# Patient Record
Sex: Female | Born: 1955
Health system: Southern US, Community
[De-identification: ages and names within clinical notes are randomized; demographics above are authoritative.]

## PROBLEM LIST (undated history)

## (undated) DIAGNOSIS — M1990 Unspecified osteoarthritis, unspecified site: Secondary | ICD-10-CM

## (undated) DIAGNOSIS — I2699 Other pulmonary embolism without acute cor pulmonale: Secondary | ICD-10-CM

## (undated) DIAGNOSIS — K219 Gastro-esophageal reflux disease without esophagitis: Secondary | ICD-10-CM

## (undated) DIAGNOSIS — I10 Essential (primary) hypertension: Secondary | ICD-10-CM

## (undated) DIAGNOSIS — T7840XA Allergy, unspecified, initial encounter: Secondary | ICD-10-CM

## (undated) DIAGNOSIS — E785 Hyperlipidemia, unspecified: Secondary | ICD-10-CM

## (undated) DIAGNOSIS — Z8701 Personal history of pneumonia (recurrent): Secondary | ICD-10-CM

## (undated) HISTORY — DX: Hyperlipidemia, unspecified: E78.5

## (undated) HISTORY — DX: Allergy, unspecified, initial encounter: T78.40XA

## (undated) HISTORY — DX: Essential (primary) hypertension: I10

## (undated) HISTORY — DX: Gastro-esophageal reflux disease without esophagitis: K21.9

## (undated) HISTORY — DX: Unspecified osteoarthritis, unspecified site: M19.90

---
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
## 1992-05-02 HISTORY — PX: ANKLE GANGLION CYST EXCISION: SHX1148

## 1994-05-02 HISTORY — PX: UMBILICAL HERNIA REPAIR: SHX196

## 1994-05-02 HISTORY — PX: TUBAL LIGATION: SHX77

## 1996-05-02 HISTORY — PX: BACK SURGERY: SHX140

## 2001-05-23 ENCOUNTER — Encounter: Admission: RE | Admit: 2001-05-23 | Discharge: 2001-05-23 | Payer: Self-pay

## 2001-05-30 ENCOUNTER — Encounter: Admission: RE | Admit: 2001-05-30 | Discharge: 2001-05-30 | Payer: Self-pay

## 2001-06-11 ENCOUNTER — Encounter: Admission: RE | Admit: 2001-06-11 | Discharge: 2001-06-11 | Payer: Self-pay | Admitting: Internal Medicine

## 2002-03-15 ENCOUNTER — Encounter: Admission: RE | Admit: 2002-03-15 | Discharge: 2002-03-15 | Payer: Self-pay | Admitting: Internal Medicine

## 2003-03-25 ENCOUNTER — Emergency Department (HOSPITAL_COMMUNITY): Admission: AD | Admit: 2003-03-25 | Discharge: 2003-03-25 | Payer: Self-pay | Admitting: Family Medicine

## 2006-03-02 ENCOUNTER — Ambulatory Visit: Payer: Self-pay | Admitting: Family Medicine

## 2006-03-20 ENCOUNTER — Ambulatory Visit: Payer: Self-pay | Admitting: Family Medicine

## 2006-05-17 ENCOUNTER — Other Ambulatory Visit: Admission: RE | Admit: 2006-05-17 | Discharge: 2006-05-17 | Payer: Self-pay | Admitting: Family Medicine

## 2006-05-17 ENCOUNTER — Ambulatory Visit: Payer: Self-pay | Admitting: Family Medicine

## 2006-05-17 ENCOUNTER — Encounter (INDEPENDENT_AMBULATORY_CARE_PROVIDER_SITE_OTHER): Payer: Self-pay | Admitting: Family Medicine

## 2006-05-23 ENCOUNTER — Ambulatory Visit (HOSPITAL_COMMUNITY): Admission: RE | Admit: 2006-05-23 | Discharge: 2006-05-23 | Payer: Self-pay | Admitting: Family Medicine

## 2006-06-02 ENCOUNTER — Encounter: Admission: RE | Admit: 2006-06-02 | Discharge: 2006-06-02 | Payer: Self-pay | Admitting: Internal Medicine

## 2006-06-14 ENCOUNTER — Ambulatory Visit: Payer: Self-pay | Admitting: Family Medicine

## 2006-06-21 ENCOUNTER — Ambulatory Visit: Payer: Self-pay | Admitting: Family Medicine

## 2006-08-08 ENCOUNTER — Ambulatory Visit: Payer: Self-pay | Admitting: Family Medicine

## 2006-10-23 ENCOUNTER — Ambulatory Visit: Payer: Self-pay | Admitting: Family Medicine

## 2007-01-29 ENCOUNTER — Ambulatory Visit: Payer: Self-pay | Admitting: Internal Medicine

## 2007-01-29 ENCOUNTER — Encounter (INDEPENDENT_AMBULATORY_CARE_PROVIDER_SITE_OTHER): Payer: Self-pay | Admitting: Nurse Practitioner

## 2007-01-29 LAB — CONVERTED CEMR LAB
Cholesterol: 158 mg/dL (ref 0–200)
HDL: 40 mg/dL (ref >39)
LDL Cholesterol: 94 mg/dL (ref 0–99)
Total CHOL/HDL Ratio: 4
Triglycerides: 118 mg/dL (ref <150)
VLDL: 24 mg/dL (ref 0–40)

## 2007-05-29 ENCOUNTER — Ambulatory Visit: Payer: Self-pay | Admitting: Internal Medicine

## 2007-11-29 ENCOUNTER — Emergency Department (HOSPITAL_COMMUNITY): Admission: EM | Admit: 2007-11-29 | Discharge: 2007-11-29 | Payer: Self-pay | Admitting: Family Medicine

## 2007-12-13 ENCOUNTER — Ambulatory Visit: Payer: Self-pay | Admitting: Internal Medicine

## 2007-12-13 LAB — CONVERTED CEMR LAB
ALT: <8 units/L (ref 0–35)
AST: 11 units/L (ref 0–37)
Albumin: 4.2 g/dL (ref 3.5–5.2)
Alkaline Phosphatase: 89 units/L (ref 39–117)
BUN: 19 mg/dL (ref 6–23)
CO2: 20 meq/L (ref 19–32)
Calcium: 10 mg/dL (ref 8.4–10.5)
Chloride: 102 meq/L (ref 96–112)
Cholesterol: 185 mg/dL (ref 0–200)
Creatinine, Ser: 0.92 mg/dL (ref 0.40–1.20)
Glucose, Bld: 181 mg/dL — ABNORMAL HIGH (ref 70–99)
HDL: 36 mg/dL — ABNORMAL LOW (ref >39)
LDL Cholesterol: 102 mg/dL — ABNORMAL HIGH (ref 0–99)
Potassium: 5.1 meq/L (ref 3.5–5.3)
Sodium: 140 meq/L (ref 135–145)
Total Bilirubin: 0.4 mg/dL (ref 0.3–1.2)
Total CHOL/HDL Ratio: 5.1
Total Protein: 7.4 g/dL (ref 6.0–8.3)
Triglycerides: 234 mg/dL — ABNORMAL HIGH (ref <150)
VLDL: 47 mg/dL — ABNORMAL HIGH (ref 0–40)

## 2008-05-10 ENCOUNTER — Emergency Department (HOSPITAL_COMMUNITY): Admission: EM | Admit: 2008-05-10 | Discharge: 2008-05-10 | Payer: Self-pay | Admitting: Family Medicine

## 2008-05-22 ENCOUNTER — Ambulatory Visit: Payer: Self-pay | Admitting: Internal Medicine

## 2008-06-11 ENCOUNTER — Ambulatory Visit: Payer: Self-pay | Admitting: Internal Medicine

## 2009-06-12 ENCOUNTER — Encounter (INDEPENDENT_AMBULATORY_CARE_PROVIDER_SITE_OTHER): Payer: Self-pay | Admitting: Adult Health

## 2009-06-12 ENCOUNTER — Ambulatory Visit: Payer: Self-pay | Admitting: Internal Medicine

## 2009-06-12 LAB — CONVERTED CEMR LAB
ALT: <8 units/L (ref 0–35)
AST: 15 units/L (ref 0–37)
Albumin: 4.1 g/dL (ref 3.5–5.2)
Alkaline Phosphatase: 77 units/L (ref 39–117)
BUN: 18 mg/dL (ref 6–23)
CO2: 24 meq/L (ref 19–32)
Calcium: 9.4 mg/dL (ref 8.4–10.5)
Chloride: 102 meq/L (ref 96–112)
Cholesterol: 152 mg/dL (ref 0–200)
Creatinine, Ser: 0.9 mg/dL (ref 0.40–1.20)
Glucose, Bld: 93 mg/dL (ref 70–99)
HDL: 34 mg/dL — ABNORMAL LOW (ref >39)
Hgb A1c MFr Bld: 6.5 % — ABNORMAL HIGH (ref 4.6–6.1)
LDL Cholesterol: 92 mg/dL (ref 0–99)
Potassium: 4.7 meq/L (ref 3.5–5.3)
Sodium: 138 meq/L (ref 135–145)
Total Bilirubin: 0.3 mg/dL (ref 0.3–1.2)
Total CHOL/HDL Ratio: 4.5
Total Protein: 7.4 g/dL (ref 6.0–8.3)
Triglycerides: 129 mg/dL (ref <150)
VLDL: 26 mg/dL (ref 0–40)

## 2009-06-19 ENCOUNTER — Encounter: Admission: RE | Admit: 2009-06-19 | Discharge: 2009-06-19 | Payer: Self-pay | Admitting: Internal Medicine

## 2009-09-09 ENCOUNTER — Ambulatory Visit: Payer: Self-pay | Admitting: Internal Medicine

## 2009-09-09 ENCOUNTER — Encounter (INDEPENDENT_AMBULATORY_CARE_PROVIDER_SITE_OTHER): Payer: Self-pay | Admitting: Adult Health

## 2009-09-09 LAB — CONVERTED CEMR LAB: Microalb, Ur: 0.5 mg/dL (ref 0.00–1.89)

## 2009-10-26 ENCOUNTER — Ambulatory Visit: Payer: Self-pay | Admitting: Family Medicine

## 2010-02-12 ENCOUNTER — Emergency Department (HOSPITAL_COMMUNITY): Admission: EM | Admit: 2010-02-12 | Discharge: 2010-02-12 | Payer: Self-pay | Admitting: Emergency Medicine

## 2010-04-20 ENCOUNTER — Encounter (INDEPENDENT_AMBULATORY_CARE_PROVIDER_SITE_OTHER): Payer: Self-pay | Admitting: *Deleted

## 2010-04-20 LAB — CONVERTED CEMR LAB
BUN: 19 mg/dL (ref 6–23)
CO2: 22 meq/L (ref 19–32)
Calcium: 9.4 mg/dL (ref 8.4–10.5)
Chloride: 106 meq/L (ref 96–112)
Creatinine, Ser: 0.81 mg/dL (ref 0.40–1.20)
Glucose, Bld: 102 mg/dL — ABNORMAL HIGH (ref 70–99)
Potassium: 4.9 meq/L (ref 3.5–5.3)
Sodium: 140 meq/L (ref 135–145)

## 2010-06-27 ENCOUNTER — Other Ambulatory Visit: Payer: Self-pay | Admitting: Internal Medicine

## 2010-06-29 NOTE — Telephone Encounter (Signed)
This is a [facility] pt not [facility], please deny all requests

## 2010-08-02 ENCOUNTER — Inpatient Hospital Stay (INDEPENDENT_AMBULATORY_CARE_PROVIDER_SITE_OTHER)
Admission: RE | Admit: 2010-08-02 | Discharge: 2010-08-02 | Disposition: A | Discharge status: Home / Self Care | Payer: Medicare Other | Source: Ambulatory Visit | Attending: Emergency Medicine | Admitting: Emergency Medicine

## 2010-08-02 DIAGNOSIS — Z760 Encounter for issue of repeat prescription: Secondary | ICD-10-CM

## 2010-08-02 DIAGNOSIS — R6889 Other general symptoms and signs: Secondary | ICD-10-CM

## 2011-03-11 ENCOUNTER — Other Ambulatory Visit (HOSPITAL_COMMUNITY)
Admission: RE | Admit: 2011-03-11 | Discharge: 2011-03-11 | Disposition: A | Discharge status: Home / Self Care | Payer: Medicare Other | Source: Ambulatory Visit | Attending: Family Medicine | Admitting: Family Medicine

## 2011-03-11 ENCOUNTER — Other Ambulatory Visit: Payer: Self-pay | Admitting: Family Medicine

## 2011-03-11 ENCOUNTER — Other Ambulatory Visit (HOSPITAL_COMMUNITY): Payer: Self-pay | Admitting: Family Medicine

## 2011-03-11 DIAGNOSIS — Z1231 Encounter for screening mammogram for malignant neoplasm of breast: Secondary | ICD-10-CM

## 2011-03-11 DIAGNOSIS — Z124 Encounter for screening for malignant neoplasm of cervix: Secondary | ICD-10-CM | POA: Insufficient documentation

## 2011-03-14 ENCOUNTER — Encounter: Payer: Self-pay | Admitting: Gastroenterology

## 2011-03-21 ENCOUNTER — Ambulatory Visit (AMBULATORY_SURGERY_CENTER): Payer: Medicare Other | Admitting: *Deleted

## 2011-03-21 VITALS — Ht 66.5 in | Wt 224.0 lb

## 2011-03-21 DIAGNOSIS — Z1211 Encounter for screening for malignant neoplasm of colon: Secondary | ICD-10-CM

## 2011-03-21 MED ORDER — PEG-KCL-NACL-NASULF-NA ASC-C 100 G PO SOLR: ORAL | Status: DC

## 2011-03-30 ENCOUNTER — Encounter: Payer: Self-pay | Admitting: Gastroenterology

## 2011-03-30 ENCOUNTER — Ambulatory Visit (AMBULATORY_SURGERY_CENTER): Payer: Medicare Other | Admitting: Gastroenterology

## 2011-03-30 VITALS — BP 146/77 | HR 78 | Temp 97.9°F | Resp 18 | Ht 66.5 in | Wt 224.0 lb

## 2011-03-30 DIAGNOSIS — K5289 Other specified noninfective gastroenteritis and colitis: Secondary | ICD-10-CM

## 2011-03-30 DIAGNOSIS — Z1211 Encounter for screening for malignant neoplasm of colon: Secondary | ICD-10-CM

## 2011-03-30 DIAGNOSIS — R933 Abnormal findings on diagnostic imaging of other parts of digestive tract: Secondary | ICD-10-CM

## 2011-03-30 DIAGNOSIS — K633 Ulcer of intestine: Secondary | ICD-10-CM

## 2011-03-30 LAB — GLUCOSE, CAPILLARY: Glucose-Capillary: 191 mg/dL — ABNORMAL HIGH (ref 70–99)

## 2011-03-30 MED ORDER — SODIUM CHLORIDE 0.9 % IV SOLN: 500.0000 mL | INTRAVENOUS | Status: DC

## 2011-03-30 NOTE — Patient Instructions (Signed)
FOLLOW THE INSTRUCTIONS ON THE GREEN AND BLUE POST PROCEDURE INSTRUCTION SHEETS.  CONTINUE YOUR MEDICATIONS.  AWAIT BIOPSY RESULTS.   WILL NEED A FOLLOW UP APPOINTMENT TO DISCUSS FINDINGS WITH DR. Christella Hartigan.

## 2011-03-30 NOTE — Progress Notes (Signed)
Patient did not experience any of the following events: a burn prior to discharge; a fall within the facility; wrong site/side/patient/procedure/implant event; or a hospital transfer or hospital admission upon discharge from the facility. (G8907) Patient did not have preoperative order for IV antibiotic SSI prophylaxis. (G8918)  

## 2011-03-31 ENCOUNTER — Telehealth: Payer: Self-pay | Admitting: *Deleted

## 2011-03-31 NOTE — Telephone Encounter (Signed)
No id on voicemail, no message left

## 2011-04-05 ENCOUNTER — Other Ambulatory Visit: Payer: Medicare Other | Admitting: Gastroenterology

## 2011-04-05 ENCOUNTER — Telehealth: Payer: Self-pay | Admitting: Gastroenterology

## 2011-04-05 NOTE — Telephone Encounter (Signed)
Patt please call her. Biopsies showed chronic inflammation of her colon. I will discuss this in more detail at her office visit in the end of December.  Natalie, no result note is needed

## 2011-04-05 NOTE — Telephone Encounter (Signed)
Left message on machine to call back  

## 2011-04-05 NOTE — Telephone Encounter (Signed)
Pt aware.

## 2011-04-12 ENCOUNTER — Ambulatory Visit (HOSPITAL_COMMUNITY)
Admission: RE | Admit: 2011-04-12 | Discharge: 2011-04-12 | Disposition: A | Discharge status: Home / Self Care | Payer: Medicare Other | Source: Ambulatory Visit | Attending: Family Medicine | Admitting: Family Medicine

## 2011-04-12 DIAGNOSIS — Z1231 Encounter for screening mammogram for malignant neoplasm of breast: Secondary | ICD-10-CM | POA: Insufficient documentation

## 2011-04-27 ENCOUNTER — Ambulatory Visit: Payer: Medicare Other | Admitting: Gastroenterology

## 2011-05-10 ENCOUNTER — Ambulatory Visit (INDEPENDENT_AMBULATORY_CARE_PROVIDER_SITE_OTHER): Payer: Medicare Other | Admitting: Gastroenterology

## 2011-05-10 ENCOUNTER — Encounter: Payer: Self-pay | Admitting: Gastroenterology

## 2011-05-10 VITALS — BP 110/70 | HR 83 | Ht 67.0 in | Wt 228.4 lb

## 2011-05-10 DIAGNOSIS — I10 Essential (primary) hypertension: Secondary | ICD-10-CM

## 2011-05-10 DIAGNOSIS — E1159 Type 2 diabetes mellitus with other circulatory complications: Secondary | ICD-10-CM | POA: Insufficient documentation

## 2011-05-10 DIAGNOSIS — R933 Abnormal findings on diagnostic imaging of other parts of digestive tract: Secondary | ICD-10-CM

## 2011-05-10 DIAGNOSIS — K219 Gastro-esophageal reflux disease without esophagitis: Secondary | ICD-10-CM

## 2011-05-10 NOTE — Progress Notes (Signed)
Review of pertinent gastrointestinal problems: 1. incidentally noted a sending colon, terminal ileal inflammation on colonoscopy done for routine screening November 2012. Biopsy showed active, chronic colitis but no inflammation in the terminal ileum microscopically. Likely NSAID related, she takes 4 Aleve a day   HPI: This is a  very pleasant 56 year old woman whom I last saw time of routine screening examination about 2 months ago. See those results summarized above.  Colonoscopy 2 months ago for routine screening.    She had alternating bowels for many years, but for the past 2 years she will go 2-3 times a week.  No real diarrhea.  She has intermittent epigastric discomforts, also left sided pains;  She will pass gas or have a BM and feels better.  She takes 4 alleve a day for many many years.  Tylenol doesn't help very much.  Severe knee and back pains.  Review of systems: Pertinent positive and negative review of systems were noted in the above HPI section. Complete review of systems was performed and was otherwise normal.    Past Medical History  Diagnosis Date  . Allergy     seasonal  . Arthritis   . Diabetes mellitus   . GERD (gastroesophageal reflux disease)   . Hyperlipidemia   . Hypertension     Past Surgical History  Procedure Date  . Ankle fracture surgery 1974    left and pinned then pins removed  . Ankle ganglion cyst excision 1994    right  . Back surgery 1998    discectomy and then fusion  . Umbilical hernia repair 1996  . Tubal ligation 1996  . Tonsillectomy 1972    Current Outpatient Prescriptions  Medication Sig Dispense Refill  . benazepril-hydrochlorthiazide (LOTENSIN HCT) 20-25 MG per tablet Take 1 tablet by mouth daily.        Marland Kitchen glimepiride (AMARYL) 2 MG tablet Take 2 mg by mouth daily before breakfast.        . [facility] Lanius CAPS Take 1 capsule by mouth daily.        . metFORMIN (GLUCOPHAGE) 850 MG tablet Take 850 mg by mouth 2 (two) times daily  with a meal.        . naproxen sodium (ANAPROX) 220 MG tablet Take 220 mg by mouth 2 (two) times daily with a meal.        . pravastatin (PRAVACHOL) 40 MG tablet Take 40 mg by mouth daily.        . traMADol (ULTRAM) 50 MG tablet Take 100 mg by mouth every 8 (eight) hours as needed. Maximum dose= 8 tablets per day         Allergies as of 05/10/2011  . (No Known Allergies)    Family History  Problem Relation Age of Onset  . Colon polyps Father   . Diabetes Father     mother  . Heart disease Father   . Heart murmur Mother     History   Social History  . Marital Status: Married    Spouse Name: N/A    Number of Children: 0  . Years of Education: N/A   Occupational History  . retired    Social History Main Topics  . Smoking status: Current Everyday Smoker -- 1.0 packs/day    Types: Cigarettes  . Smokeless tobacco: Never Used  . Alcohol Use: No  . Drug Use: No  . Sexually Active: Not on file   Other Topics Concern  . Not on file  Social History Narrative  . No narrative on file       Physical Exam: BP 110/70  Pulse 83  Ht 5\' 7"  (1.702 m)  Wt 228 lb 6.4 oz (103.602 kg)  BMI 35.77 kg/m2  SpO2 97% Constitutional: generally well-appearing Psychiatric: alert and oriented x3 Eyes: extraocular movements intact Mouth: oral pharynx moist, no lesions Neck: supple no lymphadenopathy Cardiovascular: heart regular rate and rhythm Lungs: clear to auscultation bilaterally Abdomen: soft, nontender, nondistended, no obvious ascites, no peritoneal signs, normal bowel sounds Extremities: no lower extremity edema bilaterally Skin: no lesions on visible extremities    Assessment and plan: 56 y.o. 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 She really has no symptoms of inflammatory bowel disease. She also told me she has intermittent GERD. Her chronic cigarette  smoking is probably playing a role and her NSAIDs also play a role. She is going to change the way she is taking her proton pump inhibitor.

## 2011-05-10 NOTE — Patient Instructions (Addendum)
One of your biggest health concerns is your smoking.  This increases your risk for most cancers and serious cardiovascular diseases such as strokes, heart attacks.  You should try your best to stop.  If you need assistance, please contact your PCP or Smoking Cessation Class at [facility] [phone]) or [facility] Quit-Line (1-800-QUIT-NOW). The alleve is probably irritating your colon, take as little as possible. You should change the way you are taking your antiacid medicine prilosec so that you are taking it 20-30 minutes prior to a decent meal as that is the way the pill is designed to work most effectively. Please start taking citrucel (orange flavored) powder fiber supplement.  This may cause some bloating at first but that usually goes away. Begin with a small spoonful and work your way up to a large, heaping spoonful daily over a week.

## 2012-02-02 ENCOUNTER — Other Ambulatory Visit (HOSPITAL_COMMUNITY): Payer: Self-pay | Admitting: Orthopedic Surgery

## 2012-02-03 ENCOUNTER — Encounter (HOSPITAL_COMMUNITY): Payer: Self-pay | Admitting: Pharmacy Technician

## 2012-02-07 ENCOUNTER — Encounter (HOSPITAL_COMMUNITY)
Admission: RE | Admit: 2012-02-07 | Discharge: 2012-02-07 | Disposition: A | Discharge status: Home / Self Care | Payer: Medicare Other | Source: Ambulatory Visit | Attending: Orthopedic Surgery | Admitting: Orthopedic Surgery

## 2012-02-07 ENCOUNTER — Encounter (HOSPITAL_COMMUNITY): Payer: Self-pay

## 2012-02-07 HISTORY — DX: Personal history of pneumonia (recurrent): Z87.01

## 2012-02-07 LAB — CBC
HCT: 36.9 % (ref 36.0–46.0)
MCH: 24.9 pg — ABNORMAL LOW (ref 26.0–34.0)
MCV: 81.5 fL (ref 78.0–100.0)
Platelets: 341 10*3/uL (ref 150–400)
RBC: 4.53 MIL/uL (ref 3.87–5.11)

## 2012-02-07 LAB — URINALYSIS, ROUTINE W REFLEX MICROSCOPIC
Glucose, UA: NEGATIVE mg/dL
Ketones, ur: NEGATIVE mg/dL
Leukocytes, UA: NEGATIVE
Nitrite: NEGATIVE
Specific Gravity, Urine: 1.022 (ref 1.005–1.030)
pH: 5 (ref 5.0–8.0)

## 2012-02-07 LAB — BASIC METABOLIC PANEL
CO2: 24 mEq/L (ref 19–32)
Calcium: 9.6 mg/dL (ref 8.4–10.5)
Chloride: 102 mEq/L (ref 96–112)
Creatinine, Ser: 1.05 mg/dL (ref 0.50–1.10)
Glucose, Bld: 124 mg/dL — ABNORMAL HIGH (ref 70–99)
Sodium: 137 mEq/L (ref 135–145)

## 2012-02-07 LAB — TYPE AND SCREEN

## 2012-02-07 NOTE — Pre-Procedure Instructions (Signed)
22 Kaitlin Cantrell  02/07/2012   Your procedure is scheduled on:  October 15  Report to [facility] at 05:30 AM.  Call this number if you have problems the morning of surgery: [phone]   Remember:   Do not eat or drink:After Midnight.  Take these medicines the morning of surgery with A SIP OF WATER: Hydrocodone (if needed), Omeprazole, Tramadol (if needed)   STOP Naproxen (aleve), Krill Oil, Aspirin After today   Do not wear jewelry, make-up or nail polish.  Do not wear lotions, powders, or perfumes. You may wear deodorant.  Do not shave 48 hours prior to surgery. Men may shave face and neck.  Do not bring valuables to the hospital.  Contacts, dentures or bridgework may not be worn into surgery.  Leave suitcase in the car. After surgery it may be brought to your room.  For patients admitted to the hospital, checkout time is 11:00 AM the day of discharge.   Special Instructions: Shower using CHG 2 nights before surgery and the night before surgery.  If you shower the day of surgery use CHG.  Use special wash - you have one bottle of CHG for all showers.  You should use approximately 1/3 of the bottle for each shower.   Please read over the following fact sheets that you were given: Pain Booklet, Coughing and Deep Breathing, Blood Transfusion Information, Total Joint Packet and MRSA Information

## 2012-02-08 LAB — URINE CULTURE: Colony Count: NO GROWTH

## 2012-02-08 NOTE — Consult Note (Addendum)
Anesthesia chart review: Patient is a 56 year old female scheduled for left total knee arthroplasty by Dr. August Saucer on 02/14/2012. History includes obesity, smoking, diabetes mellitus type 2, hyperlipidemia, GERD, arthritis, hypertension, history pneumonia.  PCP is listed as Dr. Norberto Sorenson of [facility], which is recently closed.    Labs noted.  PTT on the day of surgery.  CXR on 02/07/12 showed lungs clear.  EKG on 02/07/12 showed NSR, LAD, anteroseptal infarct (age undetermined).  Currently, there are no comparison EKGs available.  No CV symptoms were documented at her PAT visit.  I reviewed above with Anesthesiologist Dr. Katrinka Blazing who agrees that if patient is asymptomatic from a CV standpoint then would plan to proceed.  I left patient a voicemail to call me to clinically correlate, otherwise she will be evaluated by her assigned anesthesiologist on the day of surgery.   Shonna Chock, PA-C 02/08/12 1147  Addendum:  Patient called.  She denies chest pain and SOB at rest.  She has mild DOE with moderate activity such as going up a hill.  She moves slower due to her knee pain, but otherwise feels she is fairly active.  She is able to do her own shopping and recently was able to prepare a meal/clean-up for sixteen people.  She denies known cardiac history or cardiac testing such as stress or echo.  She is compliant with her medications is looking in to getting established with [facility] for primary care since [facility] closed.  Anticipate she can proceed.

## 2012-02-13 MED ORDER — CHLORHEXIDINE GLUCONATE 4 % EX LIQD: 60.0000 mL | Freq: Once | CUTANEOUS | Status: DC

## 2012-02-13 MED ORDER — CEFAZOLIN SODIUM-DEXTROSE 2-3 GM-% IV SOLR
2.0000 g | INTRAVENOUS | Status: DC
  Filled 2012-02-13: qty 50

## 2012-02-14 ENCOUNTER — Inpatient Hospital Stay (HOSPITAL_COMMUNITY)
Admission: RE | Admit: 2012-02-14 | Discharge: 2012-02-18 | DRG: 470 | Disposition: A | Discharge status: Home Health | Payer: Medicare Other | Source: Ambulatory Visit | Attending: Orthopedic Surgery | Admitting: Orthopedic Surgery

## 2012-02-14 ENCOUNTER — Encounter (HOSPITAL_COMMUNITY): Payer: Self-pay | Admitting: Vascular Surgery

## 2012-02-14 ENCOUNTER — Encounter (HOSPITAL_COMMUNITY)
Admission: RE | Disposition: A | Discharge status: Home Health | Payer: Self-pay | Source: Ambulatory Visit | Attending: Orthopedic Surgery

## 2012-02-14 ENCOUNTER — Encounter (HOSPITAL_COMMUNITY): Payer: Self-pay | Admitting: *Deleted

## 2012-02-14 ENCOUNTER — Inpatient Hospital Stay (HOSPITAL_COMMUNITY): Payer: Medicare Other | Admitting: Vascular Surgery

## 2012-02-14 DIAGNOSIS — F17200 Nicotine dependence, unspecified, uncomplicated: Secondary | ICD-10-CM | POA: Diagnosis present

## 2012-02-14 DIAGNOSIS — Z833 Family history of diabetes mellitus: Secondary | ICD-10-CM

## 2012-02-14 DIAGNOSIS — Z7982 Long term (current) use of aspirin: Secondary | ICD-10-CM

## 2012-02-14 DIAGNOSIS — E785 Hyperlipidemia, unspecified: Secondary | ICD-10-CM | POA: Diagnosis present

## 2012-02-14 DIAGNOSIS — E119 Type 2 diabetes mellitus without complications: Secondary | ICD-10-CM | POA: Diagnosis present

## 2012-02-14 DIAGNOSIS — I10 Essential (primary) hypertension: Secondary | ICD-10-CM | POA: Diagnosis present

## 2012-02-14 DIAGNOSIS — M1710 Unilateral primary osteoarthritis, unspecified knee: Principal | ICD-10-CM | POA: Diagnosis present

## 2012-02-14 DIAGNOSIS — M1711 Unilateral primary osteoarthritis, right knee: Secondary | ICD-10-CM | POA: Diagnosis present

## 2012-02-14 DIAGNOSIS — K219 Gastro-esophageal reflux disease without esophagitis: Secondary | ICD-10-CM | POA: Diagnosis present

## 2012-02-14 HISTORY — PX: KNEE ARTHROPLASTY: SHX992

## 2012-02-14 LAB — GLUCOSE, CAPILLARY
Glucose-Capillary: 140 mg/dL — ABNORMAL HIGH (ref 70–99)
Glucose-Capillary: 179 mg/dL — ABNORMAL HIGH (ref 70–99)
Glucose-Capillary: 158 mg/dL — ABNORMAL HIGH (ref 70–99)

## 2012-02-14 SURGERY — ARTHROPLASTY, KNEE, TOTAL, USING IMAGELESS COMPUTER-ASSISTED NAVIGATION
Anesthesia: General | Site: Knee | Laterality: Left | Wound class: Clean

## 2012-02-14 MED ORDER — HYDROMORPHONE 0.3 MG/ML IV SOLN
INTRAVENOUS | Status: DC
  Administered 2012-02-14: 3.9 mg via INTRAVENOUS
  Administered 2012-02-15: 01:00:00 via INTRAVENOUS
  Administered 2012-02-15: 5.3 mg via INTRAVENOUS
  Administered 2012-02-15: 08:00:00 via INTRAVENOUS
  Administered 2012-02-15: 3.8 mg via INTRAVENOUS
  Filled 2012-02-14 (×3): qty 25

## 2012-02-14 MED ORDER — METHOCARBAMOL 100 MG/ML IJ SOLN
500.0000 mg | Freq: Four times a day (QID) | INTRAVENOUS | Status: DC | PRN
  Administered 2012-02-14: 500 mg via INTRAVENOUS
  Filled 2012-02-14: qty 5

## 2012-02-14 MED ORDER — PATIENT'S GUIDE TO USING COUMADIN BOOK
Freq: Once | Status: DC
  Filled 2012-02-14: qty 1

## 2012-02-14 MED ORDER — LACTATED RINGERS IV SOLN
INTRAVENOUS | Status: DC | PRN
  Administered 2012-02-14 (×2): via INTRAVENOUS

## 2012-02-14 MED ORDER — NALOXONE HCL 0.4 MG/ML IJ SOLN
0.4000 mg | INTRAMUSCULAR | Status: DC | PRN
  Filled 2012-02-14: qty 1

## 2012-02-14 MED ORDER — VANCOMYCIN HCL IN DEXTROSE 1-5 GM/200ML-% IV SOLN
INTRAVENOUS | Status: AC
  Filled 2012-02-14: qty 200

## 2012-02-14 MED ORDER — LIDOCAINE HCL 4 % MT SOLN
OROMUCOSAL | Status: DC | PRN
  Administered 2012-02-14: 4 mL via TOPICAL

## 2012-02-14 MED ORDER — SODIUM CHLORIDE 0.9 % IJ SOLN: 9.0000 mL | INTRAMUSCULAR | Status: DC | PRN

## 2012-02-14 MED ORDER — METFORMIN HCL 850 MG PO TABS
850.0000 mg | ORAL_TABLET | Freq: Two times a day (BID) | ORAL | Status: DC
  Administered 2012-02-14 – 2012-02-18 (×8): 850 mg via ORAL
  Filled 2012-02-14 (×11): qty 1

## 2012-02-14 MED ORDER — DIPHENHYDRAMINE HCL 50 MG/ML IJ SOLN
12.5000 mg | Freq: Four times a day (QID) | INTRAMUSCULAR | Status: DC | PRN
  Administered 2012-02-14: 12.5 mg via INTRAVENOUS
  Filled 2012-02-14: qty 0.25
  Filled 2012-02-14: qty 1

## 2012-02-14 MED ORDER — MIDAZOLAM HCL 2 MG/2ML IJ SOLN
0.5000 mg | Freq: Once | INTRAMUSCULAR | Status: AC | PRN
  Administered 2012-02-14: 0.5 mg via INTRAVENOUS

## 2012-02-14 MED ORDER — BUPIVACAINE-EPINEPHRINE (PF) 0.5% -1:200000 IJ SOLN
INTRAMUSCULAR | Status: AC
  Filled 2012-02-14: qty 10

## 2012-02-14 MED ORDER — MIDAZOLAM HCL 2 MG/2ML IJ SOLN
INTRAMUSCULAR | Status: AC
  Administered 2012-02-14: 0.5 mg
  Filled 2012-02-14: qty 2

## 2012-02-14 MED ORDER — ACETAMINOPHEN 650 MG RE SUPP: 650.0000 mg | Freq: Four times a day (QID) | RECTAL | Status: DC | PRN

## 2012-02-14 MED ORDER — PROPOFOL 10 MG/ML IV BOLUS
INTRAVENOUS | Status: DC | PRN
  Administered 2012-02-14: 200 mg via INTRAVENOUS

## 2012-02-14 MED ORDER — DOCUSATE SODIUM 100 MG PO CAPS
100.0000 mg | ORAL_CAPSULE | Freq: Two times a day (BID) | ORAL | Status: DC
  Administered 2012-02-14 – 2012-02-18 (×9): 100 mg via ORAL
  Filled 2012-02-14 (×10): qty 1

## 2012-02-14 MED ORDER — WARFARIN - PHARMACIST DOSING INPATIENT: Freq: Every day | Status: DC

## 2012-02-14 MED ORDER — HYDROMORPHONE HCL PF 1 MG/ML IJ SOLN
0.2500 mg | INTRAMUSCULAR | Status: DC | PRN
  Administered 2012-02-14 (×3): 0.5 mg via INTRAVENOUS

## 2012-02-14 MED ORDER — HYDROCHLOROTHIAZIDE 25 MG PO TABS
25.0000 mg | ORAL_TABLET | Freq: Every day | ORAL | Status: DC
  Administered 2012-02-14 – 2012-02-18 (×4): 25 mg via ORAL
  Filled 2012-02-14 (×5): qty 1

## 2012-02-14 MED ORDER — POTASSIUM CHLORIDE IN NACL 20-0.9 MEQ/L-% IV SOLN
INTRAVENOUS | Status: AC
  Administered 2012-02-14: 20:00:00 via INTRAVENOUS
  Filled 2012-02-14 (×4): qty 1000

## 2012-02-14 MED ORDER — GLIMEPIRIDE 2 MG PO TABS
2.0000 mg | ORAL_TABLET | Freq: Two times a day (BID) | ORAL | Status: DC
  Administered 2012-02-14 – 2012-02-18 (×8): 2 mg via ORAL
  Filled 2012-02-14 (×11): qty 1

## 2012-02-14 MED ORDER — BUPIVACAINE-EPINEPHRINE 0.5% -1:200000 IJ SOLN
INTRAMUSCULAR | Status: DC | PRN
  Administered 2012-02-14: 20 mL

## 2012-02-14 MED ORDER — ONDANSETRON HCL 4 MG/2ML IJ SOLN
INTRAMUSCULAR | Status: DC | PRN
  Administered 2012-02-14: 4 mg via INTRAVENOUS

## 2012-02-14 MED ORDER — PANTOPRAZOLE SODIUM 40 MG PO TBEC
40.0000 mg | DELAYED_RELEASE_TABLET | Freq: Every day | ORAL | Status: DC
  Administered 2012-02-14 – 2012-02-18 (×5): 40 mg via ORAL
  Filled 2012-02-14 (×5): qty 1

## 2012-02-14 MED ORDER — HYDROMORPHONE 0.3 MG/ML IV SOLN
INTRAVENOUS | Status: AC
  Administered 2012-02-14: 12:00:00
  Filled 2012-02-14: qty 25

## 2012-02-14 MED ORDER — MEPERIDINE HCL 25 MG/ML IJ SOLN: 6.2500 mg | INTRAMUSCULAR | Status: DC | PRN

## 2012-02-14 MED ORDER — METOCLOPRAMIDE HCL 10 MG PO TABS: 5.0000 mg | ORAL_TABLET | Freq: Three times a day (TID) | ORAL | Status: DC | PRN

## 2012-02-14 MED ORDER — LIDOCAINE HCL (CARDIAC) 20 MG/ML IV SOLN
INTRAVENOUS | Status: DC | PRN
  Administered 2012-02-14: 10 mg via INTRAVENOUS

## 2012-02-14 MED ORDER — PROMETHAZINE HCL 25 MG/ML IJ SOLN: 6.2500 mg | INTRAMUSCULAR | Status: DC | PRN

## 2012-02-14 MED ORDER — OXYCODONE HCL 5 MG PO TABS
5.0000 mg | ORAL_TABLET | ORAL | Status: DC | PRN
  Administered 2012-02-15 – 2012-02-18 (×14): 10 mg via ORAL
  Filled 2012-02-14 (×14): qty 2

## 2012-02-14 MED ORDER — WARFARIN SODIUM 7.5 MG PO TABS
7.5000 mg | ORAL_TABLET | Freq: Once | ORAL | Status: AC
  Administered 2012-02-14: 7.5 mg via ORAL
  Filled 2012-02-14: qty 1

## 2012-02-14 MED ORDER — SIMVASTATIN 5 MG PO TABS
5.0000 mg | ORAL_TABLET | Freq: Every day | ORAL | Status: DC
  Administered 2012-02-14 – 2012-02-17 (×4): 5 mg via ORAL
  Filled 2012-02-14 (×7): qty 1

## 2012-02-14 MED ORDER — BENAZEPRIL HCL 40 MG PO TABS
40.0000 mg | ORAL_TABLET | Freq: Every evening | ORAL | Status: DC
  Administered 2012-02-14 – 2012-02-17 (×4): 40 mg via ORAL
  Filled 2012-02-14 (×6): qty 1

## 2012-02-14 MED ORDER — ONDANSETRON HCL 4 MG/2ML IJ SOLN: 4.0000 mg | Freq: Four times a day (QID) | INTRAMUSCULAR | Status: DC | PRN

## 2012-02-14 MED ORDER — OXYCODONE HCL 5 MG/5ML PO SOLN: 5.0000 mg | Freq: Once | ORAL | Status: DC | PRN

## 2012-02-14 MED ORDER — VANCOMYCIN HCL 1000 MG IV SOLR
1000.0000 mg | Freq: Two times a day (BID) | INTRAVENOUS | Status: DC
  Administered 2012-02-14: 1000 mg via INTRAVENOUS
  Filled 2012-02-14 (×3): qty 1000

## 2012-02-14 MED ORDER — ALBUTEROL SULFATE HFA 108 (90 BASE) MCG/ACT IN AERS
INHALATION_SPRAY | RESPIRATORY_TRACT | Status: DC | PRN
  Administered 2012-02-14 (×2): 2 via RESPIRATORY_TRACT

## 2012-02-14 MED ORDER — ACETAMINOPHEN 325 MG PO TABS
650.0000 mg | ORAL_TABLET | Freq: Four times a day (QID) | ORAL | Status: DC | PRN
  Administered 2012-02-14 – 2012-02-15 (×2): 650 mg via ORAL
  Filled 2012-02-14 (×2): qty 2

## 2012-02-14 MED ORDER — METOCLOPRAMIDE HCL 5 MG/ML IJ SOLN: 5.0000 mg | Freq: Three times a day (TID) | INTRAMUSCULAR | Status: DC | PRN

## 2012-02-14 MED ORDER — FENTANYL CITRATE 0.05 MG/ML IJ SOLN
INTRAMUSCULAR | Status: DC | PRN
  Administered 2012-02-14 (×3): 50 ug via INTRAVENOUS
  Administered 2012-02-14 (×2): 100 ug via INTRAVENOUS
  Administered 2012-02-14 (×7): 50 ug via INTRAVENOUS
  Administered 2012-02-14: 200 ug via INTRAVENOUS
  Administered 2012-02-14 (×2): 50 ug via INTRAVENOUS

## 2012-02-14 MED ORDER — DIPHENHYDRAMINE HCL 12.5 MG/5ML PO ELIX
12.5000 mg | ORAL_SOLUTION | Freq: Four times a day (QID) | ORAL | Status: DC | PRN
  Filled 2012-02-14: qty 5

## 2012-02-14 MED ORDER — GLYCOPYRROLATE 0.2 MG/ML IJ SOLN
INTRAMUSCULAR | Status: DC | PRN
  Administered 2012-02-14: 0.4 mg via INTRAVENOUS

## 2012-02-14 MED ORDER — HYDROMORPHONE HCL PF 1 MG/ML IJ SOLN
INTRAMUSCULAR | Status: AC
  Filled 2012-02-14: qty 1

## 2012-02-14 MED ORDER — ARTIFICIAL TEARS OP OINT
TOPICAL_OINTMENT | OPHTHALMIC | Status: DC | PRN
  Administered 2012-02-14: 1 via OPHTHALMIC

## 2012-02-14 MED ORDER — ONDANSETRON HCL 4 MG PO TABS: 4.0000 mg | ORAL_TABLET | Freq: Four times a day (QID) | ORAL | Status: DC | PRN

## 2012-02-14 MED ORDER — METHOCARBAMOL 500 MG PO TABS: 500.0000 mg | ORAL_TABLET | Freq: Four times a day (QID) | ORAL | Status: DC | PRN

## 2012-02-14 MED ORDER — MIDAZOLAM HCL 5 MG/5ML IJ SOLN
INTRAMUSCULAR | Status: DC | PRN
  Administered 2012-02-14: 2 mg via INTRAVENOUS

## 2012-02-14 MED ORDER — PHENYLEPHRINE HCL 10 MG/ML IJ SOLN: INTRAMUSCULAR | Status: DC | PRN

## 2012-02-14 MED ORDER — CLONIDINE HCL (ANALGESIA) 100 MCG/ML EP SOLN
150.0000 ug | Freq: Once | EPIDURAL | Status: AC
  Administered 2012-02-14: 1 mL via INTRA_ARTICULAR
  Filled 2012-02-14: qty 1.5

## 2012-02-14 MED ORDER — INSULIN ASPART 100 UNIT/ML ~~LOC~~ SOLN: 0.0000 [IU] | Freq: Every day | SUBCUTANEOUS | Status: DC

## 2012-02-14 MED ORDER — CELECOXIB 200 MG PO CAPS
200.0000 mg | ORAL_CAPSULE | Freq: Two times a day (BID) | ORAL | Status: DC
  Administered 2012-02-14 – 2012-02-18 (×9): 200 mg via ORAL
  Filled 2012-02-14 (×10): qty 1

## 2012-02-14 MED ORDER — MENTHOL 3 MG MT LOZG: 1.0000 | LOZENGE | OROMUCOSAL | Status: DC | PRN

## 2012-02-14 MED ORDER — NEOSTIGMINE METHYLSULFATE 1 MG/ML IJ SOLN
INTRAMUSCULAR | Status: DC | PRN
  Administered 2012-02-14: 3 mg via INTRAVENOUS

## 2012-02-14 MED ORDER — HYDROMORPHONE HCL PF 1 MG/ML IJ SOLN
INTRAMUSCULAR | Status: DC | PRN
  Administered 2012-02-14 (×2): 0.5 mg via INTRAVENOUS

## 2012-02-14 MED ORDER — PHENOL 1.4 % MT LIQD: 1.0000 | OROMUCOSAL | Status: DC | PRN

## 2012-02-14 MED ORDER — VECURONIUM BROMIDE 10 MG IV SOLR
INTRAVENOUS | Status: DC | PRN
  Administered 2012-02-14: 3 mg via INTRAVENOUS
  Administered 2012-02-14: 1 mg via INTRAVENOUS
  Administered 2012-02-14 (×2): 2 mg via INTRAVENOUS

## 2012-02-14 MED ORDER — ACETAMINOPHEN 10 MG/ML IV SOLN
INTRAVENOUS | Status: AC
  Filled 2012-02-14: qty 100

## 2012-02-14 MED ORDER — ROCURONIUM BROMIDE 100 MG/10ML IV SOLN
INTRAVENOUS | Status: DC | PRN
  Administered 2012-02-14: 50 mg via INTRAVENOUS

## 2012-02-14 MED ORDER — INSULIN ASPART 100 UNIT/ML ~~LOC~~ SOLN
0.0000 [IU] | Freq: Three times a day (TID) | SUBCUTANEOUS | Status: DC
  Administered 2012-02-14 – 2012-02-15 (×2): 2 [IU] via SUBCUTANEOUS
  Administered 2012-02-15: 3 [IU] via SUBCUTANEOUS
  Administered 2012-02-16: 2 [IU] via SUBCUTANEOUS
  Administered 2012-02-17 (×2): 3 [IU] via SUBCUTANEOUS

## 2012-02-14 MED ORDER — OXYCODONE HCL 5 MG PO TABS: 5.0000 mg | ORAL_TABLET | Freq: Once | ORAL | Status: DC | PRN

## 2012-02-14 MED ORDER — VANCOMYCIN HCL IN DEXTROSE 1-5 GM/200ML-% IV SOLN
1000.0000 mg | Freq: Two times a day (BID) | INTRAVENOUS | Status: AC
  Administered 2012-02-14: 1000 mg via INTRAVENOUS
  Filled 2012-02-14: qty 200

## 2012-02-14 MED ORDER — WARFARIN VIDEO: Freq: Once | Status: DC

## 2012-02-14 MED ORDER — ONDANSETRON HCL 4 MG/2ML IJ SOLN
4.0000 mg | Freq: Four times a day (QID) | INTRAMUSCULAR | Status: DC | PRN
  Filled 2012-02-14: qty 2

## 2012-02-14 MED ORDER — ACETAMINOPHEN 10 MG/ML IV SOLN
1000.0000 mg | Freq: Once | INTRAVENOUS | Status: AC
  Administered 2012-02-14: 1000 mg via INTRAVENOUS

## 2012-02-14 SURGICAL SUPPLY — 75 items
BANDAGE ELASTIC 4 VELCRO ST LF (GAUZE/BANDAGES/DRESSINGS) ×2 IMPLANT
BANDAGE ELASTIC 6 VELCRO ST LF (GAUZE/BANDAGES/DRESSINGS) ×2 IMPLANT
BANDAGE ESMARK 6X9 LF (GAUZE/BANDAGES/DRESSINGS) ×1 IMPLANT
BLADE SAG 18X100X1.27 (BLADE) ×2 IMPLANT
BLADE SAW SGTL 13.0X1.19X90.0M (BLADE) ×2 IMPLANT
BNDG COHESIVE 6X5 TAN STRL LF (GAUZE/BANDAGES/DRESSINGS) ×2 IMPLANT
BNDG ELASTIC 6X10 VLCR STRL LF (GAUZE/BANDAGES/DRESSINGS) ×6 IMPLANT
BNDG ESMARK 6X9 LF (GAUZE/BANDAGES/DRESSINGS) ×2
BOWL SMART MIX CTS (DISPOSABLE) ×2 IMPLANT
CEMENT HV SMART SET (Cement) ×4 IMPLANT
CLOTH BEACON ORANGE TIMEOUT ST (SAFETY) ×2 IMPLANT
COVER BACK TABLE 24X17X13 BIG (DRAPES) IMPLANT
COVER SURGICAL LIGHT HANDLE (MISCELLANEOUS) ×2 IMPLANT
CUFF TOURNIQUET SINGLE 34IN LL (TOURNIQUET CUFF) IMPLANT
CUFF TOURNIQUET SINGLE 44IN (TOURNIQUET CUFF) IMPLANT
DRAPE INCISE IOBAN 66X45 STRL (DRAPES) IMPLANT
DRAPE ORTHO SPLIT 77X108 STRL (DRAPES) ×3
DRAPE PROXIMA HALF (DRAPES) ×2 IMPLANT
DRAPE SURG ORHT 6 SPLT 77X108 (DRAPES) ×3 IMPLANT
DRAPE U-SHAPE 47X51 STRL (DRAPES) ×2 IMPLANT
DRAPE X-RAY CASS 24X20 (DRAPES) IMPLANT
DRSG PAD ABDOMINAL 8X10 ST (GAUZE/BANDAGES/DRESSINGS) ×2 IMPLANT
DURAPREP 26ML APPLICATOR (WOUND CARE) ×2 IMPLANT
ELECT REM PT RETURN 9FT ADLT (ELECTROSURGICAL) ×2
ELECTRODE REM PT RTRN 9FT ADLT (ELECTROSURGICAL) ×1 IMPLANT
EVACUATOR 1/8 PVC DRAIN (DRAIN) ×2 IMPLANT
FACESHIELD LNG OPTICON STERILE (SAFETY) ×2 IMPLANT
FLUID NSS /IRRIG 3000 ML XXX (IV SOLUTION) ×2 IMPLANT
GAUZE XEROFORM 5X9 LF (GAUZE/BANDAGES/DRESSINGS) ×2 IMPLANT
GLOVE BIO SURGEON ST LM GN SZ9 (GLOVE) ×2 IMPLANT
GLOVE BIOGEL PI IND STRL 8 (GLOVE) ×1 IMPLANT
GLOVE BIOGEL PI INDICATOR 8 (GLOVE) ×1
GLOVE SURG ORTHO 8.0 STRL STRW (GLOVE) ×2 IMPLANT
GOWN PREVENTION PLUS LG XLONG (DISPOSABLE) IMPLANT
GOWN PREVENTION PLUS XLARGE (GOWN DISPOSABLE) ×2 IMPLANT
GOWN STRL NON-REIN LRG LVL3 (GOWN DISPOSABLE) ×6 IMPLANT
HANDPIECE INTERPULSE COAX TIP (DISPOSABLE) ×1
HOOD PEEL AWAY FACE SHEILD DIS (HOOD) ×6 IMPLANT
IMMOBILIZER KNEE 20 (SOFTGOODS)
IMMOBILIZER KNEE 20 THIGH 36 (SOFTGOODS) IMPLANT
IMMOBILIZER KNEE 22 UNIV (SOFTGOODS) IMPLANT
IMMOBILIZER KNEE 24 THIGH 36 (MISCELLANEOUS) IMPLANT
IMMOBILIZER KNEE 24 UNIV (MISCELLANEOUS)
KIT BASIN OR (CUSTOM PROCEDURE TRAY) ×2 IMPLANT
KIT ROOM TURNOVER OR (KITS) ×2 IMPLANT
MANIFOLD NEPTUNE II (INSTRUMENTS) ×2 IMPLANT
MARKER SPHERE PSV REFLC THRD 5 (MARKER) ×6 IMPLANT
NEEDLE 18GX1X1/2 (RX/OR ONLY) (NEEDLE) ×2 IMPLANT
NEEDLE SPNL 18GX3.5 QUINCKE PK (NEEDLE) ×2 IMPLANT
NS IRRIG 1000ML POUR BTL (IV SOLUTION) ×2 IMPLANT
PACK TOTAL JOINT (CUSTOM PROCEDURE TRAY) ×2 IMPLANT
PAD ARMBOARD 7.5X6 YLW CONV (MISCELLANEOUS) ×4 IMPLANT
PAD CAST 4YDX4 CTTN HI CHSV (CAST SUPPLIES) ×1 IMPLANT
PADDING CAST COTTON 4X4 STRL (CAST SUPPLIES) ×1
PADDING CAST COTTON 6X4 STRL (CAST SUPPLIES) ×2 IMPLANT
PIN SCHANZ 4MM 130MM (PIN) ×8 IMPLANT
RUBBERBAND STERILE (MISCELLANEOUS) ×2 IMPLANT
SET HNDPC FAN SPRY TIP SCT (DISPOSABLE) ×1 IMPLANT
SPONGE GAUZE 4X4 12PLY (GAUZE/BANDAGES/DRESSINGS) ×2 IMPLANT
SPONGE LAP 18X18 X RAY DECT (DISPOSABLE) IMPLANT
STAPLER VISISTAT 35W (STAPLE) ×2 IMPLANT
SUCTION FRAZIER TIP 10 FR DISP (SUCTIONS) ×2 IMPLANT
SUT ETHILON 3 0 PS 1 (SUTURE) ×4 IMPLANT
SUT VIC AB 0 CTB1 27 (SUTURE) ×6 IMPLANT
SUT VIC AB 1 CT1 27 (SUTURE) ×5
SUT VIC AB 1 CT1 27XBRD ANBCTR (SUTURE) ×5 IMPLANT
SUT VIC AB 2-0 CT1 27 (SUTURE) ×2
SUT VIC AB 2-0 CT1 TAPERPNT 27 (SUTURE) ×2 IMPLANT
SYR 30ML SLIP (SYRINGE) ×2 IMPLANT
SYR TB 1ML LUER SLIP (SYRINGE) ×2 IMPLANT
TOWEL OR 17X24 6PK STRL BLUE (TOWEL DISPOSABLE) ×2 IMPLANT
TOWEL OR 17X26 10 PK STRL BLUE (TOWEL DISPOSABLE) ×8 IMPLANT
TRAY FOLEY CATH 14FR (SET/KITS/TRAYS/PACK) ×2 IMPLANT
WATER STERILE IRR 1000ML POUR (IV SOLUTION) ×6 IMPLANT
WRAP KNEE MAXI GEL POST OP (GAUZE/BANDAGES/DRESSINGS) ×2 IMPLANT

## 2012-02-14 NOTE — H&P (Signed)
TOTAL KNEE ADMISSION H&P  Patient is being admitted for left total knee arthroplasty.  Subjective:  Chief Complaint:left knee pain.  HPI: Kaitlin Cantrell, 56 y.o. female, has a history of pain and functional disability in the left knee due to arthritis and has failed non-surgical conservative treatments for greater than 12 weeks to include activity modification and medications.  Onset of symptoms was gradual, starting many years ago with gradual worsening course since that time. The patient noted no past surgery on the left knee recently  Patient currently rates pain in the left  knee(s) at 10 out of 10 with activity. Patient has worsening of pain with activity and weight bearing.  Patient has evidence of subchondral sclerosis and joint space narrowing by imaging studies. This patient has had debilitating symptoms. There is no active infection.  Patient Active Problem List   Diagnosis Date Noted  . HTN (hypertension) 05/10/2011   Past Medical History  Diagnosis Date  . Allergy     seasonal  . Arthritis   . Diabetes mellitus   . GERD (gastroesophageal reflux disease)   . Hyperlipidemia   . Hypertension   . History of pneumonia     15 years ago    Past Surgical History  Procedure Date  . Ankle fracture surgery 1974    left and pinned then pins removed  . Ankle ganglion cyst excision 1994    right  . Back surgery 1998    discectomy and then fusion  . Umbilical hernia repair 1996  . Tubal ligation 1996  . Tonsillectomy 1972    Prescriptions prior to admission  Medication Sig Dispense Refill  . benazepril (LOTENSIN) 40 MG tablet Take 40 mg by mouth every evening.      Marland Kitchen glimepiride (AMARYL) 2 MG tablet Take 2 mg by mouth 2 (two) times daily.       . hydrochlorothiazide (HYDRODIURIL) 25 MG tablet Take 25 mg by mouth daily.      Marland Kitchen HYDROcodone-acetaminophen (NORCO/VICODIN) 5-325 MG per tablet Take 1 tablet by mouth every 6 (six) hours as needed. For pain      . Krill Oil CAPS  Take 1 capsule by mouth 2 (two) times daily.       . metFORMIN (GLUCOPHAGE) 850 MG tablet Take 850 mg by mouth 2 (two) times daily with a meal.        . naproxen sodium (ANAPROX) 220 MG tablet Take 440 mg by mouth 2 (two) times daily with a meal.       . omeprazole (PRILOSEC) 20 MG capsule Take 20 mg by mouth daily.      . pravastatin (PRAVACHOL) 40 MG tablet Take 40 mg by mouth at bedtime.       . Probiotic Product (MISC INTESTINAL FLORA REGULAT) CHEW Chew 1 tablet by mouth daily.      . traMADol (ULTRAM) 50 MG tablet Take 100 mg by mouth 3 (three) times daily. Maximum dose= 8 tablets per day      . aspirin EC 81 MG tablet Take 81 mg by mouth daily.       Allergies  Allergen Reactions  . Morphine And Related Other (See Comments)    Very aggressive and doesn't help pain    History  Substance Use Topics  . Smoking status: Current Every Day Smoker -- 1.0 packs/day for 40 years    Types: Cigarettes  . Smokeless tobacco: Never Used  . Alcohol Use: No    Family History  Problem Relation  Age of Onset  . Colon polyps Father   . Diabetes Father     mother  . Heart disease Father   . Heart murmur Mother      Review of Systems  Constitutional: Negative.   HENT: Negative.   Eyes: Negative.   Respiratory: Negative.   Cardiovascular: Negative.   Gastrointestinal: Negative.   Genitourinary: Negative.   Musculoskeletal: Positive for joint pain.  Skin: Negative.   Neurological: Negative.   Endo/Heme/Allergies: Negative.   Psychiatric/Behavioral: Negative.     Objective:  Physical Exam  Constitutional: She appears well-developed.  HENT:  Head: Normocephalic.  Eyes: Pupils are equal, round, and reactive to light.  Neck: Normal range of motion.  Cardiovascular: Normal rate.   Respiratory: Effort normal.  GI: Soft.  Neurological: She is alert.  Skin: Skin is warm.  dp 2/4 df and pf ok  rom 5 to 105 - collaterals ok - no groin pain with IR and ER  Vital signs in last 24  hours: Temp:  [98.5 F (36.9 C)] 98.5 F (36.9 C) (10/15 0616) Pulse Rate:  [76] 76  (10/15 0616) Resp:  [20] 20  (10/15 0616) BP: (133)/(86) 133/86 mmHg (10/15 0616) SpO2:  [95 %] 95 % (10/15 0616)  Labs:   Estimated Body mass index is 35.77 kg/(m^2) as calculated from the following:   Height as of 05/10/11: 5\' 7" (1.702 m).   Weight as of 05/10/11: 228 lb 6.4 oz(103.602 kg).   Imaging Review Plain radiographs demonstrate severe degenerative joint disease of the left knee(s). The overall alignment ismild varus. The bone quality appears to be good for age and reported activity level.  Assessment/Plan:  End stage arthritis, left knee   The patient history, physical examination, clinical judgment of the provider and imaging studies are consistent with end stage degenerative joint disease of the left knee(s) and total knee arthroplasty is deemed medically necessary. The treatment options including medical management, injection therapy arthroscopy and arthroplasty were discussed at length. The risks and benefits of total knee arthroplasty were presented and reviewed. The risks due to aseptic loosening, infection, stiffness, patella tracking problems, thromboembolic complications and other imponderables were discussed. The patient acknowledged the explanation, agreed to proceed with the plan and consent was signed. Patient is being admitted for inpatient treatment for surgery, pain control, PT, OT, prophylactic antibiotics, VTE prophylaxis, progressive ambulation and ADL's and discharge planning. The patient is planning to be discharged home with home health services Patient has good home support for rehab.

## 2012-02-14 NOTE — Transfer of Care (Signed)
Immediate Anesthesia Transfer of Care Note  Patient: Kaitlin Cantrell  Procedure(s) Performed: Procedure(s) (LRB) with comments: COMPUTER ASSISTED TOTAL KNEE ARTHROPLASTY (Left) - Left total knee arthroplasty  Patient Location: PACU  Anesthesia Type: General  Level of Consciousness: awake, alert  and oriented  Airway & Oxygen Therapy: Patient Spontanous Breathing and Patient connected to face mask oxygen  Post-op Assessment: Report given to PACU RN and Post -op Vital signs reviewed and stable  Post vital signs: Reviewed  Complications: No apparent anesthesia complications

## 2012-02-14 NOTE — Progress Notes (Signed)
ANTICOAGULATION CONSULT NOTE - Initial Consult  Pharmacy Consult for coumadin Indication: VTE prophylaxis  Allergies  Allergen Reactions  . Morphine And Related Other (See Comments)    Very aggressive and doesn't help pain    Vital Signs: Temp: 98.9 F (37.2 C) (10/15 1320) Temp src: Oral (10/15 0616) BP: 102/50 mmHg (10/15 1236) Pulse Rate: 73  (10/15 1320)  Labs:  Basename 02/14/12 0621  HGB --  HCT --  PLT --  APTT 32  LABPROT --  INR --  HEPARINUNFRC --  CREATININE --  CKTOTAL --  CKMB --  TROPONINI --    The CrCl is unknown because both a height and weight (above a minimum accepted value) are required for this calculation.   Medical History: Past Medical History  Diagnosis Date  . Allergy     seasonal  . Arthritis   . Diabetes mellitus   . GERD (gastroesophageal reflux disease)   . Hyperlipidemia   . Hypertension   . History of pneumonia     15 years ago    Assessment: 54 YOF with left knee arthritis, s/p total knee arthroplasty to start coumadin for VTE prophylaxis. No anticoagulation prior to admission. Baseline INR 1.0, coumadin point = 6  Goal of Therapy:  INR 2-3 Monitor platelets by anticoagulation protocol: Yes   Plan:  - Coumadin 7.5 mg po x 1 - F/u daily INR - Coumadin education book and video   Bayard Hugger, PharmD, BCPS  Clinical Pharmacist  Pager: [phone]  02/14/2012,2:22 PM

## 2012-02-14 NOTE — Progress Notes (Signed)
Orthopedic Tech Progress Note Patient Details:  Kaitlin Cantrell Sep 05, 1955 [id number]  CPM Left Knee CPM Left Knee: On Left Knee Flexion (Degrees): 60  Left Knee Extension (Degrees): 0  Additional Comments: overhead frame    Jennye Moccasin 02/14/2012, 2:32 PM

## 2012-02-14 NOTE — Preoperative (Signed)
Beta Blockers   Reason not to administer Beta Blockers:Not Applicable 

## 2012-02-14 NOTE — Anesthesia Preprocedure Evaluation (Addendum)
Anesthesia Evaluation  Patient identified by MRN, date of birth, ID band Patient awake    Reviewed: Allergy & Precautions, H&P , NPO status , Patient's Chart, lab work & pertinent test results  History of Anesthesia Complications Negative for: history of anesthetic complications  Airway Mallampati: II TM Distance: >3 FB Neck ROM: Full    Dental  (+) Poor Dentition, Missing, Dental Advisory Given and Chipped   Pulmonary COPDCurrent Smoker,  breath sounds clear to auscultation  Pulmonary exam normal       Cardiovascular hypertension, Pt. on medications Rhythm:Regular Rate:Normal     Neuro/Psych negative neurological ROS  negative psych ROS   GI/Hepatic GERD-  Medicated and Controlled,  Endo/Other  Well Controlled, Type 2, Oral Hypoglycemic AgentsDiabetes: glu 140.Morbid obesity  Renal/GU      Musculoskeletal   Abdominal (+) + obese,   Peds  Hematology   Anesthesia Other Findings   Reproductive/Obstetrics                          Anesthesia Physical Anesthesia Plan  ASA: III  Anesthesia Plan: General   Post-op Pain Management:    Induction: Intravenous  Airway Management Planned: Oral ETT  Additional Equipment:   Intra-op Plan:   Post-operative Plan: Extubation in OR  Informed Consent: I have reviewed the patients History and Physical, chart, labs and discussed the procedure including the risks, benefits and alternatives for the proposed anesthesia with the patient or authorized representative who has indicated his/her understanding and acceptance.   Dental advisory given  Plan Discussed with: CRNA and Surgeon  Anesthesia Plan Comments: (Plan routine monitors, GETA Dr. August Saucer prefers no femoral nerve block for analgesia)        Anesthesia Quick Evaluation

## 2012-02-14 NOTE — Brief Op Note (Signed)
02/14/2012  11:10 AM  PATIENT:  Kaitlin Cantrell  56 y.o. female  PRE-OPERATIVE DIAGNOSIS:  Left knee osteoarthritis  POST-OPERATIVE DIAGNOSIS:  Left knee osteoarthritis  PROCEDURE:  Procedure(s): COMPUTER ASSISTED TOTAL KNEE ARTHROPLASTY  SURGEON:  Surgeon(s): Cammy Copa, MD  ASSISTANT: S vernon  ANESTHESIA:   general  EBL: 100 ml    Total I/O In: 1800 [I.V.:1800] Out: 350 [Urine:250; Blood:100]  BLOOD ADMINISTERED: none  DRAINS: (left) Hemovact drain(s) in the knee with  Suction Clamped   LOCAL MEDICATIONS USED:  none  SPECIMEN:  No Specimen  COUNTS:  YES  TOURNIQUET:   Total Tourniquet Time Documented: Thigh (Left) - 121 minutes  DICTATION: .Other Dictation: Dictation Number [id number]  PLAN OF CARE: Admit to inpatient   PATIENT DISPOSITION:  PACU - hemodynamically stable

## 2012-02-14 NOTE — Anesthesia Postprocedure Evaluation (Signed)
  Anesthesia Post-op Note  Patient: Kaitlin Cantrell  Procedure(s) Performed: Procedure(s) (LRB) with comments: COMPUTER ASSISTED TOTAL KNEE ARTHROPLASTY (Left) - Left total knee arthroplasty  Patient Location: PACU  Anesthesia Type: General  Level of Consciousness: awake, alert , oriented and patient cooperative  Airway and Oxygen Therapy: Patient Spontanous Breathing and Patient connected to nasal cannula oxygen  Post-op Pain: mild  Post-op Assessment: Post-op Vital signs reviewed, Patient's Cardiovascular Status Stable, Respiratory Function Stable, Patent Airway, No signs of Nausea or vomiting and Pain level controlled  Post-op Vital Signs: Reviewed and stable  Complications: No apparent anesthesia complications

## 2012-02-14 NOTE — Anesthesia Procedure Notes (Signed)
Procedure Name: Intubation Date/Time: 02/14/2012 7:51 AM Performed by: Lovie Chol Pre-anesthesia Checklist: Patient identified, Emergency Drugs available, Suction available, Patient being monitored and Timeout performed Patient Re-evaluated:Patient Re-evaluated prior to inductionOxygen Delivery Method: Circle system utilized Preoxygenation: Pre-oxygenation with 100% oxygen Intubation Type: IV induction Ventilation: Mask ventilation without difficulty Laryngoscope Size: Miller and 2 Grade View: Grade I Tube type: Oral Tube size: 7.5 mm Number of attempts: 1 Airway Equipment and Method: Stylet and LTA kit utilized Placement Confirmation: positive ETCO2,  ETT inserted through vocal cords under direct vision and breath sounds checked- equal and bilateral Secured at: 22 cm Tube secured with: Tape Dental Injury: Teeth and Oropharynx as per pre-operative assessment

## 2012-02-15 ENCOUNTER — Encounter (HOSPITAL_COMMUNITY): Payer: Self-pay | Admitting: Orthopedic Surgery

## 2012-02-15 LAB — BASIC METABOLIC PANEL
BUN: 8 mg/dL (ref 6–23)
Chloride: 104 mEq/L (ref 96–112)
GFR calc Af Amer: >90 mL/min (ref >90)
Glucose, Bld: 93 mg/dL (ref 70–99)
Potassium: 4.5 mEq/L (ref 3.5–5.1)

## 2012-02-15 LAB — CBC
HCT: 29.6 % — ABNORMAL LOW (ref 36.0–46.0)
Hemoglobin: 9.1 g/dL — ABNORMAL LOW (ref 12.0–15.0)
RDW: 14.9 % (ref 11.5–15.5)
WBC: 7.3 10*3/uL (ref 4.0–10.5)

## 2012-02-15 LAB — GLUCOSE, CAPILLARY
Glucose-Capillary: 121 mg/dL — ABNORMAL HIGH (ref 70–99)
Glucose-Capillary: 131 mg/dL — ABNORMAL HIGH (ref 70–99)

## 2012-02-15 LAB — PROTIME-INR: INR: 1.13 (ref 0.00–1.49)

## 2012-02-15 MED ORDER — WARFARIN SODIUM 7.5 MG PO TABS
7.5000 mg | ORAL_TABLET | Freq: Once | ORAL | Status: AC
  Administered 2012-02-15: 7.5 mg via ORAL
  Filled 2012-02-15: qty 1

## 2012-02-15 MED ORDER — HYDROMORPHONE HCL PF 1 MG/ML IJ SOLN
1.0000 mg | INTRAMUSCULAR | Status: DC | PRN
  Administered 2012-02-15 – 2012-02-16 (×3): 1 mg via INTRAVENOUS
  Filled 2012-02-15 (×3): qty 1

## 2012-02-15 MED ORDER — METHOCARBAMOL 500 MG PO TABS
500.0000 mg | ORAL_TABLET | Freq: Four times a day (QID) | ORAL | Status: DC | PRN
  Administered 2012-02-15 – 2012-02-18 (×8): 500 mg via ORAL
  Filled 2012-02-15 (×8): qty 1

## 2012-02-15 MED ORDER — OXYCODONE HCL ER 10 MG PO T12A
10.0000 mg | EXTENDED_RELEASE_TABLET | Freq: Two times a day (BID) | ORAL | Status: DC
  Administered 2012-02-15 – 2012-02-18 (×7): 10 mg via ORAL
  Filled 2012-02-15 (×7): qty 1

## 2012-02-15 NOTE — Op Note (Signed)
NAMEEARLENA, WERST            ACCOUNT NO.:  [id number]  MEDICAL RECORD NO.:  [id number]  LOCATION:  [id number]                        FACILITY:  [facility]  PHYSICIAN:  Burnard Bunting, M.D.    DATE OF BIRTH:  October 07, 1955  DATE OF PROCEDURE:  02/14/2012 DATE OF DISCHARGE:                              OPERATIVE REPORT   PREOPERATIVE DIAGNOSIS:  Left knee arthritis.  POSTOPERATIVE DIAGNOSIS:  Left knee arthritis.  PROCEDURE:  Left total knee replacement, DePuy PCL sacrificing 4 narrow femur, 4 tibia, 17 poly, 35 patella.  SURGEON:  Burnard Bunting, MD  ASSISTANT:  Wende Neighbors, PA  ANESTHESIA:  General endotracheal.  ESTIMATED BLOOD LOSS:  100 mL.  DRAINS:  Hemovac x1.  TOURNIQUET TIME:  Two hours at 300 mmHg.  INDICATION:  Casandra Dallaire is a patient with end-stage left knee arthritis, joint space narrowing, sclerosis, osteophyte formation, who has pain with ADLs, intractable ulnar fractures, nonoperative management including activity modification, nonsteroidals, injections.  The patient presents now for operative management after explaining the risks and benefits.  PROCEDURE IN DETAIL:  The patient was brought to the operating room, where general endotracheal anesthesia was induced.  Preoperative antibiotics administered.  Time-out was called.  Vancomycin was given preoperatively.  The left knee was prescrubbed with alcohol, Betadine, which allowed to air dry.  Prepped with DuraPrep solution, draped in sterile manner.  Collier Flowers was used to cover the operative field.  Leg was elevated, exsanguinated with Esmarch wrap.  Tourniquet was inflated. Anterior approach to the knee was made.  Skin and subcutaneous tissues were sharply divided.  Median parapatellar approach was made, marked with #1 Vicryl suture.  Patella was everted.  Fat pad partially excised. Lateral patellofemoral ligament released, soft tissue elevated off the distal anterior femur.  At this time, 2 pins were  placed proximal medial tibia and distal medial femur.  Registration points were achieved.  Hip center rotation, bimalleolar axis, various points around the knee. Proximal tibial cut was made perpendicular to mechanical axis of the tibia with collaterals and posterior structures well protected.  At this time initial cut was 6-7 mm, but will require revision to about 10 mm due to tightness in both extension and flexion.  Distal femoral cut was then made initially 11 mm.  Spacer block was placed after revising the tibial cut.  The patient did have full extension with 10 and 12 spacer block.  At this time, box cut and chamfer cuts were made.  Collaterals protected.  The tibia was keel punched.  The patient had a good stability to varus and valgus stress with a 15 poly.  Patella was then prepared from 25 to 13. With the trial components in position, the patient had full extension, full flexion, excellent patellar tracking with no thumbs technique.  The patient's trial components were removed. Thorough irrigation was performed with 3 L of pulsatile irrigation. True components were then cemented into position.  Excess cement removed, 17 poly spacer was used which had left about 3 degrees of flexion but gave better medial and lateral stability.  At this time the tourniquet was released.  Bleeding points encountered and controlled using electrocautery.  The incision was closed using #1  Vicryl suture, 0 Vicryl suture, 2-0 Vicryl suture, and staples.  The incisions for the pins were irrigated and closed using a 3-0 nylon.  Solution of Marcaine and morphine finally injected to the knee.  The patient tolerated the procedure well without immediate complications, transferred to the recovery room in stable condition.  Velna Hatchet Vernon's assistance was required all times during the case for retraction, opening, closing, limb positioning, cement removal.  Her assistance was a medical necessity.     Burnard Bunting, M.D.     GSD/MEDQ  D:  02/14/2012  T:  02/15/2012  Job:  [id number]

## 2012-02-15 NOTE — Progress Notes (Signed)
Subjective: Pt stable - pain controlled on iv meds   Objective: Vital signs in last 24 hours: Temp:  [97.5 F (36.4 C)-99 F (37.2 C)] 97.8 F (36.6 C) (10/16 0714) Pulse Rate:  [71-92] 92  (10/16 0714) Resp:  [15-42] 16  (10/16 0743) BP: (99-146)/(39-63) 104/52 mmHg (10/16 0714) SpO2:  [94 %-100 %] 96 % (10/16 0743) FiO2 (%):  [97 %] 97 % (10/15 1600) Weight:  [102.059 kg (225 lb)] 102.059 kg (225 lb) (10/16 0500)  Intake/Output from previous day: 10/15 0701 - 10/16 0700 In: 3950 [P.O.:970; I.V.:2880] Out: 2375 [Urine:2100; Drains:175; Blood:100] Intake/Output this shift: Total I/O In: -  Out: 250 [Urine:250]  Exam:  Neurovascular intact Sensation intact distally Intact pulses distally Dorsiflexion/Plantar flexion intact  Labs: No results found for this basename: HGB:5 in the last 72 hours No results found for this basename: WBC:2,RBC:2,HCT:2,PLT:2 in the last 72 hours No results found for this basename: NA:2,K:2,CL:2,CO2:2,BUN:2,CREATININE:2,GLUCOSE:2,CALCIUM:2 in the last 72 hours No results found for this basename: LABPT:2,INR:2 in the last 72 hours  Assessment/Plan: CPM today - may need to continue PCA - PT - labs pending   Nesha Counihan SCOTT 02/15/2012, 7:54 AM

## 2012-02-15 NOTE — Progress Notes (Signed)
Referral received for SNF. Chart reviewed and CSW has spoken with RNCM who indicates that patient is for DC to home with Home Health and DME.  CSW to sign off. Please re-consult if CSW needs arise.  Ilea T. Wilma Wuthrich, LCSWA  [phone]  

## 2012-02-15 NOTE — Evaluation (Signed)
Occupational Therapy Evaluation Patient Details Name: Kaitlin Cantrell MRN: [id number] DOB: Dec 12, 1955 Today's Date: 02/15/2012 Time: 1500-1520 OT Time Calculation (min): 20 min  OT Assessment / Plan / Recommendation Clinical Impression  Pt s/p L TKA thus affecting PLOF.  Pt limited by pain during eval session. Will benefit from acute OT services to address below problem list in prep for d/c home.    OT Assessment  Patient needs continued OT Services    Follow Up Recommendations  Supervision/Assistance - 24 hour;No OT follow up    Barriers to Discharge      Equipment Recommendations   (TBD by OT)    Recommendations for Other Services    Frequency  Min 2X/week    Precautions / Restrictions Precautions Precautions: Knee Precaution Comments: reviewed no pillow under knee Required Braces or Orthoses: Knee Immobilizer - Left Knee Immobilizer - Left: Other (comment) (until discontinued) Restrictions Weight Bearing Restrictions: Yes LLE Weight Bearing: Weight bearing as tolerated   Pertinent Vitals/Pain See vitals    ADL  Eating/Feeding: Performed;Independent Where Assessed - Eating/Feeding: Edge of bed Toilet Transfer: Simulated;Minimal assistance Toilet Transfer Method: Stand pivot Acupuncturist:  (EOB to chair) Equipment Used: Knee Immobilizer;Rolling walker Transfers/Ambulation Related to ADLs: Min assist with RW.  Verbal cueing for sequencing ADL Comments: Pt extremely limited by pain.    OT Diagnosis: Acute pain  OT Problem List: Decreased activity tolerance;Decreased knowledge of use of DME or AE;Pain OT Treatment Interventions: Self-care/ADL training;DME and/or AE instruction;Therapeutic activities;Patient/family education   OT Goals Acute Rehab OT Goals OT Goal Formulation: With patient Time For Goal Achievement: 02/22/12 Potential to Achieve Goals: Good ADL Goals Pt Will Perform Grooming: with supervision;Standing at sink ADL Goal: Grooming -  Progress: Goal set today Pt Will Transfer to Toilet: with supervision;Ambulation;with DME;Comfort height toilet ADL Goal: Toilet Transfer - Progress: Goal set today Pt Will Perform Toileting - Clothing Manipulation: with supervision;Sitting on 3-in-1 or toilet;Standing ADL Goal: Toileting - Clothing Manipulation - Progress: Goal set today Pt Will Perform Toileting - Hygiene: with supervision;Sit to stand from 3-in-1/toilet;Standing at 3-in-1/toilet ADL Goal: Toileting - Hygiene - Progress: Goal set today Pt Will Perform Tub/Shower Transfer: Shower transfer;with supervision;Ambulation;with DME ADL Goal: Tub/Shower Transfer - Progress: Goal set today  Visit Information  Last OT Received On: 02/15/12 Assistance Needed: +1    Subjective Data      Prior Functioning     Home Living Lives With: Spouse Available Help at Discharge: Family;Available 24 hours/day Type of Home: House Home Access: Stairs to enter [employer] of Steps: 4 Entrance Stairs-Rails: Right Home Layout: One level Bathroom Shower/Tub: Health visitor: Handicapped height Bathroom Accessibility: Yes Home Adaptive Equipment: Environmental consultant - four wheeled Additional Comments: pt takes care of her mother and mother-in-law but does not have to physically assist them Prior Function Level of Independence: Independent Able to Take Stairs?: Yes Driving: Yes Vocation: Unemployed Comments: pt's husband and daughter will be helping her when she goes home Communication Communication: No difficulties Dominant Hand: Right         Vision/Perception     Cognition  Overall Cognitive Status: Appears within functional limits for tasks assessed/performed Arousal/Alertness: Awake/alert Orientation Level: Oriented X4 / Intact Behavior During Session: [facility] for tasks performed    Extremity/Trunk Assessment Right Upper Extremity Assessment RUE ROM/Strength/Tone: [facility] for tasks assessed Left Upper Extremity  Assessment LUE ROM/Strength/Tone: [facility] for tasks assessed     Mobility Bed Mobility Bed Mobility: Supine to Sit;Sitting - Scoot to [place] of  Bed Supine to Sit: 4: Min assist;HOB flat;With rails Sitting - Scoot to Edge of Bed: 4: Min assist Details for Bed Mobility Assistance: Assist to support LLE Transfers Transfers: Sit to Stand;Stand to Sit Sit to Stand: 4: Min assist;From bed;With upper extremity assist Stand to Sit: 4: Min guard;To chair/3-in-1;With armrests;With upper extremity assist Details for Transfer Assistance: VC for safe hand placement.      Shoulder Instructions     Exercise     Balance     End of Session OT - End of Session Equipment Utilized During Treatment: Gait belt;Left knee immobilizer Activity Tolerance: Patient limited by pain Patient left: in chair;with call bell/phone within reach;with family/visitor present;with nursing in room Nurse Communication: Mobility status;Patient requests pain meds  GO   02/15/2012 Cipriano Mile OTR/L Pager [phone] Office [phone]  Cipriano Mile 02/15/2012, 4:01 PM

## 2012-02-15 NOTE — Progress Notes (Signed)
Physical Therapy Treatment Patient Details Name: Kaitlin Cantrell MRN: [id number] DOB: 1956/04/05 Today's Date: 02/15/2012 Time: 7829-5621 PT Time Calculation (min): 26 min  PT Assessment / Plan / Recommendation Comments on Treatment Session  Pt progressing well with therapy, ambulated 120' with RW and min-guard A, left in CPM 0-45 (could not tolerate 60 deg flexion). PT will continue to follow.     Follow Up Recommendations  Home health PT;Supervision - Intermittent     Does the patient have the potential to tolerate intense rehabilitation     Barriers to Discharge        Equipment Recommendations  Rolling walker with 5" wheels    Recommendations for Other Services    Frequency 7X/week   Plan Discharge plan remains appropriate;Frequency remains appropriate    Precautions / Restrictions Precautions Precautions: Knee Precaution Comments: reviewed no pillow under knee Required Braces or Orthoses: Knee Immobilizer - Left Knee Immobilizer - Left: Other (comment) (until discontinued) Restrictions Weight Bearing Restrictions: Yes LLE Weight Bearing: Weight bearing as tolerated   Pertinent Vitals/Pain 4/10 left knee in CPM, premedicated    Mobility  Bed Mobility Bed Mobility: Sit to Supine Supine to Sit: 4: Min assist;HOB flat;With rails Sitting - Scoot to Edge of Bed: 4: Min assist Sit to Supine: 4: Min assist Details for Bed Mobility Assistance: Assist to support LLE Transfers Transfers: Sit to Stand;Stand to Sit Sit to Stand: 4: Min guard;From chair/3-in-1;With upper extremity assist Stand to Sit: 4: Min guard;To bed;With upper extremity assist Details for Transfer Assistance: pt able to perform with increased time but no physical assist Ambulation/Gait Ambulation/Gait Assistance: 4: Min assist Ambulation Distance (Feet): 120 Feet Assistive device: Rolling walker Ambulation/Gait Assistance Details: vc's for sequencing, upright posture and not trying to rush through  it Gait Pattern: Step-to pattern;Decreased stance time - left Gait velocity: decreased Stairs: No Wheelchair Mobility Wheelchair Mobility: No    Exercises Total Joint Exercises Ankle Circles/Pumps: AROM;Both;20 reps;Seated Quad Sets: AROM;Both;10 reps;Seated Heel Slides: AAROM;Left;10 reps;Seated Straight Leg Raises: AAROM;Left;5 reps;Seated Long Arc Quad: AAROM;Left;10 reps;Seated Bridges: AROM;Both;5 reps;Supine   PT Diagnosis:    PT Problem List:   PT Treatment Interventions:     PT Goals Acute Rehab PT Goals PT Goal Formulation: With patient/family Time For Goal Achievement: 02/22/12 Potential to Achieve Goals: Good Pt will go Supine/Side to Sit: with modified independence PT Goal: Supine/Side to Sit - Progress: Progressing toward goal Pt will go Sit to Supine/Side: with modified independence PT Goal: Sit to Supine/Side - Progress: Progressing toward goal Pt will go Sit to Stand: with modified independence PT Goal: Sit to Stand - Progress: Progressing toward goal Pt will go Stand to Sit: with modified independence PT Goal: Stand to Sit - Progress: Progressing toward goal Pt will Ambulate: >150 feet;with modified independence;with rolling walker PT Goal: Ambulate - Progress: Progressing toward goal Pt will Go Up / Down Stairs: 3-5 stairs;with rail(s);with supervision Pt will Perform Home Exercise Program: Independently PT Goal: Perform Home Exercise Program - Progress: Progressing toward goal  Visit Information  Last PT Received On: 02/15/12 Assistance Needed: +1    Subjective Data  Subjective: pt feeling better after pain meds Patient Stated Goal: return home   Cognition  Overall Cognitive Status: Appears within functional limits for tasks assessed/performed Arousal/Alertness: Awake/alert Orientation Level: Oriented X4 / Intact Behavior During Session: [facility] for tasks performed    Balance  Balance Balance Assessed: Yes Dynamic Standing Balance Dynamic  Standing - Balance Support: Right upper extremity supported;During functional activity  Dynamic Standing - Level of Assistance: 4: Min assist  End of Session PT - End of Session Equipment Utilized During Treatment: Gait belt;Left knee immobilizer Activity Tolerance: Patient tolerated treatment well Patient left: in bed;in CPM;with call bell/phone within reach;with family/visitor present Nurse Communication: Mobility status CPM Left Knee CPM Left Knee: On Left Knee Flexion (Degrees): 45    GP   Lyanne Co, PT  Acute Rehab Services  [phone]   Lyanne Co 02/15/2012, 4:52 PM

## 2012-02-15 NOTE — Progress Notes (Signed)
Physical Therapy Evaluation Patient Details Name: Kaitlin Cantrell MRN: [id number] DOB: 05-16-1955 Today's Date: 02/15/2012 Time: 7829-5621 PT Time Calculation (min): 30 min  PT Assessment / Plan / Recommendation Clinical Impression  Pt is 56 yo female s/p left TKA who mobilized well first time OOB, 78' with RW and min A.  Recommend PT to increase ROM and strength of the left knee to increase mobility and independence to d/c home with family and HHPT.    PT Assessment  Patient needs continued PT services    Follow Up Recommendations  Home health PT;Supervision - Intermittent    Does the patient have the potential to tolerate intense rehabilitation      Barriers to Discharge None      Equipment Recommendations  Rolling walker with 5" wheels    Recommendations for Other Services     Frequency 7X/week    Precautions / Restrictions Precautions Precautions: Knee Precaution Comments: reviewed no pillow under knee Required Braces or Orthoses: Knee Immobilizer - Left Knee Immobilizer - Left: Other (comment) (until discontinued) Restrictions Weight Bearing Restrictions: Yes LLE Weight Bearing: Weight bearing as tolerated   Pertinent Vitals/Pain 7/10 pain left knee after session, PCA used      Mobility  Bed Mobility Bed Mobility: Supine to Sit;Sitting - Scoot to Edge of Bed Supine to Sit: 4: Min assist;With rails Sitting - Scoot to [place] of Bed: 4: Min assist Details for Bed Mobility Assistance: min A to support left leg  Transfers Transfers: Sit to Stand;Stand to Sit Sit to Stand: 4: Min assist;From bed;From chair/3-in-1;With upper extremity assist Stand to Sit: 4: Min assist;To chair/3-in-1;With upper extremity assist Details for Transfer Assistance: vc's for hand placement with RW Ambulation/Gait Ambulation/Gait Assistance: 4: Min assist Ambulation Distance (Feet): 20 Feet Assistive device: Rolling walker Ambulation/Gait Assistance Details: vc's for sequencing Gait  Pattern: Step-to pattern;Decreased stance time - left Gait velocity: decreased Stairs: No Wheelchair Mobility Wheelchair Mobility: No    Shoulder Instructions     Exercises Total Joint Exercises Ankle Circles/Pumps: AROM;Both;20 reps;Seated Quad Sets: AROM;Both;10 reps;Seated   PT Diagnosis: Difficulty walking;Abnormality of gait;Acute pain  PT Problem List: Decreased strength;Decreased range of motion;Decreased activity tolerance;Decreased balance;Decreased mobility;Decreased knowledge of use of DME;Decreased knowledge of precautions;Pain PT Treatment Interventions: DME instruction;Gait training;Stair training;Functional mobility training;Therapeutic activities;Therapeutic exercise;Balance training;Neuromuscular re-education;Patient/family education   PT Goals Acute Rehab PT Goals PT Goal Formulation: With patient/family Time For Goal Achievement: 02/22/12 Potential to Achieve Goals: Good Pt will go Supine/Side to Sit: with modified independence PT Goal: Supine/Side to Sit - Progress: Goal set today Pt will go Sit to Supine/Side: with modified independence PT Goal: Sit to Supine/Side - Progress: Goal set today Pt will go Sit to Stand: with modified independence PT Goal: Sit to Stand - Progress: Goal set today Pt will go Stand to Sit: with modified independence PT Goal: Stand to Sit - Progress: Goal set today Pt will Ambulate: >150 feet;with modified independence;with rolling walker PT Goal: Ambulate - Progress: Goal set today Pt will Go Up / Down Stairs: 3-5 stairs;with rail(s);with supervision PT Goal: Up/Down Stairs - Progress: Goal set today Pt will Perform Home Exercise Program: Independently PT Goal: Perform Home Exercise Program - Progress: Goal set today  Visit Information  Last PT Received On: 02/15/12 Assistance Needed: +1    Subjective Data  Subjective: pt frustrated by lack of reposnse to call bell last night Patient Stated Goal: return home   Prior  Functioning  Home Living Lives With: Spouse Available Help at  Discharge: Family;Available 24 hours/day Type of Home: House Home Access: Stairs to enter [employer] of Steps: 4 Entrance Stairs-Rails: Right Home Layout: One level Bathroom Shower/Tub: Health visitor: Handicapped height Bathroom Accessibility: Yes Home Adaptive Equipment: Environmental consultant - four wheeled Additional Comments: pt takes care of her mother and mother-in-law but does not have to physically assist them Prior Function Level of Independence: Independent Able to Take Stairs?: Yes Driving: Yes Vocation: Unemployed Comments: pt's husband and daughter will be helping her when she goes home Communication Communication: No difficulties Dominant Hand: Right    Cognition  Overall Cognitive Status: Appears within functional limits for tasks assessed/performed Arousal/Alertness: Awake/alert Orientation Level: Oriented X4 / Intact Behavior During Session: [facility] for tasks performed    Extremity/Trunk Assessment Right Upper Extremity Assessment RUE ROM/Strength/Tone: WFL for tasks assessed RUE Sensation: WFL - Light Touch RUE Coordination: WFL - gross motor Left Upper Extremity Assessment LUE ROM/Strength/Tone: WFL for tasks assessed LUE Sensation: WFL - Light Touch LUE Coordination: WFL - gross motor Right Lower Extremity Assessment RLE ROM/Strength/Tone: Within functional levels RLE Sensation: WFL - Light Touch;WFL - Proprioception RLE Coordination: WFL - gross motor Left Lower Extremity Assessment LLE ROM/Strength/Tone: Deficits LLE ROM/Strength/Tone Deficits: quad 1/5, unable to lift left leg against gravity, ankle WFL LLE Sensation: WFL - Light Touch LLE Coordination: WFL - gross motor Trunk Assessment Trunk Assessment: Normal   Balance Balance Balance Assessed: No  End of Session PT - End of Session Equipment Utilized During Treatment: Gait belt;Left knee immobilizer Activity  Tolerance: Patient tolerated treatment well Patient left: in chair;with call bell/phone within reach Nurse Communication: Mobility status CPM Left Knee CPM Left Knee: Off  GP   Lyanne Co, PT  Acute Rehab Services  [phone]   Lyanne Co 02/15/2012, 10:33 AM

## 2012-02-15 NOTE — Progress Notes (Signed)
CARE MANAGEMENT NOTE 02/15/2012  Patient:  Kaitlin Cantrell, Kaitlin Cantrell   Account Number:  [id number]  Date Initiated:  02/15/2012  Documentation initiated by:  Vance Peper  Subjective/Objective Assessment:   56 yr old female s/p left total knee arthroplasty.     Action/Plan:   progression of care and discharge planning.CM spoke with patient regarding home health and DME needs at discharge.patient preoperatively setup with [facility], no changes. Has rolling walker, CPM .   Anticipated DC Date:  02/16/2012   Anticipated DC Plan:  HOME W HOME HEALTH SERVICES      DC Planning Services  CM consult      [facility] Choice  HOME HEALTH   Choice offered to / List presented to:  C-1 Patient        HH arranged  HH-1 RN  HH-2 PT      [facility] agency  [facility]   Status of service:  Completed, signed off Medicare Important Message given?   (If response is "NO", the following Medicare IM given date fields will be blank) Date Medicare IM given:   Date Additional Medicare IM given:    Discharge Disposition:  HOME W HOME HEALTH SERVICES  Per UR Regulation:    If discussed at Long Length of Stay Meetings, dates discussed:    Comments:  02/15/12  Vance Peper, RN BSN Case Manager Patient will be going to her mother's home at discharge:[address] [place], [place] [address] [phone].

## 2012-02-15 NOTE — Progress Notes (Signed)
ANTICOAGULATION CONSULT NOTE - Initial Consult  Pharmacy Consult for coumadin Indication: VTE prophylaxis  Assessment: 47 YOF with left knee arthritis, s/p total knee arthroplasty on coumadin for VTE prophylaxis. No anticoagulation prior to admission. INR 1.13 after 7.5mg  x 1, no bleeding noted per chart.  Goal of Therapy:  INR 2-3 Monitor platelets by anticoagulation protocol: Yes   Plan:  - Coumadin 7.5 mg po x 1 - F/u daily INR  Bayard Hugger, PharmD, BCPS  Clinical Pharmacist  Pager: [phone]   Allergies  Allergen Reactions  . Morphine And Related Other (See Comments)    Very aggressive and doesn't help pain    Vital Signs: Temp: 97.8 F (36.6 C) (10/16 0714) Temp src: Oral (10/15 2130) BP: 104/52 mmHg (10/16 0714) Pulse Rate: 92  (10/16 0714)  Labs:  Basename 02/15/12 0631 02/14/12 0621  HGB 9.1* --  HCT 29.6* --  PLT 227 --  APTT -- 32  LABPROT 14.3 --  INR 1.13 --  HEPARINUNFRC -- --  CREATININE -- --  CKTOTAL -- --  CKMB -- --  TROPONINI -- --    Estimated Creatinine Clearance: 73.9 ml/min (by C-G formula based on Cr of 1.05).   Medical History: Past Medical History  Diagnosis Date  . Allergy     seasonal  . Arthritis   . Diabetes mellitus   . GERD (gastroesophageal reflux disease)   . Hyperlipidemia   . Hypertension   . History of pneumonia     15 years ago

## 2012-02-15 NOTE — Progress Notes (Signed)
Utilization review completed. Baleigh Rennaker, RN, BSN. 

## 2012-02-16 LAB — CBC
Hemoglobin: 9.5 g/dL — ABNORMAL LOW (ref 12.0–15.0)
RBC: 3.83 MIL/uL — ABNORMAL LOW (ref 3.87–5.11)
WBC: 9.8 10*3/uL (ref 4.0–10.5)

## 2012-02-16 LAB — GLUCOSE, CAPILLARY
Glucose-Capillary: 95 mg/dL (ref 70–99)
Glucose-Capillary: 143 mg/dL — ABNORMAL HIGH (ref 70–99)

## 2012-02-16 LAB — PROTIME-INR
INR: 1.37 (ref 0.00–1.49)
Prothrombin Time: 16.5 seconds — ABNORMAL HIGH (ref 11.6–15.2)

## 2012-02-16 MED ORDER — WARFARIN SODIUM 7.5 MG PO TABS
7.5000 mg | ORAL_TABLET | Freq: Once | ORAL | Status: AC
  Administered 2012-02-16: 7.5 mg via ORAL
  Filled 2012-02-16: qty 1

## 2012-02-16 MED ORDER — DSS 100 MG PO CAPS: 100.0000 mg | ORAL_CAPSULE | Freq: Two times a day (BID) | ORAL | Status: DC

## 2012-02-16 MED ORDER — METHOCARBAMOL 500 MG PO TABS: 500.0000 mg | ORAL_TABLET | Freq: Four times a day (QID) | ORAL | Status: DC | PRN

## 2012-02-16 MED ORDER — OXYCODONE HCL ER 10 MG PO T12A: 10.0000 mg | EXTENDED_RELEASE_TABLET | Freq: Two times a day (BID) | ORAL | Status: DC

## 2012-02-16 MED ORDER — OXYCODONE HCL 5 MG PO TABS: 5.0000 mg | ORAL_TABLET | ORAL | Status: DC | PRN

## 2012-02-16 MED ORDER — WARFARIN SODIUM 5 MG PO TABS: 5.0000 mg | ORAL_TABLET | Freq: Every day | ORAL | Status: DC

## 2012-02-16 NOTE — Progress Notes (Signed)
Agree with PT cancellation note.  Charish Schroepfer, PT DPT [phone]  

## 2012-02-16 NOTE — Progress Notes (Signed)
ANTICOAGULATION CONSULT NOTE -Follow Up Consult  Pharmacy Consult for coumadin Indication: VTE prophylaxis  Admit Complaint: 56 y.o.  female  admitted 02/14/2012 s/p L TKA.  Pharmacy consulted to dose warfarin for VTE prophylasis.  Overnight Events: 02/16/2012 post op day 2, BP up to 170s, pain=5.   Assessment: Anticoagulation: VTE Prophylaxis: warfarin H/H stable, INR rising on 7.5 mg, now s/p 2 doses  Cardiovascular: HTN, hyperlipidemia : simvastatin, benazapril, HCTZ, HR 90-100s, SBP 100-170s  Current Weight: 225 lb (102.059 kg)  Endocrinology: DM, gilmeperide, SSI, metformin  Neurology/MSK: Post op pain: oxycontin, oxy ir, dilaudid, celebrex  PTA Medication Issues: Home Meds Not Ordered: Asprin, probiotic, Krill oil  Best Practices: DVT Prophylaxis:  warfarin  Goal of Therapy:  INR 2-3 Monitor platelets by anticoagulation protocol: Yes   Plan:  - Coumadin 7.5 mg po x 1 - F/u daily INR   Thank you for allowing pharmacy to be a part of this patients care team.  Lovenia Kim Pharm.D., BCPS Clinical Pharmacist 02/16/2012 9:28 AM Pager: [phone] Phone: [phone]    Allergies  Allergen Reactions  . Morphine And Related Other (See Comments)    Very aggressive and doesn't help pain    Vital Signs: Temp: 99 F (37.2 C) (10/17 0639) BP: 150/75 mmHg (10/17 0639) Pulse Rate: 98  (10/17 0639)  Labs:  Basename 02/16/12 0640 02/15/12 0631 02/14/12 0621  HGB 9.5* 9.1* --  HCT 31.0* 29.6* --  PLT 210 227 --  APTT -- -- 32  LABPROT 16.5* 14.3 --  INR 1.37 1.13 --  HEPARINUNFRC -- -- --  CREATININE -- 0.76 --  CKTOTAL -- -- --  CKMB -- -- --  TROPONINI -- -- --    Estimated Creatinine Clearance: 97 ml/min (by C-G formula based on Cr of 0.76).   Medical History: Past Medical History  Diagnosis Date  . Allergy     seasonal  . Arthritis   . Diabetes mellitus   . GERD (gastroesophageal reflux disease)   . Hyperlipidemia   . Hypertension   .  History of pneumonia     15 years ago

## 2012-02-16 NOTE — Progress Notes (Signed)
PT Cancellation Note  Patient Details Name: Kaitlin Cantrell MRN: [id number] DOB: [birth year]   Cancelled Treatment:    Reason Eval/Treat Not Completed: Pain limiting ability to participate.  Pt felt that they had not been on the CPM enough for the day and preferred to stay on the  CPM.  Pt stated that she had been up and walking back and forth to the bathroom via (A) from family.  Pt also stated wanting to perform stair negotiation tomorrow and refused today.  Pelham Hennick, SPT Symeon Puleo 02/16/2012, 3:50 PM

## 2012-02-16 NOTE — Progress Notes (Signed)
I agree with the following treatment note after reviewing documentation.   Johnston, Harland Aguiniga Brynn   OTR/L Pager: [phone] Office: [phone] .   

## 2012-02-16 NOTE — Progress Notes (Signed)
Physical Therapy Progress Note   02/16/12 1300  PT Visit Information  Last PT Received On 02/16/12  Assistance Needed +1  PT Time Calculation  PT Start Time 1103  PT Stop Time 1134  PT Time Calculation (min) 31 min  Subjective Data  Subjective Pt in pain after being seen by doctor  Patient Stated Goal To return home  Precautions  Precautions Knee  Required Braces or Orthoses Knee Immobilizer - Left  Restrictions  Weight Bearing Restrictions Yes  LLE Weight Bearing WBAT  Cognition  Overall Cognitive Status Appears within functional limits for tasks assessed/performed  Arousal/Alertness Awake/alert  Orientation Level Appears intact for tasks assessed  Behavior During Session [facility] for tasks performed  Bed Mobility  Bed Mobility Not assessed  Transfers  Transfers Sit to Stand;Stand to Sit  Sit to Stand 4: Min guard;From chair/3-in-1;From toilet;With upper extremity assist  Stand to Sit 4: Min guard;With upper extremity assist;To chair/3-in-1;To toilet  Details for Transfer Assistance Cues for safety, proper hand placement, and proper technique.  Ambulation/Gait  Ambulation/Gait Assistance 4: Min guard  Ambulation Distance (Feet) 20 Feet  Assistive device Rolling walker  Ambulation/Gait Assistance Details Cues for proper technique, proper step sequence, and safety.  Gait Pattern Step-to pattern;Decreased stance time - left;Decreased stride length;Antalgic  Gait velocity decreased  Stairs No  Wheelchair Mobility  Wheelchair Mobility No  Balance  Balance Assessed No  Exercises  Exercises Total Joint  Total Joint Exercises  Ankle Circles/Pumps AROM;Both;10 reps  Quad Sets AROM;Left;5 reps  Heel Slides AAROM;Left;5 reps  PT - End of Session  Equipment Utilized During Treatment Gait belt;Left knee immobilizer  Activity Tolerance Patient tolerated treatment well  Patient left in chair;with call bell/phone within reach;with family/visitor present  Nurse Communication Mobility  status  PT - Assessment/Plan  Comments on Treatment Session Pt's family very involved and wanted to help throughout therapy.  Plan for next therapy session is to negotiate stairs and increase ambulation distance.  PT Plan Discharge plan remains appropriate;Frequency remains appropriate  PT Frequency 7X/week  Recommendations for Other Services Other (comment) (none)  Follow Up Recommendations Home health PT;Supervision - Intermittent  Equipment Recommended Rolling walker with 5" wheels;3 in 1 bedside comode   Pain 5/10 L LE.  Amy DiTommaso, SPT [place], [place] DPT [phone]

## 2012-02-16 NOTE — Progress Notes (Signed)
Occupational Therapy Treatment Patient Details Name: Kaitlin Cantrell MRN: [id number] DOB: 1955/12/19 Today's Date: 02/16/2012 Time: 4098-1191 OT Time Calculation (min): 11 min  OT Assessment / Plan / Recommendation Comments on Treatment Session Pt. progressing very well, is supervision level for transfers and ambulation as well as for ADL tasks. No futher acute OT needs at this time. OT to sign off    Follow Up Recommendations  Supervision/Assistance - 24 hour;No OT follow up    Barriers to Discharge       Equipment Recommendations  Rolling walker with 5" wheels;Tub/shower seat    Recommendations for Other Services    Frequency Min 2X/week   Plan All goals met and education completed, patient discharged from OT services    Precautions / Restrictions Precautions Precautions: Knee Required Braces or Orthoses: Knee Immobilizer - Left Restrictions Weight Bearing Restrictions: Yes LLE Weight Bearing: Weight bearing as tolerated   Pertinent Vitals/Pain No pain reported     ADL  Eating/Feeding: Performed;Independent Where Assessed - Eating/Feeding: Chair Grooming: Performed;Wash/dry hands;Supervision/safety Where Assessed - Grooming: Unsupported standing Toilet Transfer: Research scientist (life sciences) Method: Sit to Barista: Raised toilet seat with arms (or 3-in-1 over toilet) Toileting - Clothing Manipulation and Hygiene: Performed;Independent Where Assessed - Toileting Clothing Manipulation and Hygiene: Sit to stand from 3-in-1 or toilet Equipment Used: Knee Immobilizer;Rolling walker;Gait belt Transfers/Ambulation Related to ADLs: Pt didwell with RW during ambulation only required one vc for safe hand placement for stand<>sit transfer ADL Comments: Pt. is doing very well. Ambulated to bathroom with supervision able to perform toilet transfer at supervision level as well as grooming tasks standing at sink. Pt also simulated walk in shower  transfer with supervision and min vc for sequencing      OT Diagnosis:    OT Problem List:   OT Treatment Interventions:     OT Goals Acute Rehab OT Goals OT Goal Formulation: With patient Time For Goal Achievement: 02/22/12 Potential to Achieve Goals: Good ADL Goals Pt Will Perform Grooming: with supervision;Standing at sink ADL Goal: Grooming - Progress: Met Pt Will Transfer to Toilet: with supervision;Ambulation;with DME;Comfort height toilet ADL Goal: Toilet Transfer - Progress: Met Pt Will Perform Toileting - Clothing Manipulation: with supervision;Sitting on 3-in-1 or toilet;Standing ADL Goal: Toileting - Clothing Manipulation - Progress: Met Pt Will Perform Toileting - Hygiene: with supervision;Sit to stand from 3-in-1/toilet;Standing at 3-in-1/toilet ADL Goal: Toileting - Hygiene - Progress: Met Pt Will Perform Tub/Shower Transfer: Shower transfer;with supervision;Ambulation;with DME ADL Goal: Tub/Shower Transfer - Progress: Met  Visit Information  Last OT Received On: 02/16/12 Assistance Needed: +1    Subjective Data      Prior Functioning       Cognition  Overall Cognitive Status: Appears within functional limits for tasks assessed/performed Arousal/Alertness: Awake/alert Orientation Level: Oriented X4 / Intact Behavior During Session: [facility] for tasks performed    Mobility  Shoulder Instructions Bed Mobility Bed Mobility: Not assessed Transfers Transfers: Sit to Stand;Stand to Sit Sit to Stand: 5: Supervision;With upper extremity assist;From chair/3-in-1;With armrests;From toilet Stand to Sit: 5: Supervision;With armrests;To chair/3-in-1;To toilet;With upper extremity assist                 End of Session OT - End of Session Equipment Utilized During Treatment: Gait belt;Left knee immobilizer Activity Tolerance: Patient tolerated treatment well Patient left: in chair;with call bell/phone within reach;with family/visitor present Nurse Communication:  Mobility status  GO     [name] 02/16/2012, 12:07 PM

## 2012-02-16 NOTE — Progress Notes (Signed)
Subjective: Pt stable - pain controlled - cpm 40   Objective: Vital signs in last 24 hours: Temp:  [97.8 F (36.6 C)-99.1 F (37.3 C)] 99 F (37.2 C) (10/17 0639) Pulse Rate:  [98-100] 98  (10/17 0639) Resp:  [16-18] 18  (10/17 0639) BP: (150-171)/(66-75) 150/75 mmHg (10/17 0639) SpO2:  [96 %-99 %] 99 % (10/17 0639)  Intake/Output from previous day: 10/16 0701 - 10/17 0700 In: 840 [P.O.:840] Out: 1650 [Urine:1650] Intake/Output this shift:    Exam:  Neurovascular intact Sensation intact distally Intact pulses distally Dorsiflexion/Plantar flexion intact incision ok  Labs:  Basename 02/16/12 0640 02/15/12 0631  HGB 9.5* 9.1*    Basename 02/16/12 0640 02/15/12 0631  WBC 9.8 7.3  RBC 3.83* 3.63*  HCT 31.0* 29.6*  PLT 210 227    Basename 02/15/12 0631  NA 137  K 4.5  CL 104  CO2 26  BUN 8  CREATININE 0.76  GLUCOSE 93  CALCIUM 8.9    Basename 02/16/12 0640 02/15/12 0631  LABPT -- --  INR 1.37 1.13    Assessment/Plan: Pt progressing - plan dc Saturday - rx on chart   DEAN,GREGORY SCOTT 02/16/2012, 9:38 AM

## 2012-02-16 NOTE — Progress Notes (Signed)
Occupational Therapy Discharge Patient Details Name: Kaitlin Cantrell MRN: [id number] DOB: October 25, 1955 Today's Date: 02/16/2012 Time: 4098-1191 OT Time Calculation (min): 11 min  Patient discharged from OT services secondary to goals met and no further OT needs identified.  Please see latest therapy progress note for current level of functioning and progress toward goals.    Progress and discharge plan discussed with patient and/or caregiver: Patient/Caregiver agrees with plan  GO     [name] 02/16/2012, 12:07 PM

## 2012-02-16 NOTE — Progress Notes (Signed)
I agree with the following treatment note after reviewing documentation.   Johnston, Corie Allis Brynn   OTR/L Pager: [phone] Office: [phone] .   

## 2012-02-17 DIAGNOSIS — M1711 Unilateral primary osteoarthritis, right knee: Secondary | ICD-10-CM | POA: Diagnosis present

## 2012-02-17 LAB — CBC
MCH: 24.5 pg — ABNORMAL LOW (ref 26.0–34.0)
MCV: 80.4 fL (ref 78.0–100.0)
Platelets: 223 10*3/uL (ref 150–400)
RDW: 15 % (ref 11.5–15.5)

## 2012-02-17 LAB — GLUCOSE, CAPILLARY
Glucose-Capillary: 119 mg/dL — ABNORMAL HIGH (ref 70–99)
Glucose-Capillary: 151 mg/dL — ABNORMAL HIGH (ref 70–99)
Glucose-Capillary: 166 mg/dL — ABNORMAL HIGH (ref 70–99)

## 2012-02-17 MED ORDER — WARFARIN SODIUM 7.5 MG PO TABS
7.5000 mg | ORAL_TABLET | Freq: Once | ORAL | Status: AC
  Administered 2012-02-17: 7.5 mg via ORAL
  Filled 2012-02-17: qty 1

## 2012-02-17 NOTE — Progress Notes (Signed)
Physical Therapy Treatment Patient Details Name: Kaitlin Cantrell MRN: [id number] DOB: Feb 17, 1956 Today's Date: 02/17/2012 Time: 1191-4782 PT Time Calculation (min): 38 min  PT Assessment / Plan / Recommendation Comments on Treatment Session  Pt reporting her primary pain is located in her anterior L hip.  Pt avoiding hip flexion in sittin secondary to pain.  Anterior hip tender to palpation.  Notified Rn of pt pain.  Applied ice packs to knee and hip for pain relief.     Follow Up Recommendations  Home health PT;Supervision - Intermittent     Does the patient have the potential to tolerate intense rehabilitation     Barriers to Discharge        Equipment Recommendations  Rolling walker with 5" wheels;3 in 1 bedside comode    Recommendations for Other Services    Frequency 7X/week   Plan Discharge plan remains appropriate;Frequency remains appropriate    Precautions / Restrictions Precautions Precautions: Knee Precaution Comments: reviewed no pillow under knee Required Braces or Orthoses: Knee Immobilizer - Left Knee Immobilizer - Left: Other (comment) Restrictions Weight Bearing Restrictions: Yes LLE Weight Bearing: Weight bearing as tolerated   Pertinent Vitals/Pain 6-7/10 pain in hip and knee see comments above.      Mobility  Bed Mobility Bed Mobility: Not assessed Transfers Transfers: Sit to Stand;Stand to Sit;Stand Pivot Transfers Sit to Stand: 6: Modified independent (Device/Increase time);With upper extremity assist;From chair/3-in-1 Stand to Sit: 6: Modified independent (Device/Increase time);With upper extremity assist;To chair/3-in-1 Stand Pivot Transfers: 6: Modified independent (Device/Increase time) Ambulation/Gait Ambulation/Gait Assistance: 5: Supervision Ambulation Distance (Feet): 150 Feet Assistive device: Rolling walker Ambulation/Gait Assistance Details: Removed KI to allow pt flex L knee during L swing phase and minimize L hip hilke.  Pt  tolerated gait without KI well.   Gait Pattern: Step-to pattern;Decreased stance time - left;Decreased stride length;Decreased weight shift to left;Left hip hike Gait velocity: WFL Stairs: No Wheelchair Mobility Wheelchair Mobility: No    Exercises Total Joint Exercises Knee Flexion: 10 reps;Seated;Left   PT Diagnosis:    PT Problem List:   PT Treatment Interventions:     PT Goals Acute Rehab PT Goals PT Goal Formulation: With patient/family Time For Goal Achievement: 02/22/12 Potential to Achieve Goals: Good Pt will go Sit to Stand: with modified independence PT Goal: Sit to Stand - Progress: Met Pt will go Stand to Sit: with modified independence PT Goal: Stand to Sit - Progress: Met Pt will Ambulate: >150 feet;with modified independence;with rolling walker PT Goal: Ambulate - Progress: Progressing toward goal Pt will Go Up / Down Stairs: 3-5 stairs;with rail(s);with supervision Pt will Perform Home Exercise Program: Independently  Visit Information  Last PT Received On: 02/17/12    Subjective Data  Subjective: I think that work out this morning did me some good.   Patient Stated Goal: To return home   Cognition  Overall Cognitive Status: Appears within functional limits for tasks assessed/performed Arousal/Alertness: Awake/alert Orientation Level: Appears intact for tasks assessed Behavior During Session: [facility] for tasks performed    Balance     End of Session PT - End of Session Equipment Utilized During Treatment: Gait belt;Left knee immobilizer Activity Tolerance: Patient tolerated treatment well Patient left: in chair;with call bell/phone within reach;with family/visitor present Nurse Communication: Mobility status;Patient requests pain meds;Other (comment) (Pt with pain in L hip)   GP     Itza Maniaci 02/17/2012, 4:45 PM Zophia Marrone L. Jordanny Waddington DPT [phone]

## 2012-02-17 NOTE — Progress Notes (Signed)
Patient evaluated for long-term disease management services with [facility] Care Management Program as benefit of her [facility] insurance. Explained services to patient and family at bedside. Informed Mrs Deryke that she will receive a post discharge transition of care call and will be evaluated for monthly home visits for assessments and for education. Patient states she plans to discharge home with [facility] services. Left [facility] Care Management packet at bedside. Appreciative of visit.   Raiford Noble, MSN-Ed, RN,BSN [facility] Liaison [phone]

## 2012-02-17 NOTE — Progress Notes (Signed)
ANTICOAGULATION CONSULT NOTE - Initial Consult  Pharmacy Consult for coumadin Indication: VTE prophylaxis  Assessment: 80 YOF with left knee arthritis, s/p total knee arthroplasty on coumadin for VTE prophylaxis. No anticoagulation prior to admission. INR 1.5 today trending up on 7.5mg  daily, no bleeding noted per chart. Plan for d/c home Saturday  Goal of Therapy:  INR 2-3 Monitor platelets by anticoagulation protocol: Yes   Plan:  - Coumadin 7.5 mg po x 1 - F/u daily INR  Bayard Hugger, PharmD, BCPS  Clinical Pharmacist  Pager: [phone]   Allergies  Allergen Reactions  . Morphine And Related Other (See Comments)    Very aggressive and doesn't help pain    Vital Signs: Temp: 98.2 F (36.8 C) (10/18 0635) BP: 142/68 mmHg (10/18 0635) Pulse Rate: 78  (10/18 0635)  Labs:  Basename 02/17/12 0615 02/16/12 0640 02/15/12 0631  HGB 8.9* 9.5* --  HCT 29.2* 31.0* 29.6*  PLT 223 210 227  APTT -- -- --  LABPROT 17.7* 16.5* 14.3  INR 1.50* 1.37 1.13  HEPARINUNFRC -- -- --  CREATININE -- -- 0.76  CKTOTAL -- -- --  CKMB -- -- --  TROPONINI -- -- --    Estimated Creatinine Clearance: 97 ml/min (by C-G formula based on Cr of 0.76).   Medical History: Past Medical History  Diagnosis Date  . Allergy     seasonal  . Arthritis   . Diabetes mellitus   . GERD (gastroesophageal reflux disease)   . Hyperlipidemia   . Hypertension   . History of pneumonia     15 years ago

## 2012-02-17 NOTE — Progress Notes (Signed)
Physical Therapy Treatment Patient Details Name: Kaitlin Cantrell MRN: [id number] DOB: 03-10-1956 Today's Date: 02/17/2012 Time: 4098-1191 PT Time Calculation (min): 53 min  PT Assessment / Plan / Recommendation Comments on Treatment Session  Pt mobility and activity tolerance much improved.  Pt will continue to be followed by PT in preparation for d/c to home tomorrow.     Follow Up Recommendations  Home health PT;Supervision - Intermittent     Does the patient have the potential to tolerate intense rehabilitation     Barriers to Discharge        Equipment Recommendations  Rolling walker with 5" wheels;3 in 1 bedside comode    Recommendations for Other Services Other (comment)  Frequency 7X/week   Plan Discharge plan remains appropriate;Frequency remains appropriate    Precautions / Restrictions Precautions Precautions: Knee Precaution Comments: reviewed no pillow under knee Required Braces or Orthoses: Knee Immobilizer - Left Knee Immobilizer - Left: Other (comment) Restrictions Weight Bearing Restrictions: Yes LLE Weight Bearing: Weight bearing as tolerated   Pertinent Vitals/Pain Pt reporting pain in knee 6/10     Mobility  Bed Mobility Bed Mobility: Sit to Supine;Supine to Sit Supine to Sit: 4: Min guard;HOB flat Sitting - Scoot to Edge of Bed: 4: Min guard Sit to Supine: 4: Min guard;HOB flat Details for Bed Mobility Assistance: Instructed pt to use leg lifter to manage L LE with minimal pain.  Transfers Transfers: Sit to Stand;Stand to Sit;Stand Pivot Transfers Sit to Stand: 5: Supervision;From bed;From chair/3-in-1;4: Min guard;With upper extremity assist Stand to Sit: 4: Min guard;To bed;To chair/3-in-1;With upper extremity assist Stand Pivot Transfers: 4: Min guard Details for Transfer Assistance: Cues to sit on edge of chair vs sitting back in chair.  Cues to position L LE forward prior to attempting to sit to minimize pain in knee.   Ambulation/Gait Ambulation/Gait Assistance: 4: Min guard Ambulation Distance (Feet): 100 Feet Assistive device: Rolling walker Ambulation/Gait Assistance Details: Cues to decrease dependence on UEs.  L hip hike pelvic assymetry noted.   Gait Pattern: Step-to pattern;Decreased stance time - left;Decreased stride length;Decreased weight shift to left;Left hip hike Gait velocity: WFL Stairs: Yes Stairs Assistance: 4: Min assist Stairs Assistance Details (indicate cue type and reason): Instructed pt, spouse and daughter on proper technique to ascend/descend stairs with 1 rail and RW.  Instructed family on proper technique to assist pt.  Stair Management Technique: One rail Right;Step to pattern;Forwards;With walker Number of Stairs: 5     Exercises Total Joint Exercises Ankle Circles/Pumps: AROM;Both;10 reps Quad Sets: AROM;Left;5 reps Short Arc Quad: 5 reps;Left;AAROM;Supine Heel Slides: AAROM;Left;5 reps Straight Leg Raises: AAROM;Left;5 reps;Seated Goniometric ROM: 0-70 degrees AAROM   PT Diagnosis:    PT Problem List:   PT Treatment Interventions:     PT Goals Acute Rehab PT Goals PT Goal Formulation: With patient/family Time For Goal Achievement: 02/22/12 Potential to Achieve Goals: Good Pt will go Supine/Side to Sit: with modified independence PT Goal: Supine/Side to Sit - Progress: Progressing toward goal Pt will go Sit to Supine/Side: with modified independence PT Goal: Sit to Supine/Side - Progress: Progressing toward goal Pt will go Sit to Stand: with modified independence PT Goal: Sit to Stand - Progress: Progressing toward goal Pt will go Stand to Sit: with modified independence PT Goal: Stand to Sit - Progress: Progressing toward goal Pt will Ambulate: >150 feet;with modified independence;with rolling walker PT Goal: Ambulate - Progress: Progressing toward goal Pt will Go Up / Down Stairs: 3-5 stairs;with rail(s);with  supervision PT Goal: Up/Down Stairs - Progress:  Progressing toward goal Pt will Perform Home Exercise Program: Independently PT Goal: Perform Home Exercise Program - Progress: Progressing toward goal  Visit Information  Last PT Received On: 02/17/12    Subjective Data  Subjective: I have not done any of the exercises except ankle pumps Patient Stated Goal: To return home   Cognition  Overall Cognitive Status: Appears within functional limits for tasks assessed/performed Arousal/Alertness: Awake/alert Orientation Level: Appears intact for tasks assessed Behavior During Session: [facility] for tasks performed    Balance     End of Session PT - End of Session Equipment Utilized During Treatment: Gait belt;Left knee immobilizer Activity Tolerance: Patient tolerated treatment well Patient left: in bed;in CPM;with call bell/phone within reach;with family/visitor present Nurse Communication: Mobility status   GP     Aliviah Spain 02/17/2012, 1:08 PM Nataleah Scioneaux L. Laron Angelini DPT [phone]

## 2012-02-18 LAB — PROTIME-INR
INR: 1.61 — ABNORMAL HIGH (ref 0.00–1.49)
Prothrombin Time: 18.6 seconds — ABNORMAL HIGH (ref 11.6–15.2)

## 2012-02-18 MED ORDER — WARFARIN SODIUM 10 MG PO TABS
10.0000 mg | ORAL_TABLET | Freq: Once | ORAL | Status: DC
  Filled 2012-02-18: qty 1

## 2012-02-18 NOTE — Progress Notes (Signed)
ANTICOAGULATION CONSULT NOTE - Initial Consult  Pharmacy Consult for coumadin Indication: VTE prophylaxis  Assessment: 37 YOF with left knee arthritis, s/p total knee arthroplasty on coumadin for VTE prophylaxis. No anticoagulation prior to admission. INR 1.61 today trending up slowly on 7.5mg  daily, no bleeding noted per chart.   Goal of Therapy:  INR 2-3 Monitor platelets by anticoagulation protocol: Yes   Plan:  - Coumadin 10 mg po x 1 - F/u daily INR  Bayard Hugger, PharmD, BCPS  Clinical Pharmacist  Pager: [phone]   Allergies  Allergen Reactions  . Morphine And Related Other (See Comments)    Very aggressive and doesn't help pain    Vital Signs: Temp: 97.8 F (36.6 C) (10/19 0638) BP: 140/64 mmHg (10/19 0638) Pulse Rate: 94  (10/19 0638)  Labs:  Basename 02/18/12 0630 02/17/12 0615 02/16/12 0640  HGB -- 8.9* 9.5*  HCT -- 29.2* 31.0*  PLT -- 223 210  APTT -- -- --  LABPROT 18.6* 17.7* 16.5*  INR 1.61* 1.50* 1.37  HEPARINUNFRC -- -- --  CREATININE -- -- --  CKTOTAL -- -- --  CKMB -- -- --  TROPONINI -- -- --    Estimated Creatinine Clearance: 97 ml/min (by C-G formula based on Cr of 0.76).   Medical History: Past Medical History  Diagnosis Date  . Allergy     seasonal  . Arthritis   . Diabetes mellitus   . GERD (gastroesophageal reflux disease)   . Hyperlipidemia   . Hypertension   . History of pneumonia     15 years ago

## 2012-02-18 NOTE — Discharge Summary (Signed)
Physician Discharge Summary  Patient ID: Kaitlin Cantrell MRN: [id number] DOB/AGE: 05-22-1955 56 y.o.  Admit date: 02/14/2012 Discharge date: 02/18/2012  Admission Diagnoses: Osteoarthritis left knee Discharge Diagnoses: Osteoarthritis left knee  Principal Problem:  *Osteoarthritis of left knee   Discharged Condition: stable  Hospital Course: Patient's hospital course was essentially unremarkable. She underwent total knee arthroplasty progressed well with physical therapy and was discharged to home in stable condition.  Consults: None  Significant Diagnostic Studies: labs: Routine labs  Treatments: surgery: See operative note  Discharge Exam: Blood pressure 140/64, pulse 94, temperature 97.8 F (36.6 C), temperature source Oral, resp. rate 18, height 5\' 6"  (1.676 m), weight 102.059 kg (225 lb), SpO2 95.00%. Incision/Wound: incision clean dry and intact at time of discharge  Disposition: Final discharge disposition not confirmed  Discharge Orders    Future Orders Please Complete By Expires   Diet - low sodium heart healthy      Diet - low sodium heart healthy      Diet - low sodium heart healthy      Call MD / Call 911      Comments:   If you experience chest pain or shortness of breath, CALL 911 and be transported to the hospital emergency room.  If you develope a fever above 101 F, pus (white drainage) or increased drainage or redness at the wound, or calf pain, call your surgeon's office.   Constipation Prevention      Comments:   Drink plenty of fluids.  Prune juice may be helpful.  You may use a stool softener, such as Colace (over the counter) 100 mg twice a day.  Use MiraLax (over the counter) for constipation as needed.   Increase activity slowly as tolerated      Discharge instructions      Comments:   1. CPM 6 hours per day 2. Keep incision dry 3.Weight bearing as tolerated with crutches   Call MD / Call 911      Comments:   If you experience chest pain or  shortness of breath, CALL 911 and be transported to the hospital emergency room.  If you develope a fever above 101 F, pus (white drainage) or increased drainage or redness at the wound, or calf pain, call your surgeon's office.   Constipation Prevention      Comments:   Drink plenty of fluids.  Prune juice may be helpful.  You may use a stool softener, such as Colace (over the counter) 100 mg twice a day.  Use MiraLax (over the counter) for constipation as needed.   Increase activity slowly as tolerated      Discharge instructions      Comments:   Change dressing daily or as needed.  Ice pack as needed for pain.  Walk as tolerated.  Work on range of motion,stetching and strengthening exercises daily   Do not put a pillow under the knee. Place it under the heel.      Call MD / Call 911      Comments:   If you experience chest pain or shortness of breath, CALL 911 and be transported to the hospital emergency room.  If you develope a fever above 101 F, pus (white drainage) or increased drainage or redness at the wound, or calf pain, call your surgeon's office.   Constipation Prevention      Comments:   Drink plenty of fluids.  Prune juice may be helpful.  You may use a stool softener,  such as Colace (over the counter) 100 mg twice a day.  Use MiraLax (over the counter) for constipation as needed.   Increase activity slowly as tolerated          Medication List     As of 02/18/2012  9:39 AM    STOP taking these medications         HYDROcodone-acetaminophen 5-325 MG per tablet   Commonly known as: NORCO/VICODIN      naproxen sodium 220 MG tablet   Commonly known as: ANAPROX      TAKE these medications         aspirin EC 81 MG tablet   Take 81 mg by mouth daily.      benazepril 40 MG tablet   Commonly known as: LOTENSIN   Take 40 mg by mouth every evening.      DSS 100 MG Caps   Take 100 mg by mouth 2 (two) times daily.      glimepiride 2 MG tablet   Commonly known as: AMARYL     Take 2 mg by mouth 2 (two) times daily.      hydrochlorothiazide 25 MG tablet   Commonly known as: HYDRODIURIL   Take 25 mg by mouth daily.      Krill Oil Caps   Take 1 capsule by mouth 2 (two) times daily.      metFORMIN 850 MG tablet   Commonly known as: GLUCOPHAGE   Take 850 mg by mouth 2 (two) times daily with a meal.      methocarbamol 500 MG tablet   Commonly known as: ROBAXIN   Take 1 tablet (500 mg total) by mouth every 6 (six) hours as needed.      Misc Intestinal Flora Regulat Chew   Chew 1 tablet by mouth daily.      omeprazole 20 MG capsule   Commonly known as: PRILOSEC   Take 20 mg by mouth daily.      oxyCODONE 5 MG immediate release tablet   Commonly known as: Oxy IR/ROXICODONE   Take 1-2 tablets (5-10 mg total) by mouth every 3 (three) hours as needed.      OxyCODONE 10 mg Tb12   Commonly known as: OXYCONTIN   Take 1 tablet (10 mg total) by mouth every 12 (twelve) hours.      pravastatin 40 MG tablet   Commonly known as: PRAVACHOL   Take 40 mg by mouth at bedtime.      traMADol 50 MG tablet   Commonly known as: ULTRAM   Take 100 mg by mouth 3 (three) times daily. Maximum dose= 8 tablets per day      warfarin 5 MG tablet   Commonly known as: COUMADIN   Take 1 tablet (5 mg total) by mouth daily.           Follow-up Information    Follow up with Cammy Copa, MD. Schedule an appointment as soon as possible for a visit in 2 weeks.   Contact information:   [address] [place] [address] [phone]          Signed: Nadara Mustard 02/18/2012, 9:39 AM

## 2012-02-18 NOTE — Progress Notes (Signed)
Physical Therapy Treatment Patient Details Name: Kaitlin Cantrell MRN: [id number] DOB: 07/07/1955 Today's Date: 02/18/2012 Time: 4098-1191 PT Time Calculation (min): 27 min  PT Assessment / Plan / Recommendation Comments on Treatment Session  Pt continues to report anterior hip pain especially with SLR and hip abduction.  Pt able to complete stair negotiation and ready to d/c home.    Follow Up Recommendations  Home health PT;Supervision - Intermittent     Does the patient have the potential to tolerate intense rehabilitation   NONE  Barriers to Discharge  NONE      Equipment Recommendations  Rolling walker with 5" wheels;3 in 1 bedside comode    Recommendations for Other Services Other (comment)  Frequency 7X/week   Plan Discharge plan remains appropriate;Frequency remains appropriate    Precautions / Restrictions Precautions Precautions: Knee Precaution Comments: reviewed no pillow under knee Required Braces or Orthoses: Knee Immobilizer - Left Knee Immobilizer - Left: Other (comment) Restrictions Weight Bearing Restrictions: Yes LLE Weight Bearing: Weight bearing as tolerated   Pertinent Vitals/Pain 4/10 left knee & anterior hip pain    Mobility  Bed Mobility Bed Mobility: Not assessed Transfers Transfers: Sit to Stand;Stand to Sit;Stand Pivot Transfers Sit to Stand: 6: Modified independent (Device/Increase time);With upper extremity assist;From chair/3-in-1 Stand to Sit: 6: Modified independent (Device/Increase time);With upper extremity assist;To chair/3-in-1 Stand Pivot Transfers: 6: Modified independent (Device/Increase time) Ambulation/Gait Ambulation/Gait Assistance: 5: Supervision Ambulation Distance (Feet): 150 Feet Assistive device: Rolling walker Ambulation/Gait Assistance Details: VCs for RW placement Gait Pattern: Step-to pattern;Decreased stance time - left;Decreased stride length;Decreased weight shift to left;Left hip hike Gait velocity:  WFL Stairs: Yes Stairs Assistance: 4: Min assist Stairs Assistance Details (indicate cue type and reason): (A) to manage RW and maintain balance with RW Stair Management Technique: One rail Right;Forwards;With walker (Pt able to complete with RW however recommend cane) Number of Stairs: 5  Wheelchair Mobility Wheelchair Mobility: No    Exercises Total Joint Exercises Ankle Circles/Pumps: AROM;Both;10 reps Quad Sets: AROM;Left;5 reps Short Arc Quad: 5 reps;Left;AAROM;Supine Heel Slides: AAROM;Left;5 reps   PT Diagnosis:    PT Problem List:   PT Treatment Interventions:     PT Goals Acute Rehab PT Goals PT Goal Formulation: With patient/family Time For Goal Achievement: 02/22/12 Potential to Achieve Goals: Good Pt will go Sit to Stand: with modified independence PT Goal: Sit to Stand - Progress: Met Pt will go Stand to Sit: with modified independence PT Goal: Stand to Sit - Progress: Met Pt will Ambulate: >150 feet;with modified independence;with rolling walker PT Goal: Ambulate - Progress: Progressing toward goal Pt will Go Up / Down Stairs: 3-5 stairs;with rail(s);with supervision PT Goal: Up/Down Stairs - Progress: Progressing toward goal Pt will Perform Home Exercise Program: Independently PT Goal: Perform Home Exercise Program - Progress: Progressing toward goal  Visit Information  Last PT Received On: 02/18/12    Subjective Data  Subjective: I feel good about going home. Patient Stated Goal: To return home   Cognition  Overall Cognitive Status: Appears within functional limits for tasks assessed/performed Arousal/Alertness: Awake/alert Orientation Level: Appears intact for tasks assessed Behavior During Session: [facility] for tasks performed    Balance     End of Session PT - End of Session Equipment Utilized During Treatment: Gait belt;Left knee immobilizer Activity Tolerance: Patient tolerated treatment well Patient left: in chair;with call bell/phone within  reach;with family/visitor present Nurse Communication: Mobility status;Patient requests pain meds;Other (comment)   GP     [name] 02/18/2012, 8:52  AM Jake Shark, PT DPT [phone]

## 2012-02-18 NOTE — Progress Notes (Signed)
02/18/2012 1730 NCM contacted pt at home and states she did not receive her RW prior to d/c. NCM faxed referral to [facility] for deliver of DME to pts home. No with no delivery charge to pt.  Pt states she has [place] contact number. Notified Gen[facility] rep, Vernona Rieger that pt scheduled d/c home today. Provided rep with orders for [facility] PT.  Isidoro Donning RN CCM Case Mgmt phone [phone]

## 2012-02-19 NOTE — Care Management Note (Signed)
    Page 1 of 2   02/19/2012     6:04:37 PM   CARE MANAGEMENT NOTE 02/19/2012  Patient:  Kaitlin Cantrell, Kaitlin Cantrell   Account Number:  [id number]  Date Initiated:  02/15/2012  Documentation initiated by:  Vance Peper  Subjective/Objective Assessment:   56 yr old female s/p left total knee arthroplasty.     Action/Plan:   progression of care and discharge planning.CM spoke with patient regarding home health and DME needs at discharge.patient preoperatively setup with [facility], no changes. Has rolling walker, CPM .   Anticipated DC Date:  02/16/2012   Anticipated DC Plan:  HOME W HOME HEALTH SERVICES      DC Planning Services  CM consult      [facility] Choice  HOME HEALTH   Choice offered to / List presented to:  C-1 Patient   DME arranged  WALKER - ROLLING        HH arranged  HH-1 RN  HH-2 PT      [facility] agency  [facility]   Status of service:  Completed, signed off Medicare Important Message given?   (If response is "NO", the following Medicare IM given date fields will be blank) Date Medicare IM given:   Date Additional Medicare IM given:    Discharge Disposition:  HOME W HOME HEALTH SERVICES  Per UR Regulation:    If discussed at Long Length of Stay Meetings, dates discussed:    Comments:  02/19/2012 1750 NCM spoke to pt to make them aware that [facility] states she had RW within last five years covered by insurance. Pt states she has never had RW. Will fax order to [facility] for RW. NCM will follow up with [facility] and pt on tomorrow. Isidoro Donning RN CCM Case Mgmt phone [phone]  02/18/2012 1730 NCM contacted pt at home and states she did not receive her RW prior to d/c. NCM faxed referral to [facility] for deliver of DME to pts home. No with no delivery charge to pt.  Pt states she has [place] contact number. Notified [facility] rep, Vernona Rieger that pt scheduled d/c home today. Provided rep with orders for [facility] PT.  Isidoro Donning RN CCM Case Mgmt phone [phone]  02/15/12  Vance Peper, RN BSN Case Manager Patient will be going to her mother's home at discharge:[address] [place], [place] [address] [phone].

## 2012-03-30 ENCOUNTER — Ambulatory Visit (INDEPENDENT_AMBULATORY_CARE_PROVIDER_SITE_OTHER): Payer: BLUE CROSS/BLUE SHIELD | Admitting: Family Medicine

## 2012-03-30 VITALS — BP 130/80 | HR 96 | Temp 97.9°F | Resp 20 | Ht 66.0 in | Wt 220.8 lb

## 2012-03-30 DIAGNOSIS — E785 Hyperlipidemia, unspecified: Secondary | ICD-10-CM

## 2012-03-30 DIAGNOSIS — M545 Low back pain, unspecified: Secondary | ICD-10-CM

## 2012-03-30 DIAGNOSIS — I10 Essential (primary) hypertension: Secondary | ICD-10-CM

## 2012-03-30 DIAGNOSIS — Z23 Encounter for immunization: Secondary | ICD-10-CM

## 2012-03-30 DIAGNOSIS — Z79899 Other long term (current) drug therapy: Secondary | ICD-10-CM | POA: Insufficient documentation

## 2012-03-30 DIAGNOSIS — E1129 Type 2 diabetes mellitus with other diabetic kidney complication: Secondary | ICD-10-CM | POA: Insufficient documentation

## 2012-03-30 DIAGNOSIS — E119 Type 2 diabetes mellitus without complications: Secondary | ICD-10-CM

## 2012-03-30 LAB — COMPREHENSIVE METABOLIC PANEL
AST: 11 U/L (ref 0–37)
Albumin: 4.2 g/dL (ref 3.5–5.2)
Alkaline Phosphatase: 70 U/L (ref 39–117)
BUN: 21 mg/dL (ref 6–23)
Potassium: 5 mEq/L (ref 3.5–5.3)
Total Bilirubin: 0.2 mg/dL — ABNORMAL LOW (ref 0.3–1.2)

## 2012-03-30 MED ORDER — BENAZEPRIL HCL 40 MG PO TABS: 40.0000 mg | ORAL_TABLET | Freq: Every evening | ORAL | Status: DC

## 2012-03-30 MED ORDER — HYDROCODONE-ACETAMINOPHEN 7.5-325 MG PO TABS: 1.0000 | ORAL_TABLET | Freq: Four times a day (QID) | ORAL | Status: DC | PRN

## 2012-03-30 MED ORDER — METFORMIN HCL 850 MG PO TABS: 850.0000 mg | ORAL_TABLET | Freq: Two times a day (BID) | ORAL | Status: DC

## 2012-03-30 MED ORDER — PRAVASTATIN SODIUM 40 MG PO TABS: 40.0000 mg | ORAL_TABLET | Freq: Every day | ORAL | Status: DC

## 2012-03-30 MED ORDER — GLIMEPIRIDE 2 MG PO TABS: 2.0000 mg | ORAL_TABLET | Freq: Two times a day (BID) | ORAL | Status: DC

## 2012-03-30 MED ORDER — TRAMADOL HCL 50 MG PO TABS: 100.0000 mg | ORAL_TABLET | Freq: Three times a day (TID) | ORAL | Status: DC | PRN

## 2012-03-30 MED ORDER — HYDROCHLOROTHIAZIDE 25 MG PO TABS: 25.0000 mg | ORAL_TABLET | Freq: Every day | ORAL | Status: DC

## 2012-03-30 NOTE — Progress Notes (Signed)
Subjective:    Patient ID: [name], female    DOB: 01-06-1956, 56 y.o.   MRN: [id number]  HPI  Has not had dm checked since hosp. Last a1c 11/07/11 6.3. Kaitlin Cantrell is doing very well. Had total knee replacement last mo and is recovering nicely - starting to be able to get around a lot more and back to normal life.      Review of Systems  Constitutional: Positive for fatigue. Negative for fever, chills, diaphoresis and appetite change.  Eyes: Negative for visual disturbance.  Respiratory: Negative for cough and shortness of breath.   Cardiovascular: Negative for chest pain, palpitations and leg swelling.  Genitourinary: Negative for decreased urine volume.  Musculoskeletal: Positive for back pain, arthralgias and gait problem.  Neurological: Negative for syncope and headaches.  Hematological: Does not bruise/bleed easily.      BP 130/80  Pulse 96  Temp 97.9 F (36.6 C) (Oral)  Resp 20  Ht 5\' 6"  (1.676 m)  Wt 220 lb 12.8 oz (100.154 kg)  BMI 35.64 kg/m2  SpO2 95% Objective:   Physical Exam  Constitutional: She is oriented to person, place, and time. She appears well-developed and well-nourished. No distress.  HENT:  Head: Normocephalic and atraumatic.  Right Ear: External ear normal.  Left Ear: External ear normal.  Eyes: Conjunctivae normal are normal. No scleral icterus.  Neck: Normal range of motion. Neck supple. No thyromegaly present.  Cardiovascular: Normal rate, regular rhythm, normal heart sounds and intact distal pulses.   Pulmonary/Chest: Effort normal. No respiratory distress. She has wheezes. She has rhonchi.  Musculoskeletal: She exhibits no edema.  Lymphadenopathy:    She has no cervical adenopathy.  Neurological: She is alert and oriented to person, place, and time.  Skin: Skin is warm and dry. She is not diaphoretic. No erythema.  Psychiatric: She has a normal mood and affect. Her behavior is normal.     Results for orders placed in visit  on 03/30/12  POCT GLYCOSYLATED HEMOGLOBIN (HGB A1C)      Component Value Range   Hemoglobin A1C 6.1          Assessment & Plan:   1. Encounter for long-term (current) use of other medications  Comprehensive metabolic panel  2. Type II or unspecified type diabetes mellitus without mention of complication, not stated as uncontrolled  POCT glycosylated hemoglobin (Hb A1C), metFORMIN (GLUCOPHAGE) 850 MG tablet, glimepiride (AMARYL) 2 MG tablet  3. HTN (hypertension)  benazepril (LOTENSIN) 40 MG tablet, hydrochlorothiazide (HYDRODIURIL) 25 MG tablet  4. Lumbago  HYDROcodone-acetaminophen (NORCO) 7.5-325 MG per tablet, traMADol (ULTRAM) 50 MG tablet  5. Hyperlipidemia LDL goal < 100  pravastatin (PRAVACHOL) 40 MG tablet  6. Influenza vaccine needed  Flu vaccine greater than or equal to 3yo preservative free IM   Meds ordered this encounter  Medications  . HYDROcodone-acetaminophen (NORCO) 7.5-325 MG per tablet    Sig: Take 1 tablet by mouth every 6 (six) hours as needed for pain.    Dispense:  90 tablet    Refill:  2  . benazepril (LOTENSIN) 40 MG tablet    Sig: Take 1 tablet (40 mg total) by mouth every evening.    Dispense:  90 tablet    Refill:  3  . traMADol (ULTRAM) 50 MG tablet    Sig: Take 2 tablets (100 mg total) by mouth every 8 (eight) hours as needed for pain. Maximum dose= 8 tablets per day    Dispense:  240 tablet  Refill:  2  . metFORMIN (GLUCOPHAGE) 850 MG tablet    Sig: Take 1 tablet (850 mg total) by mouth 2 (two) times daily with a meal.    Dispense:  180 tablet    Refill:  1  . hydrochlorothiazide (HYDRODIURIL) 25 MG tablet    Sig: Take 1 tablet (25 mg total) by mouth daily.    Dispense:  90 tablet    Refill:  3  . glimepiride (AMARYL) 2 MG tablet    Sig: Take 1 tablet (2 mg total) by mouth 2 (two) times daily.    Dispense:  180 tablet    Refill:  1  . pravastatin (PRAVACHOL) 40 MG tablet    Sig: Take 1 tablet (40 mg total) by mouth at bedtime.    Dispense:   90 tablet    Refill:  3

## 2012-04-20 ENCOUNTER — Telehealth: Payer: Self-pay

## 2012-04-20 NOTE — Telephone Encounter (Signed)
Called patient to advise.  Left message

## 2012-04-20 NOTE — Telephone Encounter (Signed)
Can we do this without visit? Should I advise her to come in, or should I advise her to call the dentist?

## 2012-04-20 NOTE — Telephone Encounter (Signed)
She called back and was advised.

## 2012-04-20 NOTE — Telephone Encounter (Signed)
If the dentist wants her on an antibiotic, the dentist needs to prescribe it

## 2012-04-20 NOTE — Telephone Encounter (Signed)
Pt is requesting a rx refill for antibiotics she has an abscess and her dentist would like her to take before he removes the abscess. [facility] [phone] pt [phone]

## 2012-06-08 ENCOUNTER — Ambulatory Visit: Payer: BLUE CROSS/BLUE SHIELD

## 2012-06-08 ENCOUNTER — Ambulatory Visit (INDEPENDENT_AMBULATORY_CARE_PROVIDER_SITE_OTHER): Payer: BLUE CROSS/BLUE SHIELD | Admitting: Family Medicine

## 2012-06-08 ENCOUNTER — Encounter: Payer: Self-pay | Admitting: Family Medicine

## 2012-06-08 VITALS — BP 136/77 | HR 101 | Temp 98.5°F | Resp 16 | Ht 65.5 in | Wt 209.0 lb

## 2012-06-08 DIAGNOSIS — R062 Wheezing: Secondary | ICD-10-CM

## 2012-06-08 DIAGNOSIS — R109 Unspecified abdominal pain: Secondary | ICD-10-CM

## 2012-06-08 DIAGNOSIS — K219 Gastro-esophageal reflux disease without esophagitis: Secondary | ICD-10-CM

## 2012-06-08 DIAGNOSIS — Z79899 Other long term (current) drug therapy: Secondary | ICD-10-CM

## 2012-06-08 DIAGNOSIS — M545 Low back pain: Secondary | ICD-10-CM

## 2012-06-08 DIAGNOSIS — IMO0001 Reserved for inherently not codable concepts without codable children: Secondary | ICD-10-CM

## 2012-06-08 DIAGNOSIS — J441 Chronic obstructive pulmonary disease with (acute) exacerbation: Secondary | ICD-10-CM

## 2012-06-08 DIAGNOSIS — M791 Myalgia, unspecified site: Secondary | ICD-10-CM

## 2012-06-08 LAB — POCT CBC
Granulocyte percent: 67 % (ref 37–80)
HCT, POC: 38.2 % (ref 37.7–47.9)
Hemoglobin: 11.4 g/dL — AB (ref 12.2–16.2)
Lymph, poc: 3.1 (ref 0.6–3.4)
MCH, POC: 23.3 pg — AB (ref 27–31.2)
MCHC: 29.8 g/dL — AB (ref 31.8–35.4)
MCV: 78.1 fL — AB (ref 80–97)
MID (cbc): 0.5 (ref 0–0.9)
MPV: 8 fL (ref 0–99.8)
POC Granulocyte: 7.3 — AB (ref 2–6.9)
POC LYMPH PERCENT: 28.3 % (ref 10–50)
POC MID %: 4.7 % (ref 0–12)
Platelet Count, POC: 481 K/uL — AB (ref 142–424)
RBC: 4.89 M/uL (ref 4.04–5.48)
RDW, POC: 15.7 %
WBC: 10.9 K/uL — AB (ref 4.6–10.2)

## 2012-06-08 LAB — POCT URINALYSIS DIPSTICK
Ketones, UA: NEGATIVE
Leukocytes, UA: NEGATIVE
Protein, UA: 100
Spec Grav, UA: >=1.03
pH, UA: 5

## 2012-06-08 LAB — MAGNESIUM: Magnesium: 2.1 mg/dL (ref 1.5–2.5)

## 2012-06-08 LAB — POCT UA - MICROSCOPIC ONLY: Crystals, Ur, HPF, POC: NEGATIVE

## 2012-06-08 MED ORDER — SUCRALFATE 1 G PO TABS: 1.0000 g | ORAL_TABLET | Freq: Four times a day (QID) | ORAL | Status: DC

## 2012-06-08 MED ORDER — LEVOFLOXACIN 500 MG PO TABS: 500.0000 mg | ORAL_TABLET | Freq: Every day | ORAL | Status: DC

## 2012-06-08 MED ORDER — OMEPRAZOLE 40 MG PO CPDR: 40.0000 mg | DELAYED_RELEASE_CAPSULE | Freq: Every day | ORAL | Status: DC

## 2012-06-08 MED ORDER — BECLOMETHASONE DIPROPIONATE 80 MCG/ACT IN AERS: 2.0000 | INHALATION_SPRAY | Freq: Two times a day (BID) | RESPIRATORY_TRACT | Status: DC

## 2012-06-08 MED ORDER — HYDROCODONE-ACETAMINOPHEN 7.5-325 MG PO TABS: 1.0000 | ORAL_TABLET | Freq: Four times a day (QID) | ORAL | Status: DC | PRN

## 2012-06-08 MED ORDER — IPRATROPIUM-ALBUTEROL 20-100 MCG/ACT IN AERS: 1.0000 | INHALATION_SPRAY | Freq: Four times a day (QID) | RESPIRATORY_TRACT | Status: DC

## 2012-06-08 MED ORDER — CYCLOBENZAPRINE HCL 10 MG PO TABS: 10.0000 mg | ORAL_TABLET | Freq: Three times a day (TID) | ORAL | Status: DC | PRN

## 2012-06-08 MED ORDER — RANITIDINE HCL 150 MG PO TABS: 150.0000 mg | ORAL_TABLET | Freq: Two times a day (BID) | ORAL | Status: DC

## 2012-06-08 MED ORDER — TRAMADOL HCL 50 MG PO TABS: 100.0000 mg | ORAL_TABLET | Freq: Three times a day (TID) | ORAL | Status: DC | PRN
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
## 2012-06-09 LAB — COMPREHENSIVE METABOLIC PANEL
Albumin: 4.3 g/dL (ref 3.5–5.2)
CO2: 25 mEq/L (ref 19–32)
Calcium: 9.7 mg/dL (ref 8.4–10.5)
Chloride: 99 mEq/L (ref 96–112)
Glucose, Bld: 174 mg/dL — ABNORMAL HIGH (ref 70–99)
Potassium: 4.7 mEq/L (ref 3.5–5.3)
Sodium: 133 mEq/L — ABNORMAL LOW (ref 135–145)
Total Bilirubin: 0.3 mg/dL (ref 0.3–1.2)
Total Protein: 7.4 g/dL (ref 6.0–8.3)

## 2012-06-09 LAB — PHOSPHORUS: Phosphorus: 4.5 mg/dL (ref 2.3–4.6)

## 2012-07-06 ENCOUNTER — Ambulatory Visit: Payer: BLUE CROSS/BLUE SHIELD | Admitting: Family Medicine

## 2012-07-11 ENCOUNTER — Other Ambulatory Visit: Payer: Self-pay | Admitting: Radiology

## 2012-07-11 ENCOUNTER — Telehealth: Payer: Self-pay

## 2012-07-11 DIAGNOSIS — K219 Gastro-esophageal reflux disease without esophagitis: Secondary | ICD-10-CM

## 2012-07-11 NOTE — Telephone Encounter (Signed)
Please advise on renewal requests pended meds / Zantac and Flexeril

## 2012-07-12 MED ORDER — RANITIDINE HCL 150 MG PO TABS: 150.0000 mg | ORAL_TABLET | Freq: Two times a day (BID) | ORAL | Status: DC

## 2012-07-12 MED ORDER — CYCLOBENZAPRINE HCL 10 MG PO TABS: 10.0000 mg | ORAL_TABLET | Freq: Three times a day (TID) | ORAL | Status: DC | PRN

## 2012-09-07 ENCOUNTER — Ambulatory Visit (INDEPENDENT_AMBULATORY_CARE_PROVIDER_SITE_OTHER): Payer: BLUE CROSS/BLUE SHIELD | Admitting: Family Medicine

## 2012-09-07 ENCOUNTER — Encounter: Payer: Self-pay | Admitting: Family Medicine

## 2012-09-07 VITALS — BP 106/60 | HR 81 | Temp 98.1°F | Resp 16 | Ht 65.5 in | Wt 215.2 lb

## 2012-09-07 DIAGNOSIS — Z79899 Other long term (current) drug therapy: Secondary | ICD-10-CM

## 2012-09-07 DIAGNOSIS — M791 Myalgia, unspecified site: Secondary | ICD-10-CM

## 2012-09-07 DIAGNOSIS — I10 Essential (primary) hypertension: Secondary | ICD-10-CM

## 2012-09-07 DIAGNOSIS — M545 Low back pain, unspecified: Secondary | ICD-10-CM

## 2012-09-07 DIAGNOSIS — K219 Gastro-esophageal reflux disease without esophagitis: Secondary | ICD-10-CM

## 2012-09-07 DIAGNOSIS — IMO0001 Reserved for inherently not codable concepts without codable children: Secondary | ICD-10-CM

## 2012-09-07 DIAGNOSIS — E119 Type 2 diabetes mellitus without complications: Secondary | ICD-10-CM

## 2012-09-07 LAB — LIPID PANEL
Cholesterol: 167 mg/dL (ref 0–200)
HDL: 37 mg/dL — ABNORMAL LOW (ref >39)
Total CHOL/HDL Ratio: 4.5 Ratio
VLDL: 35 mg/dL (ref 0–40)

## 2012-09-07 LAB — COMPREHENSIVE METABOLIC PANEL
AST: 13 U/L (ref 0–37)
Albumin: 3.9 g/dL (ref 3.5–5.2)
Alkaline Phosphatase: 83 U/L (ref 39–117)
BUN: 15 mg/dL (ref 6–23)
Glucose, Bld: 115 mg/dL — ABNORMAL HIGH (ref 70–99)
Potassium: 4.6 mEq/L (ref 3.5–5.3)
Total Bilirubin: 0.3 mg/dL (ref 0.3–1.2)

## 2012-09-07 MED ORDER — OMEPRAZOLE 40 MG PO CPDR: 40.0000 mg | DELAYED_RELEASE_CAPSULE | Freq: Every day | ORAL | Status: DC

## 2012-09-07 MED ORDER — CYCLOBENZAPRINE HCL 10 MG PO TABS: 10.0000 mg | ORAL_TABLET | Freq: Three times a day (TID) | ORAL | Status: DC | PRN

## 2012-09-07 MED ORDER — HYDROCODONE-ACETAMINOPHEN 7.5-325 MG PO TABS: 1.0000 | ORAL_TABLET | Freq: Four times a day (QID) | ORAL | Status: DC | PRN

## 2012-09-07 MED ORDER — LOVASTATIN 40 MG PO TABS: 40.0000 mg | ORAL_TABLET | Freq: Every day | ORAL | Status: DC

## 2012-09-07 MED ORDER — TRAMADOL HCL 50 MG PO TABS: 100.0000 mg | ORAL_TABLET | Freq: Three times a day (TID) | ORAL | Status: DC | PRN

## 2012-09-07 MED ORDER — OMEPRAZOLE 20 MG PO CPDR: 20.0000 mg | DELAYED_RELEASE_CAPSULE | Freq: Every day | ORAL | Status: DC

## 2012-09-07 MED ORDER — BENAZEPRIL HCL 40 MG PO TABS: 40.0000 mg | ORAL_TABLET | Freq: Every evening | ORAL | Status: DC

## 2012-09-07 MED ORDER — RANITIDINE HCL 150 MG PO TABS: 150.0000 mg | ORAL_TABLET | Freq: Two times a day (BID) | ORAL | Status: DC

## 2012-09-07 MED ORDER — HYDROCHLOROTHIAZIDE 25 MG PO TABS: 25.0000 mg | ORAL_TABLET | Freq: Every day | ORAL | Status: DC
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
## 2012-09-14 ENCOUNTER — Telehealth: Payer: Self-pay

## 2012-09-14 MED ORDER — LOVASTATIN 20 MG PO TABS: 40.0000 mg | ORAL_TABLET | Freq: Every day | ORAL | Status: DC

## 2012-09-14 NOTE — Telephone Encounter (Signed)
Dr.Shaw Pt states that she recently switched to Lovastatin 40 mg but [facility] just informed her that the only one on their list is the Lovastatin 20 mg twice daily. Best# [phone] [employer]

## 2012-09-14 NOTE — Telephone Encounter (Signed)
Sent in for her at dose requested to save money to you [facility]

## 2012-11-27 ENCOUNTER — Other Ambulatory Visit: Payer: Self-pay | Admitting: Family Medicine

## 2012-11-30 ENCOUNTER — Ambulatory Visit (INDEPENDENT_AMBULATORY_CARE_PROVIDER_SITE_OTHER): Payer: Medicare Other | Admitting: Family Medicine

## 2012-11-30 ENCOUNTER — Encounter: Payer: Self-pay | Admitting: Family Medicine

## 2012-11-30 VITALS — BP 105/67 | HR 89 | Temp 98.3°F | Resp 16 | Ht 66.0 in | Wt 218.0 lb

## 2012-11-30 DIAGNOSIS — I10 Essential (primary) hypertension: Secondary | ICD-10-CM

## 2012-11-30 DIAGNOSIS — M791 Myalgia, unspecified site: Secondary | ICD-10-CM

## 2012-11-30 DIAGNOSIS — K219 Gastro-esophageal reflux disease without esophagitis: Secondary | ICD-10-CM

## 2012-11-30 DIAGNOSIS — M545 Low back pain: Secondary | ICD-10-CM

## 2012-11-30 DIAGNOSIS — E119 Type 2 diabetes mellitus without complications: Secondary | ICD-10-CM

## 2012-11-30 DIAGNOSIS — Z79899 Other long term (current) drug therapy: Secondary | ICD-10-CM

## 2012-11-30 MED ORDER — GLIMEPIRIDE 2 MG PO TABS: ORAL_TABLET | ORAL | Status: DC

## 2012-11-30 MED ORDER — CYCLOBENZAPRINE HCL 10 MG PO TABS: 10.0000 mg | ORAL_TABLET | Freq: Three times a day (TID) | ORAL | Status: DC | PRN

## 2012-11-30 MED ORDER — TRAMADOL HCL 50 MG PO TABS: 100.0000 mg | ORAL_TABLET | Freq: Three times a day (TID) | ORAL | Status: DC | PRN

## 2012-11-30 MED ORDER — METFORMIN HCL 850 MG PO TABS: ORAL_TABLET | ORAL | Status: DC

## 2012-11-30 MED ORDER — HYDROCODONE-ACETAMINOPHEN 7.5-325 MG PO TABS: 1.0000 | ORAL_TABLET | Freq: Four times a day (QID) | ORAL | Status: DC | PRN

## 2012-11-30 MED ORDER — RANITIDINE HCL 150 MG PO TABS: 150.0000 mg | ORAL_TABLET | Freq: Two times a day (BID) | ORAL | Status: DC

## 2012-11-30 NOTE — Patient Instructions (Addendum)
We will do fasting blood work at your next visit. You are doing great - your blood pressure looks good and your weight is stable. Keep up the good work caring for yourself and for everyone else!  Thanks for all you do for our community.  Diabetes Meal Planning Guide The diabetes meal planning guide is a tool to help you plan your meals and snacks. It is important for people with diabetes to manage their blood glucose (sugar) levels. Choosing the right foods and the right amounts throughout your day will help control your blood glucose. Eating right can even help you improve your blood pressure and reach or maintain a healthy weight. CARBOHYDRATE COUNTING MADE EASY When you eat carbohydrates, they turn to sugar. This raises your blood glucose level. Counting carbohydrates can help you control this level so you feel better. When you plan your meals by counting carbohydrates, you can have more flexibility in what you eat and balance your medicine with your food intake. Carbohydrate counting simply means adding up the total amount of carbohydrate grams in your meals and snacks. Try to eat about the same amount at each meal. Foods with carbohydrates are listed below. Each portion below is 1 carbohydrate serving or 15 grams of carbohydrates. Ask your dietician how many grams of carbohydrates you should eat at each meal or snack. Grains and Starches  1 slice bread.   English muffin or hotdog/hamburger bun.   cup cold cereal (unsweetened).   cup cooked pasta or rice.   cup starchy vegetables (corn, potatoes, peas, beans, winter squash).  1 tortilla (6 inches).   bagel.  1 waffle or pancake (size of a CD).   cup cooked cereal.  4 to 6 small crackers. *Whole grain is recommended. Fruit  1 cup fresh unsweetened berries, melon, papaya, pineapple.  1 small fresh fruit.   banana or mango.   cup fruit juice (4 oz unsweetened).   cup canned fruit in natural juice or water.  2 tbs  dried fruit.  12 to 15 grapes or cherries. Milk and Yogurt  1 cup fat-free or 1% milk.  1 cup soy milk.  6 oz light yogurt with sugar-free sweetener.  6 oz low-fat soy yogurt.  6 oz plain yogurt. Vegetables  1 cup raw or  cup cooked is counted as 0 carbohydrates or a "free" food.  If you eat 3 or more servings at 1 meal, count them as 1 carbohydrate serving. Other Carbohydrates   oz chips or pretzels.   cup ice cream or frozen yogurt.   cup sherbet or sorbet.  2 inch square cake, no frosting.  1 tbs honey, sugar, jam, jelly, or syrup.  2 small cookies.  3 squares of graham crackers.  3 cups popcorn.  6 crackers.  1 cup broth-based soup.  Count 1 cup casserole or other mixed foods as 2 carbohydrate servings.  Foods with less than 20 calories in a serving may be counted as 0 carbohydrates or a "free" food. You may want to purchase a book or computer software that lists the carbohydrate gram counts of different foods. In addition, the nutrition facts panel on the labels of the foods you eat are a good source of this information. The label will tell you how big the serving size is and the total number of carbohydrate grams you will be eating per serving. Divide this number by 15 to obtain the number of carbohydrate servings in a portion. Remember, 1 carbohydrate serving equals 15 grams  of carbohydrate. SERVING SIZES Measuring foods and serving sizes helps you make sure you are getting the right amount of food. The list below tells how big or small some common serving sizes are.  1 oz.........4 stacked dice.  3 oz........Marland KitchenDeck of cards.  1 tsp.......Marland KitchenTip of little finger.  1 tbs......Marland KitchenMarland KitchenThumb.  2 tbs.......Marland KitchenGolf ball.   cup......Marland KitchenHalf of a fist.  1 cup.......Marland KitchenA fist. SAMPLE DIABETES MEAL PLAN Below is a sample meal plan that includes foods from the grain and starches, dairy, vegetable, fruit, and meat groups. A dietician can individualize a meal plan to  fit your calorie needs and tell you the number of servings needed from each food group. However, controlling the total amount of carbohydrates in your meal or snack is more important than making sure you include all of the food groups at every meal. You may interchange carbohydrate containing foods (dairy, starches, and fruits). The meal plan below is an example of a 2000 calorie diet using carbohydrate counting. This meal plan has 17 carbohydrate servings. Breakfast  1 cup oatmeal (2 carb servings).   cup light yogurt (1 carb serving).  1 cup blueberries (1 carb serving).   cup almonds. Snack  1 large apple (2 carb servings).  1 low-fat string cheese stick. Lunch  Chicken breast salad.  1 cup spinach.   cup chopped tomatoes.  2 oz chicken breast, sliced.  2 tbs low-fat [place] dressing.  12 whole-wheat crackers (2 carb servings).  12 to 15 grapes (1 carb serving).  1 cup low-fat milk (1 carb serving). Snack  1 cup carrots.   cup hummus (1 carb serving). Dinner  3 oz broiled salmon.  1 cup brown rice (3 carb servings). Snack  1  cups steamed broccoli (1 carb serving) drizzled with 1 tsp olive oil and lemon juice.  1 cup light pudding (2 carb servings). DIABETES MEAL PLANNING WORKSHEET Your dietician can use this worksheet to help you decide how many servings of foods and what types of foods are right for you.  BREAKFAST Food Group and Servings / Carb Servings Grain/Starches __________________________________ Dairy __________________________________________ Vegetable ______________________________________ Fruit ___________________________________________ Meat __________________________________________ Fat ____________________________________________ LUNCH Food Group and Servings / Carb Servings Grain/Starches ___________________________________ Dairy ___________________________________________ Fruit ____________________________________________ Meat  ___________________________________________ Fat _____________________________________________ Laural Golden Food Group and Servings / Carb Servings Grain/Starches ___________________________________ Dairy ___________________________________________ Fruit ____________________________________________ Meat ___________________________________________ Fat _____________________________________________ SNACKS Food Group and Servings / Carb Servings Grain/Starches ___________________________________ Dairy ___________________________________________ Vegetable _______________________________________ Fruit ____________________________________________ Meat ___________________________________________ Fat _____________________________________________ DAILY TOTALS Starches _________________________ Vegetable ________________________ Fruit ____________________________ Dairy ____________________________ Meat ____________________________ Fat ______________________________ Document Released: 01/13/2005 Document Revised: 07/11/2011 Document Reviewed: 11/24/2008 [employer] Patient Information 2014 [employer]

## 2012-12-14 ENCOUNTER — Ambulatory Visit: Payer: BLUE CROSS/BLUE SHIELD | Admitting: Family Medicine

## 2012-12-31 NOTE — Progress Notes (Signed)
Subjective:    Patient ID: Kaitlin Cantrell, female    DOB: 1955-12-15, 57 y.o.   MRN: [id number] Chief Complaint  Patient presents with  . Medication Refill    HPI  Past Medical History  Diagnosis Date  . Allergy     seasonal  . Arthritis   . Diabetes mellitus   . GERD (gastroesophageal reflux disease)   . Hyperlipidemia   . Hypertension   . History of pneumonia     15 years ago   Current Outpatient Prescriptions on File Prior to Visit  Medication Sig Dispense Refill  . aspirin EC 81 MG tablet Take 81 mg by mouth daily.      . benazepril (LOTENSIN) 40 MG tablet Take 1 tablet (40 mg total) by mouth every evening.  90 tablet  3  . docusate sodium 100 MG CAPS Take 100 mg by mouth 2 (two) times daily.  10 capsule  0  . hydrochlorothiazide (HYDRODIURIL) 25 MG tablet Take 1 tablet (25 mg total) by mouth daily.  90 tablet  3  . Krill Oil CAPS Take 1 capsule by mouth 2 (two) times daily.       Marland Kitchen lovastatin (MEVACOR) 20 MG tablet Take 2 tablets (40 mg total) by mouth at bedtime.  180 tablet  3  . omeprazole (PRILOSEC) 20 MG capsule Take 1 capsule (20 mg total) by mouth daily.  1 capsule  0  . Probiotic Product (MISC INTESTINAL FLORA REGULAT) CHEW Chew 1 tablet by mouth daily.       No current facility-administered medications on file prior to visit.   Allergies  Allergen Reactions  . Morphine And Related Other (See Comments)    Very aggressive and doesn't help pain     Review of Systems    BP 105/67  Pulse 89  Temp(Src) 98.3 F (36.8 C)  Resp 16  Ht 5\' 6"  (1.676 m)  Wt 218 lb (98.884 kg)  BMI 35.2 kg/m2 Objective:   Physical Exam        Assessment & Plan:  Type II or unspecified type diabetes mellitus without mention of complication, not stated as uncontrolled  HTN (hypertension)  Encounter for long-term (current) use of other medications  Myalgia - Plan: cyclobenzaprine (FLEXERIL) 10 MG tablet  Lumbago - Plan: HYDROcodone-acetaminophen (NORCO) 7.5-325 MG  per tablet, traMADol (ULTRAM) 50 MG tablet  GERD (gastroesophageal reflux disease) - Plan: ranitidine (ZANTAC) 150 MG tablet  Meds ordered this encounter  Medications  . cyclobenzaprine (FLEXERIL) 10 MG tablet    Sig: Take 1 tablet (10 mg total) by mouth 3 (three) times daily as needed for muscle spasms.    Dispense:  60 tablet    Refill:  2  . glimepiride (AMARYL) 2 MG tablet    Sig: TAKE ONE TABLET BY MOUTH TWICE DAILY    Dispense:  180 tablet    Refill:  2  . HYDROcodone-acetaminophen (NORCO) 7.5-325 MG per tablet    Sig: Take 1 tablet by mouth every 6 (six) hours as needed for pain.    Dispense:  90 tablet    Refill:  2  . metFORMIN (GLUCOPHAGE) 850 MG tablet    Sig: TAKE ONE TABLET BY MOUTH TWICE DAILY WITH MEALS    Dispense:  180 tablet    Refill:  2  . traMADol (ULTRAM) 50 MG tablet    Sig: Take 2 tablets (100 mg total) by mouth every 8 (eight) hours as needed for pain. Maximum dose= 8 tablets per day  Dispense:  240 tablet    Refill:  2  . ranitidine (ZANTAC) 150 MG tablet    Sig: Take 1 tablet (150 mg total) by mouth 2 (two) times daily.    Dispense:  180 tablet    Refill:  2

## 2013-03-08 ENCOUNTER — Encounter: Payer: Self-pay | Admitting: Family Medicine

## 2013-03-08 ENCOUNTER — Ambulatory Visit (INDEPENDENT_AMBULATORY_CARE_PROVIDER_SITE_OTHER): Payer: Medicare Other | Admitting: Family Medicine

## 2013-03-08 ENCOUNTER — Other Ambulatory Visit: Payer: Self-pay | Admitting: Family Medicine

## 2013-03-08 VITALS — BP 128/67 | HR 79 | Temp 97.9°F | Resp 18 | Ht 65.5 in | Wt 223.8 lb

## 2013-03-08 DIAGNOSIS — M545 Low back pain, unspecified: Secondary | ICD-10-CM

## 2013-03-08 DIAGNOSIS — Z79899 Other long term (current) drug therapy: Secondary | ICD-10-CM

## 2013-03-08 DIAGNOSIS — I10 Essential (primary) hypertension: Secondary | ICD-10-CM

## 2013-03-08 DIAGNOSIS — E785 Hyperlipidemia, unspecified: Secondary | ICD-10-CM

## 2013-03-08 DIAGNOSIS — E119 Type 2 diabetes mellitus without complications: Secondary | ICD-10-CM

## 2013-03-08 DIAGNOSIS — Z23 Encounter for immunization: Secondary | ICD-10-CM

## 2013-03-08 LAB — COMPREHENSIVE METABOLIC PANEL
Albumin: 3.6 g/dL (ref 3.5–5.2)
BUN: 20 mg/dL (ref 6–23)
Calcium: 9.4 mg/dL (ref 8.4–10.5)
Chloride: 102 mEq/L (ref 96–112)
Creat: 1.01 mg/dL (ref 0.50–1.10)
Glucose, Bld: 108 mg/dL — ABNORMAL HIGH (ref 70–99)
Potassium: 5.5 mEq/L — ABNORMAL HIGH (ref 3.5–5.3)

## 2013-03-08 LAB — LIPID PANEL
Cholesterol: 165 mg/dL (ref 0–200)
HDL: 38 mg/dL — ABNORMAL LOW (ref >39)
Triglycerides: 118 mg/dL (ref <150)
VLDL: 24 mg/dL (ref 0–40)

## 2013-03-08 LAB — HEMOGLOBIN A1C
Hgb A1c MFr Bld: 6.6 % — ABNORMAL HIGH (ref <5.7)
Mean Plasma Glucose: 143 mg/dL — ABNORMAL HIGH (ref <117)

## 2013-03-08 MED ORDER — HYDROCODONE-ACETAMINOPHEN 7.5-325 MG PO TABS: 1.0000 | ORAL_TABLET | Freq: Four times a day (QID) | ORAL | Status: DC | PRN

## 2013-03-08 MED ORDER — TRAMADOL HCL 50 MG PO TABS: 100.0000 mg | ORAL_TABLET | Freq: Four times a day (QID) | ORAL | Status: DC

## 2013-03-08 MED ORDER — HYDROCODONE-ACETAMINOPHEN 7.5-325 MG PO TABS: 1.0000 | ORAL_TABLET | Freq: Three times a day (TID) | ORAL | Status: DC | PRN

## 2013-03-08 NOTE — Telephone Encounter (Signed)
Pt is calling about to do something with her prescriptions. Call back number is [phone]

## 2013-03-11 ENCOUNTER — Encounter: Payer: Self-pay | Admitting: Family Medicine

## 2013-03-11 NOTE — Progress Notes (Signed)
Subjective:    Patient ID: Kaitlin Cantrell, female    DOB: Aug 02, 1955, 57 y.o.   MRN: [id number] Chief Complaint  Patient presents with  . Medication Refill    HPI  Kaitlin Cantrell is here for her med refills today. She is fasting.  No complaints or concerns - busy taking care of her mother.  Past Medical History  Diagnosis Date  . Allergy     seasonal  . Arthritis   . Diabetes mellitus   . GERD (gastroesophageal reflux disease)   . Hyperlipidemia   . Hypertension   . History of pneumonia     15 years ago   Current Outpatient Prescriptions on File Prior to Visit  Medication Sig Dispense Refill  . aspirin EC 81 MG tablet Take 81 mg by mouth daily.      . benazepril (LOTENSIN) 40 MG tablet Take 1 tablet (40 mg total) by mouth every evening.  90 tablet  3  . glimepiride (AMARYL) 2 MG tablet TAKE ONE TABLET BY MOUTH TWICE DAILY  180 tablet  2  . hydrochlorothiazide (HYDRODIURIL) 25 MG tablet Take 1 tablet (25 mg total) by mouth daily.  90 tablet  3  . Krill Oil CAPS Take 1 capsule by mouth 2 (two) times daily.       Marland Kitchen lovastatin (MEVACOR) 20 MG tablet Take 2 tablets (40 mg total) by mouth at bedtime.  180 tablet  3  . metFORMIN (GLUCOPHAGE) 850 MG tablet TAKE ONE TABLET BY MOUTH TWICE DAILY WITH MEALS  180 tablet  2  . omeprazole (PRILOSEC) 20 MG capsule Take 1 capsule (20 mg total) by mouth daily.  1 capsule  0  . Probiotic Product (MISC INTESTINAL FLORA REGULAT) CHEW Chew 1 tablet by mouth daily.      . ranitidine (ZANTAC) 150 MG tablet Take 1 tablet (150 mg total) by mouth 2 (two) times daily.  180 tablet  2  . docusate sodium 100 MG CAPS Take 100 mg by mouth 2 (two) times daily.  10 capsule  0   No current facility-administered medications on file prior to visit.     Review of Systems  Constitutional: Negative for fever and chills.  Gastrointestinal: Negative for nausea, vomiting, abdominal pain, diarrhea and constipation.  Genitourinary: Negative for urgency, frequency,  decreased urine volume and difficulty urinating.  Musculoskeletal: Positive for arthralgias, back pain and myalgias. Negative for gait problem and joint swelling.  Neurological: Negative for weakness and numbness.  Hematological: Negative for adenopathy. Does not bruise/bleed easily.  Psychiatric/Behavioral: Positive for sleep disturbance.      BP 128/67  Pulse 79  Temp(Src) 97.9 F (36.6 C) (Oral)  Resp 18  Ht 5' 5.5" (1.664 m)  Wt 223 lb 12.8 oz (101.515 kg)  BMI 36.66 kg/m2  SpO2 98% Objective:   Physical Exam  Constitutional: She is oriented to person, place, and time. She appears well-developed and well-nourished. No distress.  HENT:  Head: Normocephalic and atraumatic.  Right Ear: External ear normal.  Left Ear: External ear normal.  Eyes: Conjunctivae are normal. No scleral icterus.  Neck: Normal range of motion. Neck supple. No thyromegaly present.  Cardiovascular: Normal rate, regular rhythm, normal heart sounds and intact distal pulses.   Pulmonary/Chest: Effort normal. No respiratory distress. She has rhonchi in the right lower field and the left lower field.  Musculoskeletal: She exhibits no edema.  Lymphadenopathy:    She has no cervical adenopathy.  Neurological: She is alert and oriented to person, place,  and time.  Skin: Skin is warm and dry. She is not diaphoretic. No erythema.  Psychiatric: She has a normal mood and affect. Her behavior is normal.          Assessment & Plan:   Need for prophylactic vaccination and inoculation against influenza - Plan: Flu Vaccine QUAD 36+ mos IM  Type II or unspecified type diabetes mellitus without mention of complication, not stated as uncontrolled - Plan: Hemoglobin A1c  HTN (hypertension) - Plan: Lipid panel, Comprehensive metabolic panel  Encounter for long-term (current) use of other medications - Plan: Lipid panel, Comprehensive metabolic panel, Hemoglobin A1c  Lumbago - Plan: HYDROcodone-acetaminophen (NORCO)  7.5-325 MG per tablet, traMADol (ULTRAM) 50 MG tablet, DISCONTINUED: traMADol (ULTRAM) 50 MG tablet - given 6 mos of med refills so she can f/u w/ me in May after my maternity leave.  Meds ordered this encounter  Medications  . HYDROcodone-acetaminophen (NORCO) 7.5-325 MG per tablet    Sig: Take 1 tablet by mouth every 8 (eight) hours as needed.    Dispense:  90 tablet    Refill:  0  . HYDROcodone-acetaminophen (NORCO) 7.5-325 MG per tablet    Sig: Take 1 tablet by mouth every 6 (six) hours as needed for moderate pain. May fill on or after 04/06/13.    Dispense:  90 tablet    Refill:  0  . HYDROcodone-acetaminophen (NORCO) 7.5-325 MG per tablet    Sig: Take 1 tablet by mouth every 6 (six) hours as needed for moderate pain. May fill on or after 05/06/13.    Dispense:  90 tablet    Refill:  0  . HYDROcodone-acetaminophen (NORCO) 7.5-325 MG per tablet    Sig: Take 1 tablet by mouth every 6 (six) hours as needed for moderate pain. May fill on or after 06/05/13.    Dispense:  90 tablet    Refill:  0  . HYDROcodone-acetaminophen (NORCO) 7.5-325 MG per tablet    Sig: Take 1 tablet by mouth every 6 (six) hours as needed for moderate pain. May fill on or after 07/02/13.    Dispense:  90 tablet    Refill:  0  . HYDROcodone-acetaminophen (NORCO) 7.5-325 MG per tablet    Sig: Take 1 tablet by mouth every 6 (six) hours as needed for moderate pain. May fill on or after 08/01/13.    Dispense:  90 tablet    Refill:  0  . DISCONTD: traMADol (ULTRAM) 50 MG tablet    Sig: Take 2 tablets (100 mg total) by mouth 4 (four) times daily. Maximum dose= 8 tablets per day    Dispense:  240 tablet    Refill:  2  . traMADol (ULTRAM) 50 MG tablet    Sig: Take 2 tablets (100 mg total) by mouth 4 (four) times daily. Maximum dose= 8 tablets per day. May fill on or after 06/05/13.    Dispense:  240 tablet    Refill:  2    Kaitlin Sorenson, MD MPH

## 2013-04-09 ENCOUNTER — Telehealth: Payer: Self-pay

## 2013-04-09 MED ORDER — CYCLOBENZAPRINE HCL 10 MG PO TABS: ORAL_TABLET | ORAL | Status: DC

## 2013-04-09 NOTE — Telephone Encounter (Signed)
Thanks, oversight, order signed.

## 2013-04-09 NOTE — Telephone Encounter (Signed)
Pended please advise.  

## 2013-04-09 NOTE — Telephone Encounter (Signed)
Pt states her rx for hydrocodone was called in for 6 months, but the rx request for flexeril was not called in.  Please advise pt if we can call in the flexeril as well.

## 2013-04-17 NOTE — Telephone Encounter (Signed)
Error

## 2013-08-27 ENCOUNTER — Ambulatory Visit (INDEPENDENT_AMBULATORY_CARE_PROVIDER_SITE_OTHER): Payer: Medicare Other | Admitting: Family Medicine

## 2013-08-27 VITALS — BP 132/78 | HR 118 | Temp 98.0°F | Resp 16 | Ht 65.0 in | Wt 227.2 lb

## 2013-08-27 DIAGNOSIS — IMO0002 Reserved for concepts with insufficient information to code with codable children: Secondary | ICD-10-CM

## 2013-08-27 DIAGNOSIS — E119 Type 2 diabetes mellitus without complications: Secondary | ICD-10-CM

## 2013-08-27 DIAGNOSIS — I10 Essential (primary) hypertension: Secondary | ICD-10-CM

## 2013-08-27 DIAGNOSIS — M1710 Unilateral primary osteoarthritis, unspecified knee: Secondary | ICD-10-CM

## 2013-08-27 DIAGNOSIS — H60399 Other infective otitis externa, unspecified ear: Secondary | ICD-10-CM

## 2013-08-27 DIAGNOSIS — M545 Low back pain, unspecified: Secondary | ICD-10-CM

## 2013-08-27 DIAGNOSIS — M1712 Unilateral primary osteoarthritis, left knee: Secondary | ICD-10-CM

## 2013-08-27 DIAGNOSIS — K219 Gastro-esophageal reflux disease without esophagitis: Secondary | ICD-10-CM

## 2013-08-27 DIAGNOSIS — Z79899 Other long term (current) drug therapy: Secondary | ICD-10-CM

## 2013-08-27 LAB — COMPREHENSIVE METABOLIC PANEL
ALBUMIN: 4 g/dL (ref 3.5–5.2)
ALT: <8 U/L (ref 0–35)
AST: 13 U/L (ref 0–37)
Alkaline Phosphatase: 91 U/L (ref 39–117)
BUN: 27 mg/dL — ABNORMAL HIGH (ref 6–23)
CALCIUM: 9.5 mg/dL (ref 8.4–10.5)
CHLORIDE: 100 meq/L (ref 96–112)
CO2: 27 mEq/L (ref 19–32)
Creat: 1.59 mg/dL — ABNORMAL HIGH (ref 0.50–1.10)
GLUCOSE: 149 mg/dL — AB (ref 70–99)
POTASSIUM: 5.3 meq/L (ref 3.5–5.3)
Sodium: 136 mEq/L (ref 135–145)
Total Bilirubin: 0.3 mg/dL (ref 0.2–1.2)
Total Protein: 7.3 g/dL (ref 6.0–8.3)

## 2013-08-27 LAB — POCT GLYCOSYLATED HEMOGLOBIN (HGB A1C): Hemoglobin A1C: 6.1

## 2013-08-27 MED ORDER — METFORMIN HCL 850 MG PO TABS: ORAL_TABLET | ORAL | Status: DC

## 2013-08-27 MED ORDER — HYDROCHLOROTHIAZIDE 25 MG PO TABS: 25.0000 mg | ORAL_TABLET | Freq: Every day | ORAL | Status: DC

## 2013-08-27 MED ORDER — OFLOXACIN 0.3 % OT SOLN: 10.0000 [drp] | Freq: Every day | OTIC | Status: DC

## 2013-08-27 MED ORDER — HYDROCODONE-ACETAMINOPHEN 7.5-325 MG PO TABS: 1.0000 | ORAL_TABLET | Freq: Three times a day (TID) | ORAL | Status: DC | PRN

## 2013-08-27 MED ORDER — BENAZEPRIL HCL 40 MG PO TABS: 40.0000 mg | ORAL_TABLET | Freq: Every evening | ORAL | Status: DC

## 2013-08-27 MED ORDER — CYCLOBENZAPRINE HCL 10 MG PO TABS: ORAL_TABLET | ORAL | Status: DC

## 2013-08-27 MED ORDER — RANITIDINE HCL 150 MG PO TABS: 150.0000 mg | ORAL_TABLET | Freq: Two times a day (BID) | ORAL | Status: DC

## 2013-08-27 MED ORDER — GLIMEPIRIDE 2 MG PO TABS: ORAL_TABLET | ORAL | Status: DC

## 2013-08-27 MED ORDER — HYDROCODONE-ACETAMINOPHEN 7.5-325 MG PO TABS: 1.0000 | ORAL_TABLET | Freq: Four times a day (QID) | ORAL | Status: DC | PRN

## 2013-08-27 MED ORDER — LOVASTATIN 20 MG PO TABS: 40.0000 mg | ORAL_TABLET | Freq: Every day | ORAL | Status: DC

## 2013-08-27 MED ORDER — TRAMADOL HCL 50 MG PO TABS: 100.0000 mg | ORAL_TABLET | Freq: Four times a day (QID) | ORAL | Status: DC

## 2013-08-27 NOTE — Progress Notes (Signed)
This chart was scribed for Norberto Sorenson, MD by Beverly Milch, Kaitlin Scribe. This patient was seen in room 9 and the patient's care was started at 8:15 AM.  Subjective:    Patient ID: Kaitlin Cantrell, female    DOB: October 09, 1955, 58 y.o.   MRN: [id number]  Chief Complaint  Patient presents with   Follow-up    medication refill   Otalgia    left   HPI HPI Comments: Kaitlin Cantrell is a 58 y.o. female with a h/o DM who presents to the [facility] complaining of left ear pain. She states it feels swollen, sore, and crusty. She states she has suffered some hearing loss. Pt denies drainage, fevers, chills, rhinorrhea, sore throat. She reports using peroxide without substantial relief. Pt reports seasonal allergies.  She states she fell twice last week. Once while carrying food up a wheelchair ramp, slipping and hitting her left knee, and then the next day, tripping on mulch hitting her right knee and head. She states she is a bit sore but functional. She denies any swelling.   Pt states she hit her left elbow on an escalator at Thanksgiving. She states she feels some fluid build up and may have a chipped bone, but she denies redness and any soreness.   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 She reports her work is fulfilling and that they have expanded. She has moved in with her mother, because she has finally gotten to where she cannot be alone. but she still sees her husband regularly. Pt reports she is still smoking, no change.   Pt would prefer a med refill as well. Pt does not check cbg at home.  Past Medical History  Diagnosis Date   Allergy     seasonal   Arthritis    Diabetes mellitus    GERD (gastroesophageal reflux disease)    Hyperlipidemia    Hypertension    History of pneumonia     15 years ago   Current Outpatient Prescriptions on File Prior to Visit  Medication Sig Dispense Refill   aspirin EC 81 MG tablet Take 81 mg by  mouth daily.       benazepril (LOTENSIN) 40 MG tablet Take 1 tablet (40 mg total) by mouth every evening.  90 tablet  3   cyclobenzaprine (FLEXERIL) 10 MG tablet TAKE ONE TABLET BY MOUTH THREE TIMES DAILY AS NEEDED FOR MUSCLE SPASM  60 tablet  5   glimepiride (AMARYL) 2 MG tablet TAKE ONE TABLET BY MOUTH TWICE DAILY  180 tablet  2   hydrochlorothiazide (HYDRODIURIL) 25 MG tablet Take 1 tablet (25 mg total) by mouth daily.  90 tablet  3   HYDROcodone-acetaminophen (NORCO) 7.5-325 MG per tablet Take 1 tablet by mouth every 8 (eight) hours as needed.  90 tablet  0   Krill Oil CAPS Take 1 capsule by mouth 2 (two) times daily.        lovastatin (MEVACOR) 20 MG tablet Take 2 tablets (40 mg total) by mouth at bedtime.  180 tablet  3   metFORMIN (GLUCOPHAGE) 850 MG tablet TAKE ONE TABLET BY MOUTH TWICE DAILY WITH MEALS  180 tablet  2   omeprazole (PRILOSEC) 20 MG capsule Take 1 capsule (20 mg total) by mouth daily.  1 capsule  0   ranitidine (ZANTAC) 150 MG tablet Take 1 tablet (150 mg total) by mouth 2 (two) times daily.  180 tablet  2   traMADol (ULTRAM) 50 MG tablet Take 2 tablets (100 mg total) by mouth 4 (four) times daily. Maximum dose= 8 tablets per day. May fill on or after 06/05/13.  240 tablet  2   No current facility-administered medications on file prior to visit.   Allergies  Allergen Reactions   Morphine And Related Other (See Comments)    Very aggressive and doesn't help pain      Review of Systems  Constitutional: Negative for fever, chills, activity change and appetite change.  HENT: Positive for ear pain, hearing loss (mild) and sinus pressure. Negative for congestion, ear discharge, rhinorrhea, sneezing and sore throat.   Eyes: Negative for discharge and itching.  Respiratory: Negative for apnea, cough, shortness of breath and wheezing.   Cardiovascular: Negative for chest pain.  Gastrointestinal: Negative for nausea, vomiting, abdominal pain and diarrhea.    Genitourinary: Negative for dysuria and frequency.  Musculoskeletal: Positive for arthralgias (Left elbows and knees bilaterally) and joint swelling (mild left elbow). Negative for back pain and neck pain.  Skin: Negative for rash.  Neurological: Negative for headaches.       Objective:   Physical Exam  Nursing note and vitals reviewed. Constitutional: She is oriented to person, place, and time. She appears well-developed and well-nourished. No distress.  HENT:  Head: Normocephalic and atraumatic.  Right Ear: Tympanic membrane and external ear normal.  Left Ear: Tympanic membrane and external ear normal.  Mouth/Throat: No oropharyngeal exudate.  Left canal edemadous with pustular discharge in between 1 and 3 o'clock. Mucosal edema of the nose.  Eyes: Conjunctivae and EOM are normal. Pupils are equal, round, and reactive to light. Right eye exhibits no discharge. Left eye exhibits no discharge.  Neck: Normal range of motion. Neck supple. No tracheal deviation present. No thyromegaly present.  Cardiovascular: Normal rate, regular rhythm and normal heart sounds.  Exam reveals no gallop and no friction rub.   No murmur heard. Pulmonary/Chest: Effort normal and breath sounds normal. No respiratory distress. She has no wheezes. She has no rales.  Good air movement, clear to auscultation bilaterally  Lymphadenopathy:    She has no cervical adenopathy.  Neurological: She is alert and oriented to person, place, and time. She has normal reflexes. No cranial nerve deficit. Coordination normal.  Skin: Skin is warm and dry. She is not diaphoretic.  Psychiatric: She has a normal mood and affect. Her behavior is normal.     Triage Vitals: BP 132/78   Pulse 118   Temp(Src) 98 F (36.7 C) (Oral)   Resp 16   Ht 5\' 5"  (1.651 m)   Wt 227 lb 3.2 oz (103.057 kg)   BMI 37.81 kg/m2   SpO2 97%  Results for orders placed in visit on 08/27/13  POCT GLYCOSYLATED HEMOGLOBIN (HGB A1C)      Result Value Ref  Range   Hemoglobin A1C 6.1         Assessment & Plan:   DIAGNOSTIC STUDIES: Oxygen Saturation is 97% on RA, normal by my interpretation.    COORDINATION OF CARE: 8:24 AM- Pt advised of plan for treatment and pt agrees. Discussed importance of smoking cessation, managing blood sugar, and pt's home life.   Type II or unspecified type diabetes mellitus without mention of complication, not stated as uncontrolled - Plan: POCT glycosylated hemoglobin (Hb A1C), Comprehensive metabolic panel - well controlled. Lipids at hoal - recheck at f/u  Encounter for long-term (current) use of other medications - Plan: POCT glycosylated  hemoglobin (Hb A1C), Comprehensive metabolic panel   Osteoarthritis of left knee  HTN (hypertension) - Plan: benazepril (LOTENSIN) 40 MG tablet, hydrochlorothiazide (HYDRODIURIL) 25 MG tablet  Otitis, externa, infective - try otic antibiotic gtts.  If worsening, call and will call in oral abx.  GERD (gastroesophageal reflux disease) - Plan: ranitidine (ZANTAC) 150 MG tablet  Lumbago - Plan: traMADol (ULTRAM) 50 MG tablet, HYDROcodone-acetaminophen (NORCO) 7.5-325 MG per tablet - pain meds refilled x 5 mos. F/u in 4 mos for refills.  Meds ordered this encounter  Medications   ofloxacin (FLOXIN) 0.3 % otic solution    Sig: Place 10 drops into the left ear daily.    Dispense:  5 mL    Refill:  0   benazepril (LOTENSIN) 40 MG tablet    Sig: Take 1 tablet (40 mg total) by mouth every evening.    Dispense:  90 tablet    Refill:  3   cyclobenzaprine (FLEXERIL) 10 MG tablet    Sig: TAKE ONE TABLET BY MOUTH THREE TIMES DAILY AS NEEDED FOR MUSCLE SPASM    Dispense:  60 tablet    Refill:  5   glimepiride (AMARYL) 2 MG tablet    Sig: TAKE ONE TABLET BY MOUTH TWICE DAILY    Dispense:  180 tablet    Refill:  2   hydrochlorothiazide (HYDRODIURIL) 25 MG tablet    Sig: Take 1 tablet (25 mg total) by mouth daily.    Dispense:  90 tablet    Refill:  3   lovastatin  (MEVACOR) 20 MG tablet    Sig: Take 2 tablets (40 mg total) by mouth at bedtime.    Dispense:  180 tablet    Refill:  3   metFORMIN (GLUCOPHAGE) 850 MG tablet    Sig: TAKE ONE TABLET BY MOUTH TWICE DAILY WITH MEALS    Dispense:  180 tablet    Refill:  2   ranitidine (ZANTAC) 150 MG tablet    Sig: Take 1 tablet (150 mg total) by mouth 2 (two) times daily.    Dispense:  180 tablet    Refill:  2   traMADol (ULTRAM) 50 MG tablet    Sig: Take 2 tablets (100 mg total) by mouth 4 (four) times daily. Maximum dose= 8 tablets per day. May fill on or after 06/05/13.    Dispense:  240 tablet    Refill:  4   HYDROcodone-acetaminophen (NORCO) 7.5-325 MG per tablet    Sig: Take 1 tablet by mouth every 8 (eight) hours as needed.    Dispense:  90 tablet    Refill:  0   HYDROcodone-acetaminophen (NORCO) 7.5-325 MG per tablet    Sig: Take 1 tablet by mouth every 6 (six) hours as needed for severe pain. May fill on or after 09/25/13    Dispense:  90 tablet    Refill:  0   HYDROcodone-acetaminophen (NORCO) 7.5-325 MG per tablet    Sig: Take 1 tablet by mouth every 6 (six) hours as needed for moderate pain. May fill on or after 10/25/13.    Dispense:  90 tablet    Refill:  0   HYDROcodone-acetaminophen (NORCO) 7.5-325 MG per tablet    Sig: Take 1 tablet by mouth every 6 (six) hours as needed for moderate pain. May fill on or after 11/23/13.    Dispense:  90 tablet    Refill:  0   HYDROcodone-acetaminophen (NORCO) 7.5-325 MG per tablet    Sig: Take 1 tablet by  mouth every 6 (six) hours as needed for moderate pain. May fill on or after 12/23/13.    Dispense:  90 tablet    Refill:  0    I personally performed the services described in this documentation, which was scribed in my presence. The recorded information has been reviewed and considered, and addended by me as needed.  Norberto Sorenson, MD MPH

## 2013-09-03 ENCOUNTER — Encounter: Payer: Self-pay | Admitting: Family Medicine

## 2013-09-06 ENCOUNTER — Ambulatory Visit: Payer: Medicare Other | Admitting: Family Medicine

## 2013-09-10 ENCOUNTER — Ambulatory Visit (INDEPENDENT_AMBULATORY_CARE_PROVIDER_SITE_OTHER): Payer: Medicare Other | Admitting: Family Medicine

## 2013-09-10 VITALS — BP 110/60 | HR 87 | Temp 98.0°F | Ht 65.0 in | Wt 234.2 lb

## 2013-09-10 DIAGNOSIS — N179 Acute kidney failure, unspecified: Secondary | ICD-10-CM

## 2013-09-10 LAB — BASIC METABOLIC PANEL
BUN: 31 mg/dL — ABNORMAL HIGH (ref 6–23)
CALCIUM: 9.3 mg/dL (ref 8.4–10.5)
CHLORIDE: 100 meq/L (ref 96–112)
CO2: 26 meq/L (ref 19–32)
Creat: 1.36 mg/dL — ABNORMAL HIGH (ref 0.50–1.10)
Glucose, Bld: 102 mg/dL — ABNORMAL HIGH (ref 70–99)
Potassium: 5.3 mEq/L (ref 3.5–5.3)
SODIUM: 135 meq/L (ref 135–145)

## 2013-09-10 NOTE — Patient Instructions (Signed)
Olecranon Bursitis  °with Rehab °A bursa is a fluid filled sac that is located between soft tissues (ligaments, tendons, skin) and bones. The purpose of a bursa is to allow the soft tissue to function smoothly, without friction. The olecrenon bursa is located between the back of the elbow (olecrenon) and the skin. Olecrenon bursitis involves inflammation of this bursa, resulting in pain. °SYMPTOMS  °· Pain, tenderness, swelling, warmth, or redness over the back of the elbow. °· Reduced range of motion of the affected elbow. °· Sometimes, severe pain with movement of the affected elbow. °· Crackling sound (crepitation) when the bursa is moved or touched. °· Often, painless swelling of the bursa. °· Fever (when infected). °CAUSES  °Olecranon bursitis is often caused by direct hit (trauma) to the elbow. Less commonly, it is due to overuse and/or strenuous exercise that the elbow is not used to. °RISK INCREASES WITH: °· Sports that require bending or landing on the elbow (football, volleyball). °· Vigorous or repetitive athletic training, or sudden increase or change in activity level (weekend warriors). °· Failure to warm up properly before activity. °· Poor exercise technique. °· Playing on artificial turf. °PREVENTION °· Avoid injuries and the overuse of muscles whenever possible. °· Warm up and stretch properly before activity. °· Allow for adequate recovery between workouts. °· Maintain physical fitness: °· Strength, flexibility, and endurance. °· Cardiovascular fitness. °· Learn and use proper technique. °· Wear properly fitted and padded protective equipment. °PROGNOSIS  °If treated properly, olecranon bursitis is usually curable within 2 weeks.  °RELATED COMPLICATIONS  °· Longer healing time, if not properly treated or if not given enough time to heal. °· Recurring symptoms that result in a chronic problem. °· Joint stiffness with permanent limitation of the affected joint's movement. °· Infection of the  bursa. °· Chronic inflammation or scarring of the bursa. °TREATMENT °Treatment first involves the use of ice and medicine, to reduce pain and inflammation. The use of strengthening and stretching exercises may help reduce pain with activity. These exercises may be performed at home or with a therapist. Elbow pads may be advised, to protect the bursa. If symptoms persist, despite non-surgical treatment, a procedure to withdraw fluid from the bursa may be advised. This procedure may be accompanied with an injection of corticosteroids, to reduce inflammation. Sometimes, surgery is needed to remove the bursa. °MEDICATION °· If pain medicine is needed, nonsteroidal anti-inflammatory medicines (aspirin and ibuprofen), or other minor pain relievers (acetaminophen), are often advised. °· Do not take pain medicine for 7 days before surgery. °· Prescription pain relievers may be given, if your caregiver thinks they are needed. Use only as directed and only as much as you need. °· Corticosteroid injections may be given by your caregiver. These injections should be reserved for the most serious cases, because they may only be given a certain number of times. °HEAT AND COLD °· Cold treatment (icing) should be applied for 10 to 15 minutes every 2 to 3 hours for inflammation and pain, and immediately after activity that aggravates your symptoms. Use ice packs or an ice massage. °· Heat treatment may be used before performing stretching and strengthening activities prescribed by your caregiver, physical therapist, or athletic trainer. Use a heat pack or a warm water soak. °SEEK IMMEDIATE MEDICAL CARE IF:  °· Symptoms get worse or do not improve in 2 weeks, despite treatment. °· Signs of infection develop, including fever of 102° F (38.9° C), increased pain, redness, warmth, or pus draining from   the bursa. °· New, unexplained symptoms develop. (Drugs used in treatment may produce side effects.) °EXERCISES  °RANGE OF MOTION (ROM) AND  STRETCHING EXERCISES - Olecranon Bursitis °These exercises may help you when beginning to rehabilitate your injury. Your symptoms may resolve with or without further involvement from your physician, physical therapist or athletic trainer. While completing these exercises, remember:  °· Restoring tissue flexibility helps normal motion to return to the joints. This allows healthier, less painful movement and activity. °· An effective stretch should be held for at least 30 seconds. °· A stretch should never be painful. You should only feel a gentle lengthening or release in the stretched tissue. °RANGE OF MOTION  Elbow Flexion, Supine °· Lie on your back. Extend your right / left arm into the air, bracing it with your opposite hand. Allow your right / left arm to relax. °· Let your elbow bend, allowing your hand to fall slowly toward your chest. °· You should feel a gentle stretch along the back of your upper arm and elbow. Your physician, physical therapist or athletic trainer may ask you to hold a __________ hand weight to increase the intensity of this stretch. °· Hold for __________ seconds. Slowly return your right / left arm to the upright position. °Repeat __________ times. Complete this exercise __________ times per day. °STRETCH  Elbow Flexors °· Lie on a firm bed or countertop on your back. Be sure that you are in a comfortable position which will allow you to relax your arm muscles. °· Place a folded towel under your right / left upper arm, so that your elbow and shoulder are at the same height. Extend your arm; your elbow should not rest on the bed or towel °· Allow the weight of your hand to straighten your elbow. Keep your arm and chest muscles relaxed. Your caregiver may ask you to increase the intensity of your stretch by adding a small wrist or hand weight. °· Hold for __________ seconds. You should feel a stretch on the inside of your elbow. Slowly return to the starting position. °Repeat __________  times. Complete this exercise __________ times per day. °STRENGTHENING EXERCISES - Olecranon Bursitis °These exercises will help you regain your strength. They may resolve your symptoms with or without further involvement from your physician, physical therapist or athletic trainer. While completing these exercises, remember:  °· Muscles can gain both the endurance and the strength needed for everyday activities through controlled exercises. °· Complete these exercises as instructed by your physician, physical therapist or athletic trainer. Increase the resistance and repetitions only as guided by your caregiver. °· You may experience muscle soreness or fatigue, but the pain or discomfort you are trying to eliminate should never worsen during these exercises. If this pain does worsen, stop and make certain you are following the directions exactly. If the pain is still present after adjustments, discontinue the exercise until you can discuss the trouble with your caregiver. °STRENGTH - Elbow Extensors, Isometric °· Stand or sit upright on a firm surface. Place your right / left arm so that your palm faces your stomach, and it is at the height of your waist. °· Place your opposite hand on the underside of your forearm. Gently push up as your right / left arm resists. Push as hard as you can with both arms without causing any pain or movement at your right / left elbow. Hold this stationary position for __________ seconds. °· Gradually release the tension in both arms. Allow   your muscles to relax completely before repeating. °Repeat __________ times. Complete this exercise __________ times per day. °STRENGTH - Elbow Flexors, Isometric °· Stand or sit upright on a firm surface. Place your right / left arm so that your hand is palm-up and at the height of your waist. °· Place your opposite hand on top of your forearm. Gently push down as your right / left arm resists. Push as hard as you can with both arms without causing  any pain or movement at your right / left elbow. Hold this stationary position for __________ seconds. °· Gradually release the tension in both arms. Allow your muscles to relax completely before repeating. °Repeat __________ times. Complete this exercise __________ times per day. °STRENGTH  Elbow Flexors, Supinated °· With good posture, stand or sit on a firm chair without armrests. Allow your right / left arm to rest at your side with your palm facing forward. °· Holding a __________ weight, or gripping a rubber exercise band or tubing, °· bring your hand toward your shoulder. °· Allow your muscles to control the resistance as your hand returns to your side. °Repeat __________ times. Complete this exercise __________ times per day.  °STRENGTH  Elbow Flexors, Neutral °· With good posture, stand or sit on a firm chair without armrests. Allow your right / left arm to rest at your side with your thumb facing forward. °· Holding a __________weight, or gripping a rubber exercise band or tubing, °· bring your hand toward your shoulder. °· Allow your muscles to control the resistance as your hand returns to your side. °Repeat __________ times. Complete this exercise __________ times per day.  °STRENGTH  Elbow Extensors °· Lie on your back. Extend your right / left elbow into the air, pointing it toward the ceiling. Brace your arm with your opposite hand.* °· Holding a __________ weight in your hand, slowly straighten your right / left elbow. °· Allow your muscles to control the weight as your hand returns to its starting position. °Repeat __________ times. Complete this exercise __________ times per day. °*You may also stand with your elbow overhead and pointed toward the ceiling, supported by your opposite hand. °STRENGTH - Elbow Extensors, Dynamic °· With good posture, stand, or sit on a firm chair without armrests. Keeping your upper arms at your side, bring both hands up to your right / left shoulder while gripping a  rubber exercise band or tubing. Your right / left hand should be just below the other hand. °· Straighten your right / left elbow. Hold for __________ seconds. °· Allow your muscles to control the rubber exercise band, as your hand returns to your shoulder. °Repeat __________ times. Complete this exercise __________ times per day. °Document Released: 04/18/2005 Document Revised: 07/11/2011 Document Reviewed: 07/31/2008 °[employer]® Patient Information ©2014 [employer]. ° °

## 2013-09-11 NOTE — Progress Notes (Signed)
Subjective:    Patient ID: Kaitlin Cantrell, female    DOB: 1955-12-04, 58 y.o.   MRN: [id number] Chief Complaint  Patient presents with  . Follow-up    kidney function    HPI  Pt here with her mother for repeat BMP as on last routine labs Cr had bumped from 1.0 to  1.6 (GFR dec from 63 to 36). Was fasting w/ last labs so hopefully due to dehydration. Has been pushing fluids this wk.  Sev mos ago bumped left elbow - was swollen initially but now resolved, now it feels gritty - like there is a chip in there. No further pain or swelling, not catching or locking.  Past Medical History  Diagnosis Date  . Allergy     seasonal  . Arthritis   . Diabetes mellitus   . GERD (gastroesophageal reflux disease)   . Hyperlipidemia   . Hypertension   . History of pneumonia     15 years ago   Current Outpatient Prescriptions on File Prior to Visit  Medication Sig Dispense Refill  . aspirin EC 81 MG tablet Take 81 mg by mouth daily.      . benazepril (LOTENSIN) 40 MG tablet Take 1 tablet (40 mg total) by mouth every evening.  90 tablet  3  . cyclobenzaprine (FLEXERIL) 10 MG tablet TAKE ONE TABLET BY MOUTH THREE TIMES DAILY AS NEEDED FOR MUSCLE SPASM  60 tablet  5  . glimepiride (AMARYL) 2 MG tablet TAKE ONE TABLET BY MOUTH TWICE DAILY  180 tablet  2  . hydrochlorothiazide (HYDRODIURIL) 25 MG tablet Take 1 tablet (25 mg total) by mouth daily.  90 tablet  3  . HYDROcodone-acetaminophen (NORCO) 7.5-325 MG per tablet Take 1 tablet by mouth every 6 (six) hours as needed for severe pain. May fill on or after 09/25/13  90 tablet  0  . HYDROcodone-acetaminophen (NORCO) 7.5-325 MG per tablet Take 1 tablet by mouth every 6 (six) hours as needed for moderate pain. May fill on or after 12/23/13.  90 tablet  0  . Krill Oil CAPS Take 1 capsule by mouth 2 (two) times daily.       Marland Kitchen lovastatin (MEVACOR) 20 MG tablet Take 2 tablets (40 mg total) by mouth at bedtime.  180 tablet  3  . metFORMIN (GLUCOPHAGE) 850 MG  tablet TAKE ONE TABLET BY MOUTH TWICE DAILY WITH MEALS  180 tablet  2  . ofloxacin (FLOXIN) 0.3 % otic solution Place 10 drops into the left ear daily.  5 mL  0  . omeprazole (PRILOSEC) 20 MG capsule Take 1 capsule (20 mg total) by mouth daily.  1 capsule  0  . ranitidine (ZANTAC) 150 MG tablet Take 1 tablet (150 mg total) by mouth 2 (two) times daily.  180 tablet  2  . traMADol (ULTRAM) 50 MG tablet Take 2 tablets (100 mg total) by mouth 4 (four) times daily. Maximum dose= 8 tablets per day. May fill on or after 06/05/13.  240 tablet  4  . HYDROcodone-acetaminophen (NORCO) 7.5-325 MG per tablet Take 1 tablet by mouth every 8 (eight) hours as needed.  90 tablet  0  . HYDROcodone-acetaminophen (NORCO) 7.5-325 MG per tablet Take 1 tablet by mouth every 6 (six) hours as needed for moderate pain. May fill on or after 10/25/13.  90 tablet  0  . HYDROcodone-acetaminophen (NORCO) 7.5-325 MG per tablet Take 1 tablet by mouth every 6 (six) hours as needed for moderate pain. May  fill on or after 11/23/13.  90 tablet  0   No current facility-administered medications on file prior to visit.   Allergies  Allergen Reactions  . Morphine And Related Other (See Comments)    Very aggressive and doesn't help pain     Review of Systems  Constitutional: Positive for activity change. Negative for fever, chills and unexpected weight change.  Genitourinary: Negative for dysuria, urgency, frequency, hematuria and flank pain.  Musculoskeletal: Positive for arthralgias, back pain, gait problem, joint swelling and myalgias.  Skin: Negative for color change and rash.  Psychiatric/Behavioral: Negative for dysphoric mood.      BP 110/60  Pulse 87  Temp(Src) 98 F (36.7 C) (Oral)  Ht 5\' 5"  (1.651 m)  Wt 234 lb 3.2 oz (106.232 kg)  BMI 38.97 kg/m2  SpO2 93% Objective:   Physical Exam  Constitutional: She is oriented to person, place, and time. She appears well-developed and well-nourished. No distress.  HENT:    Head: Normocephalic and atraumatic.  Right Ear: External ear normal.  Eyes: Conjunctivae are normal. No scleral icterus.  Pulmonary/Chest: Effort normal.  Musculoskeletal:       Right elbow: Normal.      Left elbow: She exhibits normal range of motion, no swelling and no effusion. No tenderness found. No radial head, no medial epicondyle, no lateral epicondyle and no olecranon process tenderness noted.  Olecranon bursa w/ mild effusion and grittiness upon palpation  Neurological: She is alert and oriented to person, place, and time.  Skin: Skin is warm and dry. She is not diaphoretic. No erythema.  Psychiatric: She has a normal mood and affect. Her behavior is normal.   Results for orders placed in visit on 09/10/13  BASIC METABOLIC PANEL      Result Value Ref Range   Sodium 135  135 - 145 mEq/L   Potassium 5.3  3.5 - 5.3 mEq/L   Chloride 100  96 - 112 mEq/L   CO2 26  19 - 32 mEq/L   Glucose, Bld 102 (*) 70 - 99 mg/dL   BUN 31 (*) 6 - 23 mg/dL   Creat 1.191.36 (*) 1.470.50 - 1.10 mg/dL   Calcium 9.3  8.4 - 82.910.5 mg/dL        Assessment & Plan:  Acute renal failure - Plan: Basic metabolic panel - recheck. Pt is on hctz 25mg  and benazepril 40mg . On metformin which was NOT held - pt's DM very well controlled. Will need to stop metformin if renal function not improved. ADDENDUM: improvement in renal fuction to GFR of 44 from 36 but not back to baseline - hold hctz 25 and recheck at lab only visit in 2 wks - future order entered. Will also ask to have CMA recheck BP at this time as well. May need to consider decreasing or stopping benazepril and/or metformin in future. If pt has increased edema - could consider trying benazepril 20-hctz 12.5 combo pill.  Olecranon bursitis after trauma - seems to have resolved spontaneously - avoid further direct trauma or pressure. If recurs, can RTC to consider xray or cortisone inj.   Norberto SorensonEva Felisa Zechman, MD MPH

## 2013-09-24 ENCOUNTER — Other Ambulatory Visit (INDEPENDENT_AMBULATORY_CARE_PROVIDER_SITE_OTHER): Payer: Medicare Other

## 2013-09-24 DIAGNOSIS — I10 Essential (primary) hypertension: Secondary | ICD-10-CM

## 2013-09-24 DIAGNOSIS — N179 Acute kidney failure, unspecified: Secondary | ICD-10-CM

## 2013-09-24 DIAGNOSIS — M1710 Unilateral primary osteoarthritis, unspecified knee: Secondary | ICD-10-CM

## 2013-09-24 DIAGNOSIS — IMO0002 Reserved for concepts with insufficient information to code with codable children: Secondary | ICD-10-CM

## 2013-09-24 DIAGNOSIS — Z79899 Other long term (current) drug therapy: Secondary | ICD-10-CM

## 2013-09-24 DIAGNOSIS — E119 Type 2 diabetes mellitus without complications: Secondary | ICD-10-CM

## 2013-09-24 LAB — BASIC METABOLIC PANEL WITH GFR
BUN: 15 mg/dL (ref 6–23)
CALCIUM: 9.4 mg/dL (ref 8.4–10.5)
CHLORIDE: 101 meq/L (ref 96–112)
CO2: 28 meq/L (ref 19–32)
Creat: 1.09 mg/dL (ref 0.50–1.10)
GFR, Est African American: 66 mL/min
GFR, Est Non African American: 57 mL/min — ABNORMAL LOW
GLUCOSE: 92 mg/dL (ref 70–99)
Potassium: 5.3 mEq/L (ref 3.5–5.3)
Sodium: 137 mEq/L (ref 135–145)

## 2013-09-24 NOTE — Progress Notes (Signed)
Patient here for labs only. 

## 2013-09-25 ENCOUNTER — Encounter: Payer: Self-pay | Admitting: Family Medicine

## 2013-12-24 ENCOUNTER — Ambulatory Visit (INDEPENDENT_AMBULATORY_CARE_PROVIDER_SITE_OTHER): Payer: Medicare Other | Admitting: Family Medicine

## 2013-12-24 VITALS — BP 110/64 | HR 86 | Temp 98.2°F | Resp 18 | Ht 66.0 in | Wt 228.4 lb

## 2013-12-24 DIAGNOSIS — K529 Noninfective gastroenteritis and colitis, unspecified: Secondary | ICD-10-CM

## 2013-12-24 DIAGNOSIS — K5289 Other specified noninfective gastroenteritis and colitis: Secondary | ICD-10-CM

## 2013-12-24 LAB — COMPLETE METABOLIC PANEL WITH GFR
ALT: <8 U/L (ref 0–35)
AST: 10 U/L (ref 0–37)
Albumin: 4 g/dL (ref 3.5–5.2)
Alkaline Phosphatase: 85 U/L (ref 39–117)
BUN: 16 mg/dL (ref 6–23)
CALCIUM: 9.4 mg/dL (ref 8.4–10.5)
CHLORIDE: 103 meq/L (ref 96–112)
CO2: 26 mEq/L (ref 19–32)
Creat: 1.05 mg/dL (ref 0.50–1.10)
GFR, Est African American: 69 mL/min
GFR, Est Non African American: 60 mL/min
Glucose, Bld: 119 mg/dL — ABNORMAL HIGH (ref 70–99)
Potassium: 5.3 mEq/L (ref 3.5–5.3)
Sodium: 138 mEq/L (ref 135–145)
Total Bilirubin: 0.2 mg/dL (ref 0.2–1.2)
Total Protein: 7 g/dL (ref 6.0–8.3)

## 2013-12-24 LAB — POCT CBC
Granulocyte percent: 63.7 %G (ref 37–80)
HCT, POC: 35.9 % — AB (ref 37.7–47.9)
Hemoglobin: 10.8 g/dL — AB (ref 12.2–16.2)
LYMPH, POC: 3.4 (ref 0.6–3.4)
MCH, POC: 23.5 pg — AB (ref 27–31.2)
MCHC: 30 g/dL — AB (ref 31.8–35.4)
MCV: 79.3 fL — AB (ref 80–97)
MID (CBC): 1.3 — AB (ref 0–0.9)
MPV: 6.9 fL (ref 0–99.8)
POC GRANULOCYTE: 8.3 — AB (ref 2–6.9)
POC LYMPH PERCENT: 26.2 %L (ref 10–50)
POC MID %: 10.1 % (ref 0–12)
Platelet Count, POC: 368 10*3/uL (ref 142–424)
RBC: 4.58 M/uL (ref 4.04–5.48)
RDW, POC: 16.5 %
WBC: 13 10*3/uL — AB (ref 4.6–10.2)

## 2013-12-24 LAB — POCT SEDIMENTATION RATE: POCT SED RATE: 10 mm/hr (ref 0–22)

## 2013-12-24 LAB — LIPASE

## 2013-12-24 MED ORDER — BISMUTH SUBSALICYLATE 262 MG PO CHEW: 524.0000 mg | CHEWABLE_TABLET | Freq: Three times a day (TID) | ORAL | Status: DC

## 2013-12-24 MED ORDER — CIPROFLOXACIN HCL 500 MG PO TABS: 500.0000 mg | ORAL_TABLET | Freq: Two times a day (BID) | ORAL | Status: DC

## 2013-12-24 MED ORDER — METRONIDAZOLE 500 MG PO TABS: 500.0000 mg | ORAL_TABLET | Freq: Four times a day (QID) | ORAL | Status: DC

## 2013-12-24 MED ORDER — PROMETHAZINE HCL 25 MG PO TABS: 25.0000 mg | ORAL_TABLET | Freq: Four times a day (QID) | ORAL | Status: DC | PRN

## 2013-12-24 NOTE — Progress Notes (Signed)
Subjective:    Patient ID: Kaitlin Cantrell, female    DOB: Feb 14, 1956, 58 y.o.   MRN: [id number] Chief Complaint  Patient presents with  . Nausea    x 2 1/2 weeks  . Emesis  . Diarrhea  . Abdominal Pain    HPI   Has sulfur burp - rotton eggs or cabbage and that causes nausea/vomiting which happens all night long. Has acid  In her throat in the morning which causes dry heaves in the morning.  Then during the day has diarrhea has large volume of loose stools.  No melena, or hematochezia, no coffee grounds or hematemasis.  No f/c.  During the day, once she is finished with the vomiting and everything is out of her systems, she can drink and eat bland foods - coke, crackers, ramen.  Has been getting very bloated and generalized abdominal cramping.  At night she will get very bloated and it will wake her from sleep, then she will get a large volume of gas and have a very large burp. Sxs get worse after having large fiber - turnip greens, salad.  Feeling weak, headache, ill. A little lightheaded, dizzy worse yesterday.  Exhausted. Has had 4 episodes in the past 2 wks.  Has a hiatal hernia.  Has a lot of acid reflux and increased prilosec to twice a day and zantac.  Has used some tums and rolaids w/o success but no malox or peptobismol.  Her husband had similar symptoms several years ago and had bacterial infection.  Past Medical History  Diagnosis Date  . Allergy     seasonal  . Arthritis   . Diabetes mellitus   . GERD (gastroesophageal reflux disease)   . Hyperlipidemia   . Hypertension   . History of pneumonia     15 years ago   Current Outpatient Prescriptions on File Prior to Visit  Medication Sig Dispense Refill  . aspirin EC 81 MG tablet Take 81 mg by mouth daily.      . benazepril (LOTENSIN) 40 MG tablet Take 1 tablet (40 mg total) by mouth every evening.  90 tablet  3  . cyclobenzaprine (FLEXERIL) 10 MG tablet TAKE ONE TABLET BY MOUTH THREE TIMES DAILY AS NEEDED FOR MUSCLE  SPASM  60 tablet  5  . glimepiride (AMARYL) 2 MG tablet TAKE ONE TABLET BY MOUTH TWICE DAILY  180 tablet  2  . HYDROcodone-acetaminophen (NORCO) 7.5-325 MG per tablet Take 1 tablet by mouth every 6 (six) hours as needed for moderate pain. May fill on or after 12/23/13.  90 tablet  0  . Krill Oil CAPS Take 1 capsule by mouth 2 (two) times daily.       . metFORMIN (GLUCOPHAGE) 850 MG tablet TAKE ONE TABLET BY MOUTH TWICE DAILY WITH MEALS  180 tablet  2  . omeprazole (PRILOSEC) 20 MG capsule Take 1 capsule (20 mg total) by mouth daily.  1 capsule  0  . ranitidine (ZANTAC) 150 MG tablet Take 1 tablet (150 mg total) by mouth 2 (two) times daily.  180 tablet  2  . traMADol (ULTRAM) 50 MG tablet Take 2 tablets (100 mg total) by mouth 4 (four) times daily. Maximum dose= 8 tablets per day. May fill on or after 06/05/13.  240 tablet  4  . HYDROcodone-acetaminophen (NORCO) 7.5-325 MG per tablet Take 1 tablet by mouth every 8 (eight) hours as needed.  90 tablet  0  . HYDROcodone-acetaminophen (NORCO) 7.5-325 MG per tablet Take  1 tablet by mouth every 6 (six) hours as needed for severe pain. May fill on or after 09/25/13  90 tablet  0  . HYDROcodone-acetaminophen (NORCO) 7.5-325 MG per tablet Take 1 tablet by mouth every 6 (six) hours as needed for moderate pain. May fill on or after 10/25/13.  90 tablet  0  . HYDROcodone-acetaminophen (NORCO) 7.5-325 MG per tablet Take 1 tablet by mouth every 6 (six) hours as needed for moderate pain. May fill on or after 11/23/13.  90 tablet  0   No current facility-administered medications on file prior to visit.   Allergies  Allergen Reactions  . Morphine And Related Other (See Comments)    Very aggressive and doesn't help pain     Review of Systems  Constitutional: Positive for diaphoresis, activity change, appetite change and fatigue. Negative for fever, chills and unexpected weight change.  Respiratory: Negative for shortness of breath.   Cardiovascular: Negative for  chest pain and leg swelling.  Gastrointestinal: Positive for nausea, vomiting, abdominal pain, diarrhea and abdominal distention. Negative for constipation, blood in stool and anal bleeding.  Genitourinary: Negative for dysuria, decreased urine volume and difficulty urinating.  Musculoskeletal: Positive for arthralgias, back pain and myalgias. Negative for gait problem.  Skin: Negative for rash.  Neurological: Positive for dizziness, weakness, light-headedness and headaches. Negative for tremors and syncope.  Hematological: Negative for adenopathy.  Psychiatric/Behavioral: Positive for sleep disturbance.       Objective:  BP 110/64  Pulse 86  Temp(Src) 98.2 F (36.8 C) (Oral)  Resp 18  Ht 5\' 6"  (1.676 m)  Wt 228 lb 6.4 oz (103.602 kg)  BMI 36.88 kg/m2  SpO2 93%  Physical Exam  Constitutional: She is oriented to person, place, and time. She appears well-developed and well-nourished. She appears ill. No distress.  HENT:  Head: Normocephalic and atraumatic.  Mouth/Throat: Uvula is midline. Mucous membranes are not pale, dry and not cyanotic. She has dentures. Posterior oropharyngeal erythema present. No oropharyngeal exudate or posterior oropharyngeal edema.  Neck: Normal range of motion. Neck supple. No thyromegaly present.  Cardiovascular: Normal rate, regular rhythm, normal heart sounds and intact distal pulses.   Pulmonary/Chest: Effort normal and breath sounds normal. No respiratory distress.  Abdominal: Soft. Bowel sounds are normal. There is tenderness. There is no rebound, no guarding and no CVA tenderness.  Musculoskeletal: She exhibits no edema.  Lymphadenopathy:    She has no cervical adenopathy.  Neurological: She is alert and oriented to person, place, and time.  Skin: Skin is warm and dry. She is not diaphoretic. No erythema.  Psychiatric: She has a normal mood and affect. Her behavior is normal.          Assessment & Plan:   Gastroenteritis/enteritis - Plan:  POCT CBC, POCT SEDIMENTATION RATE, COMPLETE METABOLIC PANEL WITH GFR, Lipase, IFOBT POC (occult bld, rslt in office), Fecal lactoferrin, Stool culture Hopefully sxs will improve but if worsen and diarrhea recurs, then collect stool samples.  Will start empiric antibiotics due to elev WBC and persistence/recurrence of sxs.  If sxs persist - recheck in clinic for IVF and consider imaging. Meds ordered this encounter  Medications  . ciprofloxacin (CIPRO) 500 MG tablet    Sig: Take 1 tablet (500 mg total) by mouth 2 (two) times daily.    Dispense:  20 tablet    Refill:  0  . metroNIDAZOLE (FLAGYL) 500 MG tablet    Sig: Take 1 tablet (500 mg total) by mouth 4 (four) times daily.  DO NOT CONSUME ALCOHOL WHILE TAKING THIS MEDICATION.    Dispense:  40 tablet    Refill:  0  . promethazine (PHENERGAN) 25 MG tablet    Sig: Take 1 tablet (25 mg total) by mouth every 6 (six) hours as needed for nausea or vomiting.    Dispense:  40 tablet    Refill:  0  . bismuth subsalicylate (PEPTO BISMOL) 262 MG chewable tablet    Sig: Chew 2 tablets (524 mg total) by mouth 4 (four) times daily -  before meals and at bedtime. For gastritis, reflux, and loose stools    Dispense:  60 tablet    Refill:  1   Norberto Sorenson, MD MPH   Results for orders placed in visit on 12/24/13  COMPLETE METABOLIC PANEL WITH GFR      Result Value Ref Range   Sodium 138  135 - 145 mEq/L   Potassium 5.3  3.5 - 5.3 mEq/L   Chloride 103  96 - 112 mEq/L   CO2 26  19 - 32 mEq/L   Glucose, Bld 119 (*) 70 - 99 mg/dL   BUN 16  6 - 23 mg/dL   Creat 1.61  0.96 - 0.45 mg/dL   Total Bilirubin 0.2  0.2 - 1.2 mg/dL   Alkaline Phosphatase 85  39 - 117 U/L   AST 10  0 - 37 U/L   ALT <8  0 - 35 U/L   Total Protein 7.0  6.0 - 8.3 g/dL   Albumin 4.0  3.5 - 5.2 g/dL   Calcium 9.4  8.4 - 40.9 mg/dL   GFR, Est African American 69     GFR, Est Non African American 60    LIPASE      Result Value Ref Range   Lipase <10  0 - 75 U/L  POCT CBC       Result Value Ref Range   WBC 13.0 (*) 4.6 - 10.2 K/uL   Lymph, poc 3.4  0.6 - 3.4   POC LYMPH PERCENT 26.2  10 - 50 %L   MID (cbc) 1.3 (*) 0 - 0.9   POC MID % 10.1  0 - 12 %M   POC Granulocyte 8.3 (*) 2 - 6.9   Granulocyte percent 63.7  37 - 80 %G   RBC 4.58  4.04 - 5.48 M/uL   Hemoglobin 10.8 (*) 12.2 - 16.2 g/dL   HCT, POC 81.1 (*) 91.4 - 47.9 %   MCV 79.3 (*) 80 - 97 fL   MCH, POC 23.5 (*) 27 - 31.2 pg   MCHC 30.0 (*) 31.8 - 35.4 g/dL   RDW, POC 78.2     Platelet Count, POC 368  142 - 424 K/uL   MPV 6.9  0 - 99.8 fL  POCT SEDIMENTATION RATE      Result Value Ref Range   POCT SED RATE 10  0 - 22 mm/hr

## 2013-12-24 NOTE — Patient Instructions (Signed)

## 2013-12-26 ENCOUNTER — Encounter: Payer: Self-pay | Admitting: Family Medicine

## 2013-12-26 LAB — IFOBT (OCCULT BLOOD): IMMUNOLOGICAL FECAL OCCULT BLOOD TEST: POSITIVE

## 2013-12-27 LAB — FECAL LACTOFERRIN, QUANT: Lactoferrin: POSITIVE

## 2013-12-28 ENCOUNTER — Ambulatory Visit (INDEPENDENT_AMBULATORY_CARE_PROVIDER_SITE_OTHER): Payer: Medicare Other | Admitting: Family Medicine

## 2013-12-28 VITALS — BP 138/68 | HR 109 | Temp 98.3°F | Resp 20 | Ht 65.75 in | Wt 231.0 lb

## 2013-12-28 DIAGNOSIS — R1031 Right lower quadrant pain: Secondary | ICD-10-CM

## 2013-12-28 DIAGNOSIS — D72829 Elevated white blood cell count, unspecified: Secondary | ICD-10-CM

## 2013-12-28 DIAGNOSIS — Z8719 Personal history of other diseases of the digestive system: Secondary | ICD-10-CM

## 2013-12-28 DIAGNOSIS — R197 Diarrhea, unspecified: Secondary | ICD-10-CM

## 2013-12-28 DIAGNOSIS — R1032 Left lower quadrant pain: Secondary | ICD-10-CM

## 2013-12-28 LAB — POCT CBC
GRANULOCYTE PERCENT: 65.5 % (ref 37–80)
HCT, POC: 37.1 % — AB (ref 37.7–47.9)
Hemoglobin: 11.1 g/dL — AB (ref 12.2–16.2)
Lymph, poc: 2.5 (ref 0.6–3.4)
MCH, POC: 23.7 pg — AB (ref 27–31.2)
MCHC: 29.9 g/dL — AB (ref 31.8–35.4)
MCV: 79.3 fL — AB (ref 80–97)
MID (CBC): 0.7 (ref 0–0.9)
MPV: 5.8 fL (ref 0–99.8)
POC GRANULOCYTE: 6.1 (ref 2–6.9)
POC LYMPH PERCENT: 27.3 %L (ref 10–50)
POC MID %: 7.2 %M (ref 0–12)
Platelet Count, POC: 360 10*3/uL (ref 142–424)
RBC: 4.68 M/uL (ref 4.04–5.48)
RDW, POC: 16.9 %
WBC: 9.3 10*3/uL (ref 4.6–10.2)

## 2013-12-28 NOTE — Progress Notes (Addendum)
Subjective:  This chart was scribed for Kaitlin Staggers, MD by Arlan Organ, [facility] Scribe. This patient was seen in room 3 and the patient's care was started 1:15 PM.    Patient ID: Kaitlin Cantrell, female    DOB: [birth year], 58 y.o.   MRN: [id number]  Chief Complaint  Patient presents with  . Follow-up    per Dr. Clelia Croft    HPI  HPI Comments: Kaitlin Cantrell is a 58 y.o. female who presents to [facility] here for follow up per Dr. Clelia Croft today. Last seen 4 days ago by Dr. Clelia Croft with nausea, vomiting an diarrhea. She has a history of GERD. Was diagnosed with gastroenteritis but had WBC of 13 at last office visit. Normal lipase and sed rate and CMP 4 days ago other than slightly elevated sugar. Stated of Cipro 500 mg BID and Flagyl 500 mg QDS. Stool culture fecal latcoferrin was positive for blood in stool in office. Based on positive result reccommended to return to clinic for evaluation. Pt states she is better. Vomiting only occuring about once daily. Reports belching consisting of a sulfa taste in her mouth prior to episodes of vomiting. She also admits to some chills and flush feeling without any true fever since last visit. Abdominal cramping still ongoing but mild since onset of sympoms. No diarrhea but reports about 1-2 loose stools daily. She denies any trouble urinating but reports darkened urine onset few days. Colonoscopy performed in November 2012-inflammation noted in terminal ilium and R colon otherwise normal exam. Biopsy performed at that time benign small bowel mucosa with minimal to moderal active chronic colitis consistent with IBD. 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  Patient Active Problem List   Diagnosis Date Noted  . Type II or unspecified type diabetes mellitus without mention of complication, not stated as uncontrolled 03/30/2012  . Encounter for long-term (current) use of other medications  03/30/2012  . Osteoarthritis of left knee 02/17/2012    Class: Diagnosis of  . HTN (hypertension) 05/10/2011   Past Medical History  Diagnosis Date  . Allergy     seasonal  . Arthritis   . Diabetes mellitus   . GERD (gastroesophageal reflux disease)   . Hyperlipidemia   . Hypertension   . History of pneumonia     15 years ago   Past Surgical History  Procedure Laterality Date  . Ankle fracture surgery  1974    left and pinned then pins removed  . Ankle ganglion cyst excision  1994    right  . Back surgery  1998    discectomy and then fusion  . Umbilical hernia repair  1996  . Tubal ligation  1996  . Tonsillectomy  1972  . Knee arthroplasty  02/14/2012    Procedure: COMPUTER ASSISTED TOTAL KNEE ARTHROPLASTY;  Surgeon: Cammy Copa, MD;  Location: [facility] OR;  Service: Orthopedics;  Laterality: Left;  Left total knee arthroplasty   Allergies  Allergen Reactions  . Morphine And Related Other (See Comments)    Very aggressive and doesn't help pain   Prior to Admission medications   Medication Sig Start Date End Date Taking? Authorizing Provider  aspirin EC 81 MG tablet Take 81 mg by mouth daily.   Yes Historical Provider, MD  benazepril (LOTENSIN) 40 MG tablet Take 1 tablet (40 mg total) by mouth every evening. 08/27/13  Yes Sherren Mocha, MD  bismuth subsalicylate (PEPTO  BISMOL) 262 MG chewable tablet Chew 2 tablets (524 mg total) by mouth 4 (four) times daily -  before meals and at bedtime. For gastritis, reflux, and loose stools 12/24/13  Yes Sherren Mocha, MD  ciprofloxacin (CIPRO) 500 MG tablet Take 1 tablet (500 mg total) by mouth 2 (two) times daily. 12/24/13  Yes Sherren Mocha, MD  cyclobenzaprine (FLEXERIL) 10 MG tablet TAKE ONE TABLET BY MOUTH THREE TIMES DAILY AS NEEDED FOR MUSCLE SPASM 08/27/13  Yes Sherren Mocha, MD  glimepiride (AMARYL) 2 MG tablet TAKE ONE TABLET BY MOUTH TWICE DAILY 08/27/13  Yes Sherren Mocha, MD  HYDROcodone-acetaminophen (NORCO) 7.5-325 MG per tablet Take 1  tablet by mouth every 8 (eight) hours as needed. 08/27/13  Yes Sherren Mocha, MD  HYDROcodone-acetaminophen (NORCO) 7.5-325 MG per tablet Take 1 tablet by mouth every 6 (six) hours as needed for severe pain. May fill on or after 09/25/13 08/27/13  Yes Sherren Mocha, MD  HYDROcodone-acetaminophen (NORCO) 7.5-325 MG per tablet Take 1 tablet by mouth every 6 (six) hours as needed for moderate pain. May fill on or after 10/25/13. 08/27/13  Yes Sherren Mocha, MD  HYDROcodone-acetaminophen (NORCO) 7.5-325 MG per tablet Take 1 tablet by mouth every 6 (six) hours as needed for moderate pain. May fill on or after 11/23/13. 08/27/13  Yes Sherren Mocha, MD  HYDROcodone-acetaminophen (NORCO) 7.5-325 MG per tablet Take 1 tablet by mouth every 6 (six) hours as needed for moderate pain. May fill on or after 12/23/13. 08/27/13  Yes Sherren Mocha, MD  [employer] CAPS Take 1 capsule by mouth 2 (two) times daily.    Yes Historical Provider, MD  metFORMIN (GLUCOPHAGE) 850 MG tablet TAKE ONE TABLET BY MOUTH TWICE DAILY WITH MEALS 08/27/13  Yes Sherren Mocha, MD  metroNIDAZOLE (FLAGYL) 500 MG tablet Take 1 tablet (500 mg total) by mouth 4 (four) times daily. DO NOT CONSUME ALCOHOL WHILE TAKING THIS MEDICATION. 12/24/13  Yes Sherren Mocha, MD  omeprazole (PRILOSEC) 20 MG capsule Take 1 capsule (20 mg total) by mouth daily. 09/07/12  Yes Sherren Mocha, MD  promethazine (PHENERGAN) 25 MG tablet Take 1 tablet (25 mg total) by mouth every 6 (six) hours as needed for nausea or vomiting. 12/24/13  Yes Sherren Mocha, MD  ranitidine (ZANTAC) 150 MG tablet Take 1 tablet (150 mg total) by mouth 2 (two) times daily. 08/27/13  Yes Sherren Mocha, MD  traMADol (ULTRAM) 50 MG tablet Take 2 tablets (100 mg total) by mouth 4 (four) times daily. Maximum dose= 8 tablets per day. May fill on or after 06/05/13. 08/27/13  Yes Sherren Mocha, MD   History   Social History  . Marital Status: Married    Spouse Name: N/A    Number of Children: 0  . Years of Education: N/A   Occupational History    . retired    Social History Main Topics  . Smoking status: Current Every Day Smoker -- 1.00 packs/day for 40 years    Types: Cigarettes  . Smokeless tobacco: Never Used  . Alcohol Use: No  . Drug Use: No  . Sexual Activity: Not on file   Other Topics Concern  . Not on file   Social History Narrative   Exercise walking daily (varies)     Review of Systems  Constitutional: Positive for chills. Negative for fever.  Gastrointestinal: Positive for nausea, vomiting and abdominal pain. Negative for diarrhea.  Objective:  Physical Exam  Vitals reviewed. Constitutional: She is oriented to person, place, and time. She appears well-developed and well-nourished. No distress.  HENT:  Head: Normocephalic and atraumatic.  Cardiovascular: Normal rate, regular rhythm and normal heart sounds.  Exam reveals no gallop and no friction rub.   No murmur heard. Pulmonary/Chest: Effort normal and breath sounds normal.  Abdominal: There is tenderness. There is no rebound and no guarding.  Hyperactive bowel sounds noted No CVA tenderness Diffuse tenderness along lower abdomen R greater than L  Neurological: She is alert and oriented to person, place, and time.  Skin: Skin is warm and dry. No rash noted.  Psychiatric: She has a normal mood and affect. Her behavior is normal.    Filed Vitals:   12/28/13 1254  BP: 138/68  Pulse: 109  Temp: 98.3 F (36.8 C)  TempSrc: Oral  Resp: 20  Height: 5' 5.75" (1.67 m)  Weight: 231 lb (104.781 kg)  SpO2: 95%     Results for orders placed in visit on 12/28/13  POCT CBC      Result Value Ref Range   WBC 9.3  4.6 - 10.2 K/uL   Lymph, poc 2.5  0.6 - 3.4   POC LYMPH PERCENT 27.3  10 - 50 %L   MID (cbc) 0.7  0 - 0.9   POC MID % 7.2  0 - 12 %M   POC Granulocyte 6.1  2 - 6.9   Granulocyte percent 65.5  37 - 80 %G   RBC 4.68  4.04 - 5.48 M/uL   Hemoglobin 11.1 (*) 12.2 - 16.2 g/dL   HCT, POC 30.8 (*) 65.7 - 47.9 %   MCV 79.3 (*) 80 - 97 fL    MCH, POC 23.7 (*) 27 - 31.2 pg   MCHC 29.9 (*) 31.8 - 35.4 g/dL   RDW, POC 84.6     Platelet Count, POC 360  142 - 424 K/uL   MPV 5.8  0 - 99.8 fL    Assessment & Plan:  DEBBI STRANDBERG is a 58 y.o. female Elevated WBC count - Plan: POCT CBC, Ambulatory referral to Gastroenterology  Abdominal pain, RLQ - Plan: POCT CBC, Ambulatory referral to Gastroenterology  Abdominal pain, LLQ - Plan: POCT CBC, Ambulatory referral to Gastroenterology  Diarrhea - Plan: POCT CBC, Ambulatory referral to Gastroenterology  History of colitis - Plan: POCT CBC, Ambulatory referral to Gastroenterology  Improved diarrhea, now just soft stools, afebrile and resolved leukocytosis. Possible recurrence of colitis, but improved on cipro, flagyl. Cont same doses and follow up with Dr. Clelia Croft in 2 days.  Plan on GI follow up this week as well if possible. rtc/ER precautions.   No orders of the defined types were placed in this encounter.   Patient Instructions  Your infection fighting cells have improved, and anemia/blood count stable. As you are improving - continue same dose of Cipro and Flagyl for now, and we will try to schedule you for follow up with gastroenterology this week.  Continue to drink plenty of fluids. Follow up with Dr. Clelia Croft Monday night after 5pm.  If any fever, increase in abdominal pain, or worsening otherwise - return for recheck sooner.  I personally performed the services described in this documentation, which was scribed in my presence. The recorded information has been reviewed and considered, and addended by me as needed.

## 2013-12-28 NOTE — Patient Instructions (Addendum)
Your infection fighting cells have improved, and anemia/blood count stable. As you are improving - continue same dose of Cipro and Flagyl for now, and we will try to schedule you for follow up with gastroenterology this week.  Continue to drink plenty of fluids. Follow up with Dr. Clelia Croft Monday night after 5pm.  If any fever, increase in abdominal pain, or worsening otherwise - return for recheck sooner.

## 2013-12-30 LAB — STOOL CULTURE

## 2014-01-24 ENCOUNTER — Other Ambulatory Visit: Payer: Self-pay | Admitting: Family Medicine

## 2014-01-24 ENCOUNTER — Telehealth: Payer: Self-pay

## 2014-01-24 DIAGNOSIS — M545 Low back pain: Secondary | ICD-10-CM

## 2014-01-24 MED ORDER — HYDROCODONE-ACETAMINOPHEN 7.5-325 MG PO TABS: 1.0000 | ORAL_TABLET | Freq: Four times a day (QID) | ORAL | Status: DC | PRN

## 2014-01-24 MED ORDER — TRAMADOL HCL 50 MG PO TABS: 100.0000 mg | ORAL_TABLET | Freq: Four times a day (QID) | ORAL | Status: DC

## 2014-01-24 NOTE — Telephone Encounter (Signed)
Med refills printed and ready for pt pick-up - will have someone bring them over to [facility] this afternoon.  4 mos refills so have pt sched f/u OV in 3-4 mos for additional refills.

## 2014-01-24 NOTE — Telephone Encounter (Signed)
Pt of Dr. Clelia Croft would like refills on HYDROcodone-acetaminophen [facility]) 7.5-325 MG per tablet [[id number]], and traMADol (ULTRAM) 50 MG tablet [[id number]]   [phone]

## 2014-01-24 NOTE — Telephone Encounter (Signed)
LM on cell phone Rx will be ready to pick up tomorrow.

## 2014-01-28 ENCOUNTER — Telehealth: Payer: Self-pay

## 2014-01-28 NOTE — Telephone Encounter (Signed)
ERROR

## 2014-02-03 ENCOUNTER — Telehealth: Payer: Self-pay | Admitting: *Deleted

## 2014-02-03 NOTE — Telephone Encounter (Signed)
Left message for patient to call to make an appointment to follow up diabetes.

## 2014-02-04 ENCOUNTER — Encounter: Payer: Self-pay | Admitting: Family Medicine

## 2014-02-19 ENCOUNTER — Other Ambulatory Visit: Payer: Self-pay | Admitting: Physician Assistant

## 2014-03-24 ENCOUNTER — Other Ambulatory Visit: Payer: Self-pay | Admitting: Physician Assistant

## 2014-04-18 ENCOUNTER — Ambulatory Visit (INDEPENDENT_AMBULATORY_CARE_PROVIDER_SITE_OTHER): Payer: Medicare Other | Admitting: Family Medicine

## 2014-04-18 ENCOUNTER — Encounter: Payer: Self-pay | Admitting: Family Medicine

## 2014-04-18 VITALS — BP 168/84 | HR 95 | Temp 98.3°F | Resp 16 | Ht 65.5 in | Wt 227.0 lb

## 2014-04-18 DIAGNOSIS — E119 Type 2 diabetes mellitus without complications: Secondary | ICD-10-CM

## 2014-04-18 DIAGNOSIS — K219 Gastro-esophageal reflux disease without esophagitis: Secondary | ICD-10-CM

## 2014-04-18 DIAGNOSIS — Z23 Encounter for immunization: Secondary | ICD-10-CM

## 2014-04-18 DIAGNOSIS — N179 Acute kidney failure, unspecified: Secondary | ICD-10-CM

## 2014-04-18 DIAGNOSIS — M545 Low back pain: Secondary | ICD-10-CM

## 2014-04-18 LAB — GLUCOSE, POCT (MANUAL RESULT ENTRY): POC Glucose: 127 mg/dl — AB (ref 70–99)

## 2014-04-18 LAB — POCT GLYCOSYLATED HEMOGLOBIN (HGB A1C): HEMOGLOBIN A1C: 6.7

## 2014-04-18 MED ORDER — HYDROCODONE-ACETAMINOPHEN 7.5-325 MG PO TABS: 1.0000 | ORAL_TABLET | Freq: Four times a day (QID) | ORAL | Status: DC | PRN

## 2014-04-18 MED ORDER — TRAMADOL HCL 50 MG PO TABS: 100.0000 mg | ORAL_TABLET | Freq: Four times a day (QID) | ORAL | Status: DC
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
## 2014-04-19 LAB — COMPREHENSIVE METABOLIC PANEL
AST: 13 U/L (ref 0–37)
Albumin: 4 g/dL (ref 3.5–5.2)
Alkaline Phosphatase: 97 U/L (ref 39–117)
BILIRUBIN TOTAL: 0.2 mg/dL (ref 0.2–1.2)
BUN: 18 mg/dL (ref 6–23)
CHLORIDE: 102 meq/L (ref 96–112)
CO2: 24 meq/L (ref 19–32)
Calcium: 9.9 mg/dL (ref 8.4–10.5)
Creat: 1.31 mg/dL — ABNORMAL HIGH (ref 0.50–1.10)
Glucose, Bld: 111 mg/dL — ABNORMAL HIGH (ref 70–99)
Potassium: 5.4 mEq/L — ABNORMAL HIGH (ref 3.5–5.3)
SODIUM: 140 meq/L (ref 135–145)
TOTAL PROTEIN: 7.2 g/dL (ref 6.0–8.3)

## 2014-04-20 ENCOUNTER — Telehealth: Payer: Self-pay

## 2014-04-20 LAB — MICROALBUMIN, URINE: Microalb, Ur: 0.4 mg/dL (ref <2.0)

## 2014-04-20 NOTE — Telephone Encounter (Signed)
Patient needs a refill of flexeril

## 2014-04-21 ENCOUNTER — Other Ambulatory Visit: Payer: Self-pay | Admitting: Family Medicine

## 2014-04-21 ENCOUNTER — Encounter: Payer: Self-pay | Admitting: Family Medicine

## 2014-04-21 NOTE — Telephone Encounter (Signed)
Pt was here on 12/18 for medication refill- is this one medication ok to send in refills?

## 2014-04-21 NOTE — Telephone Encounter (Signed)
Meds ordered this encounter  Medications  . cyclobenzaprine (FLEXERIL) 10 MG tablet    Sig: TAKE ONE TABLET BY MOUTH THREE TIMES DAILY AS NEEDED-NEEDS TO BE SEEN    Dispense:  30 tablet    Refill:  0

## 2014-04-21 NOTE — Telephone Encounter (Signed)
Yes, sorry I forgot at her visit  - looks like Chelle sent in for pt yesterday.    Hgba1c is still at goal at 6.7 but a little worse than prior (6.1) so cont to work on low [employer].  Kidney function is a little irritated again (though not as much as prior). Decreased from 59% (which is normal) to 45%.  Recommend recheck in 1 mo - future orders placed so pt can drop by 102 or 104 for lab-only visit - make sure to tell front desk that you don't need to see a provider so that you are "fast-tracked". Cont to drink plenty of water and try not to use any otc pain medications.

## 2014-04-22 ENCOUNTER — Other Ambulatory Visit: Payer: Self-pay | Admitting: Family Medicine

## 2014-04-22 MED ORDER — METFORMIN HCL 850 MG PO TABS: ORAL_TABLET | ORAL | Status: DC

## 2014-04-22 MED ORDER — RANITIDINE HCL 150 MG PO TABS: 150.0000 mg | ORAL_TABLET | Freq: Two times a day (BID) | ORAL | Status: DC

## 2014-04-22 MED ORDER — CYCLOBENZAPRINE HCL 10 MG PO TABS: 10.0000 mg | ORAL_TABLET | Freq: Three times a day (TID) | ORAL | Status: DC | PRN

## 2014-04-22 MED ORDER — GLIMEPIRIDE 2 MG PO TABS: ORAL_TABLET | ORAL | Status: DC

## 2014-04-22 NOTE — Telephone Encounter (Signed)
LM for pt to RTC in one month for lab only visit.  Advised refill was sent to the pharmacy.

## 2014-05-07 ENCOUNTER — Telehealth: Payer: Self-pay | Admitting: Family Medicine

## 2014-05-07 NOTE — Telephone Encounter (Signed)
Refill on Flexeril and needs a handicap placard completed.  [phone]

## 2014-05-09 MED ORDER — CYCLOBENZAPRINE HCL 10 MG PO TABS: 10.0000 mg | ORAL_TABLET | Freq: Three times a day (TID) | ORAL | Status: DC | PRN

## 2014-05-09 NOTE — Telephone Encounter (Signed)
Flexeril sent to pharm. So sorry I forgot to give this to her at her last visit. If you could write her name on one and put it in my box I will be happy to sign it when I come in on Monday 1/11 - would she like [place] to mail it to her or call her to come pick it up?

## 2014-05-10 NOTE — Telephone Encounter (Signed)
Pt notified about rx. Pt is needing handicap placard for her mom, who already has one, but she needed another for a different vehicle. Advised that she could go to the [facility] and purchase another one under her mom's name

## 2014-08-01 ENCOUNTER — Ambulatory Visit: Payer: Medicare Other | Admitting: Family Medicine

## 2014-08-08 ENCOUNTER — Telehealth: Payer: Self-pay

## 2014-08-08 NOTE — Telephone Encounter (Signed)
Patient is calling to let Dr. Clelia Croft know that on her appointment card she doesn't have written that she has an appointment on April 1st.

## 2014-08-08 NOTE — Telephone Encounter (Signed)
Appt on 5/16 now. FYI Dr. Clelia Croft

## 2014-08-09 NOTE — Telephone Encounter (Signed)
Oh good!  It is just so unusual for pt to miss her appt that I was a little worried about her - glad she is doing well.  Will she need any med refills to get her to that appt?  If so, will be in 102 tomorrow so she can swing by and pick up any needed rxs at her convenience.

## 2014-08-11 NOTE — Telephone Encounter (Signed)
lmom to cb. 

## 2014-08-12 NOTE — Telephone Encounter (Signed)
Called pt, advised to let me know if she needs refills on any medications.

## 2014-09-05 ENCOUNTER — Encounter: Payer: Self-pay | Admitting: Family Medicine

## 2014-09-05 ENCOUNTER — Ambulatory Visit (INDEPENDENT_AMBULATORY_CARE_PROVIDER_SITE_OTHER): Payer: Medicare Other | Admitting: Family Medicine

## 2014-09-05 VITALS — BP 137/85 | HR 106 | Temp 98.5°F | Resp 18 | Ht 65.0 in | Wt 226.0 lb

## 2014-09-05 DIAGNOSIS — I10 Essential (primary) hypertension: Secondary | ICD-10-CM

## 2014-09-05 DIAGNOSIS — E119 Type 2 diabetes mellitus without complications: Secondary | ICD-10-CM | POA: Diagnosis not present

## 2014-09-05 DIAGNOSIS — D649 Anemia, unspecified: Secondary | ICD-10-CM

## 2014-09-05 DIAGNOSIS — H1012 Acute atopic conjunctivitis, left eye: Secondary | ICD-10-CM

## 2014-09-05 DIAGNOSIS — M1712 Unilateral primary osteoarthritis, left knee: Secondary | ICD-10-CM

## 2014-09-05 DIAGNOSIS — M545 Low back pain: Secondary | ICD-10-CM

## 2014-09-05 DIAGNOSIS — Z79899 Other long term (current) drug therapy: Secondary | ICD-10-CM | POA: Diagnosis not present

## 2014-09-05 DIAGNOSIS — K219 Gastro-esophageal reflux disease without esophagitis: Secondary | ICD-10-CM

## 2014-09-05 DIAGNOSIS — R05 Cough: Secondary | ICD-10-CM

## 2014-09-05 DIAGNOSIS — Z720 Tobacco use: Secondary | ICD-10-CM

## 2014-09-05 DIAGNOSIS — J309 Allergic rhinitis, unspecified: Secondary | ICD-10-CM

## 2014-09-05 DIAGNOSIS — R062 Wheezing: Secondary | ICD-10-CM | POA: Diagnosis not present

## 2014-09-05 MED ORDER — ALBUTEROL SULFATE (2.5 MG/3ML) 0.083% IN NEBU
2.5000 mg | INHALATION_SOLUTION | Freq: Once | RESPIRATORY_TRACT | Status: AC
  Administered 2014-09-05: 2.5 mg via RESPIRATORY_TRACT

## 2014-09-05 MED ORDER — AZELASTINE HCL 0.05 % OP SOLN: 1.0000 [drp] | Freq: Two times a day (BID) | OPHTHALMIC | Status: DC

## 2014-09-05 MED ORDER — CETIRIZINE HCL 10 MG PO TABS: 10.0000 mg | ORAL_TABLET | Freq: Every day | ORAL | Status: DC

## 2014-09-05 MED ORDER — BENAZEPRIL HCL 40 MG PO TABS: 40.0000 mg | ORAL_TABLET | Freq: Every evening | ORAL | Status: DC

## 2014-09-05 MED ORDER — HYDROCODONE-ACETAMINOPHEN 7.5-325 MG PO TABS: 1.0000 | ORAL_TABLET | Freq: Four times a day (QID) | ORAL | Status: DC | PRN

## 2014-09-05 MED ORDER — DIAZEPAM 5 MG PO TABS
5.0000 mg | ORAL_TABLET | Freq: Every evening | ORAL | Status: DC | PRN
Start: 2014-09-05 — End: 2015-01-08

## 2014-09-05 MED ORDER — CYCLOBENZAPRINE HCL 10 MG PO TABS: 10.0000 mg | ORAL_TABLET | Freq: Three times a day (TID) | ORAL | Status: DC | PRN

## 2014-09-05 MED ORDER — ALBUTEROL SULFATE HFA 108 (90 BASE) MCG/ACT IN AERS: 2.0000 | INHALATION_SPRAY | RESPIRATORY_TRACT | Status: DC | PRN

## 2014-09-05 MED ORDER — BUDESONIDE-FORMOTEROL FUMARATE 160-4.5 MCG/ACT IN AERO: 1.0000 | INHALATION_SPRAY | Freq: Two times a day (BID) | RESPIRATORY_TRACT | Status: DC

## 2014-09-05 MED ORDER — RANITIDINE HCL 150 MG PO TABS: 150.0000 mg | ORAL_TABLET | Freq: Two times a day (BID) | ORAL | Status: DC

## 2014-09-05 MED ORDER — TRAMADOL HCL 50 MG PO TABS: 100.0000 mg | ORAL_TABLET | Freq: Four times a day (QID) | ORAL | Status: DC

## 2014-09-05 MED ORDER — IPRATROPIUM BROMIDE 0.02 % IN SOLN
0.5000 mg | Freq: Once | RESPIRATORY_TRACT | Status: AC
Start: 2014-09-05 — End: 2014-09-05
  Administered 2014-09-05: 0.5 mg via RESPIRATORY_TRACT

## 2014-09-05 NOTE — Progress Notes (Addendum)
Subjective:   This chart was scribed for Dr. Norberto Sorenson, MD by Jarvis Morgan, Kaitlin Scribe. This patient was seen in Room 25 and the patient's care was started at 2:44 PM.   Patient ID: Kaitlin Cantrell, female    DOB: 1955/12/13, 59 y.o.   MRN: [id number]  Chief Complaint  Patient presents with  . Follow-up    DIABETES, HTN    HPI HPI Comments: Kaitlin Cantrell is a 59 y.o. female who presents to the [facility] for a 6 month follow up of her DM and HTN. Pt notes that she has been having some allergy symptoms recently. She is having redness and itching to her left eye along with rhinorrhea and nasal congestion. She has been taking Benadryl with mild relief. She does not take any OTC allergy medications. Pt does not check her BP regularly at home. She notes she has been under a lot of stress lately due to work and states she is surprised that her BP is not higher. She is also under a lot of stress due to her mother's Alzheimer's diagnosis. She states her mother has become combative in her illness and it has been very hard on her. Pt is living with her mother in order to take care of her. She states the stress has made it hard for her to relax and sleep at night. She states she has taken 5 mg Valium in the past with relief.  She notes some mild muscles pains in her lower extremities but that has been an ongoing issue. Pt does not check her sugars at home. Pt is current every day smoker for 40 years. She denies any other issues at this time.    Patient Active Problem List   Diagnosis Date Noted  . Diabetes mellitus, type II 03/30/2012  . Polypharmacy 03/30/2012  . Osteoarthritis of left knee 02/17/2012    Class: Diagnosis of  . HTN (hypertension) 05/10/2011   Past Medical History  Diagnosis Date  . Allergy     seasonal  . Arthritis   . Diabetes mellitus   . GERD (gastroesophageal reflux disease)   . Hyperlipidemia   . Hypertension   . History of pneumonia     15  years ago   Past Surgical History  Procedure Laterality Date  . Ankle fracture surgery  1974    left and pinned then pins removed  . Ankle ganglion cyst excision  1994    right  . Back surgery  1998    discectomy and then fusion  . Umbilical hernia repair  1996  . Tubal ligation  1996  . Tonsillectomy  1972  . Knee arthroplasty  02/14/2012    Procedure: COMPUTER ASSISTED TOTAL KNEE ARTHROPLASTY;  Surgeon: Cammy Copa, MD;  Location: [facility] OR;  Service: Orthopedics;  Laterality: Left;  Left total knee arthroplasty   Allergies  Allergen Reactions  . Morphine And Related Other (See Comments)    Very aggressive and doesn't help pain   Prior to Admission medications   Medication Sig Start Date End Date Taking? Authorizing Provider  aspirin EC 81 MG tablet Take 81 mg by mouth daily.   Yes Historical Provider, MD  benazepril (LOTENSIN) 40 MG tablet Take 1 tablet (40 mg total) by mouth every evening. 08/27/13  Yes Sherren Mocha, MD  cyclobenzaprine (FLEXERIL) 10 MG tablet Take 1 tablet (10 mg total) by mouth 3 (three) times daily as needed for muscle spasms. 05/09/14  Yes Sherren Mocha, MD  glimepiride (AMARYL) 2 MG tablet TAKE ONE TABLET BY MOUTH TWICE DAILY 04/21/14  Yes Sherren Mocha, MD  HYDROcodone-acetaminophen (NORCO) 7.5-325 MG per tablet Take 1 tablet by mouth every 6 (six) hours as needed for moderate pain. May fill on or after 90 days from date written 09/05/14  Yes Sherren Mocha, MD  HYDROcodone-acetaminophen [facility]) 7.5-325 MG per tablet Take 1 tablet by mouth every 6 (six) hours as needed for moderate pain. May fill on or after 30 days from date written 09/05/14  Yes Sherren Mocha, MD  HYDROcodone-acetaminophen [facility]) 7.5-325 MG per tablet Take 1 tablet by mouth every 6 (six) hours as needed for moderate pain. 09/05/14  Yes Sherren Mocha, MD  HYDROcodone-acetaminophen (NORCO) 7.5-325 MG per tablet Take 1 tablet by mouth every 6 (six) hours as needed for severe pain. May fill on or after 60 days from  date written 09/05/14  Yes Sherren Mocha, MD  [employer] CAPS Take 1 capsule by mouth 2 (two) times daily.    Yes Historical Provider, MD  metFORMIN (GLUCOPHAGE) 850 MG tablet TAKE ONE TABLET BY MOUTH TWICE DAILY WITH MEALS 04/21/14  Yes Sherren Mocha, MD  omeprazole (PRILOSEC) 20 MG capsule Take 1 capsule (20 mg total) by mouth daily. 09/07/12  Yes Sherren Mocha, MD  ranitidine (ZANTAC) 150 MG tablet Take 1 tablet (150 mg total) by mouth 2 (two) times daily. 04/22/14  Yes Sherren Mocha, MD  traMADol (ULTRAM) 50 MG tablet Take 2 tablets (100 mg total) by mouth 4 (four) times daily. Maximum dose= 8 tablets per day. 04/18/14  Yes Sherren Mocha, MD  diazepam (VALIUM) 5 MG tablet Take 1 tablet (5 mg total) by mouth at bedtime and may repeat dose one time if needed. 09/05/14   Sherren Mocha, MD  promethazine (PHENERGAN) 25 MG tablet Take 1 tablet (25 mg total) by mouth every 6 (six) hours as needed for nausea or vomiting. Patient not taking: Reported on 09/05/2014 12/24/13   Sherren Mocha, MD   History   Social History  . Marital Status: Married    Spouse Name: N/A  . Number of Children: 0  . Years of Education: N/A   Occupational History  . retired    Social History Main Topics  . Smoking status: Current Every Day Smoker -- 1.00 packs/day for 40 years    Types: Cigarettes  . Smokeless tobacco: Never Used  . Alcohol Use: No  . Drug Use: No  . Sexual Activity: Not on file   Other Topics Concern  . Not on file   Social History Narrative   Exercise walking daily (varies)      Review of Systems  Constitutional: Positive for activity change and fatigue. Negative for fever, chills, appetite change and unexpected weight change.  HENT: Positive for congestion, postnasal drip and rhinorrhea. Negative for facial swelling, mouth sores, nosebleeds, sinus pressure, trouble swallowing and voice change.   Eyes: Positive for discharge, redness and itching.  Respiratory: Positive for cough. Negative for chest tightness,  shortness of breath and wheezing.   Cardiovascular: Positive for leg swelling. Negative for chest pain and palpitations.  Musculoskeletal: Positive for myalgias, back pain, joint swelling and arthralgias. Negative for gait problem.  Skin: Negative for color change and wound.  Neurological: Negative for weakness and numbness.  Psychiatric/Behavioral: Positive for sleep disturbance (due to stress) and dysphoric mood. Negative for behavioral problems and confusion. The patient is  nervous/anxious (life stressors).       Triage Vitals: BP 137/85 mmHg  Pulse 106  Temp(Src) 98.5 F (36.9 C) (Oral)  Resp 18  Ht  (1.651 m)  Wt 226 lb (102.513 kg)  BMI 37.61 kg/m2  SpO2 95%  Objective:   Physical Exam  Constitutional: She is oriented to person, place, and time. She appears well-developed and well-nourished. No distress.  HENT:  Head: Normocephalic and atraumatic.  Right Ear: Tympanic membrane and external ear normal.  Left Ear: Tympanic membrane and external ear normal.  Nose: Nose normal.  Mouth/Throat: Uvula is midline and mucous membranes are normal. Posterior oropharyngeal erythema present.  Eyes: Conjunctivae and EOM are normal.  Neck: Neck supple. No tracheal deviation present. No thyroid mass and no thyromegaly present.  Cardiovascular: Normal rate, regular rhythm, S1 normal, S2 normal and normal heart sounds.   No murmur heard. Pulmonary/Chest: Effort normal. No respiratory distress. She has wheezes.  Musculoskeletal: Normal range of motion.  Lymphadenopathy:       Head (right side): Tonsillar adenopathy present.       Head (left side): Tonsillar adenopathy present.    She has cervical adenopathy (anterior).  Neurological: She is alert and oriented to person, place, and time.  Skin: Skin is warm and dry.  Psychiatric: She has a normal mood and affect. Her behavior is normal.  Nursing note and vitals reviewed.    Assessment & Plan:   Refill metformin and Amaryl  medications after labs are drawn  1. Essential hypertension   2. Type 2 diabetes mellitus without complication - increase metformin from 850 to 1000 bid as a1c bumped up to 7.2 today from 6.7 prior.  Cont amaryl  bid. Recheck in 4 mos.  3. Polypharmacy   4. Primary osteoarthritis of left knee   5. Allergic conjunctivitis and rhinitis, left   6. Tobacco abuse   7. Low back pain, unspecified back pain laterality, with sciatica presence unspecified   8. Gastroesophageal reflux disease, esophagitis presence not specified   Anemia - slightly worsened from prior - does not appear that pt has any h/o evaluation as to etiology - check ferritin and look into further at next OV in 4 mos.  Orders Placed This Encounter  Procedures  . Comprehensive metabolic panel    Order Specific Question:  Has the patient fasted?    Answer:  Yes  . Lipid panel    Order Specific Question:  Has the patient fasted?    Answer:  Yes  . CBC  . Hemoglobin A1c    Meds ordered this encounter  Medications  . HYDROcodone-acetaminophen (NORCO) 7.5-325 MG per tablet    Sig: Take 1 tablet by mouth every 6 (six) hours as needed for moderate pain. May fill on or after 90 days from date written    Dispense:  90 tablet    Refill:  0  . HYDROcodone-acetaminophen (NORCO) 7.5-325 MG per tablet    Sig: Take 1 tablet by mouth every 6 (six) hours as needed for moderate pain. May fill on or after 30 days from date written    Dispense:  90 tablet    Refill:  0  . HYDROcodone-acetaminophen (NORCO) 7.5-325 MG per tablet    Sig: Take 1 tablet by mouth every 6 (six) hours as needed for moderate pain.    Dispense:  90 tablet    Refill:  0  . HYDROcodone-acetaminophen (NORCO) 7.5-325 MG per tablet    Sig: Take 1 tablet by mouth  every 6 (six) hours as needed for severe pain. May fill on or after 60 days from date written    Dispense:  90 tablet    Refill:  0  . diazepam (VALIUM) 5 MG tablet    Sig: Take 1 tablet (5 mg total) by  mouth at bedtime and may repeat dose one time if needed.    Dispense:  60 tablet    Refill:  3  . traMADol (ULTRAM) 50 MG tablet    Sig: Take 2 tablets (100 mg total) by mouth 4 (four) times daily. Maximum dose= 8 tablets per day.    Dispense:  240 tablet    Refill:  3  . ranitidine (ZANTAC) 150 MG tablet    Sig: Take 1 tablet (150 mg total) by mouth 2 (two) times daily.    Dispense:  180 tablet    Refill:  2  . albuterol (PROVENTIL) (2.5 MG/3ML) 0.083% nebulizer solution 2.5 mg    Sig:   . ipratropium (ATROVENT) nebulizer solution 0.5 mg    Sig:   . benazepril (LOTENSIN) 40 MG tablet    Sig: Take 1 tablet (40 mg total) by mouth every evening.    Dispense:  90 tablet    Refill:  3  . cyclobenzaprine (FLEXERIL) 10 MG tablet    Sig: Take 1 tablet (10 mg total) by mouth 3 (three) times daily as needed for muscle spasms.    Dispense:  90 tablet    Refill:  2  . cetirizine (ZYRTEC) 10 MG tablet    Sig: Take 1 tablet (10 mg total) by mouth at bedtime.    Dispense:  30 tablet    Refill:  11  . azelastine (OPTIVAR) 0.05 % ophthalmic solution    Sig: Place 1 drop into both eyes 2 (two) times daily.    Dispense:  6 mL    Refill:  12  . budesonide-formoterol (SYMBICORT) 160-4.5 MCG/ACT inhaler    Sig: Inhale 1 puff into the lungs 2 (two) times daily.    Dispense:  1 Inhaler    Refill:  12  . albuterol (PROVENTIL HFA;VENTOLIN HFA) 108 (90 BASE) MCG/ACT inhaler    Sig: Inhale 2 puffs into the lungs every 4 (four) hours as needed for wheezing or shortness of breath (cough, shortness of breath or wheezing.).    Dispense:  1 Inhaler    Refill:  1    I personally performed the services described in this documentation, which was scribed in my presence. The recorded information has been reviewed and considered, and addended by me as needed.  Norberto Sorenson, MD MPH

## 2014-09-05 NOTE — Patient Instructions (Addendum)
Alzheimer Disease Caregiver Guide Alzheimer disease is an illness that affects a person's brain. It causes a person to lose the ability to remember things and make good decisions. As the disease progresses, the person is unable to take care of himself or herself and needs more and more help to do simple tasks. Taking care of someone with Alzheimer disease can be very challenging and overwhelming.  MEMORY LOSS AND CONFUSION Memory loss and confusion is mild in the beginning stages of the disease. Both of these problems become more severe as the disease progresses. Eventually, the person will not recognize places or even close family members and friends.   Stay calm.  Respond with a short explanation. Long explanations can be overwhelming and confusing.  Avoid corrections that sound like scolding.  Try not to take it personally, even if the person forgets your name. BEHAVIOR CHANGES Behavior changes are part of the disease. The person may develop depression, anxiety, anger, hallucinations, or other behavior changes. These changes can come on suddenly and may be in response to pain, infection, changes in the environment (temperature, noise), overstimulation, or feeling lost or scared.   Try not to take behavior changes personally.  Remain calm and patient.  Do not argue or try to convince the person about a specific point. This will only make him or her more agitated.  Know that the behavior changes are part of the disease process and try to work through it. TIPS TO REDUCE FRUSTRATION  Schedule wisely by making appointments and doing daily tasks, like bathing and dressing, when the person is at his or her best.  Take your time. Simple tasks may take a lot longer, so be sure to allow for plenty of time.  Limit choices. Too many choices can be overwhelming and stressful for the person.  Involve the person in what you are doing.  Stick to a routine.  Avoid new or crowded situations, if  possible.  Use simple words, short sentences, and a calm voice. Only give one direction at a time.  Buy clothes and shoes that are easy to put on and take off.  Let people help if they offer. HOME SAFETY Keeping the home safe is very important to reduce the risk of falls and injuries.   Keep floors clear of clutter. Remove rugs, magazine racks, and floor lamps.  Keep hallways well lit.  Put a handrail and nonslip mat in the bathtub or shower.  Put childproof locks on cabinets with dangerous items, such as medicine, alcohol, guns, toxic cleaning items, sharp tools or utensils, matches, or lighters.  Place locks on doors where the person cannot easily see or reach them. This helps ensure that the person cannot wander out of the house and get lost.  Be prepared for emergencies. Keep a list of emergency phone numbers and addresses in a convenient area. PLANS FOR THE FUTURE  Do not put off talking about finances.  Talk about money management. People with Alzheimer disease have trouble managing their money as the disease gets worse.  Get help from professional advisors regarding financial and legal matters.  Do not put off talking about future care.  Choose a power of attorney. This is someone who can make decisions for the person with Alzheimer disease when he or she is no longer able to do so.  Talk about driving and when it is the right time to stop. The person's health care provider can help give advice on this matter.  Talk about  the person's living situation. If he or she lives alone, you need to make sure he or she is safe. Some people need extra help at home, and others need more care at a nursing home or care center. SUPPORT GROUPS Joining a support group can be very helpful for caregivers of people with Alzheimer disease. Some advantages to being part of a support group include:   Getting strategies to manage stress.  Sharing experiences with others.  Receiving  emotional comfort and support.  Learning new caregiving skills as the disease progresses.  Knowing what community resources are available and taking advantage of them. SEEK MEDICAL CARE IF:  The person has a fever.  The person has a sudden change in behavior that does not improve with calming strategies.  The person is unable to manage in his or her current living situation.  The person threatens you or anyone else, including himself or herself.  You are no longer able to care for the person. Document Released: 12/29/2003 Document Revised: 09/02/2013 Document Reviewed: 05/25/2011 [facility] Patient Information 2015 [place], [place]. This information is not intended to replace advice given to you by your health care provider. Make sure you discuss any questions you have with your health care provider. Community Occupational psychologist of Services Cost  A Matter of Balance Class locations vary. Call [place] on Aging for more information.  [email or web address] [phone] 8-Session program addressing the fear of falling and increasing activity levels of older adults Free to minimal cost  A.C.T. By [employer] [address], [place], [place] [address].  BetaBlues.[email or web address] [phone]  Personal training, gym, classes including Silver Sneakers* and ACTion for Aging Adults Fee-based  A.H.O.Y. (Add Health to [place]) Airs on Time [employer] 13, M-F at [place]: [employer],  [place] [employer] [employer] [place],  [place], [facility], [address] [facility], [place], [place], [place] [address]  [place] Location: Sharrell Ku. [employer] [place]      [phone]  [phone]  [phone]  [phone]  [phone]  [phone]  [phone]  [phone]  [phone]  [phone]    [phone] A total-body conditioning class for adults 32 and older; designed to increase muscular strength, endurance, range of movement, flexibility, balance, agility and coordination Free  [facility] [place], [place] [address] [place]      [address]      [phone]      Pilate's class for individualsreturning to exercise after an injury, before or after surgery or for individuals with complex musculoskeletal issues; designed to improve strength, balance , flexibility      $15/class  [place] [address] [place], [place] [address] [email or web address] [place] classes for beginners to advanced [place], [place] [address] [email or web address] [place] Chair Exercises Free, ages 59 and older; Ages 36-59 fee based  Marvia [place], [facility] [address] [place],  [address] [email or web address] .[email or web address] Nims [phone]  A.H.O.Y. Tai Chi Fee-based Donation based or free  [place] Class locations vary.  Call or email Angela Burke or view website for more information. [email or web address] [phone] Ongoing classes at local [employer] and gyms Fee-based  Silver Sneakers A.C.T. By [place] [employer]: [place] [phone]  [phone] Classes designed for older adults who want to improve their strength, flexibility, balance and endurance.   Silver sneakers is covered by some insurance plans and includes a fitness center membership at participating locations. Find out more by calling [phone] or visiting [email or web address] Covered by some insurance plans  [facility] [phone] A.H.O.Y., fitness room, personal training, fitness classes for injury prevention, strength, balance, flexibility, water fitness classes Ages 55+: $35 for 6 months; Ages 34-54: $25 for 6 months  Tai Chi for Everybody [facility] [address] [place], [place] [address] [email or web address] [phone] Tai Chi classes for beginners to advanced; geared for seniors Donation Based      [employer] ([place] Others Participate in Exercise     Loyal Gambler. Rosana Hoes, PhD, [place] [email or web address] [place]     [phone]     A comprehensive fitness program for adults.  The program paris senior-level undergraduates Kinesiology students with adults who desire to learn how to exercise safely.  Includes a structural exercise class focusing on functional fitnesss     $100/semester in fall and spring; $75 in summer (no trainers)    *Silver Sneakers is covered by some Personal assistant and includes a  Radio producer at participating locations.  Find out more by calling [phone] or visiting [email or web address]  For additional health and human services resources for senior adults, please contact SeniorLine at [phone] in [place] and [place] at [phone] in all other areas.

## 2014-09-08 ENCOUNTER — Telehealth: Payer: Self-pay | Admitting: *Deleted

## 2014-09-08 NOTE — Telephone Encounter (Signed)
Left message for pt to call back in regards to needing handicap placard. Per Dr. Clelia Croft, need to find out if she wants it mailed to her, or if she would like to just come pick it up when she comes in to have her blood work done.

## 2014-09-09 ENCOUNTER — Other Ambulatory Visit: Payer: Self-pay | Admitting: *Deleted

## 2014-09-09 ENCOUNTER — Other Ambulatory Visit: Payer: Self-pay | Admitting: Family Medicine

## 2014-09-09 ENCOUNTER — Other Ambulatory Visit: Payer: Medicare Other

## 2014-09-09 DIAGNOSIS — N179 Acute kidney failure, unspecified: Secondary | ICD-10-CM | POA: Diagnosis not present

## 2014-09-09 DIAGNOSIS — D649 Anemia, unspecified: Secondary | ICD-10-CM

## 2014-09-09 LAB — BASIC METABOLIC PANEL WITH GFR
BUN: 20 mg/dL (ref 6–23)
CHLORIDE: 102 meq/L (ref 96–112)
CO2: 26 meq/L (ref 19–32)
Calcium: 9.4 mg/dL (ref 8.4–10.5)
Creat: 1.17 mg/dL — ABNORMAL HIGH (ref 0.50–1.10)
GFR, Est African American: 60 mL/min
GFR, Est Non African American: 52 mL/min — ABNORMAL LOW
Glucose, Bld: 124 mg/dL — ABNORMAL HIGH (ref 70–99)
POTASSIUM: 5.8 meq/L — AB (ref 3.5–5.3)
Sodium: 137 mEq/L (ref 135–145)

## 2014-09-09 NOTE — Telephone Encounter (Signed)
Dr shaw? 

## 2014-09-09 NOTE — Telephone Encounter (Signed)
Called pt, advised placard ready to pick up.

## 2014-09-09 NOTE — Telephone Encounter (Signed)
I signed it yesterday and placed in envelope in front office waiting for pt's convenience. Sorry i forget at her visit.

## 2014-09-09 NOTE — Telephone Encounter (Signed)
Pt would like to come by and pick up the handicap placard. Please call [phone] if needed after 9:30

## 2014-09-10 ENCOUNTER — Encounter: Payer: Self-pay | Admitting: Family Medicine

## 2014-09-10 LAB — CBC WITH DIFFERENTIAL/PLATELET
Basophils Absolute: 0 10*3/uL (ref 0.0–0.1)
Basophils Relative: 0 % (ref 0–1)
Eosinophils Absolute: 0.3 10*3/uL (ref 0.0–0.7)
Eosinophils Relative: 3 % (ref 0–5)
HCT: 34.1 % — ABNORMAL LOW (ref 36.0–46.0)
Hemoglobin: 10.3 g/dL — ABNORMAL LOW (ref 12.0–15.0)
LYMPHS PCT: 29 % (ref 12–46)
Lymphs Abs: 2.9 10*3/uL (ref 0.7–4.0)
MCH: 23.3 pg — ABNORMAL LOW (ref 26.0–34.0)
MCHC: 30.2 g/dL (ref 30.0–36.0)
MCV: 77 fL — ABNORMAL LOW (ref 78.0–100.0)
MONO ABS: 0.7 10*3/uL (ref 0.1–1.0)
MPV: 8.7 fL (ref 8.6–12.4)
Monocytes Relative: 7 % (ref 3–12)
NEUTROS ABS: 6 10*3/uL (ref 1.7–7.7)
Neutrophils Relative %: 61 % (ref 43–77)
PLATELETS: 342 10*3/uL (ref 150–400)
RBC: 4.43 MIL/uL (ref 3.87–5.11)
RDW: 16.3 % — ABNORMAL HIGH (ref 11.5–15.5)
WBC: 9.9 10*3/uL (ref 4.0–10.5)

## 2014-09-10 LAB — HEPATIC FUNCTION PANEL
ALK PHOS: 86 U/L (ref 39–117)
ALT: <8 U/L (ref 0–35)
AST: 12 U/L (ref 0–37)
Albumin: 3.7 g/dL (ref 3.5–5.2)
BILIRUBIN TOTAL: 0.2 mg/dL (ref 0.2–1.2)
Bilirubin, Direct: 0.1 mg/dL (ref 0.0–0.3)
Indirect Bilirubin: 0.1 mg/dL — ABNORMAL LOW (ref 0.2–1.2)
Total Protein: 6.6 g/dL (ref 6.0–8.3)

## 2014-09-10 LAB — LIPID PANEL
Cholesterol: 148 mg/dL (ref 0–200)
HDL: 40 mg/dL — AB (ref >=46)
LDL Cholesterol: 83 mg/dL (ref 0–99)
Total CHOL/HDL Ratio: 3.7 Ratio
Triglycerides: 126 mg/dL (ref <150)
VLDL: 25 mg/dL (ref 0–40)

## 2014-09-10 LAB — HEMOGLOBIN A1C
HEMOGLOBIN A1C: 7.2 % — AB (ref <5.7)
Mean Plasma Glucose: 160 mg/dL — ABNORMAL HIGH (ref <117)

## 2014-09-10 NOTE — Progress Notes (Signed)
AMAZING!  Thanks!

## 2014-09-11 ENCOUNTER — Encounter: Payer: Self-pay | Admitting: Family Medicine

## 2014-09-11 LAB — FERRITIN: Ferritin: 11 ng/mL (ref 10–291)

## 2014-09-11 MED ORDER — METFORMIN HCL 1000 MG PO TABS: ORAL_TABLET | ORAL | Status: DC

## 2014-09-11 NOTE — Addendum Note (Signed)
Addended by: Norberto Sorenson on: 09/11/2014 01:13 AM   Modules accepted: Orders

## 2014-10-07 ENCOUNTER — Other Ambulatory Visit: Payer: Self-pay | Admitting: Family Medicine

## 2014-10-08 ENCOUNTER — Encounter: Payer: Self-pay | Admitting: *Deleted

## 2014-10-09 NOTE — Telephone Encounter (Signed)
Dr Clelia Croft, you last RFd this med in 07/2013 for a year, but I don't see it on pt's current med list. It was on list at 09/10/13 OV but not at 12/24/13 OV and I don't see notes about you stopping it. Do you want pt taking it? I don't see any other chol meds on current list either and her lipid test was just very good.

## 2014-10-20 MED ORDER — LOVASTATIN 20 MG PO TABS: 40.0000 mg | ORAL_TABLET | Freq: Every day | ORAL | Status: DC

## 2014-10-20 NOTE — Addendum Note (Signed)
Addended by: Norberto Sorenson on: 10/20/2014 04:01 PM   Modules accepted: Orders

## 2014-10-20 NOTE — Telephone Encounter (Signed)
Yes, pt should stay on statin due to DM and tobacco abuse. Refilled x 1 yr

## 2014-11-18 ENCOUNTER — Encounter: Payer: Self-pay | Admitting: Family Medicine

## 2015-01-08 ENCOUNTER — Ambulatory Visit (INDEPENDENT_AMBULATORY_CARE_PROVIDER_SITE_OTHER): Payer: Medicare Other | Admitting: Family Medicine

## 2015-01-08 ENCOUNTER — Encounter: Payer: Self-pay | Admitting: Family Medicine

## 2015-01-08 VITALS — BP 131/77 | HR 87 | Temp 98.7°F | Resp 16 | Ht 65.0 in | Wt 223.0 lb

## 2015-01-08 DIAGNOSIS — Z79899 Other long term (current) drug therapy: Secondary | ICD-10-CM | POA: Diagnosis not present

## 2015-01-08 DIAGNOSIS — D649 Anemia, unspecified: Secondary | ICD-10-CM | POA: Diagnosis not present

## 2015-01-08 DIAGNOSIS — J449 Chronic obstructive pulmonary disease, unspecified: Secondary | ICD-10-CM | POA: Diagnosis not present

## 2015-01-08 DIAGNOSIS — Z23 Encounter for immunization: Secondary | ICD-10-CM | POA: Diagnosis not present

## 2015-01-08 DIAGNOSIS — I10 Essential (primary) hypertension: Secondary | ICD-10-CM | POA: Diagnosis not present

## 2015-01-08 DIAGNOSIS — Z720 Tobacco use: Secondary | ICD-10-CM | POA: Diagnosis not present

## 2015-01-08 DIAGNOSIS — Z1239 Encounter for other screening for malignant neoplasm of breast: Secondary | ICD-10-CM | POA: Diagnosis not present

## 2015-01-08 DIAGNOSIS — M545 Low back pain: Secondary | ICD-10-CM

## 2015-01-08 DIAGNOSIS — E119 Type 2 diabetes mellitus without complications: Secondary | ICD-10-CM

## 2015-01-08 DIAGNOSIS — M1712 Unilateral primary osteoarthritis, left knee: Secondary | ICD-10-CM

## 2015-01-08 LAB — POCT GLYCOSYLATED HEMOGLOBIN (HGB A1C): Hemoglobin A1C: 6.5

## 2015-01-08 MED ORDER — GLIMEPIRIDE 2 MG PO TABS: ORAL_TABLET | ORAL | Status: DC

## 2015-01-08 MED ORDER — BENAZEPRIL HCL 40 MG PO TABS: 40.0000 mg | ORAL_TABLET | Freq: Every evening | ORAL | Status: DC

## 2015-01-08 MED ORDER — HYDROCODONE-ACETAMINOPHEN 7.5-325 MG PO TABS: 1.0000 | ORAL_TABLET | Freq: Four times a day (QID) | ORAL | Status: DC | PRN

## 2015-01-08 MED ORDER — LOVASTATIN 20 MG PO TABS: 40.0000 mg | ORAL_TABLET | Freq: Every day | ORAL | Status: DC

## 2015-01-08 MED ORDER — DIAZEPAM 5 MG PO TABS: 5.0000 mg | ORAL_TABLET | Freq: Every evening | ORAL | Status: DC | PRN

## 2015-01-08 MED ORDER — TRAMADOL HCL 50 MG PO TABS: 100.0000 mg | ORAL_TABLET | Freq: Four times a day (QID) | ORAL | Status: DC

## 2015-01-08 MED ORDER — CYCLOBENZAPRINE HCL 10 MG PO TABS: 10.0000 mg | ORAL_TABLET | Freq: Three times a day (TID) | ORAL | Status: DC | PRN

## 2015-01-08 NOTE — Patient Instructions (Addendum)
is now offering annual lung cancer screening by low-dose CT scan.  This is covered for qualifying patients and your insurance will be checked before the procedure.  Call the lung cancer screening nurse navigators at [phone] to learn more about this and get scheduled.  Tdap Vaccine (Tetanus, Diphtheria, Pertussis): What You Need to Know 1. Why get vaccinated? Tetanus, diphtheria and pertussis can be very serious diseases, even for adolescents and adults. Tdap vaccine can protect [place] from these diseases. TETANUS (Lockjaw) causes painful muscle tightening and stiffness, usually all over the body.  It can lead to tightening of muscles in the head and neck so you can't open your mouth, swallow, or sometimes even breathe. Tetanus kills about 1 out of 5 people who are infected. DIPHTHERIA can cause a thick coating to form in the back of the throat.  It can lead to breathing problems, paralysis, heart failure, and death. PERTUSSIS (Whooping Cough) causes severe coughing spells, which can cause difficulty breathing, vomiting and disturbed sleep.  It can also lead to weight loss, incontinence, and rib fractures. Up to 2 in 100 adolescents and 5 in 100 adults with pertussis are hospitalized or have complications, which could include pneumonia or death. These diseases are caused by bacteria. Diphtheria and pertussis are spread from person to person through coughing or sneezing. Tetanus enters the body through cuts, scratches, or wounds. Before vaccines, the [place] saw as many as 200,000 cases a year of diphtheria and pertussis, and hundreds of cases of tetanus. Since vaccination began, tetanus and diphtheria have dropped by about 99% and pertussis by about 80%. 2. Tdap vaccine Tdap vaccine can protect adolescents and adults from tetanus, diphtheria, and pertussis. One dose of Tdap is routinely given at age 21 or 53. People who did not get Tdap at that age should get it as soon as  possible. Tdap is especially important for health care professionals and anyone having close contact with a baby younger than 12 months. Pregnant women should get a dose of Tdap during every pregnancy, to protect the newborn from pertussis. Infants are most at risk for severe, life-threatening complications from pertussis. A similar vaccine, called Td, protects from tetanus and diphtheria, but not pertussis. A Td booster should be given every 10 years. Tdap may be given as one of these boosters if you have not already gotten a dose. Tdap may also be given after a severe cut or burn to prevent tetanus infection. Your doctor can give you more information. Tdap may safely be given at the same time as other vaccines. 3. Some people should not get this vaccine  If you ever had a life-threatening allergic reaction after a dose of any tetanus, diphtheria, or pertussis containing vaccine, OR if you have a severe allergy to any part of this vaccine, you should not get Tdap. Tell your doctor if you have any severe allergies.  If you had a coma, or long or multiple seizures within 7 days after a childhood dose of DTP or DTaP, you should not get Tdap, unless a cause other than the vaccine was found. You can still get Td.  Talk to your doctor if you:  have epilepsy or another nervous system problem,  had severe pain or swelling after any vaccine containing diphtheria, tetanus or pertussis,  ever had Guillain-Barr Syndrome (GBS),  aren't feeling well on the day the shot is scheduled. 4. Risks of a vaccine reaction With any medicine, including vaccines, there is a chance of  side effects. These are usually mild and go away on their own, but serious reactions are also possible. Brief fainting spells can follow a vaccination, leading to injuries from falling. Sitting or lying down for about 15 minutes can help prevent these. Tell your doctor if you feel dizzy or light-headed, or have vision changes or ringing  in the ears. Mild problems following Tdap (Did not interfere with activities)  Pain where the shot was given (about 3 in 4 adolescents or 2 in 3 adults)  Redness or swelling where the shot was given (about 1 person in 5)  Mild fever of at least 100.5F (up to about 1 in 25 adolescents or 1 in 100 adults)  Headache (about 3 or 4 people in 10)  Tiredness (about 1 person in 3 or 4)  Nausea, vomiting, diarrhea, stomach ache (up to 1 in 4 adolescents or 1 in 10 adults)  Chills, body aches, sore joints, rash, swollen glands (uncommon) Moderate problems following Tdap (Interfered with activities, but did not require medical attention)  Pain where the shot was given (about 1 in 5 adolescents or 1 in 100 adults)  Redness or swelling where the shot was given (up to about 1 in 16 adolescents or 1 in 25 adults)  Fever over 102F (about 1 in 100 adolescents or 1 in 250 adults)  Headache (about 3 in 20 adolescents or 1 in 10 adults)  Nausea, vomiting, diarrhea, stomach ache (up to 1 or 3 people in 100)  Swelling of the entire arm where the shot was given (up to about 3 in 100). Severe problems following Tdap (Unable to perform usual activities; required medical attention)  Swelling, severe pain, bleeding and redness in the arm where the shot was given (rare). A severe allergic reaction could occur after any vaccine (estimated less than 1 in a million doses). 5. What if there is a serious reaction? What should I look for?  Look for anything that concerns you, such as signs of a severe allergic reaction, very high fever, or behavior changes. Signs of a severe allergic reaction can include hives, swelling of the face and throat, difficulty breathing, a fast heartbeat, dizziness, and weakness. These would start a few minutes to a few hours after the vaccination. What should I do?  If you think it is a severe allergic reaction or other emergency that can't wait, call 9-1-1 or get the person  to the nearest hospital. Otherwise, call your doctor.  Afterward, the reaction should be reported to the "Vaccine Adverse Event Reporting System" (VAERS). Your doctor might file this report, or you can do it yourself through the VAERS web site at www.vaers.LAgents.no, or by calling 1-339-145-8434. VAERS is only for reporting reactions. They do not give medical advice.  6. The National Vaccine Injury Compensation Program The [employer] Vaccine Injury Compensation Program (VICP) is a federal program that was created to compensate people who may have been injured by certain vaccines. Persons who believe they may have been injured by a vaccine can learn about the program and about filing a claim by calling 1-458-674-7980 or visiting the VICP website at SpiritualWord.at. 7. How can I learn more?  Ask your doctor.  Call your local or state health department.  Contact the Centers for Disease Control and Prevention (CDC):  Call 416-376-2325 or visit CDC's website at PicCapture.uy. CDC Tdap Vaccine VIS (09/08/11) Document Released: 10/18/2011 Document Revised: 09/02/2013 Document Reviewed: 07/31/2013 ExitCare Patient Information 2015 [place], [place]. This information is not intended to  replace advice given to you by your health care provider. Make sure you discuss any questions you have with your health care provider.  

## 2015-01-08 NOTE — Progress Notes (Signed)
Subjective:    Patient ID: Kaitlin Cantrell, female    DOB: January 13, 1956, 59 y.o.   MRN: [id number] Chief Complaint  Patient presents with  . Medication Refill    HPI  Increase metformin from 850 to 1000bid as a1c and 6.7->7.2 on amaryl 2mg  bid  Anemia worse, unknown etiology  Pt has started green tea supp to help with energy and weight loss.  Pt is being run ragged and so has been smoking and eating more.  She has started using boost/ensure at times. She has had to move out of her home where her husband is and move in with her mother so she can care for her full time.  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  Pt's husband just had a stroke and came home from the hosp into their house - where Kaitlin Cantrell is not - so she has been trying to split some of her time between her husband and mother.  Past Medical History  Diagnosis Date  . Allergy     seasonal  . Arthritis   . Diabetes mellitus   . GERD (gastroesophageal reflux disease)   . Hyperlipidemia   . Hypertension   . History of pneumonia     15 years ago   Current Outpatient Prescriptions on File Prior to Visit  Medication Sig Dispense Refill  . aspirin EC 81 MG tablet Take 81 mg by mouth daily.    Marland Kitchen azelastine (OPTIVAR) 0.05 % ophthalmic solution Place 1 drop into both eyes 2 (two) times daily. 6 mL 12  . budesonide-formoterol (SYMBICORT) 160-4.5 MCG/ACT inhaler Inhale 1 puff into the lungs 2 (two) times daily. 1 Inhaler 12  . Krill Oil CAPS Take 1 capsule by mouth 2 (two) times daily.     . metFORMIN (GLUCOPHAGE) 1000 MG tablet TAKE ONE TABLET BY MOUTH TWICE DAILY WITH MEALS 180 tablet 3  . omeprazole (PRILOSEC) 20 MG capsule Take 1 capsule (20 mg total) by mouth  daily. 1 capsule 0  . ranitidine (ZANTAC) 150 MG tablet Take 1 tablet (150 mg total) by mouth 2 (two) times daily. 180 tablet 2   No current facility-administered medications on file prior to visit.   Allergies  Allergen Reactions  . Albuterol     Throat/lips swelling  . Morphine And Related Other (See Comments)    Very aggressive and doesn't help pain   Depression screen [facility] 2/9 09/05/2014  Decreased Interest 0  Down, Depressed, Hopeless 0  PHQ - 2 Score 0      Review of Systems  Constitutional: Positive for activity change, appetite change and fatigue. Negative for fever, chills and unexpected weight change.  Respiratory: Positive for cough. Negative for shortness of breath and wheezing.   Cardiovascular: Negative for chest pain, palpitations and leg swelling.  Gastrointestinal: Negative for vomiting, diarrhea and constipation.  Endocrine: Negative for polydipsia and polyuria.  Musculoskeletal: Positive for back pain and arthralgias. Negative for myalgias and gait problem.  Neurological: Negative for weakness.  Hematological: Negative for adenopathy.  Psychiatric/Behavioral: Negative for dysphoric mood.       Objective:   Physical Exam  Constitutional: She is oriented to person, place, and time. She  appears well-developed and well-nourished. No distress.  HENT:  Head: Normocephalic and atraumatic.  Right Ear: External ear normal.  Left Ear: External ear normal.  Eyes: Conjunctivae are normal. No scleral icterus.  Neck: Normal range of motion. Neck supple. No thyromegaly present.  Cardiovascular: Normal rate, regular rhythm, normal heart sounds and intact distal pulses.   Pulmonary/Chest: Effort normal. No respiratory distress. She has decreased breath sounds. She has wheezes in the right lower field.  Musculoskeletal: She exhibits no edema.  Lymphadenopathy:    She has no cervical adenopathy.  Neurological: She is alert and oriented to person, place, and time.  Skin:  Skin is warm and dry. She is not diaphoretic. No erythema.  Psychiatric: She has a normal mood and affect. Her behavior is normal.    BP 131/77 mmHg  Pulse 87  Temp(Src) 98.7 F (37.1 C)  Resp 16  Ht  (1.651 m)  Wt 223 lb (101.152 kg)  BMI 37.11 kg/m2    Assessment & Plan:   1. Need for prophylactic vaccination and inoculation against influenza   2. Need for Tdap vaccination - given today  3. Essential hypertension   4. Low back pain, unspecified back pain laterality, with sciatica presence unspecified - refilled chronic pain meds - has been stable for >5 yrs  5. Type 2 diabetes mellitus without complication   6. Primary osteoarthritis of left knee   7. Polypharmacy   8. Screening for breast cancer   9. Anemia, unspecified anemia type   10. Tobacco abuse - pt trying to cut down, gave phone # to sched for screening lung CT  11. Chronic obstructive pulmonary disease, unspecified COPD, unspecified chronic bronchitis type - no recent exac    Orders Placed This Encounter  Procedures  . MM Digital Screening    Order Specific Question:  Reason for Exam (SYMPTOM  OR DIAGNOSIS REQUIRED)    Answer:  screening for breast cancer    Order Specific Question:  Is the patient pregnant?    Answer:  No    Order Specific Question:  Preferred imaging location?    Answer:  [facility]  . Tdap vaccine greater than or equal to 7yo IM  . Flu Vaccine QUAD 36+ mos IM  . Comprehensive metabolic panel  . CBC  . Ambulatory referral to Ophthalmology    Referral Priority:  Routine    Referral Type:  Consultation    Referral Reason:  Specialty Services Required    Requested Specialty:  Ophthalmology    Number of Visits Requested:  1  . POCT glycosylated hemoglobin (Hb A1C)    Meds ordered this encounter  Medications  . benazepril (LOTENSIN) 40 MG tablet    Sig: Take 1 tablet (40 mg total) by mouth every evening.    Dispense:  90 tablet    Refill:  3  . traMADol (ULTRAM) 50 MG tablet     Sig: Take 2 tablets (100 mg total) by mouth 4 (four) times daily. Maximum dose= 8 tablets per day.    Dispense:  240 tablet    Refill:  3  . lovastatin (MEVACOR) 20 MG tablet    Sig: Take 2 tablets (40 mg total) by mouth daily at 6 PM.    Dispense:  180 tablet    Refill:  3  . HYDROcodone-acetaminophen (NORCO) 7.5-325 MG per tablet    Sig: Take 1 tablet by mouth every 6 (six) hours as needed for moderate pain. May fill on or after 90 days  from date written    Dispense:  90 tablet    Refill:  0  . glimepiride (AMARYL) 2 MG tablet    Sig: TAKE ONE TABLET BY MOUTH TWICE DAILY    Dispense:  180 tablet    Refill:  2  . diazepam (VALIUM) 5 MG tablet    Sig: Take 1 tablet (5 mg total) by mouth at bedtime and may repeat dose one time if needed.    Dispense:  60 tablet    Refill:  3  . cyclobenzaprine (FLEXERIL) 10 MG tablet    Sig: Take 1 tablet (10 mg total) by mouth 3 (three) times daily as needed for muscle spasms.    Dispense:  90 tablet    Refill:  2  . HYDROcodone-acetaminophen (NORCO) 7.5-325 MG per tablet    Sig: Take 1 tablet by mouth every 6 (six) hours as needed for moderate pain.    Dispense:  90 tablet    Refill:  0  . HYDROcodone-acetaminophen (NORCO) 7.5-325 MG per tablet    Sig: Take 1 tablet by mouth every 6 (six) hours as needed for moderate pain. May fill on or after 30 days from date written    Dispense:  90 tablet    Refill:  0  . HYDROcodone-acetaminophen (NORCO) 7.5-325 MG per tablet    Sig: Take 1 tablet by mouth every 6 (six) hours as needed for moderate pain. May fill on or after 60 days from date written    Dispense:  90 tablet    Refill:  0    Norberto Sorenson, MD MPH

## 2015-01-09 LAB — CBC
HCT: 37.4 % (ref 36.0–46.0)
Hemoglobin: 12.1 g/dL (ref 12.0–15.0)
MCH: 26.1 pg (ref 26.0–34.0)
MCHC: 32.4 g/dL (ref 30.0–36.0)
MCV: 80.8 fL (ref 78.0–100.0)
MPV: 9 fL (ref 8.6–12.4)
PLATELETS: 359 10*3/uL (ref 150–400)
RBC: 4.63 MIL/uL (ref 3.87–5.11)
RDW: 15.9 % — ABNORMAL HIGH (ref 11.5–15.5)
WBC: 10.1 10*3/uL (ref 4.0–10.5)

## 2015-01-09 LAB — COMPREHENSIVE METABOLIC PANEL
ALBUMIN: 4.3 g/dL (ref 3.6–5.1)
ALK PHOS: 81 U/L (ref 33–130)
ALT: 5 U/L — AB (ref 6–29)
AST: 13 U/L (ref 10–35)
BILIRUBIN TOTAL: 0.3 mg/dL (ref 0.2–1.2)
BUN: 22 mg/dL (ref 7–25)
CALCIUM: 9.9 mg/dL (ref 8.6–10.4)
CO2: 23 mmol/L (ref 20–31)
Chloride: 102 mmol/L (ref 98–110)
Creat: 1.04 mg/dL (ref 0.50–1.05)
Glucose, Bld: 107 mg/dL — ABNORMAL HIGH (ref 65–99)
Potassium: 4.9 mmol/L (ref 3.5–5.3)
Sodium: 139 mmol/L (ref 135–146)
Total Protein: 7.4 g/dL (ref 6.1–8.1)

## 2015-01-14 ENCOUNTER — Other Ambulatory Visit: Payer: Self-pay

## 2015-01-14 DIAGNOSIS — Z1231 Encounter for screening mammogram for malignant neoplasm of breast: Secondary | ICD-10-CM

## 2015-03-03 ENCOUNTER — Ambulatory Visit
Admission: RE | Admit: 2015-03-03 | Discharge: 2015-03-03 | Disposition: A | Discharge status: Home / Self Care | Payer: Medicare Other | Source: Ambulatory Visit | Attending: Family Medicine | Admitting: Family Medicine

## 2015-03-03 DIAGNOSIS — Z1231 Encounter for screening mammogram for malignant neoplasm of breast: Secondary | ICD-10-CM | POA: Diagnosis not present

## 2015-03-10 DIAGNOSIS — E119 Type 2 diabetes mellitus without complications: Secondary | ICD-10-CM | POA: Diagnosis not present

## 2015-03-10 DIAGNOSIS — H2513 Age-related nuclear cataract, bilateral: Secondary | ICD-10-CM | POA: Diagnosis not present

## 2015-03-10 LAB — HM DIABETES EYE EXAM

## 2015-04-01 ENCOUNTER — Encounter: Payer: Self-pay | Admitting: Family Medicine

## 2015-05-07 ENCOUNTER — Other Ambulatory Visit: Payer: Self-pay | Admitting: Family Medicine

## 2015-05-11 ENCOUNTER — Other Ambulatory Visit: Payer: Self-pay | Admitting: Family Medicine

## 2015-05-12 ENCOUNTER — Other Ambulatory Visit: Payer: Self-pay | Admitting: Family Medicine

## 2015-05-14 ENCOUNTER — Ambulatory Visit (INDEPENDENT_AMBULATORY_CARE_PROVIDER_SITE_OTHER): Payer: PPO | Admitting: Family Medicine

## 2015-05-14 ENCOUNTER — Encounter: Payer: Self-pay | Admitting: Family Medicine

## 2015-05-14 VITALS — BP 143/75 | HR 103 | Temp 98.9°F | Resp 16 | Ht 65.0 in | Wt 226.0 lb

## 2015-05-14 DIAGNOSIS — I10 Essential (primary) hypertension: Secondary | ICD-10-CM

## 2015-05-14 DIAGNOSIS — T783XXA Angioneurotic edema, initial encounter: Secondary | ICD-10-CM | POA: Diagnosis not present

## 2015-05-14 DIAGNOSIS — E119 Type 2 diabetes mellitus without complications: Secondary | ICD-10-CM | POA: Diagnosis not present

## 2015-05-14 DIAGNOSIS — Z23 Encounter for immunization: Secondary | ICD-10-CM | POA: Diagnosis not present

## 2015-05-14 DIAGNOSIS — G894 Chronic pain syndrome: Secondary | ICD-10-CM

## 2015-05-14 LAB — COMPREHENSIVE METABOLIC PANEL
ALBUMIN: 4.2 g/dL (ref 3.6–5.1)
ALK PHOS: 72 U/L (ref 33–130)
ALT: 4 U/L — AB (ref 6–29)
AST: 11 U/L (ref 10–35)
BUN: 16 mg/dL (ref 7–25)
CALCIUM: 9.6 mg/dL (ref 8.6–10.4)
CHLORIDE: 104 mmol/L (ref 98–110)
CO2: 27 mmol/L (ref 20–31)
Creat: 0.93 mg/dL (ref 0.50–1.05)
Glucose, Bld: 128 mg/dL — ABNORMAL HIGH (ref 65–99)
POTASSIUM: 5.4 mmol/L — AB (ref 3.5–5.3)
Sodium: 141 mmol/L (ref 135–146)
TOTAL PROTEIN: 6.7 g/dL (ref 6.1–8.1)
Total Bilirubin: 0.3 mg/dL (ref 0.2–1.2)

## 2015-05-14 MED ORDER — HYDROCODONE-ACETAMINOPHEN 7.5-325 MG PO TABS: 1.0000 | ORAL_TABLET | Freq: Four times a day (QID) | ORAL | Status: DC | PRN

## 2015-05-14 MED ORDER — CYCLOBENZAPRINE HCL 10 MG PO TABS: 10.0000 mg | ORAL_TABLET | Freq: Three times a day (TID) | ORAL | Status: DC | PRN

## 2015-05-14 MED ORDER — TRAMADOL HCL 50 MG PO TABS: ORAL_TABLET | ORAL | Status: DC

## 2015-05-14 MED ORDER — DIAZEPAM 5 MG PO TABS: ORAL_TABLET | ORAL | Status: DC

## 2015-05-14 MED ORDER — ZOSTER VACCINE LIVE 19400 UNT/0.65ML ~~LOC~~ SOLR: 0.6500 mL | Freq: Once | SUBCUTANEOUS | Status: DC

## 2015-05-14 MED ORDER — AMLODIPINE BESYLATE 10 MG PO TABS: 10.0000 mg | ORAL_TABLET | Freq: Every day | ORAL | Status: DC

## 2015-05-14 NOTE — Patient Instructions (Addendum)
[facility] [employer] [address] [place], [place] [address] [email or web address] [phone]   [facility] [address] [place], [place] [address] [email or web address] [phone]    For additional health and human services resources for senior adults, please contact [facility] at [phone] in [place] and [place] at [phone] in all other areas.  Try contact above #s or the PACE program or the Department of Health and Human Rescources about resources available to help keep senior citizens out of the nursing home.

## 2015-05-15 LAB — HEMOGLOBIN A1C
HEMOGLOBIN A1C: 7.1 % — AB (ref <5.7)
MEAN PLASMA GLUCOSE: 157 mg/dL — AB (ref <117)

## 2015-05-15 LAB — MICROALBUMIN, URINE: MICROALB UR: 5.2 mg/dL

## 2015-05-26 NOTE — Progress Notes (Signed)
Subjective:    Patient ID: Kaitlin Cantrell, female    DOB: 12-19-1955, 60 y.o.   MRN: [id number] Chief Complaint  Patient presents with  . Medication Refill  . Diabetes    HPI  Kaitlin Cantrell is a delightful 60 yo woman here for f/u of her chronic medical conditions and med refill.   Her husband has been in the hosp again and her mother's dementia has progressed which has all created a ton of stress for Kaitlin Cantrell to be caregiver for both but her husband has at least moved into the mother's with her which has helped sig - much easier than trying to bounce between two houses. Mother's dementia has resulted in her being rather mean and non-cooperative, won't eat, won't drink fluids - can't take her anywhere as she will usu refuse to get out of the car.  Past Medical History  Diagnosis Date  . Allergy     seasonal  . Arthritis   . Diabetes mellitus   . GERD (gastroesophageal reflux disease)   . Hyperlipidemia   . Hypertension   . History of pneumonia     15 years ago   Past Surgical History  Procedure Laterality Date  . Ankle fracture surgery  1974    left and pinned then pins removed  . Ankle ganglion cyst excision  1994    right  . Back surgery  1998    discectomy and then fusion  . Umbilical hernia repair  1996  . Tubal ligation  1996  . Tonsillectomy  1972  . Knee arthroplasty  02/14/2012    Procedure: COMPUTER ASSISTED TOTAL KNEE ARTHROPLASTY;  Surgeon: Cammy Copa, MD;  Location[facility] OR;  Service: Orthopedics;  Laterality: Left;  Left total knee arthroplasty   Current Outpatient Prescriptions on File Prior to Visit  Medication Sig Dispense Refill  . aspirin EC 81 MG tablet Take 81 mg by mouth daily.    Marland Kitchen azelastine (OPTIVAR) 0.05 % ophthalmic solution Place 1 drop into both eyes 2 (two) times daily. 6 mL 12  . budesonide-formoterol (SYMBICORT) 160-4.5 MCG/ACT inhaler Inhale 1 puff into the lungs 2 (two) times daily. 1 Inhaler 12  . glimepiride (AMARYL) 2 MG  tablet TAKE ONE TABLET BY MOUTH TWICE DAILY 180 tablet 2  . Krill Oil CAPS Take 1 capsule by mouth 2 (two) times daily.     Marland Kitchen lovastatin (MEVACOR) 20 MG tablet Take 2 tablets (40 mg total) by mouth daily at 6 PM. 180 tablet 3  . metFORMIN (GLUCOPHAGE) 1000 MG tablet TAKE ONE TABLET BY MOUTH TWICE DAILY WITH MEALS 180 tablet 3  . omeprazole (PRILOSEC) 20 MG capsule Take 1 capsule (20 mg total) by mouth daily. 1 capsule 0  . ranitidine (ZANTAC) 150 MG tablet Take 1 tablet (150 mg total) by mouth 2 (two) times daily. 180 tablet 2   No current facility-administered medications on file prior to visit.   Allergies  Allergen Reactions  . Albuterol     Throat/lips swelling  . Morphine And Related Other (See Comments)    Very aggressive and doesn't help pain   Family History  Problem Relation Age of Onset  . Colon polyps Father   . Diabetes Father     mother  . Heart disease Father   . Heart murmur Mother   . Diabetes Mother   . Hypertension Mother   . Diabetes Brother   . Cancer Maternal Grandmother   . Diabetes Paternal Grandmother   .  Heart disease Paternal Grandmother   . COPD Paternal Grandfather   . Heart disease Paternal Grandfather   . Hypertension Brother   . Hypertension Brother    Social History   Social History  . Marital Status: Married    Spouse Name: N/A  . Number of Children: 0  . Years of Education: N/A   Occupational History  . retired    Social History Main Topics  . Smoking status: Current Every Day Smoker -- 1.00 packs/day for 40 years    Types: Cigarettes  . Smokeless tobacco: Never Used  . Alcohol Use: No  . Drug Use: No  . Sexual Activity: Not Asked   Other Topics Concern  . None   Social History Narrative   Exercise walking daily (varies)   Depression screen [facility] 2/9 09/05/2014  Decreased Interest 0  Down, Depressed, Hopeless 0  PHQ - 2 Score 0      Review of Systems  Constitutional: Positive for fatigue. Negative for fever, chills,  activity change, appetite change and unexpected weight change.  Musculoskeletal: Positive for back pain and arthralgias. Negative for joint swelling and gait problem.  Skin: Negative for color change and rash.  Neurological: Negative for weakness and numbness.  Psychiatric/Behavioral: Positive for sleep disturbance. Negative for dysphoric mood. The patient is not nervous/anxious.        Objective:  BP 143/75 mmHg  Pulse 103  Temp(Src) 98.9 F (37.2 C)  Resp 16  Ht 5\' 5"  (1.651 m)  Wt 226 lb (102.513 kg)  BMI 37.61 kg/m2  Physical Exam  Constitutional: She is oriented to person, place, and time. She appears well-developed and well-nourished. No distress.  HENT:  Head: Normocephalic and atraumatic.  Right Ear: External ear normal.  Left Ear: External ear normal.  Eyes: Conjunctivae are normal. No scleral icterus.  Neck: Normal range of motion. Neck supple. No thyromegaly present.  Cardiovascular: Normal rate, regular rhythm, normal heart sounds and intact distal pulses.   Pulmonary/Chest: Effort normal and breath sounds normal. No respiratory distress.  Musculoskeletal: She exhibits no edema.  Lymphadenopathy:    She has no cervical adenopathy.  Neurological: She is alert and oriented to person, place, and time.  Skin: Skin is warm and dry. She is not diaphoretic. No erythema.  Psychiatric: She has a normal mood and affect. Her behavior is normal.          Assessment & Plan:   1. Need for shingles vaccine   2. Type 2 diabetes mellitus without complication, without long-term current use of insulin (HCC) - cont metformin 1000mg  bid and glimepiride 2 bid. a1c 7.1 today  3. Angioedema of lips, initial encounter - none currently but h/o so rec stopping benazepril and start norvasc 10.  4. Essential hypertension, benign   5.  H/o tobacco use w/ COPD - pt is a candidate for a lung cancer screening CT - discuss at f/u OV in 4 mos. Cont symbicort. 6.     Chronic pain syndrome -  refilled long-standing meds - has been on same dose with excellent compliance for > 4 yrs. F/u OV in 4 mos for additional refills. 7.    HPL - on lovastatin 40, check flp at next OV in 4 mos as last checked what will be 1 yr prior  Orders Placed This Encounter  Procedures  . Microalbumin, urine  . Comprehensive metabolic panel  . Hemoglobin A1c    Meds ordered this encounter  Medications  . zoster vaccine live, PF, (  ZOSTAVAX) 16109 UNT/0.65ML injection    Sig: Inject 19,400 Units into the skin once.    Dispense:  1 each    Refill:  0  . HYDROcodone-acetaminophen (NORCO) 7.5-325 MG tablet    Sig: Take 1 tablet by mouth every 6 (six) hours as needed for moderate pain. May fill on or after 90 days from date written    Dispense:  90 tablet    Refill:  0  . HYDROcodone-acetaminophen (NORCO) 7.5-325 MG tablet    Sig: Take 1 tablet by mouth every 6 (six) hours as needed for moderate pain.    Dispense:  90 tablet    Refill:  0  . HYDROcodone-acetaminophen (NORCO) 7.5-325 MG tablet    Sig: Take 1 tablet by mouth every 6 (six) hours as needed for moderate pain. May fill on or after 30 days from date written    Dispense:  90 tablet    Refill:  0  . HYDROcodone-acetaminophen (NORCO) 7.5-325 MG tablet    Sig: Take 1 tablet by mouth every 6 (six) hours as needed for moderate pain. May fill on or after 60 days from date written    Dispense:  90 tablet    Refill:  0  . traMADol (ULTRAM) 50 MG tablet    Sig: TAKE TWO TABLETS BY MOUTH 4 TIMES DAILY.   MAXIMUM  DOSE  -  8  TABLETS  PER  DAY    Dispense:  240 tablet    Refill:  2  . diazepam (VALIUM) 5 MG tablet    Sig: TAKE ONE TABLET BY MOUTH AT BEDTIME AND  MAY  REPEAT  DOSE  ONE  TIME  IF  NEEDED    Dispense:  60 tablet    Refill:  3  . cyclobenzaprine (FLEXERIL) 10 MG tablet    Sig: Take 1 tablet (10 mg total) by mouth 3 (three) times daily as needed for muscle spasms.    Dispense:  90 tablet    Refill:  2  . amLODipine (NORVASC) 10 MG  tablet    Sig: Take 1 tablet (10 mg total) by mouth daily.    Dispense:  30 tablet    Refill:  3    Norberto Sorenson, MD MPH  Results for orders placed or performed in visit on 05/14/15  Microalbumin, urine  Result Value Ref Range   Microalb, Ur 5.2 Not estab mg/dL  Comprehensive metabolic panel  Result Value Ref Range   Sodium 141 135 - 146 mmol/L   Potassium 5.4 (H) 3.5 - 5.3 mmol/L   Chloride 104 98 - 110 mmol/L   CO2 27 20 - 31 mmol/L   Glucose, Bld 128 (H) 65 - 99 mg/dL   BUN 16 7 - 25 mg/dL   Creat 6.04 5.40 - 9.81 mg/dL   Total Bilirubin 0.3 0.2 - 1.2 mg/dL   Alkaline Phosphatase 72 33 - 130 U/L   AST 11 10 - 35 U/L   ALT 4 (L) 6 - 29 U/L   Total Protein 6.7 6.1 - 8.1 g/dL   Albumin 4.2 3.6 - 5.1 g/dL   Calcium 9.6 8.6 - 19.1 mg/dL  Hemoglobin Y7W  Result Value Ref Range   Hgb A1c MFr Bld 7.1 (H) <5.7 %   Mean Plasma Glucose 157 (H) <117 mg/dL

## 2015-08-04 ENCOUNTER — Other Ambulatory Visit: Payer: Self-pay | Admitting: Family Medicine

## 2015-09-01 ENCOUNTER — Other Ambulatory Visit: Payer: Self-pay

## 2015-09-01 NOTE — Telephone Encounter (Signed)
Pt is needing a refill on tramadol and hydrocodone   Best number [phone]

## 2015-09-02 NOTE — Telephone Encounter (Signed)
Pended for two more months for review.

## 2015-09-04 MED ORDER — HYDROCODONE-ACETAMINOPHEN 7.5-325 MG PO TABS: 1.0000 | ORAL_TABLET | Freq: Four times a day (QID) | ORAL | Status: DC | PRN

## 2015-09-04 MED ORDER — TRAMADOL HCL 50 MG PO TABS: ORAL_TABLET | ORAL | Status: DC

## 2015-09-04 NOTE — Telephone Encounter (Signed)
Notified pt ready. 

## 2015-09-10 ENCOUNTER — Ambulatory Visit: Payer: PPO | Admitting: Family Medicine

## 2015-09-10 ENCOUNTER — Other Ambulatory Visit: Payer: Self-pay | Admitting: Family Medicine

## 2015-10-01 ENCOUNTER — Ambulatory Visit (INDEPENDENT_AMBULATORY_CARE_PROVIDER_SITE_OTHER): Payer: PPO | Admitting: Family Medicine

## 2015-10-01 ENCOUNTER — Encounter: Payer: Self-pay | Admitting: Family Medicine

## 2015-10-01 VITALS — BP 140/85 | HR 106 | Temp 98.5°F | Resp 16 | Ht 66.0 in | Wt 227.4 lb

## 2015-10-01 DIAGNOSIS — E785 Hyperlipidemia, unspecified: Secondary | ICD-10-CM

## 2015-10-01 DIAGNOSIS — E1169 Type 2 diabetes mellitus with other specified complication: Secondary | ICD-10-CM | POA: Insufficient documentation

## 2015-10-01 DIAGNOSIS — J449 Chronic obstructive pulmonary disease, unspecified: Secondary | ICD-10-CM | POA: Diagnosis not present

## 2015-10-01 DIAGNOSIS — M1990 Unspecified osteoarthritis, unspecified site: Secondary | ICD-10-CM | POA: Diagnosis not present

## 2015-10-01 DIAGNOSIS — I10 Essential (primary) hypertension: Secondary | ICD-10-CM

## 2015-10-01 DIAGNOSIS — D509 Iron deficiency anemia, unspecified: Secondary | ICD-10-CM

## 2015-10-01 DIAGNOSIS — G894 Chronic pain syndrome: Secondary | ICD-10-CM | POA: Diagnosis not present

## 2015-10-01 DIAGNOSIS — Z636 Dependent relative needing care at home: Secondary | ICD-10-CM | POA: Diagnosis not present

## 2015-10-01 DIAGNOSIS — E119 Type 2 diabetes mellitus without complications: Secondary | ICD-10-CM

## 2015-10-01 DIAGNOSIS — Z5181 Encounter for therapeutic drug level monitoring: Secondary | ICD-10-CM | POA: Diagnosis not present

## 2015-10-01 DIAGNOSIS — F17200 Nicotine dependence, unspecified, uncomplicated: Secondary | ICD-10-CM

## 2015-10-01 LAB — COMPREHENSIVE METABOLIC PANEL
ALBUMIN: 4 g/dL (ref 3.6–5.1)
ALT: 4 U/L — ABNORMAL LOW (ref 6–29)
AST: 11 U/L (ref 10–35)
Alkaline Phosphatase: 88 U/L (ref 33–130)
BUN: 15 mg/dL (ref 7–25)
CHLORIDE: 102 mmol/L (ref 98–110)
CO2: 24 mmol/L (ref 20–31)
Calcium: 9.3 mg/dL (ref 8.6–10.4)
Creat: 1.03 mg/dL (ref 0.50–1.05)
Glucose, Bld: 139 mg/dL — ABNORMAL HIGH (ref 65–99)
POTASSIUM: 5.3 mmol/L (ref 3.5–5.3)
Sodium: 140 mmol/L (ref 135–146)
TOTAL PROTEIN: 6.9 g/dL (ref 6.1–8.1)
Total Bilirubin: 0.4 mg/dL (ref 0.2–1.2)

## 2015-10-01 LAB — CBC
HEMATOCRIT: 34.9 % — AB (ref 35.0–45.0)
HEMOGLOBIN: 10.7 g/dL — AB (ref 11.7–15.5)
MCH: 23.9 pg — ABNORMAL LOW (ref 27.0–33.0)
MCHC: 30.7 g/dL — ABNORMAL LOW (ref 32.0–36.0)
MCV: 77.9 fL — AB (ref 80.0–100.0)
MPV: 9 fL (ref 7.5–12.5)
Platelets: 375 10*3/uL (ref 140–400)
RBC: 4.48 MIL/uL (ref 3.80–5.10)
RDW: 15.2 % — AB (ref 11.0–15.0)
WBC: 9.9 10*3/uL (ref 3.8–10.8)

## 2015-10-01 LAB — LIPID PANEL
CHOL/HDL RATIO: 4 ratio (ref <=5.0)
CHOLESTEROL: 148 mg/dL (ref 125–200)
HDL: 37 mg/dL — AB (ref >=46)
LDL Cholesterol: 87 mg/dL (ref <130)
TRIGLYCERIDES: 118 mg/dL (ref <150)
VLDL: 24 mg/dL (ref <30)

## 2015-10-01 LAB — FERRITIN: Ferritin: 14 ng/mL (ref 10–232)

## 2015-10-01 LAB — POCT GLYCOSYLATED HEMOGLOBIN (HGB A1C): Hemoglobin A1C: 6.9

## 2015-10-01 MED ORDER — CYCLOBENZAPRINE HCL 10 MG PO TABS: ORAL_TABLET | ORAL | Status: DC

## 2015-10-01 MED ORDER — HYDROCODONE-ACETAMINOPHEN 7.5-325 MG PO TABS: 1.0000 | ORAL_TABLET | Freq: Four times a day (QID) | ORAL | Status: DC | PRN

## 2015-10-01 MED ORDER — BUDESONIDE-FORMOTEROL FUMARATE 160-4.5 MCG/ACT IN AERO: 1.0000 | INHALATION_SPRAY | Freq: Two times a day (BID) | RESPIRATORY_TRACT | Status: DC

## 2015-10-01 MED ORDER — TRAMADOL HCL 50 MG PO TABS: ORAL_TABLET | ORAL | Status: DC

## 2015-10-01 MED ORDER — DIAZEPAM 5 MG PO TABS: ORAL_TABLET | ORAL | Status: DC

## 2015-10-01 MED ORDER — CLOTRIMAZOLE-BETAMETHASONE 1-0.05 % EX CREA: 1.0000 "application " | TOPICAL_CREAM | Freq: Two times a day (BID) | CUTANEOUS | Status: DC

## 2015-10-01 MED ORDER — GABAPENTIN 300 MG PO CAPS: 300.0000 mg | ORAL_CAPSULE | Freq: Three times a day (TID) | ORAL | Status: DC

## 2015-10-01 MED ORDER — LOSARTAN POTASSIUM-HCTZ 100-25 MG PO TABS: 1.0000 | ORAL_TABLET | Freq: Every day | ORAL | Status: DC

## 2015-10-01 NOTE — Patient Instructions (Signed)
[facility] is now offering annual lung cancer screening by low-dose CT scan.  This is covered for qualifying patients and your insurance will be checked before the procedure.  Call the lung cancer screening nurse navigators at [phone] to learn more about this and get scheduled.  
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
## 2015-10-23 ENCOUNTER — Telehealth (HOSPITAL_COMMUNITY): Payer: Self-pay | Admitting: Emergency Medicine

## 2015-10-23 ENCOUNTER — Ambulatory Visit (HOSPITAL_COMMUNITY): Payer: PPO

## 2015-10-23 ENCOUNTER — Ambulatory Visit (HOSPITAL_COMMUNITY)
Admission: EM | Admit: 2015-10-23 | Discharge: 2015-10-23 | Disposition: A | Discharge status: Home / Self Care | Payer: PPO | Attending: Emergency Medicine | Admitting: Emergency Medicine

## 2015-10-23 ENCOUNTER — Encounter (HOSPITAL_COMMUNITY): Payer: Self-pay | Admitting: Emergency Medicine

## 2015-10-23 DIAGNOSIS — K219 Gastro-esophageal reflux disease without esophagitis: Secondary | ICD-10-CM | POA: Insufficient documentation

## 2015-10-23 DIAGNOSIS — R109 Unspecified abdominal pain: Secondary | ICD-10-CM | POA: Insufficient documentation

## 2015-10-23 DIAGNOSIS — Z7982 Long term (current) use of aspirin: Secondary | ICD-10-CM | POA: Diagnosis not present

## 2015-10-23 DIAGNOSIS — I10 Essential (primary) hypertension: Secondary | ICD-10-CM | POA: Insufficient documentation

## 2015-10-23 DIAGNOSIS — R140 Abdominal distension (gaseous): Secondary | ICD-10-CM | POA: Diagnosis not present

## 2015-10-23 DIAGNOSIS — F17210 Nicotine dependence, cigarettes, uncomplicated: Secondary | ICD-10-CM | POA: Diagnosis not present

## 2015-10-23 DIAGNOSIS — E119 Type 2 diabetes mellitus without complications: Secondary | ICD-10-CM | POA: Diagnosis not present

## 2015-10-23 DIAGNOSIS — R110 Nausea: Secondary | ICD-10-CM | POA: Diagnosis not present

## 2015-10-23 DIAGNOSIS — K6389 Other specified diseases of intestine: Secondary | ICD-10-CM | POA: Diagnosis not present

## 2015-10-23 LAB — POCT H PYLORI SCREEN: H. PYLORI SCREEN, POC: NEGATIVE

## 2015-10-23 MED ORDER — METOCLOPRAMIDE HCL 10 MG PO TABS: 10.0000 mg | ORAL_TABLET | Freq: Two times a day (BID) | ORAL | Status: DC

## 2015-10-23 MED ORDER — PEG 3350-KCL-NA BICARB-NACL 420 G PO SOLR: 4000.0000 mL | Freq: Once | ORAL | Status: DC

## 2015-10-23 NOTE — ED Notes (Signed)
Patient transported to X-ray by [facility] shuttle 

## 2015-10-23 NOTE — ED Notes (Signed)
Patient returned from Xray by [facility] shuttle....placed in Room 9.

## 2015-10-23 NOTE — ED Provider Notes (Signed)
HPI  SUBJECTIVE:  Kaitlin Cantrell is a 60 y.o. female who presents with nausea for the past 5 days, abdominal bloating and intermittent, alternating upper and lower abdominal pain that lasts several minutes. She describes this pain as pressure and occasionally sharp and stabbing. She reports shortness of breath with lying down only. Denies any shortness of breath. She states that she has had identical symptoms before with IBS flareups. Symptoms are better with belching, an anti-gas medication, worse with eating and drinking. She has been taking an anti-gas medicine, Zofran, Pepto Bismol. She denies vomiting, fevers, chest pain, dyspnea on exertion, coughing, wheezing. She states her last bowel movement was 3 days ago. She reports decreased by mouth intake, but is tolerating by mouth. She denies anorexia,  abdominal distention, melena, hematochezia, diarrhea, urinary complaints, back pain. No antipyretic in the past 6-8 hours. No syncope. She has a past medical history of hiatal hernia, IBS, abdominal surgery status post umbilical hernia repair, diabetes, hypertension, hypercholesterolemia, chronic pain for which she is on chronic narcotics. Also has a history of GERD, COPD. No history of mesenteric ischemia, atrial fibrillation, CHF, diverticulitis, diverticulosis, hypercoagulability, H. pylori disease, gastroparesis. She was found to have pre-ulcerative colitis on a colonoscopy several years ago which was thought to be secondary to NSAID use. PMD: Dr. Norberto Sorenson.    Past Medical History  Diagnosis Date  . Allergy     seasonal  . Arthritis   . Diabetes mellitus   . GERD (gastroesophageal reflux disease)   . Hyperlipidemia   . Hypertension   . History of pneumonia     15 years ago    Past Surgical History  Procedure Laterality Date  . Ankle fracture surgery  1974    left and pinned then pins removed  . Ankle ganglion cyst excision  1994    right  . Back surgery  1998    discectomy and  then fusion  . Umbilical hernia repair  1996  . Tubal ligation  1996  . Tonsillectomy  1972  . Knee arthroplasty  02/14/2012    Procedure: COMPUTER ASSISTED TOTAL KNEE ARTHROPLASTY;  Surgeon: Cammy Copa, MD;  Location: [facility];  Service: Orthopedics;  Laterality: Left;  Left total knee arthroplasty    Family History  Problem Relation Age of Onset  . Colon polyps Father   . Diabetes Father     mother  . Heart disease Father   . Heart murmur Mother   . Diabetes Mother   . Hypertension Mother   . Diabetes Brother   . Cancer Maternal Grandmother   . Diabetes Paternal Grandmother   . Heart disease Paternal Grandmother   . COPD Paternal Grandfather   . Heart disease Paternal Grandfather   . Hypertension Brother   . Hypertension Brother     Social History  Substance Use Topics  . Smoking status: Current Every Day Smoker -- 1.00 packs/day for 40 years    Types: Cigarettes  . Smokeless tobacco: Never Used  . Alcohol Use: No    No current facility-administered medications for this encounter.  Current outpatient prescriptions:  .  benazepril (LOTENSIN) 20 MG tablet, Take 20 mg by mouth daily., Disp: , Rfl:  .  glimepiride (AMARYL) 2 MG tablet, TAKE ONE TABLET BY MOUTH TWICE DAILY, Disp: 180 tablet, Rfl: 2 .  lovastatin (MEVACOR) 20 MG tablet, Take 2 tablets (40 mg total) by mouth daily at 6 PM., Disp: 180 tablet, Rfl: 3 .  metFORMIN (GLUCOPHAGE) 1000 MG  tablet, TAKE ONE TABLET BY MOUTH TWICE DAILY WITH MEALS, Disp: 180 tablet, Rfl: 3 .  aspirin EC 81 MG tablet, Take 81 mg by mouth daily., Disp: , Rfl:  .  azelastine (OPTIVAR) 0.05 % ophthalmic solution, Place 1 drop into both eyes 2 (two) times daily., Disp: 6 mL, Rfl: 12 .  budesonide-formoterol (SYMBICORT) 160-4.5 MCG/ACT inhaler, Inhale 1 puff into the lungs 2 (two) times daily., Disp: 1 Inhaler, Rfl: 12 .  clotrimazole-betamethasone (LOTRISONE) cream, Apply 1 application topically 2 (two) times daily., Disp: 45 g, Rfl:  2 .  cyclobenzaprine (FLEXERIL) 10 MG tablet, TAKE ONE TABLET BY MOUTH THREE TIMES DAILY AS NEEDED FOR MUSCLE SPASMS, Disp: 270 tablet, Rfl: 1 .  diazepam (VALIUM) 5 MG tablet, TAKE ONE TABLET BY MOUTH AT BEDTIME AND  MAY  REPEAT  DOSE  ONE  TIME  IF  NEEDED, Disp: 180 tablet, Rfl: 1 .  gabapentin (NEURONTIN) 300 MG capsule, Take 1 capsule (300 mg total) by mouth 3 (three) times daily. Titrate up slowely, Disp: 90 capsule, Rfl: 3 .  Krill Oil CAPS, Take 1 capsule by mouth 2 (two) times daily. , Disp: , Rfl:  .  losartan-hydrochlorothiazide (HYZAAR) 100-25 MG tablet, Take 1 tablet by mouth daily., Disp: 90 tablet, Rfl: 1 .  metoCLOPramide (REGLAN) 10 MG tablet, Take 1 tablet (10 mg total) by mouth 2 (two) times daily., Disp: 10 tablet, Rfl: 0 .  omeprazole (PRILOSEC) 20 MG capsule, Take 1 capsule (20 mg total) by mouth daily., Disp: 1 capsule, Rfl: 0 .  polyethylene glycol-electrolytes (NULYTELY/GOLYTELY) 420 g solution, Take 4,000 mLs by mouth once., Disp: 4000 mL, Rfl: 0 .  ranitidine (ZANTAC) 150 MG tablet, TAKE ONE TABLET BY MOUTH TWICE DAILY., Disp: 180 tablet, Rfl: 0  Allergies  Allergen Reactions  . Albuterol     Throat/lips swelling  . Morphine And Related Other (See Comments)    Very aggressive and doesn't help pain     ROS  As noted in HPI.   Physical Exam  BP 150/85 mmHg  Pulse 112  Temp(Src) 98.9 F (37.2 C) (Oral)  SpO2 97%  Constitutional: Well developed, well nourished, no acute distress Eyes: PERRL, EOMI, conjunctiva normal bilaterally HENT: Normocephalic, atraumatic,mucus membranes moist Respiratory: Clear to auscultation bilaterally, no rales, no wheezing, no rhonchi Cardiovascular: Normal rate and rhythm, no murmurs, no gallops, no rubs GI: obese. Normal appearance, no umbilical hernias, Soft, nondistended, normal bowel sounds, nontender, no rebound, no guarding.  GU: No inguinal hernias. Back: no CVAT skin: No rash, skin intact Musculoskeletal: No edema, no  tenderness, no deformities Neurologic: Alert & oriented x 3, CN II-XII grossly intact, no motor deficits, sensation grossly intact Psychiatric: Speech and behavior appropriate   ED Course   Medications - No data to display  Orders Placed This Encounter  Procedures  . DG Abd Acute W/Chest    Standing Status: Standing     Number of Occurrences: 1     Standing Expiration Date:     Order Specific Question:  Reason for Exam (SYMPTOM  OR DIAGNOSIS REQUIRED)    Answer:  r/o obstruction, free air  . H.pylori screen, POC    Standing Status: Standing     Number of Occurrences: 1     Standing Expiration Date:    Results for orders placed or performed during the hospital encounter of 10/23/15 (from the past 24 hour(s))  H.pylori screen, POC     Status: None   Collection Time: 10/23/15  4:14 PM  Result Value Ref Range   H. PYLORI SCREEN, POC NEGATIVE NEGATIVE   Dg Abd Acute W/chest  10/23/2015  CLINICAL DATA:  Bloating with nausea and vomiting +abdominal pain for 5 days. EXAM: DG ABDOMEN ACUTE W/ 1V CHEST COMPARISON:  06/08/2012. FINDINGS: The lungs are clear wiithout focal pneumonia, edema, pneumothorax or pleural effusion. Interstitial markings are diffusely coarsened with chronic features. The cardiopericardial silhouette is within normal limits for size. Upright film shows no evidence for intraperitoneal free air. Supine film shows diffuse gaseous small bowel distention without evidence for overt obstruction. No gaseous dilatation of the colon. Convex rightward lumbar scoliosis again noted with interbody cages at L4-5 bones are diffusely demineralized. IMPRESSION: 1. No acute cardiopulmonary findings. 2. No evidence for bowel perforation. No overt bowel obstruction at this time. Diffuse gaseous bowel distention may be related to a component of underlying ileus. Electronically Signed   By: Kennith Center M.D.   On: 10/23/2015 16:50    ED Clinical Impression  Abdominal pain, unspecified  abdominal location  Abdominal bloating   ED Assessment/Plan  Reviewed labs, imaging independently. No evidence of obstruction. Diffuse gaseous bowel distention may be related to underlying ileus per radiology. See radiology report for full details.  H. pylori negative.  No evidence  of obstruction, perforation, surgical abdomen. Mildly tachycardic, but afebrile. Vitals otherwise acceptable. We'll send home with GoLYTELY, probiotics. Also Reglan if these do not work- as a Occupational hygienist and also for the nausea.  Discussed labs, imaging, MDM, plan and followup with patient  Discussed sn/sx that should prompt return to the ED. Patient agrees with plan.   *This clinic note was created using Dragon dictation software. Therefore, there may be occasional mistakes despite careful proofreading.  ?   Domenick Gong, MD 10/23/15 2109

## 2015-10-23 NOTE — ED Notes (Signed)
Patient was advised that the prescription has now been e-scribed to her pharmacy above.

## 2015-10-23 NOTE — Discharge Instructions (Signed)
Try the golytely first. You may also try a fleet enema. Also start some probiotics. Culturelle is good. Try the reglan if these do not work. Go to the ER if your pain changes, becomes more intense, occurs for longer period of time, if your Becomes Distended and Hard, chest pain, shortness of breath, fevers, or other concerns.

## 2015-10-23 NOTE — ED Notes (Signed)
The patient presented to the [facility] with a complaint of abdominal bloating and nausea. The patient stated that she ate dinner 5 days ago and after started to feel bloated. She further stated that she has a hx of IBS and hiatal hernia and that this happens on occasion but usually clears up in 30 minutes to an hour but has not this time. The patient denied any emesis.

## 2015-10-30 DIAGNOSIS — J189 Pneumonia, unspecified organism: Secondary | ICD-10-CM | POA: Diagnosis not present

## 2015-11-16 ENCOUNTER — Ambulatory Visit (INDEPENDENT_AMBULATORY_CARE_PROVIDER_SITE_OTHER): Payer: PPO

## 2015-11-16 ENCOUNTER — Ambulatory Visit (INDEPENDENT_AMBULATORY_CARE_PROVIDER_SITE_OTHER): Payer: PPO | Admitting: Family Medicine

## 2015-11-16 VITALS — BP 132/88 | HR 122 | Temp 98.3°F | Resp 16 | Ht 66.0 in | Wt 222.0 lb

## 2015-11-16 DIAGNOSIS — J441 Chronic obstructive pulmonary disease with (acute) exacerbation: Secondary | ICD-10-CM | POA: Diagnosis not present

## 2015-11-16 DIAGNOSIS — F17200 Nicotine dependence, unspecified, uncomplicated: Secondary | ICD-10-CM | POA: Diagnosis not present

## 2015-11-16 DIAGNOSIS — E119 Type 2 diabetes mellitus without complications: Secondary | ICD-10-CM

## 2015-11-16 DIAGNOSIS — R059 Cough, unspecified: Secondary | ICD-10-CM

## 2015-11-16 DIAGNOSIS — R05 Cough: Secondary | ICD-10-CM

## 2015-11-16 DIAGNOSIS — R000 Tachycardia, unspecified: Secondary | ICD-10-CM

## 2015-11-16 LAB — POCT CBC
Granulocyte percent: 65.1 %G (ref 37–80)
HEMATOCRIT: 33.2 % — AB (ref 37.7–47.9)
Hemoglobin: 10.8 g/dL — AB (ref 12.2–16.2)
LYMPH, POC: 2.9 (ref 0.6–3.4)
MCH: 23.3 pg — AB (ref 27–31.2)
MCHC: 32.5 g/dL (ref 31.8–35.4)
MCV: 71.8 fL — AB (ref 80–97)
MID (CBC): 0.6 (ref 0–0.9)
MPV: 6.6 fL (ref 0–99.8)
POC GRANULOCYTE: 6.5 (ref 2–6.9)
POC LYMPH PERCENT: 29.3 %L (ref 10–50)
POC MID %: 5.6 % (ref 0–12)
Platelet Count, POC: 332 10*3/uL (ref 142–424)
RBC: 4.63 M/uL (ref 4.04–5.48)
RDW, POC: 16.9 %
WBC: 10 10*3/uL (ref 4.6–10.2)

## 2015-11-16 LAB — GLUCOSE, POCT (MANUAL RESULT ENTRY): POC Glucose: 151 mg/dl — AB (ref 70–99)

## 2015-11-16 MED ORDER — METHYLPREDNISOLONE ACETATE 80 MG/ML IJ SUSP
40.0000 mg | Freq: Once | INTRAMUSCULAR | Status: AC
  Administered 2015-11-16: 40 mg via INTRAMUSCULAR

## 2015-11-16 MED ORDER — AZITHROMYCIN 500 MG PO TABS: 500.0000 mg | ORAL_TABLET | Freq: Every day | ORAL | Status: DC

## 2015-11-16 MED ORDER — GUAIFENESIN ER 1200 MG PO TB12: 1.0000 | ORAL_TABLET | Freq: Two times a day (BID) | ORAL | Status: DC | PRN

## 2015-11-16 MED ORDER — IPRATROPIUM BROMIDE 0.02 % IN SOLN
0.5000 mg | Freq: Once | RESPIRATORY_TRACT | Status: AC
  Administered 2015-11-16: 0.5 mg via RESPIRATORY_TRACT

## 2015-11-16 MED ORDER — IPRATROPIUM BROMIDE HFA 17 MCG/ACT IN AERS: 2.0000 | INHALATION_SPRAY | Freq: Four times a day (QID) | RESPIRATORY_TRACT | Status: DC

## 2015-11-16 NOTE — Progress Notes (Signed)
Patient ID: Kaitlin Cantrell, female   DOB: [birth year], 58 y.o.   MRN: [id number]  1. Tachycardia   2. Cough   3. COPD exacerbation (HCC) with tob use - no sig improvement in sxs after antibiotic inj x 1 and course of doxy though suspect cxr improved from pt's report of her CXR read at [facility] 2 wks prior.  Try azithro and IM Depo-Medrol 40mg  x 1. RTC if worsening or no sig improvement in 5d.  Start scheduled atrovent qid and cont symbicort bid. Can't use albuterol due to anaphylactic type allergic reaction to it prior but no prob tolerating the long-acting beta-agonist  4.       DMII - increase amaryl to 2 tabs bid for the next 2-3d since was given Depo-Medrol inj today for COPD exac. 5.      Tob use  Orders Placed This Encounter  Procedures  . DG Chest 2 View    Standing Status: Future     Number of Occurrences: 1     Standing Expiration Date: 11/15/2016    Order Specific Question:  Reason for Exam (SYMPTOM  OR DIAGNOSIS REQUIRED)    Answer:  cough    Order Specific Question:  Is the patient pregnant?    Answer:  No    Order Specific Question:  Preferred imaging location?    Answer:  External  . POCT CBC  . POCT glucose (manual entry)    Meds ordered this encounter  Medications  . ipratropium (ATROVENT) nebulizer solution 0.5 mg    Sig:   . methylPREDNISolone acetate (DEPO-MEDROL) injection 40 mg    Sig:   . ipratropium (ATROVENT HFA) 17 MCG/ACT inhaler    Sig: Inhale 2 puffs into the lungs 4 (four) times daily.    Dispense:  1 Inhaler    Refill:  12  . azithromycin (ZITHROMAX) 500 MG tablet    Sig: Take 1 tablet (500 mg total) by mouth daily.    Dispense:  3 tablet    Refill:  0  . Guaifenesin (MUCINEX MAXIMUM STRENGTH) 1200 MG TB12    Sig: Take 1 tablet (1,200 mg total) by mouth every 12 (twelve) hours as needed.    Dispense:  14 tablet    Refill:  1      Norberto Sorenson, M.D.  [facility] [address] [place], [place] [address] [phone]  phone [phone] fax  11/16/2015 11:00 PM

## 2015-11-16 NOTE — Patient Instructions (Addendum)
IF you received an x-ray today, you will receive an invoice from [facility]. Please contact [facility] at [phone] with questions or concerns regarding your invoice.   IF you received labwork today, you will receive an invoice from [employer]. Please contact [facility] at [phone] with questions or concerns regarding your invoice.   Our billing staff will not be able to assist you with questions regarding bills from these companies.  You will be contacted with the lab results as soon as they are available. The fastest way to get your results is to activate your My Chart account. Instructions are located on the last page of this paperwork. If you have not heard from [place] regarding the results in 2 weeks, please contact this office.    Take 2 tabs of the amaryl with breakfast and 2 tabs with supper for now through Wed. On  Thursday go back to one twice a day.  Chronic Obstructive Pulmonary Disease Exacerbation Chronic obstructive pulmonary disease (COPD) is a common lung condition in which airflow from the lungs is limited. COPD is a general term that can be used to describe many different lung problems that limit airflow, including chronic bronchitis and emphysema. COPD exacerbations are episodes when breathing symptoms become much worse and require extra treatment. Without treatment, COPD exacerbations can be life threatening, and frequent COPD exacerbations can cause further damage to your lungs. CAUSES  Respiratory infections.  Exposure to smoke.  Exposure to air pollution, chemical fumes, or dust. Sometimes there is no apparent cause or trigger. RISK FACTORS  Smoking cigarettes.  Older age.  Frequent prior COPD exacerbations. SIGNS AND SYMPTOMS  Increased coughing.  Increased thick spit (sputum) production.  Increased wheezing.  Increased shortness of breath.  Rapid breathing.  Chest tightness. DIAGNOSIS Your  medical history, a physical exam, and tests will help your health care provider make a diagnosis. Tests may include:  A chest X-ray.  Basic lab tests.  Sputum testing.  An arterial blood gas test. TREATMENT Depending on the severity of your COPD exacerbation, you may need to be admitted to a hospital for treatment. Some of the treatments commonly used to treat COPD exacerbations are:   Antibiotic medicines.  Bronchodilators. These are drugs that expand the air passages. They may be given with an inhaler or nebulizer. Spacer devices may be needed to help improve drug delivery.  Corticosteroid medicines.  Supplemental oxygen therapy.  Airway clearing techniques, such as noninvasive ventilation (NIV) and positive expiratory pressure (PEP). These provide respiratory support through a mask or other noninvasive device. HOME CARE INSTRUCTIONS  Do not smoke. Quitting smoking is very important to prevent COPD from getting worse and exacerbations from happening as often.  Avoid exposure to all substances that irritate the airway, especially to tobacco smoke.  If you were prescribed an antibiotic medicine, finish it all even if you start to feel better.  Take all medicines as directed by your health care provider.It is important to use correct technique with inhaled medicines.  Drink enough fluids to keep your urine clear or pale yellow (unless you have a medical condition that requires fluid restriction).  Use a cool mist vaporizer. This makes it easier to clear your chest when you cough.  If you have a home nebulizer and oxygen, continue to use them as directed.  Maintain all necessary vaccinations to prevent infections.  Exercise regularly.  Eat a healthy diet.  Keep all follow-up appointments as directed by your health care  provider. SEEK IMMEDIATE MEDICAL CARE IF:  You have worsening shortness of breath.  You have trouble talking.  You have severe chest pain.  You have  blood in your sputum.  You have a fever.  You have weakness, vomit repeatedly, or faint.  You feel confused.  You continue to get worse. MAKE SURE YOU:  Understand these instructions.  Will watch your condition.  Will get help right away if you are not doing well or get worse.   This information is not intended to replace advice given to you by your health care provider. Make sure you discuss any questions you have with your health care provider.   Document Released: 02/13/2007 Document Revised: 05/09/2014 Document Reviewed: 12/21/2012 Elsevier Interactive Patient Education [employer]

## 2015-11-16 NOTE — Progress Notes (Signed)
WILHEMINA GRALL  MRN: [id number] DOB: 10-11-1955  Subjective:  Kaitlin Cantrell is a 60 y.o. female seen in office today for a chief complaint of follow up on pnuemonia. Was seen in [place] at [facility] eleven days ago for cough and SOB. Per pt, xray showed pneumonia in right lobe. Was given injection in the clinic (pt unsure what was injected) and treated with 10 days of doxycycline.   Today, pt states she only feels a little better but does not think the infection has cleared because she is feeling extremely fatigued. Took antibiotics as prescribed. Has associated shortness of breath (different than her typical SOB associated with COPD), cough (sometimes productive, sometimes non productive), and congestion. Denies nausea, vomiting, and diarrhea.   Review of Systems  Constitutional: Positive for chills and diaphoresis.  HENT: Negative for sneezing and sore throat.   Eyes: Negative for itching.  Respiratory: Positive for chest tightness and wheezing. Negative for choking.   Cardiovascular: Negative for chest pain and palpitations.  Gastrointestinal: Positive for abdominal pain.  Skin: Negative for rash.  Neurological: Positive for light-headedness. Negative for syncope.    Patient Active Problem List   Diagnosis Date Noted  . Arthritis 10/01/2015  . Hyperlipidemia 10/01/2015  . Chronic pain syndrome 10/01/2015  . Caregiver stress 10/01/2015  . Chronic obstructive pulmonary disease (HCC) 10/01/2015  . Tobacco use disorder 10/01/2015  . Diabetes mellitus, type II (HCC) 03/30/2012  . Polypharmacy 03/30/2012  . Osteoarthritis of left knee 02/17/2012    Class: Diagnosis of  . HTN (hypertension) 05/10/2011    Current Outpatient Prescriptions on File Prior to Visit  Medication Sig Dispense Refill  . aspirin EC 81 MG tablet Take 81 mg by mouth daily.    Marland Kitchen azelastine (OPTIVAR) 0.05 % ophthalmic solution Place 1 drop into both eyes 2 (two) times daily. 6 mL 12   . budesonide-formoterol (SYMBICORT) 160-4.5 MCG/ACT inhaler Inhale 1 puff into the lungs 2 (two) times daily. 1 Inhaler 12  . clotrimazole-betamethasone (LOTRISONE) cream Apply 1 application topically 2 (two) times daily. 45 g 2  . cyclobenzaprine (FLEXERIL) 10 MG tablet TAKE ONE TABLET BY MOUTH THREE TIMES DAILY AS NEEDED FOR MUSCLE SPASMS 270 tablet 1  . diazepam (VALIUM) 5 MG tablet TAKE ONE TABLET BY MOUTH AT BEDTIME AND  MAY  REPEAT  DOSE  ONE  TIME  IF  NEEDED 180 tablet 1  . gabapentin (NEURONTIN) 300 MG capsule Take 1 capsule (300 mg total) by mouth 3 (three) times daily. Titrate up slowely 90 capsule 3  . glimepiride (AMARYL) 2 MG tablet TAKE ONE TABLET BY MOUTH TWICE DAILY 180 tablet 2  . Krill Oil CAPS Take 1 capsule by mouth 2 (two) times daily.     Marland Kitchen losartan-hydrochlorothiazide (HYZAAR) 100-25 MG tablet Take 1 tablet by mouth daily. 90 tablet 1  . lovastatin (MEVACOR) 20 MG tablet Take 2 tablets (40 mg total) by mouth daily at 6 PM. 180 tablet 3  . metFORMIN (GLUCOPHAGE) 1000 MG tablet TAKE ONE TABLET BY MOUTH TWICE DAILY WITH MEALS 180 tablet 3  . metoCLOPramide (REGLAN) 10 MG tablet Take 1 tablet (10 mg total) by mouth 2 (two) times daily. 10 tablet 0  . omeprazole (PRILOSEC) 20 MG capsule Take 1 capsule (20 mg total) by mouth daily. 1 capsule 0  . polyethylene glycol-electrolytes (NULYTELY/GOLYTELY) 420 g solution Take 4,000 mLs by mouth once. 4000 mL 0  . ranitidine (ZANTAC) 150 MG tablet TAKE ONE TABLET BY  MOUTH TWICE DAILY. 180 tablet 0   No current facility-administered medications on file prior to visit.    Allergies  Allergen Reactions  . Albuterol     Throat/lips swelling  . Morphine And Related Other (See Comments)    Very aggressive and doesn't help pain    Objective:  BP 132/88 mmHg  Pulse 122  Temp(Src) 98.3 F (36.8 C) (Oral)  Resp 16  Ht 5\' 6"  (1.676 m)  Wt 222 lb (100.699 kg)  BMI 35.85 kg/m2  SpO2 96%  Physical Exam  Constitutional: She is  oriented to person, place, and time and well-developed, well-nourished, and in no distress.  HENT:  Head: Normocephalic and atraumatic.  Eyes: Pupils are equal, round, and reactive to light.  Neck: Normal range of motion.  Cardiovascular: Regular rhythm and normal heart sounds.  Tachycardia present.   Pulmonary/Chest: Effort normal. She has wheezes (expiratory, more prominent on right posterior lung field ). She has rhonchi (diffusely through right and left posterior lung field, unable to clear with cough ). She has rales (in RLL).  Lymphadenopathy:       Head (right side): No submental, no submandibular, no tonsillar, no preauricular, no posterior auricular and no occipital adenopathy present.       Head (left side): No submental, no submandibular, no tonsillar, no preauricular, no posterior auricular and no occipital adenopathy present.    She has no cervical adenopathy.       Right: No supraclavicular adenopathy present.       Left: No supraclavicular adenopathy present.  Neurological: She is alert and oriented to person, place, and time. Gait normal.  Skin: Skin is warm and dry.  Psychiatric: Affect normal.   Results for orders placed or performed in visit on 11/16/15 (from the past 24 hour(s))  POCT CBC     Status: Abnormal   Collection Time: 11/16/15  4:35 PM  Result Value Ref Range   WBC 10.0 4.6 - 10.2 K/uL   Lymph, poc 2.9 0.6 - 3.4   POC LYMPH PERCENT 29.3 10 - 50 %L   MID (cbc) 0.6 0 - 0.9   POC MID % 5.6 0 - 12 %M   POC Granulocyte 6.5 2 - 6.9   Granulocyte percent 65.1 37 - 80 %G   RBC 4.63 4.04 - 5.48 M/uL   Hemoglobin 10.8 (A) 12.2 - 16.2 g/dL   HCT, POC 83.1 (A) 51.7 - 47.9 %   MCV 71.8 (A) 80 - 97 fL   MCH, POC 23.3 (A) 27 - 31.2 pg   MCHC 32.5 31.8 - 35.4 g/dL   RDW, POC 61.6 %   Platelet Count, POC 332 142 - 424 K/uL   MPV 6.6 0 - 99.8 fL  POCT glucose (manual entry)     Status: Abnormal   Collection Time: 11/16/15  4:35 PM  Result Value Ref Range   POC  Glucose 151 (A) 70 - 99 mg/dl    Dg Chest 2 View  0/73/7106  CLINICAL DATA:  Cough, long-term smoker, history of diabetes, hyperlipidemia, COPD. EXAM: CHEST  2 VIEW COMPARISON:  Chest x-ray of October 23, 2015 and June 08, 2012. FINDINGS: The lungs remain hyperinflated. Subtle increased density is present lateral to the cardiac apex which appears new since the October 23, 2015 study. The interstitial markings overall are more conspicuous than in 2014, but are not significantly changed since June of 2017. The heart is top-normal in size. The pulmonary vascularity is not engorged. The mediastinum is  normal in width. The bony thorax exhibits no acute abnormality. IMPRESSION: COPD. Atelectasis or early infiltrate lateral to the cardiac apex likely in the left lower lobe but difficult to fully triangulate on the lateral view. Followup PA and lateral chest X-ray is recommended in 3-4 weeks following trial of antibiotic therapy to ensure resolution and exclude underlying malignancy. Electronically Signed   By: David  [place] M.D.   On: 11/16/2015 16:44      Assessment and Plan :  1. Tachycardia  2. Cough - ipratropium (ATROVENT) nebulizer solution 0.5 mg; Take 2.5 mLs (0.5 mg total) by nebulization once. - azithromycin (ZITHROMAX) 500 MG, Take 1 tablet (500 mg total) by mouth daily X 3 days to cover for atypical organisms  - DG Chest 2 View - POCT CBC - POCT glucose (manual entry) 3. COPD exacerbation (HCC) - methylPREDNISolone acetate (DEPO-MEDROL) injection 40 mg; Inject 0.5 mLs (40 mg total) into the muscle once.  -Follow up Saturday (11/21/15) if no improvement in symptoms. Follow up sooner if symptoms worsen.   Benjiman Core PA-C  [facility] 11/16/2015 4:57 PM

## 2015-12-01 DIAGNOSIS — I2699 Other pulmonary embolism without acute cor pulmonale: Secondary | ICD-10-CM

## 2015-12-01 HISTORY — DX: Other pulmonary embolism without acute cor pulmonale: I26.99

## 2015-12-18 ENCOUNTER — Other Ambulatory Visit: Payer: Self-pay | Admitting: Family Medicine

## 2015-12-28 ENCOUNTER — Ambulatory Visit (INDEPENDENT_AMBULATORY_CARE_PROVIDER_SITE_OTHER): Payer: PPO

## 2015-12-28 ENCOUNTER — Ambulatory Visit: Payer: PPO | Admitting: Family Medicine

## 2015-12-28 VITALS — BP 124/80 | HR 117 | Temp 98.1°F | Resp 20 | Ht 66.0 in | Wt 226.4 lb

## 2015-12-28 DIAGNOSIS — J90 Pleural effusion, not elsewhere classified: Secondary | ICD-10-CM

## 2015-12-28 DIAGNOSIS — J449 Chronic obstructive pulmonary disease, unspecified: Secondary | ICD-10-CM | POA: Diagnosis not present

## 2015-12-28 DIAGNOSIS — Z79899 Other long term (current) drug therapy: Secondary | ICD-10-CM | POA: Diagnosis not present

## 2015-12-28 DIAGNOSIS — R5383 Other fatigue: Secondary | ICD-10-CM

## 2015-12-28 DIAGNOSIS — R000 Tachycardia, unspecified: Secondary | ICD-10-CM

## 2015-12-28 DIAGNOSIS — R059 Cough, unspecified: Secondary | ICD-10-CM

## 2015-12-28 DIAGNOSIS — G894 Chronic pain syndrome: Secondary | ICD-10-CM

## 2015-12-28 DIAGNOSIS — R0602 Shortness of breath: Secondary | ICD-10-CM | POA: Diagnosis not present

## 2015-12-28 DIAGNOSIS — R05 Cough: Secondary | ICD-10-CM | POA: Diagnosis not present

## 2015-12-28 DIAGNOSIS — J948 Other specified pleural conditions: Secondary | ICD-10-CM

## 2015-12-28 DIAGNOSIS — J189 Pneumonia, unspecified organism: Secondary | ICD-10-CM | POA: Diagnosis not present

## 2015-12-28 DIAGNOSIS — F17200 Nicotine dependence, unspecified, uncomplicated: Secondary | ICD-10-CM | POA: Diagnosis not present

## 2015-12-28 DIAGNOSIS — I10 Essential (primary) hypertension: Secondary | ICD-10-CM | POA: Diagnosis not present

## 2015-12-28 DIAGNOSIS — R600 Localized edema: Secondary | ICD-10-CM

## 2015-12-28 DIAGNOSIS — E119 Type 2 diabetes mellitus without complications: Secondary | ICD-10-CM

## 2015-12-28 LAB — POCT CBC
Granulocyte percent: 70.5 %G (ref 37–80)
HEMATOCRIT: 33.3 % — AB (ref 37.7–47.9)
Hemoglobin: 10.8 g/dL — AB (ref 12.2–16.2)
LYMPH, POC: 2.5 (ref 0.6–3.4)
MCH: 22.8 pg — AB (ref 27–31.2)
MCHC: 32.4 g/dL (ref 31.8–35.4)
MCV: 70.5 fL — AB (ref 80–97)
MID (CBC): 0.7 (ref 0–0.9)
MPV: 6.8 fL (ref 0–99.8)
PLATELET COUNT, POC: 328 10*3/uL (ref 142–424)
POC Granulocyte: 7.7 — AB (ref 2–6.9)
POC LYMPH %: 23 % (ref 10–50)
POC MID %: 6.5 %M (ref 0–12)
RBC: 4.72 M/uL (ref 4.04–5.48)
RDW, POC: 17.8 %
WBC: 10.9 10*3/uL — AB (ref 4.6–10.2)

## 2015-12-28 LAB — COMPREHENSIVE METABOLIC PANEL
ALK PHOS: 99 U/L (ref 33–130)
ALT: 6 U/L (ref 6–29)
AST: 10 U/L (ref 10–35)
Albumin: 3.6 g/dL (ref 3.6–5.1)
BILIRUBIN TOTAL: 0.5 mg/dL (ref 0.2–1.2)
BUN: 16 mg/dL (ref 7–25)
CO2: 28 mmol/L (ref 20–31)
CREATININE: 1.05 mg/dL (ref 0.50–1.05)
Calcium: 9 mg/dL (ref 8.6–10.4)
Chloride: 99 mmol/L (ref 98–110)
GLUCOSE: 168 mg/dL — AB (ref 65–99)
Potassium: 4.7 mmol/L (ref 3.5–5.3)
Sodium: 137 mmol/L (ref 135–146)
Total Protein: 6.6 g/dL (ref 6.1–8.1)

## 2015-12-28 LAB — D-DIMER, QUANTITATIVE: D-Dimer, Quant: 2.44 mcg/mL FEU — ABNORMAL HIGH (ref <0.50)

## 2015-12-28 LAB — GLUCOSE, POCT (MANUAL RESULT ENTRY): POC GLUCOSE: 196 mg/dL — AB (ref 70–99)

## 2015-12-28 LAB — POCT SEDIMENTATION RATE: POCT SED RATE: 38 mm/h — AB (ref 0–22)

## 2015-12-28 MED ORDER — LEVOFLOXACIN 750 MG PO TABS: 750.0000 mg | ORAL_TABLET | Freq: Every day | ORAL | 0 refills | Status: DC

## 2015-12-28 MED ORDER — CEFTRIAXONE SODIUM 1 G IJ SOLR
1.0000 g | Freq: Once | INTRAMUSCULAR | Status: AC
  Administered 2015-12-28: 1 g via INTRAMUSCULAR

## 2015-12-28 MED ORDER — FUROSEMIDE 40 MG PO TABS: 40.0000 mg | ORAL_TABLET | Freq: Every day | ORAL | 0 refills | Status: DC

## 2015-12-28 MED ORDER — IPRATROPIUM BROMIDE 0.02 % IN SOLN
0.5000 mg | Freq: Once | RESPIRATORY_TRACT | Status: AC
  Administered 2015-12-28: 0.5 mg via RESPIRATORY_TRACT

## 2015-12-28 MED ORDER — POTASSIUM CHLORIDE CRYS ER 20 MEQ PO TBCR: 20.0000 meq | EXTENDED_RELEASE_TABLET | Freq: Every day | ORAL | 0 refills | Status: DC

## 2015-12-28 NOTE — Patient Instructions (Addendum)
Start the levaquin tonight or tomorrow morning.  Tomorrow morning after your labs come back, we will decide what type of CT scan we need to get and get that tomorrow.  Use your atrovent 4 times a day and stay on your symbicort. Start lasix with a potassium once a day to try to mobilize the fluid in your lungs and legs.  IF you received an x-ray today, you will receive an invoice from [facility]. Please contact [facility] at [phone] with questions or concerns regarding your invoice.   IF you received labwork today, you will receive an invoice from [facility]. Please contact [facility] at [phone] with questions or concerns regarding your invoice.   Our billing staff will not be able to assist you with questions regarding bills from these companies.  You will be contacted with the lab results as soon as they are available. The fastest way to get your results is to activate your My Chart account. Instructions are located on the last page of this paperwork. If you have not heard from us regarding the results in 2 weeks, please contact this office.     Pleural Effusion A pleural effusion is an abnormal buildup of fluid in the layers of tissue between your lungs and the inside of your chest (pleural space). These two layers of tissue that line both your lungs and the inside of your chest are called pleura. Usually, there is no air in the space between the pleura, only a thin layer of fluid. If left untreated, a large amount of fluid can build up and cause the lung to collapse. A pleural effusion is usually caused by another disease that requires treatment. The two main types of pleural effusion are:  Transudative pleural effusion. This happens when fluid leaks into the pleural space because of a low protein count in your blood or high blood pressure in your vessels. Heart failure often causes this.  Exudative infusion. This occurs when fluid  collects in the pleural space from blocked blood vessels or lymph vessels. Some lung diseases, injuries, and cancers can cause this type of effusion. CAUSES Pleural effusion can be caused by:  Heart failure.  A blood clot in the lung (pulmonary embolism).  Pneumonia.  Cancer.  Liver failure (cirrhosis).  Kidney disease.  Complications from surgery, such as from open heart surgery. SIGNS AND SYMPTOMS In some cases, pleural effusion may cause no symptoms. Symptoms can include:  Shortness of breath, especially when lying down.  Chest pain, often worse when taking a deep breath.  Fever.  Dry cough that is lasting (chronic).  Hiccups.  Rapid breathing. An underlying condition that is causing the pleural effusion (such as heart failure, pneumonia, blood clots, tuberculosis, or cancer) may also cause additional symptoms. DIAGNOSIS Your health care provider may suspect pleural effusion based on your symptoms and medical history. Your health care provider will also do a physical exam and a chest X-ray. If the X-ray shows there is fluid in your chest, you may need to have this fluid removed using a needle (thoracentesis) so it can be tested. You may also have:  Imaging studies of the chest, such as:  Ultrasound.  CT scan.  Blood tests for kidney and liver function. TREATMENT Treatment depends on the cause of the pleural effusion. Treatment may include:  Taking antibiotic medicines to clear up an infection that is causing the pleural effusion.  Placing a tube in the chest to drain the effusion (tube thoracostomy).  This procedure is often used when there is an infection in the fluid.  Surgery to remove the fibrous outer layer of tissue from the pleural space (decortication).  Thoracentesis, which can improve cough and shortness of breath.  A procedure to put medicine into the chest cavity to seal the pleural space to prevent fluid buildup (pleurodesis).  Chemotherapy and  radiation therapy. These may be required in the case of cancerous (malignant) pleural effusion. HOME CARE INSTRUCTIONS  Take medicines only as directed by your health care provider.  Keep track of how long you can gently exercise before you get short of breath. Try simply walking at first.  Do not use any tobacco products, including cigarettes, chewing tobacco, or electronic cigarettes. If you need help quitting, ask your health care provider.  Keep all follow-up visits as directed by your health care provider. This is important. SEEK MEDICAL CARE IF:  The amount of time that you are able to exercise decreases or does not improve with time.  You have pain or signs of infection at the puncture site if you had thoracentesis. Watch for:  Drainage.  Redness.  Swelling.  You have a fever. SEEK IMMEDIATE MEDICAL CARE IF:  You are short of breath.  You develop chest pain.  You develop a new cough. MAKE SURE YOU:  Understand these instructions.  Will watch your condition.  Will get help right away if you are not doing well or get worse.   This information is not intended to replace advice given to you by your health care provider. Make sure you discuss any questions you have with your health care provider.   Document Released: 04/18/2005 Document Revised: 05/09/2014 Document Reviewed: 09/11/2013 Elsevier Interactive Patient Education [employer]

## 2015-12-29 ENCOUNTER — Emergency Department (HOSPITAL_COMMUNITY): Payer: PPO

## 2015-12-29 ENCOUNTER — Inpatient Hospital Stay (HOSPITAL_COMMUNITY)
Admission: EM | Admit: 2015-12-29 | Discharge: 2016-01-05 | DRG: 175 | Disposition: A | Discharge status: Home Health | Payer: PPO | Attending: Family Medicine | Admitting: Family Medicine

## 2015-12-29 ENCOUNTER — Encounter (HOSPITAL_COMMUNITY): Payer: Self-pay

## 2015-12-29 DIAGNOSIS — I5043 Acute on chronic combined systolic (congestive) and diastolic (congestive) heart failure: Secondary | ICD-10-CM | POA: Diagnosis not present

## 2015-12-29 DIAGNOSIS — K219 Gastro-esophageal reflux disease without esophagitis: Secondary | ICD-10-CM | POA: Diagnosis not present

## 2015-12-29 DIAGNOSIS — E1122 Type 2 diabetes mellitus with diabetic chronic kidney disease: Secondary | ICD-10-CM | POA: Diagnosis not present

## 2015-12-29 DIAGNOSIS — I130 Hypertensive heart and chronic kidney disease with heart failure and stage 1 through stage 4 chronic kidney disease, or unspecified chronic kidney disease: Secondary | ICD-10-CM | POA: Diagnosis not present

## 2015-12-29 DIAGNOSIS — I2609 Other pulmonary embolism with acute cor pulmonale: Principal | ICD-10-CM | POA: Diagnosis present

## 2015-12-29 DIAGNOSIS — Z833 Family history of diabetes mellitus: Secondary | ICD-10-CM | POA: Diagnosis not present

## 2015-12-29 DIAGNOSIS — I081 Rheumatic disorders of both mitral and tricuspid valves: Secondary | ICD-10-CM | POA: Diagnosis present

## 2015-12-29 DIAGNOSIS — I420 Dilated cardiomyopathy: Secondary | ICD-10-CM | POA: Diagnosis not present

## 2015-12-29 DIAGNOSIS — J90 Pleural effusion, not elsewhere classified: Secondary | ICD-10-CM | POA: Diagnosis not present

## 2015-12-29 DIAGNOSIS — J441 Chronic obstructive pulmonary disease with (acute) exacerbation: Secondary | ICD-10-CM | POA: Diagnosis present

## 2015-12-29 DIAGNOSIS — E119 Type 2 diabetes mellitus without complications: Secondary | ICD-10-CM | POA: Diagnosis not present

## 2015-12-29 DIAGNOSIS — N183 Chronic kidney disease, stage 3 (moderate): Secondary | ICD-10-CM | POA: Diagnosis not present

## 2015-12-29 DIAGNOSIS — Z96652 Presence of left artificial knee joint: Secondary | ICD-10-CM | POA: Diagnosis not present

## 2015-12-29 DIAGNOSIS — T502X5A Adverse effect of carbonic-anhydrase inhibitors, benzothiadiazides and other diuretics, initial encounter: Secondary | ICD-10-CM | POA: Diagnosis not present

## 2015-12-29 DIAGNOSIS — R600 Localized edema: Secondary | ICD-10-CM | POA: Diagnosis not present

## 2015-12-29 DIAGNOSIS — I2699 Other pulmonary embolism without acute cor pulmonale: Secondary | ICD-10-CM | POA: Diagnosis present

## 2015-12-29 DIAGNOSIS — G894 Chronic pain syndrome: Secondary | ICD-10-CM | POA: Diagnosis present

## 2015-12-29 DIAGNOSIS — Z7984 Long term (current) use of oral hypoglycemic drugs: Secondary | ICD-10-CM

## 2015-12-29 DIAGNOSIS — F17210 Nicotine dependence, cigarettes, uncomplicated: Secondary | ICD-10-CM | POA: Diagnosis present

## 2015-12-29 DIAGNOSIS — I5041 Acute combined systolic (congestive) and diastolic (congestive) heart failure: Secondary | ICD-10-CM | POA: Diagnosis not present

## 2015-12-29 DIAGNOSIS — I429 Cardiomyopathy, unspecified: Secondary | ICD-10-CM

## 2015-12-29 DIAGNOSIS — M1712 Unilateral primary osteoarthritis, left knee: Secondary | ICD-10-CM | POA: Diagnosis present

## 2015-12-29 DIAGNOSIS — N189 Chronic kidney disease, unspecified: Secondary | ICD-10-CM | POA: Diagnosis not present

## 2015-12-29 DIAGNOSIS — N179 Acute kidney failure, unspecified: Secondary | ICD-10-CM | POA: Diagnosis not present

## 2015-12-29 DIAGNOSIS — Z825 Family history of asthma and other chronic lower respiratory diseases: Secondary | ICD-10-CM

## 2015-12-29 DIAGNOSIS — N289 Disorder of kidney and ureter, unspecified: Secondary | ICD-10-CM | POA: Diagnosis not present

## 2015-12-29 DIAGNOSIS — E785 Hyperlipidemia, unspecified: Secondary | ICD-10-CM | POA: Diagnosis not present

## 2015-12-29 DIAGNOSIS — I10 Essential (primary) hypertension: Secondary | ICD-10-CM | POA: Diagnosis not present

## 2015-12-29 DIAGNOSIS — R0602 Shortness of breath: Secondary | ICD-10-CM | POA: Diagnosis not present

## 2015-12-29 HISTORY — DX: Other pulmonary embolism without acute cor pulmonale: I26.99

## 2015-12-29 LAB — CBC WITH DIFFERENTIAL/PLATELET
Basophils Absolute: 0 10*3/uL (ref 0.0–0.1)
Basophils Relative: 0 %
EOS PCT: 2 %
Eosinophils Absolute: 0.1 10*3/uL (ref 0.0–0.7)
HCT: 36.2 % (ref 36.0–46.0)
Hemoglobin: 10.2 g/dL — ABNORMAL LOW (ref 12.0–15.0)
LYMPHS ABS: 2.5 10*3/uL (ref 0.7–4.0)
LYMPHS PCT: 26 %
MCH: 21.8 pg — AB (ref 26.0–34.0)
MCHC: 28.2 g/dL — AB (ref 30.0–36.0)
MCV: 77.4 fL — AB (ref 78.0–100.0)
MONO ABS: 0.7 10*3/uL (ref 0.1–1.0)
Monocytes Relative: 7 %
Neutro Abs: 6.2 10*3/uL (ref 1.7–7.7)
Neutrophils Relative %: 65 %
PLATELETS: 315 10*3/uL (ref 150–400)
RBC: 4.68 MIL/uL (ref 3.87–5.11)
RDW: 17.1 % — AB (ref 11.5–15.5)
WBC: 9.5 10*3/uL (ref 4.0–10.5)

## 2015-12-29 LAB — URINALYSIS, ROUTINE W REFLEX MICROSCOPIC
BILIRUBIN URINE: NEGATIVE
GLUCOSE, UA: NEGATIVE mg/dL
HGB URINE DIPSTICK: NEGATIVE
KETONES UR: NEGATIVE mg/dL
LEUKOCYTES UA: NEGATIVE
Nitrite: NEGATIVE
PH: 5 (ref 5.0–8.0)
Protein, ur: NEGATIVE mg/dL
Specific Gravity, Urine: 1.029 (ref 1.005–1.030)

## 2015-12-29 LAB — I-STAT ARTERIAL BLOOD GAS, ED
ACID-BASE EXCESS: 2 mmol/L (ref 0.0–2.0)
BICARBONATE: 26.5 mmol/L (ref 20.0–28.0)
O2 SAT: 93 %
TCO2: 28 mmol/L (ref 0–100)
pCO2 arterial: 39.2 mmHg (ref 32.0–48.0)
pH, Arterial: 7.438 (ref 7.350–7.450)
pO2, Arterial: 63 mmHg — ABNORMAL LOW (ref 83.0–108.0)

## 2015-12-29 LAB — BRAIN NATRIURETIC PEPTIDE
B Natriuretic Peptide: 930.2 pg/mL — ABNORMAL HIGH (ref 0.0–100.0)
Brain Natriuretic Peptide: 861.1 pg/mL — ABNORMAL HIGH (ref <100)

## 2015-12-29 LAB — COMPREHENSIVE METABOLIC PANEL
ALBUMIN: 3 g/dL — AB (ref 3.5–5.0)
ALT: 8 U/L — AB (ref 14–54)
AST: 16 U/L (ref 15–41)
Alkaline Phosphatase: 91 U/L (ref 38–126)
Anion gap: 11 (ref 5–15)
BILIRUBIN TOTAL: 0.6 mg/dL (ref 0.3–1.2)
BUN: 15 mg/dL (ref 6–20)
CHLORIDE: 100 mmol/L — AB (ref 101–111)
CO2: 25 mmol/L (ref 22–32)
CREATININE: 1.03 mg/dL — AB (ref 0.44–1.00)
Calcium: 9 mg/dL (ref 8.9–10.3)
GFR calc Af Amer: >60 mL/min (ref >60)
GFR calc non Af Amer: 59 mL/min — ABNORMAL LOW (ref >60)
GLUCOSE: 207 mg/dL — AB (ref 65–99)
POTASSIUM: 4 mmol/L (ref 3.5–5.1)
Sodium: 136 mmol/L (ref 135–145)
TOTAL PROTEIN: 6.6 g/dL (ref 6.5–8.1)

## 2015-12-29 LAB — GLUCOSE, CAPILLARY
GLUCOSE-CAPILLARY: 357 mg/dL — AB (ref 65–99)
GLUCOSE-CAPILLARY: 396 mg/dL — AB (ref 65–99)
Glucose-Capillary: 305 mg/dL — ABNORMAL HIGH (ref 65–99)

## 2015-12-29 LAB — I-STAT CG4 LACTIC ACID, ED
LACTIC ACID, VENOUS: 1.21 mmol/L (ref 0.5–1.9)
Lactic Acid, Venous: 3.67 mmol/L (ref 0.5–1.9)

## 2015-12-29 LAB — MRSA PCR SCREENING: MRSA by PCR: NEGATIVE

## 2015-12-29 LAB — TROPONIN I: Troponin I: 0.05 ng/mL (ref <0.03)

## 2015-12-29 MED ORDER — HYDROCHLOROTHIAZIDE 25 MG PO TABS
25.0000 mg | ORAL_TABLET | Freq: Every day | ORAL | Status: DC
  Administered 2015-12-29 – 2016-01-01 (×4): 25 mg via ORAL
  Filled 2015-12-29 (×4): qty 1

## 2015-12-29 MED ORDER — GABAPENTIN 300 MG PO CAPS
300.0000 mg | ORAL_CAPSULE | Freq: Three times a day (TID) | ORAL | Status: DC
  Administered 2015-12-29 – 2016-01-05 (×21): 300 mg via ORAL
  Filled 2015-12-29 (×21): qty 1

## 2015-12-29 MED ORDER — LEVALBUTEROL HCL 1.25 MG/0.5ML IN NEBU
1.2500 mg | INHALATION_SOLUTION | Freq: Once | RESPIRATORY_TRACT | Status: AC
  Administered 2015-12-29: 1.25 mg via RESPIRATORY_TRACT
  Filled 2015-12-29: qty 0.5

## 2015-12-29 MED ORDER — IPRATROPIUM BROMIDE 0.02 % IN SOLN
0.5000 mg | Freq: Once | RESPIRATORY_TRACT | Status: AC
  Administered 2015-12-29: 0.5 mg via RESPIRATORY_TRACT
  Filled 2015-12-29: qty 2.5

## 2015-12-29 MED ORDER — ENOXAPARIN SODIUM 100 MG/ML ~~LOC~~ SOLN
100.0000 mg | Freq: Once | SUBCUTANEOUS | Status: AC
  Administered 2015-12-29: 100 mg via SUBCUTANEOUS
  Filled 2015-12-29: qty 1

## 2015-12-29 MED ORDER — DIAZEPAM 5 MG PO TABS
5.0000 mg | ORAL_TABLET | Freq: Every evening | ORAL | Status: DC | PRN
  Administered 2015-12-29 – 2016-01-03 (×3): 5 mg via ORAL
  Filled 2015-12-29 (×3): qty 1

## 2015-12-29 MED ORDER — ASPIRIN 81 MG PO CHEW
324.0000 mg | CHEWABLE_TABLET | Freq: Once | ORAL | Status: AC
  Administered 2015-12-29: 324 mg via ORAL
  Filled 2015-12-29: qty 4

## 2015-12-29 MED ORDER — IOPAMIDOL (ISOVUE-370) INJECTION 76%
INTRAVENOUS | Status: AC
  Filled 2015-12-29: qty 100

## 2015-12-29 MED ORDER — METHYLPREDNISOLONE SODIUM SUCC 125 MG IJ SOLR
125.0000 mg | Freq: Once | INTRAMUSCULAR | Status: AC
  Administered 2015-12-29: 125 mg via INTRAVENOUS
  Filled 2015-12-29: qty 2

## 2015-12-29 MED ORDER — MOMETASONE FURO-FORMOTEROL FUM 100-5 MCG/ACT IN AERO
2.0000 | INHALATION_SPRAY | Freq: Two times a day (BID) | RESPIRATORY_TRACT | Status: DC
  Administered 2015-12-29 – 2016-01-05 (×12): 2 via RESPIRATORY_TRACT
  Filled 2015-12-29 (×2): qty 8.8

## 2015-12-29 MED ORDER — INSULIN ASPART 100 UNIT/ML ~~LOC~~ SOLN
0.0000 [IU] | Freq: Every day | SUBCUTANEOUS | Status: DC
  Administered 2015-12-29: 5 [IU] via SUBCUTANEOUS
  Administered 2015-12-31 – 2016-01-01 (×2): 2 [IU] via SUBCUTANEOUS
  Administered 2016-01-02: 3 [IU] via SUBCUTANEOUS

## 2015-12-29 MED ORDER — ENOXAPARIN SODIUM 100 MG/ML ~~LOC~~ SOLN
100.0000 mg | Freq: Two times a day (BID) | SUBCUTANEOUS | Status: DC
  Administered 2015-12-29 – 2015-12-30 (×3): 100 mg via SUBCUTANEOUS
  Filled 2015-12-29 (×3): qty 1

## 2015-12-29 MED ORDER — IPRATROPIUM BROMIDE 0.02 % IN SOLN
2.5000 mL | Freq: Four times a day (QID) | RESPIRATORY_TRACT | Status: DC
  Administered 2015-12-29 – 2015-12-31 (×7): 0.5 mg via RESPIRATORY_TRACT
  Filled 2015-12-29 (×7): qty 2.5

## 2015-12-29 MED ORDER — SODIUM CHLORIDE 0.9% FLUSH
3.0000 mL | Freq: Two times a day (BID) | INTRAVENOUS | Status: DC
  Administered 2015-12-29 – 2016-01-05 (×14): 3 mL via INTRAVENOUS

## 2015-12-29 MED ORDER — TRAMADOL HCL 50 MG PO TABS
50.0000 mg | ORAL_TABLET | Freq: Four times a day (QID) | ORAL | Status: DC | PRN
  Administered 2015-12-29 – 2015-12-31 (×5): 50 mg via ORAL
  Filled 2015-12-29 (×6): qty 1

## 2015-12-29 MED ORDER — FUROSEMIDE 10 MG/ML IJ SOLN
40.0000 mg | Freq: Once | INTRAMUSCULAR | Status: AC
  Administered 2015-12-29: 40 mg via INTRAVENOUS
  Filled 2015-12-29: qty 4

## 2015-12-29 MED ORDER — HYDROCODONE-ACETAMINOPHEN 7.5-325 MG PO TABS
1.0000 | ORAL_TABLET | Freq: Four times a day (QID) | ORAL | Status: DC | PRN
  Administered 2016-01-01 – 2016-01-03 (×2): 1 via ORAL
  Filled 2015-12-29 (×2): qty 1

## 2015-12-29 MED ORDER — MOMETASONE FURO-FORMOTEROL FUM 200-5 MCG/ACT IN AERO
2.0000 | INHALATION_SPRAY | Freq: Two times a day (BID) | RESPIRATORY_TRACT | Status: DC
  Filled 2015-12-29: qty 8.8

## 2015-12-29 MED ORDER — INSULIN ASPART 100 UNIT/ML ~~LOC~~ SOLN
0.0000 [IU] | Freq: Three times a day (TID) | SUBCUTANEOUS | Status: DC
Start: 2015-12-29 — End: 2016-01-05
  Administered 2015-12-29: 11 [IU] via SUBCUTANEOUS
  Administered 2015-12-30 (×3): 5 [IU] via SUBCUTANEOUS
  Administered 2015-12-31: 2 [IU] via SUBCUTANEOUS
  Administered 2015-12-31 (×2): 5 [IU] via SUBCUTANEOUS
  Administered 2016-01-01: 3 [IU] via SUBCUTANEOUS
  Administered 2016-01-01: 5 [IU] via SUBCUTANEOUS
  Administered 2016-01-01: 3 [IU] via SUBCUTANEOUS
  Administered 2016-01-02: 5 [IU] via SUBCUTANEOUS
  Administered 2016-01-02: 2 [IU] via SUBCUTANEOUS
  Administered 2016-01-02: 5 [IU] via SUBCUTANEOUS
  Administered 2016-01-03: 3 [IU] via SUBCUTANEOUS
  Administered 2016-01-03 (×2): 5 [IU] via SUBCUTANEOUS
  Administered 2016-01-04: 3 [IU] via SUBCUTANEOUS
  Administered 2016-01-04: 5 [IU] via SUBCUTANEOUS
  Administered 2016-01-04: 3 [IU] via SUBCUTANEOUS
  Administered 2016-01-05: 5 [IU] via SUBCUTANEOUS
  Administered 2016-01-05: 3 [IU] via SUBCUTANEOUS

## 2015-12-29 MED ORDER — NICOTINE 21 MG/24HR TD PT24
21.0000 mg | MEDICATED_PATCH | Freq: Every day | TRANSDERMAL | Status: DC
  Administered 2015-12-29 – 2016-01-05 (×8): 21 mg via TRANSDERMAL
  Filled 2015-12-29 (×8): qty 1

## 2015-12-29 MED ORDER — IOPAMIDOL (ISOVUE-370) INJECTION 76%: 100.0000 mL | Freq: Once | INTRAVENOUS | Status: DC | PRN

## 2015-12-29 MED ORDER — LOSARTAN POTASSIUM 50 MG PO TABS
100.0000 mg | ORAL_TABLET | Freq: Every day | ORAL | Status: DC
  Administered 2015-12-29 – 2016-01-01 (×4): 100 mg via ORAL
  Filled 2015-12-29 (×4): qty 2

## 2015-12-29 MED ORDER — FAMOTIDINE 20 MG PO TABS
40.0000 mg | ORAL_TABLET | Freq: Two times a day (BID) | ORAL | Status: DC
  Administered 2015-12-29 – 2016-01-05 (×15): 40 mg via ORAL
  Filled 2015-12-29 (×15): qty 2

## 2015-12-29 MED ORDER — SODIUM CHLORIDE 0.9 % IV SOLN
INTRAVENOUS | Status: DC
  Administered 2015-12-29: 15:00:00 via INTRAVENOUS

## 2015-12-29 MED ORDER — SODIUM CHLORIDE 0.9 % IV BOLUS (SEPSIS)
1000.0000 mL | Freq: Once | INTRAVENOUS | Status: AC
  Administered 2015-12-29: 1000 mL via INTRAVENOUS

## 2015-12-29 MED ORDER — DOCUSATE SODIUM 100 MG PO CAPS
100.0000 mg | ORAL_CAPSULE | Freq: Every day | ORAL | Status: DC
  Administered 2015-12-30 – 2016-01-05 (×7): 100 mg via ORAL
  Filled 2015-12-29 (×8): qty 1

## 2015-12-29 MED ORDER — CYCLOBENZAPRINE HCL 10 MG PO TABS: 10.0000 mg | ORAL_TABLET | Freq: Three times a day (TID) | ORAL | Status: DC | PRN

## 2015-12-29 MED ORDER — ENOXAPARIN SODIUM 120 MG/0.8ML ~~LOC~~ SOLN
1.0000 mg/kg | Freq: Once | SUBCUTANEOUS | Status: DC
  Filled 2015-12-29: qty 0.8

## 2015-12-29 NOTE — ED Provider Notes (Signed)
[facility]-EMER[facility]ENCY DEPT Provider Note   CSN: [id number] Arrival date & time: 12/29/15  0920     History   Chief Complaint Chief Complaint  Patient presents with  . Shortness of Breath    HPI Kaitlin Cantrell is a 60 y.o. female.  Pt has had sob for the past few days.  She saw her PCP yesterday and was given IM rocephin and labs and cxr were done.  Pt's ddimer came back positive, so a ct chest was ordered for later today.  The pt was tachycardic and was also given 2L of IVFs.  The pt's cxr showed an effusion .  Pt has not picked up her meds prescribed to her as she came here.  The pt said she continues to have sob and was up most of the night with the sob.  The pt has a questionable allergy to albuterol.  She said that she took it one time and she had some swelling to her lips.  She has taken it since then and was fine.  She is not sure if the swelling was due to albuterol or not.  Pt continues to smoke, but said that she wants to quit.  Pt called her dr this am and was told to come here instead of waiting for the ct.      Past Medical History:  Diagnosis Date  . Allergy    seasonal  . Arthritis   . Diabetes mellitus   . GERD (gastroesophageal reflux disease)   . History of pneumonia    15 years ago  . Hyperlipidemia   . Hypertension     Patient Active Problem List   Diagnosis Date Noted  . Pulmonary embolism (HCC) 12/29/2015  . Arthritis 10/01/2015  . Hyperlipidemia 10/01/2015  . Chronic pain syndrome 10/01/2015  . Chronic obstructive pulmonary disease (HCC) 10/01/2015  . Tobacco use disorder 10/01/2015  . Diabetes mellitus, type II (HCC) 03/30/2012  . Polypharmacy 03/30/2012  . Osteoarthritis of left knee 02/17/2012    Class: Diagnosis of  . HTN (hypertension) 05/10/2011    Past Surgical History:  Procedure Laterality Date  . ANKLE FRACTURE SURGERY  1974   left and pinned then pins removed  . ANKLE GANGLION CYST EXCISION  1994   right  . BACK SURGERY  1998   discectomy and then fusion  . KNEE ARTHROPLASTY  02/14/2012   Procedure: COMPUTER ASSISTED TOTAL KNEE ARTHROPLASTY;  Surgeon: Cammy Copa, MD;  Location: [facility] OR;  Service: Orthopedics;  Laterality: Left;  Left total knee arthroplasty  . TONSILLECTOMY  1972  . TUBAL LIGATION  1996  . UMBILICAL HERNIA REPAIR  1996    OB History    No data available       Home Medications    Prior to Admission medications   Medication Sig Start Date End Date Taking? Authorizing Provider  budesonide-formoterol (SYMBICORT) 160-4.5 MCG/ACT inhaler Inhale 1 puff into the lungs 2 (two) times daily. 10/01/15  Yes Sherren Mocha, MD  cyclobenzaprine (FLEXERIL) 10 MG tablet TAKE ONE TABLET BY MOUTH THREE TIMES DAILY AS NEEDED FOR MUSCLE SPASMS 10/01/15  Yes Sherren Mocha, MD  diazepam (VALIUM) 5 MG tablet TAKE ONE TABLET BY MOUTH AT BEDTIME AND  MAY  REPEAT  DOSE  ONE  TIME  IF  NEEDED 10/01/15  Yes Sherren Mocha, MD  gabapentin (NEURONTIN) 300 MG capsule Take 1 capsule (300 mg total) by mouth 3 (three) times daily. Titrate up slowely 10/01/15  Yes Carley Hammed  Romie LeveeN Shaw, MD  glimepiride (AMARYL) 2 MG tablet TAKE ONE TABLET BY MOUTH TWICE DAILY 12/22/15  Yes Sherren MochaEva N Shaw, MD  HYDROcodone-acetaminophen (NORCO) 7.5-325 MG tablet Take 1 tablet by mouth every 6 (six) hours as needed for pain. 12/04/15  Yes Historical Provider, MD  ipratropium (ATROVENT HFA) 17 MCG/ACT inhaler Inhale 2 puffs into the lungs 4 (four) times daily. 11/16/15  Yes Sherren MochaEva N Shaw, MD  [employer] CAPS Take 1 capsule by mouth daily.    Yes Historical Provider, MD  losartan-hydrochlorothiazide (HYZAAR) 100-25 MG tablet Take 1 tablet by mouth daily. 10/01/15  Yes Sherren MochaEva N Shaw, MD  lovastatin (MEVACOR) 20 MG tablet TAKE 2 TABLETS BY MOUTH DAILY AT 6PM 12/22/15  Yes Sherren MochaEva N Shaw, MD  metFORMIN (GLUCOPHAGE) 1000 MG tablet TAKE ONE TABLET BY MOUTH TWICE DAILY WITH  MEALS. 12/22/15  Yes Sherren MochaEva N Shaw, MD  omeprazole (PRILOSEC) 20 MG capsule Take 1 capsule (20 mg total) by mouth daily. 09/07/12  Yes  Sherren MochaEva N Shaw, MD  potassium chloride SA (K-DUR,KLOR-CON) 20 MEQ tablet Take 1 tablet (20 mEq total) by mouth daily. With the lasix 12/28/15  Yes Sherren MochaEva N Shaw, MD  ranitidine (ZANTAC) 150 MG tablet TAKE ONE TABLET BY MOUTH TWICE DAILY. 09/11/15  Yes Sherren MochaEva N Shaw, MD  traMADol (ULTRAM) 50 MG tablet Take 50 mg by mouth every 6 (six) hours as needed for pain. 12/04/15  Yes Historical Provider, MD  azelastine (OPTIVAR) 0.05 % ophthalmic solution Place 1 drop into both eyes 2 (two) times daily. Patient not taking: Reported on 12/29/2015 09/05/14   Sherren MochaEva N Shaw, MD  clotrimazole-betamethasone (LOTRISONE) cream Apply 1 application topically 2 (two) times daily. Patient not taking: Reported on 12/29/2015 10/01/15   Sherren MochaEva N Shaw, MD  furosemide (LASIX) 40 MG tablet Take 1 tablet (40 mg total) by mouth daily. Patient not taking: Reported on 12/29/2015 12/28/15   Sherren MochaEva N Shaw, MD  levofloxacin (LEVAQUIN) 750 MG tablet Take 1 tablet (750 mg total) by mouth daily. 12/28/15   Sherren MochaEva N Shaw, MD  metoCLOPramide (REGLAN) 10 MG tablet Take 1 tablet (10 mg total) by mouth 2 (two) times daily. Patient not taking: Reported on 12/29/2015 10/23/15   Domenick GongAshley Mortenson, MD  polyethylene glycol-electrolytes (NULYTELY/GOLYTELY) 420 g solution Take 4,000 mLs by mouth once. Patient not taking: Reported on 12/29/2015 10/23/15   Domenick GongAshley Mortenson, MD    Family History Family History  Problem Relation Age of Onset  . Colon polyps Father   . Diabetes Father     mother  . Heart disease Father   . Heart murmur Mother   . Diabetes Mother   . Hypertension Mother   . Diabetes Brother   . Cancer Maternal Grandmother   . Diabetes Paternal Grandmother   . Heart disease Paternal Grandmother   . COPD Paternal Grandfather   . Heart disease Paternal Grandfather   . Hypertension Brother   . Hypertension Brother     Social History Social History  Substance Use Topics  . Smoking status: Current Every Day Smoker    Packs/day: 1.00    Years: 40.00    Types:  Cigarettes  . Smokeless tobacco: Never Used  . Alcohol use No     Allergies   Albuterol and Morphine and related   Review of Systems Review of Systems  Respiratory: Positive for cough, shortness of breath and wheezing.   All other systems reviewed and are negative.    Physical Exam Updated Vital Signs BP 143/79  Pulse 108   Temp 98.3 F (36.8 C) (Oral)   Resp 13   Ht 5\' 6"  (1.676 m)   Wt 226 lb (102.5 kg)   SpO2 97%   BMI 36.48 kg/m   Physical Exam  Constitutional: She is oriented to person, place, and time. She appears well-developed and well-nourished.  HENT:  Head: Normocephalic and atraumatic.  Right Ear: External ear normal.  Left Ear: External ear normal.  Nose: Nose normal.  Mouth/Throat: Oropharynx is clear and moist.  Eyes: Conjunctivae and EOM are normal. Pupils are equal, round, and reactive to light.  Neck: Normal range of motion. Neck supple.  Cardiovascular: Regular rhythm, normal heart sounds and intact distal pulses.  Tachycardia present.   Pulmonary/Chest: Tachypnea noted. She has wheezes.  Abdominal: Soft. Bowel sounds are normal.  Musculoskeletal: Normal range of motion. She exhibits edema.  Neurological: She is alert and oriented to person, place, and time.  Skin: Skin is warm and dry.  Psychiatric: She has a normal mood and affect. Her behavior is normal. Judgment and thought content normal.  Nursing note and vitals reviewed.    ED Treatments / Results  Labs (all labs ordered are listed, but only abnormal results are displayed) Labs Reviewed  COMPREHENSIVE METABOLIC PANEL - Abnormal; Notable for the following:       Result Value   Chloride 100 (*)    Glucose, Bld 207 (*)    Creatinine, Ser 1.03 (*)    Albumin 3.0 (*)    ALT 8 (*)    GFR calc non Af Amer 59 (*)    All other components within normal limits  CBC WITH DIFFERENTIAL/PLATELET - Abnormal; Notable for the following:    Hemoglobin 10.2 (*)    MCV 77.4 (*)    MCH 21.8 (*)     MCHC 28.2 (*)    RDW 17.1 (*)    All other components within normal limits  TROPONIN I - Abnormal; Notable for the following:    Troponin I 0.05 (*)    All other components within normal limits  BRAIN NATRIURETIC PEPTIDE - Abnormal; Notable for the following:    B Natriuretic Peptide 930.2 (*)    All other components within normal limits  I-STAT ARTERIAL BLOOD GAS, ED - Abnormal; Notable for the following:    pO2, Arterial 63.0 (*)    All other components within normal limits  I-STAT CG4 LACTIC ACID, ED - Abnormal; Notable for the following:    Lactic Acid, Venous 3.67 (*)    All other components within normal limits  CULTURE, BLOOD (ROUTINE X 2)  CULTURE, BLOOD (ROUTINE X 2)  URINALYSIS, ROUTINE W REFLEX MICROSCOPIC (NOT AT [facility])  I-STAT CG4 LACTIC ACID, ED    EKG  EKG Interpretation  Date/Time:  Tuesday December 29 2015 10:24:02 EDT Ventricular Rate:  108 PR Interval:    QRS Duration: 114 QT Interval:  348 QTC Calculation: 467 R Axis:   -2 Text Interpretation:  Sinus tachycardia Ventricular premature complex Probable left atrial enlargement Anterior infarct, old Confirmed by [facility] MD, Miriya Cloer [phone]) on 12/29/2015 10:30:00 AM       Radiology Dg Chest 2 View  Result Date: 12/28/2015 CLINICAL DATA:  Increasing shortness of breath over the past 3 months EXAM: CHEST  2 VIEW COMPARISON:  PA and lateral chest x-ray of November 16, 2015 FINDINGS: There is a small to moderate size left pleural effusion which has increased significantly since the previous study. There is no right pleural effusion. No  discrete infiltrate is observed though certainly left basilar atelectasis or infiltrate may be present. The cardiac silhouette is enlarged. The pulmonary vascularity is not engorged. The mediastinum is normal in width. The bony thorax exhibits no acute abnormality. IMPRESSION: Increased size of the left pleural effusion small to moderate in volume. Cardiomegaly without pulmonary edema.  Electronically Signed   By: David  [place] M.D.   On: 12/28/2015 15:15   Ct Angio Chest Pe W And/or Wo Contrast  Result Date: 12/29/2015 CLINICAL DATA:  Increasing shortness of Breath. Question pulmonary embolus. EXAM: CT ANGIOGRAPHY CHEST WITH CONTRAST TECHNIQUE: Multidetector CT imaging of the chest was performed using the standard protocol during bolus administration of intravenous contrast. Multiplanar CT image reconstructions and MIPs were obtained to evaluate the vascular anatomy. CONTRAST:  Contrast 100 cc Isovue 370 COMPARISON:  None. FINDINGS: Cardiovascular: Heart is enlarged. No pericardial effusion. No thoracic aortic aneurysm. Acute pulmonary embolus is identified in segmental and subsegmental pulmonary arteries to the right upper lobe, right middle lobe, and both lower lobes. RV/LV ratios difficult to assess given the lack of contrast on left heart, but the ratio is probably approaching 1.0. Mediastinum/Nodes: 10 mm short axis prevascular lymph node is upper normal to borderline increased. No other mediastinal lymphadenopathy is evident. There is no hilar lymphadenopathy. There is no axillary lymphadenopathy. Lungs/Pleura: Assessment of lung parenchyma degraded by patient breathing motion during image acquisition. Small wedge-shaped peripheral defect posterior right lower lobe may be related atelectasis or infarct. There is left base collapse/ consolidation with small to moderate left pleural effusion. Upper Abdomen: Unremarkable. Musculoskeletal: Convex rightward lumbar scoliosis noted. Bone windows reveal no worrisome lytic or sclerotic osseous lesions. Review of the MIP images confirms the above findings. IMPRESSION: 1. Positive for acute PE with CT evidence of right heart strain (RV/LV Ratio = 1.0) consistent with at least submassive (intermediate risk) PE. The presence of right heart strain has been associated with an increased risk of morbidity and mortality. Please activate Code PE by paging  [phone]. Critical Value/emergent results were called by telephone at the time of interpretation on 12/29/2015 at 12:06 pm to Dr. Jacalyn Lefevre , who verbally acknowledged these results. Electronically Signed   By: Kennith Center M.D.   On: 12/29/2015 12:06    Procedures Procedures (including critical care time)  Medications Ordered in ED Medications  iopamidol (ISOVUE-370) 76 % injection (not administered)  iopamidol (ISOVUE-370) 76 % injection 100 mL (not administered)  enoxaparin (LOVENOX) injection 100 mg (not administered)  methylPREDNISolone sodium succinate (SOLU-MEDROL) 125 mg/2 mL injection 125 mg (125 mg Intravenous Given 12/29/15 0956)  levalbuterol (XOPENEX) nebulizer solution 1.25 mg (1.25 mg Nebulization Given 12/29/15 1039)  ipratropium (ATROVENT) nebulizer solution 0.5 mg (0.5 mg Nebulization Given 12/29/15 1038)  sodium chloride 0.9 % bolus 1,000 mL (1,000 mLs Intravenous New Bag/Given 12/29/15 1032)  aspirin chewable tablet 324 mg (324 mg Oral Given 12/29/15 1121)     Initial Impression / Assessment and Plan / ED Course  I have reviewed the triage vital signs and the nursing notes.  Pertinent labs & imaging results that were available during my care of the patient were reviewed by me and considered in my medical decision making (see chart for details).  Clinical Course   Pt d/w critical care who does not think pt meets criteria for code PE as she is stable.  He said that pt can go to the floor.    Pt d/w the family medicine resident who will admit pt.  Pt is ok with this plan.  Final Clinical Impressions(s) / ED Diagnoses   Final diagnoses:  Other acute pulmonary embolism with acute cor pulmonale (HCC)    New Prescriptions New Prescriptions   No medications on file     Jacalyn Lefevre, MD 12/29/15 1240

## 2015-12-29 NOTE — ED Notes (Signed)
Report given to [place], [place]

## 2015-12-29 NOTE — Progress Notes (Signed)
Performed pre and post treatment peak flow maneuvers.  Pt gave reasonable effort.  Less than 10% improvement post.  150 to 160.

## 2015-12-29 NOTE — ED Notes (Signed)
Report attempted to [facility] on 3E

## 2015-12-29 NOTE — Progress Notes (Signed)
ANTICOAGULATION CONSULT NOTE - Initial Consult  Pharmacy Consult for Lovenox Indication: pulmonary embolus  Allergies  Allergen Reactions  . Albuterol     Throat/lips swelling  . Morphine And Related Other (See Comments)    Very aggressive and doesn't help pain    Patient Measurements: Height: 5\' 6"  (167.6 cm) Weight: 226 lb (102.5 kg) IBW/kg (Calculated) : 59.3  Vital Signs: Temp: 98.3 F (36.8 C) (08/29 0937) Temp Source: Oral (08/29 0937) BP: 143/96 (08/29 1300) Pulse Rate: 106 (08/29 1300)  Labs:  Recent Labs  12/28/15 1509 12/28/15 1516 12/29/15 0950  HGB  --  10.8* 10.2*  HCT  --  33.3* 36.2  PLT  --   --  315  CREATININE 1.05  --  1.03*  TROPONINI  --   --  0.05*    Estimated Creatinine Clearance: 72 mL/min (by C-G formula based on SCr of 1.03 mg/dL).   Medical History: Past Medical History:  Diagnosis Date  . Allergy    seasonal  . Arthritis   . Diabetes mellitus   . GERD (gastroesophageal reflux disease)   . History of pneumonia    15 years ago  . Hyperlipidemia   . Hypertension     Medications:  Prescriptions Prior to Admission  Medication Sig Dispense Refill Last Dose  . budesonide-formoterol (SYMBICORT) 160-4.5 MCG/ACT inhaler Inhale 1 puff into the lungs 2 (two) times daily. 1 Inhaler 12 12/28/2015 at Unknown time  . cyclobenzaprine (FLEXERIL) 10 MG tablet TAKE ONE TABLET BY MOUTH THREE TIMES DAILY AS NEEDED FOR MUSCLE SPASMS 270 tablet 1 12/28/2015 at Unknown time  . diazepam (VALIUM) 5 MG tablet TAKE ONE TABLET BY MOUTH AT BEDTIME AND  MAY  REPEAT  DOSE  ONE  TIME  IF  NEEDED 180 tablet 1 12/28/2015 at Unknown time  . gabapentin (NEURONTIN) 300 MG capsule Take 1 capsule (300 mg total) by mouth 3 (three) times daily. Titrate up slowely 90 capsule 3 12/28/2015 at Unknown time  . glimepiride (AMARYL) 2 MG tablet TAKE ONE TABLET BY MOUTH TWICE DAILY 180 tablet 0 12/28/2015 at Unknown time  . HYDROcodone-acetaminophen (NORCO) 7.5-325 MG tablet Take  1 tablet by mouth every 6 (six) hours as needed for pain.   12/28/2015 at Unknown time  . ipratropium (ATROVENT HFA) 17 MCG/ACT inhaler Inhale 2 puffs into the lungs 4 (four) times daily. 1 Inhaler 12 12/28/2015 at Unknown time  . Krill Oil CAPS Take 1 capsule by mouth daily.    12/28/2015 at Unknown time  . losartan-hydrochlorothiazide (HYZAAR) 100-25 MG tablet Take 1 tablet by mouth daily. 90 tablet 1 12/28/2015 at Unknown time  . lovastatin (MEVACOR) 20 MG tablet TAKE 2 TABLETS BY MOUTH DAILY AT 6PM 180 tablet 0 12/28/2015 at Unknown time  . metFORMIN (GLUCOPHAGE) 1000 MG tablet TAKE ONE TABLET BY MOUTH TWICE DAILY WITH  MEALS. 180 tablet 0 12/28/2015 at Unknown time  . omeprazole (PRILOSEC) 20 MG capsule Take 1 capsule (20 mg total) by mouth daily. 1 capsule 0 12/28/2015 at Unknown time  . potassium chloride SA (K-DUR,KLOR-CON) 20 MEQ tablet Take 1 tablet (20 mEq total) by mouth daily. With the lasix 30 tablet 0 Past Week at Unknown time  . ranitidine (ZANTAC) 150 MG tablet TAKE ONE TABLET BY MOUTH TWICE DAILY. 180 tablet 0 12/28/2015 at Unknown time  . traMADol (ULTRAM) 50 MG tablet Take 50 mg by mouth every 6 (six) hours as needed for pain.   12/28/2015 at Unknown time  . azelastine (OPTIVAR)  0.05 % ophthalmic solution Place 1 drop into both eyes 2 (two) times daily. (Patient not taking: Reported on 12/29/2015) 6 mL 12 Not Taking at Unknown time  . clotrimazole-betamethasone (LOTRISONE) cream Apply 1 application topically 2 (two) times daily. (Patient not taking: Reported on 12/29/2015) 45 g 2 Not Taking at Unknown time  . furosemide (LASIX) 40 MG tablet Take 1 tablet (40 mg total) by mouth daily. (Patient not taking: Reported on 12/29/2015) 30 tablet 0 Not Taking at Unknown time  . levofloxacin (LEVAQUIN) 750 MG tablet Take 1 tablet (750 mg total) by mouth daily. 7 tablet 0   . metoCLOPramide (REGLAN) 10 MG tablet Take 1 tablet (10 mg total) by mouth 2 (two) times daily. (Patient not taking: Reported on  12/29/2015) 10 tablet 0 Not Taking at Unknown time  . polyethylene glycol-electrolytes (NULYTELY/GOLYTELY) 420 g solution Take 4,000 mLs by mouth once. (Patient not taking: Reported on 12/29/2015) 4000 mL 0 Completed Course at Unknown time   Scheduled:  . docusate sodium  100 mg Oral Daily  . famotidine  40 mg Oral BID  . gabapentin  300 mg Oral TID  . hydrochlorothiazide  25 mg Oral Daily  . insulin aspart  0-15 Units Subcutaneous TID WC  . insulin aspart  0-5 Units Subcutaneous QHS  . iopamidol      . ipratropium  2 puff Inhalation QID  . losartan  100 mg Oral Daily  . mometasone-formoterol  2 puff Inhalation BID  . sodium chloride flush  3 mL Intravenous Q12H   Infusions:  . sodium chloride      Assessment: 60yo female with history of DM2, HTN and COPD presents with SOB. Pharmacy is consulted to dose lovenox for pulmonary embolus. Pt received lovenox 1mg /kg once in the ED.  Goal of Therapy:  Monitor platelets by anticoagulation protocol: Yes   Plan:  Lovenox 1mg /kg subcutaneously q12h Monitor renal function and s/sx of bleeding  Arlean Hopping. Newman Pies, PharmD, BCPS Clinical Pharmacist Pager [phone] 12/29/2015,1:56 PM

## 2015-12-29 NOTE — ED Notes (Addendum)
Positive troponin or 0.05 called and Dr. Ander Slade notified and Dr. Particia Nearing

## 2015-12-29 NOTE — ED Triage Notes (Signed)
Patient here with increasing shortness of breath, has been treated for pneumonia and this am told she may have PE after her MD called. Speaking single words, tachypnea on arrival. Alert and oriented. Denies CP

## 2015-12-29 NOTE — Progress Notes (Signed)
By signing my name below, I, Mesha Guinyard, attest that this documentation has been prepared under the direction and in the presence of Norberto Sorenson.  Electronically Signed: Arvilla Market, Medical Scribe. 12/29/15. 2:51 PM.  Subjective:    Patient ID: Kaitlin Cantrell, female    DOB: 1955-09-12, 60 y.o.   MRN: [id number]  HPI Chief Complaint  Patient presents with  . Follow-up    for pneumonia     HPI Comments: Kaitlin Cantrell is a 60 y.o. female who presents to the [facility] for PNA follow-up. Pt was treated with doxycycline 6 weeks prior, and a prednisone injection followed by azithromycin with 40 mg DepoMedrol. Pt mentions her symptoms got better, but then went on a decline 4 days ago. Pt gets post nasal drip with thin clear sputum when she lays down Pt reports bilateral (L>R) leg edema, episodic palpitations, and light-headedness with coughing, wheezing, and SOB. Pt states she can't walk too far without getting SOB. Pt states she didn't do anything over the weekend due to her SOB. Pt is still doing Symbicort that helps temporarily, and her Atrovent inhaler about 2-3 times a day. Pt recently threw away her nebulizer at home since it was old and wasn't been using it.. Pt smokes about .5 pack, but states this is less than usual. Pt denies fevers, and chills.   Patient Active Problem List   Diagnosis Date Noted  . Arthritis 10/01/2015  . Hyperlipidemia 10/01/2015  . Chronic pain syndrome 10/01/2015  . Chronic obstructive pulmonary disease (HCC) 10/01/2015  . Tobacco use disorder 10/01/2015  . Diabetes mellitus, type II (HCC) 03/30/2012  . Polypharmacy 03/30/2012  . Osteoarthritis of left knee 02/17/2012    Class: Diagnosis of  . HTN (hypertension) 05/10/2011   Past Medical History:  Diagnosis Date  . Allergy    seasonal  . Arthritis   . Diabetes mellitus   . GERD (gastroesophageal reflux disease)   . History of pneumonia    15 years ago  .  Hyperlipidemia   . Hypertension    Past Surgical History:  Procedure Laterality Date  . ANKLE FRACTURE SURGERY  1974   left and pinned then pins removed  . ANKLE GANGLION CYST EXCISION  1994   right  . BACK SURGERY  1998   discectomy and then fusion  . KNEE ARTHROPLASTY  02/14/2012   Procedure: COMPUTER ASSISTED TOTAL KNEE ARTHROPLASTY;  Surgeon: Cammy Copa, MD;  [facility] OR;  Service: Orthopedics;  Laterality: Left;  Left total knee arthroplasty  . TONSILLECTOMY  1972  . TUBAL LIGATION  1996  . UMBILICAL HERNIA REPAIR  1996   Allergies  Allergen Reactions  . Albuterol     Throat/lips swelling  . Morphine And Related Other (See Comments)    Very aggressive and doesn't help pain   Prior to Admission medications   Medication Sig Start Date End Date Taking? Authorizing Provider  aspirin EC 81 MG tablet Take 81 mg by mouth daily.   Yes Historical Provider, MD  azelastine (OPTIVAR) 0.05 % ophthalmic solution Place 1 drop into both eyes 2 (two) times daily. 09/05/14  Yes Sherren Mocha, MD  budesonide-formoterol (SYMBICORT) 160-4.5 MCG/ACT inhaler Inhale 1 puff into the lungs 2 (two) times daily. 10/01/15  Yes Sherren Mocha, MD  clotrimazole-betamethasone (LOTRISONE) cream Apply 1 application topically 2 (two) times daily. 10/01/15  Yes Sherren Mocha, MD  cyclobenzaprine (FLEXERIL) 10 MG tablet TAKE ONE TABLET BY MOUTH  THREE TIMES DAILY AS NEEDED FOR MUSCLE SPASMS 10/01/15  Yes Sherren Mocha, MD  diazepam (VALIUM) 5 MG tablet TAKE ONE TABLET BY MOUTH AT BEDTIME AND  MAY  REPEAT  DOSE  ONE  TIME  IF  NEEDED 10/01/15  Yes Sherren Mocha, MD  gabapentin (NEURONTIN) 300 MG capsule Take 1 capsule (300 mg total) by mouth 3 (three) times daily. Titrate up slowely 10/01/15  Yes Sherren Mocha, MD  glimepiride (AMARYL) 2 MG tablet TAKE ONE TABLET BY MOUTH TWICE DAILY 12/22/15  Yes Sherren Mocha, MD  ipratropium (ATROVENT HFA) 17 MCG/ACT inhaler Inhale 2 puffs into the lungs 4 (four) times daily. 11/16/15  Yes Sherren Mocha,  MD  [facility] CAPS Take 1 capsule by mouth 2 (two) times daily.    Yes Historical Provider, MD  losartan-hydrochlorothiazide (HYZAAR) 100-25 MG tablet Take 1 tablet by mouth daily. 10/01/15  Yes Sherren Mocha, MD  lovastatin (MEVACOR) 20 MG tablet TAKE 2 TABLETS BY MOUTH DAILY AT 6PM 12/22/15  Yes Sherren Mocha, MD  metFORMIN (GLUCOPHAGE) 1000 MG tablet TAKE ONE TABLET BY MOUTH TWICE DAILY WITH  MEALS. 12/22/15  Yes Sherren Mocha, MD  metoCLOPramide (REGLAN) 10 MG tablet Take 1 tablet (10 mg total) by mouth 2 (two) times daily. 10/23/15  Yes Domenick Gong, MD  omeprazole (PRILOSEC) 20 MG capsule Take 1 capsule (20 mg total) by mouth daily. 09/07/12  Yes Sherren Mocha, MD  polyethylene glycol-electrolytes (NULYTELY/GOLYTELY) 420 g solution Take 4,000 mLs by mouth once. 10/23/15  Yes Domenick Gong, MD  ranitidine (ZANTAC) 150 MG tablet TAKE ONE TABLET BY MOUTH TWICE DAILY. 09/11/15  Yes Sherren Mocha, MD   Social History   Social History  . Marital status: Married    Spouse name: N/A  . Number of children: 0  . Years of education: N/A   Occupational History  . retired    Social History Main Topics  . Smoking status: Current Every Day Smoker    Packs/day: 1.00    Years: 40.00    Types: Cigarettes  . Smokeless tobacco: Never Used  . Alcohol use No  . Drug use: No  . Sexual activity: Not on file   Other Topics Concern  . Not on file   Social History Narrative   Exercise walking daily (varies)   Depression screen [facility] 2/9 12/28/2015 10/01/2015 09/05/2014  Decreased Interest 0 0 0  Down, Depressed, Hopeless 0 1 0  PHQ - 2 Score 0 1 0   Review of Systems  Constitutional: Positive for activity change, appetite change, diaphoresis and fatigue. Negative for chills and fever.  HENT: Negative for congestion, postnasal drip, rhinorrhea, sinus pressure and trouble swallowing.   Respiratory: Positive for cough, chest tightness, shortness of breath and wheezing.   Cardiovascular: Positive for palpitations and  leg swelling. Negative for chest pain.  Gastrointestinal: Negative for abdominal pain.  Musculoskeletal: Positive for arthralgias and back pain. Negative for gait problem.  Neurological: Positive for weakness and light-headedness. Negative for syncope and speech difficulty.  Psychiatric/Behavioral: Positive for sleep disturbance.   Objective:  Physical Exam  Constitutional: She appears well-developed and well-nourished. No distress.  HENT:  Head: Normocephalic and atraumatic.  Eyes: Conjunctivae are normal.  Neck: Neck supple.  Cardiovascular: Regular rhythm.  Tachycardia present.   Pulses:      Dorsalis pedis pulses are 2+ on the right side, and 2+ on the left side.  Pulmonary/Chest: Effort normal. She has rhonchi (inspiratory  and expiatory).  Dec air movement throughout  Musculoskeletal:  2+ pitting edema to the knee  Lymphadenopathy:    She has cervical adenopathy (bilterally).  Neurological: She is alert.  Skin: Skin is warm and dry.  Mild to moderate with varicose Clamy skin  Psychiatric: She has a normal mood and affect. Her behavior is normal.  Nursing note and vitals reviewed.  BP 124/80   Pulse (!) 117   Temp 98.1 F (36.7 C) (Oral)   Resp 20   Ht 5\' 6"  (1.676 m)   Wt 226 lb 6.4 oz (102.7 kg)   SpO2 98%   BMI 36.54 kg/m    Results for orders placed or performed in visit on 12/28/15  Comprehensive metabolic panel  Result Value Ref Range   Sodium 137 135 - 146 mmol/L   Potassium 4.7 3.5 - 5.3 mmol/L   Chloride 99 98 - 110 mmol/L   CO2 28 20 - 31 mmol/L   Glucose, Bld 168 (H) 65 - 99 mg/dL   BUN 16 7 - 25 mg/dL   Creat 6.961.05 2.950.50 - 2.841.05 mg/dL   Total Bilirubin 0.5 0.2 - 1.2 mg/dL   Alkaline Phosphatase 99 33 - 130 U/L   AST 10 10 - 35 U/L   ALT 6 6 - 29 U/L   Total Protein 6.6 6.1 - 8.1 g/dL   Albumin 3.6 3.6 - 5.1 g/dL   Calcium 9.0 8.6 - 13.210.4 mg/dL  Brain natriuretic peptide  Result Value Ref Range   Brain Natriuretic Peptide  <100 pg/mL  D-dimer,  quantitative (not at [facility])  Result Value Ref Range   D-Dimer, Quant 2.44 (H) <0.50 mcg/mL FEU  POCT CBC  Result Value Ref Range   WBC 10.9 (A) 4.6 - 10.2 K/uL   Lymph, poc 2.5 0.6 - 3.4   POC LYMPH PERCENT 23.0 10 - 50 %L   MID (cbc) 0.7 0 - 0.9   POC MID % 6.5 0 - 12 %M   POC Granulocyte 7.7 (A) 2 - 6.9   Granulocyte percent 70.5 37 - 80 %G   RBC 4.72 4.04 - 5.48 M/uL   Hemoglobin 10.8 (A) 12.2 - 16.2 g/dL   HCT, POC 44.033.3 (A) 10.237.7 - 47.9 %   MCV 70.5 (A) 80 - 97 fL   MCH, POC 22.8 (A) 27 - 31.2 pg   MCHC 32.4 31.8 - 35.4 g/dL   RDW, POC 72.517.8 %   Platelet Count, POC 328 142 - 424 K/uL   MPV 6.8 0 - 99.8 fL  POCT SEDIMENTATION RATE  Result Value Ref Range   POCT SED RATE 38 (A) 0 - 22 mm/hr  POCT glucose (manual entry)  Result Value Ref Range   POC Glucose 196 (A) 70 - 99 mg/dl   Dg Chest 2 View  Result Date: 12/28/2015 CLINICAL DATA:  Increasing shortness of breath over the past 3 months EXAM: CHEST  2 VIEW COMPARISON:  PA and lateral chest x-ray of November 16, 2015 FINDINGS: There is a small to moderate size left pleural effusion which has increased significantly since the previous study. There is no right pleural effusion. No discrete infiltrate is observed though certainly left basilar atelectasis or infiltrate may be present. The cardiac silhouette is enlarged. The pulmonary vascularity is not engorged. The mediastinum is normal in width. The bony thorax exhibits no acute abnormality. IMPRESSION: Increased size of the left pleural effusion small to moderate in volume. Cardiomegaly without pulmonary edema. Electronically Signed   By:  David  [place] M.D.   On: 12/28/2015 15:15   [EKG Reading]: Sinus  Tachycardia  - frequent multiform ectopic ventricular beats  # VECs = 3, # types 2 -Left axis -anterior fascicular block.   -Left atrial enlargement.   -Old anterior infarct.   -  Nonspecific T-abnormality.   Low voltage with rightward P-axis and rotation -possible pulmonary disease.     Lost R waves in aVR and AVF since prior EKGs on 02/07/12.  Medial chest leads with elevated J point and ST seg   [4:02 PM]: Pt's O2 96% with her heart rate 110-115  Assessment & Plan:   1. Shortness of breath   2. Cough   3. Tachycardia - gave about 250 cc NS IVF in office w/o improvement in HR  4. Other fatigue   5. Type 2 diabetes mellitus without complication, without long-term current use of insulin (HCC)   6. Tobacco use disorder - still smoking 1/2 ppd though sig decreased from prior per pt.  7. Polypharmacy   8. Chronic pain syndrome   9. Essential hypertension   10. Chronic obstructive pulmonary disease, unspecified COPD type (HCC) - O2 sat nml, allergic to albuterol with angioedema but cont symbicort bid and increase atrovent use to scheduled.  May need to start po course of prednisone but will defer until we get further info that her sxs could be due to a COPD exac.  Pt reports her cbgs minimally elev with IM dose of DepoMedrol 40 last mo.  11. Pedal edema - elevate legs, will do d-dimer to r/o DVT/PE as etiology of sxs.  D-dimer + so send for stat chest CTA, start lasix 40 with K 20.  12. Pleural effusion on left - unknown if from pneumonia, pleurisy, new onset CHF, PE - needs chest CT and labs - check d-dimer and bmp, start lasix for with K 20  13. CAP (community acquired pneumonia) - ceftriaxone 1 gm IM x 1 in office today. Start levaquin tonight. Reviewed risk/side effects of abx.    Orders Placed This Encounter  Procedures  . DG Chest 2 View    Standing Status:   Future    Number of Occurrences:   1    Standing Expiration Date:   12/27/2016    Order Specific Question:   Reason for Exam (SYMPTOM  OR DIAGNOSIS REQUIRED)    Answer:   worsening shortness of breath for >3 mos    Order Specific Question:   Is the patient pregnant?    Answer:   No    Order Specific Question:   Preferred imaging location?    Answer:   External  . CT ANGIO CHEST PE W OR WO CONTRAST     Standing Status:   Future    Standing Expiration Date:   03/30/2017    Order Specific Question:   If indicated for the ordered procedure, I authorize the administration of contrast media per Radiology protocol    Answer:   Yes    Order Specific Question:   Reason for Exam (SYMPTOM  OR DIAGNOSIS REQUIRED)    Answer:   worsening shortness of breath and cough x 3 mos wiht left pleural effusion    Order Specific Question:   Is patient pregnant?    Answer:   No    Order Specific Question:   Preferred imaging location?    Answer:   [facility]  . Comprehensive metabolic panel  . Brain natriuretic peptide  . D-dimer, quantitative (  not at [facility])  . POCT CBC  . POCT SEDIMENTATION RATE  . POCT glucose (manual entry)  . EKG 12-Lead    Meds ordered this encounter  Medications  . ipratropium (ATROVENT) nebulizer solution 0.5 mg  . levofloxacin (LEVAQUIN) 750 MG tablet    Sig: Take 1 tablet (750 mg total) by mouth daily.    Dispense:  7 tablet    Refill:  0  . cefTRIAXone (ROCEPHIN) injection 1 g  . furosemide (LASIX) 40 MG tablet    Sig: Take 1 tablet (40 mg total) by mouth daily.    Dispense:  30 tablet    Refill:  0  . potassium chloride SA (K-DUR,KLOR-CON) 20 MEQ tablet    Sig: Take 1 tablet (20 mEq total) by mouth daily. With the lasix    Dispense:  30 tablet    Refill:  0   Over 40 min spent in face-to-face evaluation of and consultation with patient and coordination of care.  Over 50% of this time was spent counseling this patient.  I personally performed the services described in this documentation, which was scribed in my presence. The recorded information has been reviewed and considered, and addended by me as needed.   Norberto Sorenson, M.D.  [facility] [address] [place], [place] [address] [phone] phone [phone] fax  12/29/15 8:12 AM  Results for orders placed or performed in visit on 12/28/15  Comprehensive metabolic panel   Result Value Ref Range   Sodium 137 135 - 146 mmol/L   Potassium 4.7 3.5 - 5.3 mmol/L   Chloride 99 98 - 110 mmol/L   CO2 28 20 - 31 mmol/L   Glucose, Bld 168 (H) 65 - 99 mg/dL   BUN 16 7 - 25 mg/dL   Creat 1.30 8.65 - 7.84 mg/dL   Total Bilirubin 0.5 0.2 - 1.2 mg/dL   Alkaline Phosphatase 99 33 - 130 U/L   AST 10 10 - 35 U/L   ALT 6 6 - 29 U/L   Total Protein 6.6 6.1 - 8.1 g/dL   Albumin 3.6 3.6 - 5.1 g/dL   Calcium 9.0 8.6 - 69.6 mg/dL  Brain natriuretic peptide  Result Value Ref Range   Brain Natriuretic Peptide  <100 pg/mL  D-dimer, quantitative (not at [facility])  Result Value Ref Range   D-Dimer, Quant 2.44 (H) <0.50 mcg/mL FEU  POCT CBC  Result Value Ref Range   WBC 10.9 (A) 4.6 - 10.2 K/uL   Lymph, poc 2.5 0.6 - 3.4   POC LYMPH PERCENT 23.0 10 - 50 %L   MID (cbc) 0.7 0 - 0.9   POC MID % 6.5 0 - 12 %M   POC Granulocyte 7.7 (A) 2 - 6.9   Granulocyte percent 70.5 37 - 80 %G   RBC 4.72 4.04 - 5.48 M/uL   Hemoglobin 10.8 (A) 12.2 - 16.2 g/dL   HCT, POC 29.5 (A) 28.4 - 47.9 %   MCV 70.5 (A) 80 - 97 fL   MCH, POC 22.8 (A) 27 - 31.2 pg   MCHC 32.4 31.8 - 35.4 g/dL   RDW, POC 13.2 %   Platelet Count, POC 328 142 - 424 K/uL   MPV 6.8 0 - 99.8 fL  POCT SEDIMENTATION RATE  Result Value Ref Range   POCT SED RATE 38 (A) 0 - 22 mm/hr  POCT glucose (manual entry)  Result Value Ref Range   POC Glucose 196 (A) 70 -  99 mg/dl

## 2015-12-29 NOTE — ED Notes (Signed)
Respiratory called to admin medications that are now at bedside

## 2015-12-29 NOTE — H&P (Signed)
[facility] History and Physical Service Pager: [phone]  Patient name: Kaitlin BlalockDonna R Tooker Medical record number: [id number] Date of birth: 11/21/1955 Age: 60 y.o. Gender: female  Primary Care Provider: Norberto SorensonSHAW,EVA, MD Consultants: cardiology Code Status: FULL  Chief Complaint: shortness of breath  Assessment and Plan: Kaitlin Cantrell is a [age] y.o. female presenting with shortness of breath found to have a pulmonary embolism. PMH is significant for COPD, tobacco use, DM, HTN, Arthritis, HLD, chronic pain syndrome.  Shortness of breath:  Newly found submassive PE on CT angio which likely explains the majority of her symptoms. Hemodynamically stable with mild respiratory symptoms of SOB, and given this she is not a candidate for emergent thrombectomy. CT showed concerns for right heart strain, initial troponin 0.05 likely due to strain. Must also consider pneumonia vs COPD exacerbation. WBC 9.5, afebrile making infectious process less likley. CXR on 7/17 to work up cough and SOB showed questionable left lower lobe infiltrate which increased on followup CXR on 8/28 to a small to moderate sized left pleural effusion.  She denies fever, worsening cough or sputum production making active COPD especially in the setting of recent treatent in 10/2015 for COPD exacerbation. Risk factors for thrombo-embolus include prolonged history of smoking with no recent screening cancer imaging, but no recent travel, or exogenous hormones.  Colonoscopy 2013 normal, last pap 2012 negative for intraepithelial neoplasia no HPV component. PE likely contributed to by tobacco abuse, however as she is due for several cancer screening tests, cannot exclude malignancy especially as tobacco in and of itself predisposes to cancer -[facility] -repeat EKG in am -trend trops -therapeutic lovenox per pharm - Echocardiogram to evaluate cardiac effect of PE -CBC, BMP in am -O2  as necessary -albuterol PRN  - lasix 40 mg IV x1, assess I/O, consider repeat CXR   COPD: Stable, no evidence of COPD exacerbation. Pt uses symbicort, atrovent, dulera at home.  -O2 as needed, not on home O2 -xopenex , atrovent, dulera  HTN: Mildly hypertensive, 156/96 -losartan and HCTZ, rather than home losartan-HCTZ  HLD- Last LDL 87 on 10/01/2015 -continue home lovastatin  DM: On glimepiride, metformin at home. HgA1C 7.1 05/2015. -SSI ACHS  GERD: stable  -pepcid ordered  Arthritis/Chronic pain syndrome: History of back surgery. Tramadol, Norco at home. On review of [facility] data base last received Norco 7.5-325 # 90 on 12/04/2015 and tramadol 50 mg # 240 on 8/4//2017 -Continued home norco, gabapentin, flexeril, tramadol  Tobacco abuse: Pt smokes 0.5-1.0 packs per day.  -nicotine patch -smoking cessation counseling  FEN/GI: heart healthy diet, home pepcid, SLIV Prophylaxis: lovenox treatment  Disposition[facility] tele[facility]  History of Present Illness:  Kaitlin BlalockDonna R Hendler is a 60 y.o. female presenting with shortness of breath and generally feeling unwell since June. She was first seen in early June at urgent care for abdominal pain and was sent home with Golytely, probiotics. Followed up with her PCP, Dr. Clelia CroftShaw, on 7/17 for cough and feeling unwell and treated for COPD exacerbation. She reports she continued to feel unwell with worsening SOB over the last week but especially since Thursday 8/24. She has been unable to perform her usual activities due to shortness of breath and has been laying in bed primarily. She denies chest pain at any time during this period. She denies fever, worsening cough or sputum production. She denies nausea/emesis or diarrhea.  She again followed with her PCP at which time a d  dimer was obtained and was elevated to 2.44. In the Kaitlin CT angio is significant for a PE with evidence of right heart strain.  Of note she is a 0.5-1 ppd smoker. Otherwise  she denies recent travel, no recent surgery, no med changes. Denies N, V, D, or dysuria. No personal history of clots, no personal history of cancer.    Review Of Systems: Per HPI with the following additions: none Otherwise the remainder of the systems were negative.  Patient Active Problem List   Diagnosis Date Noted  . Pulmonary embolism (HCC) 12/29/2015  . Arthritis 10/01/2015  . Hyperlipidemia 10/01/2015  . Chronic pain syndrome 10/01/2015  . Chronic obstructive pulmonary disease (HCC) 10/01/2015  . Tobacco use disorder 10/01/2015  . Diabetes mellitus, type II (HCC) 03/30/2012  . Polypharmacy 03/30/2012  . Osteoarthritis of left knee 02/17/2012    Class: Diagnosis of  . HTN (hypertension) 05/10/2011    Past Medical History: Past Medical History:  Diagnosis Date  . Allergy    seasonal  . Arthritis   . Diabetes mellitus   . GERD (gastroesophageal reflux disease)   . History of pneumonia    15 years ago  . Hyperlipidemia   . Hypertension     Past Surgical History: Past Surgical History:  Procedure Laterality Date  . ANKLE FRACTURE SURGERY  1974   left and pinned then pins removed  . ANKLE GANGLION CYST EXCISION  1994   right  . BACK SURGERY  1998   discectomy and then fusion  . KNEE ARTHROPLASTY  02/14/2012   Procedure: COMPUTER ASSISTED TOTAL KNEE ARTHROPLASTY;  Surgeon: Cammy Copa, MD;  Location: [facility] OR;  Service: Orthopedics;  Laterality: Left;  Left total knee arthroplasty  . TONSILLECTOMY  1972  . TUBAL LIGATION  1996  . UMBILICAL HERNIA REPAIR  1996    Social History: Social History  Substance Use Topics  . Smoking status: Current Every Day Smoker    Packs/day: 1.00    Years: 40.00    Types: Cigarettes  . Smokeless tobacco: Never Used  . Alcohol use No   Additional social history: none Please also refer to relevant sections of EMR.  Family History: Family History  Problem Relation Age of Onset  . Colon polyps Father   . Diabetes  Father     mother  . Heart disease Father   . Heart murmur Mother   . Diabetes Mother   . Hypertension Mother   . Diabetes Brother   . Cancer Maternal Grandmother   . Diabetes Paternal Grandmother   . Heart disease Paternal Grandmother   . COPD Paternal Grandfather   . Heart disease Paternal Grandfather   . Hypertension Brother   . Hypertension Brother     Allergies and Medications: Allergies  Allergen Reactions  . Albuterol     Throat/lips swelling  . Morphine And Related Other (See Comments)    Very aggressive and doesn't help pain   No current facility-administered medications on file prior to encounter.    Current Outpatient Prescriptions on File Prior to Encounter  Medication Sig Dispense Refill  . budesonide-formoterol (SYMBICORT) 160-4.5 MCG/ACT inhaler Inhale 1 puff into the lungs 2 (two) times daily. 1 Inhaler 12  . cyclobenzaprine (FLEXERIL) 10 MG tablet TAKE ONE TABLET BY MOUTH THREE TIMES DAILY AS NEEDED FOR MUSCLE SPASMS 270 tablet 1  . diazepam (VALIUM) 5 MG tablet TAKE ONE TABLET BY MOUTH AT BEDTIME AND  MAY  REPEAT  DOSE  ONE  TIME  IF  NEEDED 180 tablet 1  . gabapentin (NEURONTIN) 300 MG capsule Take 1 capsule (300 mg total) by mouth 3 (three) times daily. Titrate up slowely 90 capsule 3  . glimepiride (AMARYL) 2 MG tablet TAKE ONE TABLET BY MOUTH TWICE DAILY 180 tablet 0  . ipratropium (ATROVENT HFA) 17 MCG/ACT inhaler Inhale 2 puffs into the lungs 4 (four) times daily. 1 Inhaler 12  . Krill Oil CAPS Take 1 capsule by mouth daily.     Marland Kitchen losartan-hydrochlorothiazide (HYZAAR) 100-25 MG tablet Take 1 tablet by mouth daily. 90 tablet 1  . lovastatin (MEVACOR) 20 MG tablet TAKE 2 TABLETS BY MOUTH DAILY AT 6PM 180 tablet 0  . metFORMIN (GLUCOPHAGE) 1000 MG tablet TAKE ONE TABLET BY MOUTH TWICE DAILY WITH  MEALS. 180 tablet 0  . omeprazole (PRILOSEC) 20 MG capsule Take 1 capsule (20 mg total) by mouth daily. 1 capsule 0  . potassium chloride SA (K-DUR,KLOR-CON) 20  MEQ tablet Take 1 tablet (20 mEq total) by mouth daily. With the lasix 30 tablet 0  . ranitidine (ZANTAC) 150 MG tablet TAKE ONE TABLET BY MOUTH TWICE DAILY. 180 tablet 0  . azelastine (OPTIVAR) 0.05 % ophthalmic solution Place 1 drop into both eyes 2 (two) times daily. (Patient not taking: Reported on 12/29/2015) 6 mL 12  . clotrimazole-betamethasone (LOTRISONE) cream Apply 1 application topically 2 (two) times daily. (Patient not taking: Reported on 12/29/2015) 45 g 2  . furosemide (LASIX) 40 MG tablet Take 1 tablet (40 mg total) by mouth daily. (Patient not taking: Reported on 12/29/2015) 30 tablet 0  . levofloxacin (LEVAQUIN) 750 MG tablet Take 1 tablet (750 mg total) by mouth daily. 7 tablet 0  . metoCLOPramide (REGLAN) 10 MG tablet Take 1 tablet (10 mg total) by mouth 2 (two) times daily. (Patient not taking: Reported on 12/29/2015) 10 tablet 0  . polyethylene glycol-electrolytes (NULYTELY/GOLYTELY) 420 g solution Take 4,000 mLs by mouth once. (Patient not taking: Reported on 12/29/2015) 4000 mL 0    Objective: BP 143/79   Pulse 108   Temp 98.3 F (36.8 C) (Oral)   Resp 13   Ht 5\' 6"  (1.676 m)   Wt 226 lb (102.5 kg)   SpO2 97%   BMI 36.48 kg/m  Exam: General: NAD, mildly diaphoretic Eyes: PEERLA, EOMI ENTM: moist mucous membranes Cardiovascular: rrr, no m/r/g Respiratory: diffuse coarse breath sounds and scattered wheezes, normal WOB, good air movement throughout Abdomen: obese, soft, non tender, non distended, +BS Skin: no rashes or wounds Extremities: no LE edema, tenderness or lesions Neuro: CN II-XII grossly intact, strength 5/5 all extremities Psych: mood and affect appropriate  Labs and Imaging: CBC BMET   Recent Labs Lab 12/29/15 0950  WBC 9.5  HGB 10.2*  HCT 36.2  PLT 315    Recent Labs Lab 12/29/15 0950  NA 136  K 4.0  CL 100*  CO2 25  BUN 15  CREATININE 1.03*  GLUCOSE 207*  CALCIUM 9.0     Dg Chest 2 View  Result Date: 12/28/2015 CLINICAL DATA:   Increasing shortness of breath over the past 3 months EXAM: CHEST  2 VIEW COMPARISON:  PA and lateral chest x-ray of November 16, 2015 FINDINGS: There is a small to moderate size left pleural effusion which has increased significantly since the previous study. There is no right pleural effusion. No discrete infiltrate is observed though certainly left basilar atelectasis or infiltrate may be present. The cardiac silhouette is enlarged. The pulmonary vascularity is not  engorged. The mediastinum is normal in width. The bony thorax exhibits no acute abnormality. IMPRESSION: Increased size of the left pleural effusion small to moderate in volume. Cardiomegaly without pulmonary edema. Electronically Signed   By: David  [place] M.D.   On: 12/28/2015 15:15   Ct Angio Chest Pe W And/or Wo Contrast  Result Date: 12/29/2015 CLINICAL DATA:  Increasing shortness of Breath. Question pulmonary embolus. EXAM: CT ANGIOGRAPHY CHEST WITH CONTRAST TECHNIQUE: Multidetector CT imaging of the chest was performed using the standard protocol during bolus administration of intravenous contrast. Multiplanar CT image reconstructions and MIPs were obtained to evaluate the vascular anatomy. CONTRAST:  Contrast 100 cc Isovue 370 COMPARISON:  None. FINDINGS: Cardiovascular: Heart is enlarged. No pericardial effusion. No thoracic aortic aneurysm. Acute pulmonary embolus is identified in segmental and subsegmental pulmonary arteries to the right upper lobe, right middle lobe, and both lower lobes. RV/LV ratios difficult to assess given the lack of contrast on left heart, but the ratio is probably approaching 1.0. Mediastinum/Nodes: 10 mm short axis prevascular lymph node is upper normal to borderline increased. No other mediastinal lymphadenopathy is evident. There is no hilar lymphadenopathy. There is no axillary lymphadenopathy. Lungs/Pleura: Assessment of lung parenchyma degraded by patient breathing motion during image acquisition. Small  wedge-shaped peripheral defect posterior right lower lobe may be related atelectasis or infarct. There is left base collapse/ consolidation with small to moderate left pleural effusion. Upper Abdomen: Unremarkable. Musculoskeletal: Convex rightward lumbar scoliosis noted. Bone windows reveal no worrisome lytic or sclerotic osseous lesions. Review of the MIP images confirms the above findings. IMPRESSION: 1. Positive for acute PE with CT evidence of right heart strain (RV/LV Ratio = 1.0) consistent with at least submassive (intermediate risk) PE. The presence of right heart strain has been associated with an increased risk of morbidity and mortality. Please activate Code PE by paging [phone]. Critical Value/emergent results were called by telephone at the time of interpretation on 12/29/2015 at 12:06 pm to Dr. Jacalyn Lefevre , who verbally acknowledged these results. Electronically Signed   By: Kennith Center M.D.   On: 12/29/2015 12:06    Loni Muse, MD 12/29/2015, 12:47 PM PGY-1, [facility] FPTS Intern pager: [phone], text pages welcome   I have seen and examined the patient. I have read and agree with the above note. My changes are noted in blue.  Alyssa A. Kennon Rounds MD, MS Family Medicine Resident PGY-3 Pager [phone]

## 2015-12-29 NOTE — Progress Notes (Deleted)
By signing my name below, I, Mesha Guinyard, attest that this documentation has been prepared under the direction and in the presence of Norberto Sorenson.  Electronically Signed: Arvilla Market, Medical Scribe. 12/29/15. 2:51 PM.  Subjective:    Patient ID: Kaitlin Cantrell, female    DOB: 1955-09-12, 60 y.o.   MRN: [id number]  HPI Chief Complaint  Patient presents with  . Follow-up    for pneumonia     HPI Comments: Kaitlin Cantrell is a 60 y.o. female who presents to the [facility] for PNA follow-up. Pt was treated with doxycycline 6 weeks prior, and a prednisone injection followed by azithromycin with 40 mg DepoMedrol. Pt mentions her symptoms got better, but then went on a decline 4 days ago. Pt gets post nasal drip with thin clear sputum when she lays down Pt reports bilateral (L>R) leg edema, episodic palpitations, and light-headedness with coughing, wheezing, and SOB. Pt states she can't walk too far without getting SOB. Pt states she didn't do anything over the weekend due to her SOB. Pt is still doing Symbicort that helps temporarily, and her Atrovent inhaler about 2-3 times a day. Pt recently threw away her nebulizer at home since it was old and wasn't been using it.. Pt smokes about .5 pack, but states this is less than usual. Pt denies fevers, and chills.   Patient Active Problem List   Diagnosis Date Noted  . Arthritis 10/01/2015  . Hyperlipidemia 10/01/2015  . Chronic pain syndrome 10/01/2015  . Chronic obstructive pulmonary disease (HCC) 10/01/2015  . Tobacco use disorder 10/01/2015  . Diabetes mellitus, type II (HCC) 03/30/2012  . Polypharmacy 03/30/2012  . Osteoarthritis of left knee 02/17/2012    Class: Diagnosis of  . HTN (hypertension) 05/10/2011   Past Medical History:  Diagnosis Date  . Allergy    seasonal  . Arthritis   . Diabetes mellitus   . GERD (gastroesophageal reflux disease)   . History of pneumonia    15 years ago  .  Hyperlipidemia   . Hypertension    Past Surgical History:  Procedure Laterality Date  . ANKLE FRACTURE SURGERY  1974   left and pinned then pins removed  . ANKLE GANGLION CYST EXCISION  1994   right  . BACK SURGERY  1998   discectomy and then fusion  . KNEE ARTHROPLASTY  02/14/2012   Procedure: COMPUTER ASSISTED TOTAL KNEE ARTHROPLASTY;  Surgeon: Cammy Copa, MD;  [facility];  Service: Orthopedics;  Laterality: Left;  Left total knee arthroplasty  . TONSILLECTOMY  1972  . TUBAL LIGATION  1996  . UMBILICAL HERNIA REPAIR  1996   Allergies  Allergen Reactions  . Albuterol     Throat/lips swelling  . Morphine And Related Other (See Comments)    Very aggressive and doesn't help pain   Prior to Admission medications   Medication Sig Start Date End Date Taking? Authorizing Provider  aspirin EC 81 MG tablet Take 81 mg by mouth daily.   Yes Historical Provider, MD  azelastine (OPTIVAR) 0.05 % ophthalmic solution Place 1 drop into both eyes 2 (two) times daily. 09/05/14  Yes Sherren Mocha, MD  budesonide-formoterol (SYMBICORT) 160-4.5 MCG/ACT inhaler Inhale 1 puff into the lungs 2 (two) times daily. 10/01/15  Yes Sherren Mocha, MD  clotrimazole-betamethasone (LOTRISONE) cream Apply 1 application topically 2 (two) times daily. 10/01/15  Yes Sherren Mocha, MD  cyclobenzaprine (FLEXERIL) 10 MG tablet TAKE ONE TABLET BY MOUTH  THREE TIMES DAILY AS NEEDED FOR MUSCLE SPASMS 10/01/15  Yes Sherren Mocha, MD  diazepam (VALIUM) 5 MG tablet TAKE ONE TABLET BY MOUTH AT BEDTIME AND  MAY  REPEAT  DOSE  ONE  TIME  IF  NEEDED 10/01/15  Yes Sherren Mocha, MD  gabapentin (NEURONTIN) 300 MG capsule Take 1 capsule (300 mg total) by mouth 3 (three) times daily. Titrate up slowely 10/01/15  Yes Sherren Mocha, MD  glimepiride (AMARYL) 2 MG tablet TAKE ONE TABLET BY MOUTH TWICE DAILY 12/22/15  Yes Sherren Mocha, MD  ipratropium (ATROVENT HFA) 17 MCG/ACT inhaler Inhale 2 puffs into the lungs 4 (four) times daily. 11/16/15  Yes Sherren Mocha,  MD  [facility] CAPS Take 1 capsule by mouth 2 (two) times daily.    Yes Historical Provider, MD  losartan-hydrochlorothiazide (HYZAAR) 100-25 MG tablet Take 1 tablet by mouth daily. 10/01/15  Yes Sherren Mocha, MD  lovastatin (MEVACOR) 20 MG tablet TAKE 2 TABLETS BY MOUTH DAILY AT 6PM 12/22/15  Yes Sherren Mocha, MD  metFORMIN (GLUCOPHAGE) 1000 MG tablet TAKE ONE TABLET BY MOUTH TWICE DAILY WITH  MEALS. 12/22/15  Yes Sherren Mocha, MD  metoCLOPramide (REGLAN) 10 MG tablet Take 1 tablet (10 mg total) by mouth 2 (two) times daily. 10/23/15  Yes Domenick Gong, MD  omeprazole (PRILOSEC) 20 MG capsule Take 1 capsule (20 mg total) by mouth daily. 09/07/12  Yes Sherren Mocha, MD  polyethylene glycol-electrolytes (NULYTELY/GOLYTELY) 420 g solution Take 4,000 mLs by mouth once. 10/23/15  Yes Domenick Gong, MD  ranitidine (ZANTAC) 150 MG tablet TAKE ONE TABLET BY MOUTH TWICE DAILY. 09/11/15  Yes Sherren Mocha, MD   Social History   Social History  . Marital status: Married    Spouse name: N/A  . Number of children: 0  . Years of education: N/A   Occupational History  . retired    Social History Main Topics  . Smoking status: Current Every Day Smoker    Packs/day: 1.00    Years: 40.00    Types: Cigarettes  . Smokeless tobacco: Never Used  . Alcohol use No  . Drug use: No  . Sexual activity: Not on file   Other Topics Concern  . Not on file   Social History Narrative   Exercise walking daily (varies)   Depression screen [facility] 2/9 12/28/2015 10/01/2015 09/05/2014  Decreased Interest 0 0 0  Down, Depressed, Hopeless 0 1 0  PHQ - 2 Score 0 1 0   Review of Systems  Constitutional: Positive for activity change, appetite change, diaphoresis and fatigue. Negative for chills and fever.  HENT: Negative for congestion, postnasal drip, rhinorrhea, sinus pressure and trouble swallowing.   Respiratory: Positive for cough, chest tightness, shortness of breath and wheezing.   Cardiovascular: Positive for palpitations and  leg swelling. Negative for chest pain.  Gastrointestinal: Negative for abdominal pain.  Musculoskeletal: Positive for arthralgias and back pain. Negative for gait problem.  Neurological: Positive for weakness and light-headedness. Negative for syncope and speech difficulty.  Psychiatric/Behavioral: Positive for sleep disturbance.   Objective:  Physical Exam  Constitutional: She appears well-developed and well-nourished. No distress.  HENT:  Head: Normocephalic and atraumatic.  Eyes: Conjunctivae are normal.  Neck: Neck supple.  Cardiovascular: Regular rhythm.  Tachycardia present.   Pulses:      Dorsalis pedis pulses are 2+ on the right side, and 2+ on the left side.  Pulmonary/Chest: Effort normal. She has rhonchi (inspiratory  and expiatory).  Dec air movement throughout  Musculoskeletal:  2+ pitting edema to the knee  Lymphadenopathy:    She has cervical adenopathy (bilterally).  Neurological: She is alert.  Skin: Skin is warm and dry.  Mild to moderate with varicose Clamy skin  Psychiatric: She has a normal mood and affect. Her behavior is normal.  Nursing note and vitals reviewed.  BP 124/80   Pulse (!) 117   Temp 98.1 F (36.7 C) (Oral)   Resp 20   Ht 5\' 6"  (1.676 m)   Wt 226 lb 6.4 oz (102.7 kg)   SpO2 98%   BMI 36.54 kg/m    Results for orders placed or performed in visit on 12/28/15  Comprehensive metabolic panel  Result Value Ref Range   Sodium 137 135 - 146 mmol/L   Potassium 4.7 3.5 - 5.3 mmol/L   Chloride 99 98 - 110 mmol/L   CO2 28 20 - 31 mmol/L   Glucose, Bld 168 (H) 65 - 99 mg/dL   BUN 16 7 - 25 mg/dL   Creat 4.541.05 0.980.50 - 1.191.05 mg/dL   Total Bilirubin 0.5 0.2 - 1.2 mg/dL   Alkaline Phosphatase 99 33 - 130 U/L   AST 10 10 - 35 U/L   ALT 6 6 - 29 U/L   Total Protein 6.6 6.1 - 8.1 g/dL   Albumin 3.6 3.6 - 5.1 g/dL   Calcium 9.0 8.6 - 14.710.4 mg/dL  Brain natriuretic peptide  Result Value Ref Range   Brain Natriuretic Peptide  <100 pg/mL  D-dimer,  quantitative (not at [facility])  Result Value Ref Range   D-Dimer, Quant 2.44 (H) <0.50 mcg/mL FEU  POCT CBC  Result Value Ref Range   WBC 10.9 (A) 4.6 - 10.2 K/uL   Lymph, poc 2.5 0.6 - 3.4   POC LYMPH PERCENT 23.0 10 - 50 %L   MID (cbc) 0.7 0 - 0.9   POC MID % 6.5 0 - 12 %M   POC Granulocyte 7.7 (A) 2 - 6.9   Granulocyte percent 70.5 37 - 80 %G   RBC 4.72 4.04 - 5.48 M/uL   Hemoglobin 10.8 (A) 12.2 - 16.2 g/dL   HCT, POC 82.933.3 (A) 56.237.7 - 47.9 %   MCV 70.5 (A) 80 - 97 fL   MCH, POC 22.8 (A) 27 - 31.2 pg   MCHC 32.4 31.8 - 35.4 g/dL   RDW, POC 13.017.8 %   Platelet Count, POC 328 142 - 424 K/uL   MPV 6.8 0 - 99.8 fL  POCT SEDIMENTATION RATE  Result Value Ref Range   POCT SED RATE 38 (A) 0 - 22 mm/hr  POCT glucose (manual entry)  Result Value Ref Range   POC Glucose 196 (A) 70 - 99 mg/dl   Dg Chest 2 View  Result Date: 12/28/2015 CLINICAL DATA:  Increasing shortness of breath over the past 3 months EXAM: CHEST  2 VIEW COMPARISON:  PA and lateral chest x-ray of November 16, 2015 FINDINGS: There is a small to moderate size left pleural effusion which has increased significantly since the previous study. There is no right pleural effusion. No discrete infiltrate is observed though certainly left basilar atelectasis or infiltrate may be present. The cardiac silhouette is enlarged. The pulmonary vascularity is not engorged. The mediastinum is normal in width. The bony thorax exhibits no acute abnormality. IMPRESSION: Increased size of the left pleural effusion small to moderate in volume. Cardiomegaly without pulmonary edema. Electronically Signed   By:  David  [place] M.D.   On: 12/28/2015 15:15   [EKG Reading]: Sinus  Tachycardia  - frequent multiform ectopic ventricular beats  # VECs = 3, # types 2 -Left axis -anterior fascicular block.   -Left atrial enlargement.   -Old anterior infarct.   -  Nonspecific T-abnormality.   Low voltage with rightward P-axis and rotation -possible pulmonary disease.     Lost R waves in aVR and AVF since prior EKGs on 02/07/12.  Medial chest leads with elevated J point and ST seg   [4:02 PM]: Pt's O2 96% with her heart rate 110-115  Assessment & Plan:   1. Shortness of breath   2. Cough   3. Tachycardia - gave about 250 cc NS IVF in office w/o improvement in HR  4. Other fatigue   5. Type 2 diabetes mellitus without complication, without long-term current use of insulin (HCC)   6. Tobacco use disorder - still smoking 1/2 ppd though sig decreased from prior per pt.  7. Polypharmacy   8. Chronic pain syndrome   9. Essential hypertension   10. Chronic obstructive pulmonary disease, unspecified COPD type (HCC) - O2 sat nml, allergic to albuterol with angioedema but cont symbicort bid and increase atrovent use to scheduled.  May need to start po course of prednisone but will defer until we get further info that her sxs could be due to a COPD exac.  Pt reports her cbgs minimally elev with IM dose of DepoMedrol 40 last mo.  11. Pedal edema - elevate legs, will do d-dimer to r/o DVT/PE as etiology of sxs.  D-dimer + so send for stat chest CTA, start lasix 40 with K 20.  12. Pleural effusion on left - unknown if from pneumonia, pleurisy, new onset CHF, PE - needs chest CT and labs - check d-dimer and bmp, start lasix for with K 20  13. CAP (community acquired pneumonia) - ceftriaxone 1 gm IM x 1 in office today. Start levaquin tonight. Reviewed risk/side effects of abx.    Orders Placed This Encounter  Procedures  . DG Chest 2 View    Standing Status:   Future    Number of Occurrences:   1    Standing Expiration Date:   12/27/2016    Order Specific Question:   Reason for Exam (SYMPTOM  OR DIAGNOSIS REQUIRED)    Answer:   worsening shortness of breath for >3 mos    Order Specific Question:   Is the patient pregnant?    Answer:   No    Order Specific Question:   Preferred imaging location?    Answer:   External  . CT ANGIO CHEST PE W OR WO CONTRAST     Standing Status:   Future    Standing Expiration Date:   03/30/2017    Order Specific Question:   If indicated for the ordered procedure, I authorize the administration of contrast media per Radiology protocol    Answer:   Yes    Order Specific Question:   Reason for Exam (SYMPTOM  OR DIAGNOSIS REQUIRED)    Answer:   worsening shortness of breath and cough x 3 mos wiht left pleural effusion    Order Specific Question:   Is patient pregnant?    Answer:   No    Order Specific Question:   Preferred imaging location?    Answer:   [facility]  . Comprehensive metabolic panel  . Brain natriuretic peptide  . D-dimer, quantitative (  not at [facility])  . POCT CBC  . POCT SEDIMENTATION RATE  . POCT glucose (manual entry)  . EKG 12-Lead    Meds ordered this encounter  Medications  . ipratropium (ATROVENT) nebulizer solution 0.5 mg  . levofloxacin (LEVAQUIN) 750 MG tablet    Sig: Take 1 tablet (750 mg total) by mouth daily.    Dispense:  7 tablet    Refill:  0  . cefTRIAXone (ROCEPHIN) injection 1 g  . furosemide (LASIX) 40 MG tablet    Sig: Take 1 tablet (40 mg total) by mouth daily.    Dispense:  30 tablet    Refill:  0  . potassium chloride SA (K-DUR,KLOR-CON) 20 MEQ tablet    Sig: Take 1 tablet (20 mEq total) by mouth daily. With the lasix    Dispense:  30 tablet    Refill:  0   Over 40 min spent in face-to-face evaluation of and consultation with patient and coordination of care.  Over 50% of this time was spent counseling this patient.  I personally performed the services described in this documentation, which was scribed in my presence. The recorded information has been reviewed and considered, and addended by me as needed.   Norberto Sorenson, M.D.  [facility] [address] [place], [place] [address] [phone] phone [phone] fax  12/29/15 8:06 AM  Results for orders placed or performed in visit on 12/28/15  Comprehensive metabolic panel   Result Value Ref Range   Sodium 137 135 - 146 mmol/L   Potassium 4.7 3.5 - 5.3 mmol/L   Chloride 99 98 - 110 mmol/L   CO2 28 20 - 31 mmol/L   Glucose, Bld 168 (H) 65 - 99 mg/dL   BUN 16 7 - 25 mg/dL   Creat 7.32 2.02 - 5.42 mg/dL   Total Bilirubin 0.5 0.2 - 1.2 mg/dL   Alkaline Phosphatase 99 33 - 130 U/L   AST 10 10 - 35 U/L   ALT 6 6 - 29 U/L   Total Protein 6.6 6.1 - 8.1 g/dL   Albumin 3.6 3.6 - 5.1 g/dL   Calcium 9.0 8.6 - 70.6 mg/dL  Brain natriuretic peptide  Result Value Ref Range   Brain Natriuretic Peptide  <100 pg/mL  D-dimer, quantitative (not at [facility])  Result Value Ref Range   D-Dimer, Quant 2.44 (H) <0.50 mcg/mL FEU  POCT CBC  Result Value Ref Range   WBC 10.9 (A) 4.6 - 10.2 K/uL   Lymph, poc 2.5 0.6 - 3.4   POC LYMPH PERCENT 23.0 10 - 50 %L   MID (cbc) 0.7 0 - 0.9   POC MID % 6.5 0 - 12 %M   POC Granulocyte 7.7 (A) 2 - 6.9   Granulocyte percent 70.5 37 - 80 %G   RBC 4.72 4.04 - 5.48 M/uL   Hemoglobin 10.8 (A) 12.2 - 16.2 g/dL   HCT, POC 23.7 (A) 62.8 - 47.9 %   MCV 70.5 (A) 80 - 97 fL   MCH, POC 22.8 (A) 27 - 31.2 pg   MCHC 32.4 31.8 - 35.4 g/dL   RDW, POC 31.5 %   Platelet Count, POC 328 142 - 424 K/uL   MPV 6.8 0 - 99.8 fL  POCT SEDIMENTATION RATE  Result Value Ref Range   POCT SED RATE 38 (A) 0 - 22 mm/hr  POCT glucose (manual entry)  Result Value Ref Range   POC Glucose 196 (A) 70 -  99 mg/dl

## 2015-12-29 NOTE — ED Notes (Signed)
Paged PCCM to [phone] 

## 2015-12-30 ENCOUNTER — Encounter (HOSPITAL_COMMUNITY): Payer: Self-pay | Admitting: General Practice

## 2015-12-30 ENCOUNTER — Inpatient Hospital Stay (HOSPITAL_COMMUNITY): Payer: PPO

## 2015-12-30 ENCOUNTER — Other Ambulatory Visit: Payer: Self-pay

## 2015-12-30 DIAGNOSIS — I2699 Other pulmonary embolism without acute cor pulmonale: Secondary | ICD-10-CM

## 2015-12-30 DIAGNOSIS — J90 Pleural effusion, not elsewhere classified: Secondary | ICD-10-CM

## 2015-12-30 DIAGNOSIS — N179 Acute kidney failure, unspecified: Secondary | ICD-10-CM | POA: Diagnosis not present

## 2015-12-30 DIAGNOSIS — I420 Dilated cardiomyopathy: Secondary | ICD-10-CM | POA: Diagnosis not present

## 2015-12-30 DIAGNOSIS — R0602 Shortness of breath: Secondary | ICD-10-CM | POA: Diagnosis not present

## 2015-12-30 DIAGNOSIS — Z833 Family history of diabetes mellitus: Secondary | ICD-10-CM | POA: Diagnosis not present

## 2015-12-30 DIAGNOSIS — E1122 Type 2 diabetes mellitus with diabetic chronic kidney disease: Secondary | ICD-10-CM | POA: Diagnosis not present

## 2015-12-30 DIAGNOSIS — I2609 Other pulmonary embolism with acute cor pulmonale: Secondary | ICD-10-CM | POA: Diagnosis not present

## 2015-12-30 DIAGNOSIS — J441 Chronic obstructive pulmonary disease with (acute) exacerbation: Secondary | ICD-10-CM | POA: Diagnosis not present

## 2015-12-30 DIAGNOSIS — N183 Chronic kidney disease, stage 3 (moderate): Secondary | ICD-10-CM | POA: Diagnosis not present

## 2015-12-30 DIAGNOSIS — E785 Hyperlipidemia, unspecified: Secondary | ICD-10-CM | POA: Diagnosis not present

## 2015-12-30 DIAGNOSIS — Z7984 Long term (current) use of oral hypoglycemic drugs: Secondary | ICD-10-CM | POA: Diagnosis not present

## 2015-12-30 DIAGNOSIS — I130 Hypertensive heart and chronic kidney disease with heart failure and stage 1 through stage 4 chronic kidney disease, or unspecified chronic kidney disease: Secondary | ICD-10-CM | POA: Diagnosis not present

## 2015-12-30 DIAGNOSIS — I5043 Acute on chronic combined systolic (congestive) and diastolic (congestive) heart failure: Secondary | ICD-10-CM | POA: Diagnosis not present

## 2015-12-30 LAB — GLUCOSE, CAPILLARY
GLUCOSE-CAPILLARY: 245 mg/dL — AB (ref 65–99)
GLUCOSE-CAPILLARY: 224 mg/dL — AB (ref 65–99)
GLUCOSE-CAPILLARY: 188 mg/dL — AB (ref 65–99)
Glucose-Capillary: 217 mg/dL — ABNORMAL HIGH (ref 65–99)

## 2015-12-30 LAB — CBC
HCT: 37.1 % (ref 36.0–46.0)
HEMOGLOBIN: 10.7 g/dL — AB (ref 12.0–15.0)
MCH: 21.7 pg — AB (ref 26.0–34.0)
MCHC: 28.8 g/dL — ABNORMAL LOW (ref 30.0–36.0)
MCV: 75.3 fL — ABNORMAL LOW (ref 78.0–100.0)
Platelets: 358 10*3/uL (ref 150–400)
RBC: 4.93 MIL/uL (ref 3.87–5.11)
RDW: 16.9 % — ABNORMAL HIGH (ref 11.5–15.5)
WBC: 10.2 10*3/uL (ref 4.0–10.5)

## 2015-12-30 LAB — BASIC METABOLIC PANEL
ANION GAP: 11 (ref 5–15)
BUN: 19 mg/dL (ref 6–20)
CALCIUM: 9.4 mg/dL (ref 8.9–10.3)
CO2: 27 mmol/L (ref 22–32)
Chloride: 96 mmol/L — ABNORMAL LOW (ref 101–111)
Creatinine, Ser: 1.11 mg/dL — ABNORMAL HIGH (ref 0.44–1.00)
GFR, EST NON AFRICAN AMERICAN: 54 mL/min — AB (ref >60)
Glucose, Bld: 248 mg/dL — ABNORMAL HIGH (ref 65–99)
Potassium: 4 mmol/L (ref 3.5–5.1)
SODIUM: 134 mmol/L — AB (ref 135–145)

## 2015-12-30 LAB — TROPONIN I
TROPONIN I: 0.04 ng/mL — AB (ref <0.03)
TROPONIN I: 0.05 ng/mL — AB (ref <0.03)
TROPONIN I: 0.06 ng/mL — AB (ref <0.03)

## 2015-12-30 LAB — ECHOCARDIOGRAM COMPLETE
HEIGHTINCHES: 66 in
Weight: 3574.4 oz

## 2015-12-30 MED ORDER — PERFLUTREN LIPID MICROSPHERE
INTRAVENOUS | Status: AC
  Administered 2015-12-30: 2 mL via INTRAVENOUS
  Filled 2015-12-30: qty 10

## 2015-12-30 MED ORDER — PERFLUTREN LIPID MICROSPHERE
1.0000 mL | INTRAVENOUS | Status: AC | PRN
  Administered 2015-12-30: 2 mL via INTRAVENOUS
  Filled 2015-12-30: qty 10

## 2015-12-30 MED ORDER — FUROSEMIDE 10 MG/ML IJ SOLN
40.0000 mg | Freq: Once | INTRAMUSCULAR | Status: AC
  Administered 2015-12-30: 40 mg via INTRAVENOUS
  Filled 2015-12-30: qty 4

## 2015-12-30 MED ORDER — IOPAMIDOL (ISOVUE-370) INJECTION 76%
100.0000 mL | Freq: Once | INTRAVENOUS | Status: AC | PRN
  Administered 2015-12-29: 100 mL via INTRAVENOUS

## 2015-12-30 NOTE — Discharge Summary (Signed)
Family Medicine Teaching [facility] Discharge Summary  Patient name: Kaitlin Cantrell Medical record number: [id number] Date of birth: 09-01-1955 Age: 61 y.o. Gender: female Date of Admission: 12/29/2015  Date of Discharge: 01/05/2016 Admitting Physician: Uvaldo Rising, MD  Primary Care Provider: Norberto Sorenson, MD Consultants: Cardiology  Indication for Hospitalization: shortness of breath  Discharge Diagnoses/Problem List:  Patient Active Problem List   Diagnosis Date Noted  . Bilateral lower extremity edema   . Acute combined systolic and diastolic congestive heart failure (HCC)   . Cardiomyopathy (HCC)   . Pleural effusion   . Pulmonary embolism with acute cor pulmonale (HCC) 12/29/2015  . Arthritis 10/01/2015  . Hyperlipidemia 10/01/2015  . Chronic pain syndrome 10/01/2015  . Chronic obstructive pulmonary disease (HCC) 10/01/2015  . Tobacco abuse 10/01/2015  . Type 2 diabetes mellitus without complication, without long-term current use of insulin (HCC) 03/30/2012  . Polypharmacy 03/30/2012  . Osteoarthritis of left knee 02/17/2012    Class: Diagnosis of  . HTN (hypertension) 05/10/2011   Disposition: Home  Discharge Condition: Stable, improved  Discharge Exam: General: Pleasant elderly lady, in NAD, sitting on edge of bed.  Cardiovascular: rrr, no m/r/g Respiratory: CTAB, good air movement Abdomen: SNTND Skin: warm, dry, peeling on feet bilaterally, no peeling of hands or mucous membranes Extremities: +1 bilateral lower extremity edema, feet bilaterally with peeling and erythema, no increased warmth  Brief Hospital Course:   PE 60 year old female presented with shortness of breath to [facility] ED on 12/29/2015 found to have a newly submassive pulmonary embolism on CTA chest.  Hemodynamically stable on admission and not a candidate for emergent thrombectomy. For new unprovoked PE, was started on therapeutic Lovenox. She was then transitioned to oral Xarelto to complete  a course of 6 months of oral anticoagulation. Xarelto teaching was performed.  Heart failure CT showing concerns for right heart strain with initial troponin of 0.04.  Echo showing moderate LVH, EF 20-25% with diffuse hypokinesis, G3DD.  She was complaining of some pleurisy which resolved. CXR showing a moderate sized pleural effusion for which she was given Lasix.  Trops were trended and found to be persistently mildly elevated at 0.06. Cardiology was consulted and recommended IV lasix to diurese fluid from lungs and extremities. She was started on medications for heart failure including Entresto, Coreg, and Spironolactone. During diuresis, her creatinine increased from 1.05 at time of admission to 1.53. Diuretics were held and creatinine trended down to 1.22 at time of discharge. Cardiology recommended to discharge her on oral lasix 40 mg daily. Cardiology recommended SPEP to rule out amyloidosis as cause of heart failure. This test was normal. Will follow with heart failure clinic as an outpatient.  Fungal infection on feet At time of admission it was noted patient had swelling and peeling of her feet. On 9/4 she began to complain of pain in her feet bilaterally. Pharmacy was consulted as there was concern for a drug reaction, but this thought to be unlikely since the peeling was evident at time of admission. She was started on Lamisil cream for presumed fungal infection and discharged home with this medication as the redness and pain improved with usage of cream. Bilateral ABIs were obtained due to concern for PAD prior to discharge and were normal bilaterally.  Issues for Follow Up:  1. Patient to complete a total course of 6 months on Xarelto. Xarelto teaching has been performed on this admission. 2. Smoking cessation advised, to follow up with PCP for  possible Chantix to aid in quitting 3. AKI during admission likely due to diuresis, recommend re-checking BMET as outpatient 4. Feet rash resolution  with Lamisil cream, monitor for improvement 5. Obtain lipid panel as outpatient, per cardiology recommendations from hospitalization 6. Needs Right and Left Heart Cath as outpatient to evaluate for CAD after 6 months Xarelto  Significant Procedures: CTA chest, echo  Significant Labs and Imaging:   Recent Labs Lab 01/01/16 0222  WBC 10.5  HGB 10.4*  HCT 36.2  PLT 374    Recent Labs Lab 01/01/16 0222 01/02/16 0148 01/03/16 0319 01/04/16 0206 01/05/16 0733  NA 131* 133* 133* 131* 137  K 4.6 4.0 4.3 4.6 4.4  CL 91* 89* 90* 90* 98*  CO2 29 31 34* 33* 32  GLUCOSE 252* 191* 215* 240* 171*  BUN 25* 30* 37* 42* 31*  CREATININE 1.30* 1.31* 1.53* 1.51* 1.22*  CALCIUM 9.7 10.2 9.7 9.2 9.5   No results found. Echo 8/30 Study Conclusions - Left ventricle: The cavity size was mildly dilated. Wall   thickness was increased in a pattern of moderate LVH. Systolic   function was severely reduced. The estimated ejection fraction   was in the range of 20% to 25%. Diffuse hypokinesis. There was   fusion of early and atrial contributions to ventricular filling.   Doppler parameters are consistent with a reversible restrictive   pattern, indicative of decreased left ventricular diastolic   compliance and/or increased left atrial pressure (grade 3   diastolic dysfunction). - Mitral valve: There was moderate regurgitation. - Left atrium: The atrium was moderately dilated. - Right ventricle: The cavity size was mildly dilated. Systolic   function was mildly reduced. - Right atrium: The atrium was moderately dilated. - Tricuspid valve: There was moderate regurgitation. - Pulmonary arteries: Systolic pressure was moderately increased.   PA peak pressure: 54 mm Hg (S). - Pericardium, extracardiac: There was a left pleural effusion  Results/Tests Pending at Time of Discharge: None  Discharge Medications:    Medication List    STOP taking these medications   losartan-hydrochlorothiazide  100-25 MG tablet Commonly known as:  HYZAAR   metoCLOPramide 10 MG tablet Commonly known as:  REGLAN   polyethylene glycol-electrolytes 420 g solution Commonly known as:  NuLYTELY/GoLYTELY     TAKE these medications   azelastine 0.05 % ophthalmic solution Commonly known as:  OPTIVAR Place 1 drop into both eyes 2 (two) times daily.   budesonide-formoterol 160-4.5 MCG/ACT inhaler Commonly known as:  SYMBICORT Inhale 1 puff into the lungs 2 (two) times daily.   carvedilol 3.125 MG tablet Commonly known as:  COREG Take 1 tablet (3.125 mg total) by mouth 2 (two) times daily with a meal.   clotrimazole-betamethasone cream Commonly known as:  LOTRISONE Apply 1 application topically 2 (two) times daily.   cyclobenzaprine 10 MG tablet Commonly known as:  FLEXERIL TAKE ONE TABLET BY MOUTH THREE TIMES DAILY AS NEEDED FOR MUSCLE SPASMS   diazepam 5 MG tablet Commonly known as:  VALIUM TAKE ONE TABLET BY MOUTH AT BEDTIME AND  MAY  REPEAT  DOSE  ONE  TIME  IF  NEEDED   furosemide 40 MG tablet Commonly known as:  LASIX Take 1 tablet (40 mg total) by mouth daily. What changed:  Another medication with the same name was added. Make sure you understand how and when to take each.   furosemide 40 MG tablet Commonly known as:  LASIX Take 1 tablet (40 mg total) by mouth daily.  What changed:  You were already taking a medication with the same name, and this prescription was added. Make sure you understand how and when to take each.   gabapentin 300 MG capsule Commonly known as:  NEURONTIN Take 1 capsule (300 mg total) by mouth 3 (three) times daily. Titrate up slowely   glimepiride 2 MG tablet Commonly known as:  AMARYL TAKE ONE TABLET BY MOUTH TWICE DAILY   HYDROcodone-acetaminophen 7.5-325 MG tablet Commonly known as:  NORCO Take 1 tablet by mouth every 6 (six) hours as needed for pain.   ipratropium 17 MCG/ACT inhaler Commonly known as:  ATROVENT HFA Inhale 2 puffs into the  lungs 4 (four) times daily.   Krill Oil Caps Take 1 capsule by mouth daily.   levofloxacin 750 MG tablet Commonly known as:  LEVAQUIN Take 1 tablet (750 mg total) by mouth daily.   lovastatin 20 MG tablet Commonly known as:  MEVACOR TAKE 2 TABLETS BY MOUTH DAILY AT 6PM   metFORMIN 1000 MG tablet Commonly known as:  GLUCOPHAGE TAKE ONE TABLET BY MOUTH TWICE DAILY WITH  MEALS.   omeprazole 20 MG capsule Commonly known as:  PRILOSEC Take 1 capsule (20 mg total) by mouth daily.   potassium chloride SA 20 MEQ tablet Commonly known as:  K-DUR,KLOR-CON Take 1 tablet (20 mEq total) by mouth daily. With the lasix   ranitidine 150 MG tablet Commonly known as:  ZANTAC TAKE ONE TABLET BY MOUTH TWICE DAILY.   Rivaroxaban 15 MG Tabs tablet Commonly known as:  XARELTO Take 1 tablet (15 mg total) by mouth 2 (two) times daily with a meal.   rivaroxaban 20 MG Tabs tablet Commonly known as:  XARELTO Take 1 tablet (20 mg total) by mouth daily. Start taking on:  01/21/2016   sacubitril-valsartan 24-26 MG Commonly known as:  ENTRESTO Take 1 tablet by mouth 2 (two) times daily.   spironolactone 25 MG tablet Commonly known as:  ALDACTONE Take 0.5 tablets (12.5 mg total) by mouth daily.   terbinafine 1 % cream Commonly known as:  LAMISIL Apply topically 2 (two) times daily.   traMADol 50 MG tablet Commonly known as:  ULTRAM Take 50 mg by mouth every 6 (six) hours as needed for pain.       Discharge Instructions: Please refer to Patient Instructions section of EMR for full details.  Patient was counseled important signs and symptoms that should prompt return to medical care, changes in medications, dietary instructions, activity restrictions, and follow up appointments.   Follow-Up Appointments: Follow-up Information    Marca Anconaalton McLean, MD Follow up on 01/15/2016.   Specialty:  Cardiology Why:  at 0920 for post hospital follow up. Please bring all of your medications to your visit.  The code for parking is 0030. Contact information: [address] [place] [address] [phone]        Norberto SorensonSHAW,EVA, MD Follow up on 01/14/2016.   Specialty:  Family Medicine Why:  At 11:45am .. for hospital follow up appt.  Contact information: [address] [place] [address] [phone]          Tillman Sersngela C Kahlie Deutscher, DO 01/07/2016, 9:10 AM PGY-1, [facility]

## 2015-12-30 NOTE — Discharge Instructions (Addendum)
Follow up with your PCP after discharge Follow up with Dr. Shirlee Latch as scheduled   Information on my medicine - XARELTO (rivaroxaban)  This medication education was reviewed with me or my healthcare representative as part of my discharge preparation.  The pharmacist that spoke with me during my hospital stay was:  Sherrlyn Hock, Student-PharmD Candidate   WHY WAS Kaitlin Cantrell PRESCRIBED FOR YOU? Xarelto was prescribed to treat blood clots that may have been found in the veins of your legs (deep vein thrombosis) or in your lungs (pulmonary embolism) and to reduce the risk of them occurring again.  What do you need to know about Xarelto? The starting dose is one 15 mg tablet taken TWICE daily with food for the FIRST 21 DAYS then on (enter date)  01/21/2016  the dose is changed to one 20 mg tablet taken ONCE A DAY with your evening meal.  DO NOT stop taking Xarelto without talking to the health care provider who prescribed the medication.  Refill your prescription for 20 mg tablets before you run out.  After discharge, you should have regular check-up appointments with your healthcare provider that is prescribing your Xarelto.  In the future your dose may need to be changed if your kidney function changes by a significant amount.  What do you do if you miss a dose? If you are taking Xarelto TWICE DAILY and you miss a dose, take it as soon as you remember. You may take two 15 mg tablets (total 30 mg) at the same time then resume your regularly scheduled 15 mg twice daily the next day.  If you are taking Xarelto ONCE DAILY and you miss a dose, take it as soon as you remember on the same day then continue your regularly scheduled once daily regimen the next day. Do not take two doses of Xarelto at the same time.   Important Safety Information Xarelto is a blood thinner medicine that can cause bleeding. You should call your healthcare provider right away if you experience any of the  following: ? Bleeding from an injury or your nose that does not stop. ? Unusual colored urine (red or dark brown) or unusual colored stools (red or black). ? Unusual bruising for unknown reasons. ? A serious fall or if you hit your head (even if there is no bleeding).  Some medicines may interact with Xarelto and might increase your risk of bleeding while on Xarelto. To help avoid this, consult your healthcare provider or pharmacist prior to using any new prescription or non-prescription medications, including herbals, vitamins, non-steroidal anti-inflammatory drugs (NSAIDs) and supplements.  This website has more information on Xarelto: VisitDestination.com.br.

## 2015-12-30 NOTE — Progress Notes (Signed)
Inpatient Diabetes Program Recommendations  AACE/ADA: New Consensus Statement on Inpatient Glycemic Control (2015)  Target Ranges:  Prepandial:   less than 140 mg/dL      Peak postprandial:   less than 180 mg/dL (1-2 hours)      Critically ill patients:  140 - 180 mg/dL   Results for Kaitlin Cantrell, Kaitlin Cantrell (MRN [id number]) as of 12/30/2015 10:31  Ref. Range 12/29/2015 16:14 12/29/2015 20:53 12/29/2015 23:33 12/30/2015 06:49  Glucose-Capillary Latest Ref Range: 65 - 99 mg/dL 561 (H) 537 (H) 943 (H) 245 (H)   Review of Glycemic Control  Diabetes history: DM2 Outpatient Diabetes medications: Amaryl 2 mg BID, Metformin 1000 mg BID Current orders for Inpatient glycemic control: Novolog 0-15 units TID with meals, Novolog 0-5 units QHS  Inpatient Diabetes Program Recommendations: Insulin - Basal: Please consider ordering low dose basal insulin. Recommend starting with Levemir 10 units Q24H (based on 101 kg x 0.1 units).   NOTE: Patient received one time dose of Solumedrol 125 mg on 12/29/15 which is contributing to hyperglycemia.   Thanks, Orlando Penner, RN, MSN, CDE Diabetes Coordinator Inpatient Diabetes Program [phone] (Team Pager from 8am to 5pm) [phone] (AP office) [phone] [facility] office) [phone] [facility] office)

## 2015-12-30 NOTE — Progress Notes (Signed)
  Echocardiogram 2D Echocardiogram with Definity has been performed.  Cathie Beams 12/30/2015, 3:18 PM

## 2015-12-30 NOTE — Progress Notes (Signed)
Family Medicine Teaching Service Daily Progress Note Intern Pager: [phone]  Patient name: Kaitlin Cantrell Medical record number: [id number] Date of birth: 09/26/1955 Age: 60 y.o. Gender: female  Primary Care Provider: Norberto SorensonSHAW,EVA, MD Consultants: None Code Status: FULL  Pt Overview and Major Events to Date:  Admit 8/29  Assessment and Plan: Kaitlin BlalockDonna R Marney is a 60 y.o. female presenting with shortness of breath found to have a pulmonary embolism. PMH is significant for COPD, tobacco use, DM, HTN, Arthritis, HLD, chronic pain syndrome.  Shortness of breath:  Newly found submassive PE on CT angio which likely explains the majority of her symptoms. Hemodynamically stable with mild respiratory symptoms of SOB, and given this she is not a candidate for emergent thrombectomy. CT showed concerns for right heart strain, initial troponin 0.05 likely due to strain. Must also consider pneumonia vs COPD exacerbation. WBC 9.5, afebrile making infectious process less likley. CXR on 7/17 to work up cough and SOB showed questionable left lower lobe infiltrate which increased on followup CXR on 8/28 to a small to moderate sized left pleural effusion.  She denies fever, worsening cough or sputum production making active COPD especially in the setting of recent treatent in 10/2015 for COPD exacerbation. Risk factors for thrombo-embolus include prolonged history of smoking with no recent screening cancer imaging, but no recent travel, or exogenous hormones.  Colonoscopy 2013 normal, last pap 2012 negative for intraepithelial neoplasia no HPV component. PE likely contributed to by tobacco abuse, however as she is due for several cancer screening tests, cannot exclude malignancy especially as tobacco in and of itself predisposes to cancer -repeat EKG this am stable, showing no change from prior -Echo pending read due to heart strain  -trend trops 0.04, 0.05, 0.06 >> cardiology consulted -new unprovoked PE, was  started on therapeutic lovenox per pharm. Plan to transition to oral anticoagulation (Xarelto x 3 months so 15 bid x21 days followed by 20 once daily to complete 3 months course) -Xarelto teaching -CBC, BMP in am showing Na 134 low, Chloride 96 low - for her pleural effusion, given lasix 40 mg IV x1, assess I/O. Can re-order CXR and give Lasix if not improved.  COPD: Stable, no evidence of COPD exacerbation. Pt uses symbicort, atrovent, dulera at home.  -O2 as needed, not on home O2 -xopenex , atrovent, dulera  HTN: Mildly hypertensive, 156/96 -losartan 100mg  and HCTZ 25mg , rather than home losartan-HCTZ  HLD- Last LDL 87 on 10/01/2015 -continue home lovastatin  DM: On glimepiride, metformin at home. HgA1C 7.1 05/2015. Am glucose 248. -SSI ACHS  GERD: stable  -pepcid  Arthritis/Chronic pain syndrome: History of back surgery. Tramadol, Norco at home. On review of [facility] [facility] data base last received Norco 7.5-325 # 90 on 12/04/2015 and tramadol 50 mg # 240 on 8/4//2017 -Continued home norco, gabapentin, flexeril, tramadol  Tobacco abuse: Pt smokes 0.5-1.0 packs per day.  -nicotine patch -smoking cessation counseling  FEN/GI: heart healthy diet, home pepcid, SLIV Prophylaxis: lovenox treatment  Disposition: Admitted to tele, dispo pending clinical improvement  Subjective:  Breathing much better this am. States she feels better and has no chest pain. Was having some pleuritic pain yesterday but gone today.   Objective: Temp:  [97.7 F (36.5 C)-98.9 F (37.2 C)] 97.7 F (36.5 C) (08/30 0458) Pulse Rate:  [106-122] 106 (08/30 0458) Resp:  [13-42] 16 (08/30 0458) BP: (121-156)/(74-107) 124/76 (08/30 0458) SpO2:  [94 %-100 %] 98 % (08/30 0740) Weight:  [223 lb 6.4 oz (101.3 kg)-226  lb 8 oz (102.7 kg)] 223 lb 6.4 oz (101.3 kg) (08/30 0458) Physical Exam: General: NAD, mildly diaphoretic Eyes: PEERLA, EOMI ENTM: moist mucous membranes Cardiovascular: rrr, no  m/r/g Respiratory: normal WOB, good air movement throughout, no wheezing noted Abdomen: obese, soft, non tender, non distended, +BS Skin: no rashes or wounds Extremities: slight LE edema, no tenderness. Varicose veins and skin peeling skin over feet Neuro: CN II-XII grossly intact, strength 5/5 all extremities Psych: mood and affect appropriate  Laboratory:  Recent Labs Lab 12/28/15 1516 12/29/15 0950 12/30/15 0559  WBC 10.9* 9.5 10.2  HGB 10.8* 10.2* 10.7*  HCT 33.3* 36.2 37.1  PLT  --  315 358    Recent Labs Lab 12/28/15 1509 12/29/15 0950 12/30/15 0559  NA 137 136 134*  K 4.7 4.0 4.0  CL 99 100* 96*  CO2 28 25 27   BUN 16 15 19   CREATININE 1.05 1.03* 1.11*  CALCIUM 9.0 9.0 9.4  PROT 6.6 6.6  --   BILITOT 0.5 0.6  --   ALKPHOS 99 91  --   ALT 6 8*  --   AST 10 16  --   GLUCOSE 168* 207* 248*   Imaging/Diagnostic Tests:  Ct Angio Chest Pe W And/or Wo Contrast  Result Date: 12/29/2015 CLINICAL DATA:  Increasing shortness of Breath. Question pulmonary embolus. EXAM: CT ANGIOGRAPHY CHEST WITH CONTRAST TECHNIQUE: Multidetector CT imaging of the chest was performed using the standard protocol during bolus administration of intravenous contrast. Multiplanar CT image reconstructions and MIPs were obtained to evaluate the vascular anatomy. CONTRAST:  Contrast 100 cc Isovue 370 COMPARISON:  None. FINDINGS: Cardiovascular: Heart is enlarged. No pericardial effusion. No thoracic aortic aneurysm. Acute pulmonary embolus is identified in segmental and subsegmental pulmonary arteries to the right upper lobe, right middle lobe, and both lower lobes. RV/LV ratios difficult to assess given the lack of contrast on left heart, but the ratio is probably approaching 1.0. Mediastinum/Nodes: 10 mm short axis prevascular lymph node is upper normal to borderline increased. No other mediastinal lymphadenopathy is evident. There is no hilar lymphadenopathy. There is no axillary lymphadenopathy.  Lungs/Pleura: Assessment of lung parenchyma degraded by patient breathing motion during image acquisition. Small wedge-shaped peripheral defect posterior right lower lobe may be related atelectasis or infarct. There is left base collapse/ consolidation with small to moderate left pleural effusion. Upper Abdomen: Unremarkable. Musculoskeletal: Convex rightward lumbar scoliosis noted. Bone windows reveal no worrisome lytic or sclerotic osseous lesions. Review of the MIP images confirms the above findings. IMPRESSION: 1. Positive for acute PE with CT evidence of right heart strain (RV/LV Ratio = 1.0) consistent with at least submassive (intermediate risk) PE. The presence of right heart strain has been associated with an increased risk of morbidity and mortality. Please activate Code PE by paging [phone]. Critical Value/emergent results were called by telephone at the time of interpretation on 12/29/2015 at 12:06 pm to Dr. Jacalyn Lefevre , who verbally acknowledged these results. Electronically Signed   By: Kennith Center M.D.   On: 12/29/2015 12:06   Freddrick March, MD 12/30/2015, 8:19 AM PGY-1, [facility] FPTS Intern pager: [phone], text pages welcome

## 2015-12-30 NOTE — Progress Notes (Signed)
Patient have run of v.tach this am and 8 beats of v.tach, pt. Asymptomatic. V/s stable. MD notified, no new order given. Will continue to monitor.

## 2015-12-31 DIAGNOSIS — I10 Essential (primary) hypertension: Secondary | ICD-10-CM

## 2015-12-31 DIAGNOSIS — R600 Localized edema: Secondary | ICD-10-CM

## 2015-12-31 DIAGNOSIS — I2699 Other pulmonary embolism without acute cor pulmonale: Secondary | ICD-10-CM

## 2015-12-31 DIAGNOSIS — I2602 Saddle embolus of pulmonary artery with acute cor pulmonale: Secondary | ICD-10-CM

## 2015-12-31 DIAGNOSIS — J90 Pleural effusion, not elsewhere classified: Secondary | ICD-10-CM

## 2015-12-31 DIAGNOSIS — Z720 Tobacco use: Secondary | ICD-10-CM

## 2015-12-31 LAB — BASIC METABOLIC PANEL
ANION GAP: 11 (ref 5–15)
BUN: 26 mg/dL — ABNORMAL HIGH (ref 6–20)
CALCIUM: 9.3 mg/dL (ref 8.9–10.3)
CHLORIDE: 93 mmol/L — AB (ref 101–111)
CO2: 29 mmol/L (ref 22–32)
Creatinine, Ser: 1.3 mg/dL — ABNORMAL HIGH (ref 0.44–1.00)
GFR calc non Af Amer: 44 mL/min — ABNORMAL LOW (ref >60)
GFR, EST AFRICAN AMERICAN: 51 mL/min — AB (ref >60)
Glucose, Bld: 237 mg/dL — ABNORMAL HIGH (ref 65–99)
Potassium: 4.6 mmol/L (ref 3.5–5.1)
Sodium: 133 mmol/L — ABNORMAL LOW (ref 135–145)

## 2015-12-31 LAB — GLUCOSE, CAPILLARY
GLUCOSE-CAPILLARY: 210 mg/dL — AB (ref 65–99)
GLUCOSE-CAPILLARY: 143 mg/dL — AB (ref 65–99)
Glucose-Capillary: 205 mg/dL — ABNORMAL HIGH (ref 65–99)
Glucose-Capillary: 211 mg/dL — ABNORMAL HIGH (ref 65–99)

## 2015-12-31 LAB — CBC
HEMATOCRIT: 34.6 % — AB (ref 36.0–46.0)
HEMOGLOBIN: 9.9 g/dL — AB (ref 12.0–15.0)
MCH: 21.7 pg — ABNORMAL LOW (ref 26.0–34.0)
MCHC: 28.6 g/dL — ABNORMAL LOW (ref 30.0–36.0)
MCV: 75.7 fL — ABNORMAL LOW (ref 78.0–100.0)
Platelets: 354 10*3/uL (ref 150–400)
RBC: 4.57 MIL/uL (ref 3.87–5.11)
RDW: 16.9 % — AB (ref 11.5–15.5)
WBC: 11.9 10*3/uL — AB (ref 4.0–10.5)

## 2015-12-31 LAB — TROPONIN I
Troponin I: 0.06 ng/mL (ref <0.03)
Troponin I: 0.06 ng/mL (ref <0.03)
Troponin I: 0.06 ng/mL (ref <0.03)

## 2015-12-31 MED ORDER — RIVAROXABAN 15 MG PO TABS
15.0000 mg | ORAL_TABLET | Freq: Two times a day (BID) | ORAL | Status: DC
  Administered 2015-12-31 – 2016-01-05 (×11): 15 mg via ORAL
  Filled 2015-12-31 (×11): qty 1

## 2015-12-31 MED ORDER — IPRATROPIUM BROMIDE 0.02 % IN SOLN
2.5000 mL | Freq: Two times a day (BID) | RESPIRATORY_TRACT | Status: DC
  Administered 2015-12-31 – 2016-01-05 (×9): 0.5 mg via RESPIRATORY_TRACT
  Filled 2015-12-31 (×10): qty 2.5

## 2015-12-31 MED ORDER — RIVAROXABAN 20 MG PO TABS: 20.0000 mg | ORAL_TABLET | Freq: Every day | ORAL | Status: DC

## 2015-12-31 NOTE — Progress Notes (Signed)
Patient alarmed 8 beats vtach and 10 beats SVT.  Patient asymptomatic.  MD paged.  Will continue to monitor.

## 2015-12-31 NOTE — Progress Notes (Signed)
Family Medicine Teaching Service Daily Progress Note Intern Pager: [phone]  Patient name: Kaitlin Cantrell Medical record number: [id number] Date of birth: January 04, 1956 Age: 60 y.o. Gender: female  Primary Care Provider: Norberto Sorenson, MD Consultants: None Code Status: FULL  Pt Overview and Major Events to Date:  Admit 8/29  Assessment and Plan: Kaitlin Cantrell is a 60 y.o. female presenting with shortness of breath found to have a pulmonary embolism. PMH is significant for COPD, tobacco use, DM, HTN, Arthritis, HLD, chronic pain syndrome.  Shortness of breath: Newly unprovoked submassive PE on CT angio. Was started on therapeutic Lovenox. Stable on RA. This am no pleuritic chest pain, no sob, lungs clear and pt states she is able to ambulate the halls with no shortness of breath on exertion. -repeat EKG stable, showing no change from prior -Echo done due to heart strain. Showed moderate LVH. EF 20-25% with diffuse hypokinesis, G3DD.  -trend trops 0.04, 0.05, 0.06, 0.06 likely due to heart strain. Stable. - cardiology consulted, appreciate recs -Transitioned to oral anticoagulation 8/31. Xarelto x 3 months: 15 bid x21 days followed by 20 once daily to complete 3 months course -Xarelto teaching performed -CBC, BMP in am showing Na 133 low, Chloride 93 low. Likely due to fluid status. -pleural effusion, given lasix 40 mg IV x2, assess I/O. Monitor.  COPD: Stable, no evidence of COPD exacerbation. Pt uses symbicort, atrovent, dulera at home.  -O2 as needed, not on home O2 -xopenex , atrovent, dulera  HTN: Normotensive at 120s/80s -losartan 100mg  and HCTZ 25mg , rather than home losartan-HCTZ  HLD- Last LDL 87 on 10/01/2015 -continue home lovastatin  DM: On glimepiride, metformin at home. HgA1C 7.1 05/2015. Am glucose 248. -SSI ACHS  GERD: stable  -pepcid  Arthritis/Chronic pain syndrome: History of back surgery. Tramadol, Norco at home. On review of [facility] data base last  received Norco 7.5-325 # 90 on 12/04/2015 and tramadol 50 mg # 240 on 8/4//2017 -Continued home norco, gabapentin, flexeril, tramadol  Tobacco abuse: Pt smokes 0.5-1.0 packs per day. Has tried quitting in the past one time 20 years ago. Is interested in quitting if she can. -nicotine patch -smoking cessation counseling performed  FEN/GI: heart healthy diet, home pepcid, SLIV Prophylaxis: lovenox treatment  Disposition: Admitted to tele, dispo pending clinical improvement  Subjective:  Breathing much better this am. States she feels better and has no chest pain. Is able to walk the halls with no dyspnea on exertion.  Objective: Temp:  [97.6 F (36.4 C)-98.7 F (37.1 C)] 97.8 F (36.6 C) (08/31 0548) Pulse Rate:  [104-113] 109 (08/31 0548) Resp:  [18] 18 (08/31 0548) BP: (121-129)/(79-88) 121/88 (08/31 0548) SpO2:  [95 %-100 %] 100 % (08/31 0548) Weight:  [222 lb 14.4 oz (101.1 kg)] 222 lb 14.4 oz (101.1 kg) (08/31 0548) Physical Exam: General: pleasant, in NAD, obese female sitting up in hospital bed Eyes: PERRL, EOMI ENTM: moist mucous membranes, oropharynx clear Cardiovascular: rrr, no m/r/g Respiratory: normal WOB, good air movement throughout, no wheezing noted Abdomen: soft, non tender, non distended, +BS Skin: no rashes or wounds Extremities: slight LE edema, no tenderness. Varicose veins and skin peeling skin over feet Neuro: CN II-XII grossly intact, strength 5/5 all extremities Psych: mood and affect appropriate   Laboratory:  Recent Labs Lab 12/29/15 0950 12/30/15 0559 12/31/15 0144  WBC 9.5 10.2 11.9*  HGB 10.2* 10.7* 9.9*  HCT 36.2 37.1 34.6*  PLT 315 358 354    Recent Labs Lab 12/28/15 1509 12/29/15  [id number] 12/30/15 0559 12/31/15 0144  NA 137 136 134* 133*  K 4.7 4.0 4.0 4.6  CL 99 100* 96* 93*  CO2 28 25 27 29   BUN 16 15 19  26*  CREATININE 1.05 1.03* 1.11* 1.30*  CALCIUM 9.0 9.0 9.4 9.3  PROT 6.6 6.6  --   --   BILITOT 0.5 0.6  --   --    ALKPHOS 99 91  --   --   ALT 6 8*  --   --   AST 10 16  --   --   GLUCOSE 168* 207* 248* 237*   Imaging/Diagnostic Tests:  Dg Chest 2 View  Result Date: 12/30/2015 CLINICAL DATA:  Shortness of breath. History of pneumonia and recent pulmonary embolism. EXAM: CHEST  2 VIEW COMPARISON:  Radiographs 12/28/2015.  CT 12/29/2015. FINDINGS: The heart size and mediastinal contours are stable. There is mild cardiomegaly. A subpulmonic left pleural effusion and adjacent left basilar atelectasis have not significantly changed. The right lung is clear. There is no pneumothorax. The bones appear stable. IMPRESSION: Stable left pleural effusion and left basilar atelectasis. No new findings. Electronically Signed   By: Carey BullocksWilliam  Veazey M.D.   On: 12/30/2015 11:13   Freddrick MarchYashika Athalene Kolle, MD 12/31/2015, 8:12 AM PGY-1, [facility] FPTS Intern pager: [phone], text pages welcome

## 2016-01-01 ENCOUNTER — Inpatient Hospital Stay (HOSPITAL_COMMUNITY): Payer: PPO

## 2016-01-01 DIAGNOSIS — I2609 Other pulmonary embolism with acute cor pulmonale: Secondary | ICD-10-CM | POA: Diagnosis not present

## 2016-01-01 DIAGNOSIS — I5041 Acute combined systolic (congestive) and diastolic (congestive) heart failure: Secondary | ICD-10-CM | POA: Diagnosis not present

## 2016-01-01 DIAGNOSIS — I429 Cardiomyopathy, unspecified: Secondary | ICD-10-CM

## 2016-01-01 LAB — BASIC METABOLIC PANEL
Anion gap: 11 (ref 5–15)
BUN: 25 mg/dL — AB (ref 6–20)
CALCIUM: 9.7 mg/dL (ref 8.9–10.3)
CHLORIDE: 91 mmol/L — AB (ref 101–111)
CO2: 29 mmol/L (ref 22–32)
CREATININE: 1.3 mg/dL — AB (ref 0.44–1.00)
GFR calc non Af Amer: 44 mL/min — ABNORMAL LOW (ref >60)
GFR, EST AFRICAN AMERICAN: 51 mL/min — AB (ref >60)
Glucose, Bld: 252 mg/dL — ABNORMAL HIGH (ref 65–99)
POTASSIUM: 4.6 mmol/L (ref 3.5–5.1)
SODIUM: 131 mmol/L — AB (ref 135–145)

## 2016-01-01 LAB — CBC
HCT: 36.2 % (ref 36.0–46.0)
HEMOGLOBIN: 10.4 g/dL — AB (ref 12.0–15.0)
MCH: 21.8 pg — ABNORMAL LOW (ref 26.0–34.0)
MCHC: 28.7 g/dL — ABNORMAL LOW (ref 30.0–36.0)
MCV: 75.9 fL — ABNORMAL LOW (ref 78.0–100.0)
PLATELETS: 374 10*3/uL (ref 150–400)
RBC: 4.77 MIL/uL (ref 3.87–5.11)
RDW: 16.8 % — ABNORMAL HIGH (ref 11.5–15.5)
WBC: 10.5 10*3/uL (ref 4.0–10.5)

## 2016-01-01 LAB — GLUCOSE, CAPILLARY
GLUCOSE-CAPILLARY: 218 mg/dL — AB (ref 65–99)
GLUCOSE-CAPILLARY: 166 mg/dL — AB (ref 65–99)
Glucose-Capillary: 194 mg/dL — ABNORMAL HIGH (ref 65–99)
Glucose-Capillary: 213 mg/dL — ABNORMAL HIGH (ref 65–99)

## 2016-01-01 MED ORDER — PRAVASTATIN SODIUM 20 MG PO TABS: 20.0000 mg | ORAL_TABLET | Freq: Every day | ORAL | Status: DC

## 2016-01-01 MED ORDER — FUROSEMIDE 10 MG/ML IJ SOLN
60.0000 mg | Freq: Two times a day (BID) | INTRAMUSCULAR | Status: DC
  Administered 2016-01-01 – 2016-01-02 (×3): 60 mg via INTRAVENOUS
  Filled 2016-01-01 (×3): qty 6

## 2016-01-01 MED ORDER — GADOBENATE DIMEGLUMINE 529 MG/ML IV SOLN
30.0000 mL | Freq: Once | INTRAVENOUS | Status: AC
  Administered 2016-01-01: 28 mL via INTRAVENOUS

## 2016-01-01 MED ORDER — CARVEDILOL 3.125 MG PO TABS
3.1250 mg | ORAL_TABLET | Freq: Two times a day (BID) | ORAL | Status: DC
  Administered 2016-01-01 – 2016-01-05 (×8): 3.125 mg via ORAL
  Filled 2016-01-01 (×8): qty 1

## 2016-01-01 MED ORDER — PRAVASTATIN SODIUM 40 MG PO TABS
40.0000 mg | ORAL_TABLET | Freq: Every day | ORAL | Status: DC
  Administered 2016-01-01 – 2016-01-04 (×4): 40 mg via ORAL
  Filled 2016-01-01 (×4): qty 1

## 2016-01-01 MED ORDER — SACUBITRIL-VALSARTAN 24-26 MG PO TABS
1.0000 | ORAL_TABLET | Freq: Two times a day (BID) | ORAL | Status: DC
  Administered 2016-01-02 – 2016-01-05 (×7): 1 via ORAL
  Filled 2016-01-01 (×7): qty 1

## 2016-01-01 NOTE — Progress Notes (Signed)
Patient refused bed alarm. Will continue to monitor patient. 

## 2016-01-01 NOTE — Care Management Important Message (Signed)
Important Message  Patient Details  Name: Kaitlin Cantrell MRN: [id number] Date of Birth: 10/09/1955   Medicare Important Message Given:  Yes    Dorena Bodo 01/01/2016, 10:21 AM

## 2016-01-01 NOTE — Consult Note (Signed)
Advanced Heart Failure Team Consult Note  Referring Physician: Dr. Randolm Idol Primary Physician: Dr. Clelia Croft Primary Cardiologist:  None  Reason for Consultation: New onset CHF  HPI:    Kaitlin Cantrell is a 60 y.o. female with history of COPD, tobacco abuse, DM, HTN, Arthritis, HDL and chronic pain syndrome who presented to [facility] with SOB. Pertinent labs on admission include K 4.7, Creatinine 1.05, BUN, 930.2, Troponin up to 0.06.  WBC 10.8 recently treated as outpatient for CAP.  CT angio 12/29/15 submassive PE with concerns for right heart strain. Now on Xarelto.   CXR 830/17 with stable left pleural effusion and left basilar atelectasis.   Echo 12/30/15 shows LVEF 20-25%, Grade 3 DD, Mod MR, mod LAE, Mild RV dilation with mildly reduced systolic function, Moderate RAE, Moderate TR. Peak PA pressure 54 mm Hg.   States she has felt increasingly fatigued with more DOE over the past several months ( since the end of June). Was at her USOH prior to this.  Has been a very stressful year with death of Mother, Pet of 16 years, and husband receiving diagnosis and treatment for Colon Cancer.    She denies overt chest pain or history of any. No personal history of known heart disease.  Father and brother both have had HF and CKD. No MIs. DOE walking from bed to doorway. + Orthopnea, unsure about bendopnea.  Occasional lightheadedness with rapid standing but not marked or limiting. No near syncope.    She does not weight herself at home but suspects she may be up 10 lbs (weights relatively stable in 220s long term by chart).   Smokes 1 ppd (x 42 years). No ETOH or drug use.  Lives in [place] with husband. She does not weight daily. Eats ice throughout the day.  Eats a lot of fast food. Previously was a Office manager. She denies any recent surgeries, long travel, or known CA diagnosis.  Review of Systems: [y] = yes, [ ]  = no   General: Weight gain [y]; Weight loss [ ] ; Anorexia [ ] ; Fatigue [ ] ;  Fever [ ] ; Chills [ ] ; Weakness [ ]   Cardiac: Chest pain/pressure [ ] ; Resting SOB [ ] ; Exertional SOB [y]; Orthopnea [y]; Pedal Edema [y]; Palpitations [ ] ; Syncope [ ] ; Presyncope [ ] ; Paroxysmal nocturnal dyspnea[ ]   Pulmonary: Cough [y]; Wheezing[ ] ; Hemoptysis[ ] ; Sputum [ ] ; Snoring [ ]   GI: Vomiting[ ] ; Dysphagia[ ] ; Melena[ ] ; Hematochezia [ ] ; Heartburn[ ] ; Abdominal pain [ ] ; Constipation [ ] ; Diarrhea [ ] ; BRBPR [ ]   GU: Hematuria[ ] ; Dysuria [ ] ; Nocturia[ ]   Vascular: Pain in legs with walking [ ] ; Pain in feet with lying flat [ ] ; Non-healing sores [ ] ; Stroke [ ] ; TIA [ ] ; Slurred speech [ ] ;  Neuro: Headaches[ ] ; Vertigo[ ] ; Seizures[ ] ; Paresthesias[ ] ;Blurred vision [ ] ; Diplopia [ ] ; Vision changes [ ]   Ortho/Skin: Arthritis [y]; Joint pain [y]; Muscle pain [ ] ; Joint swelling [ ] ; Back Pain [ ] ; Rash [ ]   Psych: Depression[ ] ; Anxiety[ ]   Heme: Bleeding problems [ ] ; Clotting disorders [ ] ; Anemia [ ]   Endocrine: Diabetes [y]; Thyroid dysfunction[ ]   Home Medications Prior to Admission medications   Medication Sig Start Date End Date Taking? Authorizing Provider  budesonide-formoterol (SYMBICORT) 160-4.5 MCG/ACT inhaler Inhale 1 puff into the lungs 2 (two) times daily. 10/01/15  Yes Sherren Mocha, MD  cyclobenzaprine (FLEXERIL) 10 MG tablet TAKE ONE TABLET  BY MOUTH THREE TIMES DAILY AS NEEDED FOR MUSCLE SPASMS 10/01/15  Yes Sherren MochaEva N Shaw, MD  diazepam (VALIUM) 5 MG tablet TAKE ONE TABLET BY MOUTH AT BEDTIME AND  MAY  REPEAT  DOSE  ONE  TIME  IF  NEEDED 10/01/15  Yes Sherren MochaEva N Shaw, MD  gabapentin (NEURONTIN) 300 MG capsule Take 1 capsule (300 mg total) by mouth 3 (three) times daily. Titrate up slowely 10/01/15  Yes Sherren MochaEva N Shaw, MD  glimepiride (AMARYL) 2 MG tablet TAKE ONE TABLET BY MOUTH TWICE DAILY 12/22/15  Yes Sherren MochaEva N Shaw, MD  HYDROcodone-acetaminophen (NORCO) 7.5-325 MG tablet Take 1 tablet by mouth every 6 (six) hours as needed for pain. 12/04/15  Yes Historical Provider, MD  ipratropium  (ATROVENT HFA) 17 MCG/ACT inhaler Inhale 2 puffs into the lungs 4 (four) times daily. 11/16/15  Yes Sherren MochaEva N Shaw, MD  [employer] LaniusKrill Oil CAPS Take 1 capsule by mouth daily.    Yes Historical Provider, MD  losartan-hydrochlorothiazide (HYZAAR) 100-25 MG tablet Take 1 tablet by mouth daily. 10/01/15  Yes Sherren MochaEva N Shaw, MD  lovastatin (MEVACOR) 20 MG tablet TAKE 2 TABLETS BY MOUTH DAILY AT 6PM 12/22/15  Yes Sherren MochaEva N Shaw, MD  metFORMIN (GLUCOPHAGE) 1000 MG tablet TAKE ONE TABLET BY MOUTH TWICE DAILY WITH  MEALS. 12/22/15  Yes Sherren MochaEva N Shaw, MD  omeprazole (PRILOSEC) 20 MG capsule Take 1 capsule (20 mg total) by mouth daily. 09/07/12  Yes Sherren MochaEva N Shaw, MD  potassium chloride SA (K-DUR,KLOR-CON) 20 MEQ tablet Take 1 tablet (20 mEq total) by mouth daily. With the lasix 12/28/15  Yes Sherren MochaEva N Shaw, MD  ranitidine (ZANTAC) 150 MG tablet TAKE ONE TABLET BY MOUTH TWICE DAILY. 09/11/15  Yes Sherren MochaEva N Shaw, MD  traMADol (ULTRAM) 50 MG tablet Take 50 mg by mouth every 6 (six) hours as needed for pain. 12/04/15  Yes Historical Provider, MD  azelastine (OPTIVAR) 0.05 % ophthalmic solution Place 1 drop into both eyes 2 (two) times daily. Patient not taking: Reported on 12/29/2015 09/05/14   Sherren MochaEva N Shaw, MD  clotrimazole-betamethasone (LOTRISONE) cream Apply 1 application topically 2 (two) times daily. Patient not taking: Reported on 12/29/2015 10/01/15   Sherren MochaEva N Shaw, MD  furosemide (LASIX) 40 MG tablet Take 1 tablet (40 mg total) by mouth daily. Patient not taking: Reported on 12/29/2015 12/28/15   Sherren MochaEva N Shaw, MD  levofloxacin (LEVAQUIN) 750 MG tablet Take 1 tablet (750 mg total) by mouth daily. 12/28/15   Sherren MochaEva N Shaw, MD  metoCLOPramide (REGLAN) 10 MG tablet Take 1 tablet (10 mg total) by mouth 2 (two) times daily. Patient not taking: Reported on 12/29/2015 10/23/15   Domenick GongAshley Mortenson, MD  polyethylene glycol-electrolytes (NULYTELY/GOLYTELY) 420 g solution Take 4,000 mLs by mouth once. Patient not taking: Reported on 12/29/2015 10/23/15   Domenick GongAshley Mortenson, MD     Past Medical History: Past Medical History:  Diagnosis Date  . Allergy    seasonal  . Arthritis   . Diabetes mellitus   . GERD (gastroesophageal reflux disease)   . History of pneumonia    15 years ago  . Hyperlipidemia   . Hypertension   . PE (pulmonary embolism) 12/2015    Past Surgical History: Past Surgical History:  Procedure Laterality Date  . ANKLE FRACTURE SURGERY  1974   left and pinned then pins removed  . ANKLE GANGLION CYST EXCISION  1994   right  . BACK SURGERY  1998   discectomy and then fusion  . KNEE ARTHROPLASTY  02/14/2012   Procedure: COMPUTER ASSISTED TOTAL KNEE ARTHROPLASTY;  Surgeon: Cammy Copa, MD;  Location: [facility];  Service: Orthopedics;  Laterality: Left;  Left total knee arthroplasty  . TONSILLECTOMY  1972  . TUBAL LIGATION  1996  . UMBILICAL HERNIA REPAIR  1996    Family History: Family History  Problem Relation Age of Onset  . Colon polyps Father   . Diabetes Father     mother  . Heart disease Father   . Heart murmur Mother   . Diabetes Mother   . Hypertension Mother   . Diabetes Brother   . Cancer Maternal Grandmother   . Diabetes Paternal Grandmother   . Heart disease Paternal Grandmother   . COPD Paternal Grandfather   . Heart disease Paternal Grandfather   . Hypertension Brother   . Hypertension Brother     Social History: Social History   Social History  . Marital status: Married    Spouse name: N/A  . Number of children: 0  . Years of education: N/A   Occupational History  . retired    Social History Main Topics  . Smoking status: Current Every Day Smoker    Packs/day: 1.00    Years: 40.00    Types: Cigarettes  . Smokeless tobacco: Never Used  . Alcohol use No  . Drug use: No  . Sexual activity: Not Asked   Other Topics Concern  . None   Social History Narrative   Exercise walking daily (varies)    Allergies:  Allergies  Allergen Reactions  . Albuterol     Throat/lips swelling  .  Morphine And Related Other (See Comments)    Very aggressive and doesn't help pain    Objective:    Vital Signs:   Temp:  [97.5 F (36.4 C)-98.9 F (37.2 C)] 98.5 F (36.9 C) (09/01 0556) Pulse Rate:  [103-105] 105 (09/01 0556) Resp:  [18] 18 (09/01 0556) BP: (118-133)/(83-95) 133/95 (09/01 0556) SpO2:  [96 %-100 %] 100 % (09/01 0556) Weight:  [222 lb 1.6 oz (100.7 kg)] 222 lb 1.6 oz (100.7 kg) (09/01 0556) Last BM Date: 12/30/15  Weight change: Filed Weights   12/30/15 0458 12/31/15 0548 01/01/16 0556  Weight: 223 lb 6.4 oz (101.3 kg) 222 lb 14.4 oz (101.1 kg) 222 lb 1.6 oz (100.7 kg)    Intake/Output:   Intake/Output Summary (Last 24 hours) at 01/01/16 1010 Last data filed at 01/01/16 0934  Gross per 24 hour  Intake              720 ml  Output             1950 ml  Net            -1230 ml     Physical Exam: General:  Well appearing. NAD HEENT: normal except for poor dentition.  Neck: supple. JVP 11-12cm. Carotids 2+ bilat; no bruits appreciated. No lymphadenopathy or thyromegaly appreciated. Cor: PMI nondisplaced. Regular, slightly tachy. No rubs, gallops or murmurs appreciated, sounds slightly distant Lungs: Diminished basilar sounds.  Abdomen: soft, Obese, NT, ND. No hepatosplenomegaly. + Hiatal hernia. Good bowel sounds. Extremities: no cyanosis, clubbing, rash. Trace to 1+ edema half way to knees. Neuro: alert & orientedx3, cranial nerves grossly intact. moves all 4 extremities w/o difficulty. Affect pleasant  Telemetry: Reviewed personally, sinus tach in 100s.  Labs: Basic Metabolic Panel:  Recent Labs Lab 12/28/15 1509 12/29/15 0950 12/30/15 0559 12/31/15 0144 01/01/16 0222  NA 137 136 134* 133*  131*  K 4.7 4.0 4.0 4.6 4.6  CL 99 100* 96* 93* 91*  CO2 28 25 27 29 29   GLUCOSE 168* 207* 248* 237* 252*  BUN 16 15 19  26* 25*  CREATININE 1.05 1.03* 1.11* 1.30* 1.30*  CALCIUM 9.0 9.0 9.4 9.3 9.7    Liver Function Tests:  Recent Labs Lab  12/28/15 1509 12/29/15 0950  AST 10 16  ALT 6 8*  ALKPHOS 99 91  BILITOT 0.5 0.6  PROT 6.6 6.6  ALBUMIN 3.6 3.0*   No results for input(s): LIPASE, AMYLASE in the last 168 hours. No results for input(s): AMMONIA in the last 168 hours.  CBC:  Recent Labs Lab 12/28/15 1516 12/29/15 0950 12/30/15 0559 12/31/15 0144 01/01/16 0222  WBC 10.9* 9.5 10.2 11.9* 10.5  NEUTROABS  --  6.2  --   --   --   HGB 10.8* 10.2* 10.7* 9.9* 10.4*  HCT 33.3* 36.2 37.1 34.6* 36.2  MCV 70.5* 77.4* 75.3* 75.7* 75.9*  PLT  --  315 358 354 374    Cardiac Enzymes:  Recent Labs Lab 12/30/15 0559 12/30/15 1241 12/31/15 0144 12/31/15 0748 12/31/15 1249  TROPONINI 0.05* 0.06* 0.06* 0.06* 0.06*    BNP: BNP (last 3 results)  Recent Labs  12/28/15 1509 12/29/15 0950  BNP 861.1* 930.2*    ProBNP (last 3 results) No results for input(s): PROBNP in the last 8760 hours.   CBG:  Recent Labs Lab 12/31/15 0623 12/31/15 1117 12/31/15 1602 12/31/15 2120 01/01/16 0554  GLUCAP 205* 210* 143* 211* 194*    Coagulation Studies: No results for input(s): LABPROT, INR in the last 72 hours.  Other results: EKG: Sinus tach at 108 with septal Q waves and PVC's   Imaging: Dg Chest 2 View  Result Date: 12/30/2015 CLINICAL DATA:  Shortness of breath. History of pneumonia and recent pulmonary embolism. EXAM: CHEST  2 VIEW COMPARISON:  Radiographs 12/28/2015.  CT 12/29/2015. FINDINGS: The heart size and mediastinal contours are stable. There is mild cardiomegaly. A subpulmonic left pleural effusion and adjacent left basilar atelectasis have not significantly changed. The right lung is clear. There is no pneumothorax. The bones appear stable. IMPRESSION: Stable left pleural effusion and left basilar atelectasis. No new findings. Electronically Signed   By: Carey Bullocks M.D.   On: 12/30/2015 11:13      Medications:     Current Medications: . docusate sodium  100 mg Oral Daily  . famotidine   40 mg Oral BID  . gabapentin  300 mg Oral TID  . hydrochlorothiazide  25 mg Oral Daily  . insulin aspart  0-15 Units Subcutaneous TID WC  . insulin aspart  0-5 Units Subcutaneous QHS  . ipratropium  2.5 mL Inhalation BID  . losartan  100 mg Oral Daily  . mometasone-formoterol  2 puff Inhalation BID  . nicotine  21 mg Transdermal Daily  . Rivaroxaban  15 mg Oral BID WC  . [START ON 01/21/2016] rivaroxaban  20 mg Oral Daily  . sodium chloride flush  3 mL Intravenous Q12H     Infusions:      Assessment/Plan   CINA KLUMPP is a 60 y.o. female with history of COPD, tobacco abuse, DM, HTN, Arthritis, HDL and chronic pain syndrome who presented to [facility] with SOB. CT showed submassive PE. Started on Xarelto.   HF team consulted with new CHF - Systolic EF 20-25% with grade 3 DD.  1. Submassive PE by CT - On Xarelto. Per primary -  Unclear etiology. She denies any recent surgeries, long travel, or known CA diagnosis. 2. Acute combined systolic and diastolic CHF - LVEF 20-25%, with grade 3 DD and multi-valvular disease - She is volume overloaded on exam and has never had ischemic work up.  Dr. Shirlee Latch will review Echo.  - Stop HCTZ.  Start lasix 60 mg IV BID. (Lasix naive)  - Stop losartan and will switch to Entresto 24/26 in am.  - Start Coreg 3.125 mg BID.  - Can eventually consider Cleda Daub as well. - Pt will need eventually need R/LHC but with submassive once diuresed.  - Will check Thyroid, SPEP, and HIV for completeness. 3. HTN  - Mildly elevated. Will adjust medications to gear towards HF treatment. 4. HLD -  Continue lovastatin (pravastatin in house) 20 mg daily for now.  -  Last check 10/2015 Total Chol 148, TG 118, HDL 37, LDL 87 5. CKD stage III - Follow closely with diuresis 6. COPD  - Needs to stop smoking 7. DM - Per primary  Length of Stay: 3  Graciella Freer PA-C 01/01/2016, 10:10 AM  Advanced Heart Failure Team Pager [phone] (M-F; 7a - 4p)  Please  contact [facility] for night-coverage after hours (4p -7a ) and weekends on [email or web address]  Patient seen with PA, agree with the above note.   1. PE: Submassive on CT.  Etiology uncertain: no known cancer, long trip, recent surgery.  She is fairly active.  Possibly related to stasis in the setting of cardiomyopathy with severely decreased systolic function.  - Given cardiomyopathy and submassive PE with no certain trigger, think she will need long-term anticoagulation.  She is on Xarelto.  2. Acute on chronic systolic CHF: I reviewed echo, EF appears 15-20% with diffuse hypokinesis.  Uncertain etiology.  No chest pain.  Symptoms began as orthopnea 2 months ago, then developed progressive exertional dyspnea.  No chest pain.  ECG shows anteroseptal MI but no change from 2013 ECG.  She is volume overloaded on exam, good BP.  - Ultimately, needs right and left heart cath for CAD evaluation.  However, given absence of chest pain/ACS and acute admission with submassive PE, would avoid holding anticoagulation for cath for a couple of months.  I will try to get a cardiac MRI this admission to look for evidence of prior MI or infiltrative disease.  - Lasix 60 mg IV bid for now.  - Stop losartan, can start Entresto 24/26 bid tomorrow.  - Coreg 3.125 mg bid.  - Add spironolactone eventually.  - Check TSH, SPEP, HIV.  3. CKD: Stage III.  Follow creatinine closely with diuresis.  4. Active smoker: Counseled to quit.  5. Hyperlipidemia: On lovastatin at home, can use pravastatin here.  Check lipids in am given concern for possible CAD.   Marca Ancona 01/01/2016 11:57 AM

## 2016-01-01 NOTE — Progress Notes (Signed)
Family Medicine Teaching Service Daily Progress Note Intern Pager: [phone]  Patient name: Kaitlin Cantrell Medical record number: [id number] Date of birth: 10/30/1955 Age: 60 y.o. Gender: female  Primary Care Provider: Norberto Sorenson, MD Consultants: None Code Status: FULL  Pt Overview and Major Events to Date:  Admit 8/29  Assessment and Plan: Kaitlin Cantrell is a 60 y.o. female presenting with shortness of breath found to have a pulmonary embolism. PMH is significant for COPD, tobacco use, DM, HTN, Arthritis, HLD, chronic pain syndrome.  #Shortness of breath: Newly unprovoked submassive PE on CT angio. Was started on therapeutic Lovenox. Stable on RA. This am no pleuritic chest pain, no sob, lungs clear and pt states she is able to ambulate the halls with no shortness of breath on exertion. -repeat EKG stable, showing no change from prior -Transitioned to oral anticoagulation 8/31. Xarelto x 3 months: 15 bid x21 days followed by 20 once daily to complete 6 month course. -Xarelto teaching performed -CBC, BMP in am showing Na 131 low, Chloride 91 low. Likely due to fluid status. -pleural effusion, given lasix 40 mg IV x2, following I/O's, IV lasix per cards recs  #Reduced EF -Echo done due to heart strain. Showed moderate LVH. EF 20-25% with diffuse hypokinesis, G3DD.  -trend trops 0.04, 0.05, 0.06, 0.06 likely due to heart strain. Stable. - cardiology consulted, per their recs: -start Entresto 24/26 tomorrow morning -add coreg 3.125 mg BID -add Lasix 60mg  IV BID, follow creatinine w/ diuresis  - possible d/c tomorrow with PO lasix if diuresis is adequate/clinically improved fluid status  #COPD: Stable, no evidence of COPD exacerbation. Pt uses symbicort, atrovent, dulera at home.  -O2 as needed, not on home O2 -xopenex, atrovent, dulera  #HTN: Normotensive at 120s/80s -stop losartan and HCTZ per cards -cards adjusted medications to gear towards CHF treatment  #HLD- Last  LDL 87 on 10/01/2015 -continue home lovastatin  #DM: On glimepiride, metformin at home. HgA1C 7.1 05/2015. Am glucose 248. -SSI ACHS  #GERD: stable  -pepcid  #Arthritis/Chronic pain syndrome: History of back surgery. Tramadol, Norco at home. On review of [facility] data base last received Norco 7.5-325 # 90 on 12/04/2015 and tramadol 50 mg # 240 on 8/4//2017 -Continued home norco, gabapentin, flexeril, tramadol  #Tobacco abuse: Pt smokes 0.5-1.0 packs per day. Has tried quitting in the past one time 20 years ago. Is interested in quitting if she can. -nicotine patch -smoking cessation counseling performed  FEN/GI: heart healthy diet, home pepcid, SLIV Prophylaxis: lovenox treatment  Disposition: Admitted to tele, dispo pending clinical improvement, diuresis  Subjective:  Feeling well this morning, denies chest pain and shortness of breath. No concerns or complaints. Has some persistent lower extremity swelling. Understands Xarelto regimen. Feels confident to go home once she is medically cleared.  Objective: Temp:  [97.5 F (36.4 C)-98.9 F (37.2 C)] 98.5 F (36.9 C) (09/01 0556) Pulse Rate:  [103-105] 105 (09/01 0556) Resp:  [18] 18 (09/01 0556) BP: (118-133)/(83-95) 133/95 (09/01 0556) SpO2:  [96 %-100 %] 100 % (09/01 0556) Weight:  [222 lb 1.6 oz (100.7 kg)] 222 lb 1.6 oz (100.7 kg) (09/01 0556) Physical Exam: General: pleasant obese female sitting up in hospital bed, in no acute disterss Eyes: PERRL, EOMI ENTM: moist mucous membranes, oropharynx clear Cardiovascular: regular rate and rhythm, no m/r/g Respiratory: normal WOB, good air movement throughout, clear to auscultation bilaterally Abdomen: soft, non tender, non distended, +BS Skin: no rashes or wounds Extremities: +1 LE edema, no tenderness. Varicose veins  and skin peeling skin over feet Neuro: CN II-XII grossly intact Psych: mood and affect appropriate   Laboratory:  Recent Labs Lab 12/30/15 0559  12/31/15 0144 01/01/16 0222  WBC 10.2 11.9* 10.5  HGB 10.7* 9.9* 10.4*  HCT 37.1 34.6* 36.2  PLT 358 354 374    Recent Labs Lab 12/28/15 1509  12/29/15 0950 12/30/15 0559 12/31/15 0144 01/01/16 0222  NA 137  --  136 134* 133* 131*  K 4.7  --  4.0 4.0 4.6 4.6  CL 99  --  100* 96* 93* 91*  CO2 28  --  25 27 29 29   BUN 16  --  15 19 26* 25*  CREATININE 1.05  < > 1.03* 1.11* 1.30* 1.30*  CALCIUM 9.0  --  9.0 9.4 9.3 9.7  PROT 6.6  --  6.6  --   --   --   BILITOT 0.5  --  0.6  --   --   --   ALKPHOS 99  --  91  --   --   --   ALT 6  --  8*  --   --   --   AST 10  --  16  --   --   --   GLUCOSE 168*  --  207* 248* 237* 252*  < > = values in this interval not displayed. Imaging/Diagnostic Tests:  No results found. Tillman SersAngela C Riccio, DO 01/01/2016, 7:02 AM PGY-1, [facility] FPTS Intern pager: [phone], text pages welcome

## 2016-01-01 NOTE — Consult Note (Signed)
   [facility] CM Inpatient Consult   01/01/2016  Kaitlin Cantrell 1956-02-15 [id number]  Referral received to assess for care management services. Per chart review this patient is a 60 year old femalepresenting with shortness of breath found to have a pulmonary embolism. PMH is significant for COPD, tobacco use, DM, HTN, Arthritis, HLD, chronic pain syndrome. Patient is eligible through her [employer] plan.  This is her first admission.  Met with the patient regarding the benefits and introduced her to [facility] services.  Explained that [place] [facility] is a covered benefit of insurance. Review information for [facility] Care Management and a brochure was provided with contact information.  Explained that [place] [facility] does not interfere with or replace any services arranged by the inpatient care management staff.  Patient states she was unsure of needing a nurse to follow up.  She states she knows she needs to see her primary care provider in which she endorses as Dr. Delman Cheadle.   Patient states she was told that there is something wrong with her heart and she will need to see a cardiologist because of her test yesterday about her heart. She states, "I would like to take the information but I don't think I need that right now.  I drive, I get my medicines from [facility].  I thinK I am supposed to be on Xarelto.  I started on that yesterday. I am just waiting to hear from the cardiologist for now."  Information given and encouraged to call. Jonni Sanger from Cardiology came.  No further issues or concerns noted.  For questions or referral, please contact:  Natividad Brood, RN BSN [facility] Liaison  [phone] business mobile phone Toll free office [phone]

## 2016-01-02 DIAGNOSIS — N183 Chronic kidney disease, stage 3 (moderate): Secondary | ICD-10-CM

## 2016-01-02 DIAGNOSIS — I5041 Acute combined systolic (congestive) and diastolic (congestive) heart failure: Secondary | ICD-10-CM | POA: Diagnosis not present

## 2016-01-02 DIAGNOSIS — I2609 Other pulmonary embolism with acute cor pulmonale: Secondary | ICD-10-CM | POA: Diagnosis not present

## 2016-01-02 DIAGNOSIS — N289 Disorder of kidney and ureter, unspecified: Secondary | ICD-10-CM | POA: Diagnosis not present

## 2016-01-02 DIAGNOSIS — I429 Cardiomyopathy, unspecified: Secondary | ICD-10-CM | POA: Diagnosis not present

## 2016-01-02 DIAGNOSIS — N189 Chronic kidney disease, unspecified: Secondary | ICD-10-CM | POA: Diagnosis not present

## 2016-01-02 LAB — BASIC METABOLIC PANEL
Anion gap: 13 (ref 5–15)
BUN: 30 mg/dL — ABNORMAL HIGH (ref 6–20)
CALCIUM: 10.2 mg/dL (ref 8.9–10.3)
CO2: 31 mmol/L (ref 22–32)
CREATININE: 1.31 mg/dL — AB (ref 0.44–1.00)
Chloride: 89 mmol/L — ABNORMAL LOW (ref 101–111)
GFR calc non Af Amer: 44 mL/min — ABNORMAL LOW (ref >60)
GFR, EST AFRICAN AMERICAN: 51 mL/min — AB (ref >60)
GLUCOSE: 191 mg/dL — AB (ref 65–99)
Potassium: 4 mmol/L (ref 3.5–5.1)
Sodium: 133 mmol/L — ABNORMAL LOW (ref 135–145)

## 2016-01-02 LAB — GLUCOSE, CAPILLARY
GLUCOSE-CAPILLARY: 233 mg/dL — AB (ref 65–99)
GLUCOSE-CAPILLARY: 270 mg/dL — AB (ref 65–99)
Glucose-Capillary: 240 mg/dL — ABNORMAL HIGH (ref 65–99)
Glucose-Capillary: 144 mg/dL — ABNORMAL HIGH (ref 65–99)

## 2016-01-02 LAB — TSH: TSH: 4.54 u[IU]/mL — ABNORMAL HIGH (ref 0.350–4.500)

## 2016-01-02 LAB — T4, FREE: FREE T4: 0.84 ng/dL (ref 0.61–1.12)

## 2016-01-02 LAB — HIV ANTIBODY (ROUTINE TESTING W REFLEX): HIV SCREEN 4TH GENERATION: NONREACTIVE

## 2016-01-02 MED ORDER — CARVEDILOL 3.125 MG PO TABS: 3.1250 mg | ORAL_TABLET | Freq: Two times a day (BID) | ORAL | 1 refills | Status: DC

## 2016-01-02 MED ORDER — SACUBITRIL-VALSARTAN 24-26 MG PO TABS: 1.0000 | ORAL_TABLET | Freq: Two times a day (BID) | ORAL | 1 refills | Status: DC

## 2016-01-02 MED ORDER — FUROSEMIDE 10 MG/ML IJ SOLN
40.0000 mg | Freq: Two times a day (BID) | INTRAMUSCULAR | Status: DC
  Administered 2016-01-02 – 2016-01-03 (×2): 40 mg via INTRAVENOUS
  Filled 2016-01-02 (×2): qty 4

## 2016-01-02 MED ORDER — RIVAROXABAN 15 MG PO TABS: 15.0000 mg | ORAL_TABLET | Freq: Two times a day (BID) | ORAL | 0 refills | Status: DC

## 2016-01-02 MED ORDER — FUROSEMIDE 40 MG PO TABS: 40.0000 mg | ORAL_TABLET | Freq: Every day | ORAL | 1 refills | Status: DC

## 2016-01-02 MED ORDER — RIVAROXABAN 20 MG PO TABS: 20.0000 mg | ORAL_TABLET | Freq: Every day | ORAL | 1 refills | Status: DC

## 2016-01-02 NOTE — Progress Notes (Signed)
Refused bed alarm. Will continue to monitor patient. 

## 2016-01-02 NOTE — Progress Notes (Signed)
Family Medicine Teaching Service Daily Progress Note Intern Pager: [phone]  Patient name: Kaitlin Cantrell Medical record number: [id number] Date of birth: 1955/12/20 Age: 60 y.o. Gender: female  Primary Care Provider: Norberto Sorenson, MD Consultants: None Code Status: FULL  Pt Overview and Major Events to Date:  8/29: Admit for unprovoked submassive PE  8/30: Found to have EF 20-25% on ECHO    Assessment and Plan: Kaitlin Cantrell is a 60 y.o. female presenting with shortness of breath found to have a pulmonary embolism. PMH is significant for COPD, tobacco use, DM, HTN, Arthritis, HLD, chronic pain syndrome.  #Shortness of breath: Newly unprovoked submassive PE on CT angio. Treating for Acute CHF exacerbation as below.  -Transitioned to oral anticoagulation 8/31. Xarelto 15 bid x21 days followed by 20 once daily to complete 6 month course.  #Acute HFrEF: -Echo done due to heart strain. Showed moderate LVH. EF 20-25% with diffuse hypokinesis, G3DD. Patient with 6.5L UOP and 8 lb weight loss in 24 hours. No signs of acute volume overload on exam.  - cardiology consulted, appreciate recs >> Entresto 24/26, Coreg 3.125 mg BID,  -transition from IV lasix to 40 mg PO lasix daily   #COPD: Stable, no evidence of COPD exacerbation. Pt uses symbicort, atrovent, dulera at home.  -xopenex, atrovent, dulera  #HTN: Normotensive -cards adjusted medications to gear towards CHF treatment  #HLD- Last LDL 87 on 10/01/2015 -continue home lovastatin  #DM: On glimepiride, metformin at home. HgA1C 7.1 05/2015. Am glucose 248. -SSI ACHS  #GERD: stable  -pepcid  #Arthritis/Chronic pain syndrome: History of back surgery. Tramadol, Norco at home. On review of [facility] data base last received Norco 7.5-325 # 90 on 12/04/2015 and tramadol 50 mg # 240 on 8/4//2017 -Continued home norco, gabapentin, flexeril, tramadol  #Tobacco abuse: Pt smokes 0.5-1.0 packs per day. Has tried quitting in the past one  time 20 years ago. Is interested in quitting if she can. -nicotine patch -smoking cessation counseling performed  FEN/GI: heart healthy diet, home pepcid, SLIV Prophylaxis: lovenox treatment  Disposition: Admitted to tele, likely home today  Subjective:  Feeling well this morning, denies chest pain and shortness of breath. No concerns or complaints.   Objective: Temp:  [98.3 F (36.8 C)-98.6 F (37 C)] 98.6 F (37 C) (09/02 0500) Pulse Rate:  [88-98] 98 (09/02 0500) Resp:  [20] 20 (09/02 0500) BP: (115-133)/(80-83) 133/80 (09/02 0500) SpO2:  [95 %-98 %] 98 % (09/02 0500) Weight:  [214 lb 14.4 oz (97.5 kg)] 214 lb 14.4 oz (97.5 kg) (09/02 0300) Physical Exam: General: pleasant obese female lying in hospital bed, in no acute disterss Eyes: PERRL, EOMI ENTM: moist mucous membranes, oropharynx clear Cardiovascular: regular rate and rhythm, no m/r/g Respiratory: normal WOB, good air movement throughout, clear to auscultation bilaterally Abdomen: soft, non tender, non distended, +BS Skin: no rashes or wounds Extremities: Trace pitting LE edema to shins, no tenderness. Varicose veins and skin peeling skin over feet Neuro: CN II-XII grossly intact Psych: mood and affect appropriate   Laboratory:  Recent Labs Lab 12/30/15 0559 12/31/15 0144 01/01/16 0222  WBC 10.2 11.9* 10.5  HGB 10.7* 9.9* 10.4*  HCT 37.1 34.6* 36.2  PLT 358 354 374    Recent Labs Lab 12/28/15 1509 12/29/15 0950  12/31/15 0144 01/01/16 0222 01/02/16 0148  NA 137 136  < > 133* 131* 133*  K 4.7 4.0  < > 4.6 4.6 4.0  CL 99 100*  < > 93* 91* 89*  CO2  28 25  < > 29 29 31   BUN 16 15  < > 26* 25* 30*  CREATININE 1.05 1.03*  < > 1.30* 1.30* 1.31*  CALCIUM 9.0 9.0  < > 9.3 9.7 10.2  PROT 6.6 6.6  --   --   --   --   BILITOT 0.5 0.6  --   --   --   --   ALKPHOS 99 91  --   --   --   --   ALT 6 8*  --   --   --   --   AST 10 16  --   --   --   --   GLUCOSE 168* 207*  < > 237* 252* 191*  < > = values in  this interval not displayed. Imaging/Diagnostic Tests:  Mr Card Morphology Wo/w Cm  Result Date: 01/01/2016 CLINICAL DATA:  Cardiomyopathy of uncertain etiology. EXAM: CARDIAC MRI TECHNIQUE: The patient was scanned on a 1.5 Tesla GE magnet. A dedicated cardiac coil was used. Functional imaging was done using Fiesta sequences. 2,3, and 4 chamber views were done to assess for RWMA's. Modified Simpson's rule using a short axis stack was used to calculate an ejection fraction on a dedicated work Research officer, trade unionstation using Circle software. The patient received 29 cc of Multihance. After 10 minutes inversion recovery sequences were used to assess for infiltration and scar tissue. CONTRAST:  29 cc Multihance FINDINGS: Small left pleural effusion.  Trivial pericardial effusion. Moderately dilated left ventricle with severe diffuse hypokinesis. EF 15%. Normal wall thickness. The right ventricle was mildly dilated with moderately decreased systolic function. There was moderate left atrial enlargement and mild right atrial enlargement. There was moderate tricuspid regurgitation. There was at least moderate central mitral regurgitation. Flow sequences to quantify were not done. Delayed enhancement images were difficult due to respiratory artifact. However, there was no definite late gadolinium enhancement (LGE) visualized. MEASUREMENTS: MEASUREMENTS LV EDV 297 mL LV SV 44 mL LV EF 15% IMPRESSION: 1. Moderately dilated left ventricle with severe diffuse hypokinesis, EF 15%. 2. Mildly dilated right ventricle with moderately decreased systolic function. 3.  Moderate TR and moderate MR. 4. Difficult images, but no definite LGE noted. Therefore, no definitive evidence for prior MI, myocarditis, or infiltrative disease. Dalton Mclean Electronically Signed   By: Marca Anconaalton  Mclean M.D.   On: 01/01/2016 17:27   Arvilla Marketatherine Lauren Aleksander Edmiston, DO 01/02/2016, 8:28 AM PGY-2, [facility] FPTS Intern pager: [phone], text pages  welcome

## 2016-01-02 NOTE — Progress Notes (Signed)
Primary cardiologist: Dr. Marca Anconaalton McLean (CHF)  Seen for followup: Cardiomyopathy with CHF, pulmonary embolus  Subjective:    Improving leg edema. No chest pain. No breathlessness at rest. Does feel like she is getting better.  Objective:   Temp:  [98.3 F (36.8 C)-98.6 F (37 C)] 98.6 F (37 C) (09/02 0500) Pulse Rate:  [88-98] 98 (09/02 0500) Resp:  [20] 20 (09/02 0500) BP: (115-133)/(80-83) 133/80 (09/02 0500) SpO2:  [95 %-98 %] 98 % (09/02 0500) Weight:  [214 lb 14.4 oz (97.5 kg)] 214 lb 14.4 oz (97.5 kg) (09/02 0300) Last BM Date: 12/30/15  Filed Weights   12/31/15 0548 01/01/16 0556 01/02/16 0300  Weight: 222 lb 14.4 oz (101.1 kg) 222 lb 1.6 oz (100.7 kg) 214 lb 14.4 oz (97.5 kg)    Intake/Output Summary (Last 24 hours) at 01/02/16 0834 Last data filed at 01/02/16 0604  Gross per 24 hour  Intake              838 ml  Output             6550 ml  Net            -5712 ml    Telemetry: Sinus rhythm with PVCs and brief bursts of NSVT.  Exam:  General: Overweight woman, no distress.  HEENT: Oropharynx clear.  Lungs: Decreased breath sounds at the left base.  Cardiac: Indistinct PMI, RRR without gallop, elevated JVP.  Abdomen: Protuberant, nontender.  Extremities: 2+ leg edema, improving per patient.  Lab Results:  Basic Metabolic Panel:  Recent Labs Lab 12/31/15 0144 01/01/16 0222 01/02/16 0148  NA 133* 131* 133*  K 4.6 4.6 4.0  CL 93* 91* 89*  CO2 29 29 31   GLUCOSE 237* 252* 191*  BUN 26* 25* 30*  CREATININE 1.30* 1.30* 1.31*  CALCIUM 9.3 9.7 10.2    Liver Function Tests:  Recent Labs Lab 12/28/15 1509 12/29/15 0950  AST 10 16  ALT 6 8*  ALKPHOS 99 91  BILITOT 0.5 0.6  PROT 6.6 6.6  ALBUMIN 3.6 3.0*    CBC:  Recent Labs Lab 12/30/15 0559 12/31/15 0144 01/01/16 0222  WBC 10.2 11.9* 10.5  HGB 10.7* 9.9* 10.4*  HCT 37.1 34.6* 36.2  MCV 75.3* 75.7* 75.9*  PLT 358 354 374    Cardiac Enzymes:  Recent Labs Lab  12/31/15 0144 12/31/15 0748 12/31/15 1249  TROPONINI 0.06* 0.06* 0.06*    Echocardiogram 12/30/2015: Study Conclusions  - Left ventricle: The cavity size was mildly dilated. Wall   thickness was increased in a pattern of moderate LVH. Systolic   function was severely reduced. The estimated ejection fraction   was in the range of 20% to 25%. Diffuse hypokinesis. There was   fusion of early and atrial contributions to ventricular filling.   Doppler parameters are consistent with a reversible restrictive   pattern, indicative of decreased left ventricular diastolic   compliance and/or increased left atrial pressure (grade 3   diastolic dysfunction). - Mitral valve: There was moderate regurgitation. - Left atrium: The atrium was moderately dilated. - Right ventricle: The cavity size was mildly dilated. Systolic   function was mildly reduced. - Right atrium: The atrium was moderately dilated. - Tricuspid valve: There was moderate regurgitation. - Pulmonary arteries: Systolic pressure was moderately increased.   PA peak pressure: 54 mm Hg (S). - Pericardium, extracardiac: There was a left pleural effusion.  Cardiac MRI 01/01/2016: IMPRESSION: 1. Moderately dilated left ventricle with severe diffuse hypokinesis,  EF 15%.  2. Mildly dilated right ventricle with moderately decreased systolic function.  3.  Moderate TR and moderate MR.  4. Difficult images, but no definite LGE noted. Therefore, no definitive evidence for prior MI, myocarditis, or infiltrative disease.   Medications:   Scheduled Medications: . carvedilol  3.125 mg Oral BID WC  . docusate sodium  100 mg Oral Daily  . famotidine  40 mg Oral BID  . furosemide  60 mg Intravenous BID  . gabapentin  300 mg Oral TID  . insulin aspart  0-15 Units Subcutaneous TID WC  . insulin aspart  0-5 Units Subcutaneous QHS  . ipratropium  2.5 mL Inhalation BID  . mometasone-formoterol  2 puff Inhalation BID  . nicotine  21 mg  Transdermal Daily  . pravastatin  40 mg Oral q1800  . Rivaroxaban  15 mg Oral BID WC  . [START ON 01/21/2016] rivaroxaban  20 mg Oral Daily  . sacubitril-valsartan  1 tablet Oral BID  . sodium chloride flush  3 mL Intravenous Q12H      PRN Medications:  cyclobenzaprine, diazepam, HYDROcodone-acetaminophen, traMADol   Assessment:   1. Dilated cardiomyopathy with LVEF 15%, nonischemic. No clear evidence of prior infarct, infiltrative disorder, or myocarditis by cardiac MRI. She is being initiated on basic medical therapy including IV diuresis for improvement of volume status. Seen by Dr. Shirlee Latch yesterday.  2. Acute on chronic combined heart failure, restrictive diastolic filling pattern also noted by echocardiogram. She is diuresing briskly on IV Lasix with stable creatinine in 1.3 range.  3. CKD stage 3, creatinine 1.3.  4. Submassive pulmonary embolus with RV strain. She is on Xarelto, no obvious trigger. She has not had a hypercoagulability workup.  5. Moderate mitral and tricuspid regurgitation. PASP was 54 mmHg by echocardiography.  6. Hyperlipidemia, currently on Pravachol.  Plan/Discussion:    Continue IV diuresis, watch urine output and creatinine. She is now on low-dose Coreg and was started on Entresto today, blood pressure stable. Once volume status further improved, will up titrate Coreg and likely add Aldactone with a reduction in Lasix dose. Do not plan to push for further invasive cardiac testing at this time in light of submassive pulmonary embolus and need for anticoagulation at this point.   Jonelle Sidle, M.D., F.A.C.C.

## 2016-01-03 DIAGNOSIS — R600 Localized edema: Secondary | ICD-10-CM

## 2016-01-03 DIAGNOSIS — N189 Chronic kidney disease, unspecified: Secondary | ICD-10-CM | POA: Diagnosis not present

## 2016-01-03 DIAGNOSIS — N289 Disorder of kidney and ureter, unspecified: Secondary | ICD-10-CM

## 2016-01-03 DIAGNOSIS — I2609 Other pulmonary embolism with acute cor pulmonale: Secondary | ICD-10-CM | POA: Diagnosis not present

## 2016-01-03 DIAGNOSIS — N183 Chronic kidney disease, stage 3 (moderate): Secondary | ICD-10-CM | POA: Diagnosis not present

## 2016-01-03 DIAGNOSIS — I5041 Acute combined systolic (congestive) and diastolic (congestive) heart failure: Secondary | ICD-10-CM | POA: Diagnosis not present

## 2016-01-03 DIAGNOSIS — I429 Cardiomyopathy, unspecified: Secondary | ICD-10-CM | POA: Diagnosis not present

## 2016-01-03 LAB — BASIC METABOLIC PANEL
ANION GAP: 9 (ref 5–15)
BUN: 37 mg/dL — ABNORMAL HIGH (ref 6–20)
CO2: 34 mmol/L — AB (ref 22–32)
Calcium: 9.7 mg/dL (ref 8.9–10.3)
Chloride: 90 mmol/L — ABNORMAL LOW (ref 101–111)
Creatinine, Ser: 1.53 mg/dL — ABNORMAL HIGH (ref 0.44–1.00)
GFR calc Af Amer: 42 mL/min — ABNORMAL LOW (ref >60)
GFR calc non Af Amer: 36 mL/min — ABNORMAL LOW (ref >60)
GLUCOSE: 215 mg/dL — AB (ref 65–99)
POTASSIUM: 4.3 mmol/L (ref 3.5–5.1)
Sodium: 133 mmol/L — ABNORMAL LOW (ref 135–145)

## 2016-01-03 LAB — CULTURE, BLOOD (ROUTINE X 2)
CULTURE: NO GROWTH
CULTURE: NO GROWTH

## 2016-01-03 LAB — GLUCOSE, CAPILLARY
GLUCOSE-CAPILLARY: 242 mg/dL — AB (ref 65–99)
Glucose-Capillary: 247 mg/dL — ABNORMAL HIGH (ref 65–99)
Glucose-Capillary: 189 mg/dL — ABNORMAL HIGH (ref 65–99)
Glucose-Capillary: 254 mg/dL — ABNORMAL HIGH (ref 65–99)

## 2016-01-03 NOTE — Progress Notes (Signed)
Pt refused bed alarm on, educated on patient's safety plan. Will continue to do hourly rounding. 

## 2016-01-03 NOTE — Progress Notes (Signed)
Primary cardiologist: Dr. Marca Anconaalton McLean (CHF)  Seen for followup: Cardiomyopathy with CHF, pulmonary embolus  Subjective:    Less short of breath. No chest pain. She did ambulate in the hall but got fatigued about three quarters of the way. Leg edema improved.  Objective:   Temp:  [98 F (36.7 C)-98.8 F (37.1 C)] 98 F (36.7 C) (09/03 [id number]) Pulse Rate:  [75-98] 75 (09/03 0613) Resp:  [20] 20 (09/03 0613) BP: (97-117)/(55-81) 100/60 (09/03 0813) SpO2:  [98 %-100 %] 100 % (09/03 [id number]) Weight:  [212 lb 9.6 oz (96.4 kg)] 212 lb 9.6 oz (96.4 kg) (09/03 0613) Last BM Date: 12/30/15  Filed Weights   01/01/16 0556 01/02/16 0300 01/03/16 [id number]  Weight: 222 lb 1.6 oz (100.7 kg) 214 lb 14.4 oz (97.5 kg) 212 lb 9.6 oz (96.4 kg)    Intake/Output Summary (Last 24 hours) at 01/03/16 1049 Last data filed at 01/03/16 0900  Gross per 24 hour  Intake              720 ml  Output             3550 ml  Net            -2830 ml    Telemetry: Sinus rhythm with PVCs and brief bursts of NSVT.  Exam:  General: Overweight woman, no distress.  HEENT: Oropharynx clear.  Lungs: Decreased breath sounds at the left base.  Cardiac: Indistinct PMI, RRR without gallop, elevated JVP.  Abdomen: Protuberant, nontender.  Extremities: Improving leg edema.  Lab Results:  Basic Metabolic Panel:  Recent Labs Lab 01/01/16 0222 01/02/16 0148 01/03/16 0319  NA 131* 133* 133*  K 4.6 4.0 4.3  CL 91* 89* 90*  CO2 29 31 34*  GLUCOSE 252* 191* 215*  BUN 25* 30* 37*  CREATININE 1.30* 1.31* 1.53*  CALCIUM 9.7 10.2 9.7    Liver Function Tests:  Recent Labs Lab 12/28/15 1509 12/29/15 0950  AST 10 16  ALT 6 8*  ALKPHOS 99 91  BILITOT 0.5 0.6  PROT 6.6 6.6  ALBUMIN 3.6 3.0*    CBC:  Recent Labs Lab 12/30/15 0559 12/31/15 0144 01/01/16 0222  WBC 10.2 11.9* 10.5  HGB 10.7* 9.9* 10.4*  HCT 37.1 34.6* 36.2  MCV 75.3* 75.7* 75.9*  PLT 358 354 374    Cardiac Enzymes:  Recent  Labs Lab 12/31/15 0144 12/31/15 0748 12/31/15 1249  TROPONINI 0.06* 0.06* 0.06*    Echocardiogram 12/30/2015: Study Conclusions  - Left ventricle: The cavity size was mildly dilated. Wall   thickness was increased in a pattern of moderate LVH. Systolic   function was severely reduced. The estimated ejection fraction   was in the range of 20% to 25%. Diffuse hypokinesis. There was   fusion of early and atrial contributions to ventricular filling.   Doppler parameters are consistent with a reversible restrictive   pattern, indicative of decreased left ventricular diastolic   compliance and/or increased left atrial pressure (grade 3   diastolic dysfunction). - Mitral valve: There was moderate regurgitation. - Left atrium: The atrium was moderately dilated. - Right ventricle: The cavity size was mildly dilated. Systolic   function was mildly reduced. - Right atrium: The atrium was moderately dilated. - Tricuspid valve: There was moderate regurgitation. - Pulmonary arteries: Systolic pressure was moderately increased.   PA peak pressure: 54 mm Hg (S). - Pericardium, extracardiac: There was a left pleural effusion.  Cardiac MRI 01/01/2016: IMPRESSION: 1. Moderately dilated left  ventricle with severe diffuse hypokinesis, EF 15%.  2. Mildly dilated right ventricle with moderately decreased systolic function.  3.  Moderate TR and moderate MR.  4. Difficult images, but no definite LGE noted. Therefore, no definitive evidence for prior MI, myocarditis, or infiltrative disease.   Medications:   Scheduled Medications: . carvedilol  3.125 mg Oral BID WC  . docusate sodium  100 mg Oral Daily  . famotidine  40 mg Oral BID  . gabapentin  300 mg Oral TID  . insulin aspart  0-15 Units Subcutaneous TID WC  . insulin aspart  0-5 Units Subcutaneous QHS  . ipratropium  2.5 mL Inhalation BID  . mometasone-formoterol  2 puff Inhalation BID  . nicotine  21 mg Transdermal Daily  .  pravastatin  40 mg Oral q1800  . Rivaroxaban  15 mg Oral BID WC  . [START ON 01/21/2016] rivaroxaban  20 mg Oral Daily  . sacubitril-valsartan  1 tablet Oral BID  . sodium chloride flush  3 mL Intravenous Q12H     PRN Medications: cyclobenzaprine, diazepam, HYDROcodone-acetaminophen, traMADol   Assessment:   1. Dilated cardiomyopathy with LVEF 15%, nonischemic. No clear evidence of prior infarct, infiltrative disorder, or myocarditis by cardiac MRI.   2. Acute on chronic combined heart failure, restrictive diastolic filling pattern also noted by echocardiogram. She has diuresed around 9 L in the last 48 hours on IV Lasix, dose cut back yesterday afternoon. Creatinine has bumped up from 1.3 to 1.5.  3. CKD stage 3, creatinine 1.5.  4. Submassive pulmonary embolus with RV strain. She is on Xarelto, no obvious trigger. She has not had a hypercoagulability workup.  5. Moderate mitral and tricuspid regurgitation. PASP was 54 mmHg by echocardiography.  6. Hyperlipidemia, currently on Pravachol.  Plan/Discussion:    Would continue Coreg and Entresto for now. I am stopping her IV Lasix today and at this point would not start oral diuretic as yet. Follow-up BMET in the morning. There is some diuretic response with Entresto, and with her current blood pressure and bump in creatinine, we will need to hold off before deciding about an oral diuretic dose plus minus Aldactone. Would not push Coreg further at this time either. Encourage ambulation, hopefully nearing discharge once stable oral regimen is determined and then she will need close follow-up in the heart failure clinic with Dr. Shirlee Latch.  Jonelle Sidle, M.D., F.A.C.C.

## 2016-01-03 NOTE — Progress Notes (Signed)
Family Medicine Teaching Service Daily Progress Note Intern Pager: [phone]  Patient name: Kaitlin BlalockDonna R Cantrell Medical record number: [id number] Date of birth: 04/05/1956 Age: 60 y.o. Gender: female  Primary Care Provider: Norberto SorensonSHAW,EVA, MD Consultants: cardiology Code Status: FULL  Pt Overview and Major Events to Date:  8/29: Admit for unprovoked submassive PE  8/30: Found to have EF 20-25% on ECHO   Assessment and Plan: Kaitlin GerlachDonna R McClintockis a 60 y.o.femalepresenting with shortness of breath found to have a pulmonary embolism. PMH is significant for COPD, tobacco use, DM, HTN, Arthritis, HLD, chronic pain syndrome.  #Shortness of breath: Newly unprovoked submassive PE on CT angio. Treating for Acute CHF exacerbation as below.  -Transitioned to oral anticoagulation 8/31. Xarelto 15 bid x21 days followed by 20 once daily to complete 6 month course.   #Acute HFrEF: Echo done due to heart strain. Showed moderate LVH. EF 20-25% with diffuse hypokinesis, G3DD. Patient with 4.1L UOP in 24 hours. Exam 1+ BLE.  -cardiology consulted, appreciate recs >> Entresto 24/26, Coreg 3.125 mg BID, consider titrating up coreg and adding aldactone in the future -transition from IV lasix to 40 mg PO lasix daily  #AKI: Cr 1.05 on admission. Trend 1.11>1.30>1.31>1.53. Likely in the setting of IV lasix use.  -continue to monitor BMP  #COPD: Stable, no evidence of COPD exacerbation.Pt uses symbicort, atrovent, duleraat home.  -xopenex, atrovent, dulera  #HTN:Normotensive -see cards recs above, currently on coreg 3.125mg  and entresto 24/26  #HLD-Last LDL 87 on 10/01/2015 -continue home lovastatin  #DM: On glimepiride, metformin at home. HgA1C 7.1 05/2015. Used 12 units over past 24 hours. -SSI ACHS  #GERD: stable -pepcid  #Arthritis/Chronic pain syndrome: History of back surgery. Tramadol, Norco at home. On review of  Drug data base last received Norco 7.5-325 # 90 on 12/04/2015 and tramadol 50  mg # 240 on 8/4//2017 -Continued home norco, gabapentin, flexeril, tramadol  #Tobacco abuse: Pt smokes 0.5-1.0 packs per day. Has tried quitting in the past one time 20 years ago. Is interested in quitting if she can. Of note, her husband also smokes and this makes it harder for her to quit. -nicotine patch -smoking cessation counseling performed, suggested she discuss Chantix with Dr. Clelia CroftShaw  FEN/GI:heart healthy diet, home pepcid, SLIV Prophylaxis:lovenox treatment  Disposition: possibly home today  Subjective:  Pt resting comfortably. Would love to go home today. Lower extremity edema better in from her perspective.  Objective: Temp:  [98 F (36.7 C)-98.8 F (37.1 C)] 98 F (36.7 C) (09/03 28410613) Pulse Rate:  [75-98] 75 (09/03 0613) Resp:  [20] 20 (09/03 0613) BP: (97-117)/(55-81) 97/55 (09/03 0613) SpO2:  [98 %-100 %] 100 % (09/03 32440613) Weight:  [212 lb 9.6 oz (96.4 kg)] 212 lb 9.6 oz (96.4 kg) (09/03 [id number]) Physical Exam: General: NAD, sitting on edge of bed. Has been walking about. Cardiovascular: rrr, no m/r/g Respiratory: CTAB, good air movement Abdomen: SNTND Extremities: 1+ bilateral lower extremity edema  Laboratory:  Recent Labs Lab 12/30/15 0559 12/31/15 0144 01/01/16 0222  WBC 10.2 11.9* 10.5  HGB 10.7* 9.9* 10.4*  HCT 37.1 34.6* 36.2  PLT 358 354 374    Recent Labs Lab 12/28/15 1509 12/29/15 0950  01/01/16 0222 01/02/16 0148 01/03/16 0319  NA 137 136  < > 131* 133* 133*  K 4.7 4.0  < > 4.6 4.0 4.3  CL 99 100*  < > 91* 89* 90*  CO2 28 25  < > 29 31 34*  BUN 16 15  < > 25*  30* 37*  CREATININE 1.05 1.03*  < > 1.30* 1.31* 1.53*  CALCIUM 9.0 9.0  < > 9.7 10.2 9.7  PROT 6.6 6.6  --   --   --   --   BILITOT 0.5 0.6  --   --   --   --   ALKPHOS 99 91  --   --   --   --   ALT 6 8*  --   --   --   --   AST 10 16  --   --   --   --   GLUCOSE 168* 207*  < > 252* 191* 215*  < > = values in this interval not displayed.   [name],  MD 01/03/2016, 7:03 AM PGY-1, [facility] Family Medicine FPTS Intern pager: [phone], text pages welcome

## 2016-01-04 DIAGNOSIS — N289 Disorder of kidney and ureter, unspecified: Secondary | ICD-10-CM | POA: Diagnosis not present

## 2016-01-04 DIAGNOSIS — I2609 Other pulmonary embolism with acute cor pulmonale: Secondary | ICD-10-CM | POA: Diagnosis not present

## 2016-01-04 DIAGNOSIS — N189 Chronic kidney disease, unspecified: Secondary | ICD-10-CM | POA: Diagnosis not present

## 2016-01-04 DIAGNOSIS — I429 Cardiomyopathy, unspecified: Secondary | ICD-10-CM | POA: Diagnosis not present

## 2016-01-04 DIAGNOSIS — N183 Chronic kidney disease, stage 3 (moderate): Secondary | ICD-10-CM | POA: Diagnosis not present

## 2016-01-04 DIAGNOSIS — I5041 Acute combined systolic (congestive) and diastolic (congestive) heart failure: Secondary | ICD-10-CM | POA: Diagnosis not present

## 2016-01-04 LAB — GLUCOSE, CAPILLARY
GLUCOSE-CAPILLARY: 210 mg/dL — AB (ref 65–99)
GLUCOSE-CAPILLARY: 176 mg/dL — AB (ref 65–99)
GLUCOSE-CAPILLARY: 170 mg/dL — AB (ref 65–99)
GLUCOSE-CAPILLARY: 171 mg/dL — AB (ref 65–99)

## 2016-01-04 LAB — BASIC METABOLIC PANEL
ANION GAP: 8 (ref 5–15)
BUN: 42 mg/dL — ABNORMAL HIGH (ref 6–20)
CHLORIDE: 90 mmol/L — AB (ref 101–111)
CO2: 33 mmol/L — AB (ref 22–32)
Calcium: 9.2 mg/dL (ref 8.9–10.3)
Creatinine, Ser: 1.51 mg/dL — ABNORMAL HIGH (ref 0.44–1.00)
GFR calc non Af Amer: 37 mL/min — ABNORMAL LOW (ref >60)
GFR, EST AFRICAN AMERICAN: 43 mL/min — AB (ref >60)
Glucose, Bld: 240 mg/dL — ABNORMAL HIGH (ref 65–99)
Potassium: 4.6 mmol/L (ref 3.5–5.1)
Sodium: 131 mmol/L — ABNORMAL LOW (ref 135–145)

## 2016-01-04 LAB — T3, FREE: T3, Free: 2.9 pg/mL (ref 2.0–4.4)

## 2016-01-04 LAB — HEPATITIS C ANTIBODY

## 2016-01-04 MED ORDER — SPIRONOLACTONE 25 MG PO TABS
12.5000 mg | ORAL_TABLET | Freq: Once | ORAL | Status: AC
  Administered 2016-01-04: 12.5 mg via ORAL
  Filled 2016-01-04: qty 1

## 2016-01-04 MED ORDER — TERBINAFINE HCL 1 % EX CREA
TOPICAL_CREAM | Freq: Two times a day (BID) | CUTANEOUS | Status: DC
  Administered 2016-01-04: 22:00:00 via TOPICAL
  Administered 2016-01-05: 1 via TOPICAL
  Filled 2016-01-04: qty 12

## 2016-01-04 NOTE — Progress Notes (Signed)
FPTS Interim Progress Note  S: Increased pain over bilateral feet and toes  O: BP 103/69 (BP Location: Left Arm)   Pulse 95   Temp 97.5 F (36.4 C) (Oral)   Resp 18   Ht 5\' 6"  (1.676 m)   Wt 213 lb 11.2 oz (96.9 kg) Comment: a scale  SpO2 99%   BMI 34.49 kg/m   General: NAD, sitting up at the edge of bed Extremities: slight LE edema, tenderness of lower extremities at the distal mid foot and digits bilaterally. Varicose veins and skin peeling over feet/toes. Erythema, blanching, over the affected areas. [See photos below]       A/P: Lower extremity rash: Initial concern about possible drug reaction. Patient states that rash was first noticed this morning with new onset of pain. Looking through the progress notes, physical exam on 12/30/15 notes general skin peeling over her feet. Because of this etiology is deemed more likely to be fungal in nature. Differential continues to include drug reaction and should be treated as such if patient has no initial response to antifungals. Differential also includes vascular disease but swift capillary refill and extremity warmth makes this less likely. - Antifungal per pharmacy (discussed this entire case with them and pharmacist is "looking into what is available" regarding antifungals) - Per pharmacy: If deemed drug reaction the most likely culprit is the patient's newly started Xarelto, as this medication seems to have a more significant dermatologic history. - Will monitor   Kathee Delton, MD 01/04/2016, 8:40 PM PGY-3, [facility] Family Medicine Service pager [phone]

## 2016-01-04 NOTE — Progress Notes (Signed)
Inpatient Diabetes Program Recommendations  AACE/ADA: New Consensus Statement on Inpatient Glycemic Control (2015)  Target Ranges:  Prepandial:   less than 140 mg/dL      Peak postprandial:   less than 180 mg/dL (1-2 hours)      Critically ill patients:  140 - 180 mg/dL   Lab Results  Component Value Date   GLUCAP 210 (H) 01/04/2016   HGBA1C 6.9 10/01/2015    Review of Glycemic Control  Diabetes history: DM2 Outpatient Diabetes medications: Amaryl 2 mg BID, Metformin 1000 mg BID Current orders for Inpatient glycemic control: Novolog 0-15 units TID with meals, Novolog 0-5 units QHS  Inpatient Diabetes Program Recommendations: Insulin - Basal: Please consider ordering low dose basal insulin. Recommend starting with Levemir 10 units Q24H (based on 101 kg x 0.1 units).  Will follow. Thank you. Ailene Ards, RD, LDN, CDE Inpatient Diabetes Coordinator [phone]

## 2016-01-04 NOTE — Progress Notes (Signed)
Family Medicine Teaching Service Daily Progress Note Intern Pager: [phone]  Patient name: Kaitlin Cantrell Medical record number: [id number] Date of birth: 1956/01/13 Age: 60 y.o. Gender: female  Primary Care Provider: Norberto Sorenson, MD Consultants: cardiology Code Status: FULL  Pt Overview and Major Events to Date:  8/29: Admit for unprovoked submassive PE  8/30: Found to have EF 20-25% on ECHO   Assessment and Plan: Kaitlin Roder McClintockis a 60 y.o.femalepresenting with shortness of breath found to have a pulmonary embolism. PMH is significant for COPD, tobacco use, DM, HTN, Arthritis, HLD, chronic pain syndrome.  #Shortness of breath: Newly unprovoked submassive PE on CT angio. Treating for Acute CHF exacerbation as below.  -Transitioned to oral anticoagulation 8/31. Xarelto 15 bid x21 days followed by 20 once daily to complete 6 month course.   #Acute HFrEF: Echo done due to heart strain. Showed moderate LVH. EF 20-25% with diffuse hypokinesis, G3DD. Patient with 4.1L UOP on 9/2, trace edema BLE today -cardiology consulted, appreciate recs >> Entresto 24/26, Coreg 3.125 mg BID, consider titrating up coreg and adding aldactone in the future -held Lasix yesterday for bump in creatinine, will reassess/implement oral diuretic management per cards recs  #AKI: Cr 1.05 on admission. Trend 1.11>1.30>1.31>1.53>1.51. Likely in the setting of IV lasix use. Diuresis held 9/3. -continue to monitor BMP  #COPD: Stable, no evidence of COPD exacerbation.Pt uses symbicort, atrovent, duleraat home.  -xopenex, atrovent, dulera  #HTN:Normotensive -see cards recs above, currently on coreg 3.125mg  and entresto 24/26  #HLD-Last LDL 87 on 10/01/2015 -continue home lovastatin  #DM: On glimepiride, metformin at home. HgA1C 7.1 05/2015. Used 12 units over past 24 hours. -SSI ACHS  #GERD: stable -pepcid  #Arthritis/Chronic pain syndrome: History of back surgery. Tramadol, Norco at home. On  review of [employer] [facility] data base last received Norco 7.5-325 # 90 on 12/04/2015 and tramadol 50 mg # 240 on 8/4//2017 -Continued home norco, gabapentin, flexeril, tramadol  #Tobacco abuse: Pt smokes 0.5-1.0 packs per day. Has tried quitting in the past one time 20 years ago. Is interested in quitting if she can. Of note, her husband also smokes and this makes it harder for her to quit. -nicotine patch -smoking cessation counseling performed, suggested she discuss Chantix with Dr. Clelia Croft  FEN/GI:heart healthy diet, home pepcid, SLIV Prophylaxis:lovenox treatment  Disposition: possibly home today pending cards clearance  Subjective:  Pt sitting up in bed, feels very well. Denies chest pain, shortness of breath. Was able to walk up and down the halls without dyspnea, but felt tired. Understands she may stay again overnight depending on cardiology recommendations.   Objective: Temp:  [97.5 F (36.4 C)-97.8 F (36.6 C)] 97.8 F (36.6 C) (09/04 0612) Pulse Rate:  [89-92] 92 (09/04 0612) Resp:  [18-20] 18 (09/04 0612) BP: (98-106)/(60-78) 106/78 (09/04 0612) SpO2:  [94 %-100 %] 94 % (09/04 0612) Weight:  [213 lb 11.2 oz (96.9 kg)] 213 lb 11.2 oz (96.9 kg) (09/04 0612) Physical Exam: General: NAD, sitting on edge of bed. Has been walking about. Cardiovascular: rrr, no m/r/g Respiratory: CTAB, good air movement Abdomen: SNTND Extremities: trace bilateral lower extremity edema  Laboratory:  Recent Labs Lab 12/30/15 0559 12/31/15 0144 01/01/16 0222  WBC 10.2 11.9* 10.5  HGB 10.7* 9.9* 10.4*  HCT 37.1 34.6* 36.2  PLT 358 354 374    Recent Labs Lab 12/28/15 1509 12/29/15 0950  01/02/16 0148 01/03/16 0319 01/04/16 0206  NA 137 136  < > 133* 133* 131*  K 4.7 4.0  < >  4.0 4.3 4.6  CL 99 100*  < > 89* 90* 90*  CO2 28 25  < > 31 34* 33*  BUN 16 15  < > 30* 37* 42*  CREATININE 1.05 1.03*  < > 1.31* 1.53* 1.51*  CALCIUM 9.0 9.0  < > 10.2 9.7 9.2  PROT 6.6 6.6  --   --   --   --    BILITOT 0.5 0.6  --   --   --   --   ALKPHOS 99 91  --   --   --   --   ALT 6 8*  --   --   --   --   AST 10 16  --   --   --   --   GLUCOSE 168* 207*  < > 191* 215* 240*  < > = values in this interval not displayed.   Tillman SersAngela C Garik Diamant, DO 01/04/2016, 6:19 AM PGY-1, [place] Family [facility] FPTS Intern pager: [phone], text pages welcome

## 2016-01-04 NOTE — Progress Notes (Signed)
Pt's feet are swollen and red, pt verbalized that it's more swollen than earlier during lunch time, pedal pulses palpable but diminished. Family medicine on call notified, awaiting call back.

## 2016-01-04 NOTE — Progress Notes (Signed)
Primary cardiologist: Dr. Marca Anconaalton McLean (CHF)  Seen for followup: Cardiomyopathy with CHF, pulmonary embolus  Subjective:    No chest pain. Ambulated in the hall yesterday the entire distance. Slept well. No orthopnea.  Objective:   Temp:  [97.5 F (36.4 C)-97.8 F (36.6 C)] 97.8 F (36.6 C) (09/04 0612) Pulse Rate:  [89-92] 92 (09/04 0612) Resp:  [18-20] 18 (09/04 0612) BP: (98-106)/(64-78) 106/78 (09/04 0612) SpO2:  [94 %-100 %] 98 % (09/04 0740) FiO2 (%):  [21 %] 21 % (09/04 0740) Weight:  [213 lb 11.2 oz (96.9 kg)] 213 lb 11.2 oz (96.9 kg) (09/04 0612) Last BM Date: 01/02/16  Filed Weights   01/02/16 0300 01/03/16 0613 01/04/16 0612  Weight: 214 lb 14.4 oz (97.5 kg) 212 lb 9.6 oz (96.4 kg) 213 lb 11.2 oz (96.9 kg)    Intake/Output Summary (Last 24 hours) at 01/04/16 0821 Last data filed at 01/04/16 [id number]  Gross per 24 hour  Intake             1840 ml  Output             2900 ml  Net            -1060 ml    Telemetry: Sinus rhythm with PVCs and episode NSVT.  Exam:  General: Overweight woman, no distress.  HEENT: Oropharynx clear.  Lungs: No rales.  Cardiac: Indistinct PMI, RRR without gallop, elevated JVP.  Abdomen: Protuberant, nontender.  Extremities: Mild lower leg edema.  Lab Results:  Basic Metabolic Panel:  Recent Labs Lab 01/02/16 0148 01/03/16 0319 01/04/16 0206  NA 133* 133* 131*  K 4.0 4.3 4.6  CL 89* 90* 90*  CO2 31 34* 33*  GLUCOSE 191* 215* 240*  BUN 30* 37* 42*  CREATININE 1.31* 1.53* 1.51*  CALCIUM 10.2 9.7 9.2    Liver Function Tests:  Recent Labs Lab 12/28/15 1509 12/29/15 0950  AST 10 16  ALT 6 8*  ALKPHOS 99 91  BILITOT 0.5 0.6  PROT 6.6 6.6  ALBUMIN 3.6 3.0*    CBC:  Recent Labs Lab 12/30/15 0559 12/31/15 0144 01/01/16 0222  WBC 10.2 11.9* 10.5  HGB 10.7* 9.9* 10.4*  HCT 37.1 34.6* 36.2  MCV 75.3* 75.7* 75.9*  PLT 358 354 374    Cardiac Enzymes:  Recent Labs Lab 12/31/15 0144  12/31/15 0748 12/31/15 1249  TROPONINI 0.06* 0.06* 0.06*    Echocardiogram 12/30/2015: Study Conclusions  - Left ventricle: The cavity size was mildly dilated. Wall   thickness was increased in a pattern of moderate LVH. Systolic   function was severely reduced. The estimated ejection fraction   was in the range of 20% to 25%. Diffuse hypokinesis. There was   fusion of early and atrial contributions to ventricular filling.   Doppler parameters are consistent with a reversible restrictive   pattern, indicative of decreased left ventricular diastolic   compliance and/or increased left atrial pressure (grade 3   diastolic dysfunction). - Mitral valve: There was moderate regurgitation. - Left atrium: The atrium was moderately dilated. - Right ventricle: The cavity size was mildly dilated. Systolic   function was mildly reduced. - Right atrium: The atrium was moderately dilated. - Tricuspid valve: There was moderate regurgitation. - Pulmonary arteries: Systolic pressure was moderately increased.   PA peak pressure: 54 mm Hg (S). - Pericardium, extracardiac: There was a left pleural effusion.  Cardiac MRI 01/01/2016: IMPRESSION: 1. Moderately dilated left ventricle with severe diffuse hypokinesis, EF 15%.  2. Mildly dilated right ventricle with moderately decreased systolic function.  3.  Moderate TR and moderate MR.  4. Difficult images, but no definite LGE noted. Therefore, no definitive evidence for prior MI, myocarditis, or infiltrative disease.   Medications:   Scheduled Medications: . carvedilol  3.125 mg Oral BID WC  . docusate sodium  100 mg Oral Daily  . famotidine  40 mg Oral BID  . gabapentin  300 mg Oral TID  . insulin aspart  0-15 Units Subcutaneous TID WC  . insulin aspart  0-5 Units Subcutaneous QHS  . ipratropium  2.5 mL Inhalation BID  . mometasone-formoterol  2 puff Inhalation BID  . nicotine  21 mg Transdermal Daily  . pravastatin  40 mg Oral q1800   . Rivaroxaban  15 mg Oral BID WC  . [START ON 01/21/2016] rivaroxaban  20 mg Oral Daily  . sacubitril-valsartan  1 tablet Oral BID  . sodium chloride flush  3 mL Intravenous Q12H     PRN Medications: cyclobenzaprine, diazepam, HYDROcodone-acetaminophen, traMADol   Assessment:   1. Dilated cardiomyopathy with LVEF 15%, nonischemic. No clear evidence of prior infarct, infiltrative disorder, or myocarditis by cardiac MRI.   2. Acute on chronic combined heart failure, restrictive diastolic filling pattern also noted by echocardiogram. She has diuresed around 10 L in the last 48 hours, now off Lasix.  3. CKD stage 3, creatinine stable at 1.5. Bumped from 1.3.  4. Submassive pulmonary embolus with RV strain. She is on Xarelto, no obvious trigger. She has not had a hypercoagulability workup.  5. Moderate mitral and tricuspid regurgitation. PASP was 54 mmHg by echocardiography.  6. Hyperlipidemia, currently on Pravachol.  Plan/Discussion:    Continue Coreg and Entresto. Add Aldactone 12.5 mg daily. No other diuretic at this time. Ambulate hall, may be ready for D/C later today or tomorrow depending on how she does. She will need close follow-up of renal function and potassium, should be seen in the [facility] within 7 days with BMET.   Jonelle Sidle, M.D., F.A.C.C.

## 2016-01-04 NOTE — Care Management Note (Addendum)
Case Management Note  Patient Details  Name: Kaitlin Cantrell MRN: [id number] Date of Birth: May 07, 1955  Subjective/Objective:                    Action/Plan: CM consulted for [facility] co pay card. CM is unable to do a benefits check on her Delene Loll or Xarelto due to the holiday. CM spoke with patients preferred pharmacy ([facility] on [address]) and they do have the medication in stock. They were not able to provide information on the cost of the [facility] after the first month. CM met with Kaitlin Cantrell and provided her the 30 day free cards for the [facility] and Xarelto. CM will have her speak with her pharmacy, when she picks up her meds, on the cost of Entresto and Xarelto and use the assistance lines for both meds if they are costly. CM will also encourage her to inform her MD before the 30 day period is up if the medications are not affordable. Bedside RN updated.   Expected Discharge Date:  01/03/16               Expected Discharge Plan:  Home/Self Care  In-House Referral:     Discharge planning Services  CM Consult  Post Acute Care Choice:    Choice offered to:     DME Arranged:    DME Agency:     HH Arranged:    HH Agency:     Status of Service:  Completed, signed off  If discussed at [employer] of Stay Meetings, dates discussed:    Additional Comments:  Pollie Friar, RN 01/04/2016, 11:51 AM

## 2016-01-05 ENCOUNTER — Inpatient Hospital Stay (HOSPITAL_COMMUNITY): Payer: PPO

## 2016-01-05 DIAGNOSIS — I2609 Other pulmonary embolism with acute cor pulmonale: Secondary | ICD-10-CM | POA: Diagnosis not present

## 2016-01-05 DIAGNOSIS — I5041 Acute combined systolic (congestive) and diastolic (congestive) heart failure: Secondary | ICD-10-CM | POA: Diagnosis not present

## 2016-01-05 LAB — BASIC METABOLIC PANEL
Anion gap: 7 (ref 5–15)
BUN: 31 mg/dL — AB (ref 6–20)
CALCIUM: 9.5 mg/dL (ref 8.9–10.3)
CO2: 32 mmol/L (ref 22–32)
CREATININE: 1.22 mg/dL — AB (ref 0.44–1.00)
Chloride: 98 mmol/L — ABNORMAL LOW (ref 101–111)
GFR calc Af Amer: 55 mL/min — ABNORMAL LOW (ref >60)
GFR, EST NON AFRICAN AMERICAN: 48 mL/min — AB (ref >60)
Glucose, Bld: 171 mg/dL — ABNORMAL HIGH (ref 65–99)
POTASSIUM: 4.4 mmol/L (ref 3.5–5.1)
SODIUM: 137 mmol/L (ref 135–145)

## 2016-01-05 LAB — GLUCOSE, CAPILLARY
GLUCOSE-CAPILLARY: 238 mg/dL — AB (ref 65–99)
GLUCOSE-CAPILLARY: 193 mg/dL — AB (ref 65–99)

## 2016-01-05 MED ORDER — SPIRONOLACTONE 25 MG PO TABS: 12.5000 mg | ORAL_TABLET | Freq: Every day | ORAL | 1 refills | Status: DC

## 2016-01-05 MED ORDER — FUROSEMIDE 40 MG PO TABS
40.0000 mg | ORAL_TABLET | Freq: Every day | ORAL | Status: DC
Start: 2016-01-05 — End: 2016-01-05
  Administered 2016-01-05: 40 mg via ORAL
  Filled 2016-01-05: qty 1

## 2016-01-05 MED ORDER — FUROSEMIDE 40 MG PO TABS: 40.0000 mg | ORAL_TABLET | Freq: Every day | ORAL | 3 refills | Status: DC

## 2016-01-05 MED ORDER — SPIRONOLACTONE 25 MG PO TABS
12.5000 mg | ORAL_TABLET | Freq: Every day | ORAL | Status: DC
  Administered 2016-01-05: 12.5 mg via ORAL
  Filled 2016-01-05: qty 1

## 2016-01-05 MED ORDER — TERBINAFINE HCL 1 % EX CREA: TOPICAL_CREAM | Freq: Two times a day (BID) | CUTANEOUS | 0 refills | Status: DC

## 2016-01-05 NOTE — Progress Notes (Signed)
Heart Failure Navigator Consult Note  Presentation: Kaitlin Cantrell is a 60 y.o.female with history of COPD, tobacco abuse, DM, HTN, Arthritis, HDL and chronic pain syndrome who presented to [facility] with SOB. Pertinent labs on admission include K 4.7, Creatinine 1.05, BUN, 930.2, Troponin up to 0.06. WBC 10.8 recently treated as outpatient for CAP  States she has felt increasingly fatigued with more DOE over the past several months ( since the end of June). Was at her USOH prior to this. Has been a very stressful year with death of Mother, Pet of 16 years, and husband receiving diagnosis and treatment for Colon Cancer. She denies overt chest pain or history of any. No personal history of known heart disease. Father and brother both have had HF and CKD. No MIs. DOE walking from bed to doorway. + Orthopnea, unsure about bendopnea. Occasional lightheadedness with rapid standing but not marked or limiting. No near syncope.  Smokes 1 ppd (x 42 years). No ETOH or drug use. No history of cancer or recent surgery, no long car/plane trips.   Past Medical History:  Diagnosis Date  . Allergy    seasonal  . Arthritis   . Diabetes mellitus   . GERD (gastroesophageal reflux disease)   . History of pneumonia    15 years ago  . Hyperlipidemia   . Hypertension   . PE (pulmonary embolism) 12/2015    Social History   Social History  . Marital status: Married    Spouse name: N/A  . Number of children: 0  . Years of education: N/A   Occupational History  . retired    Social History Main Topics  . Smoking status: Current Every Day Smoker    Packs/day: 1.00    Years: 40.00    Types: Cigarettes  . Smokeless tobacco: Never Used  . Alcohol use No  . Drug use: No  . Sexual activity: Not Asked   Other Topics Concern  . None   Social History Narrative   Exercise walking daily (varies)    ECHO:Study Conclusions--12/30/15  - Left ventricle: The cavity size was mildly dilated. Wall  thickness was increased in a pattern of moderate LVH. Systolic   function was severely reduced. The estimated ejection fraction   was in the range of 20% to 25%. Diffuse hypokinesis. There was   fusion of early and atrial contributions to ventricular filling.   Doppler parameters are consistent with a reversible restrictive   pattern, indicative of decreased left ventricular diastolic   compliance and/or increased left atrial pressure (grade 3   diastolic dysfunction). - Mitral valve: There was moderate regurgitation. - Left atrium: The atrium was moderately dilated. - Right ventricle: The cavity size was mildly dilated. Systolic   function was mildly reduced. - Right atrium: The atrium was moderately dilated. - Tricuspid valve: There was moderate regurgitation. - Pulmonary arteries: Systolic pressure was moderately increased.   PA peak pressure: 54 mm Hg (S). - Pericardium, extracardiac: There was a left pleural effusion.  ------------------------------------------------------------------- Study data:   Study status:  Routine.  Procedure:  Transthoracic echocardiography. Image quality was adequate. The study was technically difficult, as a result of body habitus. Intravenous contrast (Definity) was administered.  Study completion:  There were no complications.          Transthoracic echocardiography. M-mode, complete 2D, spectral Doppler, and color Doppler. Birthdate:  Patient birthdate: [birth year].  Age:  Patient is 60 yr old.  Sex:  Gender: female.    BMI: 36.1  kg/m^2.  Blood pressure:   123/80  Patient status:  Inpatient.  Study date:  Study date: 12/30/2015. Study time: 02:29 PM.  Location:  Bedside  BNP    Component Value Date/Time   BNP 930.2 (H) 12/29/2015 0950   BNP 861.1 (H) 12/28/2015 1509    ProBNP No results found for: PROBNP   Education Assessment and Provision:  Detailed education and instructions provided on heart failure disease management including the  following:  Signs and symptoms of Heart Failure When to call the physician Importance of daily weights Low sodium diet Fluid restriction Medication management Anticipated future follow-up appointments  Patient education given on each of the above topics.  Patient acknowledges understanding and acceptance of all instructions.  I spoke with Ms. Bills regarding her new HF diagnosis and current hospitalization.  She tells me that she is still "trying to understand it all".  She does not have a scale at home.  I have reviewed the importance of daily weights and how weight increases relate to the signs and symptoms of HF.  She plans to buy a scale at discharge.  We discussed a low sodium diet and high sodium foods to avoid.  She admits that she adds table salt to most foods and will have to "get used to no salt".  I emphasized that most foods contain sodium and her target is 2000 mg or less per day.  She also admits to smoking --we discussed the impact of that habit on her heart and other conditions.  She acknowledges that she will need to stop smoking--however her husband smokes inside the house as well.  She denies any perceived issues with getting or taking prescribed medications.  She will follow with the [facility] Clinic after discharge and I have given her appt as well as directions to get to clinic.    Education Materials:  "Living Better With Heart Failure" Booklet, Daily Weight Tracker Tool    High Risk Criteria for Readmission and/or Poor Patient Outcomes:  (Recommend Follow-up with Advanced Heart Failure Clinic)--yes    EF <30%- yes 20-25%  2 or more admissions in 6 months- No  Difficult social situation- No  Demonstrates medication noncompliance- No denies  Barriers of Care:  New HF-- Knowledge and compliance  Discharge Planning:   [place]

## 2016-01-05 NOTE — Progress Notes (Signed)
VASCULAR LAB PRELIMINARY  ARTERIAL  ABI completed: within normal limits.    RIGHT    LEFT    PRESSURE WAVEFORM  PRESSURE WAVEFORM  BRACHIAL 128 Tri BRACHIAL 123 Tri  AT 114 Tri AT 130 Tri  PT 129 Tri PT 127 Tri    RIGHT LEFT  ABI 1.01 1.02    Farrel Demark, RDMS, RVT   01/05/2016, 1:41 PM

## 2016-01-05 NOTE — Progress Notes (Signed)
Pt has orders for discharge. IV and telemetry removed; IV site WNL. Discharge instructions given, pt verbalized understanding. Medication regimen reviewed, pt verbalized understanding. No further questions or concerns at this time. Pt awaiting transportation.  Quita Skye, RN

## 2016-01-05 NOTE — Care Management Important Message (Signed)
Important Message  Patient Details  Name: Kaitlin Cantrell MRN: [id number] Date of Birth: 01/29/1956   Medicare Important Message Given:  Yes    Tamon Parkerson 01/05/2016, 10:05 AM

## 2016-01-05 NOTE — Care Management Note (Signed)
Case Management Note  Patient Details  Name: Kaitlin Cantrell MRN: [id number] Date of Birth: December 05, 1955  Subjective/Objective:        CHF, PE            Action/Plan: Discharge Planning: AVS reviewed:  Please see previous NCM notes.  Contacted [facility] and they do have Entresto and Xarelto in stock. Entresto needs prior auth. Pharmacist has sent request to physician to complete. Pt has 30 day free trial cards for Entresto and Xarelto. Xarelto is $31 per month. And Sherryll Burger is $419 out of pocket without prior auth. NCM explained Entresto needing prior auth before insurance will cover. Pt lives at home with husband. Pt is independent prior to hospital stay.   Vernie Ammons MD  Expected Discharge Date:  01/05/2016               Expected Discharge Plan:  Home/Self Care  In-House Referral:  NA  Discharge planning Services  CM Consult, Medication Assistance  Post Acute Care Choice:  NA Choice offered to:  NA  DME Arranged:  N/A DME Agency:  NA  HH Arranged:  NA HH Agency:  NA  Status of Service:  Completed, signed off  If discussed at Long Length of Stay Meetings, dates discussed:    Additional Comments:  Elliot Cousin, RN 01/05/2016, 2:49 PM

## 2016-01-05 NOTE — Progress Notes (Signed)
Family Medicine Teaching Service Daily Progress Note Intern Pager: [phone]  Patient name: Kaitlin Cantrell Medical record number: [id number] Date of birth: 10/26/1955 Age: 60 y.o. Gender: female  Primary Care Provider: Norberto SorensonSHAW,EVA, MD Consultants: cardiology Code Status: FULL  Pt Overview and Major Events to Date:  8/29: Admit for unprovoked submassive PE  8/30: Found to have EF 20-25% on ECHO   Assessment and Plan: Kaitlin GerlachDonna R McClintockis a 60 y.o.femalepresenting with shortness of breath found to have a pulmonary embolism. PMH is significant for COPD, tobacco use, DM, HTN, Arthritis, HLD, chronic pain syndrome.  #Shortness of breath: Newly unprovoked submassive PE on CT angio. Treating for Acute CHF exacerbation as below.  -Transitioned to oral anticoagulation 8/31. Xarelto 15 bid x21 days followed by 20 once daily to complete 6 month course.   #Acute HFrEF: Echo done due to heart strain. Showed moderate LVH. EF 20-25% with diffuse hypokinesis, G3DD. Patient with 4.1L UOP on 9/2, trace edema BLE today -cardiology consulted, appreciate recs >> Entresto 24/26, Coreg 3.125 mg BID, Aldactone 12.5 daily,  -per cards, add back oral lasix 40mg  daily -cardiology ordered spep to rule out amyloidosis as cause for heart failure  #AKI: Cr 1.05 on admission. Trend 1.11>1.30>1.31>1.53>1.51>>1.22. Likely in the setting of IV lasix use. Diuresis held 9/3. -creatinine trending down -Lasix po added back by cards, will continue as outpatient  #Lower extremity rash Peeling was present at time of admission, redness and pain is new as of 9/4. Initial concern for drug reaction but low suspicion, per pharmacy most likely causative agent is Xarelto. Likely fungal rash as it was present before beginning Xarelto, will treat as such and if no response will consider changing anticoagulants for possible drug reaction. Improved today with usage of Lamisil. -will continue Lamisil twice a day and give her this  cream at discharge to continue outpatient -ABI's ordered to assess pulses in foot  #COPD: Stable, no evidence of COPD exacerbation.Pt uses symbicort, atrovent, duleraat home.  -xopenex, atrovent, dulera  #HTN:Normotensive -see cards recs above, currently on coreg 3.125mg , entresto 24/26, aldactone 12.5 qd  #HLD-Last LDL 87 on 10/01/2015 -continue home lovastatin -repeat lipids per cards as an outpatient  #DM: On glimepiride, metformin at home. HgA1C 7.1 05/2015. 8 units used in last 12 hours. -SSI ACHS  #GERD: stable -pepcid  #Arthritis/Chronic pain syndrome: History of back surgery. Tramadol, Norco at home. On review of [facility] data base last received Norco 7.5-325 # 90 on 12/04/2015 and tramadol 50 mg # 240 on 8/4//2017 -Continued home norco, gabapentin, flexeril, tramadol  #Tobacco abuse: Pt smokes 0.5-1.0 packs per day. Has tried quitting in the past one time 20 years ago. Is interested in quitting if she can. Of note, her husband also smokes and this makes it harder for her to quit. -nicotine patch -smoking cessation counseling performed, suggested she discuss Chantix with Dr. Clelia CroftShaw  FEN/GI:heart healthy diet, home pepcid, SLIV Prophylaxis:lovenox treatment  Disposition: discharge pending cardiology testing done today  Subjective:  Feels well today- the burning pain in her feet was relieved with Lamisil cream. She has no further complaints today.   Objective: Temp:  [97.5 F (36.4 C)-98.4 F (36.9 C)] 98.2 F (36.8 C) (09/05 0630) Pulse Rate:  [93-99] 93 (09/05 0630) Resp:  [18] 18 (09/05 0630) BP: (103-122)/(69-83) 111/83 (09/05 0630) SpO2:  [90 %-100 %] 90 % (09/05 0850) Weight:  [214 lb 6.4 oz (97.3 kg)] 214 lb 6.4 oz (97.3 kg) (09/05 0630) Physical Exam: General: Pleasant elderly lady, in  NAD, sitting on edge of bed.  Cardiovascular: rrr, no m/r/g Respiratory: CTAB, good air movement Abdomen: SNTND Skin: warm, dry, peeling on feet bilaterally, no  peeling of hands or mucous membranes Extremities: +1 bilateral lower extremity edema, feet bilaterally with peeling and erythema, no increased warmth  Laboratory:  Recent Labs Lab 12/30/15 0559 12/31/15 0144 01/01/16 0222  WBC 10.2 11.9* 10.5  HGB 10.7* 9.9* 10.4*  HCT 37.1 34.6* 36.2  PLT 358 354 374    Recent Labs Lab 12/29/15 0950  01/03/16 0319 01/04/16 0206 01/05/16 0733  NA 136  < > 133* 131* 137  K 4.0  < > 4.3 4.6 4.4  CL 100*  < > 90* 90* 98*  CO2 25  < > 34* 33* 32  BUN 15  < > 37* 42* 31*  CREATININE 1.03*  < > 1.53* 1.51* 1.22*  CALCIUM 9.0  < > 9.7 9.2 9.5  PROT 6.6  --   --   --   --   BILITOT 0.6  --   --   --   --   ALKPHOS 91  --   --   --   --   ALT 8*  --   --   --   --   AST 16  --   --   --   --   GLUCOSE 207*  < > 215* 240* 171*  < > = values in this interval not displayed.   Tillman Sers, DO 01/05/2016, 9:07 AM PGY-1, [facility] FPTS Intern pager: [phone], text pages welcome

## 2016-01-05 NOTE — Progress Notes (Signed)
Patient ID: TORINA THRUSH, female   DOB: [birth year], 60 y.o.   MRN: [id number]   SERYNITY DOTTERWEICH is a 60 y.o. female with history of COPD, tobacco abuse, DM, HTN, Arthritis, HDL and chronic pain syndrome who presented to [facility] with SOB. Pertinent labs on admission include K 4.7, Creatinine 1.05, BUN, 930.2, Troponin up to 0.06.  WBC 10.8 recently treated as outpatient for CAP.  CT angio 12/29/15 submassive PE with concerns for right heart strain. Now on Xarelto.   CXR 830/17 with stable left pleural effusion and left basilar atelectasis.   Echo 12/30/15 shows LVEF 20-25%, Grade 3 DD, Mod MR, mod LAE, Mild RV dilation with mildly reduced systolic function, Moderate RAE, Moderate TR. Peak PA pressure 54 mm Hg.   Cardiac MRI (9/17) with EF 15%, moderate LV dilation, mild RV dilation/moderately decreased RV systolic function, no LGE.  States she has felt increasingly fatigued with more DOE over the past several months ( since the end of June). Was at her USOH prior to this.  Has been a very stressful year with death of Mother, Pet of 16 years, and husband receiving diagnosis and treatment for Colon Cancer.    She denies overt chest pain or history of any. No personal history of known heart disease.  Father and brother both have had HF and CKD. No MIs. DOE walking from bed to doorway. + Orthopnea, unsure about bendopnea.  Occasional lightheadedness with rapid standing but not marked or limiting. No near syncope.  Smokes 1 ppd (x 42 years). No ETOH or drug use.  No history of cancer or recent surgery, no long car/plane trips.   She has diuresed in hospital with mild bump in creatinine, Lasix held.  Creatinine better today, down to 1.22.  Breathing improved.  Has developed erythema on toes/soles of feet.    Vitals:   01/04/16 1211 01/04/16 2012 01/04/16 2056 01/05/16 0630  BP: 103/69  122/75 111/83  Pulse: 95  99 93  Resp: 18  18 18   Temp: 97.5 F (36.4 C)  98.4 F (36.9 C) 98.2 F (36.8 C)   TempSrc: Oral  Oral Oral  SpO2: 100% 99% 100% 93%  Weight:    214 lb 6.4 oz (97.3 kg)  Height:        Intake/Output Summary (Last 24 hours) at 01/05/16 0826 Last data filed at 01/05/16 0630  Gross per 24 hour  Intake              720 ml  Output             2600 ml  Net            -1880 ml    LABS: Basic Metabolic Panel:  Recent Labs  66/59/93 0206 01/05/16 0733  NA 131* 137  K 4.6 4.4  CL 90* 98*  CO2 33* 32  GLUCOSE 240* 171*  BUN 42* 31*  CREATININE 1.51* 1.22*  CALCIUM 9.2 9.5   Liver Function Tests: No results for input(s): AST, ALT, ALKPHOS, BILITOT, PROT, ALBUMIN in the last 72 hours. No results for input(s): LIPASE, AMYLASE in the last 72 hours. CBC: No results for input(s): WBC, NEUTROABS, HGB, HCT, MCV, PLT in the last 72 hours. Cardiac Enzymes: No results for input(s): CKTOTAL, CKMB, CKMBINDEX, TROPONINI in the last 72 hours. BNP: Invalid input(s): POCBNP D-Dimer: No results for input(s): DDIMER in the last 72 hours. Hemoglobin A1C: No results for input(s): HGBA1C in the last 72 hours. Fasting Lipid Panel:  No results for input(s): CHOL, HDL, LDLCALC, TRIG, CHOLHDL, LDLDIRECT in the last 72 hours. Thyroid Function Tests: No results for input(s): TSH, T4TOTAL, T3FREE, THYROIDAB in the last 72 hours.  Invalid input(s): FREET3 Anemia Panel: No results for input(s): VITAMINB12, FOLATE, FERRITIN, TIBC, IRON, RETICCTPCT in the last 72 hours.  RADIOLOGY: Dg Chest 2 View  Result Date: 12/30/2015 CLINICAL DATA:  Shortness of breath. History of pneumonia and recent pulmonary embolism. EXAM: CHEST  2 VIEW COMPARISON:  Radiographs 12/28/2015.  CT 12/29/2015. FINDINGS: The heart size and mediastinal contours are stable. There is mild cardiomegaly. A subpulmonic left pleural effusion and adjacent left basilar atelectasis have not significantly changed. The right lung is clear. There is no pneumothorax. The bones appear stable. IMPRESSION: Stable left pleural  effusion and left basilar atelectasis. No new findings. Electronically Signed   By: Carey Bullocks M.D.   On: 12/30/2015 11:13   Dg Chest 2 View  Result Date: 12/28/2015 CLINICAL DATA:  Increasing shortness of breath over the past 3 months EXAM: CHEST  2 VIEW COMPARISON:  PA and lateral chest x-ray of November 16, 2015 FINDINGS: There is a small to moderate size left pleural effusion which has increased significantly since the previous study. There is no right pleural effusion. No discrete infiltrate is observed though certainly left basilar atelectasis or infiltrate may be present. The cardiac silhouette is enlarged. The pulmonary vascularity is not engorged. The mediastinum is normal in width. The bony thorax exhibits no acute abnormality. IMPRESSION: Increased size of the left pleural effusion small to moderate in volume. Cardiomegaly without pulmonary edema. Electronically Signed   By: David  [place] M.D.   On: 12/28/2015 15:15   Ct Angio Chest Pe W And/or Wo Contrast  Result Date: 12/29/2015 CLINICAL DATA:  Increasing shortness of Breath. Question pulmonary embolus. EXAM: CT ANGIOGRAPHY CHEST WITH CONTRAST TECHNIQUE: Multidetector CT imaging of the chest was performed using the standard protocol during bolus administration of intravenous contrast. Multiplanar CT image reconstructions and MIPs were obtained to evaluate the vascular anatomy. CONTRAST:  Contrast 100 cc Isovue 370 COMPARISON:  None. FINDINGS: Cardiovascular: Heart is enlarged. No pericardial effusion. No thoracic aortic aneurysm. Acute pulmonary embolus is identified in segmental and subsegmental pulmonary arteries to the right upper lobe, right middle lobe, and both lower lobes. RV/LV ratios difficult to assess given the lack of contrast on left heart, but the ratio is probably approaching 1.0. Mediastinum/Nodes: 10 mm short axis prevascular lymph node is upper normal to borderline increased. No other mediastinal lymphadenopathy is evident.  There is no hilar lymphadenopathy. There is no axillary lymphadenopathy. Lungs/Pleura: Assessment of lung parenchyma degraded by patient breathing motion during image acquisition. Small wedge-shaped peripheral defect posterior right lower lobe may be related atelectasis or infarct. There is left base collapse/ consolidation with small to moderate left pleural effusion. Upper Abdomen: Unremarkable. Musculoskeletal: Convex rightward lumbar scoliosis noted. Bone windows reveal no worrisome lytic or sclerotic osseous lesions. Review of the MIP images confirms the above findings. IMPRESSION: 1. Positive for acute PE with CT evidence of right heart strain (RV/LV Ratio = 1.0) consistent with at least submassive (intermediate risk) PE. The presence of right heart strain has been associated with an increased risk of morbidity and mortality. Please activate Code PE by paging [phone]. Critical Value/emergent results were called by telephone at the time of interpretation on 12/29/2015 at 12:06 pm to Dr. Jacalyn Lefevre , who verbally acknowledged these results. Electronically Signed   By: Jamison Oka.D.  On: 12/29/2015 12:06   Mr Card Morphology Wo/w Cm  Result Date: 01/01/2016 CLINICAL DATA:  Cardiomyopathy of uncertain etiology. EXAM: CARDIAC MRI TECHNIQUE: The patient was scanned on a 1.5 Tesla GE magnet. A dedicated cardiac coil was used. Functional imaging was done using Fiesta sequences. 2,3, and 4 chamber views were done to assess for RWMA's. Modified Simpson's rule using a short axis stack was used to calculate an ejection fraction on a dedicated work Research officer, trade unionstation using Circle software. The patient received 29 cc of Multihance. After 10 minutes inversion recovery sequences were used to assess for infiltration and scar tissue. CONTRAST:  29 cc Multihance FINDINGS: Small left pleural effusion.  Trivial pericardial effusion. Moderately dilated left ventricle with severe diffuse hypokinesis. EF 15%. Normal wall  thickness. The right ventricle was mildly dilated with moderately decreased systolic function. There was moderate left atrial enlargement and mild right atrial enlargement. There was moderate tricuspid regurgitation. There was at least moderate central mitral regurgitation. Flow sequences to quantify were not done. Delayed enhancement images were difficult due to respiratory artifact. However, there was no definite late gadolinium enhancement (LGE) visualized. MEASUREMENTS: MEASUREMENTS LV EDV 297 mL LV SV 44 mL LV EF 15% IMPRESSION: 1. Moderately dilated left ventricle with severe diffuse hypokinesis, EF 15%. 2. Mildly dilated right ventricle with moderately decreased systolic function. 3.  Moderate TR and moderate MR. 4. Difficult images, but no definite LGE noted. Therefore, no definitive evidence for prior MI, myocarditis, or infiltrative disease. Rameen Gohlke Electronically Signed   By: Marca Anconaalton  Lawson Mahone M.D.   On: 01/01/2016 17:27    PHYSICAL EXAM General: NAD Neck: JVP 8 cm, no thyromegaly or thyroid nodule.  Lungs: Clear to auscultation bilaterally with normal respiratory effort. CV: Nondisplaced PMI.  Heart regular S1/S2, no S3/S4, no murmur.  No peripheral edema.  No carotid bruit.  Unable to palpate pulses left foot. Abdomen: Soft, nontender, no hepatosplenomegaly, no distention.  Neurologic: Alert and oriented x 3.  Psych: Normal affect. Extremities: No clubbing or cyanosis.  Skin: Erythema of toes/soles of feet bilaterally.   TELEMETRY: Reviewed telemetry pt in NSR  ASSESSMENT AND PLAN: Ed BlalockDonna R Demetro is a 60 y.o. female with history of COPD, tobacco abuse, DM, HTN, Arthritis, HDL and chronic pain syndrome [facility] CT showed submassive PE. Started on Xarelto.   HF team consulted with new CHF - Systolic EF 20-25% with grade 3 DD. 1. PE: Submassive on CT.  Etiology uncertain: no known cancer, long trip, recent surgery.  She is fairly active.  Possibly related to  stasis in the setting of cardiomyopathy with severely decreased systolic function.  - Given cardiomyopathy and submassive PE with no certain trigger, think she will need long-term anticoagulation.  She is on Xarelto.  2. Acute on chronic systolic CHF: I reviewed echo, EF appears 15-20% with diffuse hypokinesis.  Uncertain etiology.  No chest pain.  Symptoms began as orthopnea 2 months ago, then developed progressive exertional dyspnea.  ECG shows anteroseptal MI but no change from 2013 ECG.  Cardiac MRI did not show LGE.  HIV negative, thyroid indices ok (mild TSH elevation but normal T3/T4).  She has been diuresed, volume status looks better now.  - Ultimately, needs right and left heart cath for CAD evaluation.  However, given absence of chest pain/ACS and acute admission with submassive PE, would avoid holding anticoagulation for cath for a couple of months.  No evidence for prior MI on cardiac MRI.  - Creatinine better, can  restart Lasix at 40 mg po daily.  - Continue Entresto, Coreg, and spironolactone at current doses.  - SPEP.  3. CKD: Stage III.  Follow creatinine closely with diuresis => better today, can resume po Lasix.   4. Active smoker: Counseled to quit.  5. Hyperlipidemia: On lovastatin at home, can use pravastatin here.  Check lipids in am given concern for possible CAD.  6. PAD: Difficult to palpate pulses left foot, will check arterial dopplers.  7. Rash: Symmetrical involving toes/soles of feet => ? Drug eruption versus fungal.  Doubt PAD (good pulses to right foot).  Per primary service.   Marca Ancona 01/05/2016 8:34 AM

## 2016-01-06 LAB — PROTEIN ELECTROPHORESIS, SERUM
A/G RATIO SPE: 1 (ref 0.7–1.7)
ALBUMIN ELP: 3 g/dL (ref 2.9–4.4)
ALPHA-1-GLOBULIN: 0.2 g/dL (ref 0.0–0.4)
Alpha-2-Globulin: 0.8 g/dL (ref 0.4–1.0)
Beta Globulin: 1.3 g/dL (ref 0.7–1.3)
GLOBULIN, TOTAL: 3 g/dL (ref 2.2–3.9)
Gamma Globulin: 0.6 g/dL (ref 0.4–1.8)
TOTAL PROTEIN ELP: 6 g/dL (ref 6.0–8.5)

## 2016-01-14 ENCOUNTER — Ambulatory Visit (INDEPENDENT_AMBULATORY_CARE_PROVIDER_SITE_OTHER): Payer: PPO | Admitting: Family Medicine

## 2016-01-14 ENCOUNTER — Encounter: Payer: Self-pay | Admitting: Family Medicine

## 2016-01-14 VITALS — BP 110/70 | HR 96 | Temp 97.5°F | Resp 18 | Ht 66.0 in | Wt 202.4 lb

## 2016-01-14 DIAGNOSIS — R61 Generalized hyperhidrosis: Secondary | ICD-10-CM | POA: Diagnosis not present

## 2016-01-14 DIAGNOSIS — Z79899 Other long term (current) drug therapy: Secondary | ICD-10-CM | POA: Diagnosis not present

## 2016-01-14 DIAGNOSIS — J449 Chronic obstructive pulmonary disease, unspecified: Secondary | ICD-10-CM

## 2016-01-14 DIAGNOSIS — I5041 Acute combined systolic (congestive) and diastolic (congestive) heart failure: Secondary | ICD-10-CM | POA: Diagnosis not present

## 2016-01-14 DIAGNOSIS — I429 Cardiomyopathy, unspecified: Secondary | ICD-10-CM | POA: Diagnosis not present

## 2016-01-14 DIAGNOSIS — I2609 Other pulmonary embolism with acute cor pulmonale: Secondary | ICD-10-CM

## 2016-01-14 DIAGNOSIS — G894 Chronic pain syndrome: Secondary | ICD-10-CM

## 2016-01-14 DIAGNOSIS — E119 Type 2 diabetes mellitus without complications: Secondary | ICD-10-CM

## 2016-01-14 DIAGNOSIS — Z720 Tobacco use: Secondary | ICD-10-CM | POA: Diagnosis not present

## 2016-01-14 LAB — POCT CBC
Granulocyte percent: 71.6 %G (ref 37–80)
HEMATOCRIT: 39.9 % (ref 37.7–47.9)
Hemoglobin: 13.2 g/dL (ref 12.2–16.2)
LYMPH, POC: 2.8 (ref 0.6–3.4)
MCH, POC: 23.3 pg — AB (ref 27–31.2)
MCHC: 33.1 g/dL (ref 31.8–35.4)
MCV: 70.5 fL — AB (ref 80–97)
MID (CBC): 0.3 (ref 0–0.9)
MPV: 7.7 fL (ref 0–99.8)
POC GRANULOCYTE: 7.7 — AB (ref 2–6.9)
POC LYMPH PERCENT: 25.7 %L (ref 10–50)
POC MID %: 2.7 % (ref 0–12)
Platelet Count, POC: 331 10*3/uL (ref 142–424)
RBC: 5.67 M/uL — AB (ref 4.04–5.48)
RDW, POC: 18.9 %
WBC: 10.8 10*3/uL — AB (ref 4.6–10.2)

## 2016-01-14 MED ORDER — VARENICLINE TARTRATE 0.5 MG X 11 & 1 MG X 42 PO MISC: ORAL | 0 refills | Status: DC

## 2016-01-14 NOTE — Progress Notes (Signed)
Subjective:    Patient ID: Kaitlin Cantrell, female    DOB: 1956/04/04, 60 y.o.   MRN: [id number] Chief Complaint  Patient presents with  . Follow-up    hospital follow up     HPI  Kaitlin Cantrell is a delightful 60 yo who is here to follow-up a wk long hospitalization from 8/29-9/5 for a submassive PE with right heart strain.  PE with right heart strain: On Xarelto long-term, likely permanently. Echo 12/30/15 shows LVEF 20-25%, Grade 3 DD, Mod MR, mod LAE, Mild RV dilation with mildly reduced systolic function, Moderate RAE, Moderate TR. Peak PA pressure 54 mm Hg. Cardiac MRI (9/17) with EF 15%, moderate LV dilation, mild RV dilation/moderately decreased RV systolic function, no LGE.  Unknown etiology of PE as no known risk factors or inciting events.  Possibly related to stasis in the setting of cardiomyopathy with severely decreased systolic function.   New acute CHF: Systolic EF 10-07% with grade 3 DD on echo 12/30/15 and Cardiac MRI 09/17 - see above. Uncertain etiology. Will need right and left heart cath for eval of CAD but cards advised waiting sev mos for this as anticoagulation will have to be held and not pressing since there is no evidence for prior MI on cardiac MRI.  On lasix 80m qd in addition to Entresto, Coreg, and spironolactone.   Being eval for poss CAD with lower ext arterial dopplers as well   COPD with ongoing tobacco abuse: Smokes 1 ppd (x 42 years). Tried to quit smoking once and she got discombobulated and miserable it only lasted for about 8 hrs. She is doing really   DM:  HTN:  Chronic pain syndome with arthritis:  HPL: ON lovastatin. LDL Goal <70 - now slihtly above goal at least check 3 mos prior LDL 87, non HDL 111  CKD stage III:   Has been a very stressful year with death of Mother, Pet of 145years, and husband receiving diagnosis and treatment for Colon Cancer.   On the day of her discharge she was noted to have a symmetric red rash on her toes and soles of  feet concerning for drug rash vs fungal.  Improving - thinks it was from the hsop floors  Has had a difficult time with her appetite and very fatigued.  She getting tired with minimal activity -sitting up, washing dishes.   She is having some episodes of PND when falling asleep but never happens during sleep.  Has cardiology appt tomorrow  Having sudden sweats - ever since her pna - esr 38 2 wks ago prior to hosp - night sweats have been soaked an dintermittently for years. Has an appointment for [facility] in 3 wks - will need to review cancer screening.   [place] home 226  [facility] [place] [facility] [place] [facility] [facility] [place] [facility] -   Past Medical History:  Diagnosis Date  . Allergy    seasonal  . Arthritis   . Diabetes mellitus   . GERD (gastroesophageal reflux disease)   . History of pneumonia    15 years ago  . Hyperlipidemia   . Hypertension   . PE (pulmonary embolism) 12/2015   Past Surgical History:  Procedure Laterality Date  . ANKLE FRACTURE SURGERY  1974   left and pinned then pins removed  . ANKLE GANGLION CYST EXCISION  1994   right  . BACK SURGERY  1998   discectomy  and then fusion  . KNEE ARTHROPLASTY  02/14/2012   Procedure: COMPUTER ASSISTED TOTAL KNEE ARTHROPLASTY;  Surgeon: Meredith Pel, MD;  Location: [facility] [facility];  Service: Orthopedics;  Laterality: Left;  Left total knee arthroplasty  . TONSILLECTOMY  1972  . TUBAL LIGATION  1996  . UMBILICAL HERNIA REPAIR  1996   Current Outpatient Prescriptions on File Prior to Visit  Medication Sig Dispense Refill  . azelastine (OPTIVAR) 0.05 % ophthalmic solution Place 1 drop into both eyes 2 (two) times daily. 6 mL 12  . budesonide-formoterol (SYMBICORT) 160-4.5 MCG/ACT inhaler Inhale 1 puff into the lungs 2 (two) times daily. 1 Inhaler 12  . carvedilol (COREG) 3.125 MG tablet Take 1 tablet (3.125 mg total) by mouth 2 (two) times daily with a meal. 60 tablet 1  . cyclobenzaprine  (FLEXERIL) 10 MG tablet TAKE ONE TABLET BY MOUTH THREE TIMES DAILY AS NEEDED FOR MUSCLE SPASMS 270 tablet 1  . diazepam (VALIUM) 5 MG tablet TAKE ONE TABLET BY MOUTH AT BEDTIME AND  MAY  REPEAT  DOSE  ONE  TIME  IF  NEEDED 180 tablet 1  . furosemide (LASIX) 40 MG tablet Take 1 tablet (40 mg total) by mouth daily. 30 tablet 1  . gabapentin (NEURONTIN) 300 MG capsule Take 1 capsule (300 mg total) by mouth 3 (three) times daily. Titrate up slowely 90 capsule 3  . glimepiride (AMARYL) 2 MG tablet TAKE ONE TABLET BY MOUTH TWICE DAILY 180 tablet 0  . HYDROcodone-acetaminophen (NORCO) 7.5-325 MG tablet Take 1 tablet by mouth every 6 (six) hours as needed for pain.    Marland Kitchen ipratropium (ATROVENT HFA) 17 MCG/ACT inhaler Inhale 2 puffs into the lungs 4 (four) times daily. 1 Inhaler 12  . Krill Oil CAPS Take 1 capsule by mouth daily.     Marland Kitchen lovastatin (MEVACOR) 20 MG tablet TAKE 2 TABLETS BY MOUTH DAILY AT 6PM 180 tablet 0  . metFORMIN (GLUCOPHAGE) 1000 MG tablet TAKE ONE TABLET BY MOUTH TWICE DAILY WITH  MEALS. 180 tablet 0  . omeprazole (PRILOSEC) 20 MG capsule Take 1 capsule (20 mg total) by mouth daily. 1 capsule 0  . rivaroxaban (XARELTO) 20 MG TABS tablet Take 1 tablet (20 mg total) by mouth daily. (Patient not taking: Reported on 01/15/2016) 30 tablet 1  . sacubitril-valsartan (ENTRESTO) 24-26 MG Take 1 tablet by mouth 2 (two) times daily. 60 tablet 1  . traMADol (ULTRAM) 50 MG tablet Take 50 mg by mouth every 6 (six) hours as needed for pain.    . clotrimazole-betamethasone (LOTRISONE) cream Apply 1 application topically 2 (two) times daily. 45 g 2  . ranitidine (ZANTAC) 150 MG tablet TAKE ONE TABLET BY MOUTH TWICE DAILY. 180 tablet 0  . Rivaroxaban (XARELTO) 15 MG TABS tablet Take 1 tablet (15 mg total) by mouth 2 (two) times daily with a meal. 42 tablet 0   No current facility-administered medications on file prior to visit.    Allergies  Allergen Reactions  . Albuterol     Throat/lips swelling  .  Morphine And Related Other (See Comments)    Very aggressive and doesn't help pain   Family History  Problem Relation Age of Onset  . Colon polyps Father   . Diabetes Father     mother  . Heart disease Father   . Heart murmur Mother   . Diabetes Mother   . Hypertension Mother   . Diabetes Brother   . Cancer Maternal Grandmother   . Diabetes Paternal  Grandmother   . Heart disease Paternal Grandmother   . COPD Paternal Grandfather   . Heart disease Paternal Grandfather   . Hypertension Brother   . Hypertension Brother    Social History   Social History  . Marital status: Married    Spouse name: N/A  . Number of children: 0  . Years of education: N/A   Occupational History  . retired    Social History Main Topics  . Smoking status: Current Every Day Smoker    Packs/day: 1.00    Years: 40.00    Types: Cigarettes  . Smokeless tobacco: Never Used  . Alcohol use No  . Drug use: No  . Sexual activity: Not Asked   Other Topics Concern  . None   Social History Narrative   Exercise walking daily (varies)     Review of Systems  Constitutional: Positive for activity change, appetite change, chills, diaphoresis, fatigue and unexpected weight change.  Eyes: Negative for visual disturbance.  Respiratory: Positive for cough, chest tightness and shortness of breath.   Cardiovascular: Positive for leg swelling.  Genitourinary: Negative for decreased urine volume.  Musculoskeletal: Positive for arthralgias, back pain and myalgias.  Skin: Negative for color change and rash.  Neurological: Positive for weakness. Negative for syncope and headaches.  Psychiatric/Behavioral: Positive for dysphoric mood and sleep disturbance. Negative for behavioral problems and confusion.       Objective:   Physical Exam  Constitutional: She is oriented to person, place, and time. She appears well-developed and well-nourished. No distress.  HENT:  Head: Normocephalic and atraumatic.  Right  Ear: External ear normal.  Left Ear: External ear normal.  Eyes: Conjunctivae are normal. No scleral icterus.  Neck: Normal range of motion. Neck supple. No thyromegaly present.  Cardiovascular: Normal rate, regular rhythm, normal heart sounds and intact distal pulses.   Pulmonary/Chest: Effort normal and breath sounds normal. No respiratory distress.  Musculoskeletal: She exhibits no edema.  Lymphadenopathy:    She has no cervical adenopathy.  Neurological: She is alert and oriented to person, place, and time.  Skin: Skin is warm and dry. She is not diaphoretic. No erythema.  Psychiatric: She has a normal mood and affect. Her behavior is normal.      BP 110/70   Pulse 96   Temp 97.5 F (36.4 C) (Oral)   Resp 18   Ht '5\' 6"'  (1.676 m)   Wt 202 lb 6.4 oz (91.8 kg)   SpO2 99%   BMI 32.67 kg/m      Assessment & Plan:   Look at spep results - normal, arterial doppler results - nml ABI  Not fasting - need flp at f/u in Oct Free t3 an t4 were normal while tsh was 4.5 1. Night sweat   2. Tobacco abuse   3. Acute combined systolic and diastolic congestive heart failure (HCC)   4. Cardiomyopathy, unspecified type ([place])   5. Other acute pulmonary embolism with acute cor pulmonale ([place])   6. Chronic obstructive pulmonary disease, unspecified COPD type ([place])   7. Type 2 diabetes mellitus without complication, without long-term current use of insulin ([place])   8. Polypharmacy   9. Chronic pain syndrome      Orders Placed This Encounter  Procedures  . C-reactive protein  . POCT CBC  . POCT SEDIMENTATION RATE    Meds ordered this encounter  Medications  . varenicline (CHANTIX STARTING MONTH PAK) 0.5 MG X 11 & 1 MG X 42 tablet  Sig: Take one 0.5 mg tablet by mouth once daily for 3 days, then increase to one 0.5 mg tablet twice daily for 4 days, then increase to one 1 mg tablet twice daily.    Dispense:  53 tablet    Refill:  0      Delman Cheadle, M.D.  [facility] [address] [place], [place] [address] [phone] phone [phone] fax  02/01/16 7:10 AM  Results for orders placed or performed in visit on 01/14/16  C-reactive protein  Result Value Ref Range   CRP 4.4 <8.0 mg/L  POCT CBC  Result Value Ref Range   WBC 10.8 (A) 4.6 - 10.2 K/uL   Lymph, poc 2.8 0.6 - 3.4   POC LYMPH PERCENT 25.7 10 - 50 %L   MID (cbc) 0.3 0 - 0.9   POC MID % 2.7 0 - 12 %M   POC Granulocyte 7.7 (A) 2 - 6.9   Granulocyte percent 71.6 37 - 80 %G   RBC 5.67 (A) 4.04 - 5.48 M/uL   Hemoglobin 13.2 12.2 - 16.2 g/dL   HCT, POC 39.9 37.7 - 47.9 %   MCV 70.5 (A) 80 - 97 fL   MCH, POC 23.3 (A) 27 - 31.2 pg   MCHC 33.1 31.8 - 35.4 g/dL   RDW, POC 18.9 %   Platelet Count, POC 331 142 - 424 K/uL   MPV 7.7 0 - 99.8 fL

## 2016-01-14 NOTE — Patient Instructions (Addendum)
   IF you received an x-ray today, you will receive an invoice from [facility]. Please contact [facility] at [phone] with questions or concerns regarding your invoice.   IF you received labwork today, you will receive an invoice from [facility]. Please contact [facility] at [phone] with questions or concerns regarding your invoice.   Our billing staff will not be able to assist you with questions regarding bills from these companies.  You will be contacted with the lab results as soon as they are available. The fastest way to get your results is to activate your My Chart account. Instructions are located on the last page of this paperwork. If you have not heard from us regarding the results in 2 weeks, please contact this office.      Low-Sodium Eating Plan Sodium raises blood pressure and causes water to be held in the body. Getting less sodium from food will help lower your blood pressure, reduce any swelling, and protect your heart, liver, and kidneys. We get sodium by adding salt (sodium chloride) to food. Most of our sodium comes from canned, boxed, and frozen foods. Restaurant foods, fast foods, and pizza are also very high in sodium. Even if you take medicine to lower your blood pressure or to reduce fluid in your body, getting less sodium from your food is important. WHAT IS MY PLAN? Most people should limit their sodium intake to 2,300 mg a day. Your health care provider recommends that you limit your sodium intake to __________ a day.  WHAT DO I NEED TO KNOW ABOUT THIS EATING PLAN? For the low-sodium eating plan, you will follow these general guidelines:  Choose foods with a % Daily Value for sodium of less than 5% (as listed on the food label).   Use salt-free seasonings or herbs instead of table salt or sea salt.   Check with your health care provider or pharmacist before using salt substitutes.   Eat fresh  foods.  Eat more vegetables and fruits.  Limit canned vegetables. If you do use them, rinse them well to decrease the sodium.   Limit cheese to 1 oz (28 g) per day.   Eat lower-sodium products, often labeled as "lower sodium" or "no salt added."  Avoid foods that contain monosodium glutamate (MSG). MSG is sometimes added to Chinese food and some canned foods.  Check food labels (Nutrition Facts labels) on foods to learn how much sodium is in one serving.  Eat more home-cooked food and less restaurant, buffet, and fast food.  When eating at a restaurant, ask that your food be prepared with less salt, or no salt if possible.  HOW DO I READ FOOD LABELS FOR SODIUM INFORMATION? The Nutrition Facts label lists the amount of sodium in one serving of the food. If you eat more than one serving, you must multiply the listed amount of sodium by the number of servings. Food labels may also identify foods as:  Sodium free--Less than 5 mg in a serving.  Very low sodium--35 mg or less in a serving.  Low sodium--140 mg or less in a serving.  Light in sodium--50% less sodium in a serving. For example, if a food that usually has 300 mg of sodium is changed to become light in sodium, it will have 150 mg of sodium.  Reduced sodium--25% less sodium in a serving. For example, if a food that usually has 400 mg of sodium is changed to reduced sodium, it will have   300 mg of sodium. WHAT FOODS CAN I EAT? Grains Low-sodium cereals, including oats, puffed wheat and rice, and shredded wheat cereals. Low-sodium crackers. Unsalted rice and pasta. Lower-sodium bread.  Vegetables Frozen or fresh vegetables. Low-sodium or reduced-sodium canned vegetables. Low-sodium or reduced-sodium tomato sauce and paste. Low-sodium or reduced-sodium tomato and vegetable juices.  Fruits Fresh, frozen, and canned fruit. Fruit juice.  Meat and Other Protein Products Low-sodium canned tuna and salmon. Fresh or frozen  meat, poultry, seafood, and fish. Lamb. Unsalted nuts. Dried beans, peas, and lentils without added salt. Unsalted canned beans. Homemade soups without salt. Eggs.  Dairy Milk. Soy milk. Ricotta cheese. Low-sodium or reduced-sodium cheeses. Yogurt.  Condiments Fresh and dried herbs and spices. Salt-free seasonings. Onion and garlic powders. Low-sodium varieties of mustard and ketchup. Fresh or refrigerated horseradish. Lemon juice.  Fats and Oils Reduced-sodium salad dressings. Unsalted butter.  Other Unsalted popcorn and pretzels.  The items listed above may not be a complete list of recommended foods or beverages. Contact your dietitian for more options. WHAT FOODS ARE NOT RECOMMENDED? Grains Instant hot cereals. Bread stuffing, pancake, and biscuit mixes. Croutons. Seasoned rice or pasta mixes. Noodle soup cups. Boxed or frozen macaroni and cheese. Self-rising flour. Regular salted crackers. Vegetables Regular canned vegetables. Regular canned tomato sauce and paste. Regular tomato and vegetable juices. Frozen vegetables in sauces. Salted French fries. Olives. Pickles. Relishes. Sauerkraut. Salsa. Meat and Other Protein Products Salted, canned, smoked, spiced, or pickled meats, seafood, or fish. Bacon, ham, sausage, hot dogs, corned beef, chipped beef, and packaged luncheon meats. Salt pork. Jerky. Pickled herring. Anchovies, regular canned tuna, and sardines. Salted nuts. Dairy Processed cheese and cheese spreads. Cheese curds. Blue cheese and cottage cheese. Buttermilk.  Condiments Onion and garlic salt, seasoned salt, table salt, and sea salt. Canned and packaged gravies. Worcestershire sauce. Tartar sauce. Barbecue sauce. Teriyaki sauce. Soy sauce, including reduced sodium. Steak sauce. Fish sauce. Oyster sauce. Cocktail sauce. Horseradish that you find on the shelf. Regular ketchup and mustard. Meat flavorings and tenderizers. Bouillon cubes. Hot sauce. Tabasco sauce.  Marinades. Taco seasonings. Relishes. Fats and Oils Regular salad dressings. Salted butter. Margarine. Ghee. Bacon fat.  Other Potato and tortilla chips. Corn chips and puffs. Salted popcorn and pretzels. Canned or dried soups. Pizza. Frozen entrees and pot pies.  The items listed above may not be a complete list of foods and beverages to avoid. Contact your dietitian for more information.   This information is not intended to replace advice given to you by your health care provider. Make sure you discuss any questions you have with your health care provider.   Document Released: 10/08/2001 Document Revised: 05/09/2014 Document Reviewed: 02/20/2013 Elsevier Interactive Patient Education 2016 [employer].  

## 2016-01-15 ENCOUNTER — Telehealth (HOSPITAL_COMMUNITY): Payer: Self-pay | Admitting: *Deleted

## 2016-01-15 ENCOUNTER — Encounter (HOSPITAL_COMMUNITY): Payer: Self-pay

## 2016-01-15 ENCOUNTER — Ambulatory Visit (HOSPITAL_COMMUNITY)
Admit: 2016-01-15 | Discharge: 2016-01-15 | Disposition: A | Discharge status: Home / Self Care | Payer: PPO | Attending: Cardiology | Admitting: Cardiology

## 2016-01-15 VITALS — BP 110/72 | HR 95 | Wt 203.8 lb

## 2016-01-15 DIAGNOSIS — Z86711 Personal history of pulmonary embolism: Secondary | ICD-10-CM | POA: Insufficient documentation

## 2016-01-15 DIAGNOSIS — R0609 Other forms of dyspnea: Secondary | ICD-10-CM | POA: Diagnosis not present

## 2016-01-15 DIAGNOSIS — Z7984 Long term (current) use of oral hypoglycemic drugs: Secondary | ICD-10-CM | POA: Diagnosis not present

## 2016-01-15 DIAGNOSIS — G894 Chronic pain syndrome: Secondary | ICD-10-CM | POA: Diagnosis not present

## 2016-01-15 DIAGNOSIS — R5383 Other fatigue: Secondary | ICD-10-CM | POA: Diagnosis not present

## 2016-01-15 DIAGNOSIS — I130 Hypertensive heart and chronic kidney disease with heart failure and stage 1 through stage 4 chronic kidney disease, or unspecified chronic kidney disease: Secondary | ICD-10-CM | POA: Insufficient documentation

## 2016-01-15 DIAGNOSIS — I5022 Chronic systolic (congestive) heart failure: Secondary | ICD-10-CM | POA: Insufficient documentation

## 2016-01-15 DIAGNOSIS — I252 Old myocardial infarction: Secondary | ICD-10-CM | POA: Insufficient documentation

## 2016-01-15 DIAGNOSIS — F17210 Nicotine dependence, cigarettes, uncomplicated: Secondary | ICD-10-CM | POA: Diagnosis not present

## 2016-01-15 DIAGNOSIS — Z79899 Other long term (current) drug therapy: Secondary | ICD-10-CM | POA: Diagnosis not present

## 2016-01-15 DIAGNOSIS — Z7901 Long term (current) use of anticoagulants: Secondary | ICD-10-CM | POA: Diagnosis not present

## 2016-01-15 DIAGNOSIS — I429 Cardiomyopathy, unspecified: Secondary | ICD-10-CM | POA: Insufficient documentation

## 2016-01-15 DIAGNOSIS — E785 Hyperlipidemia, unspecified: Secondary | ICD-10-CM | POA: Diagnosis not present

## 2016-01-15 DIAGNOSIS — N183 Chronic kidney disease, stage 3 unspecified: Secondary | ICD-10-CM

## 2016-01-15 LAB — BRAIN NATRIURETIC PEPTIDE: B NATRIURETIC PEPTIDE 5: 265.5 pg/mL — AB (ref 0.0–100.0)

## 2016-01-15 LAB — BASIC METABOLIC PANEL
Anion gap: 13 (ref 5–15)
BUN: 34 mg/dL — ABNORMAL HIGH (ref 6–20)
CHLORIDE: 96 mmol/L — AB (ref 101–111)
CO2: 19 mmol/L — ABNORMAL LOW (ref 22–32)
Calcium: 9.7 mg/dL (ref 8.9–10.3)
Creatinine, Ser: 1.44 mg/dL — ABNORMAL HIGH (ref 0.44–1.00)
GFR calc non Af Amer: 39 mL/min — ABNORMAL LOW (ref >60)
GFR, EST AFRICAN AMERICAN: 45 mL/min — AB (ref >60)
Glucose, Bld: 229 mg/dL — ABNORMAL HIGH (ref 65–99)
POTASSIUM: 5 mmol/L (ref 3.5–5.1)
SODIUM: 128 mmol/L — AB (ref 135–145)

## 2016-01-15 LAB — C-REACTIVE PROTEIN: CRP: 4.4 mg/L (ref <8.0)

## 2016-01-15 MED ORDER — DIGOXIN 125 MCG PO TABS: 0.1250 mg | ORAL_TABLET | Freq: Every day | ORAL | 3 refills | Status: DC

## 2016-01-15 MED ORDER — SPIRONOLACTONE 25 MG PO TABS: 25.0000 mg | ORAL_TABLET | Freq: Every day | ORAL | 6 refills | Status: DC

## 2016-01-15 NOTE — Patient Instructions (Signed)
START Digoxin 0.125 mg tablet once daily.  INCREASE Spironolactone to 25 mg (1 whole tablet) once daily.  Will refer you to Cardiac Rehab at [facility]. They will call you to set up initial appointment.  Routine lab work today. Will notify you of abnormal results, otherwise no news is good news!  Follow up 2-3 weeks with Dr. Shirlee Latch.  Do the following things EVERYDAY: 1) Weigh yourself in the morning before breakfast. Write it down and keep it in a log. 2) Take your medicines as prescribed 3) Eat low salt foods-Limit salt (sodium) to 2000 mg per day.  4) Stay as active as you can everyday 5) Limit all fluids for the day to less than 2 liters

## 2016-01-15 NOTE — Progress Notes (Signed)
PCP:Dr Clelia CroftShaw  Primary HF Cardiologist: Dr Shirlee LatchMcLean  HPI: Kaitlin GerlachDonna R McClintockis a 60 y.o.female with history of COPD, tobacco abuse, DM, HTN, arthritis, and chronic pain syndrome who [facility].  She had had several months of increasing exertional dyspnea prior to this.  CT angio 12/29/15 showed submassive PE with concerns for right heart strain. Now on Xarelto.  Echo 12/30/15 showed LVEF 20-25%, Grade 3 DD, Mod MR, mod LAE, Mild RV dilation with mildly reduced systolic function, Moderate RAE, Moderate TR. Peak PA pressure 54 mm Hg.  Cardiac MRI (9/17) showed EF 15%, moderate LV dilation, mild RV dilation/moderately decreased RV systolic function, no LGE.  Father and brother both have had HF and CKD. No MIs. DOE walking from bed to doorway.  Smokes 1 ppd (x 42 years). No ETOH or drug use. No history of cancer or recent surgery, no long car/plane trips.   She diuresed in the hospital and was begun on Xarelto.   She returns for post hospital follow up. Complaining of fatigue and dyspnea with exertion, can go about 100-200 feet before stopping. Says fatigue seems slightly better. Denies PND/Orthopnea. Weight at home 199-202 pounds. Smoking 2-3 cigarettes per day. Lives with her husband. Disabled from back pain.   ECG: NSR with septal Qs.   PMH: 1. PE: 12/2015 CT angio 12/29/15 submassive PE with concerns for right heart strain. Now on Xarelto.  2. Cardiomyopathy:  Found 8/17.  - [facility] 01/01/2016: EF 15%, moderate LV dilation, mild RV dilation/moderately decreased RV systolic function.  No LGE: no infiltrative or inflammatory process noted, no evidence for prior MI. - ECHO  12/30/2015: EF 20-25%. Grade III DD, Mod TR, PASP 54, mildly decreased RV systolic function.  3. ABIs 01/05/2016 normal   4. COPD: Active smoker.  5. Type II diabetes 6. HTN 7. Chronic pain syndrome  Labs (9/17): SPEP negative, HIV negative, K 4.4, creatinine 1.22  ROS: All systems negative except as listed in  HPI, PMH and Problem List.  SH:  Social History   Social History  . Marital status: Married    Spouse name: N/A  . Number of children: 0  . Years of education: N/A   Occupational History  . retired    Social History Main Topics  . Smoking status: Current Every Day Smoker    Packs/day: 1.00    Years: 40.00    Types: Cigarettes  . Smokeless tobacco: Never Used  . Alcohol use No  . Drug use: No  . Sexual activity: Not on file   Other Topics Concern  . Not on file   Social History Narrative   Exercise walking daily (varies)    FH:  Family History  Problem Relation Age of Onset  . Colon polyps Father   . Diabetes Father     mother  . Heart disease Father   . Heart murmur Mother   . Diabetes Mother   . Hypertension Mother   . Diabetes Brother   . Cancer Maternal Grandmother   . Diabetes Paternal Grandmother   . Heart disease Paternal Grandmother   . COPD Paternal Grandfather   . Heart disease Paternal Grandfather   . Hypertension Brother   . Hypertension Brother     Current Outpatient Prescriptions  Medication Sig Dispense Refill  . azelastine (OPTIVAR) 0.05 % ophthalmic solution Place 1 drop into both eyes 2 (two) times daily. 6 mL 12  . budesonide-formoterol (SYMBICORT) 160-4.5 MCG/ACT inhaler Inhale 1 puff into the  lungs 2 (two) times daily. 1 Inhaler 12  . carvedilol (COREG) 3.125 MG tablet Take 1 tablet (3.125 mg total) by mouth 2 (two) times daily with a meal. 60 tablet 1  . clotrimazole-betamethasone (LOTRISONE) cream Apply 1 application topically 2 (two) times daily. (Patient not taking: Reported on 01/14/2016) 45 g 2  . cyclobenzaprine (FLEXERIL) 10 MG tablet TAKE ONE TABLET BY MOUTH THREE TIMES DAILY AS NEEDED FOR MUSCLE SPASMS 270 tablet 1  . diazepam (VALIUM) 5 MG tablet TAKE ONE TABLET BY MOUTH AT BEDTIME AND  MAY  REPEAT  DOSE  ONE  TIME  IF  NEEDED 180 tablet 1  . furosemide (LASIX) 40 MG tablet Take 1 tablet (40 mg total) by mouth daily. 30 tablet 1   . gabapentin (NEURONTIN) 300 MG capsule Take 1 capsule (300 mg total) by mouth 3 (three) times daily. Titrate up slowely 90 capsule 3  . glimepiride (AMARYL) 2 MG tablet TAKE ONE TABLET BY MOUTH TWICE DAILY 180 tablet 0  . HYDROcodone-acetaminophen (NORCO) 7.5-325 MG tablet Take 1 tablet by mouth every 6 (six) hours as needed for pain.    Marland Kitchen ipratropium (ATROVENT HFA) 17 MCG/ACT inhaler Inhale 2 puffs into the lungs 4 (four) times daily. 1 Inhaler 12  . Krill Oil CAPS Take 1 capsule by mouth daily.     Marland Kitchen lovastatin (MEVACOR) 20 MG tablet TAKE 2 TABLETS BY MOUTH DAILY AT 6PM 180 tablet 0  . metFORMIN (GLUCOPHAGE) 1000 MG tablet TAKE ONE TABLET BY MOUTH TWICE DAILY WITH  MEALS. 180 tablet 0  . omeprazole (PRILOSEC) 20 MG capsule Take 1 capsule (20 mg total) by mouth daily. 1 capsule 0  . potassium chloride SA (K-DUR,KLOR-CON) 20 MEQ tablet Take 1 tablet (20 mEq total) by mouth daily. With the lasix 30 tablet 0  . ranitidine (ZANTAC) 150 MG tablet TAKE ONE TABLET BY MOUTH TWICE DAILY. (Patient not taking: Reported on 01/14/2016) 180 tablet 0  . Rivaroxaban (XARELTO) 15 MG TABS tablet Take 1 tablet (15 mg total) by mouth 2 (two) times daily with a meal. 42 tablet 0  . [START ON 01/21/2016] rivaroxaban (XARELTO) 20 MG TABS tablet Take 1 tablet (20 mg total) by mouth daily. 30 tablet 1  . sacubitril-valsartan (ENTRESTO) 24-26 MG Take 1 tablet by mouth 2 (two) times daily. 60 tablet 1  . spironolactone (ALDACTONE) 25 MG tablet Take 0.5 tablets (12.5 mg total) by mouth daily. 30 tablet 1  . traMADol (ULTRAM) 50 MG tablet Take 50 mg by mouth every 6 (six) hours as needed for pain.    . varenicline (CHANTIX STARTING MONTH PAK) 0.5 MG X 11 & 1 MG X 42 tablet Take one 0.5 mg tablet by mouth once daily for 3 days, then increase to one 0.5 mg tablet twice daily for 4 days, then increase to one 1 mg tablet twice daily. 53 tablet 0   No current facility-administered medications for this encounter.     Vitals:    01/15/16 0938  BP: 110/72  Pulse: 95  SpO2: 100%  Weight: 203 lb 12.8 oz (92.4 kg)    PHYSICAL EXAM:  General:  Fatigued appearing. Well appearing. No resp difficulty HEENT: normal Neck: supple. JVP flat. Carotids 2+ bilaterally; no bruits. No lymphadenopathy or thryomegaly appreciated. Cor: PMI normal. Regular rate & rhythm. No rubs, gallops or murmurs. Lungs: clear Abdomen: soft, nontender, nondistended. No hepatosplenomegaly. No bruits or masses. Good bowel sounds. Extremities: no cyanosis, clubbing, rash, edema. R and LLE varicosities Neuro: alert &  orientedx3, cranial nerves grossly intact. Moves all 4 extremities w/o difficulty. Affect pleasant.  ASSESSMENT & PLAN: 1. H/O  PE: Submassive on CT.  Etiology uncertain: no known cancer, long trip, recent surgery.  She is fairly active.  Possibly related to stasis in the setting of cardiomyopathy with severely decreased systolic function.  - Given cardiomyopathy and submassive PE with no certain trigger, think she will need long-term anticoagulation.  She is on Xarelto.  2. Chronic systolic CHF: ECHO 12/2015 EF 15-20% with diffuse hypokinesis.  Uncertain etiology.  No chest pain. Symptoms began as orthopnea around 6/17, then developed progressive exertional dyspnea. ECG shows anteroseptal MI but no change from 2013 ECG. Cardiac MRI did not show LGE. HIV negative, thyroid indices ok (mild TSH elevation but normal T3/T4), SPEP negative.  NYHA class III symptoms with fatigue most prominent.  Not volume overloaded on exam.  - Ultimately, needs right and left heart cath for CAD evaluation.  However, given absence of chest pain/ACS and acute admission with submassive PE, would avoid holding anticoagulation for cath for 3 months (end of November).    - Continue carvedilol 3.125 mg twice daily. Will not increase with profound fatigue.  - Add digoxin 0.125 mg daily.  - Continue Entresto 24/26 bid - Increase spiro to 25 mg daily.  - Continue Lasix  40 daily.  - BMET today, BMET + digoxin level in 2 wks.  - Repeat echo in 6 months to look for recovery.  QRS not wide so would not be CRT candidate.  - Will refer to cardiac rehab.  3. CKD: Stage III. Check BMET today.   4. Active smoker: Counseled to quit. She has a prescription for Chantix.  5. Hyperlipidemia: On lovastatin.  Follow up in 2 weeks.  Kaitlin Cantrell 01/15/2016

## 2016-01-15 NOTE — Telephone Encounter (Signed)
Notes Recorded by Modesta Messing, CMA on 01/15/2016 at 4:24 PM EDT Patient aware. And added to lab schedule 9/22 ------  Notes Recorded by Laurey Morale, MD on 01/15/2016 at 4:14 PM EDT Stop supplemental K. Also needs to cut back on po fluid intake with low sodium. Repeat BMET 1 week.    Ref Range & Units 10:20 10d ago 11d ago   Sodium 135 - 145 mmol/L 128   137  131     Potassium 3.5 - 5.1 mmol/L 5.0  4.4  4.6    Chloride 101 - 111 mmol/L 96   98   90     CO2 22 - 32 mmol/L 19   32  33     Glucose, Bld 65 - 99 mg/dL 270   786   754     BUN 6 - 20 mg/dL 34   31   42     Creatinine, Ser 0.44 - 1.00 mg/dL 4.92   0.10   0.71     Calcium 8.9 - 10.3 mg/dL 9.7  9.5  9.2    GFR calc non Af Amer >60 mL/min 39   48   37     GFR calc Af Amer >60 mL/min 45   55CM   43CM

## 2016-01-22 ENCOUNTER — Telehealth (HOSPITAL_COMMUNITY): Payer: Self-pay | Admitting: *Deleted

## 2016-01-22 ENCOUNTER — Ambulatory Visit (HOSPITAL_COMMUNITY)
Admission: RE | Admit: 2016-01-22 | Discharge: 2016-01-22 | Disposition: A | Discharge status: Home / Self Care | Payer: PPO | Source: Ambulatory Visit | Attending: Cardiology | Admitting: Cardiology

## 2016-01-22 DIAGNOSIS — I10 Essential (primary) hypertension: Secondary | ICD-10-CM | POA: Diagnosis not present

## 2016-01-22 DIAGNOSIS — I429 Cardiomyopathy, unspecified: Secondary | ICD-10-CM | POA: Insufficient documentation

## 2016-01-22 DIAGNOSIS — I5022 Chronic systolic (congestive) heart failure: Secondary | ICD-10-CM

## 2016-01-22 LAB — BASIC METABOLIC PANEL
ANION GAP: 9 (ref 5–15)
BUN: 35 mg/dL — ABNORMAL HIGH (ref 6–20)
CALCIUM: 9.8 mg/dL (ref 8.9–10.3)
CO2: 26 mmol/L (ref 22–32)
Chloride: 95 mmol/L — ABNORMAL LOW (ref 101–111)
Creatinine, Ser: 1.56 mg/dL — ABNORMAL HIGH (ref 0.44–1.00)
GFR, EST AFRICAN AMERICAN: 41 mL/min — AB (ref >60)
GFR, EST NON AFRICAN AMERICAN: 36 mL/min — AB (ref >60)
Glucose, Bld: 182 mg/dL — ABNORMAL HIGH (ref 65–99)
Potassium: 5.7 mmol/L — ABNORMAL HIGH (ref 3.5–5.1)
Sodium: 130 mmol/L — ABNORMAL LOW (ref 135–145)

## 2016-01-22 NOTE — Telephone Encounter (Signed)
-----   Message from Laurey Morale, MD sent at 01/22/2016  1:44 PM EDT ----- Stop supplemental KCl.  Stop spironolactone for now.  Repeat BMET on Monday.  Low K diet. Call today.

## 2016-01-22 NOTE — Telephone Encounter (Signed)
Notes Recorded by Noralee Space, RN on 01/22/2016 at 3:32 PM EDT Patient aware. Repeat labs sch 9/25

## 2016-01-25 ENCOUNTER — Ambulatory Visit (HOSPITAL_COMMUNITY)
Admission: RE | Admit: 2016-01-25 | Discharge: 2016-01-25 | Disposition: A | Discharge status: Home / Self Care | Payer: PPO | Source: Ambulatory Visit | Attending: Cardiology | Admitting: Cardiology

## 2016-01-25 ENCOUNTER — Telehealth (HOSPITAL_COMMUNITY): Payer: Self-pay | Admitting: Cardiology

## 2016-01-25 DIAGNOSIS — I5022 Chronic systolic (congestive) heart failure: Secondary | ICD-10-CM | POA: Diagnosis not present

## 2016-01-25 DIAGNOSIS — I509 Heart failure, unspecified: Secondary | ICD-10-CM

## 2016-01-25 LAB — BASIC METABOLIC PANEL
Anion gap: 12 (ref 5–15)
BUN: 37 mg/dL — AB (ref 6–20)
CO2: 22 mmol/L (ref 22–32)
CREATININE: 1.76 mg/dL — AB (ref 0.44–1.00)
Calcium: 9.9 mg/dL (ref 8.9–10.3)
Chloride: 97 mmol/L — ABNORMAL LOW (ref 101–111)
GFR calc Af Amer: 36 mL/min — ABNORMAL LOW (ref >60)
GFR, EST NON AFRICAN AMERICAN: 31 mL/min — AB (ref >60)
GLUCOSE: 160 mg/dL — AB (ref 65–99)
POTASSIUM: 5.8 mmol/L — AB (ref 3.5–5.1)
SODIUM: 131 mmol/L — AB (ref 135–145)

## 2016-01-25 NOTE — Telephone Encounter (Signed)
-----   Message from Laurey Morale, MD sent at 01/25/2016 11:23 AM EDT ----- Leave off spironolactone.  Make sure she is not taking any K supplement.  Hold Lasix x 2 days then decrease Lasix to every other day.  Repeat BMET on Thursday.

## 2016-01-25 NOTE — Telephone Encounter (Signed)
Patient aware. Voiced understanding. Repeat labs 9/29

## 2016-01-29 ENCOUNTER — Ambulatory Visit (HOSPITAL_COMMUNITY)
Admission: RE | Admit: 2016-01-29 | Discharge: 2016-01-29 | Disposition: A | Discharge status: Home / Self Care | Payer: PPO | Source: Ambulatory Visit | Attending: Cardiology | Admitting: Cardiology

## 2016-01-29 DIAGNOSIS — I509 Heart failure, unspecified: Secondary | ICD-10-CM

## 2016-01-29 LAB — BASIC METABOLIC PANEL
ANION GAP: 9 (ref 5–15)
BUN: 24 mg/dL — ABNORMAL HIGH (ref 6–20)
CO2: 29 mmol/L (ref 22–32)
Calcium: 10 mg/dL (ref 8.9–10.3)
Chloride: 98 mmol/L — ABNORMAL LOW (ref 101–111)
Creatinine, Ser: 1.57 mg/dL — ABNORMAL HIGH (ref 0.44–1.00)
GFR calc Af Amer: 41 mL/min — ABNORMAL LOW (ref >60)
GFR, EST NON AFRICAN AMERICAN: 35 mL/min — AB (ref >60)
GLUCOSE: 179 mg/dL — AB (ref 65–99)
POTASSIUM: 5.4 mmol/L — AB (ref 3.5–5.1)
Sodium: 136 mmol/L (ref 135–145)

## 2016-02-03 NOTE — Progress Notes (Signed)
Subjective:    Patient ID: Kaitlin Cantrell, female    DOB: 12-04-1955, 60 y.o.   MRN: [id number] Chief Complaint  Patient presents with  . Follow-up    DIABETES  . Medication Refill    ALL RXs Dr Brigitte Pulse prescribes    HPI  Kaitlin Cantrell is a delightful 60 woman here for follow-up. She is having a lot of problems with her potassium staying high and repeated recheck.  PE with right heart strain 8/9. On xarelto permanently now as no risk factor or inciting even found.  CHF and cardiomyopathy: New diagnosis last mo, ER was 15-25%.  Seeing cardiology who is planning for right and left heart cath after she has been stable on the xarelto for sev mos and would be sufficiently safe for her to come off during the procedure.  On lasix 80m qd in addition to Entresto (sacubitril-valsartan), digoxin, Coreg, and spironolactone.  Is having a lot of fatigue even with minimal activity.  Weight:   Seeing cardiology Dr. MAundra Dubinwho had her hold her spironolactone and K and then decreased lasix to qod after she had persistent hyperkalemia.  COPD with ongoing tobacco abuse 1 ppd x 42 yrs. Pt wants to do trial of chantix but knows that she needs to be in a mentally stable/ok place to start and is currently not.  On symbicort bid  DM: a1c 6.5 ->7.1 -> 6.9 4 mos piror.  Urine microalb nml 8 mos prior (01/17).  Saw optho Dr. GKaty Fitch11/16.  On amaryl 239mbid and metformin 1000 bid. On acei/arb.  HTN:  On carvedilol 3.125 bid  Chronic pain syndrome from arthritis: prn flexeril,qhs valium, gabapentin 300 tid, hydrocodone 7.5, and tramadol  HPL: On lovastatin 20. LDL Goal <70 - now slihtly above goal at least check 3 mos prior LDL 87, non HDL 111  CKDIII:  Past Medical History:  Diagnosis Date  . Allergy    seasonal  . Arthritis   . Diabetes mellitus   . GERD (gastroesophageal reflux disease)   . History of pneumonia    15 years ago  . Hyperlipidemia   . Hypertension   . PE (pulmonary embolism) 12/2015   Past  Surgical History:  Procedure Laterality Date  . ANKLE FRACTURE SURGERY  1974   left and pinned then pins removed  . ANKLE GANGLION CYST EXCISION  1994   right  . BACK SURGERY  1998   discectomy and then fusion  . KNEE ARTHROPLASTY  02/14/2012   Procedure: COMPUTER ASSISTED TOTAL KNEE ARTHROPLASTY;  Surgeon: GrMeredith PelMD;  [facility];  Laterality: Left;  Left total knee arthroplasty  . TONSILLECTOMY  1972  . TUBAL LIGATION  1996  . UMBILICAL HERNIA REPAIR  1996   Current Outpatient Prescriptions on File Prior to Visit  Medication Sig Dispense Refill  . azelastine (OPTIVAR) 0.05 % ophthalmic solution Place 1 drop into both eyes 2 (two) times daily. 6 mL 12  . budesonide-formoterol (SYMBICORT) 160-4.5 MCG/ACT inhaler Inhale 1 puff into the lungs 2 (two) times daily. 1 Inhaler 12  . carvedilol (COREG) 3.125 MG tablet Take 1 tablet (3.125 mg total) by mouth 2 (two) times daily with a meal. 60 tablet 1  . cyclobenzaprine (FLEXERIL) 10 MG tablet TAKE ONE TABLET BY MOUTH THREE TIMES DAILY AS NEEDED FOR MUSCLE SPASMS 270 tablet 1  . digoxin (LANOXIN) 0.125 MG tablet Take 1 tablet (0.125 mg total) by mouth daily. 30 tablet 3  . furosemide (  LASIX) 40 MG tablet Take 1 tablet (40 mg total) by mouth daily. 30 tablet 1  . gabapentin (NEURONTIN) 300 MG capsule Take 1 capsule (300 mg total) by mouth 3 (three) times daily. Titrate up slowely 90 capsule 3  . ipratropium (ATROVENT HFA) 17 MCG/ACT inhaler Inhale 2 puffs into the lungs 4 (four) times daily. 1 Inhaler 12  . Krill Oil CAPS Take 1 capsule by mouth daily.     Marland Kitchen omeprazole (PRILOSEC) 20 MG capsule Take 1 capsule (20 mg total) by mouth daily. 1 capsule 0  . ranitidine (ZANTAC) 150 MG tablet TAKE ONE TABLET BY MOUTH TWICE DAILY. 180 tablet 0  . Rivaroxaban (XARELTO) 15 MG TABS tablet Take 1 tablet (15 mg total) by mouth 2 (two) times daily with a meal. 42 tablet 0  . sacubitril-valsartan (ENTRESTO) 24-26 MG Take  1 tablet by mouth 2 (two) times daily. 60 tablet 1  . rivaroxaban (XARELTO) 20 MG TABS tablet Take 1 tablet (20 mg total) by mouth daily. (Patient not taking: Reported on 02/04/2016) 30 tablet 1  . varenicline (CHANTIX STARTING MONTH PAK) 0.5 MG X 11 & 1 MG X 42 tablet Take one 0.5 mg tablet by mouth once daily for 3 days, then increase to one 0.5 mg tablet twice daily for 4 days, then increase to one 1 mg tablet twice daily. (Patient not taking: Reported on 02/04/2016) 53 tablet 0   No current facility-administered medications on file prior to visit.    Allergies  Allergen Reactions  . Albuterol     Throat/lips swelling  . Morphine And Related Other (See Comments)    Very aggressive and doesn't help pain   Family History  Problem Relation Age of Onset  . Colon polyps Father   . Diabetes Father     mother  . Heart disease Father   . Heart murmur Mother   . Diabetes Mother   . Hypertension Mother   . Diabetes Brother   . Cancer Maternal Grandmother   . Diabetes Paternal Grandmother   . Heart disease Paternal Grandmother   . COPD Paternal Grandfather   . Heart disease Paternal Grandfather   . Hypertension Brother   . Hypertension Brother    Social History   Social History  . Marital status: Married    Spouse name: N/A  . Number of children: 0  . Years of education: N/A   Occupational History  . retired    Social History Main Topics  . Smoking status: Current Every Day Smoker    Packs/day: 1.00    Years: 40.00    Types: Cigarettes  . Smokeless tobacco: Never Used  . Alcohol use No  . Drug use: No  . Sexual activity: Not Asked   Other Topics Concern  . None   Social History Narrative   Exercise walking daily (varies)     Review of Systems See HPI    Objective:   Physical Exam  Constitutional: She is oriented to person, place, and time. She appears well-developed and well-nourished. No distress.  HENT:  Head: Normocephalic and atraumatic.  Right Ear:  External ear normal.  Left Ear: External ear normal.  Eyes: Conjunctivae are normal. No scleral icterus.  Neck: Normal range of motion. Neck supple. No thyromegaly present.  Cardiovascular: Regular rhythm, normal heart sounds and intact distal pulses.  Tachycardia present.   Pulmonary/Chest: Effort normal. No respiratory distress. She has decreased breath sounds. She has no rales.  Musculoskeletal: She exhibits no edema.  Lymphadenopathy:    She has no cervical adenopathy.  Neurological: She is alert and oriented to person, place, and time.  Skin: Skin is warm and dry. She is not diaphoretic. No erythema.  Psychiatric: Her speech is normal. Judgment and thought content normal. She is agitated. Cognition and memory are normal. She exhibits a depressed mood.  Tearful throughout visit Struggling with a lot of guilt about her parents' passing (natural causes but she was main caretaker)      Assessment & Plan:  sched AWV/CPE at next avail   Stop metformin double up on amaryl; Recheck in 3-4 mos Likely place referral to nephrology  Having a lot of depression, fatigue, activity limitations since her CHF diagnosis. Really overwhelmed about not being able to eat anything - everyone gives her lists of what she can't eat and she needs to know what she can as most of the veggies that are good for DM are on the high K list. - refer to DM ed/nutrition  1. Other acute pulmonary embolism with acute cor pulmonale (HCC)   2. Cardiomyopathy, unspecified type ([place])   3. Acute combined systolic and diastolic congestive heart failure ([place])   4. Chronic obstructive pulmonary disease, unspecified COPD type ([place])   5. Type 2 diabetes mellitus without complication, without long-term current use of insulin (HCC) - worsening with hgba1c increasing from 6.9 -> 7.5 today some as pt is having trouble sticking to a low carb diet while also following a low sodium and low potassium diet.  Stop metformin since recent Cr  have all been >1.5, increase glimepiride from 2 bid to 4 bid.  6. Essential hypertension   7. Chronic pain syndrome - refilled chronic hydrocodone 7.5 #90/mo and tramadol (420m/d total). Doses unchanged for years  8. Hyperlipidemia, unspecified hyperlipidemia type - goal LDL has decreased to <70 with recent cardiac complications, LDL today higher than prior, pt has been on lovastatin 468mqd for years so d/c and start atorvastatin 40.  9. Polypharmacy   10. Tobacco abuse - pt has rx for chantix at home but has not started it yet as she has been having such a flair of depression and grief -she needs to be in a mentally stable/safe place prior to trying this.  11. Need for prophylactic vaccination and inoculation against influenza - pt given flu shot today   I think pt needs a pneumovax - she is very sure that she received a pneumococcal vaccine last yr with her tetanus but we have a flu shot being given that day with tdap and pt sure she only had 2 vaccinations on that day - rediscuss at f/u.  Orders Placed This Encounter  Procedures  . Flu Vaccine QUAD 36+ mos IM  . TSH  . Lipid panel    Order Specific Question:   Has the patient fasted?    Answer:   Yes  . BASIC METABOLIC PANEL WITH GFR  . Hemoglobin A1c  . Ambulatory referral to diabetic education    Referral Priority:   Urgent    Referral Type:   Consultation    Referral Reason:   Specialty Services Required    Number of Visits Requested:   1  . POCT glucose (manual entry)    Meds ordered this encounter  Medications  . glimepiride (AMARYL) 4 MG tablet    Sig: Take 1 tablet (4 mg total) by mouth 2 (two) times daily.    Dispense:  180 tablet    Refill:  1  .  diazepam (VALIUM) 5 MG tablet    Sig: TAKE ONE TABLET BY MOUTH AT BEDTIME AND  MAY  REPEAT  DOSE  ONE  TIME  IF  NEEDED    Dispense:  180 tablet    Refill:  1  . HYDROcodone-acetaminophen (NORCO) 7.5-325 MG tablet    Sig: Take 1 tablet by mouth every 6 (six) hours as  needed.    Dispense:  90 tablet    Refill:  0  . traMADol (ULTRAM) 50 MG tablet    Sig: Take 2 tablets (100 mg total) by mouth every 6 (six) hours as needed.    Dispense:  240 tablet    Refill:  5  . HYDROcodone-acetaminophen (NORCO) 7.5-325 MG tablet    Sig: Take 1 tablet by mouth every 6 (six) hours as needed for moderate pain.    Dispense:  90 tablet    Refill:  0    May fill 30d from date written  . HYDROcodone-acetaminophen (NORCO) 7.5-325 MG tablet    Sig: Take 1 tablet by mouth every 6 (six) hours as needed for moderate pain.    Dispense:  90 tablet    Refill:  0    May fill 60d from date written  . HYDROcodone-acetaminophen (NORCO) 7.5-325 MG tablet    Sig: Take 1 tablet by mouth every 6 (six) hours as needed for moderate pain.    Dispense:  90 tablet    Refill:  0    May fill 90d from date written  . blood glucose meter kit and supplies KIT    Sig: Dispense based on patient and insurance preference. Use up to four times daily as directed. (FOR ICD-9 250.00, 250.01).    Dispense:  1 each    Refill:  0    Due to medication change, hypoglycemia, hypertension. Please dispense whatever type if preferred by pt and/or her insurance.    Order Specific Question:   Number of strips    Answer:   1000    Order Specific Question:   Number of lancets    Answer:   1000  . atorvastatin (LIPITOR) 40 MG tablet    Sig: Take 1 tablet (40 mg total) by mouth daily.    Dispense:  90 tablet    Refill:  0   Over 40 min spent in face-to-face evaluation of and consultation with patient and coordination of care.  Over 50% of this time was spent counseling this patient.   Delman Cheadle, M.D.  [facility] [address] [place], [place] [address] [phone] phone [phone] fax  02/05/16 1:07 PM

## 2016-02-04 ENCOUNTER — Ambulatory Visit (INDEPENDENT_AMBULATORY_CARE_PROVIDER_SITE_OTHER): Payer: PPO | Admitting: Family Medicine

## 2016-02-04 ENCOUNTER — Encounter: Payer: Self-pay | Admitting: Family Medicine

## 2016-02-04 VITALS — BP 98/60 | HR 100 | Temp 98.2°F | Resp 16 | Ht 66.0 in | Wt 202.6 lb

## 2016-02-04 DIAGNOSIS — I5041 Acute combined systolic (congestive) and diastolic (congestive) heart failure: Secondary | ICD-10-CM

## 2016-02-04 DIAGNOSIS — G894 Chronic pain syndrome: Secondary | ICD-10-CM | POA: Diagnosis not present

## 2016-02-04 DIAGNOSIS — Z720 Tobacco use: Secondary | ICD-10-CM | POA: Diagnosis not present

## 2016-02-04 DIAGNOSIS — I10 Essential (primary) hypertension: Secondary | ICD-10-CM | POA: Diagnosis not present

## 2016-02-04 DIAGNOSIS — I429 Cardiomyopathy, unspecified: Secondary | ICD-10-CM | POA: Diagnosis not present

## 2016-02-04 DIAGNOSIS — Z23 Encounter for immunization: Secondary | ICD-10-CM

## 2016-02-04 DIAGNOSIS — E119 Type 2 diabetes mellitus without complications: Secondary | ICD-10-CM | POA: Diagnosis not present

## 2016-02-04 DIAGNOSIS — J449 Chronic obstructive pulmonary disease, unspecified: Secondary | ICD-10-CM | POA: Diagnosis not present

## 2016-02-04 DIAGNOSIS — E785 Hyperlipidemia, unspecified: Secondary | ICD-10-CM

## 2016-02-04 DIAGNOSIS — Z79899 Other long term (current) drug therapy: Secondary | ICD-10-CM | POA: Diagnosis not present

## 2016-02-04 DIAGNOSIS — I2609 Other pulmonary embolism with acute cor pulmonale: Secondary | ICD-10-CM

## 2016-02-04 LAB — GLUCOSE, POCT (MANUAL RESULT ENTRY): POC GLUCOSE: 221 mg/dL — AB (ref 70–99)

## 2016-02-04 LAB — TSH: TSH: 1.53 mIU/L

## 2016-02-04 MED ORDER — HYDROCODONE-ACETAMINOPHEN 7.5-325 MG PO TABS: 1.0000 | ORAL_TABLET | Freq: Four times a day (QID) | ORAL | 0 refills | Status: DC | PRN

## 2016-02-04 MED ORDER — DIAZEPAM 5 MG PO TABS: ORAL_TABLET | ORAL | 1 refills | Status: DC

## 2016-02-04 MED ORDER — BLOOD GLUCOSE MONITOR KIT: PACK | 0 refills | Status: DC

## 2016-02-04 MED ORDER — TRAMADOL HCL 50 MG PO TABS: 100.0000 mg | ORAL_TABLET | Freq: Four times a day (QID) | ORAL | 5 refills | Status: DC | PRN

## 2016-02-04 MED ORDER — GLIMEPIRIDE 4 MG PO TABS: 4.0000 mg | ORAL_TABLET | Freq: Two times a day (BID) | ORAL | 1 refills | Status: DC

## 2016-02-04 NOTE — Patient Instructions (Signed)
     IF you received an x-ray today, you will receive an invoice from [facility]. Please contact [facility] at [phone] with questions or concerns regarding your invoice.   IF you received labwork today, you will receive an invoice from [facility]. Please contact [facility] at [phone] with questions or concerns regarding your invoice.   Our billing staff will not be able to assist you with questions regarding bills from these companies.  You will be contacted with the lab results as soon as they are available. The fastest way to get your results is to activate your My Chart account. Instructions are located on the last page of this paperwork. If you have not heard from us regarding the results in 2 weeks, please contact this office.      

## 2016-02-05 ENCOUNTER — Ambulatory Visit (HOSPITAL_COMMUNITY)
Admission: RE | Admit: 2016-02-05 | Discharge: 2016-02-05 | Disposition: A | Discharge status: Home / Self Care | Payer: PPO | Source: Ambulatory Visit | Attending: Cardiology | Admitting: Cardiology

## 2016-02-05 ENCOUNTER — Encounter (HOSPITAL_COMMUNITY): Payer: Self-pay

## 2016-02-05 VITALS — BP 98/61 | HR 81 | Ht 66.0 in | Wt 203.1 lb

## 2016-02-05 DIAGNOSIS — Z86711 Personal history of pulmonary embolism: Secondary | ICD-10-CM | POA: Insufficient documentation

## 2016-02-05 DIAGNOSIS — I130 Hypertensive heart and chronic kidney disease with heart failure and stage 1 through stage 4 chronic kidney disease, or unspecified chronic kidney disease: Secondary | ICD-10-CM | POA: Insufficient documentation

## 2016-02-05 DIAGNOSIS — G894 Chronic pain syndrome: Secondary | ICD-10-CM | POA: Diagnosis not present

## 2016-02-05 DIAGNOSIS — J449 Chronic obstructive pulmonary disease, unspecified: Secondary | ICD-10-CM | POA: Insufficient documentation

## 2016-02-05 DIAGNOSIS — I5041 Acute combined systolic (congestive) and diastolic (congestive) heart failure: Secondary | ICD-10-CM

## 2016-02-05 DIAGNOSIS — N183 Chronic kidney disease, stage 3 unspecified: Secondary | ICD-10-CM

## 2016-02-05 DIAGNOSIS — R0609 Other forms of dyspnea: Secondary | ICD-10-CM | POA: Insufficient documentation

## 2016-02-05 DIAGNOSIS — Z7901 Long term (current) use of anticoagulants: Secondary | ICD-10-CM | POA: Insufficient documentation

## 2016-02-05 DIAGNOSIS — R0602 Shortness of breath: Secondary | ICD-10-CM | POA: Diagnosis not present

## 2016-02-05 DIAGNOSIS — I429 Cardiomyopathy, unspecified: Secondary | ICD-10-CM | POA: Insufficient documentation

## 2016-02-05 DIAGNOSIS — E875 Hyperkalemia: Secondary | ICD-10-CM | POA: Insufficient documentation

## 2016-02-05 DIAGNOSIS — R42 Dizziness and giddiness: Secondary | ICD-10-CM | POA: Diagnosis not present

## 2016-02-05 DIAGNOSIS — E785 Hyperlipidemia, unspecified: Secondary | ICD-10-CM | POA: Insufficient documentation

## 2016-02-05 DIAGNOSIS — I252 Old myocardial infarction: Secondary | ICD-10-CM | POA: Insufficient documentation

## 2016-02-05 DIAGNOSIS — E118 Type 2 diabetes mellitus with unspecified complications: Secondary | ICD-10-CM | POA: Diagnosis not present

## 2016-02-05 DIAGNOSIS — F17210 Nicotine dependence, cigarettes, uncomplicated: Secondary | ICD-10-CM | POA: Insufficient documentation

## 2016-02-05 DIAGNOSIS — Z79899 Other long term (current) drug therapy: Secondary | ICD-10-CM | POA: Insufficient documentation

## 2016-02-05 DIAGNOSIS — I5022 Chronic systolic (congestive) heart failure: Secondary | ICD-10-CM | POA: Diagnosis not present

## 2016-02-05 LAB — BASIC METABOLIC PANEL WITH GFR
BUN: 23 mg/dL (ref 7–25)
CHLORIDE: 95 mmol/L — AB (ref 98–110)
CO2: 24 mmol/L (ref 20–31)
Calcium: 9.5 mg/dL (ref 8.6–10.4)
Creat: 1.49 mg/dL — ABNORMAL HIGH (ref 0.50–1.05)
GFR, EST AFRICAN AMERICAN: 44 mL/min — AB (ref >=60)
GFR, EST NON AFRICAN AMERICAN: 38 mL/min — AB (ref >=60)
Glucose, Bld: 218 mg/dL — ABNORMAL HIGH (ref 65–99)
POTASSIUM: 5.2 mmol/L (ref 3.5–5.3)
Sodium: 132 mmol/L — ABNORMAL LOW (ref 135–146)

## 2016-02-05 LAB — LIPID PANEL
CHOLESTEROL: 182 mg/dL (ref 125–200)
HDL: 32 mg/dL — ABNORMAL LOW (ref >=46)
LDL CALC: 111 mg/dL (ref <130)
TRIGLYCERIDES: 195 mg/dL — AB (ref <150)
Total CHOL/HDL Ratio: 5.7 Ratio — ABNORMAL HIGH (ref <=5.0)
VLDL: 39 mg/dL — ABNORMAL HIGH (ref <30)

## 2016-02-05 LAB — DIGOXIN LEVEL: Digoxin Level: 0.7 ng/mL — ABNORMAL LOW (ref 0.8–2.0)

## 2016-02-05 LAB — CBC
HEMATOCRIT: 38.6 % (ref 36.0–46.0)
HEMOGLOBIN: 11.8 g/dL — AB (ref 12.0–15.0)
MCH: 24.1 pg — AB (ref 26.0–34.0)
MCHC: 30.6 g/dL (ref 30.0–36.0)
MCV: 78.9 fL (ref 78.0–100.0)
Platelets: 255 10*3/uL (ref 150–400)
RBC: 4.89 MIL/uL (ref 3.87–5.11)
RDW: 20.7 % — ABNORMAL HIGH (ref 11.5–15.5)
WBC: 7.6 10*3/uL (ref 4.0–10.5)

## 2016-02-05 LAB — BRAIN NATRIURETIC PEPTIDE: B NATRIURETIC PEPTIDE 5: 244.6 pg/mL — AB (ref 0.0–100.0)

## 2016-02-05 LAB — HEMOGLOBIN A1C
HEMOGLOBIN A1C: 7.5 % — AB (ref <5.7)
Mean Plasma Glucose: 169 mg/dL

## 2016-02-05 MED ORDER — RIVAROXABAN 20 MG PO TABS: 20.0000 mg | ORAL_TABLET | Freq: Every day | ORAL | Status: DC

## 2016-02-05 MED ORDER — ATORVASTATIN CALCIUM 40 MG PO TABS: 40.0000 mg | ORAL_TABLET | Freq: Every day | ORAL | 3 refills | Status: DC

## 2016-02-05 MED ORDER — ATORVASTATIN CALCIUM 40 MG PO TABS: 40.0000 mg | ORAL_TABLET | Freq: Every day | ORAL | 0 refills | Status: DC

## 2016-02-05 NOTE — Patient Instructions (Signed)
Stop Lovastatin  Start Atorvastatin 40 mg daily  Labs today  Labs in 1 week  We have provided you samples of Entresto and Xarelto, our pharmacist Cicero Duck will call you next week to help you with patient assistance   You have been referred to Cardiac Rehab, they will call you to schedule  Your physician recommends that you schedule a follow-up appointment in: 1 month

## 2016-02-05 NOTE — Progress Notes (Signed)
Medication Samples have been provided to the patient.  Drug name: Xarelto       Strength: 20mg         Qty: 3  LOT: [id number]  Exp.Date: 11/19  Dosing instructions: 1 tab daily  The patient has been instructed regarding the correct time, dose, and frequency of taking this medication, including desired effects and most common side effects.   [name] 4:33 PM 02/05/2016  Medication Samples have been provided to the patient.  Drug name: Sherryll Burger       Strength: 24/26 mg        Qty: 3  LOT: [id number]  Exp.Date: 1/18 and 10/17  Dosing instructions: 1 tab Twice daily   The patient has been instructed regarding the correct time, dose, and frequency of taking this medication, including desired effects and most common side effects.   [name] 4:33 PM 02/05/2016

## 2016-02-07 NOTE — Progress Notes (Signed)
PCP: Dr Delman Cheadle  Primary HF Cardiologist: Dr Aundra Dubin  HPI: Kaitlin Daddario McClintockis a 60 y.o.female with history of COPD, tobacco abuse, DM, HTN, arthritis, and chronic pain syndrome who presented to [facility] with SOB in 8/17.  She had had several months of increasing exertional dyspnea prior to this.  CT angio 12/29/15 showed submassive PE with concerns for right heart strain. Now on Xarelto.  Echo 12/30/15 showed LVEF 20-25%, Grade 3 DD, Mod MR, mod LAE, Mild RV dilation with mildly reduced systolic function, Moderate RAE, Moderate TR. Peak PA pressure 54 mm Hg.  Cardiac MRI (9/17) showed EF 15%, moderate LV dilation, mild RV dilation/moderately decreased RV systolic function, no LGE.  She was diuresed in the hospital and begun on Xarelto.  Smokes 1 ppd (x 42 years). No ETOH or drug use. No history of cancer or recent surgery, no long car/plane trips prior to recent PE.   I stopped her spironolactone due to persistent hyperkalemia.  She is out of Xarelto and is using her mother's Xarelto 15 mg daily.  She just ran out of Delene Loll also and says she cannot afford it.  It was recommended that she start atorvastatin instead of lovastatin, but she never did this.   Currently stable.  SBP in 90s-100s generally.  Occasional lightheadedness (rare).  No BRBPR or melena.  No palpitations.  She has not started Chantix yet and still smokes.  No dyspnea walking on flat ground.  No orthopnea/PND.  No chest pain.   Weight is stable.   PMH: 1. PE: 12/2015 CT angio 12/29/15 submassive PE with concerns for right heart strain. Now on Xarelto.  2. Cardiomyopathy:  Found 8/17.  - CMRI 01/01/2016: EF 15%, moderate LV dilation, mild RV dilation/moderately decreased RV systolic function.  No LGE: no infiltrative or inflammatory process noted, no evidence for prior MI. - ECHO  12/30/2015: EF 20-25%. Grade III DD, Mod TR, PASP 54, mildly decreased RV systolic function.  - Hyperkalemia with spironolactone.  3. ABIs 01/05/2016 normal    4. COPD: Active smoker.  5. Type II diabetes 6. HTN 7. Chronic pain syndrome  Labs (9/17): SPEP negative, HIV negative, K 4.4, creatinine 1.22 Labs (10/17): LDL 111, HDL 32, TSH normal, K 5.2, creatinine 1.49  ROS: All systems negative except as listed in HPI, PMH and Problem List.  SH:  Social History   Social History  . Marital status: Married    Spouse name: N/A  . Number of children: 0  . Years of education: N/A   Occupational History  . retired    Social History Main Topics  . Smoking status: Current Every Day Smoker    Packs/day: 1.00    Years: 40.00    Types: Cigarettes  . Smokeless tobacco: Never Used  . Alcohol use No  . Drug use: No  . Sexual activity: Not on file   Other Topics Concern  . Not on file   Social History Narrative   Exercise walking daily (varies)    FH:  Family History  Problem Relation Age of Onset  . Colon polyps Father   . Diabetes Father     mother  . Heart disease Father   . Heart murmur Mother   . Diabetes Mother   . Hypertension Mother   . Diabetes Brother   . Cancer Maternal Grandmother   . Diabetes Paternal Grandmother   . Heart disease Paternal Grandmother   . COPD Paternal Grandfather   . Heart disease Paternal Grandfather   .  Hypertension Brother   . Hypertension Brother     Current Outpatient Prescriptions  Medication Sig Dispense Refill  . azelastine (OPTIVAR) 0.05 % ophthalmic solution Place 1 drop into both eyes 2 (two) times daily. 6 mL 12  . blood glucose meter kit and supplies KIT Dispense based on patient and insurance preference. Use up to four times daily as directed. (FOR ICD-9 250.00, 250.01). 1 each 0  . budesonide-formoterol (SYMBICORT) 160-4.5 MCG/ACT inhaler Inhale 1 puff into the lungs 2 (two) times daily. 1 Inhaler 12  . carvedilol (COREG) 3.125 MG tablet Take 1 tablet (3.125 mg total) by mouth 2 (two) times daily with a meal. 60 tablet 1  . cyclobenzaprine (FLEXERIL) 10 MG tablet TAKE ONE TABLET  BY MOUTH THREE TIMES DAILY AS NEEDED FOR MUSCLE SPASMS 270 tablet 1  . diazepam (VALIUM) 5 MG tablet TAKE ONE TABLET BY MOUTH AT BEDTIME AND  MAY  REPEAT  DOSE  ONE  TIME  IF  NEEDED 180 tablet 1  . digoxin (LANOXIN) 0.125 MG tablet Take 1 tablet (0.125 mg total) by mouth daily. 30 tablet 3  . furosemide (LASIX) 40 MG tablet Take 1 tablet (40 mg total) by mouth daily. (Patient taking differently: Take 40 mg by mouth every other day. ) 30 tablet 1  . gabapentin (NEURONTIN) 300 MG capsule Take 1 capsule (300 mg total) by mouth 3 (three) times daily. Titrate up slowely 90 capsule 3  . glimepiride (AMARYL) 4 MG tablet Take 1 tablet (4 mg total) by mouth 2 (two) times daily. 180 tablet 1  . HYDROcodone-acetaminophen (NORCO) 7.5-325 MG tablet Take 1 tablet by mouth every 6 (six) hours as needed for moderate pain. 90 tablet 0  . ipratropium (ATROVENT HFA) 17 MCG/ACT inhaler Inhale 2 puffs into the lungs 4 (four) times daily. 1 Inhaler 12  . Krill Oil CAPS Take 1 capsule by mouth daily.     Marland Kitchen omeprazole (PRILOSEC) 20 MG capsule Take 1 capsule (20 mg total) by mouth daily. 1 capsule 0  . ranitidine (ZANTAC) 150 MG tablet TAKE ONE TABLET BY MOUTH TWICE DAILY. 180 tablet 0  . Rivaroxaban (XARELTO) 20 MG TABS tablet Take 1 tablet (20 mg total) by mouth daily.    . sacubitril-valsartan (ENTRESTO) 24-26 MG Take 1 tablet by mouth 2 (two) times daily. 60 tablet 1  . traMADol (ULTRAM) 50 MG tablet Take 2 tablets (100 mg total) by mouth every 6 (six) hours as needed. 240 tablet 5  . atorvastatin (LIPITOR) 40 MG tablet Take 1 tablet (40 mg total) by mouth daily. 30 tablet 3  . varenicline (CHANTIX STARTING MONTH PAK) 0.5 MG X 11 & 1 MG X 42 tablet Take one 0.5 mg tablet by mouth once daily for 3 days, then increase to one 0.5 mg tablet twice daily for 4 days, then increase to one 1 mg tablet twice daily. (Patient not taking: Reported on 02/05/2016) 53 tablet 0   No current facility-administered medications for this  encounter.     Vitals:   02/05/16 1553  BP: 98/61  Pulse: 81  Weight: 203 lb 1.9 oz (92.1 kg)  Height: '5\' 6"'  (1.676 m)    PHYSICAL EXAM:  General:  NAD HEENT: normal Neck: supple. JVP flat. Carotids 2+ bilaterally; no bruits. No lymphadenopathy or thryomegaly appreciated. Cor: PMI normal. Regular rate & rhythm. No rubs, gallops or murmurs. Lungs: clear Abdomen: soft, nontender, nondistended. No hepatosplenomegaly. No bruits or masses. Good bowel sounds. Extremities: no cyanosis, clubbing, rash, edema.  R and LLE varicosities Neuro: alert & orientedx3, cranial nerves grossly intact. Moves all 4 extremities w/o difficulty. Affect pleasant.  ASSESSMENT & PLAN: 1. H/O PE: Submassive on CT.  Etiology uncertain: no known cancer, long trip, recent surgery.  She is fairly active.  Possibly related to stasis in the setting of cardiomyopathy with severely decreased systolic function.  - Given cardiomyopathy and submassive PE with no certain trigger, think she will need long-term anticoagulation.  She is on Xarelto but is currently talking a family member's Xarelto 15 mg daily as she is not able to pay for Xarelto.  I will give her samples of 20 mg Xarelto and will have our pharmacist look into getting her patient assistance.  2. Chronic systolic CHF: ECHO 06/9028 EF 15-20% with diffuse hypokinesis.  Uncertain etiology.  No chest pain. Symptoms began as orthopnea around 6/17, then developed progressive exertional dyspnea. ECG shows anteroseptal MI but no change from 2013 ECG. Cardiac MRI did not show LGE. HIV negative, thyroid indices ok (mild TSH elevation but normal T3/T4), SPEP negative.  NYHA class II-III symptoms with fatigue most prominent.  Not volume overloaded on exam.  - Ultimately, needs right and left heart cath for CAD evaluation.  However, given absence of chest pain/ACS and acute admission with submassive PE, would avoid holding anticoagulation for cath for 3 months (end of November).     - Continue carvedilol 3.125 mg twice daily. Will not increase with fatigue.  - Continue digoxin 0.125 mg daily, check level today.  - Continue Entresto 24/26 bid, will give samples then work with our pharmacist to get her patient assistance.  - Continue spironolactone 25 mg daily.  - Continue Lasix 40 daily.  - BMET today.  - Repeat echo in 6 months to look for recovery.  QRS not wide so would not be CRT candidate.  - Will refer to cardiac rehab.  3. CKD: Stage III.    4. Active smoker: Counseled to quit. She has a prescription for Chantix.  5. Hyperlipidemia: LDL high on lovastatin.  I will have her stop lovastatin and start atorvastatin 40 mg daily with lipids/LFTs in 2 months.  Follow up in 1 month  Loralie Champagne 02/07/2016

## 2016-02-08 ENCOUNTER — Telehealth (HOSPITAL_COMMUNITY): Payer: Self-pay | Admitting: Pharmacist

## 2016-02-08 NOTE — Telephone Encounter (Signed)
Patient called stating she needs assistance with the cost of Xarelto and Entresto. Since she has Medicare Part D, I can enroll her in [employer] foundation for Entresto coverage  and we can apply for [employer] patient assistance for Xarelto (needs to have spent 3% of her annual income on Rx's for the year). I have called and left a VM to call back with info needed.   Tyler Deis. Bonnye Fava, PharmD, BCPS, CPP Clinical Pharmacist Pager: [phone] Phone: [phone] 02/08/2016 12:28 PM

## 2016-02-13 ENCOUNTER — Other Ambulatory Visit: Payer: Self-pay | Admitting: Family Medicine

## 2016-02-15 ENCOUNTER — Ambulatory Visit (HOSPITAL_COMMUNITY)
Admission: RE | Admit: 2016-02-15 | Discharge: 2016-02-15 | Disposition: A | Discharge status: Home / Self Care | Payer: PPO | Source: Ambulatory Visit | Attending: Internal Medicine | Admitting: Internal Medicine

## 2016-02-15 DIAGNOSIS — I5041 Acute combined systolic (congestive) and diastolic (congestive) heart failure: Secondary | ICD-10-CM | POA: Diagnosis not present

## 2016-02-15 LAB — BASIC METABOLIC PANEL
Anion gap: 10 (ref 5–15)
BUN: 16 mg/dL (ref 6–20)
CHLORIDE: 94 mmol/L — AB (ref 101–111)
CO2: 30 mmol/L (ref 22–32)
CREATININE: 1.37 mg/dL — AB (ref 0.44–1.00)
Calcium: 9.4 mg/dL (ref 8.9–10.3)
GFR calc Af Amer: 48 mL/min — ABNORMAL LOW (ref >60)
GFR calc non Af Amer: 42 mL/min — ABNORMAL LOW (ref >60)
GLUCOSE: 177 mg/dL — AB (ref 65–99)
POTASSIUM: 4.5 mmol/L (ref 3.5–5.1)
SODIUM: 134 mmol/L — AB (ref 135–145)

## 2016-02-23 ENCOUNTER — Telehealth (HOSPITAL_COMMUNITY): Payer: Self-pay | Admitting: Pharmacist

## 2016-02-23 MED ORDER — SACUBITRIL-VALSARTAN 24-26 MG PO TABS: 1.0000 | ORAL_TABLET | Freq: Two times a day (BID) | ORAL | 11 refills | Status: DC

## 2016-02-23 NOTE — Telephone Encounter (Addendum)
Have enrolled Kaitlin Cantrell in [employer] foundation so that she will have $800 toward her copay costs for Entresto through 02/21/2017. Info relayed to [employer] on [address] and they verified $0 copay.   Billing ID: [id number] Person Code: [id number] RX Group: [id number] RX BIN: [id number] PCN for Part D: [id number]    Tyler Deis. Bonnye Fava, PharmD, BCPS, CPP Clinical Pharmacist Pager: [phone] Phone: [phone] 02/23/2016 11:00 AM

## 2016-02-24 ENCOUNTER — Telehealth (HOSPITAL_COMMUNITY): Payer: Self-pay | Admitting: Pharmacist

## 2016-02-24 NOTE — Telephone Encounter (Signed)
Entresto 24-26 mg BID PA approved by [employer] through 05/01/17.   Tyler Deis. Bonnye Fava, PharmD, BCPS, CPP Clinical Pharmacist Pager: [phone] Phone: [phone] 02/24/2016 4:43 PM

## 2016-02-29 ENCOUNTER — Other Ambulatory Visit: Payer: Self-pay | Admitting: Internal Medicine

## 2016-03-07 ENCOUNTER — Encounter (HOSPITAL_COMMUNITY): Payer: Self-pay | Admitting: Cardiology

## 2016-03-07 ENCOUNTER — Ambulatory Visit (HOSPITAL_COMMUNITY)
Admission: RE | Admit: 2016-03-07 | Discharge: 2016-03-07 | Disposition: A | Discharge status: Home / Self Care | Payer: PPO | Source: Ambulatory Visit | Attending: Internal Medicine | Admitting: Internal Medicine

## 2016-03-07 VITALS — BP 164/92 | HR 94 | Wt 203.4 lb

## 2016-03-07 DIAGNOSIS — Z79899 Other long term (current) drug therapy: Secondary | ICD-10-CM | POA: Insufficient documentation

## 2016-03-07 DIAGNOSIS — Z7901 Long term (current) use of anticoagulants: Secondary | ICD-10-CM | POA: Insufficient documentation

## 2016-03-07 DIAGNOSIS — I130 Hypertensive heart and chronic kidney disease with heart failure and stage 1 through stage 4 chronic kidney disease, or unspecified chronic kidney disease: Secondary | ICD-10-CM | POA: Diagnosis not present

## 2016-03-07 DIAGNOSIS — J449 Chronic obstructive pulmonary disease, unspecified: Secondary | ICD-10-CM | POA: Insufficient documentation

## 2016-03-07 DIAGNOSIS — Z86711 Personal history of pulmonary embolism: Secondary | ICD-10-CM | POA: Diagnosis not present

## 2016-03-07 DIAGNOSIS — E875 Hyperkalemia: Secondary | ICD-10-CM | POA: Insufficient documentation

## 2016-03-07 DIAGNOSIS — F17210 Nicotine dependence, cigarettes, uncomplicated: Secondary | ICD-10-CM | POA: Insufficient documentation

## 2016-03-07 DIAGNOSIS — E785 Hyperlipidemia, unspecified: Secondary | ICD-10-CM | POA: Diagnosis not present

## 2016-03-07 DIAGNOSIS — I252 Old myocardial infarction: Secondary | ICD-10-CM | POA: Diagnosis not present

## 2016-03-07 DIAGNOSIS — N183 Chronic kidney disease, stage 3 unspecified: Secondary | ICD-10-CM

## 2016-03-07 DIAGNOSIS — I429 Cardiomyopathy, unspecified: Secondary | ICD-10-CM | POA: Diagnosis not present

## 2016-03-07 DIAGNOSIS — I5022 Chronic systolic (congestive) heart failure: Secondary | ICD-10-CM | POA: Diagnosis not present

## 2016-03-07 DIAGNOSIS — Z720 Tobacco use: Secondary | ICD-10-CM

## 2016-03-07 MED ORDER — RIVAROXABAN 20 MG PO TABS: 20.0000 mg | ORAL_TABLET | Freq: Two times a day (BID) | ORAL | Status: DC

## 2016-03-07 MED ORDER — CARVEDILOL 6.25 MG PO TABS: 6.2500 mg | ORAL_TABLET | Freq: Two times a day (BID) | ORAL | 3 refills | Status: DC

## 2016-03-07 MED ORDER — FUROSEMIDE 40 MG PO TABS: 40.0000 mg | ORAL_TABLET | ORAL | 3 refills | Status: DC

## 2016-03-07 MED ORDER — DIGOXIN 125 MCG PO TABS: 0.1250 mg | ORAL_TABLET | Freq: Every day | ORAL | 3 refills | Status: DC

## 2016-03-07 MED ORDER — RIVAROXABAN 20 MG PO TABS: 20.0000 mg | ORAL_TABLET | Freq: Every day | ORAL | Status: DC

## 2016-03-07 NOTE — Addendum Note (Signed)
Encounter addended by: Theresia Bough, CMA on: 03/07/2016  4:27 PM<BR>    Actions taken: Sign clinical note

## 2016-03-07 NOTE — Progress Notes (Signed)
CSW referred to assist patient with coping due to multiple stressors. CSW met with patient in the clinic who shared multiple life events over the past year. Patient reports she her husband was diagnosed with colon cancer, mother passed away, her 58yo family dog died in 20-Dec-2022 and she has been diagnosed with HF. Patient spoke about how she has been the caregiver for so many along the way and has never been the "sick one". Patient shared recently she and her husband went to the beach to "get away and reenergize". Patient's mother in law was admitted to hospice yesterday which was the trigger for her today. Patient states she feels safe to release her emotions and "get it all out while here in the MD office". CSW listened and provided supportive counseling and coping skills to assist at home. Patient very grateful and will return if needed. CSW available as needed. Raquel Sarna, LCSW [phone]

## 2016-03-07 NOTE — Patient Instructions (Addendum)
RESTART Xarelto 20 mg, daily INCREASE Coreg to 6.25 mg, one tab twice a day  Your physician has requested that you have a cardiac catheterization. Cardiac catheterization is used to diagnose and/or treat various heart conditions. Doctors may recommend this procedure for a number of different reasons. The most common reason is to evaluate chest pain. Chest pain can be a symptom of coronary artery disease (CAD), and cardiac catheterization can show whether plaque is narrowing or blocking your heart's arteries. This procedure is also used to evaluate the valves, as well as measure the blood flow and oxygen levels in different parts of your heart. For further information please visit [email or web address] Please follow instruction sheet, as given.  Your physician recommends that you schedule a follow-up appointment in: 6 weeks with Tonye Becket, NP

## 2016-03-07 NOTE — Progress Notes (Signed)
PCP: Dr Delman Cheadle  Primary HF Cardiologist: Dr Aundra Dubin  HPI: Kaitlin Tessmer McClintockis a 60 y.o.female with history of COPD, tobacco abuse, DM, HTN, arthritis, and chronic pain syndrome who presented to [facility] with SOB in 8/17.  She had had several months of increasing exertional dyspnea prior to this.  CT angio 12/29/15 showed submassive PE with concerns for right heart strain. Now on Xarelto.  Echo 12/30/15 showed LVEF 20-25%, Grade 3 DD, Mod MR, mod LAE, Mild RV dilation with mildly reduced systolic function, Moderate RAE, Moderate TR. Peak PA pressure 54 mm Hg.  Cardiac MRI (9/17) showed EF 15%, moderate LV dilation, mild RV dilation/moderately decreased RV systolic function, no LGE.  She was diuresed in the hospital and begun on Xarelto.  Smokes 1 ppd (x 42 years). No ETOH or drug use. No history of cancer or recent surgery, no long car/plane trips prior to recent PE.   She returns to the HF clinic. Overall feeling much better. Denies SOB. Does admit to dyspnea with inclines. Denies PND/CP. Has not been able to afford xarelto. Having a hard time dealing with illness and caring for her family. Says she has been taking a family members. She has been 1 1/2 tablets of Xarelto. No bleeding problems. No CP. Weight at home 202-205 pounds. Smoking 1/2 PPD.    PMH: 1. PE: 12/2015 CT angio 12/29/15 submassive PE with concerns for right heart strain. Now on Xarelto.  2. Cardiomyopathy:  Found 8/17.  - CMRI 01/01/2016: EF 15%, moderate LV dilation, mild RV dilation/moderately decreased RV systolic function.  No LGE: no infiltrative or inflammatory process noted, no evidence for prior MI. - ECHO  12/30/2015: EF 20-25%. Grade III DD, Mod TR, PASP 54, mildly decreased RV systolic function.  - Hyperkalemia with spironolactone.  3. ABIs 01/05/2016 normal   4. COPD: Active smoker.  5. Type II diabetes 6. HTN 7. Chronic pain syndrome  Labs (9/17): SPEP negative, HIV negative, K 4.4, creatinine 1.22 Labs (10/17): LDL 111,  HDL 32, TSH normal, K 5.2, creatinine 1.49 Labs 02/15/2016: K 4.5 Creatinine 1.37   ROS: All systems negative except as listed in HPI, PMH and Problem List.  SH:  Social History   Social History  . Marital status: Married    Spouse name: N/A  . Number of children: 0  . Years of education: N/A   Occupational History  . retired    Social History Main Topics  . Smoking status: Current Every Day Smoker    Packs/day: 1.00    Years: 40.00    Types: Cigarettes  . Smokeless tobacco: Never Used  . Alcohol use No  . Drug use: No  . Sexual activity: Not on file   Other Topics Concern  . Not on file   Social History Narrative   Exercise walking daily (varies)    FH:  Family History  Problem Relation Age of Onset  . Colon polyps Father   . Diabetes Father     mother  . Heart disease Father   . Heart murmur Mother   . Diabetes Mother   . Hypertension Mother   . Diabetes Brother   . Cancer Maternal Grandmother   . Diabetes Paternal Grandmother   . Heart disease Paternal Grandmother   . COPD Paternal Grandfather   . Heart disease Paternal Grandfather   . Hypertension Brother   . Hypertension Brother     Current Outpatient Prescriptions  Medication Sig Dispense Refill  . atorvastatin (LIPITOR) 40 MG  tablet Take 1 tablet (40 mg total) by mouth daily. 30 tablet 3  . blood glucose meter kit and supplies KIT Dispense based on patient and insurance preference. Use up to four times daily as directed. (FOR ICD-9 250.00, 250.01). 1 each 0  . budesonide-formoterol (SYMBICORT) 160-4.5 MCG/ACT inhaler Inhale 1 puff into the lungs 2 (two) times daily. 1 Inhaler 12  . carvedilol (COREG) 3.125 MG tablet Take 1 tablet (3.125 mg total) by mouth 2 (two) times daily with a meal. 60 tablet 1  . cyclobenzaprine (FLEXERIL) 10 MG tablet TAKE ONE TABLET BY MOUTH THREE TIMES DAILY AS NEEDED FOR MUSCLE SPASMS 270 tablet 1  . diazepam (VALIUM) 5 MG tablet TAKE ONE TABLET BY MOUTH AT BEDTIME AND   MAY  REPEAT  DOSE  ONE  TIME  IF  NEEDED 180 tablet 1  . digoxin (LANOXIN) 0.125 MG tablet Take 1 tablet (0.125 mg total) by mouth daily. 30 tablet 3  . furosemide (LASIX) 40 MG tablet Take 1 tablet (40 mg total) by mouth daily. (Patient taking differently: Take 40 mg by mouth every other day. ) 30 tablet 1  . gabapentin (NEURONTIN) 300 MG capsule TAKE ONE CAPSULE BY MOUTH THREE TIMES DAILY. TITRATE UP SLOWLY. 90 capsule 0  . glimepiride (AMARYL) 4 MG tablet Take 1 tablet (4 mg total) by mouth 2 (two) times daily. 180 tablet 1  . HYDROcodone-acetaminophen (NORCO) 7.5-325 MG tablet Take 1 tablet by mouth every 6 (six) hours as needed for moderate pain. 90 tablet 0  . ipratropium (ATROVENT HFA) 17 MCG/ACT inhaler Inhale 2 puffs into the lungs 4 (four) times daily. 1 Inhaler 12  . Krill Oil CAPS Take 1 capsule by mouth daily.     Marland Kitchen omeprazole (PRILOSEC) 20 MG capsule Take 1 capsule (20 mg total) by mouth daily. 1 capsule 0  . ranitidine (ZANTAC) 150 MG tablet TAKE ONE TABLET BY MOUTH TWICE DAILY. 180 tablet 0  . Rivaroxaban (XARELTO) 20 MG TABS tablet Take 1 tablet (20 mg total) by mouth daily.    . sacubitril-valsartan (ENTRESTO) 24-26 MG Take 1 tablet by mouth 2 (two) times daily. 60 tablet 11  . traMADol (ULTRAM) 50 MG tablet Take 2 tablets (100 mg total) by mouth every 6 (six) hours as needed. 240 tablet 5  . varenicline (CHANTIX STARTING MONTH PAK) 0.5 MG X 11 & 1 MG X 42 tablet Take one 0.5 mg tablet by mouth once daily for 3 days, then increase to one 0.5 mg tablet twice daily for 4 days, then increase to one 1 mg tablet twice daily. (Patient not taking: Reported on 02/05/2016) 53 tablet 0   No current facility-administered medications for this encounter.     Vitals:   03/07/16 1420  BP: (!) 164/92  Pulse: 94  SpO2: 97%  Weight: 203 lb 6.4 oz (92.3 kg)    PHYSICAL EXAM: General:  NAD. Ambulated in the clinic without difficuty HEENT: normal Neck: supple. JVP flat. Carotids 2+  bilaterally; no bruits. No lymphadenopathy or thryomegaly appreciated. Cor: PMI normal. Regular rate & rhythm. No rubs, gallops or murmurs. Lungs: clear Abdomen: soft, nontender, nondistended. No hepatosplenomegaly. No bruits or masses. Good bowel sounds. Extremities: no cyanosis, clubbing, rash, edema. R and LLE varicosities Neuro: alert & orientedx3, cranial nerves grossly intact. Moves all 4 extremities w/o difficulty. Affect pleasant.  ASSESSMENT & PLAN: 1. H/O PE 12/2015: Submassive on CT.  Etiology uncertain: no known cancer, long trip, recent surgery.  She is fairly active.  Possibly related to stasis in the setting of cardiomyopathy with severely decreased systolic function.  - Given cardiomyopathy and submassive PE with no certain trigger, think she will need long-term anticoagulation.  Can not pay for Xarelto so she has been taking family members Xarelto.  Today she is being provided with samples. Continue Xarelto 20 mg daily  2. Chronic systolic CHF: ECHO 07/7046 EF 15-20% with diffuse hypokinesis.  Uncertain etiology.  No chest pain. Symptoms began as orthopnea around 6/17, then developed progressive exertional dyspnea. ECG shows anteroseptal MI but no change from 2013 ECG. Cardiac MRI did not show LGE. HIV negative, thyroid indices ok (mild TSH elevation but normal T3/T4), SPEP negative.   NYHA class II-III symptoms with fatigue most prominent.  Not volume overloaded on exam.  -Nneeds right and left heart cath for CAD evaluation.  In December it will be over 3 months since PEs. Hold xarelto the day before cath. She was set up for RHC/LHC for the first week of December.  - Increase carvedilol to 6.25 mg 3.125 mg twice daily.  - Continue digoxin 0.125 mg daily, check level today.  - Continue Entresto 24/26 bid. Received approval for [employer] for [employer].   - Continue Lasix 40 every other day   - Repeat echo in 6 months to look for recovery.  QRS not wide so would not be CRT  candidate.  Will make sure she has referral to cardiac rehab.  3. CKD: Stage III.    4. Active smoker: Counseled to quit. She has a prescription for Chantix. Says she is not ready to stop.  5. Hyperlipidemia: LDL high on lovastatin.  Continue  atorvastatin 40 mg daily. Check  lipids/LFTs in 4 weeks.  6. Hyperkalemia- not taking spiro due to hyperkalemia   Follow up the first week of December for RHC/LHC then 2 weeks after with Dr Aundra Dubin. Referred to [place] for coping mechansims. Warren Lacy Tyrica Afzal NP-C  03/07/2016

## 2016-03-07 NOTE — Progress Notes (Signed)
Medication Samples have been provided to the patient.  Drug name: xarelto       Strength: 20mg         Qty: 28  LOT: [id number]  Exp.Date: 06/2018  Dosing instructions: one daily  The patient has been instructed regarding the correct time, dose, and frequency of taking this medication, including desired effects and most common side effects.   Theresia Bough 4:23 PM 03/07/2016

## 2016-03-09 ENCOUNTER — Other Ambulatory Visit (HOSPITAL_COMMUNITY): Payer: Self-pay

## 2016-03-09 DIAGNOSIS — R0600 Dyspnea, unspecified: Secondary | ICD-10-CM

## 2016-03-10 ENCOUNTER — Encounter: Payer: Self-pay | Admitting: Dietician

## 2016-03-10 ENCOUNTER — Encounter: Payer: PPO | Attending: Family Medicine | Admitting: Dietician

## 2016-03-10 DIAGNOSIS — E119 Type 2 diabetes mellitus without complications: Secondary | ICD-10-CM

## 2016-03-10 DIAGNOSIS — I130 Hypertensive heart and chronic kidney disease with heart failure and stage 1 through stage 4 chronic kidney disease, or unspecified chronic kidney disease: Secondary | ICD-10-CM | POA: Diagnosis not present

## 2016-03-10 DIAGNOSIS — N183 Chronic kidney disease, stage 3 (moderate): Secondary | ICD-10-CM | POA: Diagnosis not present

## 2016-03-10 DIAGNOSIS — E1122 Type 2 diabetes mellitus with diabetic chronic kidney disease: Secondary | ICD-10-CM | POA: Insufficient documentation

## 2016-03-10 DIAGNOSIS — I509 Heart failure, unspecified: Secondary | ICD-10-CM | POA: Insufficient documentation

## 2016-03-10 DIAGNOSIS — E785 Hyperlipidemia, unspecified: Secondary | ICD-10-CM | POA: Diagnosis not present

## 2016-03-10 DIAGNOSIS — Z713 Dietary counseling and surveillance: Secondary | ICD-10-CM | POA: Diagnosis not present

## 2016-03-10 DIAGNOSIS — Z6832 Body mass index (BMI) 32.0-32.9, adult: Secondary | ICD-10-CM | POA: Diagnosis not present

## 2016-03-10 NOTE — Progress Notes (Signed)
Diabetes Self-Management Education  Visit Type: First/Initial  Appt. Start Time: 1300 Appt. End Time: 1430  03/10/2016  [name], identified by name and date of birth, is a 60 y.o. female with a diagnosis of Diabetes: Type 2. Other hx includes stage 3 CKD with EGFR of 42, recent PE, HERD, HTN, hyperlipidemia and CHF.  She was recently told to follow a low sodium diet and her potassium had been high as well and has had to limit potassium in her diet.  She has become frustrated with all of the restrictions and has been more free with her sugar intake.  She also reports steroids on and off since June.  Her last A1C was 7.5% 02/04/16.  She is on glimipuride currently and came off Metformin due to her Renal status.  She wants to focus more on what she can eat rather than what she cannot.  There are some diet habits (weekly regular soda, sausage biscuit) that she is unwilling to change.  Weight hx: Highest weight 282 lbs in June 20, 2004 Lowest weight 170 lbs Today 202 lbs.  She reports weight loss with hospitalization for blood clot and since returning home. She eats only 1-3 meals daily and 1-2 snacks.  Patient lives with her husband.  They share shopping and she does most of the cooking.  She has increased stress.  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  Her mother-in law is now under Hospice care.  She has not worked since a back injuring/surgery in 20-Jun-1996.    ASSESSMENT  Height _0  (1.676 m), weight 202 lb (91.6 kg). Body mass index is 32.6 kg/m.      Diabetes Self-Management Education - 03/10/16 1311      Visit Information   Visit Type First/Initial     Initial Visit   Diabetes Type Type 2   Are you currently following a meal plan? Yes   What type of meal plan do you follow? 2000 mg potassium, 2250 mg sodium daily.  "too many can'ts to avoid sugar".   Are you taking your medications as prescribed? Yes   Date Diagnosed June 20, 2006     Health Coping   How would you rate your overall health? Fair     Psychosocial Assessment   Patient Belief/Attitude about Diabetes Other (comment)  Just another thing that I deal with.   Self-care barriers None   Self-management support Doctor's office   Other persons present Patient   Patient Concerns Nutrition/Meal planning;Glycemic Control   Special Needs None   Preferred Learning Style No preference indicated   Learning Readiness Ready   How often do you need to have someone help you when you read instructions, pamphlets, or other written materials from your doctor or pharmacy? 1 - Never   What is the last grade level you completed in school? 2 years collage     Pre-Education Assessment   Patient understands the diabetes disease and treatment process. Needs Review   Patient understands incorporating nutritional management into lifestyle. Needs Review   Patient undertands incorporating physical activity into lifestyle. Needs Review   Patient understands using medications safely. Demonstrates understanding / competency   Patient understands monitoring blood glucose, interpreting and using results Needs Review   Patient understands prevention, detection, and treatment of acute complications. Needs Review   Patient understands prevention, detection, and treatment of chronic complications. Needs Review   Patient understands how to develop strategies to address psychosocial issues. Needs Review  Patient understands how to develop strategies to promote health/change behavior. Needs Review     Complications   Last HgB A1C per patient/outside source 7.5 %  02/04/16   How often do you check your blood sugar? 0 times/day (not testing)  has a machine but only checks if she does not feel "right"   Have you had a dilated eye exam in the past 12 months? Yes   Have you had a dental exam in the past 12 months? No  dentures "but dentures don't fit so does not eat with them"   Are you checking your feet? Yes    How many days per week are you checking your feet? 7     Dietary Intake   Breakfast occasionally skips.  Just grapes this morning.  Sausage biscuit or just biscuit OR eggs, 1 strip bacon, toast AND fruit  10-11 because she does not wake early   Snack (morning) none   Lunch usually skips   Snack (afternoon) occasional   Dinner meat, potatoes, bread, peas or carrots or cabbage   Snack (evening) cookies, candy, grapes, ice cream   Beverage(s) water, 1-2 regular soda per week.,occasional sweet tea     Exercise   Exercise Type Light (walking / raking leaves)  "Try to stay busy."   How many days per week to you exercise? 7   How many minutes per day do you exercise? 15   Total minutes per week of exercise 105     Patient Education   Previous Diabetes Education Yes (please comment)  "I know what I am to do but don't always do it."  "I'm a better patient than I was before."   Disease state  Other (comment)  discussed insulin resistance   Nutrition management  Role of diet in the treatment of diabetes and the relationship between the three main macronutrients and blood glucose level;Meal options for control of blood glucose level and chronic complications.;Other (comment)  review of low sodium, low phosphorous, and low potassium   Physical activity and exercise  Role of exercise on diabetes management, blood pressure control and cardiac health.  patient to discuss exercise with her MD.  She should not start anything new at this time until discussion.  She is scheduled for a heart cath.     Monitoring Identified appropriate SMBG and/or A1C goals.;Interpreting lab values - A1C, lipid, urine microalbumina.;Yearly dilated eye exam   Chronic complications Relationship between chronic complications and blood glucose control;Assessed and discussed foot care and prevention of foot problems;Retinopathy and reason for yearly dilated eye exams   Psychosocial adjustment Worked with patient to identify  barriers to care and solutions;Role of stress on diabetes   Personal strategies to promote health Review risk of smoking and offered smoking cessation     Individualized Goals (developed by patient)   Nutrition General guidelines for healthy choices and portions discussed  low sodium, low phosphorous, diabetic, moderate potassium   Physical Activity Not Applicable   Medications take my medication as prescribed   Monitoring  test my blood glucose as discussed   Reducing Risk examine blood glucose patterns;do foot checks daily;stop smoking     Post-Education Assessment   Patient understands the diabetes disease and treatment process. Demonstrates understanding / competency   Patient understands incorporating nutritional management into lifestyle. Demonstrates understanding / competency   Patient undertands incorporating physical activity into lifestyle. Demonstrates understanding / competency   Patient understands using medications safely. Demonstrates understanding / competency   Patient  understands monitoring blood glucose, interpreting and using results Demonstrates understanding / competency   Patient understands prevention, detection, and treatment of acute complications. Demonstrates understanding / competency   Patient understands prevention, detection, and treatment of chronic complications. Demonstrates understanding / competency   Patient understands how to develop strategies to address psychosocial issues. Demonstrates understanding / competency   Patient understands how to develop strategies to promote health/change behavior. Demonstrates understanding / competency     Outcomes   Future DMSE PRN   Program Status Completed      Individualized Plan for Diabetes Self-Management Training:   Learning Objective:  Patient will have a greater understanding of diabetes self-management. Patient education plan is to attend individual and/or group sessions per assessed needs and  concerns.   Plan:   Patient Instructions  Continue to follow the low sodium diet. Your potassium is fine now but still use caution with high potassium foods. Avoid dark soda.  Limit dairy to 1/2 cup per day. Choose beverages without added sugar most of the time. Continue to stay hydrated.  Limit to 8 cups of fluid daily. Consider keeping the candy out of sight.  When you are going to choose a sweet, ask, "What is an appropriate portion size?" Call to see when your eye appointment is scheduled. Be sure to eat regularly.  Avoid skipping meals. For each meal include:  1-2 ounces of protein, 2 starches, 1 fruit, and 1 vegetable with lunch and dinner.    Expected Outcomes:     Education material provided: A1C conversion sheet, Meal plan card and My Plate, Food pyramin for healthy eating with kidney disease.  If problems or questions, patient to contact team via:  Phone and Email  Future DSME appointment: PRN

## 2016-03-10 NOTE — Patient Instructions (Signed)
Continue to follow the low sodium diet. Your potassium is fine now but still use caution with high potassium foods. Avoid dark soda.  Limit dairy to 1/2 cup per day. Choose beverages without added sugar most of the time. Continue to stay hydrated.  Limit to 8 cups of fluid daily. Consider keeping the candy out of sight.  When you are going to choose a sweet, ask, "What is an appropriate portion size?" Call to see when your eye appointment is scheduled. Be sure to eat regularly.  Avoid skipping meals. For each meal include:  1-2 ounces of protein, 2 starches, 1 fruit, and 1 vegetable with lunch and dinner.

## 2016-03-14 ENCOUNTER — Other Ambulatory Visit: Payer: Self-pay | Admitting: Family Medicine

## 2016-03-14 NOTE — Telephone Encounter (Signed)
01/2016 last ov labs and refill

## 2016-03-30 DIAGNOSIS — E119 Type 2 diabetes mellitus without complications: Secondary | ICD-10-CM | POA: Diagnosis not present

## 2016-03-30 DIAGNOSIS — H2513 Age-related nuclear cataract, bilateral: Secondary | ICD-10-CM | POA: Diagnosis not present

## 2016-03-30 LAB — HM DIABETES EYE EXAM

## 2016-04-05 ENCOUNTER — Encounter (HOSPITAL_COMMUNITY)
Admission: RE | Disposition: A | Discharge status: Home / Self Care | Payer: Self-pay | Source: Ambulatory Visit | Attending: Cardiology

## 2016-04-05 ENCOUNTER — Ambulatory Visit (HOSPITAL_COMMUNITY)
Admission: RE | Admit: 2016-04-05 | Discharge: 2016-04-05 | Disposition: A | Discharge status: Home / Self Care | Payer: PPO | Source: Ambulatory Visit | Attending: Cardiology | Admitting: Cardiology

## 2016-04-05 DIAGNOSIS — I428 Other cardiomyopathies: Secondary | ICD-10-CM | POA: Insufficient documentation

## 2016-04-05 DIAGNOSIS — I429 Cardiomyopathy, unspecified: Secondary | ICD-10-CM | POA: Diagnosis not present

## 2016-04-05 DIAGNOSIS — Z86711 Personal history of pulmonary embolism: Secondary | ICD-10-CM | POA: Insufficient documentation

## 2016-04-05 DIAGNOSIS — I272 Pulmonary hypertension, unspecified: Secondary | ICD-10-CM | POA: Diagnosis not present

## 2016-04-05 DIAGNOSIS — I5022 Chronic systolic (congestive) heart failure: Secondary | ICD-10-CM | POA: Insufficient documentation

## 2016-04-05 DIAGNOSIS — Z7901 Long term (current) use of anticoagulants: Secondary | ICD-10-CM | POA: Insufficient documentation

## 2016-04-05 DIAGNOSIS — Z79899 Other long term (current) drug therapy: Secondary | ICD-10-CM | POA: Insufficient documentation

## 2016-04-05 DIAGNOSIS — Z7984 Long term (current) use of oral hypoglycemic drugs: Secondary | ICD-10-CM | POA: Insufficient documentation

## 2016-04-05 DIAGNOSIS — N183 Chronic kidney disease, stage 3 (moderate): Secondary | ICD-10-CM | POA: Insufficient documentation

## 2016-04-05 DIAGNOSIS — E875 Hyperkalemia: Secondary | ICD-10-CM | POA: Diagnosis not present

## 2016-04-05 DIAGNOSIS — Z7951 Long term (current) use of inhaled steroids: Secondary | ICD-10-CM | POA: Insufficient documentation

## 2016-04-05 DIAGNOSIS — J449 Chronic obstructive pulmonary disease, unspecified: Secondary | ICD-10-CM | POA: Insufficient documentation

## 2016-04-05 DIAGNOSIS — I252 Old myocardial infarction: Secondary | ICD-10-CM | POA: Diagnosis not present

## 2016-04-05 DIAGNOSIS — E785 Hyperlipidemia, unspecified: Secondary | ICD-10-CM | POA: Diagnosis not present

## 2016-04-05 DIAGNOSIS — I130 Hypertensive heart and chronic kidney disease with heart failure and stage 1 through stage 4 chronic kidney disease, or unspecified chronic kidney disease: Secondary | ICD-10-CM | POA: Insufficient documentation

## 2016-04-05 DIAGNOSIS — F17210 Nicotine dependence, cigarettes, uncomplicated: Secondary | ICD-10-CM | POA: Insufficient documentation

## 2016-04-05 DIAGNOSIS — E1122 Type 2 diabetes mellitus with diabetic chronic kidney disease: Secondary | ICD-10-CM | POA: Insufficient documentation

## 2016-04-05 DIAGNOSIS — R0600 Dyspnea, unspecified: Secondary | ICD-10-CM

## 2016-04-05 HISTORY — PX: CARDIAC CATHETERIZATION: SHX172

## 2016-04-05 LAB — POCT I-STAT 3, VENOUS BLOOD GAS (G3P V)
ACID-BASE EXCESS: 3 mmol/L — AB (ref 0.0–2.0)
Acid-Base Excess: 2 mmol/L (ref 0.0–2.0)
Bicarbonate: 28.8 mmol/L — ABNORMAL HIGH (ref 20.0–28.0)
Bicarbonate: 29.6 mmol/L — ABNORMAL HIGH (ref 20.0–28.0)
O2 Saturation: 56 %
O2 Saturation: 57 %
PH VEN: 7.356 (ref 7.250–7.430)
PH VEN: 7.361 (ref 7.250–7.430)
TCO2: 30 mmol/L (ref 0–100)
TCO2: 31 mmol/L (ref 0–100)
pCO2, Ven: 51.5 mmHg (ref 44.0–60.0)
pCO2, Ven: 52.3 mmHg (ref 44.0–60.0)
pO2, Ven: 31 mmHg — CL (ref 32.0–45.0)
pO2, Ven: 32 mmHg (ref 32.0–45.0)

## 2016-04-05 LAB — BASIC METABOLIC PANEL
ANION GAP: 12 (ref 5–15)
BUN: 22 mg/dL — ABNORMAL HIGH (ref 6–20)
CALCIUM: 9.3 mg/dL (ref 8.9–10.3)
CO2: 26 mmol/L (ref 22–32)
Chloride: 93 mmol/L — ABNORMAL LOW (ref 101–111)
Creatinine, Ser: 1.46 mg/dL — ABNORMAL HIGH (ref 0.44–1.00)
GFR calc non Af Amer: 39 mL/min — ABNORMAL LOW (ref >60)
GFR, EST AFRICAN AMERICAN: 45 mL/min — AB (ref >60)
GLUCOSE: 272 mg/dL — AB (ref 65–99)
POTASSIUM: 4.4 mmol/L (ref 3.5–5.1)
Sodium: 131 mmol/L — ABNORMAL LOW (ref 135–145)

## 2016-04-05 LAB — PROTIME-INR
INR: 1.38
PROTHROMBIN TIME: 17.1 s — AB (ref 11.4–15.2)

## 2016-04-05 LAB — GLUCOSE, CAPILLARY
GLUCOSE-CAPILLARY: 238 mg/dL — AB (ref 65–99)
Glucose-Capillary: 287 mg/dL — ABNORMAL HIGH (ref 65–99)

## 2016-04-05 LAB — CBC
HEMATOCRIT: 37.7 % (ref 36.0–46.0)
HEMOGLOBIN: 12.3 g/dL (ref 12.0–15.0)
MCH: 26.8 pg (ref 26.0–34.0)
MCHC: 32.6 g/dL (ref 30.0–36.0)
MCV: 82.1 fL (ref 78.0–100.0)
Platelets: 290 10*3/uL (ref 150–400)
RBC: 4.59 MIL/uL (ref 3.87–5.11)
RDW: 17.4 % — ABNORMAL HIGH (ref 11.5–15.5)
WBC: 10 10*3/uL (ref 4.0–10.5)

## 2016-04-05 SURGERY — RIGHT/LEFT HEART CATH AND CORONARY ANGIOGRAPHY

## 2016-04-05 MED ORDER — SODIUM CHLORIDE 0.9 % IV SOLN: 250.0000 mL | INTRAVENOUS | Status: DC | PRN

## 2016-04-05 MED ORDER — HEPARIN SODIUM (PORCINE) 1000 UNIT/ML IJ SOLN
INTRAMUSCULAR | Status: AC
  Filled 2016-04-05: qty 1

## 2016-04-05 MED ORDER — FENTANYL CITRATE (PF) 100 MCG/2ML IJ SOLN
INTRAMUSCULAR | Status: DC | PRN
  Administered 2016-04-05: 25 ug via INTRAVENOUS

## 2016-04-05 MED ORDER — VERAPAMIL HCL 2.5 MG/ML IV SOLN
INTRAVENOUS | Status: DC | PRN
  Administered 2016-04-05: 10 mL via INTRA_ARTERIAL

## 2016-04-05 MED ORDER — SODIUM CHLORIDE 0.9% FLUSH: 3.0000 mL | Freq: Two times a day (BID) | INTRAVENOUS | Status: DC

## 2016-04-05 MED ORDER — ONDANSETRON HCL 4 MG/2ML IJ SOLN: 4.0000 mg | Freq: Four times a day (QID) | INTRAMUSCULAR | Status: DC | PRN

## 2016-04-05 MED ORDER — HEPARIN (PORCINE) IN NACL 2-0.9 UNIT/ML-% IJ SOLN
INTRAMUSCULAR | Status: DC | PRN
  Administered 2016-04-05: 1000 mL

## 2016-04-05 MED ORDER — VERAPAMIL HCL 2.5 MG/ML IV SOLN
INTRAVENOUS | Status: AC
  Filled 2016-04-05: qty 2

## 2016-04-05 MED ORDER — ASPIRIN 81 MG PO CHEW
CHEWABLE_TABLET | ORAL | Status: AC
  Administered 2016-04-05: 81 mg via ORAL
  Filled 2016-04-05: qty 1

## 2016-04-05 MED ORDER — LIDOCAINE HCL (PF) 1 % IJ SOLN
INTRAMUSCULAR | Status: AC
  Filled 2016-04-05: qty 30

## 2016-04-05 MED ORDER — SODIUM CHLORIDE 0.9% FLUSH: 3.0000 mL | INTRAVENOUS | Status: DC | PRN

## 2016-04-05 MED ORDER — SODIUM CHLORIDE 0.9 % IV SOLN
INTRAVENOUS | Status: DC
  Administered 2016-04-05: 08:00:00 via INTRAVENOUS

## 2016-04-05 MED ORDER — SODIUM CHLORIDE 0.9 % WEIGHT BASED INFUSION: 1.0000 mL/kg/h | INTRAVENOUS | Status: AC

## 2016-04-05 MED ORDER — LIDOCAINE HCL (PF) 1 % IJ SOLN
INTRAMUSCULAR | Status: DC | PRN
  Administered 2016-04-05 (×2): 2 mL via SUBCUTANEOUS

## 2016-04-05 MED ORDER — IOPAMIDOL (ISOVUE-370) INJECTION 76%
INTRAVENOUS | Status: DC | PRN
  Administered 2016-04-05: 40 mL via INTRA_ARTERIAL

## 2016-04-05 MED ORDER — HEPARIN SODIUM (PORCINE) 1000 UNIT/ML IJ SOLN
INTRAMUSCULAR | Status: DC | PRN
  Administered 2016-04-05: 4000 [IU] via INTRAVENOUS

## 2016-04-05 MED ORDER — HEPARIN (PORCINE) IN NACL 2-0.9 UNIT/ML-% IJ SOLN
INTRAMUSCULAR | Status: AC
  Filled 2016-04-05: qty 1000

## 2016-04-05 MED ORDER — ASPIRIN 81 MG PO CHEW
81.0000 mg | CHEWABLE_TABLET | ORAL | Status: AC
  Administered 2016-04-05: 81 mg via ORAL

## 2016-04-05 MED ORDER — FENTANYL CITRATE (PF) 100 MCG/2ML IJ SOLN
INTRAMUSCULAR | Status: AC
  Filled 2016-04-05: qty 2

## 2016-04-05 MED ORDER — ACETAMINOPHEN 325 MG PO TABS: 650.0000 mg | ORAL_TABLET | ORAL | Status: DC | PRN

## 2016-04-05 MED ORDER — MIDAZOLAM HCL 2 MG/2ML IJ SOLN
INTRAMUSCULAR | Status: AC
  Filled 2016-04-05: qty 2

## 2016-04-05 MED ORDER — IOPAMIDOL (ISOVUE-370) INJECTION 76%
INTRAVENOUS | Status: AC
  Filled 2016-04-05: qty 100

## 2016-04-05 MED ORDER — MIDAZOLAM HCL 2 MG/2ML IJ SOLN
INTRAMUSCULAR | Status: DC | PRN
  Administered 2016-04-05: 1 mg via INTRAVENOUS

## 2016-04-05 SURGICAL SUPPLY — 12 items
CATH BALLN WEDGE 5F 110CM (CATHETERS) ×3 IMPLANT
CATH IMPULSE 5F ANG/FL3.5 (CATHETERS) ×3 IMPLANT
DEVICE RAD COMP TR BAND LRG (VASCULAR PRODUCTS) ×3 IMPLANT
GLIDESHEATH SLEND SS 6F .021 (SHEATH) ×3 IMPLANT
GUIDEWIRE INQWIRE 1.5J.035X260 (WIRE) ×1 IMPLANT
INQWIRE 1.5J .035X260CM (WIRE) ×3
KIT HEART LEFT (KITS) ×3 IMPLANT
PACK CARDIAC CATHETERIZATION (CUSTOM PROCEDURE TRAY) ×3 IMPLANT
SHEATH FAST CATH BRACH 5F 5CM (SHEATH) ×3 IMPLANT
TRANSDUCER W/STOPCOCK (MISCELLANEOUS) ×6 IMPLANT
TUBING CIL FLEX 10 FLL-RA (TUBING) ×3 IMPLANT
WIRE EMERALD 3MM-J .025X260CM (WIRE) ×3 IMPLANT

## 2016-04-05 NOTE — Interval H&P Note (Signed)
History and Physical Interval Note:  04/05/2016 9:36 AM  Kaitlin Cantrell  has presented today for surgery, with the diagnosis of chf  The various methods of treatment have been discussed with the patient and family. After consideration of risks, benefits and other options for treatment, the patient has consented to  Procedure(s): Right/Left Heart Cath and Coronary Angiography (N/A) as a surgical intervention .  The patient's history has been reviewed, patient examined, no change in status, stable for surgery.  I have reviewed the patient's chart and labs.  Questions were answered to the patient's satisfaction.     Dalton [employer]

## 2016-04-05 NOTE — Progress Notes (Signed)
Assumed care of pt from Clarice Pole, RN. Assessment documented. Report received.

## 2016-04-05 NOTE — Progress Notes (Signed)
Site area: Arts development officer Prior to Removal:  Level 0 Pressure Applied For: 15 minutes Manual:  yes  Patient Status During Pull:  stable Post Pull Site:  Level 0 Post Pull Instructions Given: yes   Post Pull Pulses Present: yes Dressing Applied:  tegaderm Bedrest begins @ 1050 Comments:

## 2016-04-05 NOTE — Discharge Instructions (Signed)
°  RESUME XARELTO TOMORROW  Radial Site Care Introduction Refer to this sheet in the next few weeks. These instructions provide you with information about caring for yourself after your procedure. Your health care provider may also give you more specific instructions. Your treatment has been planned according to current medical practices, but problems sometimes occur. Call your health care provider if you have any problems or questions after your procedure. What can I expect after the procedure? After your procedure, it is typical to have the following:  Bruising at the radial site that usually fades within 1-2 weeks.  Blood collecting in the tissue (hematoma) that may be painful to the touch. It should usually decrease in size and tenderness within 1-2 weeks. Follow these instructions at home:  Take medicines only as directed by your health care provider.  You may shower 24-48 hours after the procedure or as directed by your health care provider. Remove the bandage (dressing) and gently wash the site with plain soap and water. Pat the area dry with a clean towel. Do not rub the site, because this may cause bleeding.  Do not take baths, swim, or use a hot tub until your health care provider approves.  Check your insertion site every day for redness, swelling, or drainage.  Do not apply powder or lotion to the site.  Do not flex or bend the affected arm for 24 hours or as directed by your health care provider.  Do not push or pull heavy objects with the affected arm for 24 hours or as directed by your health care provider.  Do not lift over 10 lb (4.5 kg) for 5 days after your procedure or as directed by your health care provider.  Ask your health care provider when it is okay to:  Return to work or school.  Resume usual physical activities or sports.  Resume sexual activity.  Do not drive home if you are discharged the same day as the procedure. Have someone else drive you.  You  may drive 24 hours after the procedure unless otherwise instructed by your health care provider.  Do not operate machinery or power tools for 24 hours after the procedure.  If your procedure was done as an outpatient procedure, which means that you went home the same day as your procedure, a responsible adult should be with you for the first 24 hours after you arrive home.  Keep all follow-up visits as directed by your health care provider. This is important. Contact a health care provider if:  You have a fever.  You have chills.  You have increased bleeding from the radial site. Hold pressure on the site. Get help right away if:  You have unusual pain at the radial site.  You have redness, warmth, or swelling at the radial site.  You have drainage (other than a small amount of blood on the dressing) from the radial site.  The radial site is bleeding, and the bleeding does not stop after 30 minutes of holding steady pressure on the site.  Your arm or hand becomes pale, cool, tingly, or numb. This information is not intended to replace advice given to you by your health care provider. Make sure you discuss any questions you have with your health care provider. Document Released: 05/21/2010 Document Revised: 09/24/2015 Document Reviewed: 11/04/2013  2017 Elsevier

## 2016-04-05 NOTE — H&P (View-Only) (Signed)
PCP: Dr Delman Cheadle  Primary HF Cardiologist: Dr Aundra Dubin  HPI: Elonda Giuliano McClintockis a 60 y.o.female with history of COPD, tobacco abuse, DM, HTN, arthritis, and chronic pain syndrome who presented to [facility] with SOB in 8/17.  She had had several months of increasing exertional dyspnea prior to this.  CT angio 12/29/15 showed submassive PE with concerns for right heart strain. Now on Xarelto.  Echo 12/30/15 showed LVEF 20-25%, Grade 3 DD, Mod MR, mod LAE, Mild RV dilation with mildly reduced systolic function, Moderate RAE, Moderate TR. Peak PA pressure 54 mm Hg.  Cardiac MRI (9/17) showed EF 15%, moderate LV dilation, mild RV dilation/moderately decreased RV systolic function, no LGE.  She was diuresed in the hospital and begun on Xarelto.  Smokes 1 ppd (x 42 years). No ETOH or drug use. No history of cancer or recent surgery, no long car/plane trips prior to recent PE.   She returns to the HF clinic. Overall feeling much better. Denies SOB. Does admit to dyspnea with inclines. Denies PND/CP. Has not been able to afford xarelto. Having a hard time dealing with illness and caring for her family. Says she has been taking a family members. She has been 1 1/2 tablets of Xarelto. No bleeding problems. No CP. Weight at home 202-205 pounds. Smoking 1/2 PPD.    PMH: 1. PE: 12/2015 CT angio 12/29/15 submassive PE with concerns for right heart strain. Now on Xarelto.  2. Cardiomyopathy:  Found 8/17.  - CMRI 01/01/2016: EF 15%, moderate LV dilation, mild RV dilation/moderately decreased RV systolic function.  No LGE: no infiltrative or inflammatory process noted, no evidence for prior MI. - ECHO  12/30/2015: EF 20-25%. Grade III DD, Mod TR, PASP 54, mildly decreased RV systolic function.  - Hyperkalemia with spironolactone.  3. ABIs 01/05/2016 normal   4. COPD: Active smoker.  5. Type II diabetes 6. HTN 7. Chronic pain syndrome  Labs (9/17): SPEP negative, HIV negative, K 4.4, creatinine 1.22 Labs (10/17): LDL 111,  HDL 32, TSH normal, K 5.2, creatinine 1.49 Labs 02/15/2016: K 4.5 Creatinine 1.37   ROS: All systems negative except as listed in HPI, PMH and Problem List.  SH:  Social History   Social History  . Marital status: Married    Spouse name: N/A  . Number of children: 0  . Years of education: N/A   Occupational History  . retired    Social History Main Topics  . Smoking status: Current Every Day Smoker    Packs/day: 1.00    Years: 40.00    Types: Cigarettes  . Smokeless tobacco: Never Used  . Alcohol use No  . Drug use: No  . Sexual activity: Not on file   Other Topics Concern  . Not on file   Social History Narrative   Exercise walking daily (varies)    FH:  Family History  Problem Relation Age of Onset  . Colon polyps Father   . Diabetes Father     mother  . Heart disease Father   . Heart murmur Mother   . Diabetes Mother   . Hypertension Mother   . Diabetes Brother   . Cancer Maternal Grandmother   . Diabetes Paternal Grandmother   . Heart disease Paternal Grandmother   . COPD Paternal Grandfather   . Heart disease Paternal Grandfather   . Hypertension Brother   . Hypertension Brother     Current Outpatient Prescriptions  Medication Sig Dispense Refill  . atorvastatin (LIPITOR) 40 MG  tablet Take 1 tablet (40 mg total) by mouth daily. 30 tablet 3  . blood glucose meter kit and supplies KIT Dispense based on patient and insurance preference. Use up to four times daily as directed. (FOR ICD-9 250.00, 250.01). 1 each 0  . budesonide-formoterol (SYMBICORT) 160-4.5 MCG/ACT inhaler Inhale 1 puff into the lungs 2 (two) times daily. 1 Inhaler 12  . carvedilol (COREG) 3.125 MG tablet Take 1 tablet (3.125 mg total) by mouth 2 (two) times daily with a meal. 60 tablet 1  . cyclobenzaprine (FLEXERIL) 10 MG tablet TAKE ONE TABLET BY MOUTH THREE TIMES DAILY AS NEEDED FOR MUSCLE SPASMS 270 tablet 1  . diazepam (VALIUM) 5 MG tablet TAKE ONE TABLET BY MOUTH AT BEDTIME AND   MAY  REPEAT  DOSE  ONE  TIME  IF  NEEDED 180 tablet 1  . digoxin (LANOXIN) 0.125 MG tablet Take 1 tablet (0.125 mg total) by mouth daily. 30 tablet 3  . furosemide (LASIX) 40 MG tablet Take 1 tablet (40 mg total) by mouth daily. (Patient taking differently: Take 40 mg by mouth every other day. ) 30 tablet 1  . gabapentin (NEURONTIN) 300 MG capsule TAKE ONE CAPSULE BY MOUTH THREE TIMES DAILY. TITRATE UP SLOWLY. 90 capsule 0  . glimepiride (AMARYL) 4 MG tablet Take 1 tablet (4 mg total) by mouth 2 (two) times daily. 180 tablet 1  . HYDROcodone-acetaminophen (NORCO) 7.5-325 MG tablet Take 1 tablet by mouth every 6 (six) hours as needed for moderate pain. 90 tablet 0  . ipratropium (ATROVENT HFA) 17 MCG/ACT inhaler Inhale 2 puffs into the lungs 4 (four) times daily. 1 Inhaler 12  . Krill Oil CAPS Take 1 capsule by mouth daily.     Marland Kitchen omeprazole (PRILOSEC) 20 MG capsule Take 1 capsule (20 mg total) by mouth daily. 1 capsule 0  . ranitidine (ZANTAC) 150 MG tablet TAKE ONE TABLET BY MOUTH TWICE DAILY. 180 tablet 0  . Rivaroxaban (XARELTO) 20 MG TABS tablet Take 1 tablet (20 mg total) by mouth daily.    . sacubitril-valsartan (ENTRESTO) 24-26 MG Take 1 tablet by mouth 2 (two) times daily. 60 tablet 11  . traMADol (ULTRAM) 50 MG tablet Take 2 tablets (100 mg total) by mouth every 6 (six) hours as needed. 240 tablet 5  . varenicline (CHANTIX STARTING MONTH PAK) 0.5 MG X 11 & 1 MG X 42 tablet Take one 0.5 mg tablet by mouth once daily for 3 days, then increase to one 0.5 mg tablet twice daily for 4 days, then increase to one 1 mg tablet twice daily. (Patient not taking: Reported on 02/05/2016) 53 tablet 0   No current facility-administered medications for this encounter.     Vitals:   03/07/16 1420  BP: (!) 164/92  Pulse: 94  SpO2: 97%  Weight: 203 lb 6.4 oz (92.3 kg)    PHYSICAL EXAM: General:  NAD. Ambulated in the clinic without difficuty HEENT: normal Neck: supple. JVP flat. Carotids 2+  bilaterally; no bruits. No lymphadenopathy or thryomegaly appreciated. Cor: PMI normal. Regular rate & rhythm. No rubs, gallops or murmurs. Lungs: clear Abdomen: soft, nontender, nondistended. No hepatosplenomegaly. No bruits or masses. Good bowel sounds. Extremities: no cyanosis, clubbing, rash, edema. R and LLE varicosities Neuro: alert & orientedx3, cranial nerves grossly intact. Moves all 4 extremities w/o difficulty. Affect pleasant.  ASSESSMENT & PLAN: 1. H/O PE 12/2015: Submassive on CT.  Etiology uncertain: no known cancer, long trip, recent surgery.  She is fairly active.  Possibly related to stasis in the setting of cardiomyopathy with severely decreased systolic function.  - Given cardiomyopathy and submassive PE with no certain trigger, think she will need long-term anticoagulation.  Can not pay for Xarelto so she has been taking family members Xarelto.  Today she is being provided with samples. Continue Xarelto 20 mg daily  2. Chronic systolic CHF: ECHO 08/9250 EF 15-20% with diffuse hypokinesis.  Uncertain etiology.  No chest pain. Symptoms began as orthopnea around 6/17, then developed progressive exertional dyspnea. ECG shows anteroseptal MI but no change from 2013 ECG. Cardiac MRI did not show LGE. HIV negative, thyroid indices ok (mild TSH elevation but normal T3/T4), SPEP negative.   NYHA class II-III symptoms with fatigue most prominent.  Not volume overloaded on exam.  -Nneeds right and left heart cath for CAD evaluation.  In December it will be over 3 months since PEs. Hold xarelto the day before cath. She was set up for RHC/LHC for the first week of December.  - Increase carvedilol to 6.25 mg 3.125 mg twice daily.  - Continue digoxin 0.125 mg daily, check level today.  - Continue Entresto 24/26 bid. Received approval for [employer] for [employer].   - Continue Lasix 40 every other day   - Repeat echo in 6 months to look for recovery.  QRS not wide so would not be CRT  candidate.  Will make sure she has referral to cardiac rehab.  3. CKD: Stage III.    4. Active smoker: Counseled to quit. She has a prescription for Chantix. Says she is not ready to stop.  5. Hyperlipidemia: LDL high on lovastatin.  Continue  atorvastatin 40 mg daily. Check  lipids/LFTs in 4 weeks.  6. Hyperkalemia- not taking spiro due to hyperkalemia   Follow up the first week of December for RHC/LHC then 2 weeks after with Dr Aundra Dubin. Referred to [facility] for coping mechansims. Warren Lacy Valbona Slabach NP-C  03/07/2016

## 2016-04-06 ENCOUNTER — Telehealth (HOSPITAL_COMMUNITY): Payer: Self-pay | Admitting: Pharmacist

## 2016-04-06 ENCOUNTER — Encounter (HOSPITAL_COMMUNITY): Payer: Self-pay | Admitting: Cardiology

## 2016-04-06 NOTE — Telephone Encounter (Signed)
Kaitlin Cantrell is in the donut hole and cannot afford her Xarelto for the remainder of the year. Have provided her with samples to get her through.   Medication Samples have been provided to the patient.  Drug name: Xarelto       Strength: 20 mg        Qty: 28 LOT: [id number]  Exp.Date: 2/20  Dosing instructions: Take 1 tablet daily   The patient has been instructed regarding the correct time, dose, and frequency of taking this medication, including desired effects and most common side effects.   Tyler Deis Sharaya Boruff 11:56 AM 04/06/2016

## 2016-04-19 ENCOUNTER — Encounter (HOSPITAL_COMMUNITY): Payer: Self-pay

## 2016-04-19 ENCOUNTER — Ambulatory Visit (HOSPITAL_COMMUNITY)
Admission: RE | Admit: 2016-04-19 | Discharge: 2016-04-19 | Disposition: A | Discharge status: Home / Self Care | Payer: PPO | Source: Ambulatory Visit | Attending: Cardiology | Admitting: Cardiology

## 2016-04-19 VITALS — BP 128/80 | HR 88 | Wt 199.0 lb

## 2016-04-19 DIAGNOSIS — Z86711 Personal history of pulmonary embolism: Secondary | ICD-10-CM | POA: Diagnosis not present

## 2016-04-19 DIAGNOSIS — Z7901 Long term (current) use of anticoagulants: Secondary | ICD-10-CM | POA: Insufficient documentation

## 2016-04-19 DIAGNOSIS — Z79899 Other long term (current) drug therapy: Secondary | ICD-10-CM | POA: Insufficient documentation

## 2016-04-19 DIAGNOSIS — I130 Hypertensive heart and chronic kidney disease with heart failure and stage 1 through stage 4 chronic kidney disease, or unspecified chronic kidney disease: Secondary | ICD-10-CM | POA: Diagnosis not present

## 2016-04-19 DIAGNOSIS — E785 Hyperlipidemia, unspecified: Secondary | ICD-10-CM | POA: Insufficient documentation

## 2016-04-19 DIAGNOSIS — E1122 Type 2 diabetes mellitus with diabetic chronic kidney disease: Secondary | ICD-10-CM | POA: Diagnosis not present

## 2016-04-19 DIAGNOSIS — N183 Chronic kidney disease, stage 3 unspecified: Secondary | ICD-10-CM

## 2016-04-19 DIAGNOSIS — F17210 Nicotine dependence, cigarettes, uncomplicated: Secondary | ICD-10-CM | POA: Diagnosis not present

## 2016-04-19 DIAGNOSIS — I5022 Chronic systolic (congestive) heart failure: Secondary | ICD-10-CM | POA: Diagnosis not present

## 2016-04-19 DIAGNOSIS — G894 Chronic pain syndrome: Secondary | ICD-10-CM | POA: Insufficient documentation

## 2016-04-19 DIAGNOSIS — I428 Other cardiomyopathies: Secondary | ICD-10-CM | POA: Diagnosis not present

## 2016-04-19 DIAGNOSIS — I5042 Chronic combined systolic (congestive) and diastolic (congestive) heart failure: Secondary | ICD-10-CM

## 2016-04-19 DIAGNOSIS — I5041 Acute combined systolic (congestive) and diastolic (congestive) heart failure: Secondary | ICD-10-CM

## 2016-04-19 LAB — CBC
HEMATOCRIT: 39.2 % (ref 36.0–46.0)
HEMOGLOBIN: 12.7 g/dL (ref 12.0–15.0)
MCH: 26.9 pg (ref 26.0–34.0)
MCHC: 32.4 g/dL (ref 30.0–36.0)
MCV: 83.1 fL (ref 78.0–100.0)
Platelets: 346 10*3/uL (ref 150–400)
RBC: 4.72 MIL/uL (ref 3.87–5.11)
RDW: 15.7 % — ABNORMAL HIGH (ref 11.5–15.5)
WBC: 12 10*3/uL — AB (ref 4.0–10.5)

## 2016-04-19 LAB — BASIC METABOLIC PANEL
Anion gap: 13 (ref 5–15)
BUN: 22 mg/dL — AB (ref 6–20)
CHLORIDE: 89 mmol/L — AB (ref 101–111)
CO2: 27 mmol/L (ref 22–32)
CREATININE: 1.45 mg/dL — AB (ref 0.44–1.00)
Calcium: 9.4 mg/dL (ref 8.9–10.3)
GFR calc Af Amer: 45 mL/min — ABNORMAL LOW (ref >60)
GFR calc non Af Amer: 39 mL/min — ABNORMAL LOW (ref >60)
Glucose, Bld: 465 mg/dL — ABNORMAL HIGH (ref 65–99)
POTASSIUM: 5.2 mmol/L — AB (ref 3.5–5.1)
SODIUM: 129 mmol/L — AB (ref 135–145)

## 2016-04-19 LAB — DIGOXIN LEVEL: DIGOXIN LVL: 0.9 ng/mL (ref 0.8–2.0)

## 2016-04-19 MED ORDER — SACUBITRIL-VALSARTAN 49-51 MG PO TABS: 1.0000 | ORAL_TABLET | Freq: Two times a day (BID) | ORAL | 6 refills | Status: DC

## 2016-04-19 NOTE — Patient Instructions (Signed)
Increase Entresto to 49/51 mg Twice daily   Labs today  Labs in 10 days  You have been referred to Cardiac Rehab, they will contact you to schedule, if you do not hear from them by 1st of January please call us back.  Your physician recommends that you schedule a follow-up appointment in: 1 month

## 2016-04-20 NOTE — Progress Notes (Signed)
PCP: Dr Delman Cheadle  Primary HF Cardiologist: Dr Aundra Dubin  HPI: Kaitlin Erxleben McClintockis a 60 y.o.female with history of COPD, tobacco abuse, DM, HTN, arthritis, and chronic pain syndrome who presented to [facility] with SOB in 8/17.  She had had several months of increasing exertional dyspnea prior to this.  CT angio 12/29/15 showed submassive PE with concerns for right heart strain. Now on Xarelto.  Echo 12/30/15 showed LVEF 20-25%, Grade 3 DD, Mod MR, mod LAE, Mild RV dilation with mildly reduced systolic function, Moderate RAE, Moderate TR. Peak PA pressure 54 mm Hg.  Cardiac MRI (9/17) showed EF 15%, moderate LV dilation, mild RV dilation/moderately decreased RV systolic function, no LGE.  She was diuresed in the hospital and begun on Xarelto.  Smokes 1 ppd (x 42 years) but has set a quit take for 05/02/16. No ETOH or drug use. No history of cancer or recent surgery, no long car/plane trips prior to recent PE.   I stopped her spironolactone due to persistent hyperkalemia.    RHC/LHC in 12/17 showed no significant CAD, optimized filling pressures, low but not markedly low cardiac output.   Currently stable.  Short of breath walking "long distances."  Short of breath with steps or carrying laundary.  Poor energy level.  No orthopnea/PND.  Weight down 4 lbs.    PMH: 1. PE: 12/2015 CT angio 12/29/15 submassive PE with concerns for right heart strain. Now on Xarelto.  2. Cardiomyopathy:  Found 8/17.  - CMRI 01/01/2016: Nonischemic cardiomyopathy.  EF 15%, moderate LV dilation, mild RV dilation/moderately decreased RV systolic function.  No LGE: no infiltrative or inflammatory process noted, no evidence for prior MI. - ECHO  12/30/2015: EF 20-25%. Grade III DD, Mod TR, PASP 54, mildly decreased RV systolic function.  - Hyperkalemia with spironolactone.  - LHC/RHC (12/17): No CAD, mean RA 2, PA 39/13 mean 23, mean PCWP 7, CI 2.1.  3. ABIs 01/05/2016 normal   4. COPD: Active smoker.  5. Type II diabetes 6. HTN 7.  Chronic pain syndrome  Labs (9/17): SPEP negative, HIV negative, K 4.4, creatinine 1.22 Labs (10/17): LDL 111, HDL 32, TSH normal, K 5.2, creatinine 1.49, digoxin 0.7, BNP 245 Labs (12/17): K 4.4, creatinine 1.46  ROS: All systems negative except as listed in HPI, PMH and Problem List.  SH:  Social History   Social History  . Marital status: Married    Spouse name: N/A  . Number of children: 0  . Years of education: N/A   Occupational History  . retired    Social History Main Topics  . Smoking status: Current Every Day Smoker    Packs/day: 1.00    Years: 40.00    Types: Cigarettes  . Smokeless tobacco: Never Used  . Alcohol use No  . Drug use: No  . Sexual activity: Not on file   Other Topics Concern  . Not on file   Social History Narrative   Exercise walking daily (varies)    FH:  Family History  Problem Relation Age of Onset  . Colon polyps Father   . Diabetes Father     mother  . Heart disease Father   . Heart murmur Mother   . Diabetes Mother   . Hypertension Mother   . Diabetes Brother   . Cancer Maternal Grandmother   . Diabetes Paternal Grandmother   . Heart disease Paternal Grandmother   . COPD Paternal Grandfather   . Heart disease Paternal Grandfather   . Hypertension  Brother   . Hypertension Brother     Current Outpatient Prescriptions  Medication Sig Dispense Refill  . atorvastatin (LIPITOR) 40 MG tablet Take 1 tablet (40 mg total) by mouth daily. 30 tablet 3  . blood glucose meter kit and supplies KIT Dispense based on patient and insurance preference. Use up to four times daily as directed. (FOR ICD-9 250.00, 250.01). 1 each 0  . budesonide-formoterol (SYMBICORT) 160-4.5 MCG/ACT inhaler Inhale 1 puff into the lungs 2 (two) times daily. 1 Inhaler 12  . carvedilol (COREG) 6.25 MG tablet Take 1 tablet (6.25 mg total) by mouth 2 (two) times daily with a meal. 180 tablet 3  . cyclobenzaprine (FLEXERIL) 10 MG tablet TAKE ONE TABLET BY MOUTH  THREE TIMES DAILY AS NEEDED FOR MUSCLE SPASMS 270 tablet 1  . diazepam (VALIUM) 5 MG tablet TAKE ONE TABLET BY MOUTH AT BEDTIME AND  MAY  REPEAT  DOSE  ONE  TIME  IF  NEEDED 180 tablet 1  . digoxin (LANOXIN) 0.125 MG tablet Take 1 tablet (0.125 mg total) by mouth daily. 90 tablet 3  . furosemide (LASIX) 40 MG tablet Take 1 tablet (40 mg total) by mouth every other day. 45 tablet 3  . gabapentin (NEURONTIN) 300 MG capsule Take 1 capsule (300 mg total) by mouth 3 (three) times daily. 270 capsule 1  . glimepiride (AMARYL) 4 MG tablet Take 1 tablet (4 mg total) by mouth 2 (two) times daily. 180 tablet 1  . HYDROcodone-acetaminophen (NORCO) 7.5-325 MG tablet Take 1 tablet by mouth every 6 (six) hours as needed for moderate pain. 90 tablet 0  . ipratropium (ATROVENT HFA) 17 MCG/ACT inhaler Inhale 2 puffs into the lungs 4 (four) times daily. 1 Inhaler 12  . Krill Oil CAPS Take 1 capsule by mouth daily.     . ranitidine (ZANTAC) 150 MG tablet TAKE ONE TABLET BY MOUTH TWICE DAILY. 180 tablet 0  . rivaroxaban (XARELTO) 20 MG TABS tablet Take 1 tablet (20 mg total) by mouth daily. 30 tablet   . traMADol (ULTRAM) 50 MG tablet Take 2 tablets (100 mg total) by mouth every 6 (six) hours as needed. (Patient taking differently: Take 100 mg by mouth every 6 (six) hours as needed for moderate pain. ) 240 tablet 5  . sacubitril-valsartan (ENTRESTO) 49-51 MG Take 1 tablet by mouth 2 (two) times daily. 60 tablet 6   No current facility-administered medications for this encounter.     Vitals:   04/19/16 1019  BP: 128/80  Pulse: 88  SpO2: 98%  Weight: 199 lb (90.3 kg)    PHYSICAL EXAM:  General:  NAD HEENT: normal Neck: supple. JVP flat. Carotids 2+ bilaterally; no bruits. No lymphadenopathy or thryomegaly appreciated. Cor: PMI normal. Regular rate & rhythm. No rubs, gallops or murmurs. Lungs: clear Abdomen: soft, nontender, nondistended. No hepatosplenomegaly. No bruits or masses. Good bowel  sounds. Extremities: no cyanosis, clubbing, rash, edema. R and LLE varicosities Neuro: alert & orientedx3, cranial nerves grossly intact. Moves all 4 extremities w/o difficulty. Affect pleasant.  ASSESSMENT & PLAN: 1. H/O PE: Submassive on CT.  Etiology uncertain: no known cancer, long trip, recent surgery.  She is fairly active.  Possibly related to stasis in the setting of cardiomyopathy with severely decreased systolic function.  - Given cardiomyopathy and submassive PE with no certain trigger, think she will need long-term anticoagulation.  Continue Xarelto, CBC today.  2. Chronic systolic CHF: Nonischemic cardiomyopathy.  ECHO 12/2015 EF 15-20% with diffuse hypokinesis.  Uncertain  etiology.  No chest pain. Symptoms began as orthopnea around 6/17, then developed progressive exertional dyspnea. ECG shows anteroseptal MI but no change from 2013 ECG. Cardiac MRI did not show LGE. HIV negative, thyroid indices ok (mild TSH elevation but normal T3/T4), SPEP negative.  NYHA class II-III symptoms with fatigue most prominent.  Not volume overloaded on exam. 12/17 RHC/LHC showed no angiographic CAD, optimized filling pressures, low but not markedly low cardiac output.     - Continue carvedilol 6.25 mg bid.  - Continue digoxin 0.125 mg daily, check level today.  - Increase Entresto 49/51 bid, BMET today and again in 10 days.   - Continue spironolactone 25 mg daily.  - Continue Lasix 40 every other day.  - Echo in 3/18, ?ICD.  QRS not wide so would not be CRT candidate.  - Will refer to cardiac rehab.  3. CKD: Stage III.    4. Active smoker: Counseled to quit. She has a prescription for Chantix. Stop date 05/02/16. 5. Hyperlipidemia: Lipids next appointment.   Follow up in 1 month  Loralie Champagne 04/20/2016

## 2016-04-29 ENCOUNTER — Other Ambulatory Visit (HOSPITAL_COMMUNITY): Payer: Self-pay | Admitting: *Deleted

## 2016-04-29 ENCOUNTER — Ambulatory Visit (HOSPITAL_COMMUNITY)
Admission: RE | Admit: 2016-04-29 | Discharge: 2016-04-29 | Disposition: A | Discharge status: Home / Self Care | Payer: PPO | Source: Ambulatory Visit | Attending: Cardiology | Admitting: Cardiology

## 2016-04-29 DIAGNOSIS — J449 Chronic obstructive pulmonary disease, unspecified: Secondary | ICD-10-CM | POA: Diagnosis not present

## 2016-04-29 LAB — BASIC METABOLIC PANEL
Anion gap: 11 (ref 5–15)
BUN: 14 mg/dL (ref 6–20)
CHLORIDE: 95 mmol/L — AB (ref 101–111)
CO2: 26 mmol/L (ref 22–32)
CREATININE: 1.5 mg/dL — AB (ref 0.44–1.00)
Calcium: 9.2 mg/dL (ref 8.9–10.3)
GFR calc Af Amer: 43 mL/min — ABNORMAL LOW (ref >60)
GFR calc non Af Amer: 37 mL/min — ABNORMAL LOW (ref >60)
GLUCOSE: 450 mg/dL — AB (ref 65–99)
Potassium: 4.3 mmol/L (ref 3.5–5.1)
SODIUM: 132 mmol/L — AB (ref 135–145)

## 2016-05-10 ENCOUNTER — Other Ambulatory Visit: Payer: Self-pay

## 2016-05-10 ENCOUNTER — Other Ambulatory Visit: Payer: Self-pay | Admitting: Family Medicine

## 2016-05-10 NOTE — Telephone Encounter (Signed)
PATIENT WOULD LIKE DR. SHAW TO KNOW THAT SHE NEEDS TO GET A PRESCRIPTION FOR CHANTIX 1 MG. SHE ORIGINALLY GOT THE STARTER KIT. BEST PHONE [phone] (CELL) [place]

## 2016-05-11 MED ORDER — VARENICLINE TARTRATE 1 MG PO TABS: 1.0000 mg | ORAL_TABLET | Freq: Two times a day (BID) | ORAL | 1 refills | Status: DC

## 2016-05-19 ENCOUNTER — Inpatient Hospital Stay (HOSPITAL_COMMUNITY): Admission: RE | Admit: 2016-05-19 | Payer: PPO | Source: Ambulatory Visit

## 2016-05-20 ENCOUNTER — Encounter (HOSPITAL_COMMUNITY): Payer: PPO

## 2016-05-21 ENCOUNTER — Other Ambulatory Visit: Payer: Self-pay | Admitting: Family Medicine

## 2016-05-22 NOTE — Telephone Encounter (Signed)
01/2016 last ov 10/2015 last refill #270 with 1 refill

## 2016-05-22 NOTE — Telephone Encounter (Signed)
Perfect - thank you Raynelle Fanning!

## 2016-05-23 ENCOUNTER — Ambulatory Visit (HOSPITAL_COMMUNITY)
Admission: RE | Admit: 2016-05-23 | Discharge: 2016-05-23 | Disposition: A | Discharge status: Home / Self Care | Payer: PPO | Source: Ambulatory Visit | Attending: Cardiology | Admitting: Cardiology

## 2016-05-23 ENCOUNTER — Encounter (HOSPITAL_COMMUNITY): Payer: Self-pay

## 2016-05-23 ENCOUNTER — Telehealth (HOSPITAL_COMMUNITY): Payer: Self-pay | Admitting: *Deleted

## 2016-05-23 VITALS — BP 116/56 | HR 88 | Wt 193.5 lb

## 2016-05-23 DIAGNOSIS — N183 Chronic kidney disease, stage 3 unspecified: Secondary | ICD-10-CM

## 2016-05-23 DIAGNOSIS — E785 Hyperlipidemia, unspecified: Secondary | ICD-10-CM | POA: Insufficient documentation

## 2016-05-23 DIAGNOSIS — I5022 Chronic systolic (congestive) heart failure: Secondary | ICD-10-CM | POA: Diagnosis not present

## 2016-05-23 DIAGNOSIS — G894 Chronic pain syndrome: Secondary | ICD-10-CM | POA: Diagnosis not present

## 2016-05-23 DIAGNOSIS — Z79899 Other long term (current) drug therapy: Secondary | ICD-10-CM | POA: Diagnosis not present

## 2016-05-23 DIAGNOSIS — F17210 Nicotine dependence, cigarettes, uncomplicated: Secondary | ICD-10-CM | POA: Diagnosis not present

## 2016-05-23 DIAGNOSIS — J449 Chronic obstructive pulmonary disease, unspecified: Secondary | ICD-10-CM | POA: Diagnosis not present

## 2016-05-23 DIAGNOSIS — E875 Hyperkalemia: Secondary | ICD-10-CM | POA: Diagnosis not present

## 2016-05-23 DIAGNOSIS — Z7901 Long term (current) use of anticoagulants: Secondary | ICD-10-CM | POA: Insufficient documentation

## 2016-05-23 DIAGNOSIS — I252 Old myocardial infarction: Secondary | ICD-10-CM | POA: Insufficient documentation

## 2016-05-23 DIAGNOSIS — Z720 Tobacco use: Secondary | ICD-10-CM | POA: Diagnosis not present

## 2016-05-23 DIAGNOSIS — I428 Other cardiomyopathies: Secondary | ICD-10-CM | POA: Diagnosis not present

## 2016-05-23 DIAGNOSIS — I130 Hypertensive heart and chronic kidney disease with heart failure and stage 1 through stage 4 chronic kidney disease, or unspecified chronic kidney disease: Secondary | ICD-10-CM | POA: Diagnosis not present

## 2016-05-23 DIAGNOSIS — E1122 Type 2 diabetes mellitus with diabetic chronic kidney disease: Secondary | ICD-10-CM | POA: Diagnosis not present

## 2016-05-23 LAB — LIPID PANEL
CHOLESTEROL: 148 mg/dL (ref 0–200)
HDL: 26 mg/dL — ABNORMAL LOW (ref >40)
LDL Cholesterol: 83 mg/dL (ref 0–99)
Total CHOL/HDL Ratio: 5.7 RATIO
Triglycerides: 194 mg/dL — ABNORMAL HIGH (ref <150)
VLDL: 39 mg/dL (ref 0–40)

## 2016-05-23 LAB — BASIC METABOLIC PANEL
ANION GAP: 9 (ref 5–15)
BUN: 16 mg/dL (ref 6–20)
CALCIUM: 9.1 mg/dL (ref 8.9–10.3)
CHLORIDE: 94 mmol/L — AB (ref 101–111)
CO2: 29 mmol/L (ref 22–32)
Creatinine, Ser: 1.37 mg/dL — ABNORMAL HIGH (ref 0.44–1.00)
GFR calc non Af Amer: 42 mL/min — ABNORMAL LOW (ref >60)
GFR, EST AFRICAN AMERICAN: 48 mL/min — AB (ref >60)
Glucose, Bld: 394 mg/dL — ABNORMAL HIGH (ref 65–99)
Potassium: 4.6 mmol/L (ref 3.5–5.1)
Sodium: 132 mmol/L — ABNORMAL LOW (ref 135–145)

## 2016-05-23 LAB — DIGOXIN LEVEL: Digoxin Level: 1.3 ng/mL (ref 0.8–2.0)

## 2016-05-23 MED ORDER — DIGOXIN 125 MCG PO TABS: 0.0625 mg | ORAL_TABLET | Freq: Every day | ORAL | 3 refills | Status: DC

## 2016-05-23 MED ORDER — CARVEDILOL 12.5 MG PO TABS: 12.5000 mg | ORAL_TABLET | Freq: Two times a day (BID) | ORAL | 3 refills | Status: DC

## 2016-05-23 NOTE — Telephone Encounter (Signed)
Notes Recorded by Modesta Messing, CMA on 05/23/2016 at 1:47 PM EST Pt aware and agreeable. ------  Notes Recorded by Laurey Morale, MD on 05/23/2016 at 1:27 PM EST Decrease digoxin to 0.0625 daily, please call.    Ref Range & Units 11:45 86mo ago 89mo ago   Digoxin Level 0.8 - 2.0 ng/mL 1.3  0.9  0.7

## 2016-05-23 NOTE — Telephone Encounter (Signed)
-----   Message from Laurey Morale, MD sent at 05/23/2016  1:27 PM EST ----- Decrease digoxin to 0.0625 daily, please call.

## 2016-05-23 NOTE — Progress Notes (Signed)
PCP: Dr Delman Cheadle  Primary HF Cardiologist: Dr Aundra Dubin  HPI: Kaitlin Cantrell a 61 y.o.female with history of COPD, tobacco abuse, DM, HTN, arthritis, and chronic pain syndrome who presented to [facility] with SOB in 8/17.  She had had several months of increasing exertional dyspnea prior to this.  CT angio 12/29/15 showed submassive PE with concerns for right heart strain. Now on Xarelto.  Echo 12/30/15 showed LVEF 20-25%, Grade 3 DD, Mod MR, mod LAE, Mild RV dilation with mildly reduced systolic function, Moderate RAE, Moderate TR. Peak PA pressure 54 mm Hg.  Cardiac MRI (9/17) showed EF 15%, moderate LV dilation, mild RV dilation/moderately decreased RV systolic function, no LGE.  She was diuresed in the hospital and begun on Xarelto.  Smokes 1 ppd (x 42 years) but has recently started Chantix and has been cutting back on cigarettes. No ETOH or drug use. No history of cancer or recent surgery, no long car/plane trips prior to her PE.   I stopped her spironolactone due to persistent hyperkalemia in 9/17.    RHC/LHC in 12/17 showed no significant CAD, optimized filling pressures, low but not markedly low cardiac output.   Currently stable.  No dyspnea walking on flat ground.  Mildly short of breath with steps or carrying laundary.  No orthopnea/PND.  Weight down 6 lbs.  Rare lightheadedness.  No BRBPR or melena.   PMH: 1. PE: 12/2015 CT angio 12/29/15 submassive PE with concerns for right heart strain. Now on Xarelto.  2. Cardiomyopathy:  Found 8/17.  - CMRI 01/01/2016: Nonischemic cardiomyopathy.  EF 15%, moderate LV dilation, mild RV dilation/moderately decreased RV systolic function.  No LGE: no infiltrative or inflammatory process noted, no evidence for prior MI. - ECHO  12/30/2015: EF 20-25%. Grade III DD, Mod TR, PASP 54, mildly decreased RV systolic function.  - Hyperkalemia with spironolactone.  - LHC/RHC (12/17): No CAD, mean RA 2, PA 39/13 mean 23, mean PCWP 7, CI 2.1.  3. ABIs 01/05/2016  normal   4. COPD: Active smoker.  5. Type II diabetes 6. HTN 7. Chronic pain syndrome  Labs (9/17): SPEP negative, HIV negative, K 4.4, creatinine 1.22 Labs (10/17): LDL 111, HDL 32, TSH normal, K 5.2, creatinine 1.49, digoxin 0.7, BNP 245 Labs (12/17): K 4.4, creatinine 1.46 => 1.5, digoxin 0.9  ROS: All systems negative except as listed in HPI, PMH and Problem List.  SH:  Social History   Social History  . Marital status: Married    Spouse name: N/A  . Number of children: 0  . Years of education: N/A   Occupational History  . retired    Social History Main Topics  . Smoking status: Current Every Day Smoker    Packs/day: 1.00    Years: 40.00    Types: Cigarettes  . Smokeless tobacco: Never Used  . Alcohol use No  . Drug use: No  . Sexual activity: Not on file   Other Topics Concern  . Not on file   Social History Narrative   Exercise walking daily (varies)    FH:  Family History  Problem Relation Age of Onset  . Colon polyps Father   . Diabetes Father     mother  . Heart disease Father   . Heart murmur Mother   . Diabetes Mother   . Hypertension Mother   . Diabetes Brother   . Cancer Maternal Grandmother   . Diabetes Paternal Grandmother   . Heart disease Paternal Grandmother   .  COPD Paternal Grandfather   . Heart disease Paternal Grandfather   . Hypertension Brother   . Hypertension Brother     Current Outpatient Prescriptions  Medication Sig Dispense Refill  . atorvastatin (LIPITOR) 40 MG tablet Take 1 tablet (40 mg total) by mouth daily. 30 tablet 3  . atorvastatin (LIPITOR) 40 MG tablet TAKE ONE TABLET BY MOUTH ONCE DAILY 90 tablet 0  . blood glucose meter kit and supplies KIT Dispense based on patient and insurance preference. Use up to four times daily as directed. (FOR ICD-9 250.00, 250.01). 1 each 0  . budesonide-formoterol (SYMBICORT) 160-4.5 MCG/ACT inhaler Inhale 1 puff into the lungs 2 (two) times daily. 1 Inhaler 12  . carvedilol  (COREG) 12.5 MG tablet Take 1 tablet (12.5 mg total) by mouth 2 (two) times daily with a meal. 60 tablet 3  . cyclobenzaprine (FLEXERIL) 10 MG tablet TAKE ONE TABLET BY MOUTH THREE TIMES DAILY AS NEEDED FOR MUSCLE SPASMS 270 tablet 0  . diazepam (VALIUM) 5 MG tablet TAKE ONE TABLET BY MOUTH AT BEDTIME AND  MAY  REPEAT  DOSE  ONE  TIME  IF  NEEDED 180 tablet 1  . furosemide (LASIX) 40 MG tablet Take 1 tablet (40 mg total) by mouth every other day. 45 tablet 3  . gabapentin (NEURONTIN) 300 MG capsule Take 1 capsule (300 mg total) by mouth 3 (three) times daily. 270 capsule 1  . glimepiride (AMARYL) 4 MG tablet Take 1 tablet (4 mg total) by mouth 2 (two) times daily. 180 tablet 1  . HYDROcodone-acetaminophen (NORCO) 7.5-325 MG tablet Take 1 tablet by mouth every 6 (six) hours as needed for moderate pain. 90 tablet 0  . ipratropium (ATROVENT HFA) 17 MCG/ACT inhaler Inhale 2 puffs into the lungs 4 (four) times daily. 1 Inhaler 12  . Krill Oil CAPS Take 1 capsule by mouth daily.     . ranitidine (ZANTAC) 150 MG tablet TAKE ONE TABLET BY MOUTH TWICE DAILY. 180 tablet 0  . rivaroxaban (XARELTO) 20 MG TABS tablet Take 1 tablet (20 mg total) by mouth daily. 30 tablet   . sacubitril-valsartan (ENTRESTO) 49-51 MG Take 1 tablet by mouth 2 (two) times daily. 60 tablet 6  . traMADol (ULTRAM) 50 MG tablet Take 2 tablets (100 mg total) by mouth every 6 (six) hours as needed. (Patient taking differently: Take 100 mg by mouth every 6 (six) hours as needed for moderate pain. ) 240 tablet 5  . varenicline (CHANTIX CONTINUING MONTH PAK) 1 MG tablet Take 1 tablet (1 mg total) by mouth 2 (two) times daily. 180 tablet 1  . digoxin (LANOXIN) 0.125 MG tablet Take 0.5 tablets (0.0625 mg total) by mouth daily. 45 tablet 3   No current facility-administered medications for this encounter.     Vitals:   05/23/16 1121  BP: (!) 116/56  Pulse: 88  SpO2: 100%  Weight: 193 lb 8 oz (87.8 kg)    PHYSICAL EXAM:  General:   NAD HEENT: normal Neck: supple. JVP flat. Carotids 2+ bilaterally; no bruits. No lymphadenopathy or thryomegaly appreciated. Cor: PMI normal. Regular rate & rhythm. No rubs, gallops or murmurs. Lungs: clear Abdomen: soft, nontender, nondistended. No hepatosplenomegaly. No bruits or masses. Good bowel sounds. Extremities: no cyanosis, clubbing, rash, edema. R and LLE varicosities Neuro: alert & orientedx3, cranial nerves grossly intact. Moves all 4 extremities w/o difficulty. Affect pleasant.  ASSESSMENT & PLAN: 1. H/O PE: Submassive on CT.  Etiology uncertain: no known cancer, long trip, recent surgery.  She is fairly active.  Possibly related to stasis in the setting of cardiomyopathy with severely decreased systolic function.  - Given cardiomyopathy and submassive PE with no certain trigger, think she will need long-term anticoagulation.  Continue Xarelto. 2. Chronic systolic CHF: Nonischemic cardiomyopathy.  ECHO 12/2015 EF 15-20% with diffuse hypokinesis.  Uncertain etiology.  No chest pain. Symptoms began as orthopnea around 6/17, then developed progressive exertional dyspnea. ECG shows anteroseptal MI but no change from 2013 ECG. Cardiac MRI did not show LGE. HIV negative, thyroid indices ok (mild TSH elevation but normal T3/T4), SPEP negative.  NYHA class II currently.  Not volume overloaded on exam. 12/17 RHC/LHC showed no angiographic CAD, optimized filling pressures, low but not markedly low cardiac output.     - Increase Coreg to 12.5 mg bid.  - Continue digoxin 0.125 mg daily, check level today.  - Continue Entresto 49/51 bid.   - Off spironolactone with persistent hyperkalemia.  At next appointment, would consider re-trying spironolactone along with Veltassa.  - Continue Lasix 40 every other day. BMET today.  - Echo in 3/18, ?ICD.  QRS not wide so would not be CRT candidate.  - Will refer to cardiac rehab.  3. CKD: Stage III.    4. Active smoker: Counseled to quit. She has started  Chantix, now cutting back.  5. Hyperlipidemia: Check lipids today.   Follow up in 3/18 with echo.   Loralie Champagne 05/23/2016

## 2016-05-23 NOTE — Patient Instructions (Signed)
Labs today (will call for abnormal results, otherwise no news is good news)  Increase Coreg to 12.5 mg (1 Tablet) Two times daily  Follow up with Echo in 2 months

## 2016-05-30 ENCOUNTER — Telehealth (HOSPITAL_COMMUNITY): Payer: Self-pay | Admitting: Pharmacist

## 2016-05-30 NOTE — Telephone Encounter (Signed)
Entresto 49-51 mg BID PA approved by [employer] through 05/01/17.   Tyler Deis. Bonnye Fava, PharmD, BCPS, CPP Clinical Pharmacist Pager: [phone] Phone: [phone] 05/30/2016 11:02 AM

## 2016-06-23 ENCOUNTER — Other Ambulatory Visit: Payer: Self-pay | Admitting: Family Medicine

## 2016-06-23 ENCOUNTER — Encounter: Payer: Self-pay | Admitting: Family Medicine

## 2016-06-23 ENCOUNTER — Ambulatory Visit (INDEPENDENT_AMBULATORY_CARE_PROVIDER_SITE_OTHER): Payer: PPO | Admitting: Family Medicine

## 2016-06-23 VITALS — BP 92/58 | HR 70 | Temp 98.5°F | Resp 16 | Ht 66.0 in | Wt 191.0 lb

## 2016-06-23 DIAGNOSIS — G4700 Insomnia, unspecified: Secondary | ICD-10-CM | POA: Diagnosis not present

## 2016-06-23 DIAGNOSIS — E119 Type 2 diabetes mellitus without complications: Secondary | ICD-10-CM | POA: Diagnosis not present

## 2016-06-23 DIAGNOSIS — G894 Chronic pain syndrome: Secondary | ICD-10-CM

## 2016-06-23 DIAGNOSIS — I10 Essential (primary) hypertension: Secondary | ICD-10-CM

## 2016-06-23 DIAGNOSIS — Z720 Tobacco use: Secondary | ICD-10-CM | POA: Diagnosis not present

## 2016-06-23 DIAGNOSIS — Z5181 Encounter for therapeutic drug level monitoring: Secondary | ICD-10-CM | POA: Diagnosis not present

## 2016-06-23 DIAGNOSIS — J449 Chronic obstructive pulmonary disease, unspecified: Secondary | ICD-10-CM

## 2016-06-23 DIAGNOSIS — Z23 Encounter for immunization: Secondary | ICD-10-CM

## 2016-06-23 DIAGNOSIS — E785 Hyperlipidemia, unspecified: Secondary | ICD-10-CM | POA: Diagnosis not present

## 2016-06-23 LAB — GLUCOSE, POCT (MANUAL RESULT ENTRY): POC GLUCOSE: 350 mg/dL — AB (ref 70–99)

## 2016-06-23 LAB — POCT GLYCOSYLATED HEMOGLOBIN (HGB A1C): Hemoglobin A1C: 12.1

## 2016-06-23 MED ORDER — HYDROCODONE-ACETAMINOPHEN 7.5-325 MG PO TABS: 1.0000 | ORAL_TABLET | Freq: Three times a day (TID) | ORAL | 0 refills | Status: DC | PRN

## 2016-06-23 MED ORDER — TRAMADOL HCL 50 MG PO TABS: 100.0000 mg | ORAL_TABLET | Freq: Four times a day (QID) | ORAL | 3 refills | Status: DC | PRN

## 2016-06-23 MED ORDER — DIAZEPAM 5 MG PO TABS: ORAL_TABLET | ORAL | 0 refills | Status: DC

## 2016-06-23 NOTE — Progress Notes (Addendum)
Subjective:    Patient ID: Kaitlin Cantrell, female    DOB: 1956/04/09, 61 y.o.   MRN: [id number]  Chief Complaint  Patient presents with  . Follow-up  . Medication Refill    all    HPI Kaitlin Cantrell is a delightful 61 woman here for follow-up. She is having a lot of problems with her potassium staying high and repeated recheck.  PE with right heart strain 8/9. On xarelto permanently now as no risk factor or inciting even found.  CHF and cardiomyopathy: New diagnosis last mo, ER was 15-25%.  Seeing cardiology who is planning for right and left heart cath after she has been stable on the xarelto for sev mos and would be sufficiently safe for her to come off during the procedure.  On lasix 91m qd in addition to Entresto (sacubitril-valsartan), digoxin, Coreg, and spironolactone.  Is having a lot of fatigue even with minimal activity.  Weight:   Seeing cardiology Dr. MAundra Dubinwho had her hold her spironolactone and K and then decreased lasix to qod after she had persistent hyperkalemia. She is repeating EKG on 3/6 to see if needs a defbrillator put in.  COPD with ongoing tobacco abuse 1 ppd x 42 yrs.  On symbicort bid. Pt finally started chantix about a month ago. She has been t  DM: a1c 6.5 ->7.1 -> 6.9 4 mos piror.  Urine microalb nml 8 mos prior (01/17).  Saw optho Dr. GKaty Fitch11/16.  On amaryl 226mbid and metformin 1000 bid. On acei/arb.  HTN:  On carvedilol 3.125 bid  Chronic pain syndrome from arthritis: prn flexeril,qhs valium, gabapentin 300 tid, hydrocodone 7.5, and tramadol.  Takes the tramadol scheduled 2 tabs four times a day and uses the hydrocodone truly prn - someday   HPL: On lovastatin 20. LDL Goal <70 - now slihtly above goal at least check 3 mos prior LDL 87, non HDL 111. LIpid panel 1 mo ago LDL 83,   CKDIII: eGFR 42 lat mo, quit prilosec. Needing nexium rarely (2x/mol) but stays on a H2 blocker.   she is considering cardiac rehab but right now she is getting in a lot  of her bills, meds are expensive.  Added in so many dietary restrictions - can't have phosphorus and K.  Addicted to sugar. Can't have carbs, fat, or salt - gets really frustrating to find what she can eat so usually defaults to the carb/sugar rather than put her heart/kidneys in danger. Has started dirnking soda twice a day when prior used to be once a week or less. Fasting today for labs. None of the meds make her to tired, she is sleeping well on her current medication regimen.  Past Medical History:  Diagnosis Date  . Allergy    seasonal  . Arthritis   . Diabetes mellitus   . GERD (gastroesophageal reflux disease)   . History of pneumonia    15 years ago  . Hyperlipidemia   . Hypertension   . PE (pulmonary embolism) 12/2015   Past Surgical History:  Procedure Laterality Date  . ANKLE FRACTURE SURGERY  1974   left and pinned then pins removed  . ANKLE GANGLION CYST EXCISION  1994   right  . BACK SURGERY  1998   discectomy and then fusion  . CARDIAC CATHETERIZATION N/A 04/05/2016   Procedure: Right/Left Heart Cath and Coronary Angiography;  Surgeon: DaLarey DresserMD;  Location[facility];  Service: Cardiovascular;  Laterality: N/A;  . KNEE  ARTHROPLASTY  02/14/2012   Procedure: COMPUTER ASSISTED TOTAL KNEE ARTHROPLASTY;  Surgeon: Meredith Pel, MD;  Location: [facility];  Service: Orthopedics;  Laterality: Left;  Left total knee arthroplasty  . TONSILLECTOMY  1972  . TUBAL LIGATION  1996  . UMBILICAL HERNIA REPAIR  1996   Current Outpatient Prescriptions on File Prior to Visit  Medication Sig Dispense Refill  . atorvastatin (LIPITOR) 40 MG tablet TAKE ONE TABLET BY MOUTH ONCE DAILY 90 tablet 0  . blood glucose meter kit and supplies KIT Dispense based on patient and insurance preference. Use up to four times daily as directed. (FOR ICD-9 250.00, 250.01). 1 each 0  . budesonide-formoterol (SYMBICORT) 160-4.5 MCG/ACT inhaler Inhale 1 puff into the lungs 2 (two) times  daily. 1 Inhaler 12  . carvedilol (COREG) 12.5 MG tablet Take 1 tablet (12.5 mg total) by mouth 2 (two) times daily with a meal. 60 tablet 3  . cyclobenzaprine (FLEXERIL) 10 MG tablet TAKE ONE TABLET BY MOUTH THREE TIMES DAILY AS NEEDED FOR MUSCLE SPASMS 270 tablet 0  . digoxin (LANOXIN) 0.125 MG tablet Take 0.5 tablets (0.0625 mg total) by mouth daily. 45 tablet 3  . furosemide (LASIX) 40 MG tablet Take 1 tablet (40 mg total) by mouth every other day. 45 tablet 3  . gabapentin (NEURONTIN) 300 MG capsule Take 1 capsule (300 mg total) by mouth 3 (three) times daily. 270 capsule 1  . glimepiride (AMARYL) 4 MG tablet Take 1 tablet (4 mg total) by mouth 2 (two) times daily. 180 tablet 1  . ipratropium (ATROVENT HFA) 17 MCG/ACT inhaler Inhale 2 puffs into the lungs 4 (four) times daily. 1 Inhaler 12  . Krill Oil CAPS Take 1 capsule by mouth daily.     . ranitidine (ZANTAC) 150 MG tablet TAKE ONE TABLET BY MOUTH TWICE DAILY. 180 tablet 0  . rivaroxaban (XARELTO) 20 MG TABS tablet Take 1 tablet (20 mg total) by mouth daily. 30 tablet   . sacubitril-valsartan (ENTRESTO) 49-51 MG Take 1 tablet by mouth 2 (two) times daily. 60 tablet 6  . varenicline (CHANTIX CONTINUING MONTH PAK) 1 MG tablet Take 1 tablet (1 mg total) by mouth 2 (two) times daily. 180 tablet 1   No current facility-administered medications on file prior to visit.    Allergies  Allergen Reactions  . Albuterol Anaphylaxis    Throat/lips swelling  . Morphine And Related Other (See Comments)    Very aggressive and doesn't help pain   Family History  Problem Relation Age of Onset  . Colon polyps Father   . Diabetes Father     mother  . Heart disease Father   . Heart murmur Mother   . Diabetes Mother   . Hypertension Mother   . Diabetes Brother   . Cancer Maternal Grandmother   . Diabetes Paternal Grandmother   . Heart disease Paternal Grandmother   . COPD Paternal Grandfather   . Heart disease Paternal Grandfather   .  Hypertension Brother   . Hypertension Brother    Social History   Social History  . Marital status: Married    Spouse name: N/A  . Number of children: 0  . Years of education: N/A   Occupational History  . retired    Social History Main Topics  . Smoking status: Current Every Day Smoker    Packs/day: 1.00    Years: 40.00    Types: Cigarettes  . Smokeless tobacco: Never Used  . Alcohol use No  .  Drug use: No  . Sexual activity: Not Asked   Other Topics Concern  . None   Social History Narrative   Exercise walking daily (varies)   Depression screen [facility] 2/9 06/23/2016 03/10/2016 02/04/2016 01/14/2016 12/28/2015  Decreased Interest 0 0 0 0 0  Down, Depressed, Hopeless 0 1 1 0 0  PHQ - 2 Score 0 1 1 0 0     Review of Systems  Constitutional: Positive for fatigue and unexpected weight change (loss). Negative for activity change, appetite change, chills, diaphoresis and fever.  Respiratory: Positive for cough and wheezing. Negative for chest tightness and shortness of breath.   Cardiovascular: Negative for chest pain, palpitations and leg swelling.  Musculoskeletal: Positive for arthralgias, back pain, joint swelling and myalgias. Negative for gait problem.  Allergic/Immunologic: Positive for immunocompromised state.  Neurological: Positive for weakness and light-headedness. Negative for dizziness, tremors, syncope, facial asymmetry, numbness and headaches.  Psychiatric/Behavioral: Positive for dysphoric mood and sleep disturbance. Negative for agitation, behavioral problems, confusion and decreased concentration. The patient is nervous/anxious.        Objective:   Physical Exam  Constitutional: She is oriented to person, place, and time. She appears well-developed and well-nourished. No distress.  HENT:  Head: Normocephalic and atraumatic.  Right Ear: External ear normal.  Left Ear: External ear normal.  Eyes: Conjunctivae are normal. No scleral icterus.  Neck: Normal  range of motion. Neck supple. No thyromegaly present.  Cardiovascular: Normal rate, regular rhythm, normal heart sounds and intact distal pulses.   Pulmonary/Chest: Effort normal. No respiratory distress. She has decreased breath sounds. She has wheezes.  Musculoskeletal: She exhibits no edema.  Lymphadenopathy:    She has no cervical adenopathy.  Neurological: She is alert and oriented to person, place, and time.  Skin: Skin is warm and dry. She is not diaphoretic. No erythema.  Psychiatric: She has a normal mood and affect. Her behavior is normal.   BP (!) 92/58   Pulse 70   Temp 98.5 F (36.9 C) (Oral)   Resp 16   Ht '5\' 6"'  (1.676 m)   Wt 191 lb (86.6 kg)   SpO2 94%   BMI 30.83 kg/m    Results for orders placed or performed in visit on 06/23/16  POCT glycosylated hemoglobin (Hb A1C)  Result Value Ref Range   Hemoglobin A1C 12.1   POCT glucose (manual entry)  Result Value Ref Range   POC Glucose 350 (A) 70 - 99 mg/dl   Diabetic Foot Exam - Simple   Simple Foot Form Diabetic Foot exam was performed with the following findings:  Yes 06/23/2016 12:48 PM  Visual Inspection No deformities, no ulcerations, no other skin breakdown bilaterally:  Yes Sensation Testing Intact to touch and monofilament testing bilaterally:  Yes Pulse Check Posterior Tibialis and Dorsalis pulse intact bilaterally:  Yes Comments        Assessment & Plan:   1. Type 2 diabetes mellitus without complication, without long-term current use of insulin (HCC) - We had to stop metformin due to it decline in her kidney function and patient has been maxed out on Amaryl. Unfortunately her hemoglobin A1c has significantly worsened from 7-12.1. Patient is are ready having difficulty affording her chronic medications each month with the price of her inhalers, esp Symbicort, and so does not think that she will be able to afford the co-pay on any of the newer brand-name only diabetic oral medications available. TZD  contraind due to CHF. I think patient needs to  start on insulin. She states she's been drinking soda twice a day and being fairly liberal with her sugar intake so feels very motivated to make significant changes in her di in 3 months and if not significantly improved likely start insulin at that time. Encouraged patient to bring her formulary with her to her next visit to ensure there are not less expensive alternatives for some of her medications.  Hopefully patient will be financially able to start cardiac rehabilitation which should help lower cbgs significantly.   Needs urine microalb at f/u  2. Essential hypertension   3. Tobacco abuse - cont chantix - has been on 1 mo and decreased from 1 ppd to 1/2 ppd - more habit than nicotine addiction, encouraged pt to call [phone].  4. Medication monitoring encounter   5. Chronic pain syndrome - refilled chronic pain meds of tramadol 16m qid scheduled with prn hydrocodone 7.571m#90/mo. Needs routine UDS at f/u. Continues to use cyclobenzaprine qhs and occ during day as well.  6. Hyperlipidemia, unspecified hyperlipidemia type   7. Chronic obstructive pulmonary disease, unspecified COPD type ([place]- cont daily symbicort  8. Insomnia, unspecified type - refilled prn valium     Orders Placed This Encounter  Procedures  . Pneumococcal polysaccharide vaccine 23-valent greater than or equal to 2yo subcutaneous/IM  . Comprehensive metabolic panel  . POCT glycosylated hemoglobin (Hb A1C)  . POCT glucose (manual entry)  . HM DIABETES FOOT EXAM    Meds ordered this encounter  Medications  . diazepam (VALIUM) 5 MG tablet    Sig: TAKE ONE TABLET BY MOUTH AT BEDTIME AND  MAY  REPEAT  DOSE  ONE  TIME  IF  NEEDED    Dispense:  180 tablet    Refill:  0    May fill on or after 07/31/16  . traMADol (ULTRAM) 50 MG tablet    Sig: Take 2 tablets (100 mg total) by mouth every 6 (six) hours as needed for moderate pain.    Dispense:  240 tablet    Refill:  3      May fill on or after 07/31/16  . HYDROcodone-acetaminophen (NORCO) 7.5-325 MG tablet    Sig: Take 1 tablet by mouth every 8 (eight) hours as needed for moderate pain.    Dispense:  90 tablet    Refill:  0  . HYDROcodone-acetaminophen (NORCO) 7.5-325 MG tablet    Sig: Take 1 tablet by mouth every 8 (eight) hours as needed for moderate pain.    Dispense:  90 tablet    Refill:  0    May fill 60 days from date written  . HYDROcodone-acetaminophen (NORCO) 7.5-325 MG tablet    Sig: Take 1 tablet by mouth every 8 (eight) hours as needed for moderate pain.    Dispense:  90 tablet    Refill:  0    May fill 30d from date written   Today I have utilized the [place] Controlled Substance Registry's online query to confirm compliance regarding the patient's narcotic pain medications. My review reveals appropriate prescription fills and that [facility] is the sole provider of these medications. Rechecks will occur regularly and the patient is aware of our use of the system.  EvDelman CheadleM.D.  [facility] [phone] ([phone]  06/24/16 12:35 PM

## 2016-06-23 NOTE — Patient Instructions (Signed)
     IF you received an x-ray today, you will receive an invoice from [facility]. Please contact [facility] at [phone] with questions or concerns regarding your invoice.   IF you received labwork today, you will receive an invoice from [facility]. Please contact [facility] at [phone] with questions or concerns regarding your invoice.   Our billing staff will not be able to assist you with questions regarding bills from these companies.  You will be contacted with the lab results as soon as they are available. The fastest way to get your results is to activate your My Chart account. Instructions are located on the last page of this paperwork. If you have not heard from us regarding the results in 2 weeks, please contact this office.     

## 2016-06-24 LAB — COMPREHENSIVE METABOLIC PANEL
ALBUMIN: 3.6 g/dL (ref 3.5–5.5)
ALT: 5 IU/L (ref 0–32)
AST: 9 IU/L (ref 0–40)
Albumin/Globulin Ratio: 1.2 (ref 1.2–2.2)
Alkaline Phosphatase: 142 IU/L — ABNORMAL HIGH (ref 39–117)
BILIRUBIN TOTAL: 0.2 mg/dL (ref 0.0–1.2)
BUN / CREAT RATIO: 15 (ref 9–23)
BUN: 26 mg/dL — ABNORMAL HIGH (ref 6–24)
CO2: 29 mmol/L (ref 18–29)
CREATININE: 1.74 mg/dL — AB (ref 0.57–1.00)
Calcium: 9.2 mg/dL (ref 8.7–10.2)
Chloride: 88 mmol/L — ABNORMAL LOW (ref 96–106)
GFR calc non Af Amer: 32 mL/min/{1.73_m2} — ABNORMAL LOW (ref >59)
GFR, EST AFRICAN AMERICAN: 37 mL/min/{1.73_m2} — AB (ref >59)
GLUCOSE: 351 mg/dL — AB (ref 65–99)
Globulin, Total: 3 g/dL (ref 1.5–4.5)
Potassium: 4.6 mmol/L (ref 3.5–5.2)
Sodium: 131 mmol/L — ABNORMAL LOW (ref 134–144)
TOTAL PROTEIN: 6.6 g/dL (ref 6.0–8.5)

## 2016-06-27 ENCOUNTER — Telehealth (HOSPITAL_COMMUNITY): Payer: Self-pay | Admitting: Family Medicine

## 2016-06-27 ENCOUNTER — Encounter (HOSPITAL_COMMUNITY): Payer: Self-pay | Admitting: Family Medicine

## 2016-06-27 NOTE — Telephone Encounter (Signed)
Mailed letter with [facility]iac [facility]hab [facility] along with my chart msg.. KJ  °

## 2016-06-29 LAB — MICROALBUMIN / CREATININE URINE RATIO

## 2016-07-05 ENCOUNTER — Ambulatory Visit (HOSPITAL_COMMUNITY)
Admission: RE | Admit: 2016-07-05 | Discharge: 2016-07-05 | Disposition: A | Discharge status: Home / Self Care | Payer: PPO | Source: Ambulatory Visit | Attending: Family Medicine | Admitting: Family Medicine

## 2016-07-05 ENCOUNTER — Other Ambulatory Visit: Payer: Self-pay | Admitting: Family Medicine

## 2016-07-05 ENCOUNTER — Encounter (HOSPITAL_COMMUNITY): Payer: Self-pay

## 2016-07-05 ENCOUNTER — Ambulatory Visit (HOSPITAL_BASED_OUTPATIENT_CLINIC_OR_DEPARTMENT_OTHER)
Admission: RE | Admit: 2016-07-05 | Discharge: 2016-07-05 | Disposition: A | Discharge status: Home / Self Care | Payer: PPO | Source: Ambulatory Visit

## 2016-07-05 VITALS — BP 130/78 | HR 78 | Wt 192.8 lb

## 2016-07-05 DIAGNOSIS — I5022 Chronic systolic (congestive) heart failure: Secondary | ICD-10-CM | POA: Diagnosis not present

## 2016-07-05 DIAGNOSIS — N183 Chronic kidney disease, stage 3 unspecified: Secondary | ICD-10-CM

## 2016-07-05 DIAGNOSIS — Z720 Tobacco use: Secondary | ICD-10-CM | POA: Diagnosis not present

## 2016-07-05 DIAGNOSIS — E785 Hyperlipidemia, unspecified: Secondary | ICD-10-CM | POA: Insufficient documentation

## 2016-07-05 DIAGNOSIS — I130 Hypertensive heart and chronic kidney disease with heart failure and stage 1 through stage 4 chronic kidney disease, or unspecified chronic kidney disease: Secondary | ICD-10-CM | POA: Insufficient documentation

## 2016-07-05 DIAGNOSIS — I509 Heart failure, unspecified: Secondary | ICD-10-CM | POA: Diagnosis not present

## 2016-07-05 DIAGNOSIS — E1122 Type 2 diabetes mellitus with diabetic chronic kidney disease: Secondary | ICD-10-CM | POA: Insufficient documentation

## 2016-07-05 DIAGNOSIS — I429 Cardiomyopathy, unspecified: Secondary | ICD-10-CM

## 2016-07-05 LAB — CBC
HEMATOCRIT: 38.3 % (ref 36.0–46.0)
Hemoglobin: 12.3 g/dL (ref 12.0–15.0)
MCH: 27.8 pg (ref 26.0–34.0)
MCHC: 32.1 g/dL (ref 30.0–36.0)
MCV: 86.7 fL (ref 78.0–100.0)
PLATELETS: 290 10*3/uL (ref 150–400)
RBC: 4.42 MIL/uL (ref 3.87–5.11)
RDW: 14.5 % (ref 11.5–15.5)
WBC: 10.9 10*3/uL — ABNORMAL HIGH (ref 4.0–10.5)

## 2016-07-05 LAB — BASIC METABOLIC PANEL
Anion gap: 11 (ref 5–15)
BUN: 17 mg/dL (ref 6–20)
CALCIUM: 9.1 mg/dL (ref 8.9–10.3)
CO2: 29 mmol/L (ref 22–32)
CREATININE: 1.23 mg/dL — AB (ref 0.44–1.00)
Chloride: 92 mmol/L — ABNORMAL LOW (ref 101–111)
GFR calc Af Amer: 55 mL/min — ABNORMAL LOW (ref >60)
GFR, EST NON AFRICAN AMERICAN: 47 mL/min — AB (ref >60)
GLUCOSE: 474 mg/dL — AB (ref 65–99)
Potassium: 4.4 mmol/L (ref 3.5–5.1)
Sodium: 132 mmol/L — ABNORMAL LOW (ref 135–145)

## 2016-07-05 MED ORDER — BASAGLAR KWIKPEN 100 UNIT/ML ~~LOC~~ SOPN: 10.0000 [IU] | PEN_INJECTOR | Freq: Every day | SUBCUTANEOUS | 0 refills | Status: DC

## 2016-07-05 MED ORDER — PEN NEEDLES 32G X 4 MM MISC: 1.0000 [IU] | Freq: Every day | 1 refills | Status: DC

## 2016-07-05 NOTE — Progress Notes (Signed)
PCP: Dr Delman Cheadle  Cardiology: Dr Aundra Dubin  HPI: Kaitlin Buckwalter McClintockis a 61 y.o.female with history of COPD, tobacco abuse, DM, HTN, arthritis, and chronic pain syndrome who presented to [facility] with SOB in 8/17.  She had had several months of increasing exertional dyspnea prior to this.  CT angio 12/29/15 showed submassive PE with concerns for right heart strain. Now on Xarelto.  Echo 12/30/15 showed LVEF 20-25%, Grade 3 DD, Mod MR, mod LAE, Mild RV dilation with mildly reduced systolic function, Moderate RAE, Moderate TR. Peak PA pressure 54 mm Hg.  Cardiac MRI (9/17) showed EF 15%, moderate LV dilation, mild RV dilation/moderately decreased RV systolic function, no LGE.  She was diuresed in the hospital and begun on Xarelto.  Smokes 1 ppd (x 42 years).  She has been trying to cut back.  Chantix has not helped much. No ETOH or drug use. No history of cancer or recent surgery, no long car/plane trips prior to her PE.   I stopped her spironolactone due to persistent hyperkalemia in 9/17.    RHC/LHC in 12/17 showed no significant CAD, optimized filling pressures, low but not markedly low cardiac output.   Echo was done today and reviewed.  EF improved to 55% with mild LVH.   Currently stable.  No dyspnea walking on flat ground.  Mildly short of breath with steps or carrying laundary.  No orthopnea/PND.  Weight down 1 lb.  No lightheadedness.  No BRBPR or melena.  No chest pain.   PMH: 1. PE: 12/2015 CT angio 12/29/15 submassive PE with concerns for right heart strain. Now on Xarelto.  2. Cardiomyopathy:  Found 8/17.  - CMRI 01/01/2016: Nonischemic cardiomyopathy.  EF 15%, moderate LV dilation, mild RV dilation/moderately decreased RV systolic function.  No LGE: no infiltrative or inflammatory process noted, no evidence for prior MI. - ECHO  12/30/2015: EF 20-25%. Grade III DD, Mod TR, PASP 54, mildly decreased RV systolic function.  - Hyperkalemia with spironolactone.  - LHC/RHC (12/17): No CAD, mean RA  2, PA 39/13 mean 23, mean PCWP 7, CI 2.1.  - Echo (3/18): EF 55%, mild LVH.  3. ABIs 01/05/2016 normal   4. COPD: Active smoker.  5. Type II diabetes 6. HTN 7. Chronic pain syndrome  Labs (9/17): SPEP negative, HIV negative, K 4.4, creatinine 1.22 Labs (10/17): LDL 111, HDL 32, TSH normal, K 5.2, creatinine 1.49, digoxin 0.7, BNP 245 Labs (12/17): K 4.4, creatinine 1.46 => 1.5, digoxin 0.9 Labs (1/18): K 4.6, creatinine 1.37, digoxin 1.3, LDL 83  ROS: All systems negative except as listed in HPI, PMH and Problem List.  SH:  Social History   Social History  . Marital status: Married    Spouse name: N/A  . Number of children: 0  . Years of education: N/A   Occupational History  . retired    Social History Main Topics  . Smoking status: Current Every Day Smoker    Packs/day: 1.00    Years: 40.00    Types: Cigarettes  . Smokeless tobacco: Never Used  . Alcohol use No  . Drug use: No  . Sexual activity: Not on file   Other Topics Concern  . Not on file   Social History Narrative   Exercise walking daily (varies)    FH:  Family History  Problem Relation Age of Onset  . Colon polyps Father   . Diabetes Father     mother  . Heart disease Father   . Heart murmur  Mother   . Diabetes Mother   . Hypertension Mother   . Diabetes Brother   . Cancer Maternal Grandmother   . Diabetes Paternal Grandmother   . Heart disease Paternal Grandmother   . COPD Paternal Grandfather   . Heart disease Paternal Grandfather   . Hypertension Brother   . Hypertension Brother     Current Outpatient Prescriptions  Medication Sig Dispense Refill  . atorvastatin (LIPITOR) 40 MG tablet TAKE ONE TABLET BY MOUTH ONCE DAILY 90 tablet 0  . blood glucose meter kit and supplies KIT Dispense based on patient and insurance preference. Use up to four times daily as directed. (FOR ICD-9 250.00, 250.01). 1 each 0  . budesonide-formoterol (SYMBICORT) 160-4.5 MCG/ACT inhaler Inhale 1 puff into the  lungs 2 (two) times daily. 1 Inhaler 12  . carvedilol (COREG) 12.5 MG tablet Take 1 tablet (12.5 mg total) by mouth 2 (two) times daily with a meal. 60 tablet 3  . cyclobenzaprine (FLEXERIL) 10 MG tablet TAKE ONE TABLET BY MOUTH THREE TIMES DAILY AS NEEDED FOR MUSCLE SPASMS 270 tablet 0  . diazepam (VALIUM) 5 MG tablet TAKE ONE TABLET BY MOUTH AT BEDTIME AND  MAY  REPEAT  DOSE  ONE  TIME  IF  NEEDED 180 tablet 0  . furosemide (LASIX) 40 MG tablet Take 1 tablet (40 mg total) by mouth every other day. 45 tablet 3  . gabapentin (NEURONTIN) 300 MG capsule Take 1 capsule (300 mg total) by mouth 3 (three) times daily. 270 capsule 1  . glimepiride (AMARYL) 4 MG tablet Take 1 tablet (4 mg total) by mouth 2 (two) times daily. 180 tablet 1  . HYDROcodone-acetaminophen (NORCO) 7.5-325 MG tablet Take 1 tablet by mouth every 8 (eight) hours as needed for moderate pain. 90 tablet 0  . HYDROcodone-acetaminophen (NORCO) 7.5-325 MG tablet Take 1 tablet by mouth every 8 (eight) hours as needed for moderate pain. 90 tablet 0  . HYDROcodone-acetaminophen (NORCO) 7.5-325 MG tablet Take 1 tablet by mouth every 8 (eight) hours as needed for moderate pain. 90 tablet 0  . ipratropium (ATROVENT HFA) 17 MCG/ACT inhaler Inhale 2 puffs into the lungs 4 (four) times daily. 1 Inhaler 12  . Krill Oil CAPS Take 1 capsule by mouth daily.     . ranitidine (ZANTAC) 150 MG tablet TAKE ONE TABLET BY MOUTH TWICE DAILY. 180 tablet 0  . rivaroxaban (XARELTO) 20 MG TABS tablet Take 1 tablet (20 mg total) by mouth daily. 30 tablet   . sacubitril-valsartan (ENTRESTO) 49-51 MG Take 1 tablet by mouth 2 (two) times daily. 60 tablet 6  . traMADol (ULTRAM) 50 MG tablet Take 2 tablets (100 mg total) by mouth every 6 (six) hours as needed for moderate pain. 240 tablet 3  . varenicline (CHANTIX CONTINUING MONTH PAK) 1 MG tablet Take 1 tablet (1 mg total) by mouth 2 (two) times daily. 180 tablet 1  . Insulin Glargine (BASAGLAR KWIKPEN) 100 UNIT/ML  SOPN Inject 0.1 mLs (10 Units total) into the skin daily at 10 pm. 1 pen 0  . Insulin Pen Needle (PEN NEEDLES) 32G X 4 MM MISC 1 Units by Does not apply route daily. 30 each 1   No current facility-administered medications for this encounter.     Vitals:   07/05/16 1152  BP: 130/78  Pulse: 78  SpO2: 98%  Weight: 192 lb 12.8 oz (87.5 kg)    PHYSICAL EXAM:  General:  NAD HEENT: normal Neck: supple. JVP flat. Carotids 2+ bilaterally;  no bruits. No lymphadenopathy or thryomegaly appreciated. Cor: PMI normal. Regular rate & rhythm. No rubs, gallops or murmurs. Lungs: clear Abdomen: soft, nontender, nondistended. No hepatosplenomegaly. No bruits or masses. Good bowel sounds. Extremities: no cyanosis, clubbing, rash, edema. R and LLE varicosities Neuro: alert & orientedx3, cranial nerves grossly intact. Moves all 4 extremities w/o difficulty. Affect pleasant.  ASSESSMENT & PLAN: 1. H/O PE: Submassive on CT.  Etiology uncertain: no known cancer, long trip, recent surgery.  She is fairly active.  Possibly related to stasis in the setting of cardiomyopathy with severely decreased systolic function.  - Given cardiomyopathy and submassive PE with no certain trigger, think she will need long-term anticoagulation.  Continue Xarelto.  CBC today.  2. Chronic systolic CHF: Nonischemic cardiomyopathy.  ECHO 12/2015 EF 15-20% with diffuse hypokinesis.  Uncertain etiology.  No chest pain. Symptoms began as orthopnea around 6/17, then developed progressive exertional dyspnea. ECG shows anteroseptal MI but no change from 2013 ECG. Cardiac MRI did not show LGE. HIV negative, thyroid indices ok (mild TSH elevation but normal T3/T4), SPEP negative.  NYHA class II currently.  12/17 RHC/LHC showed no angiographic CAD, optimized filling pressures, low but not markedly low cardiac output.  Echo 3/18 showed recovery of LV function with EF up to 55%.  NYHA class II symptoms, not volume overloaded.  - Continue Coreg  12.5 mg bid.  - Continue Entresto 49/51 bid.   - Off spironolactone with persistent hyperkalemia.   - Continue Lasix 40 every other day. BMET today.  - She will stop digoxin with improvement in EF.  3. CKD: Stage III.    4. Active smoker: Counseled to quit. Still trying.  Did not find that Chantix helped.  5. Hyperlipidemia: Lipids acceptable in 1/18.   Followup in 6 months.    Loralie Champagne 07/05/2016

## 2016-07-05 NOTE — Patient Instructions (Addendum)
Routine lab work today. Will notify you of abnormal results, otherwise no news is good news!  STOP Digoxin.  Follow up with Dr. Shirlee Latch in 6 months. We will call you closer to this time, or you may call our office to schedule 1 month before you are due to be seen.  Do the following things EVERYDAY: 1) Weigh yourself in the morning before breakfast. Write it down and keep it in a log. 2) Take your medicines as prescribed 3) Eat low salt foods-Limit salt (sodium) to 2000 mg per day.  4) Stay as active as you can everyday 5) Limit all fluids for the day to less than 2 liters

## 2016-07-06 ENCOUNTER — Telehealth: Payer: Self-pay | Admitting: Emergency Medicine

## 2016-07-06 NOTE — Telephone Encounter (Signed)
-----   Message from Sherren Mocha, MD sent at 07/05/2016  4:21 PM EST ----- PLease call pt to make sure she gets below mychart message. She needs to start on insulin and see me this Thurs or Sat. Please make sure pt knows how to use an insulin pen. If she could bring her insurance formulary with her to the visit if she has one, that'd be great.  Kaitlin Cantrell -    Dr. Shirlee Latch forwarded me your labs you had drawn today as your blood sugar is the highest yet.  We need to start you on additional medication before you end up in the hospital again.  I think we should do insulin as I know we are hoping your sugars are going to get much better as you continue to recover and are able to increase your activity again and insulin is the easiest and quickest to get your sugars under control now and then be able to taper off as your diet improves.  Alternatively, I would be happy to refer you to endocrinology for more specialized care but it is normally several weeks to months before you can get an appointment and I worry that you will be back in the hospital again with blood sugar if we wait for that long.  I have sent an insulin pen to your pharmacy for you to start. Please make an appointment to see me asap - Thursday 3/8 or Sat 3/10 to make sure you are able to use the insulin and talk about dosing. Dr. Clelia Croft

## 2016-07-07 ENCOUNTER — Other Ambulatory Visit: Payer: Self-pay | Admitting: Family Medicine

## 2016-07-07 ENCOUNTER — Ambulatory Visit: Payer: PPO | Admitting: Family Medicine

## 2016-07-07 ENCOUNTER — Telehealth: Payer: Self-pay

## 2016-07-07 MED ORDER — INSULIN GLARGINE 100 UNIT/ML SOLOSTAR PEN: 10.0000 [IU] | PEN_INJECTOR | Freq: Every day | SUBCUTANEOUS | 99 refills | Status: DC

## 2016-07-07 NOTE — Telephone Encounter (Signed)
Needs PA for basaglar insulin

## 2016-07-08 ENCOUNTER — Other Ambulatory Visit: Payer: Self-pay | Admitting: Internal Medicine

## 2016-07-09 ENCOUNTER — Ambulatory Visit (INDEPENDENT_AMBULATORY_CARE_PROVIDER_SITE_OTHER): Payer: PPO | Admitting: Family Medicine

## 2016-07-09 VITALS — BP 92/54 | HR 83 | Temp 98.1°F | Ht 66.0 in | Wt 192.4 lb

## 2016-07-09 DIAGNOSIS — R739 Hyperglycemia, unspecified: Secondary | ICD-10-CM | POA: Diagnosis not present

## 2016-07-09 LAB — GLUCOSE, POCT (MANUAL RESULT ENTRY): POC GLUCOSE: 330 mg/dL — AB (ref 70–99)

## 2016-07-09 MED ORDER — ATORVASTATIN CALCIUM 40 MG PO TABS: 40.0000 mg | ORAL_TABLET | Freq: Every day | ORAL | 0 refills | Status: DC

## 2016-07-09 MED ORDER — RIVAROXABAN 20 MG PO TABS: 20.0000 mg | ORAL_TABLET | Freq: Every day | ORAL | 0 refills | Status: DC

## 2016-07-09 NOTE — Telephone Encounter (Signed)
Insulin changed to Lantus pen

## 2016-07-09 NOTE — Patient Instructions (Addendum)
IF you received an x-ray today, you will receive an invoice from [facility]. Please contact [facility] at [phone] with questions or concerns regarding your invoice.   IF you received labwork today, you will receive an invoice from [facility]. Please contact [facility] at [phone] with questions or concerns regarding your invoice.   Our billing staff will not be able to assist you with questions regarding bills from these companies.  You will be contacted with the lab results as soon as they are available. The fastest way to get your results is to activate your My Chart account. Instructions are located on the last page of this paperwork. If you have not heard from [place] regarding the results in 2 weeks, please contact this office.      How and Where to Give Subcutaneous Insulin Injections, Adult People with type 1 diabetes must take insulin since their bodies do not make it. People with type 2 diabetes may require insulin. There are many different types of insulin as well as other injectable diabetes medicines that are meant to be injected into the fat layer under your skin. The type of insulin or injectable diabetes medicine you take may determine how many injections you give yourself and when to take the injections. Choosing a site for injection Insulin absorption varies from site to site. As with any injectable medication it is best for the insulin to be injected within the same body region. However, do not inject the insulin in the same spot each time. Rotating the spots you give your injections will prevent inflammation or tissue breakdown. There are four main regions that can be used for injections. The regions include the:  Abdomen (preferred region, especially for non-insulin injectable diabetes medicine).  Front and upper outer sides of thighs.  Back of upper arm.  Buttocks. Using a syringe and vial Drawing up insulin: single insulin dose   1. Wash  your hands with soap and water. 2. Gently roll the insulin bottle (vial) between your hands to mix it. Do not shake the vial. 3. Clean the top rubber part of the vial with an alcohol wipe. Be sure that the plastic pop-top has been removed on newer vials. 4. Remove the plastic cover from the needle on the syringe. Do not let the needle touch anything. 5. Pull the plunger back to draw air into the syringe. The air should be the same amount as the insulin dose. 6. Push the needle through the rubber on the top of the vial. Do not turn the vial over. 7. Push the plunger in all the way to put the air into the vial. 8. Leave the needle in the vial and turn the vial and syringe upside down. 9. Pull down slowly on the plunger, drawing the amount of insulin you need into the syringe. 10. Look for air bubbles in the syringe. You may need to push the plunger up and down 2 to 3 times to slowly get rid of any air bubbles in the syringe. 11. Pull back the plunger to get your correct dose. 12. Remove the needle from the vial. 13. Use an alcohol wipe to clean the area of the body to be injected. 14. Pinch up 1 inch of skin and hold it. 15. Put the needle straight into the skin (90-degree angle). Put the needle in as far as it will go (to the hub). The needle may need to be injected at a 45-degree angle in small adults with little  fat. 16. When the needle is in, you can let go of your skin. 17. Push the plunger down all the way to inject the insulin. 18. Pull the needle straight out of the skin. 19. Press the alcohol wipe over the spot where you gave your injection. Keep it there for a few seconds. Do not rub the area. 20. Do not put the plastic cover back on the needle. Drawing up insulin: mixing 2 insulins  1. Wash your hands with soap and water. 2. Gently roll the vial of "cloudy" insulin between your hands or rotate the vial from top to bottom to mix. 3. Clean the top of both vials with an alcohol wipe. Be  sure that the plastic pop-top lid has been removed on newer vials. 4. Pull air into the syringe to equal the dose of "cloudy" insulin. 5. Stick the needle into the "cloudy" insulin vial and inject the air. Be sure to keep the vial upright. 6. Remove the needle from the "cloudy" insulin vial. 7. Pull air into the syringe to equal the dose of "clear" insulin. 8. Stick the needle into the "clear" insulin vial and inject the air. 9. Leave the needle in the "clear" insulin vial and turn the vial upside down. 10. Pull down on the plunger and slowly draw into the syringe the number of units of "clear" insulin desired. 11. Look for air bubbles in the syringe. You may need to push the plunger up and down 2 to 3 times to slowly get rid of any air bubbles in the syringe. 12. Remove the needle from the "clear" insulin vial. 13. Stick the needle into the "cloudy" insulin vial. Do not inject any of the "clear" insulin into the "cloudy" vial. 14. Turn the "cloudy" vial upside down and pull the plunger down to the number of units that equals the total number of units of "clear" and "cloudy" insulins. 15. Remove the needle from the "cloudy" insulin vial. 16. Use an alcohol wipe to clean the area of the body to be injected. 17. Put the needle straight into the skin (90-degree angle). Put the needle in as far as it will go (to the hub). The needle may need to be injected at a 45-degree angle in small adults with little fat. 18. When the needle is in, you can let go of your skin. 19. Push the plunger down all the way to inject the insulin. 20. Pull the needle straight out of the skin. 21. Press the alcohol wipe over the spot where you gave your injection. Keep it there for a few seconds. Do not rub the area. 22. Do not put the plastic cover back on the needle. Using an insulin pen  1. Wash your hands with soap and water. 2. If you are using the "cloudy" insulin, roll the pen between your palms several times or  rotate the pen top to bottom several times. 3. Remove the insulin pen cap. 4. Clean the rubber stopper of the cartridge with an alcohol wipe. 5. Remove the protective paper tab from the disposable needle. 6. Screw the needle onto the pen. 7. Remove the outer plastic needle cover. 8. Remove the inner plastic needle cover. 9. Prime the insulin pen by turning the button (dial) to 2 units. Hold the pen with the needle pointing up, and push the dial on the opposite end until a drop of insulin appears at the needle tip. If no insulin appears, repeat this step. 10. Dial the number of  units of insulin you will inject. 11. Use an alcohol wipe to clean the area of the body to be injected. 12. Pinch up 1 inch of skin and hold it. 13. Put the needle straight into the skin (90-degree angle). 14. Push the dial down to push the insulin into the fat tissue. 15. Count to 10 slowly. Then, remove the needle from the fat tissue. 16. Carefully replace the larger outer plastic needle cover over the needle and unscrew the capped needle. Throwing away supplies  Discard used needles in a puncture proof sharps disposal container. Follow disposal regulations for the area where you live.  Vials and empty disposable pens may be thrown away in the regular trash. This information is not intended to replace advice given to you by your health care provider. Make sure you discuss any questions you have with your health care provider. Document Released: 07/09/2003 Document Revised: 09/24/2015 Document Reviewed: 09/25/2012 Elsevier Interactive Patient Education  2017 Elsevier Inc.  Insulin Treatment for Diabetes Diabetes (diabetes mellitus) is a long-term (chronic) disease. It occurs when the body does not properly use sugar (glucose) that is released from food after digestion. Glucose levels are controlled by a hormone called insulin, which is made in the pancreas.  If you have type 1 diabetes, the pancreas does not make  any insulin, so you must take insulin.  If you have type 2 diabetes, you might need to take insulin along with other medicines. In type 2 diabetes, one or both of these problems may be present:  The pancreas does not make enough insulin.  Cells in the body do not respond properly to insulin that the body makes (insulin resistance). You must use insulin correctly to control your diabetes. You must have some insulin in your body at all times. Insulin treatment varies depending on your type of diabetes, your treatment goals, and your medical history. It is important for you to understand your insulin treatment plan so you can be an active partner in managing your diabetes. How is insulin given? Insulin can only be given through a shot (injection). It is injected using a syringe and needle, an insulin pen, a pump, or a jet injector. Your health care provider will:  Prescribe the amount and type of insulin that you need.  Tell you when you should inject your insulin. Where on the body should insulin be injected? Insulin is injected into a layer of fatty tissue under the skin. Good places to inject insulin include:  Abdomen. Generally, the abdomen is the best place to inject insulin. However, you should avoid any area that is less than 2 inches (5 cm) from the belly button (navel).  Front and outer area of the upper thighs.  The back of the upper arms.  Upper buttocks. It is important to:  Give your injection in a slightly different place each time. This helps to prevent irritation and improve absorption.  Avoid injecting into areas that have scar tissue. Usually, you will give yourself insulin injections. Others can also be taught how to give you injections. You will use a special type of syringe that is made only for insulin. Some people may have an insulin pump that delivers insulin steadily through a tube (cannula) that is placed under the skin. What are the different types of  insulin? The following information is a general guide to different types of insulin. Specifics vary depending on the insulin product that your health care provider prescribes.  Rapid-acting insulin:  Starts working  quickly, in as little as 5 minutes.  Can last for 4-6 hours, or sometimes longer.  Works well when taken right before a meal to quickly lower blood glucose.  Short-acting insulin:  Starts working in about 30 minutes.  Can last for 6-10 hours.  Should be taken about 30 minutes before you start eating a meal.  Intermediate-acting insulin:  Starts working in 1-2 hours.  Lasts for about 10-18 hours.  Lowers your blood glucose for a longer period of time but is not as effective for lowering blood glucose right after a meal.  Long-acting insulin:  Mimics the small amount of insulin that your pancreas usually produces throughout the day.  Should be used either one or two times a day.  Is usually used in combination with other types of insulin or other medicines.  Concentrated insulin, or U-500 insulin:  Contains a higher dose of insulin than most rapid-acting insulins. U-500 insulin has 5 times the amount of insulin per 1 mL.  Should only be used with the special U-500 syringe or U-500 insulin pen. It is dangerous to use the wrong type of syringe with this insulin. What are the side effects of insulin? Possible side effects of insulin treatment include:  Low blood glucose (hypoglycemia).  Weight gain.  High blood glucose (hyperglycemia).  Skin injury or irritation. Some of these side effects can be caused by using improper injection technique. It is important to learn to inject insulin properly. What are common terms associated with insulin treatment? Some terms that you might hear include:  Basal insulin, or basal rate. This is the constant amount of insulin that needs to be present in your body to stabilize your blood glucose levels. People who have type 1  diabetes need basal insulin in a steady (continuous) dose 24 hours a day.  Usually, intermediate-acting or long-acting insulin is used one or two times a day to manage basal insulin levels.  Medicines that are taken by mouth may also be recommended to manage basal insulin levels.  Prandial insulin. This refers to meal-related insulin.  Blood glucose rises quickly after a meal (postprandial). Rapid-acting or short-acting insulin can be used right before a meal (preprandial) to quickly lower blood glucose.  You may be instructed to adjust the amount of prandial insulin that you take depending on how much carbohydrate (starch) is in your meal.  Corrective insulin. This may also be called a correction dose or supplemental dose. This is a small amount of rapid-acting or short-acting insulin that can be used to lower blood glucose if it is too high. You may be instructed to check your blood glucose at certain times of the day and use corrective insulin as needed.  Tight control, or intensive therapy. This means keeping your blood glucose as close to your target as possible, and preventing it from getting too high after meals. People who have tight control of their diabetes have fewer long-term problems caused by diabetes. General instructions   Talk with your health care provider or pharmacist about the type of insulin you should take and when you should take it. You should know when your insulin peaks and when it wears off. You need this information so you can plan your meals and exercise. You also need to work with your health care provider to:  Check your blood glucose every day. Your health care provider will tell you how often and when you should do this.  Manage your:  Weight.  Blood pressure.  Cholesterol.  Stress.  Eat a healthy diet.  Exercise regularly. This information is not intended to replace advice given to you by your health care provider. Make sure you discuss any  questions you have with your health care provider. Document Released: 07/15/2008 Document Revised: 09/24/2015 Document Reviewed: 05/22/2015 Elsevier Interactive Patient Education  2017 Elsevier Inc.  Insulin Storage and Care All insulin pens and bottles (vials) have expiration dates. Refrigerated, unopened insulin pens, insulin cartridges, and insulin vials are good until the expiration date. Do not use insulin after this date. Once insulin is opened, it should be used within a certain time period. Opened means once the rubber is punctured. Always follow the instructions that come with your insulin. Storing and caring for your insulin  If insulin is kept at room temperature, the temperature must be less than 65F (30C). Some types of insulin can be stored only at less than 65F (25C).  If insulin is kept in the refrigerator, the temperature must be between 15F and 55F (3C and 8C).  Do not freeze insulin.  Keep insulin away from direct heat or sunlight.  Throw away the insulin if it is discolored, thick, or has clumps or suspended white particles in it.  Be sure to mix cloudy insulin by rolling between your hands gently. Pens can be "rocked" from end to end.  Opened insulin pens should be kept at room temperature.  Always have extra insulin on hand.  Never leave insulin in your vehicle. This information is not intended to replace advice given to you by your health care provider. Make sure you discuss any questions you have with your health care provider. Document Released: 02/13/2009 Document Revised: 09/24/2015 Document Reviewed: 09/06/2012 Elsevier Interactive Patient Education  2017 [employer].

## 2016-07-09 NOTE — Progress Notes (Signed)
Subjective:  By signing my name below, I, [name], attest that this documentation has been prepared under the direction and in the presence of Delman Cheadle, MD Electronically Signed: Ladene Artist, ED Scribe 07/09/2016 at 3:29 PM.   Patient ID: Kaitlin Cantrell, female    DOB: 03-25-1956, 61 y.o.   MRN: [id number]  Chief Complaint  Patient presents with  . Diabetes    diabetes check    HPI Kaitlin Cantrell is a 61 y.o. female who presents to Primary Care at [facility]. Pt developed CHF after large PE which caused acute cor pulmonale causing significant dyspnea and decline in physical activity. She is hoping to start cardiac rehab but is having trouble with copays of office visits and many medications. Had a decline in renal function. After CHF, we were forced to stop metformin. Cessation of metformin and decrease of mobility led to a precipitous rise in blood glucose which changed from A1Cs of approximately 6.5-7 to now 12.1 with blood sugar steadily running in the 400s. At last visit 2 weeks ago, I advised pt to start insulin as she was unable to afford copays on the newer brand name diabetic medications however she admitted she had been drinking a lot of soda and felt strongly that she would be able to make some excellent dietary changes. However, earlier this week she was seen at cardiology, still noted to have CBG in 400s so I recommended she start Lantus daily and she is here today to discuss this.   Pt admits that she does not check her blood glucose regularly but agrees to start recording both fasting and non-fasting readings. States that she currently only checks it prior to office visits. She states that she has cut back on her soda intake and substituted them for water. Pt reports intermittent polydipsia, states that she experiences this every other day. She denies loss of appetite.   Smoking Cessation Pt has also stopped Chantix; states that she did not feel a difference. However, she  states that she did not increase her amount of cigarettes when she stopped. Pt plans to pick back up when she is motivated to quit.  Past Medical History:  Diagnosis Date  . Allergy    seasonal  . Arthritis   . Diabetes mellitus   . GERD (gastroesophageal reflux disease)   . History of pneumonia    15 years ago  . Hyperlipidemia   . Hypertension   . PE (pulmonary embolism) 12/2015   Current Outpatient Prescriptions on File Prior to Visit  Medication Sig Dispense Refill  . atorvastatin (LIPITOR) 40 MG tablet TAKE ONE TABLET BY MOUTH ONCE DAILY 90 tablet 0  . blood glucose meter kit and supplies KIT Dispense based on patient and insurance preference. Use up to four times daily as directed. (FOR ICD-9 250.00, 250.01). 1 each 0  . budesonide-formoterol (SYMBICORT) 160-4.5 MCG/ACT inhaler Inhale 1 puff into the lungs 2 (two) times daily. 1 Inhaler 12  . carvedilol (COREG) 12.5 MG tablet Take 1 tablet (12.5 mg total) by mouth 2 (two) times daily with a meal. 60 tablet 3  . cyclobenzaprine (FLEXERIL) 10 MG tablet TAKE ONE TABLET BY MOUTH THREE TIMES DAILY AS NEEDED FOR MUSCLE SPASMS 270 tablet 0  . diazepam (VALIUM) 5 MG tablet TAKE ONE TABLET BY MOUTH AT BEDTIME AND  MAY  REPEAT  DOSE  ONE  TIME  IF  NEEDED 180 tablet 0  . furosemide (LASIX) 40 MG tablet Take 1 tablet (40  mg total) by mouth every other day. 45 tablet 3  . gabapentin (NEURONTIN) 300 MG capsule Take 1 capsule (300 mg total) by mouth 3 (three) times daily. 270 capsule 1  . glimepiride (AMARYL) 4 MG tablet Take 1 tablet (4 mg total) by mouth 2 (two) times daily. 180 tablet 1  . HYDROcodone-acetaminophen (NORCO) 7.5-325 MG tablet Take 1 tablet by mouth every 8 (eight) hours as needed for moderate pain. 90 tablet 0  . HYDROcodone-acetaminophen (NORCO) 7.5-325 MG tablet Take 1 tablet by mouth every 8 (eight) hours as needed for moderate pain. 90 tablet 0  . HYDROcodone-acetaminophen (NORCO) 7.5-325 MG tablet Take 1 tablet by mouth  every 8 (eight) hours as needed for moderate pain. 90 tablet 0  . Insulin Glargine (LANTUS SOLOSTAR) 100 UNIT/ML Solostar Pen Inject 10 Units into the skin daily at 10 pm. Titrate as directed by physician based on blood sugar response 3 pen PRN  . Insulin Pen Needle (PEN NEEDLES) 32G X 4 MM MISC 1 Units by Does not apply route daily. 30 each 1  . ipratropium (ATROVENT HFA) 17 MCG/ACT inhaler Inhale 2 puffs into the lungs 4 (four) times daily. 1 Inhaler 12  . Krill Oil CAPS Take 1 capsule by mouth daily.     . ranitidine (ZANTAC) 150 MG tablet TAKE ONE TABLET BY MOUTH TWICE DAILY. 180 tablet 0  . rivaroxaban (XARELTO) 20 MG TABS tablet Take 1 tablet (20 mg total) by mouth daily. 30 tablet   . sacubitril-valsartan (ENTRESTO) 49-51 MG Take 1 tablet by mouth 2 (two) times daily. 60 tablet 6  . traMADol (ULTRAM) 50 MG tablet Take 2 tablets (100 mg total) by mouth every 6 (six) hours as needed for moderate pain. 240 tablet 3  . varenicline (CHANTIX CONTINUING MONTH PAK) 1 MG tablet Take 1 tablet (1 mg total) by mouth 2 (two) times daily. 180 tablet 1   No current facility-administered medications on file prior to visit.    Allergies  Allergen Reactions  . Albuterol Anaphylaxis    Throat/lips swelling  . Morphine And Related Other (See Comments)    Very aggressive and doesn't help pain   Review of Systems  Constitutional: Negative for appetite change.  Endocrine: Positive for polydipsia (intermittent).      Objective:   Physical Exam  Constitutional: She is oriented to person, place, and time. She appears well-developed and well-nourished. No distress.  HENT:  Head: Normocephalic and atraumatic.  Eyes: Conjunctivae and EOM are normal.  Neck: Neck supple. No tracheal deviation present.  Cardiovascular: Normal rate, regular rhythm, S1 normal, S2 normal and normal heart sounds.   Pulmonary/Chest: Effort normal and breath sounds normal. No respiratory distress.  Lungs are clear to  auscultation.  Musculoskeletal: Normal range of motion.  Neurological: She is alert and oriented to person, place, and time.  Skin: Skin is warm and dry.  Psychiatric: She has a normal mood and affect. Her behavior is normal.  Nursing note and vitals reviewed.  BP (!) 92/54 (BP Location: Right Arm, Patient Position: Sitting, Cuff Size: Small)   Pulse 83   Temp 98.1 F (36.7 C) (Oral)   Ht '5\' 6"'  (1.676 m)   Wt 192 lb 6.4 oz (87.3 kg)   SpO2 97%   BMI 31.05 kg/m     Assessment & Plan:  Pt states she did not get her pneumonia vaccine at her last visit but records state she was given pneumovax-23 on 06/23/16. 1. Hyperglycemia   Start lantus 10u  Orders Placed This Encounter  Procedures  . Care order/instruction:    AVS and GO    Scheduling Instructions:     AVS and GO  . POCT glucose (manual entry)    Meds ordered this encounter  Medications  . rivaroxaban (XARELTO) 20 MG TABS tablet    Sig: Take 1 tablet (20 mg total) by mouth daily.    Dispense:  30 tablet    Refill:  0  . atorvastatin (LIPITOR) 40 MG tablet    Sig: Take 1 tablet (40 mg total) by mouth daily.    Dispense:  90 tablet    Refill:  0    Office visit needed for refills    I personally performed the services described in this documentation, which was scribed in my presence. The recorded information has been reviewed and considered, and addended by me as needed.   Delman Cheadle, M.D.  Primary Care at [facility] [address] [place], [place] [address] [phone] phone [phone] fax  07/09/16 11:21 PM

## 2016-07-13 ENCOUNTER — Other Ambulatory Visit: Payer: Self-pay | Admitting: Family Medicine

## 2016-07-13 MED ORDER — BLOOD GLUCOSE MONITOR KIT: PACK | 0 refills | Status: DC

## 2016-07-13 NOTE — Telephone Encounter (Signed)
Pt says that she cant find her meter or her strips & needs a new one.   Please Advise

## 2016-07-13 NOTE — Telephone Encounter (Signed)
Yes, that's fine. Please call into pharmacy.

## 2016-07-15 ENCOUNTER — Other Ambulatory Visit (HOSPITAL_COMMUNITY): Payer: Self-pay | Admitting: Cardiology

## 2016-07-15 MED ORDER — RIVAROXABAN 20 MG PO TABS: 20.0000 mg | ORAL_TABLET | Freq: Every day | ORAL | 3 refills | Status: DC

## 2016-07-19 ENCOUNTER — Other Ambulatory Visit (HOSPITAL_COMMUNITY): Payer: Self-pay | Admitting: Pharmacist

## 2016-07-19 MED ORDER — RIVAROXABAN 20 MG PO TABS
20.0000 mg | ORAL_TABLET | Freq: Every day | ORAL | 3 refills | Status: DC
Start: 2016-07-19 — End: 2017-08-08

## 2016-07-20 NOTE — Telephone Encounter (Signed)
Called to [facility] 

## 2016-07-21 ENCOUNTER — Telehealth: Payer: Self-pay | Admitting: Family Medicine

## 2016-07-21 DIAGNOSIS — N183 Chronic kidney disease, stage 3 unspecified: Secondary | ICD-10-CM

## 2016-07-21 DIAGNOSIS — Z5181 Encounter for therapeutic drug level monitoring: Secondary | ICD-10-CM

## 2016-07-21 NOTE — Telephone Encounter (Signed)
Pt is wanting to discuss going back on metformin  Best number [phone]

## 2016-07-23 MED ORDER — METFORMIN HCL 1000 MG PO TABS: 1000.0000 mg | ORAL_TABLET | Freq: Two times a day (BID) | ORAL | 0 refills | Status: DC

## 2016-07-23 NOTE — Telephone Encounter (Signed)
Blood sugar is still high  Am 227-297  pc 300-400  16 units daily on insulin now  Managing diet as well, feels any food causes spikes in sugar.  Trying to be more active as well.    Fingers hurt from testing all the time.

## 2016-07-23 NOTE — Telephone Encounter (Signed)
With her last kidney test 2 weeks ago it would be safe to restart on metformin but she is borderline.  I would like her to stay on insulin and reintroduce metformin. Hopefully as it kicks in, we can wean her off the insulin. However, we do need to keep a VERY close eye on her kidneys so please have appt with me within 1 week of starting metformin.  If she is doing well, ok ot come in for lab only visit instead for repeat bmp (lab orders entered.) I would like her to restart metformin 500mg  (1/2 tab) twice a day for 3d then go up to 1 tab twice a day as long as nausea/diarrhea aren't to bad.  I would encourage her to stay on the lantus 16u initially then whenever her a.m. Sugars are dropping <150, she can go down by 2u the next day. If the next morning her cbgs is again <150, go down by another 2, etc.  Can stop checking sugars except in a.m. for now. Cont glimepiride (amaryl) 8 mg qd.  Called pt to advise of above info - 17 min conversation.  Pt understands and agrees.

## 2016-08-02 ENCOUNTER — Other Ambulatory Visit: Payer: PPO

## 2016-08-02 DIAGNOSIS — N183 Chronic kidney disease, stage 3 unspecified: Secondary | ICD-10-CM

## 2016-08-02 DIAGNOSIS — Z5181 Encounter for therapeutic drug level monitoring: Secondary | ICD-10-CM

## 2016-08-03 ENCOUNTER — Encounter: Payer: Self-pay | Admitting: Family Medicine

## 2016-08-03 LAB — BASIC METABOLIC PANEL
BUN/Creatinine Ratio: 18 (ref 9–23)
BUN: 22 mg/dL (ref 6–24)
CALCIUM: 9.3 mg/dL (ref 8.7–10.2)
CO2: 28 mmol/L (ref 18–29)
Chloride: 93 mmol/L — ABNORMAL LOW (ref 96–106)
Creatinine, Ser: 1.24 mg/dL — ABNORMAL HIGH (ref 0.57–1.00)
GFR, EST AFRICAN AMERICAN: 55 mL/min/{1.73_m2} — AB (ref >59)
GFR, EST NON AFRICAN AMERICAN: 48 mL/min/{1.73_m2} — AB (ref >59)
Glucose: 207 mg/dL — ABNORMAL HIGH (ref 65–99)
POTASSIUM: 4.9 mmol/L (ref 3.5–5.2)
SODIUM: 137 mmol/L (ref 134–144)

## 2016-08-05 ENCOUNTER — Other Ambulatory Visit: Payer: Self-pay | Admitting: Family Medicine

## 2016-09-11 ENCOUNTER — Other Ambulatory Visit: Payer: Self-pay | Admitting: Family Medicine

## 2016-09-13 NOTE — Telephone Encounter (Signed)
Pt is not due for valium refill until 10/30/16 - she is 6 wks to early. . .  Will address when I am in the office Fri 5/18

## 2016-09-14 ENCOUNTER — Other Ambulatory Visit: Payer: Self-pay | Admitting: Family Medicine

## 2016-09-14 NOTE — Telephone Encounter (Signed)
Pt states she has called all her other meds in,but needs Her hydrochodone printed to be picked up   Best phone [phone]

## 2016-09-15 NOTE — Telephone Encounter (Signed)
06/23/16 last refills x 3  06/2016 last ov

## 2016-09-17 ENCOUNTER — Encounter: Payer: Self-pay | Admitting: Family Medicine

## 2016-09-17 MED ORDER — HYDROCODONE-ACETAMINOPHEN 7.5-325 MG PO TABS: 1.0000 | ORAL_TABLET | Freq: Three times a day (TID) | ORAL | 0 refills | Status: DC | PRN

## 2016-09-17 NOTE — Telephone Encounter (Signed)
Pt has appt for refills 5/31

## 2016-09-17 NOTE — Telephone Encounter (Signed)
Pt has appt 5/31 for refills

## 2016-09-17 NOTE — Telephone Encounter (Signed)
L/m rx up front for pick up 

## 2016-09-20 DIAGNOSIS — T25222A Burn of second degree of left foot, initial encounter: Secondary | ICD-10-CM | POA: Diagnosis not present

## 2016-09-20 DIAGNOSIS — T25221A Burn of second degree of right foot, initial encounter: Secondary | ICD-10-CM | POA: Diagnosis not present

## 2016-09-22 ENCOUNTER — Ambulatory Visit: Payer: PPO | Admitting: Family Medicine

## 2016-09-29 ENCOUNTER — Ambulatory Visit (INDEPENDENT_AMBULATORY_CARE_PROVIDER_SITE_OTHER): Payer: PPO | Admitting: Family Medicine

## 2016-09-29 ENCOUNTER — Encounter: Payer: Self-pay | Admitting: Family Medicine

## 2016-09-29 VITALS — BP 110/73 | HR 71 | Temp 97.5°F | Resp 18 | Ht 65.91 in | Wt 204.6 lb

## 2016-09-29 DIAGNOSIS — J449 Chronic obstructive pulmonary disease, unspecified: Secondary | ICD-10-CM

## 2016-09-29 DIAGNOSIS — E119 Type 2 diabetes mellitus without complications: Secondary | ICD-10-CM | POA: Diagnosis not present

## 2016-09-29 DIAGNOSIS — I10 Essential (primary) hypertension: Secondary | ICD-10-CM

## 2016-09-29 DIAGNOSIS — M1990 Unspecified osteoarthritis, unspecified site: Secondary | ICD-10-CM | POA: Diagnosis not present

## 2016-09-29 DIAGNOSIS — Z720 Tobacco use: Secondary | ICD-10-CM

## 2016-09-29 DIAGNOSIS — E785 Hyperlipidemia, unspecified: Secondary | ICD-10-CM | POA: Diagnosis not present

## 2016-09-29 DIAGNOSIS — I5041 Acute combined systolic (congestive) and diastolic (congestive) heart failure: Secondary | ICD-10-CM

## 2016-09-29 DIAGNOSIS — Z79899 Other long term (current) drug therapy: Secondary | ICD-10-CM

## 2016-09-29 DIAGNOSIS — G894 Chronic pain syndrome: Secondary | ICD-10-CM

## 2016-09-29 LAB — POCT GLYCOSYLATED HEMOGLOBIN (HGB A1C): Hemoglobin A1C: 7.8

## 2016-09-29 MED ORDER — TRAMADOL HCL 50 MG PO TABS: 100.0000 mg | ORAL_TABLET | Freq: Four times a day (QID) | ORAL | 2 refills | Status: DC | PRN

## 2016-09-29 MED ORDER — HYDROCODONE-ACETAMINOPHEN 7.5-325 MG PO TABS: 1.0000 | ORAL_TABLET | Freq: Three times a day (TID) | ORAL | 0 refills | Status: DC | PRN

## 2016-09-29 NOTE — Progress Notes (Addendum)
Subjective:    Patient ID: Kaitlin Cantrell, female    DOB: [birth year], 61 y.o.   MRN: [id number] Chief Complaint  Patient presents with  . Diabetes    [id number]/[id number]    HPI  [name] a delightful 61yo woman here for a 3 mo follow-up on her chronic medical conditions including diabetes mellitus for which we recently tried switching her to insulin as well as chronic pain syndrome. New teeth so could eat corn.  DM: Optho exam 03/30/16 with Dr. RClent Jackswas normal/neg. Foot exam done 06/23/16. Urine microalb was done 06/23/16 but [facility] lost the specimen.  On metformin and amaryl and tolerating well. Using lantus prn 10u about 4-5x/wk. She did go over 200 one day. Most have been around 100-130. No lows.  90 is the lowest cbg she has seen.  Lab Results  Component Value Date   HGBA1C 12.1 06/23/2016   HGBA1C 7.5 (H) 02/04/2016   HGBA1C 6.9 10/01/2015   HGBA1C 7.1 (H) 05/14/2015   HGBA1C 6.5 01/08/2015     Pt developed CHF after large PE which caused acute cor pulmonale causing significant dyspnea and decline in physical activity. She was wanting to do cardiac rehab but couldn't afford the copays since there were so many office visits and medications as well.  HLD goalLDL<70. Was 5959 mos ago.  Added a little sodium back to her diet as her bp was low and no edema and not noticed any recurrence of the edema.   Tobacco abuse: did try the chantix but wasn't the right time so she still has some but can't affo5rd more right now as in donut hole f0or another $2000. Slightly less 59than 1 ppd.  Past Medical History:  Diagnosis Date  . Allergy    seasonal  . Arthritis   . Diabetes mellitus   . GERD (gastroesophageal reflux disease)   . History of pneumonia    15 years ago  . Hyperlipidemia   . Hypertension   . PE (pulmonary embolism) 12/2015   Past Surgical History:  Procedure Laterality Date  . ANKLE FRACTURE SURGERY  1974   left and pinned then pins removed  . ANKLE GANGLION CYST  EXCISION  1994   right  . BACK SURGERY  1998   discectomy and then fusion  . CARDIAC CATHETERIZATION N/A 04/05/2016   Procedure: Right/Left Heart Cath and Coronary Angiography;  Surgeon: DLarey Dresser MD;  [facility];  Service: Cardiovascular;  Laterality: N/A;  . KNEE ARTHROPLASTY  02/14/2012   Procedure: COMPUTER ASSISTED TOTAL KNEE ARTHROPLASTY;  Surgeon: GMeredith Pel MD;  [facility]  Service: Orthopedics;  Laterality: Left;  Left total knee arthroplasty  . TONSILLECTOMY  1972  . TUBAL LIGATION  1996  . UMBILICAL HERNIA REPAIR  1996   Current Outpatient Prescriptions on File Prior to Visit  Medication Sig Dispense Refill  . atorvastatin (LIPITOR) 40 MG tablet Take 1 tablet (40 mg total) by mouth daily. 90 tablet 0  . blood glucose meter kit and supplies KIT Dispense based on patient and insurance preference. Use up to four times daily as directed. (FOR ICD-9 250.00, 250.01). 1 each 0  . budesonide-formoterol (SYMBICORT) 160-4.5 MCG/ACT inhaler Inhale 1 puff into the lungs 2 (two) times daily. 1 Inhaler 12  . carvedilol (COREG) 12.5 MG tablet Take 1 tablet (12.5 mg total) by mouth 2 (two) times daily with a meal. 60 tablet 3  . diazepam (VALIUM) 5 MG tablet TAKE  1 TABLET BY MOUTH AT BEDTIME AND MAY REPEAT DOSE 1 TIME IF NEEDED 60 tablet 0  . furosemide (LASIX) 40 MG tablet Take 1 tablet (40 mg total) by mouth every other day. 45 tablet 3  . gabapentin (NEURONTIN) 300 MG capsule TAKE ONE CAPSULE BY MOUTH THREE TIMES DAILY 270 capsule 1  . glimepiride (AMARYL) 4 MG tablet TAKE ONE TABLET BY MOUTH TWICE DAILY 180 tablet 1  . Insulin Glargine (LANTUS SOLOSTAR) 100 UNIT/ML Solostar Pen Inject 10 Units into the skin daily at 10 pm. Titrate as directed by physician based on blood sugar response 3 pen PRN  . Insulin Pen Needle (PEN NEEDLES) 32G X 4 MM MISC 1 Units by Does not apply route daily. 30 each 1  . ipratropium (ATROVENT HFA) 17 MCG/ACT inhaler Inhale 2  puffs into the lungs 4 (four) times daily. 1 Inhaler 12  . Krill Oil CAPS Take 1 capsule by mouth daily.     . metFORMIN (GLUCOPHAGE) 1000 MG tablet Take 1 tablet (1,000 mg total) by mouth 2 (two) times daily with a meal. 180 tablet 0  . ranitidine (ZANTAC) 150 MG tablet TAKE ONE TABLET BY MOUTH TWICE DAILY. 180 tablet 0  . rivaroxaban (XARELTO) 20 MG TABS tablet Take 1 tablet (20 mg total) by mouth daily. 90 tablet 3  . sacubitril-valsartan (ENTRESTO) 49-51 MG Take 1 tablet by mouth 2 (two) times daily. 60 tablet 6  . varenicline (CHANTIX CONTINUING MONTH PAK) 1 MG tablet Take 1 tablet (1 mg total) by mouth 2 (two) times daily. 180 tablet 1   No current facility-administered medications on file prior to visit.    Allergies  Allergen Reactions  . Albuterol Anaphylaxis    Throat/lips swelling  . Morphine And Related Other (See Comments)    Very aggressive and doesn't help pain   Family History  Problem Relation Age of Onset  . Colon polyps Father   . Diabetes Father        mother  . Heart disease Father   . Heart murmur Mother   . Diabetes Mother   . Hypertension Mother   . Diabetes Brother   . Cancer Maternal Grandmother   . Diabetes Paternal Grandmother   . Heart disease Paternal Grandmother   . COPD Paternal Grandfather   . Heart disease Paternal Grandfather   . Hypertension Brother   . Hypertension Brother    Social History   Social History  . Marital status: Married    Spouse name: N/A  . Number of children: 0  . Years of education: N/A   Occupational History  . retired    Social History Main Topics  . Smoking status: Current Every Day Smoker    Packs/day: 1.00    Years: 40.00    Types: Cigarettes  . Smokeless tobacco: Never Used  . Alcohol use No  . Drug use: No  . Sexual activity: Not Asked   Other Topics Concern  . None   Social History Narrative   Exercise walking daily (varies)   Depression screen [facility] 2/9 09/29/2016 07/09/2016 06/23/2016 03/10/2016  02/04/2016  Decreased Interest 0 0 0 0 0  Down, Depressed, Hopeless 0 0 0 1 1  PHQ - 2 Score 0 0 0 1 1    Review of Systems See hpi    Objective:   Physical Exam  Constitutional: She is oriented to person, place, and time. She appears well-developed and well-nourished. No distress.  HENT:  Head: Normocephalic and atraumatic.  Right Ear: External ear normal.  Left Ear: External ear normal.  Eyes: Conjunctivae are normal. No scleral icterus.  Neck: Normal range of motion. Neck supple. No thyromegaly present.  Cardiovascular: Normal rate, regular rhythm, normal heart sounds and intact distal pulses.   Pulmonary/Chest: Effort normal and breath sounds normal. No respiratory distress.  Musculoskeletal: She exhibits no edema.  Lymphadenopathy:    She has no cervical adenopathy.  Neurological: She is alert and oriented to person, place, and time.  Skin: Skin is warm and dry. She is not diaphoretic. No erythema.  Psychiatric: She has a normal mood and affect. Her behavior is normal.         BP 110/73 (BP Location: Right Arm, Patient Position: Sitting, Cuff Size: Large)   Pulse 71   Temp 97.5 F (36.4 C) (Oral)   Resp 18   Ht 5' 5.91" (1.674 m)   Wt 204 lb 9.6 oz (92.8 kg)   SpO2 97%   BMI 33.12 kg/m   Assessment & Plan:   1. Type 2 diabetes mellitus without complication, without long-term current use of insulin ([place])   2. Essential hypertension   3. Acute combined systolic and diastolic congestive heart failure ([place])   4. Chronic obstructive pulmonary disease, unspecified COPD type ([place])   5. Chronic pain syndrome   6. Hyperlipidemia, unspecified hyperlipidemia type   7. Polypharmacy - has been on steady dose of chronic narcotics with hydrocodone 7.5 mg 3 times a day as well as a total of 400 mg of tramadol (= 8 tabs) daily for years. I started patient on diazepam 5 mg qhs and repeat 1 if needed in May 2016 for complaints of insomnia, caregiver stress during her mother's  worsening Alzheimer's, as well as myalgias. Her mother has now passed away so 2 years later it would be good to see if we could wean patient off of diazepam. She has been getting 3 month supply of the diazepam from her mail order pharmacy and using the maximum amount prescribed (=63m qhs) with fills always a few days early. At next visit we'll need to discuss new contraindications re: concomittant rxing of bzds and opioids and start weaning pt off of valium.  8. Tobacco abuse  - might be a Candidate for screening lung CT is still smoking slightly less than 1 pack per day. However have not referred yet as patient did have a CTA of her chest 12/29/15. Therefore will refer for low dose chest CT when it has been at least one year from prior. Would like to retry Chantix to help quit but currently cannot afford since she is in the donut hole.   9. Arthritis     Orders Placed This Encounter  Procedures  . Comprehensive metabolic panel  . Microalbumin/Creatinine Ratio, Urine  . POCT glycosylated hemoglobin (Hb A1C)  Renal function on labs today slightly flared likely due to mild dehydration. Encourage patient to increase water intake.  Meds ordered this encounter  Medications  . HYDROcodone-acetaminophen (NORCO) 7.5-325 MG tablet    Sig: Take 1 tablet by mouth every 8 (eight) hours as needed for moderate pain.    Dispense:  90 tablet    Refill:  0    May fill 60 days from date written  . HYDROcodone-acetaminophen (NORCO) 7.5-325 MG tablet    Sig: Take 1 tablet by mouth every 8 (eight) hours as needed for moderate pain.    Dispense:  90 tablet    Refill:  0  May fill 30d from date written  . HYDROcodone-acetaminophen (NORCO) 7.5-325 MG tablet    Sig: Take 1 tablet by mouth every 8 (eight) hours as needed for moderate pain.    Dispense:  90 tablet    Refill:  0  . traMADol (ULTRAM) 50 MG tablet    Sig: Take 2 tablets (100 mg total) by mouth every 6 (six) hours as needed for moderate pain.     Dispense:  240 tablet    Refill:  2   Over 25 min spent in face-to-face evaluation of and consultation with patient and coordination of care.  Over 50% of this time was spent counseling this patient.   Delman Cheadle, M.D.  Primary Care at [facility] : [facility] [place], [place] [address] [phone] phone [phone] fax  10/01/16 10:47 PM  Results for orders placed or performed in visit on 09/29/16  Comprehensive metabolic panel  Result Value Ref Range   Glucose 163 (H) 65 - 99 mg/dL   BUN 26 (H) 6 - 24 mg/dL   Creatinine, Ser 1.40 (H) 0.57 - 1.00 mg/dL   GFR calc non Af Amer 41 (L) >59 mL/min/1.73   GFR calc Af Amer 48 (L) >59 mL/min/1.73   BUN/Creatinine Ratio 19 9 - 23   Sodium 139 134 - 144 mmol/L   Potassium 4.6 3.5 - 5.2 mmol/L   Chloride 98 96 - 106 mmol/L   CO2 25 18 - 29 mmol/L   Calcium 9.3 8.7 - 10.2 mg/dL   Total Protein 6.9 6.0 - 8.5 g/dL   Albumin 3.9 3.5 - 5.5 g/dL   Globulin, Total 3.0 1.5 - 4.5 g/dL   Albumin/Globulin Ratio 1.3 1.2 - 2.2   Bilirubin Total 0.3 0.0 - 1.2 mg/dL   Alkaline Phosphatase 112 39 - 117 IU/L   AST 11 0 - 40 IU/L   ALT 6 0 - 32 IU/L  Microalbumin/Creatinine Ratio, Urine  Result Value Ref Range   Creatinine, Urine 218.5 Not Estab. mg/dL   Albumin, Urine 96.3 Not Estab. ug/mL   Microalb/Creat Ratio 44.1 (H) 0.0 - 30.0 mg/g creat  POCT glycosylated hemoglobin (Hb A1C)  Result Value Ref Range   Hemoglobin A1C 7.8

## 2016-09-29 NOTE — Patient Instructions (Addendum)
  Keep on your current medication regimen.    IF you received an x-ray today, you will receive an invoice from [facility]. Please contact [facility] at [phone] with questions or concerns regarding your invoice.   IF you received labwork today, you will receive an invoice from [facility]. Please contact [facility] at [phone] with questions or concerns regarding your invoice.   Our billing staff will not be able to assist you with questions regarding bills from these companies.  You will be contacted with the lab results as soon as they are available. The fastest way to get your results is to activate your My Chart account. Instructions are located on the last page of this paperwork. If you have not heard from [place] regarding the results in 2 weeks, please contact this office.

## 2016-09-30 ENCOUNTER — Other Ambulatory Visit: Payer: Self-pay | Admitting: Family Medicine

## 2016-09-30 LAB — COMPREHENSIVE METABOLIC PANEL
ALBUMIN: 3.9 g/dL (ref 3.5–5.5)
ALK PHOS: 112 IU/L (ref 39–117)
ALT: 6 IU/L (ref 0–32)
AST: 11 IU/L (ref 0–40)
Albumin/Globulin Ratio: 1.3 (ref 1.2–2.2)
BILIRUBIN TOTAL: 0.3 mg/dL (ref 0.0–1.2)
BUN / CREAT RATIO: 19 (ref 9–23)
BUN: 26 mg/dL — AB (ref 6–24)
CHLORIDE: 98 mmol/L (ref 96–106)
CO2: 25 mmol/L (ref 18–29)
Calcium: 9.3 mg/dL (ref 8.7–10.2)
Creatinine, Ser: 1.4 mg/dL — ABNORMAL HIGH (ref 0.57–1.00)
GFR calc Af Amer: 48 mL/min/{1.73_m2} — ABNORMAL LOW (ref >59)
GFR calc non Af Amer: 41 mL/min/{1.73_m2} — ABNORMAL LOW (ref >59)
GLUCOSE: 163 mg/dL — AB (ref 65–99)
Globulin, Total: 3 g/dL (ref 1.5–4.5)
POTASSIUM: 4.6 mmol/L (ref 3.5–5.2)
SODIUM: 139 mmol/L (ref 134–144)
Total Protein: 6.9 g/dL (ref 6.0–8.5)

## 2016-09-30 LAB — MICROALBUMIN / CREATININE URINE RATIO
CREATININE, UR: 218.5 mg/dL
Microalb/Creat Ratio: 44.1 mg/g creat — ABNORMAL HIGH (ref 0.0–30.0)
Microalbumin, Urine: 96.3 ug/mL

## 2016-10-01 NOTE — Telephone Encounter (Signed)
05/22/16 last refill 09/29/16 last visit

## 2016-10-06 ENCOUNTER — Other Ambulatory Visit: Payer: Self-pay | Admitting: Family Medicine

## 2016-10-06 NOTE — Telephone Encounter (Signed)
09/17/16 last refill

## 2016-10-07 ENCOUNTER — Other Ambulatory Visit (HOSPITAL_COMMUNITY): Payer: Self-pay | Admitting: Cardiology

## 2016-10-07 NOTE — Telephone Encounter (Signed)
Will print refill of a 3 mo supply in office 6/8 a.m.  Please call or fax in.

## 2016-10-10 MED ORDER — DIAZEPAM 5 MG PO TABS: 5.0000 mg | ORAL_TABLET | Freq: Every evening | ORAL | 0 refills | Status: DC | PRN

## 2016-10-21 ENCOUNTER — Other Ambulatory Visit: Payer: Self-pay | Admitting: Family Medicine

## 2016-11-08 ENCOUNTER — Other Ambulatory Visit: Payer: Self-pay | Admitting: Family Medicine

## 2016-11-30 ENCOUNTER — Other Ambulatory Visit (HOSPITAL_COMMUNITY): Payer: Self-pay | Admitting: Cardiology

## 2016-12-12 ENCOUNTER — Encounter: Payer: Self-pay | Admitting: Family Medicine

## 2016-12-13 MED ORDER — HALOBETASOL PROPIONATE 0.05 % EX CREA: TOPICAL_CREAM | Freq: Two times a day (BID) | CUTANEOUS | 0 refills | Status: DC

## 2016-12-14 ENCOUNTER — Ambulatory Visit (INDEPENDENT_AMBULATORY_CARE_PROVIDER_SITE_OTHER): Payer: PPO

## 2016-12-14 ENCOUNTER — Encounter: Payer: Self-pay | Admitting: Family Medicine

## 2016-12-14 ENCOUNTER — Ambulatory Visit (INDEPENDENT_AMBULATORY_CARE_PROVIDER_SITE_OTHER): Payer: PPO | Admitting: Family Medicine

## 2016-12-14 VITALS — BP 109/71 | HR 99 | Temp 98.2°F | Resp 18 | Ht 65.91 in | Wt 210.6 lb

## 2016-12-14 DIAGNOSIS — S90454A Superficial foreign body, right lesser toe(s), initial encounter: Secondary | ICD-10-CM | POA: Diagnosis not present

## 2016-12-14 DIAGNOSIS — S99921A Unspecified injury of right foot, initial encounter: Secondary | ICD-10-CM

## 2016-12-14 DIAGNOSIS — M7989 Other specified soft tissue disorders: Secondary | ICD-10-CM | POA: Diagnosis not present

## 2016-12-14 DIAGNOSIS — N183 Chronic kidney disease, stage 3 unspecified: Secondary | ICD-10-CM

## 2016-12-14 DIAGNOSIS — R21 Rash and other nonspecific skin eruption: Secondary | ICD-10-CM | POA: Diagnosis not present

## 2016-12-14 DIAGNOSIS — L089 Local infection of the skin and subcutaneous tissue, unspecified: Secondary | ICD-10-CM | POA: Diagnosis not present

## 2016-12-14 MED ORDER — DICLOFENAC SODIUM 1 % TD GEL: 2.0000 g | Freq: Four times a day (QID) | TRANSDERMAL | 1 refills | Status: DC

## 2016-12-14 MED ORDER — CEPHALEXIN 500 MG PO CAPS: 500.0000 mg | ORAL_CAPSULE | Freq: Four times a day (QID) | ORAL | 0 refills | Status: DC

## 2016-12-14 NOTE — Progress Notes (Addendum)
Subjective:  By signing my name below, I, Kaitlin Cantrell, attest that this documentation has been prepared under the direction and in the presence of Delman Cheadle, MD Electronically Signed: Ladene Artist, ED Scribe 12/14/2016 at 4:50 PM.   Patient ID: Kaitlin Cantrell, female    DOB: July 23, 1955, 61 y.o.   MRN: [id number]  Chief Complaint  Patient presents with  . Rash    right ankle, per pt states rash has been going on and off for 2 years. Pt states it is really itchy.  . Toe Pain    big toe on the right foot. Pt states toe hurts and can't bend it. She states she may have stepped on something and she had somw wood boards fall on the toe as well.   HPI  Kaitlin Cantrell is a 61 y.o. female who presents to Primary Care at [facility] complaining of a recurrent pruritic rash to the right ankle x 2 years, flared up again 1 week ago. Pt reports flare-ups at least 3 times a year. She reports drainage to the rash only after scratching the area. Rash resolves after using halobetasol but leaves a dark pigmentation. She has also tried soaking her foot in Epson salt, alcohol and peroxide and has also applied cortisone without significant relief.   R Great Toe Pain  Pt reports right great toe pain over the past week. Pt states that she stepped on an unknown object and 3 days later her brother dropped a piece of a wooden board on top of her toe. She has noticed redness, swelling and decreased range of motion to her right great toe. She has tried removing the object with sterile tweezers without success. Pt has also tried soaking her foot in Epson salt, alcohol and peroxide without significant relief.   Past Medical History:  Diagnosis Date  . Allergy    seasonal  . Arthritis   . Diabetes mellitus   . GERD (gastroesophageal reflux disease)   . History of pneumonia    15 years ago  . Hyperlipidemia   . Hypertension   . PE (pulmonary embolism) 12/2015   Current Outpatient Prescriptions on File Prior to  Visit  Medication Sig Dispense Refill  . atorvastatin (LIPITOR) 40 MG tablet Take 1 tablet (40 mg total) by mouth daily. 90 tablet 0  . blood glucose meter kit and supplies KIT Dispense based on patient and insurance preference. Use up to four times daily as directed. (FOR ICD-9 250.00, 250.01). 1 each 0  . budesonide-formoterol (SYMBICORT) 160-4.5 MCG/ACT inhaler Inhale 1 puff into the lungs 2 (two) times daily. 1 Inhaler 12  . cyclobenzaprine (FLEXERIL) 10 MG tablet TAKE ONE TABLET BY MOUTH THREE TIMES DAILY AS NEEDED FOR MUSCLE SPASMS. 270 tablet 0  . diazepam (VALIUM) 5 MG tablet Take 1-2 tablets (5-10 mg total) by mouth at bedtime as needed for anxiety, muscle spasms or sedation. 180 tablet 0  . ENTRESTO 49-51 MG TAKE ONE TABLET BY MOUTH TWICE DAILY 60 tablet 6  . furosemide (LASIX) 40 MG tablet Take 1 tablet (40 mg total) by mouth every other day. 45 tablet 3  . gabapentin (NEURONTIN) 300 MG capsule TAKE ONE CAPSULE BY MOUTH THREE TIMES DAILY 270 capsule 1  . glimepiride (AMARYL) 4 MG tablet TAKE ONE TABLET BY MOUTH TWICE DAILY 180 tablet 1  . halobetasol (ULTRAVATE) 0.05 % cream Apply topically 2 (two) times daily. 50 g 0  . HYDROcodone-acetaminophen (NORCO) 7.5-325 MG tablet Take 1 tablet by mouth  every 8 (eight) hours as needed for moderate pain. 90 tablet 0  . HYDROcodone-acetaminophen (NORCO) 7.5-325 MG tablet Take 1 tablet by mouth every 8 (eight) hours as needed for moderate pain. 90 tablet 0  . HYDROcodone-acetaminophen (NORCO) 7.5-325 MG tablet Take 1 tablet by mouth every 8 (eight) hours as needed for moderate pain. 90 tablet 0  . Insulin Glargine (LANTUS SOLOSTAR) 100 UNIT/ML Solostar Pen Inject 10 Units into the skin daily at 10 pm. Titrate as directed by physician based on blood sugar response 3 pen PRN  . Insulin Pen Needle (PEN NEEDLES) 32G X 4 MM MISC 1 Units by Does not apply route daily. 30 each 1  . ipratropium (ATROVENT HFA) 17 MCG/ACT inhaler Inhale 2 puffs into the lungs  4 (four) times daily. 1 Inhaler 12  . Krill Oil CAPS Take 1 capsule by mouth daily.     . metFORMIN (GLUCOPHAGE) 1000 MG tablet TAKE 1 TABLET BY MOUTH TWICE DAILY WITH A MEAL. 180 tablet 0  . ranitidine (ZANTAC) 150 MG tablet TAKE ONE TABLET BY MOUTH TWICE DAILY. 180 tablet 0  . rivaroxaban (XARELTO) 20 MG TABS tablet Take 1 tablet (20 mg total) by mouth daily. 90 tablet 3  . traMADol (ULTRAM) 50 MG tablet Take 2 tablets (100 mg total) by mouth every 6 (six) hours as needed for moderate pain. 240 tablet 2  . carvedilol (COREG) 12.5 MG tablet TAKE ONE TABLET BY MOUTH TWICE DAILY WITH MEAL (Patient not taking: Reported on 12/14/2016) 60 tablet 3  . varenicline (CHANTIX CONTINUING MONTH PAK) 1 MG tablet Take 1 tablet (1 mg total) by mouth 2 (two) times daily. (Patient not taking: Reported on 12/14/2016) 180 tablet 1   No current facility-administered medications on file prior to visit.    Allergies  Allergen Reactions  . Albuterol Anaphylaxis    Throat/lips swelling  . Morphine And Related Other (See Comments)    Very aggressive and doesn't help pain   Past Surgical History:  Procedure Laterality Date  . ANKLE FRACTURE SURGERY  1974   left and pinned then pins removed  . ANKLE GANGLION CYST EXCISION  1994   right  . BACK SURGERY  1998   discectomy and then fusion  . CARDIAC CATHETERIZATION N/A 04/05/2016   Procedure: Right/Left Heart Cath and Coronary Angiography;  Surgeon: Larey Dresser, MD;  Location: [facility];  Service: Cardiovascular;  Laterality: N/A;  . KNEE ARTHROPLASTY  02/14/2012   Procedure: COMPUTER ASSISTED TOTAL KNEE ARTHROPLASTY;  Surgeon: Meredith Pel, MD;  Location: [facility];  Service: Orthopedics;  Laterality: Left;  Left total knee arthroplasty  . TONSILLECTOMY  1972  . TUBAL LIGATION  1996  . UMBILICAL HERNIA REPAIR  1996   Family History  Problem Relation Age of Onset  . Colon polyps Father   . Diabetes Father        mother  . Heart disease Father     . Heart murmur Mother   . Diabetes Mother   . Hypertension Mother   . Diabetes Brother   . Cancer Maternal Grandmother   . Diabetes Paternal Grandmother   . Heart disease Paternal Grandmother   . COPD Paternal Grandfather   . Heart disease Paternal Grandfather   . Hypertension Brother   . Hypertension Brother    Social History   Social History  . Marital status: Married    Spouse name: N/A  . Number of children: 0  . Years of education: N/A  Occupational History  . retired    Social History Main Topics  . Smoking status: Current Every Day Smoker    Packs/day: 1.00    Years: 40.00    Types: Cigarettes  . Smokeless tobacco: Never Used  . Alcohol use No  . Drug use: No  . Sexual activity: Not Asked   Other Topics Concern  . None   Social History Narrative   Exercise walking daily (varies)   Depression screen [facility] 2/9 12/14/2016 09/29/2016 07/09/2016 06/23/2016 03/10/2016  Decreased Interest 0 0 0 0 0  Down, Depressed, Hopeless 0 0 0 0 1  PHQ - 2 Score 0 0 0 0 1    Review of Systems  Musculoskeletal: Positive for arthralgias and joint swelling.  Skin: Positive for color change and rash.      Objective:   Physical Exam  Constitutional: She is oriented to person, place, and time. She appears well-developed and well-nourished. No distress.  HENT:  Head: Normocephalic and atraumatic.  Eyes: Conjunctivae and EOM are normal.  Neck: Neck supple. No tracheal deviation present.  Cardiovascular: Normal rate.   Pulmonary/Chest: Effort normal. No respiratory distress.  Musculoskeletal: Normal range of motion.  Minimal ROM at the R first IP joint with R first digit with erythema and swelling.  Neurological: She is alert and oriented to person, place, and time.  Skin: Skin is warm and dry. Rash noted.  Quarter size erythematous plaque with honey-colored crusting an vesicles easily unroofed with pin-point bleeding and red/brown papules in linear streak spreading below.   Psychiatric: She has a normal mood and affect. Her behavior is normal.  Nursing note and vitals reviewed.      Dg Toe Great Right  Result Date: 12/14/2016 CLINICAL DATA:  erythematous and swollen with decreased movement at IP joint since crush inj 3 wks prior EXAM: RIGHT GREAT TOE COMPARISON:  None. FINDINGS: No fracture.  The IP joints normally spaced and aligned. The first metatarsophalangeal joint shows asymmetric narrowing as well as marginal osteophytes consistent with osteoarthritis. Soft tissue swelling is noted centered at the IP joint. IMPRESSION: 1. No fracture or dislocation.  No acute skeletal abnormality. 2. Osteoarthritis of the first metatarsophalangeal joint. 3. Mid right great toe soft tissue swelling. Electronically Signed   By: Lajean Manes M.D.   On: 12/14/2016 17:42   BP 109/71 (BP Location: Right Arm, Patient Position: Sitting, Cuff Size: Large)   Pulse 99   Temp 98.2 F (36.8 C) (Oral)   Resp 18   Ht 5' 5.91" (1.674 m)   Wt 210 lb 9.6 oz (95.5 kg)   SpO2 95%   BMI 34.08 kg/m     Assessment & Plan:   1. Rash and nonspecific skin eruption - atypical recurrent since injury sev years ago - bacterial wound clx and fungal clx P. If neg, consider biopsy. Ok to cont to trx with halobetasol prn.  2. Toe injury, right, initial encounter   3. Foreign body of toe of right foot, initial encounter - removed with 65L needle w/o complications - was in keratinized superficial epidermis w/ no open wound  4. Inflammation of toe - had crush injury but no bony injury on xray so inflammation seems out of proportion so concern for gout vs cellulitis. Will cover with antibiotic keflex qid x 1 wk and start topical voltaren gel - steroids and nsaids contrainc due to DMII and CKD.  5. Stage 3 chronic kidney disease     Orders Placed This Encounter  Procedures  .  WOUND CULTURE    Order Specific Question:   Source    Answer:   rash medial right ankle  . Fungus Stain  . DG Toe Great  Right    Standing Status:   Future    Number of Occurrences:   1    Standing Expiration Date:   12/14/2017    Order Specific Question:   Reason for Exam (SYMPTOM  OR DIAGNOSIS REQUIRED)    Answer:   erythematous and swollen with decreased movement at IP joint since crush inj 3 wks prior    Order Specific Question:   Is the patient pregnant?    Answer:   No    Order Specific Question:   Preferred imaging location?    Answer:   External  . Uric acid  . C-reactive protein  . Sedimentation rate  . CBC with Differential/Platelet  . Basic metabolic panel    Order Specific Question:   Has the patient fasted?    Answer:   No    Meds ordered this encounter  Medications  . cephALEXin (KEFLEX) 500 MG capsule    Sig: Take 1 capsule (500 mg total) by mouth 4 (four) times daily.    Dispense:  28 capsule    Refill:  0  . diclofenac sodium (VOLTAREN) 1 % GEL    Sig: Apply 2 g topically 4 (four) times daily. To right great toe    Dispense:  100 g    Refill:  1   Over 40 min spent in face-to-face evaluation of and consultation with patient and coordination of care.  Over 50% of this time was spent counseling this patient.  I personally performed the services described in this documentation, which was scribed in my presence. The recorded information has been reviewed and considered, and addended by me as needed.   Delman Cheadle, M.D.  Primary Care at [facility] [address] [place], [place] [address] [phone] phone [phone] fax  12/16/16 11:32 PM

## 2016-12-14 NOTE — Patient Instructions (Addendum)
   IF you received an x-ray today, you will receive an invoice from [facility]. Please contact [facility] at [phone] with questions or concerns regarding your invoice.   IF you received labwork today, you will receive an invoice from [facility]. Please contact [facility] at [phone] with questions or concerns regarding your invoice.   Our billing staff will not be able to assist you with questions regarding bills from these companies.  You will be contacted with the lab results as soon as they are available. The fastest way to get your results is to activate your My Chart account. Instructions are located on the last page of this paperwork. If you have not heard from us regarding the results in 2 weeks, please contact this office.      Crush Injury of the Foot When a crush injury of the foot occurs, many structures within the foot and ankle joint can be affected. This can result in a complicated injury that may involve:  One or more broken (fractured) bones.  Lacerations or abrasions of the skin. These increase your risk of infection.  Compressed or torn muscles.  Torn ligaments and tendons.  Broken blood vessels, causing bleeding within the tissues. This can lead to dangerously high pressure within the tissues (compartment syndrome).  Damage to nerves.  One or more toe amputations.  What are the causes? This type of injury can happen when a great amount of force is suddenly applied to the foot. This might occur:  During a motor vehicle accident.  If the foot is pulled into a machine during industrial or agricultural work.  If a heavy load falls directly onto the foot.  What are the signs or symptoms? Symptoms will vary depending on which structures in your foot have been injured. Symptoms may include:  Moderate or severe pain in the foot, ankle, or leg.  Bleeding at the site of injury.  Tingling, numbness, or loss of feeling (sensation) in  part or all of your foot.  Loss of movement in part or all of your foot.  How is this diagnosed? Your health care provider will examine you and ask questions about how your injury happened. The exam may include checking for sensation and blood flow into your foot. You may also have tests, including X-rays and procedures to check the pressure in your foot. After initial treatment, additional studies may be done to further diagnose the extent of your injuries. These may include:  A nerve conduction study to determine how well the nerves are working in your leg and foot.  MRI to determine if other injuries occurred that do not usually show up on X-ray.  How is this treated? Treatment for this condition depends on the severity of your crush injury. Treatment may include:  Thorough cleaning if you have an open wound. This may or may not require surgery.  Having a splint applied to your foot or leg.  Medicine to relieve pain.  Antibiotic medicine to prevent infection.  Stitches (sutures) to close open wounds.  One or more surgeries to address injuries to skin, bones, joints, tendons, ligaments, muscles, nerves, or blood vessels.  Follow these instructions at home: If you have a splint:  Wear the splint as told by your health care provider. Remove it only as told by your health care provider.  Loosen the splint if your toes tingle, become numb, or turn cold and blue.  Do not let your splint get wet if it is not waterproof.    Keep the splint clean. Wound care   If you have any skin wounds that were covered with bandages (dressings), follow instructions from your health care provider about how to take care of your wound. Make sure you: ? Wash your hands with soap and water before you change your dressing. If soap and water are not available, use hand sanitizer. ? Change your dressing as told by your health care provider. ? Leave stitches (sutures), skin glue, or adhesive strips in  place. These skin closures may need to stay in place for 2 weeks or longer. If adhesive strip edges start to loosen and curl up, you may trim the loose edges. Do not remove adhesive strips completely unless your health care provider tells you to do that.  If you have skin wounds, check them every day for signs of infection. Check for: ? More redness, swelling, or pain. ? More fluid or blood. ? Warmth. ? Pus or a bad smell. Managing pain, stiffness, and swelling  If directed, put ice on the injured area. ? Put ice in a plastic bag. ? Place a towel between your skin and the bag. ? Leave the ice on for 20 minutes, 2-3 times a day.  Raise (elevate) the injured area above the level of your heart while you are sitting or lying down. Driving  Do not drive or operate heavy machinery while taking prescription pain medicine.  Ask your health care provider when it is safe to drive if you have a splint on your foot or leg. Activity  Return to your normal activities as told by your health care provider. Ask your health care provider what activities are safe for you.  Work with a physical therapist (PT) or occupational therapist (OT) as told by your health care provider. General instructions  Do not put pressure on any part of the splint until it is fully hardened. This may take several hours.  If you have a splint and it is not waterproof, cover it with a watertight plastic bag when you take a bath or a shower.  Take over-the-counter and prescription medicines only as told by your health care provider.  If you were prescribed an antibiotic, take it as told by your health care provider. Do not stop taking the antibiotic before the prescription is done.  Do not use any tobacco products, such as cigarettes, chewing tobacco, and e-cigarettes. Tobacco can delay bone healing. If you need help quitting, ask your health care provider.  Keep all follow-up visits as told by your health care provider.  This is important. These include PT and OT visits. Contact a health care provider if:  A wound that was sutured opens up.  You have more redness, swelling, or pain in your foot.  You have more fluid or blood coming from your foot.  Your foot feels warm to the touch.  You have pus or a bad smell coming from your foot.  You have a fever. Get help right away if:  You suddenly develop severe pain in your foot.  You previously had sensation in your foot and you suddenly lose sensation.  Your symptoms had improved and they suddenly get worse.  Your foot or toes are turning pink or blue. This information is not intended to replace advice given to you by your health care provider. Make sure you discuss any questions you have with your health care provider. Document Released: 03/30/2015 Document Revised: 09/24/2015 Document Reviewed: 12/10/2014 [employer] Interactive Patient Education  2018 [employer]  

## 2016-12-15 DIAGNOSIS — M5136 Other intervertebral disc degeneration, lumbar region: Secondary | ICD-10-CM | POA: Diagnosis not present

## 2016-12-15 DIAGNOSIS — M9904 Segmental and somatic dysfunction of sacral region: Secondary | ICD-10-CM | POA: Diagnosis not present

## 2016-12-15 DIAGNOSIS — M9905 Segmental and somatic dysfunction of pelvic region: Secondary | ICD-10-CM | POA: Diagnosis not present

## 2016-12-15 DIAGNOSIS — M9903 Segmental and somatic dysfunction of lumbar region: Secondary | ICD-10-CM | POA: Diagnosis not present

## 2016-12-15 LAB — CBC WITH DIFFERENTIAL/PLATELET
BASOS ABS: 0 10*3/uL (ref 0.0–0.2)
Basos: 1 %
EOS (ABSOLUTE): 0.3 10*3/uL (ref 0.0–0.4)
Eos: 4 %
HEMOGLOBIN: 11.4 g/dL (ref 11.1–15.9)
Hematocrit: 36.2 % (ref 34.0–46.6)
IMMATURE GRANS (ABS): 0 10*3/uL (ref 0.0–0.1)
Immature Granulocytes: 0 %
LYMPHS: 27 %
Lymphocytes Absolute: 2.4 10*3/uL (ref 0.7–3.1)
MCH: 27 pg (ref 26.6–33.0)
MCHC: 31.5 g/dL (ref 31.5–35.7)
MCV: 86 fL (ref 79–97)
MONOCYTES: 8 %
Monocytes Absolute: 0.7 10*3/uL (ref 0.1–0.9)
NEUTROS PCT: 60 %
Neutrophils Absolute: 5.3 10*3/uL (ref 1.4–7.0)
Platelets: 379 10*3/uL (ref 150–379)
RBC: 4.23 x10E6/uL (ref 3.77–5.28)
RDW: 13.6 % (ref 12.3–15.4)
WBC: 8.7 10*3/uL (ref 3.4–10.8)

## 2016-12-15 LAB — BASIC METABOLIC PANEL
BUN/Creatinine Ratio: 16 (ref 9–23)
BUN: 19 mg/dL (ref 6–24)
CO2: 25 mmol/L (ref 20–29)
CREATININE: 1.19 mg/dL — AB (ref 0.57–1.00)
Calcium: 9.2 mg/dL (ref 8.7–10.2)
Chloride: 97 mmol/L (ref 96–106)
GFR calc Af Amer: 58 mL/min/{1.73_m2} — ABNORMAL LOW (ref >59)
GFR calc non Af Amer: 50 mL/min/{1.73_m2} — ABNORMAL LOW (ref >59)
GLUCOSE: 91 mg/dL (ref 65–99)
Potassium: 5.1 mmol/L (ref 3.5–5.2)
Sodium: 138 mmol/L (ref 134–144)

## 2016-12-15 LAB — SEDIMENTATION RATE: SED RATE: 119 mm/h — AB (ref 0–40)

## 2016-12-15 LAB — C-REACTIVE PROTEIN: CRP: 16.3 mg/L — ABNORMAL HIGH (ref 0.0–4.9)

## 2016-12-15 LAB — URIC ACID: Uric Acid: 7.3 mg/dL — ABNORMAL HIGH (ref 2.5–7.1)

## 2016-12-16 ENCOUNTER — Other Ambulatory Visit: Payer: Self-pay | Admitting: Family Medicine

## 2016-12-16 LAB — FUNGUS STAIN

## 2016-12-17 LAB — WOUND CULTURE

## 2016-12-28 ENCOUNTER — Ambulatory Visit (INDEPENDENT_AMBULATORY_CARE_PROVIDER_SITE_OTHER): Payer: PPO

## 2016-12-28 VITALS — BP 119/63 | HR 101 | Temp 98.4°F | Ht 66.0 in | Wt 214.1 lb

## 2016-12-28 DIAGNOSIS — Z23 Encounter for immunization: Secondary | ICD-10-CM | POA: Diagnosis not present

## 2016-12-28 DIAGNOSIS — Z0000 Encounter for general adult medical examination without abnormal findings: Secondary | ICD-10-CM | POA: Diagnosis not present

## 2016-12-28 NOTE — Progress Notes (Signed)
Subjective:   Kaitlin Cantrell is a 61 y.o. female who presents for an Initial Medicare Annual Wellness Visit.  Review of Systems    N/A  Cardiac Risk Factors include: diabetes mellitus;hypertension;dyslipidemia;smoking/ tobacco exposure;obesity (BMI >30kg/m2)     Objective:    Today's Vitals   12/28/16 1533 12/28/16 1534  BP: 119/63   Pulse: (!) 101   Temp: 98.4 F (36.9 C)   TempSrc: Oral   Weight: 214 lb 2 oz (97.1 kg)   Height: '5\' 6"'  (1.676 m)   PainSc:  9    Body mass index is 34.56 kg/m.   Current Medications (verified) Outpatient Encounter Prescriptions as of 12/28/2016  Medication Sig  . atorvastatin (LIPITOR) 40 MG tablet Take 1 tablet (40 mg total) by mouth daily.  . blood glucose meter kit and supplies KIT Dispense based on patient and insurance preference. Use up to four times daily as directed. (FOR ICD-9 250.00, 250.01).  . budesonide-formoterol (SYMBICORT) 160-4.5 MCG/ACT inhaler Inhale 1 puff into the lungs 2 (two) times daily.  . cephALEXin (KEFLEX) 500 MG capsule Take 500 mg by mouth 4 (four) times daily.  . cyclobenzaprine (FLEXERIL) 10 MG tablet TAKE ONE TABLET BY MOUTH THREE TIMES DAILY AS NEEDED FOR MUSCLE SPASMS.  . diazepam (VALIUM) 5 MG tablet Take 1-2 tablets (5-10 mg total) by mouth at bedtime as needed for anxiety, muscle spasms or sedation.  . diclofenac sodium (VOLTAREN) 1 % GEL Apply 2 g topically 4 (four) times daily. To right great toe  . ENTRESTO 49-51 MG TAKE ONE TABLET BY MOUTH TWICE DAILY  . furosemide (LASIX) 40 MG tablet Take 1 tablet (40 mg total) by mouth every other day.  . gabapentin (NEURONTIN) 300 MG capsule TAKE ONE CAPSULE BY MOUTH THREE TIMES DAILY  . glimepiride (AMARYL) 4 MG tablet TAKE 1 TABLET BY MOUTH TWICE DAILY  . halobetasol (ULTRAVATE) 0.05 % cream Apply topically 2 (two) times daily.  Marland Kitchen HYDROcodone-acetaminophen (NORCO) 7.5-325 MG tablet Take 1 tablet by mouth every 8 (eight) hours as needed for moderate pain.  Marland Kitchen  HYDROcodone-acetaminophen (NORCO) 7.5-325 MG tablet Take 1 tablet by mouth every 8 (eight) hours as needed for moderate pain.  Marland Kitchen HYDROcodone-acetaminophen (NORCO) 7.5-325 MG tablet Take 1 tablet by mouth every 8 (eight) hours as needed for moderate pain.  . Insulin Glargine (LANTUS SOLOSTAR) 100 UNIT/ML Solostar Pen Inject 10 Units into the skin daily at 10 pm. Titrate as directed by physician based on blood sugar response  . Insulin Pen Needle (PEN NEEDLES) 32G X 4 MM MISC 1 Units by Does not apply route daily.  Marland Kitchen ipratropium (ATROVENT HFA) 17 MCG/ACT inhaler Inhale 2 puffs into the lungs 4 (four) times daily.  Astrid Drafts CAPS Take 1 capsule by mouth daily.   . metFORMIN (GLUCOPHAGE) 1000 MG tablet TAKE 1 TABLET BY MOUTH TWICE DAILY WITH A MEAL  . ranitidine (ZANTAC) 150 MG tablet TAKE ONE TABLET BY MOUTH TWICE DAILY.  . rivaroxaban (XARELTO) 20 MG TABS tablet Take 1 tablet (20 mg total) by mouth daily.  . traMADol (ULTRAM) 50 MG tablet Take 2 tablets (100 mg total) by mouth every 6 (six) hours as needed for moderate pain.  . [DISCONTINUED] cephALEXin (KEFLEX) 500 MG capsule Take 1 capsule (500 mg total) by mouth 4 (four) times daily.  . carvedilol (COREG) 12.5 MG tablet TAKE ONE TABLET BY MOUTH TWICE DAILY WITH MEAL (Patient not taking: Reported on 12/14/2016)  . varenicline (CHANTIX CONTINUING MONTH PAK) 1 MG  tablet Take 1 tablet (1 mg total) by mouth 2 (two) times daily. (Patient not taking: Reported on 12/14/2016)   No facility-administered encounter medications on file as of 12/28/2016.     Allergies (verified) Albuterol and Morphine and related   History: Past Medical History:  Diagnosis Date  . Allergy    seasonal  . Arthritis   . Diabetes mellitus   . GERD (gastroesophageal reflux disease)   . History of pneumonia    15 years ago  . Hyperlipidemia   . Hypertension   . PE (pulmonary embolism) 12/2015   Past Surgical History:  Procedure Laterality Date  . ANKLE FRACTURE  SURGERY  1974   left and pinned then pins removed  . ANKLE GANGLION CYST EXCISION  1994   right  . BACK SURGERY  1998   discectomy and then fusion  . CARDIAC CATHETERIZATION N/A 04/05/2016   Procedure: Right/Left Heart Cath and Coronary Angiography;  Surgeon: Larey Dresser, MD;  Location: [facility];  Service: Cardiovascular;  Laterality: N/A;  . KNEE ARTHROPLASTY  02/14/2012   Procedure: COMPUTER ASSISTED TOTAL KNEE ARTHROPLASTY;  Surgeon: Meredith Pel, MD;  Location: [facility];  Service: Orthopedics;  Laterality: Left;  Left total knee arthroplasty  . TONSILLECTOMY  1972  . TUBAL LIGATION  1996  . UMBILICAL HERNIA REPAIR  1996   Family History  Problem Relation Age of Onset  . Colon polyps Father   . Diabetes Father        mother  . Heart disease Father   . Heart murmur Mother   . Diabetes Mother   . Hypertension Mother   . Diabetes Brother   . Cancer Maternal Grandmother   . Diabetes Paternal Grandmother   . Heart disease Paternal Grandmother   . COPD Paternal Grandfather   . Heart disease Paternal Grandfather   . Hypertension Brother   . Hypertension Brother    Social History   Occupational History  . retired    Social History Main Topics  . Smoking status: Current Every Day Smoker    Packs/day: 1.00    Years: 40.00    Types: Cigarettes  . Smokeless tobacco: Never Used  . Alcohol use Yes     Comment: rarely  . Drug use: No  . Sexual activity: Not on file    Tobacco Counseling Ready to quit: No Counseling given: Not Answered   Activities of Daily Living In your present state of health, do you have any difficulty performing the following activities: 12/28/2016 04/05/2016  Hearing? N N  Vision? N N  Difficulty concentrating or making decisions? N N  Walking or climbing stairs? Y N  Comment At times when her back hurts.  -  Dressing or bathing? N N  Doing errands, shopping? N -  Preparing Food and eating ? N -  Using the Toilet? N -  In the  past six months, have you accidently leaked urine? Y -  Comment Wears pads. Only has this issue when lifting heavy things and coughing.  -  Do you have problems with loss of bowel control? N -  Managing your Medications? N -  Managing your Finances? N -  Housekeeping or managing your Housekeeping? N -  Some recent data might be hidden    Immunizations and Health Maintenance Immunization History  Administered Date(s) Administered  . Influenza, Seasonal, Injecte, Preservative Fre 03/30/2012  . Influenza,inj,Quad PF,6+ Mos 03/08/2013, 04/18/2014, 01/08/2015, 02/04/2016, 12/28/2016  . Pneumococcal Polysaccharide-23 06/23/2016  .  Tdap 01/08/2015  . Zoster 05/15/2015   There are no preventive care reminders to display for this patient.  Patient Care Team: Shawnee Knapp, MD as PCP - General (Family Medicine) Clent Jacks, MD as Consulting Physician (Ophthalmology) Larey Dresser, MD as Consulting Physician (Cardiology)  Indicate any recent Medical Services you may have received from other than [facility] providers in the past year (date may be approximate).     Assessment:   This is a routine wellness examination for Kaitlin Cantrell.   Hearing/Vision screen Vision Screening Comments: Patient states she sees Dr. Katy Fitch for her yearly eye exams.   Dietary issues and exercise activities discussed: Current Exercise Habits: The patient does not participate in regular exercise at present, Exercise limited by: None identified  Goals    . Quit smoking / using tobacco          Patient will try to quit smoking in the near future. Patient will try the medication Chanitx one more time.       Depression Screen PHQ 2/9 Scores 12/28/2016 12/14/2016 09/29/2016 07/09/2016 06/23/2016 03/10/2016 02/04/2016  PHQ - 2 Score 1 0 0 0 0 1 1    Fall Risk Fall Risk  12/28/2016 12/14/2016 09/29/2016 07/09/2016 06/23/2016  Falls in the past year? No Yes No No No  Number falls in past yr: - 2 or more - - -  Injury with Fall? -  No - - -  Follow up - Falls evaluation completed - - -    Cognitive Function:     6CIT Screen 12/28/2016  What Year? 0 points  What month? 0 points  What time? 0 points  Count back from 20 0 points  Months in reverse 0 points  Repeat phrase 0 points  Total Score 0    Screening Tests Health Maintenance  Topic Date Due  . PAP SMEAR  03/09/2017 (Originally 03/10/2014)  . MAMMOGRAM  03/02/2017  . OPHTHALMOLOGY EXAM  03/30/2017  . HEMOGLOBIN A1C  03/31/2017  . FOOT EXAM  06/23/2017  . URINE MICROALBUMIN  09/29/2017  . COLONOSCOPY  05/02/2021  . TETANUS/TDAP  01/07/2025  . PNEUMOCOCCAL POLYSACCHARIDE VACCINE  Completed  . INFLUENZA VACCINE  Completed  . Hepatitis C Screening  Completed  . HIV Screening  Completed      Plan:  I have personally reviewed and addressed the Medicare Annual Wellness questionnaire and have noted the following in the patient's chart:  A. Medical and social history B. Use of alcohol, tobacco or illicit drugs  C. Current medications and supplements D. Functional ability and status E.  Nutritional status F.  Physical activity G. Advance directives H. List of other physicians I.  Hospitalizations, surgeries, and ER visits in previous 12 months J.  [place] such as hearing and vision if needed, cognitive and depression L. Referrals and appointments - none  In addition, I have reviewed and discussed with patient certain preventive protocols, quality metrics, and best practice recommendations. A written personalized care plan for preventive services as well as general preventive health recommendations were provided to patient.    Signed,  Andrez Grime, LPN Nurse Health Advisor   MD Recommendations: none

## 2016-12-28 NOTE — Patient Instructions (Addendum)
[name] , Thank you for taking time to come for your Medicare Wellness Visit. I appreciate your ongoing commitment to your health goals. Please review the following plan we discussed and let me know if I can assist you in the future.   Screening recommendations/referrals: Colonoscopy: completed 05/03/2011 Mammogram: completed 03/04/2015, due and patient will call and set this up.  Bone Density: start at age 60 or older Recommended yearly ophthalmology/optometry visit for glaucoma screening and checkup Recommended yearly dental visit for hygiene and checkup  Vaccinations: Influenza vaccine: Patient will get today.  Pneumococcal vaccine: Prevnar 13 after age 65 Tdap vaccine: completed 01/08/2015 Shingles vaccine: Patient received this vaccine on 05/2015 at [facility].   Advanced directives: Advance directive discussed with you today. Even though you declined this today please call our office should you change your mind and we can give you the proper paperwork for you to fill out.  Conditions/risks identified: Patient will try to quit smoking in the near future. Patient will try the medication Chanitx one more time.   Next appointment: 01/05/17 @ 10:40 am with Dr. Brigitte Pulse  Preventive Care 40-64 Years, Female Preventive care refers to lifestyle choices and visits with your health care provider that can promote health and wellness. What does preventive care include?  A yearly physical exam. This is also called an annual well check.  Dental exams once or twice a year.  Routine eye exams. Ask your health care provider how often you should have your eyes checked.  Personal lifestyle choices, including:  Daily care of your teeth and gums.  Regular physical activity.  Eating a healthy diet.  Avoiding tobacco and drug use.  Limiting alcohol use.  Practicing safe sex.  Taking low-dose aspirin daily starting at age 78.  Taking vitamin and mineral supplements as recommended by your health  care provider. What happens during an annual well check? The services and screenings done by your health care provider during your annual well check will depend on your age, overall health, lifestyle risk factors, and family history of disease. Counseling  Your health care provider may ask you questions about your:  Alcohol use.  Tobacco use.  Drug use.  Emotional well-being.  Home and relationship well-being.  Sexual activity.  Eating habits.  Work and work Statistician.  Method of birth control.  Menstrual cycle.  Pregnancy history. Screening  You may have the following tests or measurements:  Height, weight, and BMI.  Blood pressure.  Lipid and cholesterol levels. These may be checked every 5 years, or more frequently if you are over 83 years old.  Skin check.  Lung cancer screening. You may have this screening every year starting at age 54 if you have a 30-pack-year history of smoking and currently smoke or have quit within the past 15 years.  Fecal occult blood test (FOBT) of the stool. You may have this test every year starting at age 60.  Flexible sigmoidoscopy or colonoscopy. You may have a sigmoidoscopy every 5 years or a colonoscopy every 10 years starting at age 50.  Hepatitis C blood test.  Hepatitis B blood test.  Sexually transmitted disease (STD) testing.  Diabetes screening. This is done by checking your blood sugar (glucose) after you have not eaten for a while (fasting). You may have this done every 1-3 years.  Mammogram. This may be done every 1-2 years. Talk to your health care provider about when you should start having regular mammograms. This may depend on whether you have a  family history of breast cancer.  BRCA-related cancer screening. This may be done if you have a family history of breast, ovarian, tubal, or peritoneal cancers.  Pelvic exam and Pap test. This may be done every 3 years starting at age 16. Starting at age 31, this may  be done every 5 years if you have a Pap test in combination with an HPV test.  Bone density scan. This is done to screen for osteoporosis. You may have this scan if you are at high risk for osteoporosis. Discuss your test results, treatment options, and if necessary, the need for more tests with your health care provider. Vaccines  Your health care provider may recommend certain vaccines, such as:  Influenza vaccine. This is recommended every year.  Tetanus, diphtheria, and acellular pertussis (Tdap, Td) vaccine. You may need a Td booster every 10 years.  Zoster vaccine. You may need this after age 30.  Pneumococcal 13-valent conjugate (PCV13) vaccine. You may need this if you have certain conditions and were not previously vaccinated.  Pneumococcal polysaccharide (PPSV23) vaccine. You may need one or two doses if you smoke cigarettes or if you have certain conditions. Talk to your health care provider about which screenings and vaccines you need and how often you need them. This information is not intended to replace advice given to you by your health care provider. Make sure you discuss any questions you have with your health care provider. Document Released: 05/15/2015 Document Revised: 01/06/2016 Document Reviewed: 02/17/2015 Elsevier Interactive Patient Education  2017 [employer] Prevention in the Home Falls can cause injuries. They can happen to people of all ages. There are many things you can do to make your home safe and to help prevent falls. What can I do on the outside of my home?  Regularly fix the edges of walkways and driveways and fix any cracks.  Remove anything that might make you trip as you walk through a door, such as a raised step or threshold.  Trim any bushes or trees on the path to your home.  Use bright outdoor lighting.  Clear any walking paths of anything that might make someone trip, such as rocks or tools.  Regularly check to see if  handrails are loose or broken. Make sure that both sides of any steps have handrails.  Any raised decks and porches should have guardrails on the edges.  Have any leaves, snow, or ice cleared regularly.  Use sand or salt on walking paths during winter.  Clean up any spills in your garage right away. This includes oil or grease spills. What can I do in the bathroom?  Use night lights.  Install grab bars by the toilet and in the tub and shower. Do not use towel bars as grab bars.  Use non-skid mats or decals in the tub or shower.  If you need to sit down in the shower, use a plastic, non-slip stool.  Keep the floor dry. Clean up any water that spills on the floor as soon as it happens.  Remove soap buildup in the tub or shower regularly.  Attach bath mats securely with double-sided non-slip rug tape.  Do not have throw rugs and other things on the floor that can make you trip. What can I do in the bedroom?  Use night lights.  Make sure that you have a light by your bed that is easy to reach.  Do not use any sheets or blankets that are  too big for your bed. They should not hang down onto the floor.  Have a firm chair that has side arms. You can use this for support while you get dressed.  Do not have throw rugs and other things on the floor that can make you trip. What can I do in the kitchen?  Clean up any spills right away.  Avoid walking on wet floors.  Keep items that you use a lot in easy-to-reach places.  If you need to reach something above you, use a strong step stool that has a grab bar.  Keep electrical cords out of the way.  Do not use floor polish or wax that makes floors slippery. If you must use wax, use non-skid floor wax.  Do not have throw rugs and other things on the floor that can make you trip. What can I do with my stairs?  Do not leave any items on the stairs.  Make sure that there are handrails on both sides of the stairs and use them. Fix  handrails that are broken or loose. Make sure that handrails are as long as the stairways.  Check any carpeting to make sure that it is firmly attached to the stairs. Fix any carpet that is loose or worn.  Avoid having throw rugs at the top or bottom of the stairs. If you do have throw rugs, attach them to the floor with carpet tape.  Make sure that you have a light switch at the top of the stairs and the bottom of the stairs. If you do not have them, ask someone to add them for you. What else can I do to help prevent falls?  Wear shoes that:  Do not have high heels.  Have rubber bottoms.  Are comfortable and fit you well.  Are closed at the toe. Do not wear sandals.  If you use a stepladder:  Make sure that it is fully opened. Do not climb a closed stepladder.  Make sure that both sides of the stepladder are locked into place.  Ask someone to hold it for you, if possible.  Clearly mark and make sure that you can see:  Any grab bars or handrails.  First and last steps.  Where the edge of each step is.  Use tools that help you move around (mobility aids) if they are needed. These include:  Canes.  Walkers.  Scooters.  Crutches.  Turn on the lights when you go into a dark area. Replace any light bulbs as soon as they burn out.  Set up your furniture so you have a clear path. Avoid moving your furniture around.  If any of your floors are uneven, fix them.  If there are any pets around you, be aware of where they are.  Review your medicines with your doctor. Some medicines can make you feel dizzy. This can increase your chance of falling. Ask your doctor what other things that you can do to help prevent falls. This information is not intended to replace advice given to you by your health care provider. Make sure you discuss any questions you have with your health care provider. Document Released: 02/12/2009 Document Revised: 09/24/2015 Document Reviewed:  05/23/2014 Elsevier Interactive Patient Education  2017 [employer].

## 2017-01-05 ENCOUNTER — Encounter: Payer: Self-pay | Admitting: Family Medicine

## 2017-01-05 ENCOUNTER — Ambulatory Visit (INDEPENDENT_AMBULATORY_CARE_PROVIDER_SITE_OTHER): Payer: PPO | Admitting: Family Medicine

## 2017-01-05 ENCOUNTER — Other Ambulatory Visit: Payer: Self-pay | Admitting: Family Medicine

## 2017-01-05 VITALS — BP 105/71 | HR 94 | Temp 98.0°F | Resp 16 | Ht 66.0 in | Wt 214.0 lb

## 2017-01-05 DIAGNOSIS — E119 Type 2 diabetes mellitus without complications: Secondary | ICD-10-CM | POA: Diagnosis not present

## 2017-01-05 DIAGNOSIS — M1711 Unilateral primary osteoarthritis, right knee: Secondary | ICD-10-CM

## 2017-01-05 DIAGNOSIS — M109 Gout, unspecified: Secondary | ICD-10-CM | POA: Diagnosis not present

## 2017-01-05 DIAGNOSIS — G894 Chronic pain syndrome: Secondary | ICD-10-CM | POA: Diagnosis not present

## 2017-01-05 DIAGNOSIS — Z79899 Other long term (current) drug therapy: Secondary | ICD-10-CM | POA: Diagnosis not present

## 2017-01-05 DIAGNOSIS — E785 Hyperlipidemia, unspecified: Secondary | ICD-10-CM | POA: Diagnosis not present

## 2017-01-05 DIAGNOSIS — I10 Essential (primary) hypertension: Secondary | ICD-10-CM

## 2017-01-05 LAB — POCT GLYCOSYLATED HEMOGLOBIN (HGB A1C): Hemoglobin A1C: 6.8

## 2017-01-05 MED ORDER — PEN NEEDLES 32G X 4 MM MISC: 1.0000 [IU] | Freq: Every day | 1 refills | Status: DC

## 2017-01-05 MED ORDER — IPRATROPIUM BROMIDE HFA 17 MCG/ACT IN AERS: 2.0000 | INHALATION_SPRAY | Freq: Four times a day (QID) | RESPIRATORY_TRACT | 12 refills | Status: DC

## 2017-01-05 MED ORDER — CARVEDILOL 12.5 MG PO TABS: ORAL_TABLET | ORAL | 3 refills | Status: DC

## 2017-01-05 MED ORDER — HYDROCODONE-ACETAMINOPHEN 7.5-325 MG PO TABS: 1.0000 | ORAL_TABLET | Freq: Three times a day (TID) | ORAL | 0 refills | Status: DC | PRN

## 2017-01-05 MED ORDER — INSULIN GLARGINE 100 UNIT/ML SOLOSTAR PEN: 10.0000 [IU] | PEN_INJECTOR | Freq: Every day | SUBCUTANEOUS | 99 refills | Status: DC

## 2017-01-05 MED ORDER — GLIMEPIRIDE 4 MG PO TABS: 4.0000 mg | ORAL_TABLET | Freq: Two times a day (BID) | ORAL | 1 refills | Status: DC

## 2017-01-05 MED ORDER — ATORVASTATIN CALCIUM 40 MG PO TABS: 40.0000 mg | ORAL_TABLET | Freq: Every day | ORAL | 1 refills | Status: DC

## 2017-01-05 MED ORDER — RANITIDINE HCL 150 MG PO TABS: 150.0000 mg | ORAL_TABLET | Freq: Two times a day (BID) | ORAL | 3 refills | Status: DC

## 2017-01-05 MED ORDER — SULFAMETHOXAZOLE-TRIMETHOPRIM 800-160 MG PO TABS: 1.0000 | ORAL_TABLET | Freq: Two times a day (BID) | ORAL | 0 refills | Status: DC

## 2017-01-05 MED ORDER — METFORMIN HCL 1000 MG PO TABS: ORAL_TABLET | ORAL | 1 refills | Status: DC

## 2017-01-05 MED ORDER — TRAMADOL HCL 50 MG PO TABS: 100.0000 mg | ORAL_TABLET | Freq: Four times a day (QID) | ORAL | 2 refills | Status: DC | PRN

## 2017-01-05 MED ORDER — BUDESONIDE-FORMOTEROL FUMARATE 160-4.5 MCG/ACT IN AERO: 1.0000 | INHALATION_SPRAY | Freq: Two times a day (BID) | RESPIRATORY_TRACT | 12 refills | Status: DC

## 2017-01-05 MED ORDER — GABAPENTIN 300 MG PO CAPS: 300.0000 mg | ORAL_CAPSULE | Freq: Three times a day (TID) | ORAL | 1 refills | Status: DC

## 2017-01-05 MED ORDER — DIAZEPAM 5 MG PO TABS: 5.0000 mg | ORAL_TABLET | Freq: Every evening | ORAL | 0 refills | Status: DC | PRN

## 2017-01-05 NOTE — Patient Instructions (Addendum)
I don't have any information about the individual insurance companies - I would recommend checking with her pharmacist or a medicare healthcare consulting firm that aren't sponsored by one of the companies and provide services at no cost to the patient such as [employer] with who she could schedule a free consultation appt in [place] by calling [phone].   There are some easy diet adjustments that you can make to lower your blood uric acid level and thereby hopefully never have to suffer from another gout flair (or at least less frequently).  You should avoid alcohol, drink plenty of water, and try to follow a "low purine" diet as your body produces uric acid when it breaks down prurines-substances that are found naturally in your body, as well as in certain foods such as organ meats, anchioves, herring, asparagus, and mushrooms. Also, increasing your diet in certain foods that may lower uric acid levels is a pretty safe way to decrease your likelihood of gout so consider drinking coffee (regular and/or decaf), eating fruits with Vitamin C in them such as citrus fruits, strawberries, broccoli,  brussel sprouts, papaya, and cantaloupe (though megadoses of vitamin C supplements may do the opposite and increase your body's uric acid levels), and/or eating more cherries and other dark-colored fruits, such as blackberries, blueberries, purple grapes and raspberries. In addition, getting plenty of vitamin A though yellow fruits, or dark green/yellow vegetables at least every other day is good.  Other general diet guidelines for people with gout who need to lower their blood uric acid levels are as follows:  Drink 8 to 16 cups ( about 2 to 4 liters) of fluid each day, with at least half being water. Eat a moderate amount of protein, preferably from healthy sources, such as low-fat or fat-free dairy, tofu, eggs, and nut butters. Limit your daily intake of meat, fish, and poultry to 4 to 6  ounces. Avoid high fat meats and desserts. Decrease your intake of shellfish, beef, lamb, pork, eggs and cheese. Avoid drastic weight reduction or fasting.  If weight loss is desired lose it over a period of several months.   IF you received an x-ray today, you will receive an invoice from [facility]. Please contact [facility] at [phone] with questions or concerns regarding your invoice.   IF you received labwork today, you will receive an invoice from [facility]. Please contact [facility] at [phone] with questions or concerns regarding your invoice.   Our billing staff will not be able to assist you with questions regarding bills from these companies.  You will be contacted with the lab results as soon as they are available. The fastest way to get your results is to activate your My Chart account. Instructions are located on the last page of this paperwork. If you have not heard from [place] regarding the results in 2 weeks, please contact this office.     Low-Purine Diet Purines are compounds that affect the level of uric acid in your body. A low-purine diet is a diet that is low in purines. Eating a low-purine diet can prevent the level of uric acid in your body from getting too high and causing gout or kidney stones or both. What do I need to know about this diet?  Choose low-purine foods. Examples of low-purine foods are listed in the next section.  Drink plenty of fluids, especially water. Fluids can help remove uric acid from your body. Try to drink 8-16 cups (1.9-3.8 L) a day.  Limit foods high in fat, especially saturated fat, as fat makes it harder for the body to get rid of uric acid. Foods high in saturated fat include pizza, cheese, ice cream, whole milk, fried foods, and gravies. Choose foods that are lower in fat and lean sources of protein. Use olive oil when cooking as it contains healthy fats that are not high in saturated fat.  Limit alcohol. Alcohol  interferes with the elimination of uric acid from your body. If you are having a gout attack, avoid all alcohol.  Keep in mind that different people's bodies react differently to different foods. You will probably learn over time which foods do or do not affect you. If you discover that a food tends to cause your gout to flare up, avoid eating that food. You can more freely enjoy foods that do not cause problems. If you have any questions about a food item, talk to your dietitian or health care provider. Which foods are low, moderate, and high in purines? The following is a list of foods that are low, moderate, and high in purines. You can eat any amount of the foods that are low in purines. You may be able to have small amounts of foods that are moderate in purines. Ask your health care provider how much of a food moderate in purines you can have. Avoid foods high in purines. Grains  Foods low in purines: Enriched white bread, pasta, rice, cake, cornbread, popcorn.  Foods moderate in purines: Whole-grain breads and cereals, wheat germ, bran, oatmeal. Uncooked oatmeal. Dry wheat bran or wheat germ.  Foods high in purines: Pancakes, [place] toast, biscuits, muffins. Vegetables  Foods low in purines: All vegetables, except those that are moderate in purines.  Foods moderate in purines: Asparagus, cauliflower, spinach, mushrooms, green peas. Fruits  All fruits are low in purines. Meats and other Protein Foods  Foods low in purines: Eggs, nuts, peanut butter.  Foods moderate in purines: 80-90% lean beef, lamb, veal, pork, poultry, fish, eggs, peanut butter, nuts. Crab, lobster, oysters, and shrimp. Cooked dried beans, peas, and lentils.  Foods high in purines: Anchovies, sardines, herring, mussels, tuna, codfish, scallops, trout, and haddock. Kaitlin Cantrell. Organ meats (such as liver or kidney). Tripe. Game meat. Goose. Sweetbreads. Dairy  All dairy foods are low in purines. Low-fat and fat-free  dairy products are best because they are low in saturated fat. Beverages  Drinks low in purines: Water, carbonated beverages, tea, coffee, cocoa.  Drinks moderate in purines: Soft drinks and other drinks sweetened with high-fructose corn syrup. Juices. To find whether a food or drink is sweetened with high-fructose corn syrup, look at the ingredients list.  Drinks high in purines: Alcoholic beverages (such as beer). Condiments  Foods low in purines: Salt, herbs, olives, pickles, relishes, vinegar.  Foods moderate in purines: Butter, margarine, oils, mayonnaise. Fats and Oils  Foods low in purines: All types, except gravies and sauces made with meat.  Foods high in purines: Gravies and sauces made with meat. Other Foods  Foods low in purines: Sugars, sweets, gelatin. Cake. Soups made without meat.  Foods moderate in purines: Meat-based or fish-based soups, broths, or bouillons. Foods and drinks sweetened with high-fructose corn syrup.  Foods high in purines: High-fat desserts (such as ice cream, cookies, cakes, pies, doughnuts, and chocolate). Contact your dietitian for more information on foods that are not listed here. This information is not intended to replace advice given to you by your health care provider. Make sure you  discuss any questions you have with your health care provider. Document Released: 08/13/2010 Document Revised: 09/24/2015 Document Reviewed: 03/25/2013 Elsevier Interactive Patient Education  2017 [employer].

## 2017-01-05 NOTE — Progress Notes (Signed)
Subjective:    Patient ID: Kaitlin Cantrell, female    DOB: [birth year], 61 y.o.   MRN: [id number] Chief Complaint  Patient presents with  . Diabetes    follow-up    HPI DM: Optho exam 03/30/16 with Dr. Clent Jacks was normal/neg. Foot exam done 06/23/16. Urine microalb was done 09/29/16 but [facility] lost the specimen.  On metformin and amaryl and tolerating well. Using lantus prn 10u about 4-5x/wk. She did go over 200 one day. Most have been around 100-130. No lows.  90 is the lowest cbg she has seen.   Lab Results  Component Value Date   HGBA1C 7.8 09/29/2016   HGBA1C 12.1 06/23/2016   HGBA1C 7.5 (H) 02/04/2016   HGBA1C 6.9 10/01/2015   HGBA1C 7.1 (H) 05/14/2015    Pt developed CHF after large PE which caused acute cor pulmonale causing significant dyspnea and decline in physical activity. She was wanting to do cardiac rehab but couldn't afford the copays since there were so many office visits and medications as well.  HLD goalLDL<70. Was 90 4 mos ago.  Added a little sodium back to her diet as her bp was low and no edema and not noticed any recurrence of the edema.   Tobacco abuse: did try the chantix but wasn't the right time so she still has some but can't affo5rd more right now as in donut hole f0or another $2000. Slightly less ythan 1 ppd.  Ankle itching and then the bumps start, then she starts the halobetasol but didn't blister yet but would if she used the halobetasol  Little gerd as she came off of zantac for several days. Went off prilosec so has been on otc nexium for the next week.   Past Medical History:  Diagnosis Date  . Allergy    seasonal  . Arthritis   . Diabetes mellitus   . GERD (gastroesophageal reflux disease)   . History of pneumonia    15 years ago  . Hyperlipidemia   . Hypertension   . PE (pulmonary embolism) 12/2015   Past Surgical History:  Procedure Laterality Date  . ANKLE FRACTURE SURGERY  1974   left and pinned then pins removed  .  ANKLE GANGLION CYST EXCISION  1994   right  . BACK SURGERY  1998   discectomy and then fusion  . CARDIAC CATHETERIZATION N/A 04/05/2016   Procedure: Right/Left Heart Cath and Coronary Angiography;  Surgeon: Larey Dresser, MD;  Location: [facility];  Service: Cardiovascular;  Laterality: N/A;  . KNEE ARTHROPLASTY  02/14/2012   Procedure: COMPUTER ASSISTED TOTAL KNEE ARTHROPLASTY;  Surgeon: Meredith Pel, MD;  Location: [place];  Service: Orthopedics;  Laterality: Left;  Left total knee arthroplasty  . TONSILLECTOMY  1972  . TUBAL LIGATION  1996  . UMBILICAL HERNIA REPAIR  1996   Current Outpatient Prescriptions on File Prior to Visit  Medication Sig Dispense Refill  . blood glucose meter kit and supplies KIT Dispense based on patient and insurance preference. Use up to four times daily as directed. (FOR ICD-9 250.00, 250.01). 1 each 0  . diclofenac sodium (VOLTAREN) 1 % GEL Apply 2 g topically 4 (four) times daily. To right great toe 100 g 1  . ENTRESTO 49-51 MG TAKE ONE TABLET BY MOUTH TWICE DAILY 60 tablet 6  . furosemide (LASIX) 40 MG tablet Take 1 tablet (40 mg total) by mouth every other day. 45 tablet 3  . halobetasol (ULTRAVATE) 0.05 % cream  Apply topically 2 (two) times daily. 50 g 0  . Krill Oil CAPS Take 1 capsule by mouth daily.     . rivaroxaban (XARELTO) 20 MG TABS tablet Take 1 tablet (20 mg total) by mouth daily. 90 tablet 3   No current facility-administered medications on file prior to visit.    Allergies  Allergen Reactions  . Albuterol Anaphylaxis    Throat/lips swelling  . Morphine And Related Other (See Comments)    Very aggressive and doesn't help pain   Family History  Problem Relation Age of Onset  . Colon polyps Father   . Diabetes Father        mother  . Heart disease Father   . Heart murmur Mother   . Diabetes Mother   . Hypertension Mother   . Diabetes Brother   . Cancer Maternal Grandmother   . Diabetes Paternal Grandmother   . Heart  disease Paternal Grandmother   . COPD Paternal Grandfather   . Heart disease Paternal Grandfather   . Hypertension Brother   . Hypertension Brother    Social History   Social History  . Marital status: Married    Spouse name: N/A  . Number of children: 0  . Years of education: N/A   Occupational History  . retired    Social History Main Topics  . Smoking status: Current Every Day Smoker    Packs/day: 1.00    Years: 40.00    Types: Cigarettes  . Smokeless tobacco: Never Used  . Alcohol use Yes     Comment: rarely  . Drug use: No  . Sexual activity: Not Asked   Other Topics Concern  . None   Social History Narrative   Exercise walking daily (varies)   Depression screen [facility] 2/9 01/05/2017 12/28/2016 12/14/2016 09/29/2016 07/09/2016  Decreased Interest 0 0 0 0 0  Down, Depressed, Hopeless 0 1 0 0 0  PHQ - 2 Score 0 1 0 0 0    Review of Systems See hpi    Objective:   Physical Exam  Constitutional: She is oriented to person, place, and time. She appears well-developed and well-nourished. No distress.  HENT:  Head: Normocephalic and atraumatic.  Right Ear: External ear normal.  Left Ear: External ear normal.  Eyes: Conjunctivae are normal. No scleral icterus.  Neck: Normal range of motion. Neck supple. No thyromegaly present.  Cardiovascular: Normal rate, regular rhythm, normal heart sounds and intact distal pulses.   Pulmonary/Chest: Effort normal and breath sounds normal. No respiratory distress.  Musculoskeletal: She exhibits no edema.  Lymphadenopathy:    She has no cervical adenopathy.  Neurological: She is alert and oriented to person, place, and time.  Skin: Skin is warm and dry. She is not diaphoretic. No erythema.  Psychiatric: She has a normal mood and affect. Her behavior is normal.      BP 105/71   Pulse 94   Temp 98 F (36.7 C) (Oral)   Resp 16   Ht _0  (1.676 m)   Wt 214 lb (97.1 kg)   SpO2 95%   BMI 34.54 kg/m      Assessment & Plan:    1. Type 2 diabetes mellitus without complication, without long-term current use of insulin ([place])   2. Essential hypertension   3. Hyperlipidemia, unspecified hyperlipidemia type   4. Polypharmacy   5. Chronic pain syndrome   6. Podagra   7. Primary osteoarthritis of right knee - giving out but wants to defer  orthopedic eval until the new year, failed cortisone inj prior. Try brace.    Clx from rash was + and resistent to keflex - sxs improve but persisting on lower ext rash - just restarted to will treat with bactrim. Sotp topical halobetasol - no steroids. Potential for gout in toe as well with elev uric aicd and mildly elevated inflammatory factors - recheck. Still with decreased ROM though pain resolved. However, odd gait with toe stiffness resulting in increased knee pain - fit into right knee brace with some improveent  Orders Placed This Encounter  Procedures  . Comprehensive metabolic panel  . Sedimentation Rate  . C-reactive protein  . Uric acid  . Comprehensive metabolic panel  . POCT glycosylated hemoglobin (Hb A1C)    Meds ordered this encounter  Medications  . sulfamethoxazole-trimethoprim (BACTRIM DS,SEPTRA DS) 800-160 MG tablet    Sig: Take 1 tablet by mouth 2 (two) times daily.    Dispense:  28 tablet    Refill:  0  . HYDROcodone-acetaminophen (NORCO) 7.5-325 MG tablet    Sig: Take 1 tablet by mouth every 8 (eight) hours as needed for moderate pain.    Dispense:  90 tablet    Refill:  0    May fill 60 days from date written  . HYDROcodone-acetaminophen (NORCO) 7.5-325 MG tablet    Sig: Take 1 tablet by mouth every 8 (eight) hours as needed for moderate pain.    Dispense:  90 tablet    Refill:  0    May fill 30d from date written  . HYDROcodone-acetaminophen (NORCO) 7.5-325 MG tablet    Sig: Take 1 tablet by mouth every 8 (eight) hours as needed for moderate pain.    Dispense:  90 tablet    Refill:  0  . atorvastatin (LIPITOR) 40 MG tablet    Sig: Take 1  tablet (40 mg total) by mouth daily.    Dispense:  90 tablet    Refill:  1  . budesonide-formoterol (SYMBICORT) 160-4.5 MCG/ACT inhaler    Sig: Inhale 1 puff into the lungs 2 (two) times daily.    Dispense:  1 Inhaler    Refill:  12  . carvedilol (COREG) 12.5 MG tablet    Sig: TAKE ONE TABLET BY MOUTH TWICE DAILY WITH MEAL    Dispense:  180 tablet    Refill:  3    Please consider 90 day supplies to promote better adherence  . diazepam (VALIUM) 5 MG tablet    Sig: Take 1-2 tablets (5-10 mg total) by mouth at bedtime as needed for anxiety, muscle spasms or sedation.    Dispense:  180 tablet    Refill:  0    May fill on or after 10/16/16. Please fax or call in to [facility] Ph: [phone]  . gabapentin (NEURONTIN) 300 MG capsule    Sig: Take 1 capsule (300 mg total) by mouth 3 (three) times daily.    Dispense:  270 capsule    Refill:  1  . glimepiride (AMARYL) 4 MG tablet    Sig: Take 1 tablet (4 mg total) by mouth 2 (two) times daily.    Dispense:  180 tablet    Refill:  1  . Insulin Glargine (LANTUS SOLOSTAR) 100 UNIT/ML Solostar Pen    Sig: Inject 10 Units into the skin daily at 10 pm.    Dispense:  3 pen    Refill:  PRN  . Insulin Pen Needle (PEN NEEDLES) 32G X  4 MM MISC    Sig: 1 Units by Does not apply route daily.    Dispense:  30 each    Refill:  1  . ipratropium (ATROVENT HFA) 17 MCG/ACT inhaler    Sig: Inhale 2 puffs into the lungs 4 (four) times daily.    Dispense:  1 Inhaler    Refill:  12  . metFORMIN (GLUCOPHAGE) 1000 MG tablet    Sig: TAKE 1 TABLET BY MOUTH TWICE DAILY WITH A MEAL    Dispense:  180 tablet    Refill:  1  . ranitidine (ZANTAC) 150 MG tablet    Sig: Take 1 tablet (150 mg total) by mouth 2 (two) times daily.    Dispense:  180 tablet    Refill:  3  . traMADol (ULTRAM) 50 MG tablet    Sig: Take 2 tablets (100 mg total) by mouth every 6 (six) hours as needed for moderate pain.    Dispense:  240 tablet    Refill:  2       Delman Cheadle, M.D.  Primary Care at [facility] [place], [place] [address] [phone] phone [phone] fax  01/08/17 9:25 AM   Results for orders placed or performed in visit on 01/05/17  Sedimentation Rate  Result Value Ref Range   Sed Rate 45 (H) 0 - 40 mm/hr  C-reactive protein  Result Value Ref Range   CRP 5.2 (H) 0.0 - 4.9 mg/L  Uric acid  Result Value Ref Range   Uric Acid 7.5 (H) 2.5 - 7.1 mg/dL  Comprehensive metabolic panel  Result Value Ref Range   Glucose 111 (H) 65 - 99 mg/dL   BUN 19 6 - 24 mg/dL   Creatinine, Ser 1.28 (H) 0.57 - 1.00 mg/dL   GFR calc non Af Amer 46 (L) >59 mL/min/1.73   GFR calc Af Amer 53 (L) >59 mL/min/1.73   BUN/Creatinine Ratio 15 9 - 23   Sodium 133 (L) 134 - 144 mmol/L   Potassium 4.9 3.5 - 5.2 mmol/L   Chloride 93 (L) 96 - 106 mmol/L   CO2 25 20 - 29 mmol/L   Calcium 9.1 8.7 - 10.2 mg/dL   Total Protein 7.2 6.0 - 8.5 g/dL   Albumin 3.7 3.5 - 5.5 g/dL   Globulin, Total 3.5 1.5 - 4.5 g/dL   Albumin/Globulin Ratio 1.1 (L) 1.2 - 2.2   Bilirubin Total 0.2 0.0 - 1.2 mg/dL   Alkaline Phosphatase 112 39 - 117 IU/L   AST 14 0 - 40 IU/L   ALT 6 0 - 32 IU/L  POCT glycosylated hemoglobin (Hb A1C)  Result Value Ref Range   Hemoglobin A1C 6.8

## 2017-01-06 LAB — COMPREHENSIVE METABOLIC PANEL
ALBUMIN: 3.7 g/dL (ref 3.5–5.5)
ALK PHOS: 112 IU/L (ref 39–117)
ALT: 6 IU/L (ref 0–32)
AST: 14 IU/L (ref 0–40)
Albumin/Globulin Ratio: 1.1 — ABNORMAL LOW (ref 1.2–2.2)
BILIRUBIN TOTAL: 0.2 mg/dL (ref 0.0–1.2)
BUN / CREAT RATIO: 15 (ref 9–23)
BUN: 19 mg/dL (ref 6–24)
CHLORIDE: 93 mmol/L — AB (ref 96–106)
CO2: 25 mmol/L (ref 20–29)
Calcium: 9.1 mg/dL (ref 8.7–10.2)
Creatinine, Ser: 1.28 mg/dL — ABNORMAL HIGH (ref 0.57–1.00)
GFR calc Af Amer: 53 mL/min/{1.73_m2} — ABNORMAL LOW (ref >59)
GFR calc non Af Amer: 46 mL/min/{1.73_m2} — ABNORMAL LOW (ref >59)
GLOBULIN, TOTAL: 3.5 g/dL (ref 1.5–4.5)
Glucose: 111 mg/dL — ABNORMAL HIGH (ref 65–99)
POTASSIUM: 4.9 mmol/L (ref 3.5–5.2)
SODIUM: 133 mmol/L — AB (ref 134–144)
Total Protein: 7.2 g/dL (ref 6.0–8.5)

## 2017-01-06 LAB — SEDIMENTATION RATE: Sed Rate: 45 mm/h — ABNORMAL HIGH (ref 0–40)

## 2017-01-06 LAB — C-REACTIVE PROTEIN: CRP: 5.2 mg/L — AB (ref 0.0–4.9)

## 2017-01-06 LAB — URIC ACID: Uric Acid: 7.5 mg/dL — ABNORMAL HIGH (ref 2.5–7.1)

## 2017-01-11 ENCOUNTER — Ambulatory Visit (HOSPITAL_COMMUNITY)
Admission: RE | Admit: 2017-01-11 | Discharge: 2017-01-11 | Disposition: A | Discharge status: Home / Self Care | Payer: PPO | Source: Ambulatory Visit | Attending: Cardiology | Admitting: Cardiology

## 2017-01-11 ENCOUNTER — Encounter (HOSPITAL_COMMUNITY): Payer: Self-pay | Admitting: Cardiology

## 2017-01-11 VITALS — BP 124/78 | HR 61 | Wt 215.5 lb

## 2017-01-11 DIAGNOSIS — Z809 Family history of malignant neoplasm, unspecified: Secondary | ICD-10-CM | POA: Insufficient documentation

## 2017-01-11 DIAGNOSIS — Z720 Tobacco use: Secondary | ICD-10-CM | POA: Diagnosis not present

## 2017-01-11 DIAGNOSIS — Z836 Family history of other diseases of the respiratory system: Secondary | ICD-10-CM | POA: Insufficient documentation

## 2017-01-11 DIAGNOSIS — Z7902 Long term (current) use of antithrombotics/antiplatelets: Secondary | ICD-10-CM | POA: Diagnosis not present

## 2017-01-11 DIAGNOSIS — F17210 Nicotine dependence, cigarettes, uncomplicated: Secondary | ICD-10-CM | POA: Insufficient documentation

## 2017-01-11 DIAGNOSIS — Z833 Family history of diabetes mellitus: Secondary | ICD-10-CM | POA: Insufficient documentation

## 2017-01-11 DIAGNOSIS — E785 Hyperlipidemia, unspecified: Secondary | ICD-10-CM | POA: Diagnosis not present

## 2017-01-11 DIAGNOSIS — N183 Chronic kidney disease, stage 3 unspecified: Secondary | ICD-10-CM

## 2017-01-11 DIAGNOSIS — Z6834 Body mass index (BMI) 34.0-34.9, adult: Secondary | ICD-10-CM | POA: Insufficient documentation

## 2017-01-11 DIAGNOSIS — I429 Cardiomyopathy, unspecified: Secondary | ICD-10-CM | POA: Insufficient documentation

## 2017-01-11 DIAGNOSIS — I130 Hypertensive heart and chronic kidney disease with heart failure and stage 1 through stage 4 chronic kidney disease, or unspecified chronic kidney disease: Secondary | ICD-10-CM | POA: Diagnosis not present

## 2017-01-11 DIAGNOSIS — M1990 Unspecified osteoarthritis, unspecified site: Secondary | ICD-10-CM | POA: Insufficient documentation

## 2017-01-11 DIAGNOSIS — I5022 Chronic systolic (congestive) heart failure: Secondary | ICD-10-CM | POA: Diagnosis not present

## 2017-01-11 DIAGNOSIS — I252 Old myocardial infarction: Secondary | ICD-10-CM | POA: Diagnosis not present

## 2017-01-11 DIAGNOSIS — E669 Obesity, unspecified: Secondary | ICD-10-CM | POA: Insufficient documentation

## 2017-01-11 DIAGNOSIS — Z8371 Family history of colonic polyps: Secondary | ICD-10-CM | POA: Diagnosis not present

## 2017-01-11 DIAGNOSIS — E1122 Type 2 diabetes mellitus with diabetic chronic kidney disease: Secondary | ICD-10-CM | POA: Insufficient documentation

## 2017-01-11 DIAGNOSIS — Z8249 Family history of ischemic heart disease and other diseases of the circulatory system: Secondary | ICD-10-CM | POA: Diagnosis not present

## 2017-01-11 DIAGNOSIS — J449 Chronic obstructive pulmonary disease, unspecified: Secondary | ICD-10-CM | POA: Insufficient documentation

## 2017-01-11 DIAGNOSIS — Z86711 Personal history of pulmonary embolism: Secondary | ICD-10-CM | POA: Diagnosis not present

## 2017-01-11 DIAGNOSIS — E875 Hyperkalemia: Secondary | ICD-10-CM | POA: Insufficient documentation

## 2017-01-11 DIAGNOSIS — I5042 Chronic combined systolic (congestive) and diastolic (congestive) heart failure: Secondary | ICD-10-CM

## 2017-01-11 DIAGNOSIS — Z794 Long term (current) use of insulin: Secondary | ICD-10-CM | POA: Diagnosis not present

## 2017-01-11 MED ORDER — VARENICLINE TARTRATE 0.5 MG X 11 & 1 MG X 42 PO MISC: ORAL | 0 refills | Status: DC

## 2017-01-11 MED ORDER — VARENICLINE TARTRATE 1 MG PO TABS: 1.0000 mg | ORAL_TABLET | Freq: Two times a day (BID) | ORAL | 3 refills | Status: DC

## 2017-01-11 MED ORDER — CARVEDILOL 12.5 MG PO TABS: 12.5000 mg | ORAL_TABLET | Freq: Two times a day (BID) | ORAL | 6 refills | Status: DC

## 2017-01-11 NOTE — Patient Instructions (Signed)
Continue Entresto 49/51 mg tablet twice daily. Contact [facility] CH[facility] clinical pharmacist for any further questions/concerns/needs.  INCREASE Carvedilol (Coreg) to 12.5 mg TWICE daily.  START Chantix per bottle instructions for smoking cessation.  Follow up March 2019 with echocardiogram and appointment with Dr. Shirlee Latch. We will call you closer to this time, or you may call our office to schedule 1 month before you are due to be seen. Take all medication as prescribed the day of your appointment. Bring all medications with you to your appointment.  Do the following things EVERYDAY: 1) Weigh yourself in the morning before breakfast. Write it down and keep it in a log. 2) Take your medicines as prescribed 3) Eat low salt foods-Limit salt (sodium) to 2000 mg per day.  4) Stay as active as you can everyday 5) Limit all fluids for the day to less than 2 liters

## 2017-01-11 NOTE — Progress Notes (Signed)
Patient has run out of [employer] so I have enrolled her in a 2nd grant for $800 to use toward her Entresto copays.   Patient Name - Kaitlin Cantrell DOB - 12-20-1955 Member ID - [id number] Disease Fund - Heart Failure 2nd grant amount - $800  Total remaining balance - $800  Eligibility End Date - 02/21/2017  Claims Submission End Date - 06/21/2017    Tyler Deis. Bonnye Fava, PharmD, BCPS, CPP Clinical Pharmacist Pager: [phone] Phone: [phone] 01/11/2017 9:40 AM

## 2017-01-11 NOTE — Progress Notes (Signed)
PCP: Dr Delman Cheadle  Cardiology: Dr Aundra Dubin  HPI: Kaitlin Lippert McClintockis a 61 y.o.female with history of COPD, tobacco abuse, DM, HTN, arthritis, and chronic pain syndrome who presented to [facility] with SOB in 8/17.  She had had several months of increasing exertional dyspnea prior to this.  CT angio 12/29/15 showed submassive PE with concerns for right heart strain. Now on Xarelto.  Echo 12/30/15 showed LVEF 20-25%, Grade 3 DD, Mod MR, mod LAE, Mild RV dilation with mildly reduced systolic function, Moderate RAE, Moderate TR. Peak PA pressure 54 mm Hg.  Cardiac MRI (9/17) showed EF 15%, moderate LV dilation, mild RV dilation/moderately decreased RV systolic function, no LGE.  She was diuresed in the hospital and begun on Xarelto.  Smokes 1 ppd (x 42 years).  She tried Chantix but did not stop. No ETOH or drug use. No history of cancer or recent surgery, no long car/plane trips prior to her PE.   I stopped her spironolactone due to persistent hyperkalemia in 9/17.    RHC/LHC in 12/17 showed no significant CAD, optimized filling pressures, low but not markedly low cardiac output.   Echo was done today and reviewed.  EF improved to 55% with mild LVH.   She returns for followup of CHF.  She is only taking Coreg once a day, not sure why.  Weight is up considerably but she says she has been eating more and not watching what she is eating.  She has been under a lot of stress.  She has no dyspnea with her ADLs, able to walk on flat ground without problems.  No chest pain.  No orthopnea/PND.  No lightheadedness or dizziness.  No BRBPR/melena.    ECG (personally reviewed): NSR, LAFB, old ASMI.   PMH: 1. PE: 12/2015 CT angio 12/29/15 submassive PE with concerns for right heart strain. Now on Xarelto.  2. Cardiomyopathy:  Found 8/17.  - CMRI 01/01/2016: Nonischemic cardiomyopathy.  EF 15%, moderate LV dilation, mild RV dilation/moderately decreased RV systolic function.  No LGE: no infiltrative or inflammatory process  noted, no evidence for prior MI. - ECHO  12/30/2015: EF 20-25%. Grade III DD, Mod TR, PASP 54, mildly decreased RV systolic function.  - Hyperkalemia with spironolactone.  - LHC/RHC (12/17): No CAD, mean RA 2, PA 39/13 mean 23, mean PCWP 7, CI 2.1.  - Echo (3/18): EF 55%, mild LVH.  3. ABIs 01/05/2016 normal   4. COPD: Active smoker.  5. Type II diabetes 6. HTN 7. Chronic pain syndrome  Labs (9/17): SPEP negative, HIV negative, K 4.4, creatinine 1.22 Labs (10/17): LDL 111, HDL 32, TSH normal, K 5.2, creatinine 1.49, digoxin 0.7, BNP 245 Labs (12/17): K 4.4, creatinine 1.46 => 1.5, digoxin 0.9 Labs (1/18): K 4.6, creatinine 1.37, digoxin 1.3, LDL 83 Labs (9/18): K 4.9, creatinine 1.28   ROS: All systems negative except as listed in HPI, PMH and Problem List.  SH:  Social History   Social History  . Marital status: Married    Spouse name: N/A  . Number of children: 0  . Years of education: N/A   Occupational History  . retired    Social History Main Topics  . Smoking status: Current Every Day Smoker    Packs/day: 1.00    Years: 40.00    Types: Cigarettes  . Smokeless tobacco: Never Used  . Alcohol use Yes     Comment: rarely  . Drug use: No  . Sexual activity: Not on file   Other  Topics Concern  . Not on file   Social History Narrative   Exercise walking daily (varies)    FH:  Family History  Problem Relation Age of Onset  . Colon polyps Father   . Diabetes Father        mother  . Heart disease Father   . Heart murmur Mother   . Diabetes Mother   . Hypertension Mother   . Diabetes Brother   . Cancer Maternal Grandmother   . Diabetes Paternal Grandmother   . Heart disease Paternal Grandmother   . COPD Paternal Grandfather   . Heart disease Paternal Grandfather   . Hypertension Brother   . Hypertension Brother     Current Outpatient Prescriptions  Medication Sig Dispense Refill  . atorvastatin (LIPITOR) 40 MG tablet Take 1 tablet (40 mg total) by mouth  daily. 90 tablet 1  . blood glucose meter kit and supplies KIT Dispense based on patient and insurance preference. Use up to four times daily as directed. (FOR ICD-9 250.00, 250.01). 1 each 0  . budesonide-formoterol (SYMBICORT) 160-4.5 MCG/ACT inhaler Inhale 1 puff into the lungs 2 (two) times daily. 1 Inhaler 12  . carvedilol (COREG) 12.5 MG tablet Take 1 tablet (12.5 mg total) by mouth 2 (two) times daily with a meal. 60 tablet 6  . cyclobenzaprine (FLEXERIL) 10 MG tablet TAKE 1 TABLET BY MOUTH THREE TIMES DAILY AS NEEDED FOR MUSCLE SPASMS 270 tablet 0  . diazepam (VALIUM) 5 MG tablet Take 1-2 tablets (5-10 mg total) by mouth at bedtime as needed for anxiety, muscle spasms or sedation. 180 tablet 0  . diclofenac sodium (VOLTAREN) 1 % GEL Apply 2 g topically 4 (four) times daily. To right great toe 100 g 1  . ENTRESTO 49-51 MG TAKE ONE TABLET BY MOUTH TWICE DAILY 60 tablet 6  . furosemide (LASIX) 40 MG tablet Take 1 tablet (40 mg total) by mouth every other day. 45 tablet 3  . gabapentin (NEURONTIN) 300 MG capsule Take 1 capsule (300 mg total) by mouth 3 (three) times daily. 270 capsule 1  . glimepiride (AMARYL) 4 MG tablet Take 1 tablet (4 mg total) by mouth 2 (two) times daily. 180 tablet 1  . halobetasol (ULTRAVATE) 0.05 % cream Apply topically 2 (two) times daily. 50 g 0  . [START ON 03/04/2017] HYDROcodone-acetaminophen (NORCO) 7.5-325 MG tablet Take 1 tablet by mouth every 8 (eight) hours as needed for moderate pain. 90 tablet 0  . [START ON 02/03/2017] HYDROcodone-acetaminophen (NORCO) 7.5-325 MG tablet Take 1 tablet by mouth every 8 (eight) hours as needed for moderate pain. 90 tablet 0  . HYDROcodone-acetaminophen (NORCO) 7.5-325 MG tablet Take 1 tablet by mouth every 8 (eight) hours as needed for moderate pain. 90 tablet 0  . Insulin Glargine (LANTUS SOLOSTAR) 100 UNIT/ML Solostar Pen Inject 10 Units into the skin daily at 10 pm. 3 pen PRN  . Insulin Pen Needle (PEN NEEDLES) 32G X 4 MM MISC  1 Units by Does not apply route daily. 30 each 1  . ipratropium (ATROVENT HFA) 17 MCG/ACT inhaler Inhale 2 puffs into the lungs 4 (four) times daily. 1 Inhaler 12  . Krill Oil CAPS Take 1 capsule by mouth daily.     . metFORMIN (GLUCOPHAGE) 1000 MG tablet TAKE 1 TABLET BY MOUTH TWICE DAILY WITH A MEAL 180 tablet 1  . ranitidine (ZANTAC) 150 MG tablet Take 1 tablet (150 mg total) by mouth 2 (two) times daily. 180 tablet 3  . rivaroxaban (  XARELTO) 20 MG TABS tablet Take 1 tablet (20 mg total) by mouth daily. 90 tablet 3  . sulfamethoxazole-trimethoprim (BACTRIM DS,SEPTRA DS) 800-160 MG tablet Take 1 tablet by mouth 2 (two) times daily. 28 tablet 0  . traMADol (ULTRAM) 50 MG tablet Take 2 tablets (100 mg total) by mouth every 6 (six) hours as needed for moderate pain. 240 tablet 2  . [START ON 02/06/2017] varenicline (CHANTIX CONTINUING MONTH PAK) 1 MG tablet Take 1 tablet (1 mg total) by mouth 2 (two) times daily. 60 tablet 3  . varenicline (CHANTIX STARTING MONTH PAK) 0.5 MG X 11 & 1 MG X 42 tablet Take 0.5 mg tab by mouth once daily for 3 days, then increase to 0.5 mg tablet twice daily for 4 days, then increase to 1 mg tab twice daily 53 tablet 0   No current facility-administered medications for this encounter.     Vitals:   01/11/17 0918  BP: 124/78  Pulse: 61  SpO2: 95%  Weight: 215 lb 8 oz (97.8 kg)    PHYSICAL EXAM: General: NAD Neck: No JVD, no thyromegaly or thyroid nodule.  Lungs: Clear to auscultation bilaterally with normal respiratory effort. CV: Nondisplaced PMI.  Heart regular S1/S2, no S3/S4, no murmur.  No peripheral edema.  No carotid bruit.  Normal pedal pulses.  Abdomen: Soft, nontender, no hepatosplenomegaly, no distention.  Skin: Intact without lesions or rashes.  Neurologic: Alert and oriented x 3.  Psych: Normal affect. Extremities: No clubbing or cyanosis.  HEENT: Normal.   ASSESSMENT & PLAN: 1. H/O PE: Submassive on CT.  Etiology uncertain: no known cancer,  long trip, recent surgery.  She is fairly active. Possibly related to stasis in the setting of cardiomyopathy with severely decreased systolic function.  - Given cardiomyopathy and submassive PE with no certain trigger, think she will need long-term anticoagulation. She will continue Xarelto.  2. Chronic systolic CHF: Nonischemic cardiomyopathy.  ECHO 12/2015 EF 15-20% with diffuse hypokinesis.  Uncertain etiology.  No chest pain. Symptoms began as orthopnea around 6/17, then developed progressive exertional dyspnea. ECG shows anteroseptal MI but no change from 2013 ECG. Cardiac MRI did not show LGE. HIV negative, thyroid indices ok (mild TSH elevation but normal T3/T4), SPEP negative.  NYHA class II currently.  12/17 RHC/LHC showed no angiographic CAD, optimized filling pressures, low but not markedly low cardiac output.  Echo 3/18 showed recovery of LV function with EF up to 55%.  On exam today, she is not volume overloaded.  NYHA class II symptoms.   - Change Coreg back to 12.5 mg bid rather than daily.   - Continue Entresto 49/51 bid.   - Off spironolactone with persistent hyperkalemia.   - Continue Lasix 40 every other day. Recent BMET ok.  - I will have her repeat an echo in 3/19 to make sure that EF stays up.   3. CKD: Stage III.    4. Active smoker: Counseled to quit. Wants to try Chantix again.  I will give her a prescription.  5. Hyperlipidemia: Lipids acceptable in 1/18.  6. Obesity: Weight is up, think caloric rather than CHF.  Encouraged her to work on diet and exercise.   Followup in 6 months with echo.    Loralie Champagne 01/11/2017

## 2017-03-13 ENCOUNTER — Telehealth: Payer: Self-pay | Admitting: Family Medicine

## 2017-03-13 ENCOUNTER — Encounter: Payer: Self-pay | Admitting: Family Medicine

## 2017-03-13 NOTE — Telephone Encounter (Signed)
Copied from CRM [id number]. Topic: Quick Communication - See Telephone Encounter >> Mar 13, 2017  5:34 PM Cipriano Bunker wrote: CRM for notification. See Telephone encounter for: Pt went to have filled and Pharmacy would not fill due the what was on the prescription for Vicodin. At the top it was dated 11/2 but then in the message in on the prescription said to be filled 60 days from date seen which was in Sept. There is also on prescription Assoc. Dx sept 6. (patient is out and needs new prescription of Vicodin with corrected date)  03/13/17.

## 2017-03-14 MED ORDER — MUPIROCIN 2 % EX OINT: 1.0000 "application " | TOPICAL_OINTMENT | Freq: Four times a day (QID) | CUTANEOUS | 1 refills | Status: DC

## 2017-03-14 MED ORDER — HYDROCODONE-ACETAMINOPHEN 7.5-325 MG PO TABS: 1.0000 | ORAL_TABLET | Freq: Three times a day (TID) | ORAL | 0 refills | Status: DC | PRN

## 2017-03-14 MED ORDER — SULFAMETHOXAZOLE-TRIMETHOPRIM 800-160 MG PO TABS: 1.0000 | ORAL_TABLET | Freq: Two times a day (BID) | ORAL | 0 refills | Status: DC

## 2017-03-14 NOTE — Telephone Encounter (Signed)
Funny how they didn't have a problem with the rx that was written the same mo last mo.   Pt may pick up new rx at [facility] at her convenience - is ready.  Sent pt MyChart email alerting her as she had sent one in about this as well.

## 2017-04-01 ENCOUNTER — Other Ambulatory Visit: Payer: Self-pay | Admitting: Family Medicine

## 2017-04-03 NOTE — Telephone Encounter (Signed)
Controlled substance 

## 2017-04-17 ENCOUNTER — Telehealth: Payer: Self-pay | Admitting: Family Medicine

## 2017-04-17 ENCOUNTER — Other Ambulatory Visit (HOSPITAL_COMMUNITY): Payer: Self-pay | Admitting: Adult Health

## 2017-04-21 ENCOUNTER — Telehealth: Payer: Self-pay | Admitting: Family Medicine

## 2017-04-21 NOTE — Telephone Encounter (Signed)
Called pt to let her know that Dr. Clelia Croft has refilled her medication. She will need to schedule an office appt. Before any further refills will be given.  Please schedule pt if she calls in.  Thanks!

## 2017-05-15 ENCOUNTER — Ambulatory Visit: Payer: PPO | Admitting: Family Medicine

## 2017-05-17 ENCOUNTER — Encounter: Payer: Self-pay | Admitting: Family Medicine

## 2017-05-17 ENCOUNTER — Ambulatory Visit (INDEPENDENT_AMBULATORY_CARE_PROVIDER_SITE_OTHER): Payer: PPO | Admitting: Family Medicine

## 2017-05-17 VITALS — BP 118/71 | HR 76 | Temp 97.6°F | Resp 18 | Ht 66.0 in | Wt 231.4 lb

## 2017-05-17 DIAGNOSIS — R21 Rash and other nonspecific skin eruption: Secondary | ICD-10-CM

## 2017-05-17 DIAGNOSIS — Z79899 Other long term (current) drug therapy: Secondary | ICD-10-CM

## 2017-05-17 DIAGNOSIS — I10 Essential (primary) hypertension: Secondary | ICD-10-CM

## 2017-05-17 DIAGNOSIS — E785 Hyperlipidemia, unspecified: Secondary | ICD-10-CM

## 2017-05-17 DIAGNOSIS — E119 Type 2 diabetes mellitus without complications: Secondary | ICD-10-CM | POA: Diagnosis not present

## 2017-05-17 DIAGNOSIS — N183 Chronic kidney disease, stage 3 (moderate): Secondary | ICD-10-CM | POA: Diagnosis not present

## 2017-05-17 DIAGNOSIS — E1122 Type 2 diabetes mellitus with diabetic chronic kidney disease: Secondary | ICD-10-CM

## 2017-05-17 DIAGNOSIS — G894 Chronic pain syndrome: Secondary | ICD-10-CM

## 2017-05-17 DIAGNOSIS — L308 Other specified dermatitis: Secondary | ICD-10-CM | POA: Diagnosis not present

## 2017-05-17 LAB — POCT GLYCOSYLATED HEMOGLOBIN (HGB A1C): HEMOGLOBIN A1C: 6.6

## 2017-05-17 MED ORDER — HYDROCODONE-ACETAMINOPHEN 7.5-325 MG PO TABS: 1.0000 | ORAL_TABLET | Freq: Three times a day (TID) | ORAL | 0 refills | Status: DC | PRN

## 2017-05-17 MED ORDER — TRAMADOL HCL 50 MG PO TABS: ORAL_TABLET | ORAL | 2 refills | Status: DC

## 2017-05-17 NOTE — Progress Notes (Addendum)
Subjective:  By signing my name below, I, Kaitlin Cantrell, attest that this documentation has been prepared under the direction and in the presence of Delman Cheadle, MD Electronically Signed: Ladene Artist, ED Scribe 05/17/2017 at 10:39 AM.   Patient ID: Gardiner Coins, female    DOB: [birth year], 62 y.o.   MRN: [id number]  Chief Complaint  Patient presents with  . Diabetes  . Follow-up  . Medication Refill    Hydrocodone   HPI Kaitlin Cantrell is a 62 y.o. female who presents to Primary Care at [facility] for f/u. Filled hydrocodone 9/12, 10/11, 11/14, 12/15 #90 per month. Tramadol #240 per month 9/17, 10/14, 11/18, 12/23. Diazepam 5 bid #180 for 90 day supply filled 9/6, 12/5.  Rash to R Ankle Pt still has pruritic rash to medial R ankle which occasionally flares at least 3 times/yr x 2 yrs. She applied heat which has only worsened itching. States antibiotic ointment seems to make the rash spread.  DMII: Diagnosed .   Lab Results  Component Value Date   HGBA1C 6.6 05/17/2017   HGBA1C 6.8 01/05/2017   HGBA1C 7.8 09/29/2016   CBGs: has not been monitoring her blood glucose ; No hypoglycemic episodes.  Meter type:  Diet: has recently been eating a lot of fried foods cooked by her sister-in-law Exercising:  DM Med Regimen: Lantus 10 units Prior changes:   eGFR:  Baseline Cr:  Last checked . Microalb: Done . Normal. On acei Lipids:  LDL ,  non-HDL .  Last levels done  - were improving from prior. On statin. Taking asa 81 qd.  Optho: Due for appointment with [facility] Feet: Monofilament exam done . Denies any no problems.  Not seen by podiatry prior.  Immunizations:  Influenza:  Pneumovax-23:  Past Medical History:  Diagnosis Date  . Allergy    seasonal  . Arthritis   . Diabetes mellitus   . GERD (gastroesophageal reflux disease)   . History of pneumonia    15 years ago  . Hyperlipidemia   . Hypertension   . PE (pulmonary embolism) 12/2015   Current Outpatient  Medications on File Prior to Visit  Medication Sig Dispense Refill  . atorvastatin (LIPITOR) 40 MG tablet Take 1 tablet (40 mg total) by mouth daily. 90 tablet 1  . blood glucose meter kit and supplies KIT Dispense based on patient and insurance preference. Use up to four times daily as directed. (FOR ICD-9 250.00, 250.01). 1 each 0  . budesonide-formoterol (SYMBICORT) 160-4.5 MCG/ACT inhaler Inhale 1 puff into the lungs 2 (two) times daily. 1 Inhaler 12  . carvedilol (COREG) 12.5 MG tablet Take 1 tablet (12.5 mg total) by mouth 2 (two) times daily with a meal. 60 tablet 6  . cyclobenzaprine (FLEXERIL) 10 MG tablet TAKE 1 TABLET BY MOUTH THREE TIMES DAILY AS NEEDED FOR MUSCLE SPASMS 270 tablet 1  . diazepam (VALIUM) 5 MG tablet Take 1-2 tablets (5-10 mg total) by mouth at bedtime as needed for anxiety, muscle spasms or sedation. 180 tablet 0  . ENTRESTO 49-51 MG TAKE ONE TABLET BY MOUTH TWICE DAILY 60 tablet 6  . furosemide (LASIX) 40 MG tablet TAKE ONE TABLET BY MOUTH EVERY OTHER DAY 45 tablet 3  . gabapentin (NEURONTIN) 300 MG capsule Take 1 capsule (300 mg total) by mouth 3 (three) times daily. 270 capsule 1  . glimepiride (AMARYL) 4 MG tablet Take 1 tablet (4 mg total) by mouth 2 (two) times daily. 180 tablet 1  .  HYDROcodone-acetaminophen (NORCO) 7.5-325 MG tablet Take 1 tablet by mouth every 8 (eight) hours as needed for moderate pain. 90 tablet 0  . HYDROcodone-acetaminophen (NORCO) 7.5-325 MG tablet Take 1 tablet every 8 (eight) hours as needed by mouth for moderate pain. 90 tablet 0  . HYDROcodone-acetaminophen (NORCO) 7.5-325 MG tablet Take 1 tablet every 8 (eight) hours as needed by mouth for moderate pain. 90 tablet 0  . Insulin Glargine (LANTUS SOLOSTAR) 100 UNIT/ML Solostar Pen Inject 10 Units into the skin daily at 10 pm. 3 pen PRN  . Insulin Pen Needle (PEN NEEDLES) 32G X 4 MM MISC 1 Units by Does not apply route daily. 30 each 1  . ipratropium (ATROVENT HFA) 17 MCG/ACT inhaler  Inhale 2 puffs into the lungs 4 (four) times daily. 1 Inhaler 12  . Krill Oil CAPS Take 1 capsule by mouth daily.     . metFORMIN (GLUCOPHAGE) 1000 MG tablet TAKE 1 TABLET BY MOUTH TWICE DAILY WITH A MEAL 180 tablet 1  . mupirocin ointment (BACTROBAN) 2 % Apply 1 application 4 (four) times daily topically. 30 g 1  . ranitidine (ZANTAC) 150 MG tablet Take 1 tablet (150 mg total) by mouth 2 (two) times daily. 180 tablet 3  . rivaroxaban (XARELTO) 20 MG TABS tablet Take 1 tablet (20 mg total) by mouth daily. 90 tablet 3  . sulfamethoxazole-trimethoprim (BACTRIM DS,SEPTRA DS) 800-160 MG tablet Take 1 tablet 2 (two) times daily by mouth. 28 tablet 0  . traMADol (ULTRAM) 50 MG tablet TAKE 2 TABLETS BY MOUTH EVERY 6 HOURS AS NEEDED FOR MODERATE PAIN 240 tablet 0  . varenicline (CHANTIX CONTINUING MONTH PAK) 1 MG tablet Take 1 tablet (1 mg total) by mouth 2 (two) times daily. 60 tablet 3  . varenicline (CHANTIX STARTING MONTH PAK) 0.5 MG X 11 & 1 MG X 42 tablet Take 0.5 mg tab by mouth once daily for 3 days, then increase to 0.5 mg tablet twice daily for 4 days, then increase to 1 mg tab twice daily 53 tablet 0   No current facility-administered medications on file prior to visit.    Allergies  Allergen Reactions  . Albuterol Anaphylaxis    Throat/lips swelling  . Morphine And Related Other (See Comments)    Very aggressive and doesn't help pain   Past Surgical History:  Procedure Laterality Date  . ANKLE FRACTURE SURGERY  1974   left and pinned then pins removed  . ANKLE GANGLION CYST EXCISION  1994   right  . BACK SURGERY  1998   discectomy and then fusion  . CARDIAC CATHETERIZATION N/A 04/05/2016   Procedure: Right/Left Heart Cath and Coronary Angiography;  Surgeon: Larey Dresser, MD;  Location: [facility];  Service: Cardiovascular;  Laterality: N/A;  . KNEE ARTHROPLASTY  02/14/2012   Procedure: COMPUTER ASSISTED TOTAL KNEE ARTHROPLASTY;  Surgeon: Meredith Pel, MD;  Location:  [facility];  Service: Orthopedics;  Laterality: Left;  Left total knee arthroplasty  . TONSILLECTOMY  1972  . TUBAL LIGATION  1996  . UMBILICAL HERNIA REPAIR  1996   Family History  Problem Relation Age of Onset  . Colon polyps Father   . Diabetes Father        mother  . Heart disease Father   . Heart murmur Mother   . Diabetes Mother   . Hypertension Mother   . Diabetes Brother   . Cancer Maternal Grandmother   . Diabetes Paternal Grandmother   . Heart disease  Paternal Grandmother   . COPD Paternal Grandfather   . Heart disease Paternal Grandfather   . Hypertension Brother   . Hypertension Brother    Social History   Socioeconomic History  . Marital status: Married    Spouse name: None  . Number of children: 0  . Years of education: None  . Highest education level: None  Social Needs  . Financial resource strain: None  . Food insecurity - worry: None  . Food insecurity - inability: None  . Transportation needs - medical: None  . Transportation needs - non-medical: None  Occupational History  . Occupation: retired  Tobacco Use  . Smoking status: Current Every Day Smoker    Packs/day: 1.00    Years: 40.00    Pack years: 40.00    Types: Cigarettes  . Smokeless tobacco: Never Used  Substance and Sexual Activity  . Alcohol use: Yes    Comment: rarely  . Drug use: No  . Sexual activity: None  Other Topics Concern  . None  Social History Narrative   Exercise walking daily (varies)   Depression screen [facility] 2/9 01/05/2017 12/28/2016 12/14/2016 09/29/2016 07/09/2016  Decreased Interest 0 0 0 0 0  Down, Depressed, Hopeless 0 1 0 0 0  PHQ - 2 Score 0 1 0 0 0     Review of Systems  Skin: Positive for rash.      Objective:   Physical Exam  Constitutional: She is oriented to person, place, and time. She appears well-developed and well-nourished. No distress.  HENT:  Head: Normocephalic and atraumatic.  Eyes: Conjunctivae and EOM are normal.  Neck: Neck supple. No  tracheal deviation present.  Cardiovascular: Normal rate.  Pulmonary/Chest: Effort normal. No respiratory distress.  Decreased breath sounds throughout, worse in L lung base  Musculoskeletal: Normal range of motion.  Neurological: She is alert and oriented to person, place, and time.  Skin: Skin is warm and dry. Rash noted. Rash is macular and papular.  Macular papular well-defined rash on erythematous base with excoriations and local slivery scale over parts. ~ 4 x 6 cm over medial R ankle.  Psychiatric: She has a normal mood and affect. Her behavior is normal.  Nursing note and vitals reviewed.  BP 118/71   Pulse 76   Temp 97.6 F (36.4 C) (Oral)   Resp 18   Ht '5\' 6"'  (1.676 m)   Wt 231 lb 6.4 oz (105 kg)   SpO2 94%   BMI 37.35 kg/m      Results for orders placed or performed in visit on 05/17/17  POCT glycosylated hemoglobin (Hb A1C)  Result Value Ref Range   Hemoglobin A1C 6.6    Risks and benefits reviewed, and verbal informed consent obtained. Concentrated representative area of rash identified over right distal medial tibia. Cleansed with alcohol 3. Anesthesia with subcutaneous administration of 2cc 1% lidocaine with epinephrine. Shave biopsy obtained using dermablade to site approximately 1 cm diameter. Hemostasis achieved with pressure and Drysol application. Biopsy site dressed with mupirocin in a pressure bandage. EBL less than 1 mL. No complications. Patient tolerated procedure well. Wound care reviewed in detail and all questions answered.   Assessment & Plan:  Hydrocodone can be refilled today, 2/13, 3/13. Will refill diazepam x 3 months beginning of March.  1. Type 2 diabetes mellitus with stage 3 chronic kidney disease, without long-term current use of insulin ([place]) - despite weight gain and poor diet over the holidays, a1c well controlled on current regimen  so continue and pt will work on [facility] appt w/ optho  2. Encounter for medication management   3. Chronic  pain syndrome - stable, refilled, recheck in 38mo.  4. Hyperlipidemia, unspecified hyperlipidemia type - LDL has always been 80-90 and is again today. New LDL goal <70 so increase atorvastatin from 40 to 80.  5. Essential hypertension   6. Rash and nonspecific skin eruption - recurrent, responds to topical steroids. . . ??Fixed drug eruption?  Biopsy today, mupirocin to biopsy site but can use the top steroid cream she has at home on the other aspect of rash    Orders Placed This Encounter  Procedures  . Comprehensive metabolic panel    Order Specific Question:   Has the patient fasted?    Answer:   Yes  . Microalbumin/Creatinine Ratio, Urine  . Lipid panel    Order Specific Question:   Has the patient fasted?    Answer:   Yes  . POCT glycosylated hemoglobin (Hb A1C)  . HM DIABETES FOOT EXAM    Meds ordered this encounter  Medications  . HYDROcodone-acetaminophen (NORCO) 7.5-325 MG tablet    Sig: Take 1 tablet by mouth every 8 (eight) hours as needed for moderate pain.    Dispense:  90 tablet    Refill:  0  . HYDROcodone-acetaminophen (NORCO) 7.5-325 MG tablet    Sig: Take 1 tablet by mouth every 8 (eight) hours as needed for moderate pain.    Dispense:  90 tablet    Refill:  0  . HYDROcodone-acetaminophen (NORCO) 7.5-325 MG tablet    Sig: Take 1 tablet by mouth every 8 (eight) hours as needed for moderate pain.    Dispense:  90 tablet    Refill:  0  . traMADol (ULTRAM) 50 MG tablet    Sig: TAKE 2 TABLETS BY MOUTH EVERY 6 HOURS AS NEEDED FOR MODERATE PAIN    Dispense:  240 tablet    Refill:  2    I personally performed the services described in this documentation, which was scribed in my presence. The recorded information has been reviewed and considered, and addended by me as needed.   EDelman Cheadle M.D.  [facility] at [facility] [phone]  05/19/17 10:15 AM

## 2017-05-17 NOTE — Patient Instructions (Addendum)
Diabetes Mellitus and Nutrition When you have diabetes (diabetes mellitus), it is very important to have healthy eating habits because your blood sugar (glucose) levels are greatly affected by what you eat and drink. Eating healthy foods in the appropriate amounts, at about the same times every day, can help you:  Control your blood glucose.  Lower your risk of heart disease.  Improve your blood pressure.  Reach or maintain a healthy weight.  Every person with diabetes is different, and each person has different needs for a meal plan. Your health care provider may recommend that you work with a diet and nutrition specialist (dietitian) to make a meal plan that is best for you. Your meal plan may vary depending on factors such as:  The calories you need.  The medicines you take.  Your weight.  Your blood glucose, blood pressure, and cholesterol levels.  Your activity level.  Other health conditions you have, such as heart or kidney disease.  How do carbohydrates affect me? Carbohydrates affect your blood glucose level more than any other type of food. Eating carbohydrates naturally increases the amount of glucose in your blood. Carbohydrate counting is a method for keeping track of how many carbohydrates you eat. Counting carbohydrates is important to keep your blood glucose at a healthy level, especially if you use insulin or take certain oral diabetes medicines. It is important to know how many carbohydrates you can safely have in each meal. This is different for every person. Your dietitian can help you calculate how many carbohydrates you should have at each meal and for snack. Foods that contain carbohydrates include:  Bread, cereal, rice, pasta, and crackers.  Potatoes and corn.  Peas, beans, and lentils.  Milk and yogurt.  Fruit and juice.  Desserts, such as cakes, cookies, ice cream, and candy.  How does alcohol affect me? Alcohol can cause a sudden decrease in blood  glucose (hypoglycemia), especially if you use insulin or take certain oral diabetes medicines. Hypoglycemia can be a life-threatening condition. Symptoms of hypoglycemia (sleepiness, dizziness, and confusion) are similar to symptoms of having too much alcohol. If your health care provider says that alcohol is safe for you, follow these guidelines:  Limit alcohol intake to no more than 1 drink per day for nonpregnant women and 2 drinks per day for men. One drink equals 12 oz of beer, 5 oz of wine, or 1 oz of hard liquor.  Do not drink on an empty stomach.  Keep yourself hydrated with water, diet soda, or unsweetened iced tea.  Keep in mind that regular soda, juice, and other mixers may contain a lot of sugar and must be counted as carbohydrates.  What are tips for following this plan? Reading food labels  Start by checking the serving size on the label. The amount of calories, carbohydrates, fats, and other nutrients listed on the label are based on one serving of the food. Many foods contain more than one serving per package.  Check the total grams (g) of carbohydrates in one serving. You can calculate the number of servings of carbohydrates in one serving by dividing the total carbohydrates by 15. For example, if a food has 30 g of total carbohydrates, it would be equal to 2 servings of carbohydrates.  Check the number of grams (g) of saturated and trans fats in one serving. Choose foods that have low or no amount of these fats.  Check the number of milligrams (mg) of sodium in one serving. Most people   should limit total sodium intake to less than 2,300 mg per day.  Always check the nutrition information of foods labeled as "low-fat" or "nonfat". These foods may be higher in added sugar or refined carbohydrates and should be avoided.  Talk to your dietitian to identify your daily goals for nutrients listed on the label. Shopping  Avoid buying canned, premade, or processed foods. These  foods tend to be high in fat, sodium, and added sugar.  Shop around the outside edge of the grocery store. This includes fresh fruits and vegetables, bulk grains, fresh meats, and fresh dairy. Cooking  Use low-heat cooking methods, such as baking, instead of high-heat cooking methods like deep frying.  Cook using healthy oils, such as olive, canola, or sunflower oil.  Avoid cooking with butter, cream, or high-fat meats. Meal planning  Eat meals and snacks regularly, preferably at the same times every day. Avoid going long periods of time without eating.  Eat foods high in fiber, such as fresh fruits, vegetables, beans, and whole grains. Talk to your dietitian about how many servings of carbohydrates you can eat at each meal.  Eat 4-6 ounces of lean protein each day, such as lean meat, chicken, fish, eggs, or tofu. 1 ounce is equal to 1 ounce of meat, chicken, or fish, 1 egg, or 1/4 cup of tofu.  Eat some foods each day that contain healthy fats, such as avocado, nuts, seeds, and fish. Lifestyle   Check your blood glucose regularly.  Exercise at least 30 minutes 5 or more days each week, or as told by your health care provider.  Take medicines as told by your health care provider.  Do not use any products that contain nicotine or tobacco, such as cigarettes and e-cigarettes. If you need help quitting, ask your health care provider.  Work with a counselor or diabetes educator to identify strategies to manage stress and any emotional and social challenges. What are some questions to ask my health care provider?  Do I need to meet with a diabetes educator?  Do I need to meet with a dietitian?  What number can I call if I have questions?  When are the best times to check my blood glucose? Where to find more information:  American Diabetes Association: diabetes.org/food-and-fitness/food  Academy of Nutrition and Dietetics:  www.eatright.org/resources/health/diseases-and-conditions/diabetes  National Institute of Diabetes and Digestive and Kidney Diseases (NIH): www.niddk.nih.gov/health-information/diabetes/overview/diet-eating-physical-activity Summary  A healthy meal plan will help you control your blood glucose and maintain a healthy lifestyle.  Working with a diet and nutrition specialist (dietitian) can help you make a meal plan that is best for you.  Keep in mind that carbohydrates and alcohol have immediate effects on your blood glucose levels. It is important to count carbohydrates and to use alcohol carefully. This information is not intended to replace advice given to you by your health care provider. Make sure you discuss any questions you have with your health care provider. Document Released: 01/13/2005 Document Revised: 05/23/2016 Document Reviewed: 05/23/2016 Elsevier Interactive Patient Education  2018 [employer]     IF you received an x-ray today, you will receive an invoice from [facility]. Please contact [facility] at [phone] with questions or concerns regarding your invoice.   IF you received labwork today, you will receive an invoice from [facility]. Please contact [facility] at [phone] with questions or concerns regarding your invoice.   Our billing staff will not be able to assist you with questions regarding bills from these companies.    You will be contacted with the lab results as soon as they are available. The fastest way to get your results is to activate your My Chart account. Instructions are located on the last page of this paperwork. If you have not heard from [place] regarding the results in 2 weeks, please contact this office.     Skin Biopsy, Care After Refer to this sheet in the next few weeks. These instructions provide you with information about caring for yourself after your procedure. Your health care provider may also give you more specific  instructions. Your treatment has been planned according to current medical practices, but problems sometimes occur. Call your health care provider if you have any problems or questions after your procedure. What can I expect after the procedure? After the procedure, it is common to have: Soreness. Bruising. Itching.  Follow these instructions at home: Rest and then return to your normal activities as told by your health care provider. Take over-the-counter and prescription medicines only as told by your health care provider. Follow instructions from your health care provider about how to take care of your biopsy site.Make sure you: Wash your hands with soap and water before you change your bandage (dressing). If soap and water are not available, use hand sanitizer. Change your dressing as told by your health care provider. Leave stitches (sutures), skin glue, or adhesive strips in place. These skin closures may need to stay in place for 2 weeks or longer. If adhesive strip edges start to loosen and curl up, you may trim the loose edges. Do not remove adhesive strips completely unless your health care provider tells you to do that. If the biopsy area bleeds, apply gentle pressure for 10 minutes. Check your biopsy site every day for signs of infection. Check for: More redness, swelling, or pain. More fluid or blood. Warmth. Pus or a bad smell. Keep all follow-up visits as told by your health care provider. This is important. Contact a health care provider if: You have more redness, swelling, or pain around your biopsy site. You have more fluid or blood coming from your biopsy site. Your biopsy site feels warm to the touch. You have pus or a bad smell coming from your biopsy site. You have a fever. Get help right away if: You have bleeding that does not stop with pressure or a dressing. This information is not intended to replace advice given to you by your health care provider. Make sure  you discuss any questions you have with your health care provider. Document Released: 05/15/2015 Document Revised: 12/13/2015 Document Reviewed: 07/16/2014 Elsevier Interactive Patient Education  2018 [employer].  Skin Biopsy A skin biopsy is a procedure to remove a sample of your skin (lesion) so it can be checked under a microscope. You may need a skin biopsy if you have a skin disease or abnormal changes in your skin. Tell a health care provider about:  Any allergies you have.  All medicines you are taking, including vitamins, herbs, eye drops, creams, and over-the-counter medicines.  Any problems you or family members have had with anesthetic medicines.  Any blood disorders you have.  Any surgeries you have had.  Any medical conditions you have. What are the risks? Generally, this is a safe procedure. However, problems may occur, including:  Infection.  Bleeding.  Allergic reaction to medicines.  Scarring.  What happens before the procedure?  Ask your health care provider about changing or stopping your regular medicines. This is especially  important if you are taking diabetes medicines or blood thinners.  Follow instructions from your health care provider about how to care for your skin.  Ask your health care provider how your biopsy site will be marked or identified.  You may be given antibiotic medicine to prevent infection. What happens during the procedure?  To reduce your risk of infection: ? Your health care team will wash or sanitize their hands. ? Your skin will be washed with soap.  You may be given a medicine to help you relax (sedative).  You will be givena medicine to numb the area (local anesthetic).  The method that your health care provider will use for your skin biopsy will depend on the type of skin problem you have. Options include: ? Shave biopsy. Your health care provider will shave away layers of your skin lesion with a sharp blade.  After shaving, a gel or ointment may be used to control bleeding. ? Punch biopsy. Your health care provider will use a tool to remove all or part of the lesion. This leaves a small hole about the width of a pencil eraser. The area may be covered with a gel or ointment. ? Excisional or incisional biopsy. Your health care provider will use a surgical blade to remove all or part of your lesion.  Your skin biopsy site may be closed with stitches (sutures).  A bandage (dressing) will be applied. The procedure may vary among health care providers and hospitals. What happens after the procedure?  Your skin sample will be sent to a laboratory for examination.  Your skin biopsy site will be watched to make sure that it stops bleeding.  Do not drive for 24 hours if you received a sedative. This information is not intended to replace advice given to you by your health care provider. Make sure you discuss any questions you have with your health care provider. Document Released: 05/20/2004 Document Revised: 12/13/2015 Document Reviewed: 07/16/2014 Elsevier Interactive Patient Education  [employer].

## 2017-05-18 LAB — COMPREHENSIVE METABOLIC PANEL
A/G RATIO: 1.3 (ref 1.2–2.2)
ALBUMIN: 4 g/dL (ref 3.5–5.5)
ALT: <5 IU/L (ref 0–32)
AST: 12 IU/L (ref 0–40)
Alkaline Phosphatase: 109 IU/L (ref 39–117)
BUN / CREAT RATIO: 15 (ref 9–23)
BUN: 18 mg/dL (ref 6–24)
CHLORIDE: 100 mmol/L (ref 96–106)
CO2: 24 mmol/L (ref 20–29)
Calcium: 9.7 mg/dL (ref 8.7–10.2)
Creatinine, Ser: 1.24 mg/dL — ABNORMAL HIGH (ref 0.57–1.00)
GFR calc non Af Amer: 48 mL/min/{1.73_m2} — ABNORMAL LOW (ref >59)
GFR, EST AFRICAN AMERICAN: 55 mL/min/{1.73_m2} — AB (ref >59)
GLOBULIN, TOTAL: 3 g/dL (ref 1.5–4.5)
Glucose: 117 mg/dL — ABNORMAL HIGH (ref 65–99)
POTASSIUM: 5.5 mmol/L — AB (ref 3.5–5.2)
Sodium: 140 mmol/L (ref 134–144)
Total Protein: 7 g/dL (ref 6.0–8.5)

## 2017-05-18 LAB — MICROALBUMIN / CREATININE URINE RATIO
Creatinine, Urine: 22.3 mg/dL
Microalb/Creat Ratio: <13.5 mg/g creat (ref 0.0–30.0)
Microalbumin, Urine: <3 ug/mL

## 2017-05-18 LAB — LIPID PANEL
CHOLESTEROL TOTAL: 158 mg/dL (ref 100–199)
Chol/HDL Ratio: 4.4 ratio (ref 0.0–4.4)
HDL: 36 mg/dL — AB (ref >39)
LDL CALC: 98 mg/dL (ref 0–99)
Triglycerides: 122 mg/dL (ref 0–149)
VLDL CHOLESTEROL CAL: 24 mg/dL (ref 5–40)

## 2017-05-19 MED ORDER — ATORVASTATIN CALCIUM 80 MG PO TABS: 80.0000 mg | ORAL_TABLET | Freq: Every day | ORAL | 1 refills | Status: DC

## 2017-05-25 ENCOUNTER — Encounter: Payer: Self-pay | Admitting: Family Medicine

## 2017-06-16 IMAGING — MR MR CARD MORPHOLOGY WO/W CM
10 of 11 series · 15 of 16 positions shown · IV contrast (multihance)
Comparison: none

CLINICAL DATA: Cardiomyopathy of uncertain etiology.

EXAM:
CARDIAC MRI
TECHNIQUE: The patient was scanned on a 1.5 Tesla GE magnet. A dedicated
cardiac coil was used. Functional imaging was done using Fiest[employer]
sequences. [DATE], and 4 chamber views were done to assess for RWMA's.
Modified Aglind rule using a short axis stack was used to
calculate an ejection fraction on a dedicated work station using
Circle software. The patient received 29 cc of Multihance. After 10
minutes inversion recovery sequences were used to assess for
infiltration and scar tissue.
CONTRAST:  29 cc Multihance

[Series 3: bSSFP · sagittal · 8.0mm · 1.25mm/px · 1 of 18 slices shown (1 of 4)]
[im 1/18]
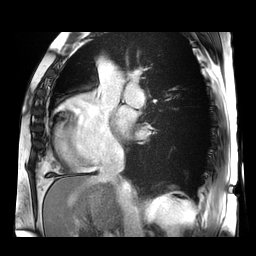

[Series 4: bSSFP · axial · 8.0mm · 1.45mm/px · 1 of 20 slices shown (2 of 4)]
[im 1/20]
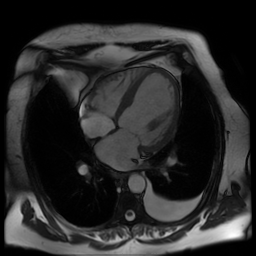

[Series 6: bSSFP · oblique · 8.0mm · 1.37mm/px · 6 of 420 slices shown (3 of 4)]
[im 1/420]
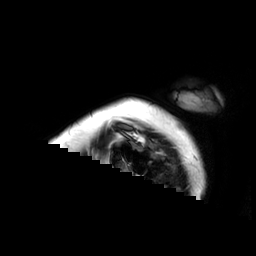
[im 84/420]
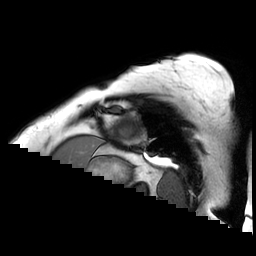
[im 168/420]
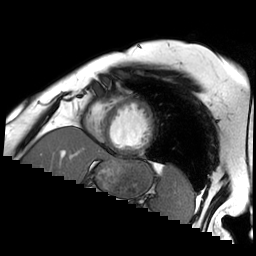
[im 252/420]
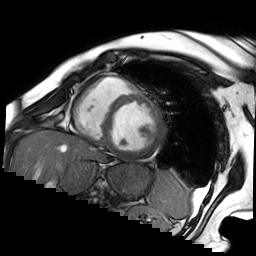
[im 336/420]
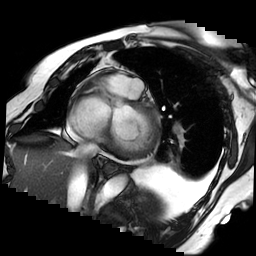
[im 420/420]
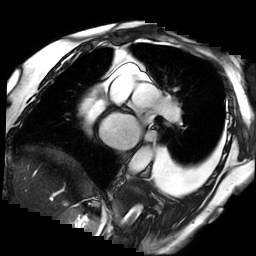

[Series 8: bSSFP · oblique · 8.0mm · 1.37mm/px · 1 of 60 slices shown (4 of 4)]
[im 1/60]
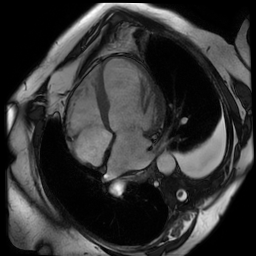

[Series 9: cine ir · oblique · 8.0mm · 1.41mm/px · 1 of 30 slices shown (1 of 2)]
[im 1/30]
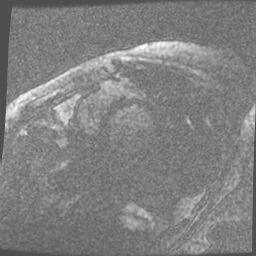

[Series 13: cine ir · oblique · 8.0mm · 1.41mm/px · 1 of 30 slices shown (2 of 2)]
[im 1/30]
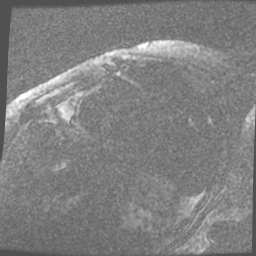

[Series 17: delayed ir prep · oblique · 8.0mm · 1.41mm/px · 1 of 13 slices shown (1 of 2)]
[im 1/13]
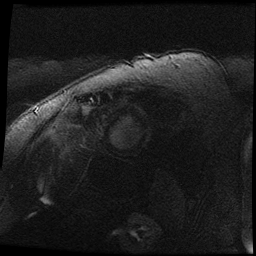

[Series 19: delayed ir prep · oblique · 8.0mm · 1.48mm/px · 1 of 17 slices shown (2 of 2)]
[im 1/17]
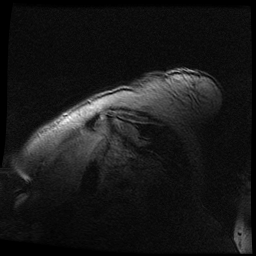

[Series 21: rad delayed ir · oblique · 8.0mm · 1.37mm/px · 1 of 3 slices shown (1 of 2)]
[im 1/3]
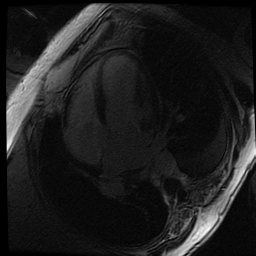

[Series 23: rad delayed ir · oblique · 8.0mm · 1.48mm/px · 1 of 3 slices shown (2 of 2)]
[im 1/3]
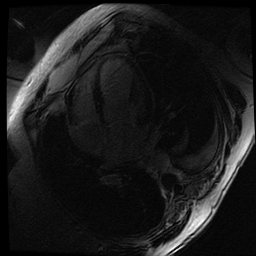

[15 of 16 positions shown; findings below may reference images not displayed]

FINDINGS: Small left pleural effusion.  Trivial pericardial effusion.

Moderately dilated left ventricle with severe diffuse hypokinesis.
EF 15%. Normal wall thickness. The right ventricle was mildly
dilated with moderately decreased systolic function. There was
moderate left atrial enlargement and mild right atrial enlargement.
There was moderate tricuspid regurgitation. There was at least
moderate central mitral regurgitation. Flow sequences to quantify
were not done.

Delayed enhancement images were difficult due to respiratory
artifact. However, there was no definite late gadolinium enhancement
(LGE) visualized.

MEASUREMENTS:
MEASUREMENTS
LV EDV 297 mL

LV SV 44 mL

LV EF 15%
IMPRESSION: 1. Moderately dilated left ventricle with severe diffuse
hypokinesis, EF 15%.

2. Mildly dilated right ventricle with moderately decreased systolic
function.

3.  Moderate TR and moderate MR.

4. Difficult images, but no definite LGE noted. Therefore, no
definitive evidence for prior MI, myocarditis, or infiltrative
disease.

[place] [employer] [facility]

## 2017-06-29 ENCOUNTER — Other Ambulatory Visit: Payer: Self-pay

## 2017-06-29 ENCOUNTER — Encounter: Payer: Self-pay | Admitting: Family Medicine

## 2017-06-29 ENCOUNTER — Ambulatory Visit (INDEPENDENT_AMBULATORY_CARE_PROVIDER_SITE_OTHER): Payer: PPO | Admitting: Family Medicine

## 2017-06-29 VITALS — BP 100/60 | HR 73 | Temp 98.2°F | Resp 18 | Ht 66.0 in | Wt 224.2 lb

## 2017-06-29 DIAGNOSIS — E119 Type 2 diabetes mellitus without complications: Secondary | ICD-10-CM

## 2017-06-29 DIAGNOSIS — Z5181 Encounter for therapeutic drug level monitoring: Secondary | ICD-10-CM | POA: Diagnosis not present

## 2017-06-29 DIAGNOSIS — R21 Rash and other nonspecific skin eruption: Secondary | ICD-10-CM | POA: Diagnosis not present

## 2017-06-29 LAB — GLUCOSE, POCT (MANUAL RESULT ENTRY): POC GLUCOSE: 144 mg/dL — AB (ref 70–99)

## 2017-06-29 LAB — POCT SEDIMENTATION RATE: POCT SED RATE: 57 mm/hr — AB (ref 0–22)

## 2017-06-29 MED ORDER — BETAMETHASONE DIPROPIONATE AUG 0.05 % EX LOTN: TOPICAL_LOTION | Freq: Two times a day (BID) | CUTANEOUS | 4 refills | Status: DC

## 2017-06-29 MED ORDER — HALOBETASOL PROPIONATE 0.05 % EX CREA: TOPICAL_CREAM | Freq: Two times a day (BID) | CUTANEOUS | 0 refills | Status: DC

## 2017-06-29 NOTE — Progress Notes (Signed)
Subjective:  By signing my name below, I, Kaitlin Cantrell, attest that this documentation has been prepared under the direction and in the presence of Delman Cheadle, MD Electronically Signed: Ladene Artist, ED Scribe 06/29/2017 at 4:34 PM.   Patient ID: Kaitlin Cantrell, female    DOB: Feb 05, 1956, 62 y.o.   MRN: [id number]  Chief Complaint  Patient presents with  . Rash    right ankle, pt states rash is not going away and is worse. Pt states location is getting bigger. Pt states rash itches and no discharge.  . Follow-up   HPI Kaitlin Cantrell is a 62 y.o. female who presents to Primary Care at [facility] complaining of gradually worsening and enlarging intermittently pruritic rash to the R ankle x 2 yrs, flared up again a few months ago. She has been applying diclofenac and peroxide to the area without any relief. Pt states that she applied halobetasol to the area several months ago with improvement of itching and rash. Denies drainage unless she scrubs a scab off.  Smoking Cessation Pt states that she was doing well on Chantisx and had decreased smoking by 1/2 but recently stopped due to increased heartburn and belching.  Past Medical History:  Diagnosis Date  . Allergy    seasonal  . Arthritis   . Diabetes mellitus   . GERD (gastroesophageal reflux disease)   . History of pneumonia    15 years ago  . Hyperlipidemia   . Hypertension   . PE (pulmonary embolism) 12/2015   Current Outpatient Medications on File Prior to Visit  Medication Sig Dispense Refill  . atorvastatin (LIPITOR) 80 MG tablet Take 1 tablet (80 mg total) by mouth daily at 6 PM. 90 tablet 1  . blood glucose meter kit and supplies KIT Dispense based on patient and insurance preference. Use up to four times daily as directed. (FOR ICD-9 250.00, 250.01). 1 each 0  . budesonide-formoterol (SYMBICORT) 160-4.5 MCG/ACT inhaler Inhale 1 puff into the lungs 2 (two) times daily. 1 Inhaler 12  . carvedilol (COREG) 12.5 MG tablet  Take 1 tablet (12.5 mg total) by mouth 2 (two) times daily with a meal. 60 tablet 6  . cyclobenzaprine (FLEXERIL) 10 MG tablet TAKE 1 TABLET BY MOUTH THREE TIMES DAILY AS NEEDED FOR MUSCLE SPASMS 270 tablet 1  . diazepam (VALIUM) 5 MG tablet Take 1-2 tablets (5-10 mg total) by mouth at bedtime as needed for anxiety, muscle spasms or sedation. 180 tablet 0  . ENTRESTO 49-51 MG TAKE ONE TABLET BY MOUTH TWICE DAILY 60 tablet 6  . furosemide (LASIX) 40 MG tablet TAKE ONE TABLET BY MOUTH EVERY OTHER DAY 45 tablet 3  . gabapentin (NEURONTIN) 300 MG capsule Take 1 capsule (300 mg total) by mouth 3 (three) times daily. 270 capsule 1  . glimepiride (AMARYL) 4 MG tablet Take 1 tablet (4 mg total) by mouth 2 (two) times daily. 180 tablet 1  . [START ON 07/12/2017] HYDROcodone-acetaminophen (NORCO) 7.5-325 MG tablet Take 1 tablet by mouth every 8 (eight) hours as needed for moderate pain. 90 tablet 0  . HYDROcodone-acetaminophen (NORCO) 7.5-325 MG tablet Take 1 tablet by mouth every 8 (eight) hours as needed for moderate pain. 90 tablet 0  . HYDROcodone-acetaminophen (NORCO) 7.5-325 MG tablet Take 1 tablet by mouth every 8 (eight) hours as needed for moderate pain. 90 tablet 0  . Insulin Glargine (LANTUS SOLOSTAR) 100 UNIT/ML Solostar Pen Inject 10 Units into the skin daily at 10 pm. 3 pen  PRN  . Insulin Pen Needle (PEN NEEDLES) 32G X 4 MM MISC 1 Units by Does not apply route daily. 30 each 1  . ipratropium (ATROVENT HFA) 17 MCG/ACT inhaler Inhale 2 puffs into the lungs 4 (four) times daily. 1 Inhaler 12  . Krill Oil CAPS Take 1 capsule by mouth daily.     . metFORMIN (GLUCOPHAGE) 1000 MG tablet TAKE 1 TABLET BY MOUTH TWICE DAILY WITH A MEAL 180 tablet 1  . mupirocin ointment (BACTROBAN) 2 % Apply 1 application 4 (four) times daily topically. 30 g 1  . ranitidine (ZANTAC) 150 MG tablet Take 1 tablet (150 mg total) by mouth 2 (two) times daily. 180 tablet 3  . rivaroxaban (XARELTO) 20 MG TABS tablet Take 1 tablet  (20 mg total) by mouth daily. 90 tablet 3  . traMADol (ULTRAM) 50 MG tablet TAKE 2 TABLETS BY MOUTH EVERY 6 HOURS AS NEEDED FOR MODERATE PAIN 240 tablet 2  . varenicline (CHANTIX CONTINUING MONTH PAK) 1 MG tablet Take 1 tablet (1 mg total) by mouth 2 (two) times daily. 60 tablet 3  . varenicline (CHANTIX STARTING MONTH PAK) 0.5 MG X 11 & 1 MG X 42 tablet Take 0.5 mg tab by mouth once daily for 3 days, then increase to 0.5 mg tablet twice daily for 4 days, then increase to 1 mg tab twice daily 53 tablet 0   No current facility-administered medications on file prior to visit.    Allergies  Allergen Reactions  . Albuterol Anaphylaxis    Throat/lips swelling  . Morphine And Related Other (See Comments)    Very aggressive and doesn't help pain   Past Surgical History:  Procedure Laterality Date  . ANKLE FRACTURE SURGERY  1974   left and pinned then pins removed  . ANKLE GANGLION CYST EXCISION  1994   right  . BACK SURGERY  1998   discectomy and then fusion  . CARDIAC CATHETERIZATION N/A 04/05/2016   Procedure: Right/Left Heart Cath and Coronary Angiography;  Surgeon: Larey Dresser, MD;  Location: [place] [facility];  Service: Cardiovascular;  Laterality: N/A;  . KNEE ARTHROPLASTY  02/14/2012   Procedure: COMPUTER ASSISTED TOTAL KNEE ARTHROPLASTY;  Surgeon: Meredith Pel, MD;  Location: [place];  Service: Orthopedics;  Laterality: Left;  Left total knee arthroplasty  . TONSILLECTOMY  1972  . TUBAL LIGATION  1996  . UMBILICAL HERNIA REPAIR  1996   Family History  Problem Relation Age of Onset  . Colon polyps Father   . Diabetes Father        mother  . Heart disease Father   . Heart murmur Mother   . Diabetes Mother   . Hypertension Mother   . Diabetes Brother   . Cancer Maternal Grandmother   . Diabetes Paternal Grandmother   . Heart disease Paternal Grandmother   . COPD Paternal Grandfather   . Heart disease Paternal Grandfather   . Hypertension Brother   . Hypertension  Brother    Social History   Socioeconomic History  . Marital status: Married    Spouse name: None  . Number of children: 0  . Years of education: None  . Highest education level: None  Social Needs  . Financial resource strain: None  . Food insecurity - worry: None  . Food insecurity - inability: None  . Transportation needs - medical: None  . Transportation needs - non-medical: None  Occupational History  . Occupation: retired  Tobacco Use  . Smoking  status: Current Every Day Smoker    Packs/day: 1.00    Years: 40.00    Pack years: 40.00    Types: Cigarettes  . Smokeless tobacco: Never Used  Substance and Sexual Activity  . Alcohol use: Yes    Comment: rarely  . Drug use: No  . Sexual activity: None  Other Topics Concern  . None  Social History Narrative   Exercise walking daily (varies)   Depression screen [facility] 2/9 06/29/2017 01/05/2017 12/28/2016 12/14/2016 09/29/2016  Decreased Interest 0 0 0 0 0  Down, Depressed, Hopeless 0 0 1 0 0  PHQ - 2 Score 0 0 1 0 0    Review of Systems  Skin: Positive for rash.      Objective:   Physical Exam  Constitutional: She is oriented to person, place, and time. She appears well-developed and well-nourished. No distress.  HENT:  Head: Normocephalic and atraumatic.  Eyes: Conjunctivae and EOM are normal.  Neck: Neck supple. No tracheal deviation present.  Cardiovascular: Normal rate, regular rhythm and normal heart sounds.  Pulmonary/Chest: Effort normal and breath sounds normal. No respiratory distress.  Musculoskeletal: Normal range of motion.  Neurological: She is alert and oriented to person, place, and time.  Skin: Skin is warm and dry. Rash noted.  Psychiatric: She has a normal mood and affect. Her behavior is normal.  Nursing note and vitals reviewed.  BP 100/60 (BP Location: Left Arm, Patient Position: Sitting, Cuff Size: Large)   Pulse 73   Temp 98.2 F (36.8 C) (Oral)   Resp 18   Ht '5\' 6"'  (1.676 m)   Wt 224 lb  3.2 oz (101.7 kg)   SpO2 96%   BMI 36.19 kg/m     Results for orders placed or performed in visit on 06/29/17  POCT glucose (manual entry)  Result Value Ref Range   POC Glucose 144 (A) 70 - 99 mg/dl   Assessment & Plan:   1. Medication monitoring encounter   2. Type 2 diabetes mellitus without complication, without long-term current use of insulin ([place])   3. Rash and nonspecific skin eruption     Orders Placed This Encounter  Procedures  . Comprehensive metabolic panel  . POCT glucose (manual entry)  . POCT SEDIMENTATION RATE    Meds ordered this encounter  Medications  . halobetasol (ULTRAVATE) 0.05 % cream    Sig: Apply topically 2 (two) times daily. For up to 2 weeks    Dispense:  50 g    Refill:  0  . betamethasone, augmented, (DIPROLENE) 0.05 % lotion    Sig: Apply topically 2 (two) times daily. After completing up to 2 weeks of the halobetasol.  Then use at first sign of ecurrence    Dispense:  60 mL    Refill:  4    Ok to dispense today with the halobetasol.    I personally performed the services described in this documentation, which was scribed in my presence. The recorded information has been reviewed and considered, and addended by me as needed.   Delman Cheadle, M.D.  Primary Care at [facility] [address] [place], [place] [address] [phone] phone [phone] fax  07/02/17 9:48 AM

## 2017-06-29 NOTE — Patient Instructions (Addendum)
We recommend that you schedule a mammogram for breast cancer screening. Typically, you do not need a referral to do this. Please contact a local imaging center to schedule your mammogram.  [facility] - [phone]  *ask for the Radiology Department [facility]) - [phone] or [phone]  [facility] - [phone] [facility] - [phone] [facility] - [phone]  *ask for the Radiology Department [facility] - [phone]  *ask for the Radiology Department [facility] - [phone]  *ask for the Mammography Department [facility] - [phone]   IF you received an x-ray today, you will receive an invoice from [facility]. Please contact [facility] at [phone] with questions or concerns regarding your invoice.   IF you received labwork today, you will receive an invoice from [facility]. Please contact [facility] at [phone] with questions or concerns regarding your invoice.   Our billing staff will not be able to assist you with questions regarding bills from these companies.  You will be contacted with the lab results as soon as they are available. The fastest way to get your results is to activate your My Chart account. Instructions are located on the last page of this paperwork. If you have not heard from [place] regarding the results in 2 weeks, please contact this office.     Nummular Eczema Nummular eczema is a common disease that causes red, circular, crusted (plaque) lesions that may be itchy. It most commonly affects the lower legs and the backs of hands. Men tend to get their first outbreak between 69 and 18 years of age, and women tend to get their first outbreak during their teen or young adult years. What are the causes? The cause of this condition is not known. It may be related to certain skin sensitivities, such as sensitivity  to:  Metals such as nickel and, rarely, mercury.  Formaldehyde.  Antibiotic medicine that is applied to the skin, such as neomycin.  What increases the risk? You are more likely to develop this condition if:  You have very dry skin.  You live in a place with dry and cold weather.  You have a personal or family history of eczema, asthma, or allergies.  You drink alcohol.  You have poor blood flow (circulation).  What are the signs or symptoms? Symptoms most commonly affect the lower legs, but may also affect the hands, torso, arms, or feet. Symptoms include:  Groups of tiny, red spots.  Blister-like sores that leak fluid. These sores may grow together and form circular patches. After a long time, they may become crusty and then scaly.  Well-defined patches of pink, red, or brown skin.  Itchiness and burning, ranging from mild to severe. Itchiness may be worse at night and may cause trouble sleeping. Scratching lesions can cause bleeding.  How is this diagnosed? This condition is diagnosed based on a physical exam and your medical history. Usually more tests are not needed, but you may need a swab test to check for skin infection. This test involves swabbing an affected area and testing the sample for bacteria (culture). You may work with a health care provider who specializes in the skin (dermatologist) to help diagnose and treat this condition. How is this treated? There is no cure for this condition, but treatment can help relieve symptoms. Depending on how severe your symptoms are, your healthcare provider may suggest:  Medicine applied to  the skin to reduce swelling and irritation (topical corticosteroids).  Medicine taken by mouth to reduce itching (oral antihistamines).  Antibiotic medicine to take by mouth (oral antibiotic) or to apply to your skin (topical antibiotic), if you have a skin infection.  Light therapy (phototherapy). This involves shining ultraviolet  (UV) light on affected skin to reduce itchiness and inflammation.  Soaking in a bath that contains a type of salt that dries out blisters (potassium permanganate soaks).  Follow these instructions at home: Medicines  Take over-the-counter and prescription medicines only as told by your health care provider.  If you were prescribed an antibiotic, take or apply it as told by your health care provider. Do not stop using the antibiotic even if you start to feel better. Skin Care  Keep your fingernails short to avoid breaking open the skin when you scratch.  Wash your hands with mild soap and water to avoid infection.  Pat your skin dry after bathing or washing your hands. Avoid rubbing your skin.  Keep your skin hydrated. To do this: ? Avoid very hot water. Take lukewarm baths or showers. ? Apply moisturizer within three minutes of bathing. This locks in moisture. ? Use a humidifier when you have the heating or air conditioning on. This will add moisture to the air.  Identify and avoid things that trigger symptoms or irritate your skin. Triggers may include taking long, hot showers or baths, or not using creams or ointments to moisturize. Certain soaps may also trigger this condition. General instructions  Dress in clothes made of cotton or cotton blends. Avoid wearing clothes with wool fabric.  Avoid activities that may cause skin injury. Wear protective clothing when doing outdoor activities such as gardening or hiking. Cuts, scrapes, and insect bites can make symptoms worse.  Keep all follow-up visits as told by your health care provider. This is important. Contact a health care provider if:  You develop a yellowish crust on an area of affected skin.  You have symptoms that do not go away with treatment or home care methods. Get help right away if:  You have more redness, pain, pus, or swelling. Summary  Nummular eczema is a common disease that causes red, circular, crusted  (plaque) lesions that may be itchy.  The cause of this condition is not known. It may be related to certain skin sensitivities.  Treatments may include medicines to reduce swelling and irritation, avoiding triggers, and keeping your skin hydrated. This information is not intended to replace advice given to you by your health care provider. Make sure you discuss any questions you have with your health care provider. Document Released: 09/01/2016 Document Revised: 09/01/2016 Document Reviewed: 09/01/2016 Elsevier Interactive Patient Education  2018 [employer].

## 2017-06-30 ENCOUNTER — Other Ambulatory Visit: Payer: Self-pay | Admitting: Family Medicine

## 2017-06-30 LAB — COMPREHENSIVE METABOLIC PANEL
ALBUMIN: 4 g/dL (ref 3.5–5.5)
ALK PHOS: 97 IU/L (ref 39–117)
ALT: 4 IU/L (ref 0–32)
AST: 12 IU/L (ref 0–40)
Albumin/Globulin Ratio: 1.3 (ref 1.2–2.2)
BUN/Creatinine Ratio: 16 (ref 9–23)
BUN: 25 mg/dL — AB (ref 6–24)
Bilirubin Total: <0.2 mg/dL (ref 0.0–1.2)
CALCIUM: 9.1 mg/dL (ref 8.7–10.2)
CO2: 23 mmol/L (ref 20–29)
CREATININE: 1.53 mg/dL — AB (ref 0.57–1.00)
Chloride: 98 mmol/L (ref 96–106)
GFR calc Af Amer: 43 mL/min/{1.73_m2} — ABNORMAL LOW (ref >59)
GFR, EST NON AFRICAN AMERICAN: 37 mL/min/{1.73_m2} — AB (ref >59)
GLOBULIN, TOTAL: 3 g/dL (ref 1.5–4.5)
GLUCOSE: 135 mg/dL — AB (ref 65–99)
Potassium: 5.6 mmol/L — ABNORMAL HIGH (ref 3.5–5.2)
SODIUM: 138 mmol/L (ref 134–144)
Total Protein: 7 g/dL (ref 6.0–8.5)

## 2017-07-18 ENCOUNTER — Other Ambulatory Visit (HOSPITAL_COMMUNITY): Payer: Self-pay | Admitting: Internal Medicine

## 2017-08-08 ENCOUNTER — Other Ambulatory Visit (HOSPITAL_COMMUNITY): Payer: Self-pay | Admitting: Cardiology

## 2017-08-18 ENCOUNTER — Telehealth: Payer: Self-pay

## 2017-08-18 NOTE — Telephone Encounter (Signed)
Atrovent PA started.

## 2017-08-22 NOTE — Telephone Encounter (Addendum)
Phone call to insurance company, Atrovent prior Berkley Harvey was approved. Medication ran by pharmacy earlier today.   Phone call to [employer] on [address]. Pharmacy notified of approval, states co-pay on medication is $160. Patient notified via MyChart.

## 2017-08-26 ENCOUNTER — Other Ambulatory Visit: Payer: Self-pay | Admitting: Family Medicine

## 2017-08-29 NOTE — Telephone Encounter (Signed)
Pt. Called regarding prescription.

## 2017-08-29 NOTE — Telephone Encounter (Signed)
Phone call to patient. Per signed authorization to leave detailed message, left voicemail stating purpose of call is to return her call regarding Atrovent. Did she have any questions regarding medication? Can call clinic back or send MyChart message.

## 2017-08-31 MED ORDER — HYDROCODONE-ACETAMINOPHEN 7.5-325 MG PO TABS: 1.0000 | ORAL_TABLET | Freq: Three times a day (TID) | ORAL | 0 refills | Status: DC | PRN

## 2017-09-06 ENCOUNTER — Other Ambulatory Visit: Payer: Self-pay | Admitting: Family Medicine

## 2017-09-06 NOTE — Telephone Encounter (Signed)
Gabapentin 300 mg and Ultram 50 mg refill requests  LOV 05/17/17 with Dr. Clelia Croft  Last refill for gabapentin is 01/05/17  #270 with 1 refill Last refill for Ultram 05/17/17  #240  With 2 refills

## 2017-09-07 ENCOUNTER — Other Ambulatory Visit: Payer: Self-pay | Admitting: Family Medicine

## 2017-09-08 NOTE — Telephone Encounter (Signed)
Lantus  refill Last OV: 05/17/17 Last Refill:01/05/17 #3 pen refill prn Pharmacy:[facility] [address] Norberto Sorenson

## 2017-09-08 NOTE — Telephone Encounter (Signed)
Pt is calling checking on status med refill. She only has enough to through the weekend. Please advise.

## 2017-09-25 ENCOUNTER — Other Ambulatory Visit: Payer: Self-pay | Admitting: Family Medicine

## 2017-09-26 NOTE — Telephone Encounter (Signed)
Request for Flexeril refill Last OV: 06/29/17 Last Refill:04/03/17 #270 with 1 refill Pharmacy: [facility] [address] [place]

## 2017-09-29 MED ORDER — DIAZEPAM 5 MG PO TABS: 5.0000 mg | ORAL_TABLET | Freq: Every evening | ORAL | 0 refills | Status: DC | PRN

## 2017-09-29 MED ORDER — HYDROCODONE-ACETAMINOPHEN 7.5-325 MG PO TABS: 1.0000 | ORAL_TABLET | Freq: Three times a day (TID) | ORAL | 0 refills | Status: DC | PRN

## 2017-09-29 NOTE — Telephone Encounter (Signed)
NEEDS OFFICE VISIT FOR REFILLS OF CONTROLLED MEDS - OK TO USE SAME DAY SPOT AS I'm SURE PT IS OUT.

## 2017-09-29 NOTE — Telephone Encounter (Signed)
**  NEED OFFICE VISIT WITHIN 30 DAYS FOR ANY ADDITIONAL REFILLS.**

## 2017-10-23 ENCOUNTER — Other Ambulatory Visit: Payer: Self-pay | Admitting: Family Medicine

## 2017-10-31 ENCOUNTER — Encounter: Payer: Self-pay | Admitting: Family Medicine

## 2017-10-31 ENCOUNTER — Ambulatory Visit (INDEPENDENT_AMBULATORY_CARE_PROVIDER_SITE_OTHER): Payer: PPO | Admitting: Family Medicine

## 2017-10-31 VITALS — BP 115/72 | HR 77 | Temp 98.4°F | Resp 17 | Ht 66.0 in | Wt 237.0 lb

## 2017-10-31 DIAGNOSIS — Z720 Tobacco use: Secondary | ICD-10-CM

## 2017-10-31 DIAGNOSIS — Z79899 Other long term (current) drug therapy: Secondary | ICD-10-CM | POA: Diagnosis not present

## 2017-10-31 DIAGNOSIS — E782 Mixed hyperlipidemia: Secondary | ICD-10-CM

## 2017-10-31 DIAGNOSIS — G894 Chronic pain syndrome: Secondary | ICD-10-CM

## 2017-10-31 DIAGNOSIS — N183 Chronic kidney disease, stage 3 unspecified: Secondary | ICD-10-CM

## 2017-10-31 DIAGNOSIS — I10 Essential (primary) hypertension: Secondary | ICD-10-CM | POA: Diagnosis not present

## 2017-10-31 DIAGNOSIS — F17210 Nicotine dependence, cigarettes, uncomplicated: Secondary | ICD-10-CM | POA: Diagnosis not present

## 2017-10-31 DIAGNOSIS — Z1231 Encounter for screening mammogram for malignant neoplasm of breast: Secondary | ICD-10-CM

## 2017-10-31 DIAGNOSIS — I130 Hypertensive heart and chronic kidney disease with heart failure and stage 1 through stage 4 chronic kidney disease, or unspecified chronic kidney disease: Secondary | ICD-10-CM | POA: Diagnosis not present

## 2017-10-31 DIAGNOSIS — E875 Hyperkalemia: Secondary | ICD-10-CM

## 2017-10-31 DIAGNOSIS — J449 Chronic obstructive pulmonary disease, unspecified: Secondary | ICD-10-CM | POA: Diagnosis not present

## 2017-10-31 DIAGNOSIS — Z79891 Long term (current) use of opiate analgesic: Secondary | ICD-10-CM | POA: Diagnosis not present

## 2017-10-31 DIAGNOSIS — E119 Type 2 diabetes mellitus without complications: Secondary | ICD-10-CM

## 2017-10-31 DIAGNOSIS — E1122 Type 2 diabetes mellitus with diabetic chronic kidney disease: Secondary | ICD-10-CM | POA: Diagnosis not present

## 2017-10-31 DIAGNOSIS — I5041 Acute combined systolic (congestive) and diastolic (congestive) heart failure: Secondary | ICD-10-CM

## 2017-10-31 LAB — POCT GLYCOSYLATED HEMOGLOBIN (HGB A1C): Hemoglobin A1C: 6.7 % — AB (ref 4.0–5.6)

## 2017-10-31 MED ORDER — TRAMADOL HCL 50 MG PO TABS: 100.0000 mg | ORAL_TABLET | Freq: Four times a day (QID) | ORAL | 2 refills | Status: DC | PRN

## 2017-10-31 MED ORDER — METFORMIN HCL 850 MG PO TABS: 850.0000 mg | ORAL_TABLET | Freq: Two times a day (BID) | ORAL | 1 refills | Status: DC

## 2017-10-31 MED ORDER — HYDROCODONE-ACETAMINOPHEN 7.5-325 MG PO TABS: 1.0000 | ORAL_TABLET | Freq: Three times a day (TID) | ORAL | 0 refills | Status: DC | PRN

## 2017-10-31 MED ORDER — PEN NEEDLES 32G X 4 MM MISC: 1.0000 [IU] | Freq: Every day | 1 refills | Status: DC

## 2017-10-31 MED ORDER — TIOTROPIUM BROMIDE MONOHYDRATE 2.5 MCG/ACT IN AERS: 1.0000 | INHALATION_SPRAY | Freq: Every day | RESPIRATORY_TRACT | 5 refills | Status: DC

## 2017-10-31 MED ORDER — INSULIN GLARGINE 100 UNIT/ML SOLOSTAR PEN: 15.0000 [IU] | PEN_INJECTOR | Freq: Every day | SUBCUTANEOUS | 1 refills | Status: DC

## 2017-10-31 MED ORDER — RANITIDINE HCL 150 MG PO TABS: 150.0000 mg | ORAL_TABLET | Freq: Two times a day (BID) | ORAL | 3 refills | Status: DC

## 2017-10-31 NOTE — Patient Instructions (Addendum)
Call for refills on rx today, 7/30, and 8/27.   IF you received an x-ray today, you will receive an invoice from [facility]. Please contact [facility] at [phone] with questions or concerns regarding your invoice.   IF you received labwork today, you will receive an invoice from [facility]. Please contact [facility] at [phone] with questions or concerns regarding your invoice.   Our billing staff will not be able to assist you with questions regarding bills from these companies.  You will be contacted with the lab results as soon as they are available. The fastest way to get your results is to activate your My Chart account. Instructions are located on the last page of this paperwork. If you have not heard from [place] regarding the results in 2 weeks, please contact this office.      Smoking Tobacco Information Smoking tobacco will very likely harm your health. Tobacco contains a poisonous (toxic), colorless chemical called nicotine. Nicotine affects the brain and makes tobacco addictive. This change in your brain can make it hard to stop smoking. Tobacco also has other toxic chemicals that can hurt your body and raise your risk of many cancers. How can smoking tobacco affect me? Smoking tobacco can increase your chances of having serious health conditions, such as:  Cancer. Smoking is most commonly associated with lung cancer, but can lead to cancer in other parts of the body.  Chronic obstructive pulmonary disease (COPD). This is a long-term lung condition that makes it hard to breathe. It also gets worse over time.  High blood pressure (hypertension), heart disease, stroke, or heart attack.  Lung infections, such as pneumonia.  Cataracts. This is when the lenses in the eyes become clouded.  Digestive problems. This may include peptic ulcers, heartburn, and gastroesophageal reflux disease (GERD).  Oral health problems, such as gum disease and tooth loss.  Loss  of taste and smell.  Smoking can affect your appearance by causing:  Wrinkles.  Yellow or stained teeth, fingers, and fingernails.  Smoking tobacco can also affect your social life.  Many workplaces, [employer], hotels, and public places are tobacco-free. This means that you may experience challenges in finding places to smoke when away from home.  The cost of a smoking habit can be expensive. Expenses for someone who smokes come in two ways: ? You spend money on a regular basis to buy tobacco. ? Your health care costs in the long-term are higher if you smoke.  Tobacco smoke can also affect the health of those around you. Children of smokers have greater chances of: ? Sudden infant death syndrome (SIDS). ? Ear infections. ? Lung infections.  What lifestyle changes can be made?  Do not start smoking. Quit if you already do.  To quit smoking: ? Make a plan to quit smoking and commit yourself to it. Look for programs to help you and ask your health care provider for recommendations and ideas. ? Talk with your health care provider about using nicotine replacement medicines to help you quit. Medicine replacement medicines include gum, lozenges, patches, sprays, or pills. ? Do not replace cigarette smoking with electronic cigarettes, which are commonly called e-cigarettes. The safety of e-cigarettes is not known, and some may contain harmful chemicals. ? Avoid places, people, or situations that tempt you to smoke. ? If you try to quit but return to smoking, don't give up hope. It is very common for people to try a number of times before they fully succeed. When you feel  ready again, give it another try.  Quitting smoking might affect the way you eat as well as your weight. Be prepared to monitor your eating habits. Get support in planning and following a healthy diet.  Ask your health care provider about having regular tests (screenings) to check for cancer. This may include blood tests,  imaging tests, and other tests.  Exercise regularly. Consider taking walks, joining a gym, or doing yoga or exercise classes.  Develop skills to manage your stress. These skills include meditation. What are the benefits of quitting smoking? By quitting smoking, you may:  Lower your risk of getting cancer and other diseases caused by smoking.  Live longer.  Breathe better.  Lower your blood pressure and heart rate.  Stop your addiction to tobacco.  Stop creating secondhand smoke that hurts other people.  Improve your sense of taste and smell.  Look better over time, due to having fewer wrinkles and less staining.  What can happen if changes are not made? If you do not stop smoking, you may:  Get cancer and other diseases.  Develop COPD or other long-term (chronic) lung conditions.  Develop serious problems with your heart and blood vessels (cardiovascular system).  Need more tests to screen for problems caused by smoking.  Have higher, long-term healthcare costs from medicines or treatments related to smoking.  Continue to have worsening changes in your lungs, mouth, and nose.  Where to find support: To get support to quit smoking, consider:  Asking your health care provider for more information and resources.  Taking classes to learn more about quitting smoking.  Looking for local organizations that offer resources about quitting smoking.  Joining a support group for people who want to quit smoking in your local community.  Where to find more information: You may find more information about quitting smoking from:  HelpGuide.org: www.helpguide.org/articles/addictions/how-to-quit-smoking.htm  [employer]: smokefree.gov  American Lung Association: www.lung.org  Contact a health care provider if:  You have problems breathing.  Your lips, nose, or fingers turn blue.  You have chest pain.  You are coughing up blood.  You feel faint or you pass  out.  You have other noticeable changes that cause you to worry. Summary  Smoking tobacco can negatively affect your health, the health of those around you, your finances, and your social life.  Do not start smoking. Quit if you already do. If you need help quitting, ask your health care provider.  Think about joining a support group for people who want to quit smoking in your local community. There are many effective programs that will help you to quit this behavior. This information is not intended to replace advice given to you by your health care provider. Make sure you discuss any questions you have with your health care provider. Document Released: 05/03/2016 Document Revised: 05/03/2016 Document Reviewed: 05/03/2016 Elsevier Interactive Patient Education  [employer].

## 2017-10-31 NOTE — Progress Notes (Addendum)
Subjective:  By signing my name below, I, Kaitlin Cantrell, attest that this documentation has been prepared under the direction and in the presence of Delman Cheadle, MD. Electronically Signed: Moises Cantrell, [place]. 10/31/2017 , 12:43 PM .  Patient was seen in Room 3 .   Patient ID: Kaitlin Cantrell, female    DOB: [birth year], 62 y.o.   MRN: [id number] Chief Complaint  Patient presents with  . medication fup   HPI Kaitlin Cantrell is a 62 y.o. female who presents to Primary Care at [facility] for follow up. She is fasting today.   Smoking She doesn't feel like she's there yet for smoking cessation. She's cut down smoking by half, stopped Chantix due to heartburn. Her smoking ranges from 3/4 ppd to 1 ppd, depending on her day. She's taking care of her nephew who is going through meth withdrawal and depression; he lives across the street from her.   Lower extremities swelling Her legs swell during the day when she's on them, but she's not keeping the swelling. Her swelling improves over night. She takes carvedilol 12.5 mg bid, and Entresto. She's taking Lasix every Monday, Wednesday, Friday and Saturday. She doesn't check her BP at home, unless she's not feeling well.   Her scripts are due 7/2, 7/30, and 8/27.   COPD She usually takes both Symbicort and Atrovent in the morning. Her breathing is doing well, but if she walks a lot, she does get a bit winded.   Diabetes She hasn't been checking her Cantrell sugars at home. She has the supplies but usually doesn't check if she's feeling okay. She's currently on Lantus 10 units and metformin 1000 mg bid. She's gained 13-14 lbs since her last visit; notes she dropped into her own depression. When she lays down in bed, she doesn't hurt as much and was eating late. But she's improved her routine and cutting out eating late, and is more active during the day helping her nephew.   Past Medical History:  Diagnosis Date  . Allergy    seasonal  . Arthritis     . Diabetes mellitus   . GERD (gastroesophageal reflux disease)   . History of pneumonia    15 years ago  . Hyperlipidemia   . Hypertension   . PE (pulmonary embolism) 12/2015   Past Surgical History:  Procedure Laterality Date  . ANKLE FRACTURE SURGERY  1974   left and pinned then pins removed  . ANKLE GANGLION CYST EXCISION  1994   right  . BACK SURGERY  1998   discectomy and then fusion  . CARDIAC CATHETERIZATION N/A 04/05/2016   Procedure: Right/Left Heart Cath and Coronary Angiography;  Surgeon: Larey Dresser, MD;  Location: [place] [facility];  Service: Cardiovascular;  Laterality: N/A;  . KNEE ARTHROPLASTY  02/14/2012   Procedure: COMPUTER ASSISTED TOTAL KNEE ARTHROPLASTY;  Surgeon: Meredith Pel, MD;  Location: [place];  Service: Orthopedics;  Laterality: Left;  Left total knee arthroplasty  . TONSILLECTOMY  1972  . TUBAL LIGATION  1996  . [place]   Prior to Admission medications   Medication Sig Start Date End Date Taking? Authorizing Provider  atorvastatin (LIPITOR) 80 MG tablet Take 1 tablet (80 mg total) by mouth daily at 6 PM. 05/19/17  Yes Shawnee Knapp, MD  betamethasone, augmented, (DIPROLENE) 0.05 % lotion Apply topically 2 (two) times daily. After completing up to 2 weeks of the halobetasol.  Then use at first sign  of ecurrence 06/29/17  Yes Shawnee Knapp, MD  Cantrell glucose meter kit and supplies KIT Dispense based on patient and insurance preference. Use up to four times daily as directed. (FOR ICD-9 250.00, 250.01). 07/13/16  Yes Shawnee Knapp, MD  budesonide-formoterol [facility]) 160-4.5 MCG/ACT inhaler Inhale 1 puff into the lungs 2 (two) times daily. 01/05/17  Yes Shawnee Knapp, MD  carvedilol (COREG) 12.5 MG tablet Take 1 tablet (12.5 mg total) by mouth 2 (two) times daily with a meal. 01/11/17  Yes Larey Dresser, MD  cyclobenzaprine (FLEXERIL) 10 MG tablet TAKE 1 TABLET BY MOUTH THREE TIMES DAILY AS NEEDED FOR MUSCLE SPASMS. 09/29/17  Yes Shawnee Knapp, MD  diazepam (VALIUM) 5 MG tablet Take 1 tablet (5 mg total) by mouth at bedtime as needed and may repeat dose one time if needed for muscle spasms or sedation. **NEED VISIT 09/29/17  Yes Shawnee Knapp, MD  ENTRESTO 49-51 MG TAKE 1 TABLET BY MOUTH TWICE DAILY 07/19/17  Yes Bensimhon, Shaune Pascal, MD  furosemide (LASIX) 40 MG tablet TAKE ONE TABLET BY MOUTH EVERY OTHER DAY 04/18/17  Yes Larey Dresser, MD  gabapentin (NEURONTIN) 300 MG capsule TAKE 1 CAPSULE BY MOUTH THREE TIMES DAILY 09/09/17  Yes Shawnee Knapp, MD  glimepiride (AMARYL) 4 MG tablet Take 1 tablet (4 mg total) by mouth 2 (two) times daily. 01/05/17  Yes Shawnee Knapp, MD  halobetasol (ULTRAVATE) 0.05 % cream Apply topically 2 (two) times daily. For up to 2 weeks 06/29/17  Yes Shawnee Knapp, MD  HYDROcodone-acetaminophen [facility]) 7.5-325 MG tablet Take 1 tablet by mouth every 8 (eight) hours as needed for moderate pain. 06/14/17  Yes Shawnee Knapp, MD  HYDROcodone-acetaminophen (NORCO) 7.5-325 MG tablet Take 1 tablet by mouth every 8 (eight) hours as needed for moderate pain. 05/17/17  Yes Shawnee Knapp, MD  HYDROcodone-acetaminophen (NORCO) 7.5-325 MG tablet Take 1 tablet by mouth every 8 (eight) hours as needed for moderate pain. **NEED OFFICE VISIT FOR ANY ADDITIONAL REFILLS.** 09/29/17  Yes Shawnee Knapp, MD  Insulin Glargine (LANTUS SOLOSTAR) 100 UNIT/ML Solostar Pen Inject 10 Units into the skin daily at 10 pm. 01/05/17  Yes Shawnee Knapp, MD  Insulin Pen Needle (PEN NEEDLES) 32G X 4 MM MISC 1 Units by Does not apply route daily. 01/05/17  Yes Shawnee Knapp, MD  ipratropium (ATROVENT HFA) 17 MCG/ACT inhaler Inhale 2 puffs into the lungs 4 (four) times daily. 01/05/17  Yes Shawnee Knapp, MD  Astrid Drafts CAPS Take 1 capsule by mouth daily.    Yes [provider]  LANTUS SOLOSTAR 100 UNIT/ML Solostar Pen INJECT 10 UNITS INTO THE SKIN DAILY AT 10 PM. TITRATE AS DIRECTED BY PHYSICIAN BASED ON Cantrell SUGAR RESPONSE 09/08/17  Yes Shawnee Knapp, MD  metFORMIN  (GLUCOPHAGE) 1000 MG tablet TAKE 1 TABLET BY MOUTH TWICE DAILY WITH A MEAL 10/24/17  Yes Shawnee Knapp, MD  mupirocin ointment (BACTROBAN) 2 % Apply 1 application 4 (four) times daily topically. 03/14/17  Yes Shawnee Knapp, MD  ranitidine (ZANTAC) 150 MG tablet Take 1 tablet (150 mg total) by mouth 2 (two) times daily. 01/05/17  Yes Shawnee Knapp, MD  traMADol (ULTRAM) 50 MG tablet TAKE 2 TABLETS BY MOUTH EVERY 6 HOURS AS NEEDED FOR MODERATE PAIN 09/09/17  Yes Shawnee Knapp, MD  varenicline (CHANTIX CONTINUING MONTH PAK) 1 MG tablet Take 1 tablet (1 mg total) by mouth 2 (two)  times daily. 02/06/17  Yes Larey Dresser, MD  varenicline (CHANTIX STARTING MONTH PAK) 0.5 MG X 11 & 1 MG X 42 tablet Take 0.5 mg tab by mouth once daily for 3 days, then increase to 0.5 mg tablet twice daily for 4 days, then increase to 1 mg tab twice daily 01/11/17  Yes Larey Dresser, MD  XARELTO 20 MG TABS tablet TAKE 1 TABLET BY MOUTH ONCE DAILY 08/10/17  Yes Bensimhon, Shaune Pascal, MD   Allergies  Allergen Reactions  . Albuterol Anaphylaxis    Throat/lips swelling  . Morphine And Related Other (See Comments)    Very aggressive and doesn't help pain   Family History  Problem Relation Age of Onset  . Colon polyps Father   . Diabetes Father        mother  . Heart disease Father   . Heart murmur Mother   . Diabetes Mother   . Hypertension Mother   . Diabetes Brother   . Cancer Maternal Grandmother   . Diabetes Paternal Grandmother   . Heart disease Paternal Grandmother   . COPD Paternal Grandfather   . Heart disease Paternal Grandfather   . Hypertension Brother   . Hypertension Brother    Social History   Socioeconomic History  . Marital status: Married    Spouse name: Not on file  . Number of children: 0  . Years of education: Not on file  . Highest education level: Not on file  Occupational History  . Occupation: retired  Scientific laboratory technician  . Financial resource strain: Not on file  . Food insecurity:    Worry:  Not on file    Inability: Not on file  . Transportation needs:    Medical: Not on file    Non-medical: Not on file  Tobacco Use  . Smoking status: Current Every Day Smoker    Packs/day: 1.00    Years: 40.00    Pack years: 40.00    Types: Cigarettes  . Smokeless tobacco: Never Used  Substance and Sexual Activity  . Alcohol use: Yes    Comment: rarely  . Drug use: No  . Sexual activity: Not on file  Lifestyle  . Physical activity:    Days per week: Not on file    Minutes per session: Not on file  . Stress: Not on file  Relationships  . Social connections:    Talks on phone: Not on file    Gets together: Not on file    Attends religious service: Not on file    Active member of club or organization: Not on file    Attends meetings of clubs or organizations: Not on file    Relationship status: Not on file  Other Topics Concern  . Not on file  Social History Narrative   Exercise walking daily (varies)   Depression screen [facility] 2/9 10/31/2017 06/29/2017 01/05/2017 12/28/2016 12/14/2016  Decreased Interest 0 0 0 0 0  Down, Depressed, Hopeless 0 0 0 1 0  PHQ - 2 Score 0 0 0 1 0    Review of Systems  Constitutional: Negative for chills, fatigue, fever and unexpected weight change.  Respiratory: Negative for cough.   Gastrointestinal: Negative for constipation, diarrhea, nausea and vomiting.  Skin: Negative for rash and wound.  Neurological: Negative for dizziness, weakness and headaches.       Objective:   Physical Exam  Constitutional: She is oriented to person, place, and time. She appears well-developed and well-nourished. No distress.  HENT:  Head: Normocephalic and atraumatic.  Eyes: Pupils are equal, round, and reactive to light. EOM are normal.  Neck: Neck supple.  Cardiovascular: Normal rate.  Pulmonary/Chest: Effort normal. No respiratory distress. She has decreased breath sounds. She has rhonchi (faint).  Decreased air movement throughout with bilateral faint rhonchi   Musculoskeletal: Normal range of motion.  Neurological: She is alert and oriented to person, place, and time.  Skin: Skin is warm and dry.  Psychiatric: She has a normal mood and affect. Her behavior is normal.  Nursing note and vitals reviewed.   BP 115/72   Pulse 77   Temp 98.4 F (36.9 C) (Oral)   Resp 17   Ht '5\' 6"'  (1.676 m)   Wt 237 lb (107.5 kg)   SpO2 98%   BMI 38.25 kg/m   Results for orders placed or performed in visit on 10/31/17  POCT glycosylated hemoglobin (Hb A1C)  Result Value Ref Range   Hemoglobin A1C 6.7 (A) 4.0 - 5.6 %   HbA1c POC (<> result, manual entry)  4.0 - 5.6 %   HbA1c, POC (prediabetic range)  5.7 - 6.4 %   HbA1c, POC (controlled diabetic range)  0.0 - 7.0 %       Assessment & Plan:   1. Encounter for screening mammogram for breast cancer   2. Type 2 diabetes mellitus without complication, without long-term current use of insulin ([place])   3. Essential hypertension   4. Acute combined systolic and diastolic congestive heart failure ([place])   5. Chronic obstructive pulmonary disease, unspecified COPD type ([place])   6. Mixed hyperlipidemia   7. Chronic pain syndrome   8. Tobacco abuse   9. Long term (current) use of opiate analgesic - hydrocodone will be at her pharmacy to be filled on or after today 7/2, 7/30, and 8/27.  Follow-up in late Sept for additional refills.  10. Long-term use of high-risk medication     Orders Placed This Encounter  Procedures  . Comprehensive metabolic panel  . Lipid panel    Order Specific Question:   Has the patient fasted?    Answer:   Yes  . ToxASSURE Select 13 (MW), Urine  . POCT glycosylated hemoglobin (Hb A1C)    Meds ordered this encounter  Medications  . HYDROcodone-acetaminophen (NORCO) 7.5-325 MG tablet    Sig: Take 1 tablet by mouth every 8 (eight) hours as needed for moderate pain.    Dispense:  90 tablet    Refill:  0  . HYDROcodone-acetaminophen (NORCO) 7.5-325 MG tablet    Sig: Take 1 tablet  by mouth every 8 (eight) hours as needed for moderate pain.    Dispense:  90 tablet    Refill:  0  . HYDROcodone-acetaminophen (NORCO) 7.5-325 MG tablet    Sig: Take 1 tablet by mouth every 8 (eight) hours as needed for moderate pain.    Dispense:  90 tablet    Refill:  0  . metFORMIN (GLUCOPHAGE) 850 MG tablet    Sig: Take 1 tablet (850 mg total) by mouth 2 (two) times daily with a meal.    Dispense:  180 tablet    Refill:  1    D/c prior rx for 1000g metformin  . Insulin Glargine (LANTUS SOLOSTAR) 100 UNIT/ML Solostar Pen    Sig: Inject 15 Units into the skin daily at 10 pm.    Dispense:  5 pen    Refill:  1  . traMADol (ULTRAM) 50 MG tablet  Sig: Take 2 tablets (100 mg total) by mouth every 6 (six) hours as needed for moderate pain.    Dispense:  240 tablet    Refill:  2  . ranitidine (ZANTAC) 150 MG tablet    Sig: Take 1 tablet (150 mg total) by mouth 2 (two) times daily.    Dispense:  180 tablet    Refill:  3  . Insulin Pen Needle (PEN NEEDLES) 32G X 4 MM MISC    Sig: 1 Units by Does not apply route daily.    Dispense:  30 each    Refill:  1  . Tiotropium Bromide Monohydrate (SPIRIVA RESPIMAT) 2.5 MCG/ACT AERS    Sig: Inhale 1 spray into the lungs daily.    Dispense:  1 Inhaler    Refill:  5    I personally performed the services described in this documentation, which was scribed in my presence. The recorded information has been reviewed and considered, and addended by me as needed.   Delman Cheadle, M.D.  Primary Care at [facility] [address] [place], [place] [address] [phone] phone [phone] fax  11/17/17 12:43 PM

## 2017-11-01 LAB — COMPREHENSIVE METABOLIC PANEL
A/G RATIO: 1.3 (ref 1.2–2.2)
ALBUMIN: 3.8 g/dL (ref 3.5–5.5)
ALT: 4 IU/L (ref 0–32)
AST: 12 IU/L (ref 0–40)
Alkaline Phosphatase: 100 IU/L (ref 39–117)
BILIRUBIN TOTAL: 0.2 mg/dL (ref 0.0–1.2)
BUN / CREAT RATIO: 19 (ref 9–23)
BUN: 29 mg/dL — ABNORMAL HIGH (ref 6–24)
CHLORIDE: 104 mmol/L (ref 96–106)
CO2: 23 mmol/L (ref 20–29)
Calcium: 9.4 mg/dL (ref 8.7–10.2)
Creatinine, Ser: 1.55 mg/dL — ABNORMAL HIGH (ref 0.57–1.00)
GFR calc non Af Amer: 36 mL/min/{1.73_m2} — ABNORMAL LOW (ref >59)
GFR, EST AFRICAN AMERICAN: 42 mL/min/{1.73_m2} — AB (ref >59)
Globulin, Total: 2.9 g/dL (ref 1.5–4.5)
Glucose: 103 mg/dL — ABNORMAL HIGH (ref 65–99)
POTASSIUM: 6 mmol/L — AB (ref 3.5–5.2)
Sodium: 139 mmol/L (ref 134–144)
TOTAL PROTEIN: 6.7 g/dL (ref 6.0–8.5)

## 2017-11-01 LAB — LIPID PANEL
Chol/HDL Ratio: 4 ratio (ref 0.0–4.4)
Cholesterol, Total: 141 mg/dL (ref 100–199)
HDL: 35 mg/dL — ABNORMAL LOW (ref >39)
LDL Calculated: 83 mg/dL (ref 0–99)
Triglycerides: 117 mg/dL (ref 0–149)
VLDL Cholesterol Cal: 23 mg/dL (ref 5–40)

## 2017-11-03 ENCOUNTER — Other Ambulatory Visit: Payer: Self-pay | Admitting: Family Medicine

## 2017-11-03 DIAGNOSIS — Z1231 Encounter for screening mammogram for malignant neoplasm of breast: Secondary | ICD-10-CM

## 2017-11-05 LAB — TOXASSURE SELECT 13 (MW), URINE

## 2017-11-11 ENCOUNTER — Other Ambulatory Visit: Payer: Self-pay | Admitting: Family Medicine

## 2017-11-13 NOTE — Telephone Encounter (Signed)
Valium 5 mg refill request  LOV 10/31/17 with Dr. Clelia Croft  Last refill:  09/29/17   #60  0 refills

## 2017-11-16 ENCOUNTER — Other Ambulatory Visit: Payer: Self-pay | Admitting: Family Medicine

## 2017-11-17 NOTE — Telephone Encounter (Signed)
Sent in 3 mos rx.

## 2017-11-21 ENCOUNTER — Ambulatory Visit
Admission: RE | Admit: 2017-11-21 | Discharge: 2017-11-21 | Disposition: A | Discharge status: Home / Self Care | Payer: PRIVATE HEALTH INSURANCE | Source: Ambulatory Visit | Attending: Family Medicine | Admitting: Family Medicine

## 2017-11-21 DIAGNOSIS — Z1231 Encounter for screening mammogram for malignant neoplasm of breast: Secondary | ICD-10-CM

## 2017-11-22 ENCOUNTER — Ambulatory Visit: Payer: PPO

## 2017-12-02 ENCOUNTER — Other Ambulatory Visit: Payer: Self-pay | Admitting: Family Medicine

## 2017-12-27 ENCOUNTER — Other Ambulatory Visit: Payer: Self-pay

## 2017-12-27 ENCOUNTER — Ambulatory Visit (INDEPENDENT_AMBULATORY_CARE_PROVIDER_SITE_OTHER): Payer: PPO

## 2017-12-27 ENCOUNTER — Ambulatory Visit (INDEPENDENT_AMBULATORY_CARE_PROVIDER_SITE_OTHER): Payer: PPO | Admitting: Family Medicine

## 2017-12-27 ENCOUNTER — Encounter: Payer: Self-pay | Admitting: Family Medicine

## 2017-12-27 VITALS — BP 126/72 | HR 81 | Temp 98.7°F | Resp 16 | Ht 65.75 in | Wt 239.0 lb

## 2017-12-27 DIAGNOSIS — N183 Chronic kidney disease, stage 3 unspecified: Secondary | ICD-10-CM

## 2017-12-27 DIAGNOSIS — M25462 Effusion, left knee: Secondary | ICD-10-CM

## 2017-12-27 DIAGNOSIS — M1712 Unilateral primary osteoarthritis, left knee: Secondary | ICD-10-CM

## 2017-12-27 DIAGNOSIS — M25562 Pain in left knee: Secondary | ICD-10-CM | POA: Diagnosis not present

## 2017-12-27 DIAGNOSIS — S8992XA Unspecified injury of left lower leg, initial encounter: Secondary | ICD-10-CM | POA: Diagnosis not present

## 2017-12-27 DIAGNOSIS — E2839 Other primary ovarian failure: Secondary | ICD-10-CM | POA: Diagnosis not present

## 2017-12-27 DIAGNOSIS — M7989 Other specified soft tissue disorders: Secondary | ICD-10-CM | POA: Diagnosis not present

## 2017-12-27 MED ORDER — FUROSEMIDE 40 MG PO TABS: ORAL_TABLET | ORAL | 3 refills | Status: DC

## 2017-12-27 MED ORDER — HYDROCODONE-ACETAMINOPHEN 7.5-325 MG PO TABS: 2.0000 | ORAL_TABLET | ORAL | 0 refills | Status: DC | PRN

## 2017-12-27 NOTE — Patient Instructions (Addendum)
If you have lab work done today you will be contacted with your lab results within the next 2 weeks.  If you have not heard from [place] then please contact us. The fastest way to get your results is to register for My Chart.   IF you received an x-ray today, you will receive an invoice from [facility]. Please contact [facility] at [phone] with questions or concerns regarding your invoice.   IF you received labwork today, you will receive an invoice from [place]. Please contact [facility] at [phone] with questions or concerns regarding your invoice.   Our billing staff will not be able to assist you with questions regarding bills from these companies.  You will be contacted with the lab results as soon as they are available. The fastest way to get your results is to activate your My Chart account. Instructions are located on the last page of this paperwork. If you have not heard from [place] regarding the results in 2 weeks, please contact this office.     Elastic Bandage and RICE What does an elastic bandage do? Elastic bandages come in different shapes and sizes. They generally provide support to your injury and reduce swelling while you are healing, but they can perform different functions. Your health care provider will help you to decide what is best for your protection, recovery, or rehabilitation following an injury. What are some general tips for using an elastic bandage?  Use the bandage as directed by the maker of the bandage that you are using.  Do not wrap the bandage too tightly. This may cut off the circulation in the arm or leg in the area below the bandage. ? If part of your body beyond the bandage becomes blue, numb, cold, swollen, or is more painful, your bandage is most likely too tight. If this occurs, remove your bandage and reapply it more loosely.  See your health care provider if the bandage seems to be making your problems worse rather than  better.  An elastic bandage should be removed and reapplied every 3-4 hours or as directed by your health care provider. What is RICE? The routine care of many injuries includes rest, ice, compression, and elevation (RICE therapy). Rest Rest is required to allow your body to heal. Generally, you can resume your routine activities when you are comfortable and have been given permission by your health care provider. Ice Icing your injury helps to keep the swelling down and it reduces pain. Do not apply ice directly to your skin.  Put ice in a plastic bag.  Place a towel between your skin and the bag.  Leave the ice on for 20 minutes, 2-3 times per day.  Do this for as long as you are directed by your health care provider. Compression Compression helps to keep swelling down, gives support, and helps with discomfort. Compression may be done with an elastic bandage. Elevation Elevation helps to reduce swelling and it decreases pain. If possible, your injured area should be placed at or above the level of your heart or the center of your chest. When should I seek medical care? You should seek medical care if:  You have persistent pain and swelling.  Your symptoms are getting worse rather than improving.  These symptoms may indicate that further evaluation or further X-rays are needed. Sometimes, X-rays may not show a small broken bone (fracture) until a number of days later. Make a follow-up appointment with your health care  provider. Ask when your X-ray results will be ready. Make sure that you get your X-ray results. When should I seek immediate medical care? You should seek immediate medical care if:  You have a sudden onset of severe pain at or below the area of your injury.  You develop redness or increased swelling around your injury.  You have tingling or numbness at or below the area of your injury that does not improve after you remove the elastic bandage.  This information  is not intended to replace advice given to you by your health care provider. Make sure you discuss any questions you have with your health care provider. Document Released: 10/08/2001 Document Revised: 03/14/2016 Document Reviewed: 12/02/2013 Elsevier Interactive Patient Education  2018 [employer].  [employer] are used to help with walking. Using a cane makes you more stable, reduces pain, and eases strain on certain muscle groups. It is important to use a cane that fits properly. A cane fits properly if the top of the cane comes to your wrist joint when you are standing upright with your arm relaxed at your side. How to use your cane Hold your cane in the hand opposite the injured or weaker side. Move the cane with the weaker foot at all times. Walking  Put as much weight on the cane as necessary to make walking comfortable, stable, and smooth.  Stand tall with good posture and look ahead, not down at your feet. Going Up Steps  Step first with your stronger foot.  Move the cane and the weaker foot up the step at the same time. Going Down Steps  Step first with the cane and your weaker foot.  Always use the railing with your free hand. Contact a health care provider if:  You still feel unsteady on your feet.  You develop new pain, for example in your back, shoulder, wrist, or hip.  You develop any numbness or tingling. Get help right away if: You fall. This information is not intended to replace advice given to you by your health care provider. Make sure you discuss any questions you have with your health care provider. Document Released: 04/18/2005 Document Revised: 09/30/2015 Document Reviewed: 12/20/2012 Elsevier Interactive Patient Education  2017 [employer].

## 2017-12-27 NOTE — Progress Notes (Signed)
Subjective:    Patient ID: Kaitlin Cantrell, female    DOB: 07/25/1955, 62 y.o.   MRN: [id number] Chief Complaint  Patient presents with  . Fall    pt states she had a fall on the 8/12 onto her left knee and it has been swollen and painful since.   . Knee Pain    left knee     HPI Her floor buckles and she stepped down not knowing it dipped and she fell forward onto left side - didn't realized she was falling so didn't try to stop herself and mainly landed on her left knee cap - 2 wks ago 3 wks ago she got a call that her aunt passed away when pt was at her bday party - she was running down the driveway hill with 2 people holding her she still fell forward onto right knww and feet were all tangled up.  Her back has been worse as she is limping - her chronic pain worsened from being jar.   Past Medical History:  Diagnosis Date  . Allergy    seasonal  . Arthritis   . Diabetes mellitus   . GERD (gastroesophageal reflux disease)   . History of pneumonia    15 years ago  . Hyperlipidemia   . Hypertension   . PE (pulmonary embolism) 12/2015   Past Surgical History:  Procedure Laterality Date  . ANKLE FRACTURE SURGERY  1974   left and pinned then pins removed  . ANKLE GANGLION CYST EXCISION  1994   right  . BACK SURGERY  1998   discectomy and then fusion  . CARDIAC CATHETERIZATION N/A 04/05/2016   Procedure: Right/Left Heart Cath and Coronary Angiography;  Surgeon: Larey Dresser, MD;  Location: [facility];  Service: Cardiovascular;  Laterality: N/A;  . KNEE ARTHROPLASTY  02/14/2012   Procedure: COMPUTER ASSISTED TOTAL KNEE ARTHROPLASTY;  Surgeon: Meredith Pel, MD;  Location: [facility];  Service: Orthopedics;  Laterality: Left;  Left total knee arthroplasty  . TONSILLECTOMY  1972  . TUBAL LIGATION  1996  . UMBILICAL HERNIA REPAIR  1996   Current Outpatient Medications on File Prior to Visit  Medication Sig Dispense Refill  . atorvastatin (LIPITOR) 80 MG tablet  TAKE 1 TABLET BY MOUTH ONCE DAILY AT 6PM 90 tablet 1  . betamethasone, augmented, (DIPROLENE) 0.05 % lotion Apply topically 2 (two) times daily. After completing up to 2 weeks of the halobetasol.  Then use at first sign of ecurrence 60 mL 4  . blood glucose meter kit and supplies KIT Dispense based on patient and insurance preference. Use up to four times daily as directed. (FOR ICD-9 250.00, 250.01). 1 each 0  . budesonide-formoterol (SYMBICORT) 160-4.5 MCG/ACT inhaler Inhale 1 puff into the lungs 2 (two) times daily. 1 Inhaler 12  . cyclobenzaprine (FLEXERIL) 10 MG tablet TAKE 1 TABLET BY MOUTH THREE TIMES DAILY AS NEEDED FOR MUSCLE SPASMS. 270 tablet 1  . ENTRESTO 49-51 MG TAKE 1 TABLET BY MOUTH TWICE DAILY 60 tablet 6  . gabapentin (NEURONTIN) 300 MG capsule TAKE 1 CAPSULE BY MOUTH THREE TIMES DAILY 270 capsule 1  . halobetasol (ULTRAVATE) 0.05 % cream Apply topically 2 (two) times daily. For up to 2 weeks 50 g 0  . HYDROcodone-acetaminophen (NORCO) 7.5-325 MG tablet Take 1 tablet by mouth every 8 (eight) hours as needed for moderate pain. 90 tablet 0  . HYDROcodone-acetaminophen (NORCO) 7.5-325 MG tablet Take 1 tablet by mouth every 8 (eight)  hours as needed for moderate pain. 90 tablet 0  . Insulin Glargine (LANTUS SOLOSTAR) 100 UNIT/ML Solostar Pen Inject 15 Units into the skin daily at 10 pm. 5 pen 1  . Insulin Pen Needle (PEN NEEDLES) 32G X 4 MM MISC 1 Units by Does not apply route daily. 30 each 1  . Krill Oil CAPS Take 1 capsule by mouth daily.     . metFORMIN (GLUCOPHAGE) 850 MG tablet Take 1 tablet (850 mg total) by mouth 2 (two) times daily with a meal. 180 tablet 1  . mupirocin ointment (BACTROBAN) 2 % Apply 1 application 4 (four) times daily topically. 30 g 1  . ranitidine (ZANTAC) 150 MG tablet Take 1 tablet (150 mg total) by mouth 2 (two) times daily. 180 tablet 3  . Tiotropium Bromide Monohydrate (SPIRIVA RESPIMAT) 2.5 MCG/ACT AERS Inhale 1 spray into the lungs daily. 1 Inhaler 5    . traMADol (ULTRAM) 50 MG tablet Take 2 tablets (100 mg total) by mouth every 6 (six) hours as needed for moderate pain. 240 tablet 2  . XARELTO 20 MG TABS tablet TAKE 1 TABLET BY MOUTH ONCE DAILY 30 tablet 11   No current facility-administered medications on file prior to visit.    Allergies  Allergen Reactions  . Albuterol Anaphylaxis    Throat/lips swelling  . Morphine And Related Other (See Comments)    Very aggressive and doesn't help pain   Family History  Problem Relation Age of Onset  . Colon polyps Father   . Diabetes Father        mother  . Heart disease Father   . Heart murmur Mother   . Diabetes Mother   . Hypertension Mother   . Diabetes Brother   . Cancer Maternal Grandmother   . Diabetes Paternal Grandmother   . Heart disease Paternal Grandmother   . COPD Paternal Grandfather   . Heart disease Paternal Grandfather   . Hypertension Brother   . Hypertension Brother    Social History   Socioeconomic History  . Marital status: Married    Spouse name: Not on file  . Number of children: 0  . Years of education: Not on file  . Highest education level: Not on file  Occupational History  . Occupation: retired  Scientific laboratory technician  . Financial resource strain: Not on file  . Food insecurity:    Worry: Not on file    Inability: Not on file  . Transportation needs:    Medical: Not on file    Non-medical: Not on file  Tobacco Use  . Smoking status: Current Every Day Smoker    Packs/day: 1.00    Years: 40.00    Pack years: 40.00    Types: Cigarettes  . Smokeless tobacco: Never Used  Substance and Sexual Activity  . Alcohol use: Yes    Comment: rarely  . Drug use: No  . Sexual activity: Not on file  Lifestyle  . Physical activity:    Days per week: Not on file    Minutes per session: Not on file  . Stress: Not on file  Relationships  . Social connections:    Talks on phone: Not on file    Gets together: Not on file    Attends religious service: Not on  file    Active member of club or organization: Not on file    Attends meetings of clubs or organizations: Not on file    Relationship status: Not on file  Other  Topics Concern  . Not on file  Social History Narrative   Exercise walking daily (varies)   Depression screen [facility] 2/9 12/27/2017 10/31/2017 06/29/2017 01/05/2017 12/28/2016  Decreased Interest 1 0 0 0 0  Down, Depressed, Hopeless 1 0 0 0 1  PHQ - 2 Score 2 0 0 0 1  Altered sleeping 0 - - - -  Tired, decreased energy 0 - - - -  Change in appetite 0 - - - -  Feeling bad or failure about yourself  1 - - - -  Trouble concentrating 0 - - - -  Moving slowly or fidgety/restless 0 - - - -  Suicidal thoughts 0 - - - -  PHQ-9 Score 3 - - - -     Review of Systems See hpi    Objective:   Physical Exam  Constitutional: She is oriented to person, place, and time. She appears well-developed and well-nourished. No distress.  HENT:  Head: Normocephalic and atraumatic.  Right Ear: External ear normal.  Left Ear: External ear normal.  Eyes: Conjunctivae are normal. No scleral icterus.  Neck: Normal range of motion. Neck supple. No thyromegaly present.  Cardiovascular: Normal rate, regular rhythm, normal heart sounds and intact distal pulses.  Pulmonary/Chest: Effort normal and breath sounds normal. No respiratory distress.  Musculoskeletal: She exhibits no edema.  Lymphadenopathy:    She has no cervical adenopathy.  Neurological: She is alert and oriented to person, place, and time.  Skin: Skin is warm and dry. She is not diaphoretic. No erythema.  Psychiatric: She has a normal mood and affect. Her behavior is normal.   BP 126/72   Pulse 81   Temp 98.7 F (37.1 C) (Oral)   Resp 16   Ht 5' 5.75" (1.67 m)   Wt 239 lb (108.4 kg)   SpO2 94%   BMI 38.87 kg/m   CLINICAL DATA:  LEFT knee injury, fell 13 days ago, pain and swelling, post LEFT total knee arthroplasty 02/14/2012  EXAM: LEFT KNEE - 1-2 VIEW  COMPARISON:   11/29/2007  FINDINGS: Osseous demineralization.  Components of a LEFT knee prosthesis are identified in expected positions.  No acute fracture, dislocation, or bone destruction.  No periprosthetic lucency or definite knee joint effusion.  Prepatellar soft tissue swelling identified extending medially.  IMPRESSION: Osseous demineralization with interval placement of LEFT knee prosthesis.  No acute osseous abnormalities.   Electronically Signed   By: Lavonia Dana M.D.   On: 12/27/2017 17:24     Assessment & Plan:   1. Primary osteoarthritis of left knee   2. Effusion of left knee   3. Estrogen deficiency   4. Stage 3 chronic kidney disease ([place])     Orders Placed This Encounter  Procedures  . DG Knee 1-2 Views Left    Standing Status:   Future    Number of Occurrences:   1    Standing Expiration Date:   12/27/2018    Order Specific Question:   Reason for Exam (SYMPTOM  OR DIAGNOSIS REQUIRED)    Answer:   pain and swelling, h/o arthritis    Order Specific Question:   Is the patient pregnant?    Answer:   No    Order Specific Question:   Preferred imaging location?    Answer:   External  . DG Bone Density    INS?/PF? COSIGNED    Standing Status:   Future    Standing Expiration Date:   02/27/2019  Order Specific Question:   Reason for Exam (SYMPTOM  OR DIAGNOSIS REQUIRED)    Answer:   estrogen deficiency    Order Specific Question:   Is the patient pregnant?    Answer:   No    Order Specific Question:   Preferred imaging location?    Answer:   [facility]  . Basic metabolic panel    Order Specific Question:   Has the patient fasted?    Answer:   No    Meds ordered this encounter  Medications  . HYDROcodone-acetaminophen (NORCO) 7.5-325 MG tablet    Sig: Take 2 tablets by mouth every 4 (four) hours as needed for moderate pain.    Dispense:  60 tablet    Refill:  0    TEMPORARY INCREASE DUE ACUTE PAIN FROM FALL AND HEMATOMA  . furosemide  (LASIX) 40 MG tablet    Sig: Take 1 tab po every Mon, Wed, Fri, and Sat    Dispense:  50 tablet    Refill:  3    Delman Cheadle, MD, MPH [facility] at [facility] [place], [place]  [phone] Office phone  [phone] Office fax   02/08/18 5:28 PM

## 2017-12-28 LAB — BASIC METABOLIC PANEL
BUN/Creatinine Ratio: 13 (ref 12–28)
BUN: 17 mg/dL (ref 8–27)
CHLORIDE: 99 mmol/L (ref 96–106)
CO2: 24 mmol/L (ref 20–29)
Calcium: 9.6 mg/dL (ref 8.7–10.3)
Creatinine, Ser: 1.34 mg/dL — ABNORMAL HIGH (ref 0.57–1.00)
GFR, EST AFRICAN AMERICAN: 50 mL/min/{1.73_m2} — AB (ref >59)
GFR, EST NON AFRICAN AMERICAN: 43 mL/min/{1.73_m2} — AB (ref >59)
Glucose: 177 mg/dL — ABNORMAL HIGH (ref 65–99)
POTASSIUM: 5.8 mmol/L — AB (ref 3.5–5.2)
SODIUM: 138 mmol/L (ref 134–144)

## 2018-01-06 ENCOUNTER — Other Ambulatory Visit: Payer: Self-pay | Admitting: Family Medicine

## 2018-01-08 NOTE — Telephone Encounter (Signed)
Glimepiride refill Last Refill:01/05/17 # 180 1 refill Last OV: 12/27/17 PCP: Clelia Croft Pharmacy:[facility] [address] [employer]

## 2018-01-11 ENCOUNTER — Telehealth: Payer: Self-pay | Admitting: Family Medicine

## 2018-01-11 NOTE — Telephone Encounter (Signed)
Called and left VM with pt advising them of Dr. Clelia Croft being on leave. I asked pt to call the office at her convenience and either make an appt with Dr. Clelia Croft in November or make an appt with a different provider if she needs medication refills before she could she Dr. Clelia Croft.   When pt calls back, please make an OV for: 3 MONTH RECHECK ON HER MEDICATION REFILLS AND TO CHECK HER KIDNEYS  Thank you!

## 2018-02-01 ENCOUNTER — Ambulatory Visit: Payer: PPO | Admitting: Family Medicine

## 2018-02-05 ENCOUNTER — Other Ambulatory Visit (HOSPITAL_COMMUNITY): Payer: Self-pay | Admitting: Cardiology

## 2018-02-06 ENCOUNTER — Other Ambulatory Visit: Payer: Self-pay | Admitting: Family Medicine

## 2018-02-06 NOTE — Telephone Encounter (Signed)
Copied from CRM [id number]. Topic: Quick Communication - Rx Refill/Question >> Feb 06, 2018  2:31 PM Arlyss Gandy, NT wrote: Medication: HYDROcodone-acetaminophen (NORCO) 7.5-325 MG tablet, cyclobenzaprine (FLEXERIL) 10 MG tablet, traMADol (ULTRAM) 50 MG tablet   Has the patient contacted their pharmacy? Yes.   (Agent: If no, request that the patient contact the pharmacy for the refill.) (Agent: If yes, when and what did the pharmacy advise?)  Preferred Pharmacy (with phone number or street name): [facility] [address] [place] - [address] [phone] (Phone) [phone] (Fax)    Agent: Please be advised that RX refills may take up to 3 business days. We ask that you follow-up with your pharmacy.

## 2018-02-06 NOTE — Telephone Encounter (Signed)
Requested medication (s) are due for refill today: Unknown  Requested medication (s) are on the active medication list: yes  Last refill:  11/17/17  Future visit scheduled: yes  Notes to clinic:  Controlled medication.  Requested Prescriptions  Pending Prescriptions Disp Refills   diazepam (VALIUM) 5 MG tablet [Pharmacy Med Name: DIAZEPAM 5MG         TAB] 180 tablet 0    Sig: TAKE 1 TABLET BY MOUTH AT BEDTIME AS NEEDED AND  MAY REPEAT DOSE 1 TIME IF NEEDED FOR MUSCLE SPASMS OR SEDATION. NEED VISIT.     Not Delegated - Psychiatry:  Anxiolytics/Hypnotics Failed - 02/06/2018  4:45 PM      Failed - This refill cannot be delegated      Failed - Urine Drug Screen completed in last 360 days.      Passed - Valid encounter within last 6 months    Recent Outpatient Visits          1 month ago Primary osteoarthritis of left knee   [facility], Levell July, MD   3 months ago Encounter for screening mammogram for breast cancer   [facility], Levell July, MD   7 months ago Medication monitoring encounter   [facility] at [facility], Levell July, MD   8 months ago Type 2 diabetes mellitus with stage 3 chronic kidney disease, without long-term current use of insulin [facility])   [facility], Levell July, MD   1 year ago Type 2 diabetes mellitus without complication, without long-term current use of insulin [facility])   [facility], Levell July, MD      Future Appointments            In 1 month Sherren Mocha, MD [facility] at [place], [facility]

## 2018-02-06 NOTE — Telephone Encounter (Signed)
Called pt and informed pt she will need to call and schedule an appointment to refill medication.

## 2018-02-06 NOTE — Telephone Encounter (Signed)
Requested medication (s) are due for refill today: yes  Requested medication (s) are on the active medication list: yes  Last refill:  Tramadol: 10/31/17                   Diazepam: 11/17/17        Norco: 11/17/17   Future visit scheduled: no  Notes to clinic:  Controlled substances   Requested Prescriptions  Pending Prescriptions Disp Refills   traMADol (ULTRAM) 50 MG tablet 240 tablet 2    Sig: Take 2 tablets (100 mg total) by mouth every 6 (six) hours as needed for moderate pain.     Not Delegated - Analgesics:  Opioid Agonists Failed - 02/06/2018  2:45 PM      Failed - This refill cannot be delegated      Failed - Urine Drug Screen completed in last 360 days.      Passed - Valid encounter within last 6 months    Recent Outpatient Visits          1 month ago Primary osteoarthritis of left knee   [facility], Levell July, MD   3 months ago Encounter for screening mammogram for breast cancer   [facility], Levell July, MD   7 months ago Medication monitoring encounter   [facility] at [facility], Levell July, MD   8 months ago Type 2 diabetes mellitus with stage 3 chronic kidney disease, without long-term current use of insulin [facility])   [facility], Levell July, MD   1 year ago Type 2 diabetes mellitus without complication, without long-term current use of insulin [facility])   [facility], Levell July, MD            diazepam (VALIUM) 5 MG tablet 180 tablet 0     Not Delegated - Psychiatry:  Anxiolytics/Hypnotics Failed - 02/06/2018  2:45 PM      Failed - This refill cannot be delegated      Failed - Urine Drug Screen completed in last 360 days.      Passed - Valid encounter within last 6 months    Recent Outpatient Visits          1 month ago Primary osteoarthritis of left knee   [facility], Levell July, MD   3 months ago Encounter for screening mammogram for breast cancer   [facility], Levell July, MD   7 months  ago Medication monitoring encounter   Primary Care at [facility], Levell July, MD   8 months ago Type 2 diabetes mellitus with stage 3 chronic kidney disease, without long-term current use of insulin [facility])   [facility], Levell July, MD   1 year ago Type 2 diabetes mellitus without complication, without long-term current use of insulin [facility])   [facility], Levell July, MD            HYDROcodone-acetaminophen (NORCO) 7.5-325 MG tablet 60 tablet 0    Sig: Take 2 tablets by mouth every 4 (four) hours as needed for moderate pain.     Not Delegated - Analgesics:  Opioid Agonist Combinations Failed - 02/06/2018  2:45 PM      Failed - This refill cannot be delegated      Failed - Urine Drug Screen completed in last 360 days.      Passed - Valid encounter within last  6 months    Recent Outpatient Visits          1 month ago Primary osteoarthritis of left knee   [facility], Levell July, MD   3 months ago Encounter for screening mammogram for breast cancer   [facility], Levell July, MD   7 months ago Medication monitoring encounter   [facility] at [facility], Levell July, MD   8 months ago Type 2 diabetes mellitus with stage 3 chronic kidney disease, without long-term current use of insulin [facility])   [facility], Levell July, MD   1 year ago Type 2 diabetes mellitus without complication, without long-term current use of insulin [facility])   [facility], Levell July, MD             Requested Prescriptions  Pending Prescriptions Disp Refills   traMADol (ULTRAM) 50 MG tablet 240 tablet 2    Sig: Take 2 tablets (100 mg total) by mouth every 6 (six) hours as needed for moderate pain.     Not Delegated - Analgesics:  Opioid Agonists Failed - 02/06/2018  2:45 PM      Failed - This refill cannot be delegated      Failed - Urine Drug Screen completed in last 360 days.      Passed - Valid encounter within last 6 months    Recent Outpatient  Visits          1 month ago Primary osteoarthritis of left knee   [facility], Levell July, MD   3 months ago Encounter for screening mammogram for breast cancer   [facility], Levell July, MD   7 months ago Medication monitoring encounter   Primary Care at [facility], Levell July, MD   8 months ago Type 2 diabetes mellitus with stage 3 chronic kidney disease, without long-term current use of insulin [facility])   [facility], Levell July, MD   1 year ago Type 2 diabetes mellitus without complication, without long-term current use of insulin [facility])   [facility], Levell July, MD            diazepam (VALIUM) 5 MG tablet 180 tablet 0     Not Delegated - Psychiatry:  Anxiolytics/Hypnotics Failed - 02/06/2018  2:45 PM      Failed - This refill cannot be delegated      Failed - Urine Drug Screen completed in last 360 days.      Passed - Valid encounter within last 6 months    Recent Outpatient Visits          1 month ago Primary osteoarthritis of left knee   [facility], Levell July, MD   3 months ago Encounter for screening mammogram for breast cancer   [facility], Levell July, MD   7 months ago Medication monitoring encounter   Primary Care at [facility], Levell July, MD   8 months ago Type 2 diabetes mellitus with stage 3 chronic kidney disease, without long-term current use of insulin [facility])   [facility], Levell July, MD   1 year ago Type 2 diabetes mellitus without complication, without long-term current use of insulin [facility])   [facility], Levell July, MD            HYDROcodone-acetaminophen (NORCO) 7.5-325 MG tablet  60 tablet 0    Sig: Take 2 tablets by mouth every 4 (four) hours as needed for moderate pain.     Not Delegated - Analgesics:  Opioid Agonist Combinations Failed - 02/06/2018  2:45 PM      Failed - This refill cannot be delegated      Failed - Urine Drug Screen completed in last 360 days.       Passed - Valid encounter within last 6 months    Recent Outpatient Visits          1 month ago Primary osteoarthritis of left knee   [facility], Levell July, MD   3 months ago Encounter for screening mammogram for breast cancer   [facility], Levell July, MD   7 months ago Medication monitoring encounter   Primary Care at [facility], Levell July, MD   8 months ago Type 2 diabetes mellitus with stage 3 chronic kidney disease, without long-term current use of insulin [facility])   [facility], Levell July, MD   1 year ago Type 2 diabetes mellitus without complication, without long-term current use of insulin [facility])   [facility], Levell July, MD

## 2018-02-06 NOTE — Telephone Encounter (Signed)
States she doesn't need the Flexeril instead she needs diazepam (VALIUM) 5 MG tablet.

## 2018-02-08 ENCOUNTER — Encounter: Payer: Self-pay | Admitting: Family Medicine

## 2018-02-08 MED ORDER — HYDROCODONE-ACETAMINOPHEN 7.5-325 MG PO TABS: 1.0000 | ORAL_TABLET | Freq: Three times a day (TID) | ORAL | 0 refills | Status: DC | PRN

## 2018-02-08 MED ORDER — DIAZEPAM 5 MG PO TABS: ORAL_TABLET | ORAL | 0 refills | Status: DC

## 2018-02-08 NOTE — Telephone Encounter (Signed)
Patient has an appointment scheduled with me for 03/10/2018.  Okay to provide 1 month of refills of these chronic long-standing medications to get her to her appointment.  Today I have utilized the [place] Controlled Substance Registry's online query to confirm compliance regarding the patient's controlled medications. My review reveals appropriate prescription fills and that I am the sole provider of these medications. Rechecks will occur regularly and the patient is aware of our use of the system.  Patient last filled hydrocodone 7.5-325 #90 that I had written on 7/2 on 7/2, 7/31, and 9/3.  She filled an additional hydrocodone 7.5 #60 from me written 8/28 on that same day.  [facility] under [facility] Medicare other than the additional 8/28 hydrocodone.  Patient filled tramadol 50 number 240/month written by myself on 7/2 was filled on 8/4 and 9/3.  The prior month filled 7/7 was a prescription left over from 5/11 so she actually has 1 additional refill left on the 7/2 tramadol prescription.  Patient last filled diazepam 5 mg #180 on 7/19 so okay to go ahead and refill today.  Patient informed of diazepam and hydrocodone refills by my chart as well as tramadol did not help because database confirms that she still has 1 additional tramadol refill available on her current prescription at [facility] pharmacy

## 2018-02-09 NOTE — Telephone Encounter (Signed)
[facility] called, spoke to Electra, Pensions consultant who verified receipt of Valium on 02/08/18.

## 2018-02-20 ENCOUNTER — Other Ambulatory Visit (HOSPITAL_COMMUNITY): Payer: Self-pay | Admitting: Internal Medicine

## 2018-03-10 ENCOUNTER — Encounter: Payer: Self-pay | Admitting: Family Medicine

## 2018-03-10 ENCOUNTER — Ambulatory Visit (INDEPENDENT_AMBULATORY_CARE_PROVIDER_SITE_OTHER): Payer: PPO | Admitting: Family Medicine

## 2018-03-10 VITALS — BP 111/70 | HR 70 | Temp 97.9°F | Ht 65.75 in | Wt 246.2 lb

## 2018-03-10 DIAGNOSIS — Z23 Encounter for immunization: Secondary | ICD-10-CM | POA: Diagnosis not present

## 2018-03-10 DIAGNOSIS — Z79899 Other long term (current) drug therapy: Secondary | ICD-10-CM

## 2018-03-10 DIAGNOSIS — F17218 Nicotine dependence, cigarettes, with other nicotine-induced disorders: Secondary | ICD-10-CM | POA: Diagnosis not present

## 2018-03-10 DIAGNOSIS — E119 Type 2 diabetes mellitus without complications: Secondary | ICD-10-CM | POA: Diagnosis not present

## 2018-03-10 DIAGNOSIS — J439 Emphysema, unspecified: Secondary | ICD-10-CM

## 2018-03-10 DIAGNOSIS — G894 Chronic pain syndrome: Secondary | ICD-10-CM | POA: Diagnosis not present

## 2018-03-10 MED ORDER — BUDESONIDE-FORMOTEROL FUMARATE 160-4.5 MCG/ACT IN AERO: 1.0000 | INHALATION_SPRAY | Freq: Two times a day (BID) | RESPIRATORY_TRACT | 4 refills | Status: DC

## 2018-03-10 MED ORDER — CYCLOBENZAPRINE HCL 10 MG PO TABS: 10.0000 mg | ORAL_TABLET | Freq: Three times a day (TID) | ORAL | 1 refills | Status: DC | PRN

## 2018-03-10 MED ORDER — TRAMADOL HCL 50 MG PO TABS: 100.0000 mg | ORAL_TABLET | Freq: Four times a day (QID) | ORAL | 2 refills | Status: DC | PRN

## 2018-03-10 MED ORDER — TIOTROPIUM BROMIDE MONOHYDRATE 18 MCG IN CAPS: 18.0000 ug | ORAL_CAPSULE | Freq: Every day | RESPIRATORY_TRACT | 1 refills | Status: DC

## 2018-03-10 MED ORDER — INSULIN GLARGINE 100 UNIT/ML SOLOSTAR PEN: 15.0000 [IU] | PEN_INJECTOR | Freq: Every day | SUBCUTANEOUS | 1 refills | Status: DC

## 2018-03-10 MED ORDER — IPRATROPIUM-ALBUTEROL 20-100 MCG/ACT IN AERS: 1.0000 | INHALATION_SPRAY | Freq: Four times a day (QID) | RESPIRATORY_TRACT | 0 refills | Status: DC

## 2018-03-10 MED ORDER — HYDROCODONE-ACETAMINOPHEN 7.5-325 MG PO TABS: 1.0000 | ORAL_TABLET | Freq: Three times a day (TID) | ORAL | 0 refills | Status: DC | PRN

## 2018-03-10 MED ORDER — GABAPENTIN 400 MG PO CAPS: 300.0000 mg | ORAL_CAPSULE | Freq: Three times a day (TID) | ORAL | 1 refills | Status: DC

## 2018-03-10 MED ORDER — IPRATROPIUM BROMIDE HFA 17 MCG/ACT IN AERS: 2.0000 | INHALATION_SPRAY | RESPIRATORY_TRACT | 0 refills | Status: DC | PRN

## 2018-03-10 MED ORDER — HALOBETASOL PROPIONATE 0.05 % EX CREA: TOPICAL_CREAM | Freq: Two times a day (BID) | CUTANEOUS | 0 refills | Status: DC

## 2018-03-10 MED ORDER — DIAZEPAM 5 MG PO TABS: ORAL_TABLET | ORAL | 0 refills | Status: DC

## 2018-03-10 NOTE — Progress Notes (Signed)
Subjective:    Patient: Kaitlin Cantrell  DOB: 1956-02-23; 62 y.o.   MRN: [id number]  Chief Complaint  Patient presents with  . Medication Refill    pain meds and chronic condition meds    HPI Not checking cbgs. Denies any sxs of hyperglycemia or hypoglycemia. Does have glucometer in case it is needed. Sometimes has some urinary retention at times  Doesn't like the spiriva delivery system - is having a hard time getting into her lungs - miss inhalation or it hits back of throat and coughs it back up. Symbicort and atrovent worked much better for her but she remembers having trouble getting through it. She is allergic to albuterol but does ok on the long acting ones in the maintanence med but does not remember if she has every tried xopenex.  Still having GERD on zantac twice a day so she is using otc prn nexium. Several times she will wake up choking in her sleep. Refuses GI referral for further evaluation. Does have hiatal hernia with prn IBS.   Using the cyclobenzaprine twice a day but occasionally will take a third one before bed.  Needs to start moving more and symptoms getting worse over the cold weather.  This week exercise was unloading wood and now back hurting her.  Wants to try to go to gym but wants buddy to hold her accountable and hasn't been able to find.  Not being as active shopping in general.   Has had   Medical History Past Medical History:  Diagnosis Date  . Allergy    seasonal  . Arthritis   . Diabetes mellitus   . GERD (gastroesophageal reflux disease)   . History of pneumonia    15 years ago  . Hyperlipidemia   . Hypertension   . PE (pulmonary embolism) 12/2015   Past Surgical History:  Procedure Laterality Date  . ANKLE FRACTURE SURGERY  1974   left and pinned then pins removed  . ANKLE GANGLION CYST EXCISION  1994   right  . BACK SURGERY  1998   discectomy and then fusion  . CARDIAC CATHETERIZATION N/A 04/05/2016   Procedure: Right/Left Heart  Cath and Coronary Angiography;  Surgeon: Larey Dresser, MD;  Location: [facility];  Service: Cardiovascular;  Laterality: N/A;  . KNEE ARTHROPLASTY  02/14/2012   Procedure: COMPUTER ASSISTED TOTAL KNEE ARTHROPLASTY;  Surgeon: Meredith Pel, MD;  Location: [facility];  Service: Orthopedics;  Laterality: Left;  Left total knee arthroplasty  . TONSILLECTOMY  1972  . TUBAL LIGATION  1996  . UMBILICAL HERNIA REPAIR  1996   Current Outpatient Medications on File Prior to Visit  Medication Sig Dispense Refill  . atorvastatin (LIPITOR) 80 MG tablet TAKE 1 TABLET BY MOUTH ONCE DAILY AT 6PM 90 tablet 1  . betamethasone, augmented, (DIPROLENE) 0.05 % lotion Apply topically 2 (two) times daily. After completing up to 2 weeks of the halobetasol.  Then use at first sign of ecurrence 60 mL 4  . budesonide-formoterol (SYMBICORT) 160-4.5 MCG/ACT inhaler Inhale 1 puff into the lungs 2 (two) times daily. 1 Inhaler 12  . carvedilol (COREG) 12.5 MG tablet TAKE 1 TABLET BY MOUTH TWICE DAILY WITH A MEAL 180 tablet 0  . cyclobenzaprine (FLEXERIL) 10 MG tablet TAKE 1 TABLET BY MOUTH THREE TIMES DAILY AS NEEDED FOR MUSCLE SPASMS. 270 tablet 1  . diazepam (VALIUM) 5 MG tablet TAKE 1 TABLET BY MOUTH AT BEDTIME AS NEEDED AND  MAY REPEAT DOSE  ONE TIME IF NEEDED FOR MUSCLE SPASMS OR SEDATION. NEED VISIT 180 tablet 0  . ENTRESTO 49-51 MG TAKE 1 TABLET BY MOUTH TWICE DAILY 60 tablet 6  . furosemide (LASIX) 40 MG tablet Take 1 tab po every Mon, Wed, Fri, and Sat 50 tablet 3  . gabapentin (NEURONTIN) 300 MG capsule TAKE 1 CAPSULE BY MOUTH THREE TIMES DAILY 270 capsule 1  . glimepiride (AMARYL) 4 MG tablet TAKE 1 TABLET BY MOUTH TWICE DAILY 180 tablet 1  . halobetasol (ULTRAVATE) 0.05 % cream Apply topically 2 (two) times daily. For up to 2 weeks 50 g 0  . HYDROcodone-acetaminophen (NORCO) 7.5-325 MG tablet Take 1 tablet by mouth every 8 (eight) hours as needed for moderate pain. 90 tablet 0  . HYDROcodone-acetaminophen  (NORCO) 7.5-325 MG tablet Take 1 tablet by mouth every 8 (eight) hours as needed for moderate pain. 90 tablet 0  . HYDROcodone-acetaminophen (NORCO) 7.5-325 MG tablet Take 1 tablet by mouth every 8 (eight) hours as needed for moderate pain. 90 tablet 0  . Insulin Glargine (LANTUS SOLOSTAR) 100 UNIT/ML Solostar Pen Inject 15 Units into the skin daily at 10 pm. 5 pen 1  . Insulin Pen Needle (PEN NEEDLES) 32G X 4 MM MISC 1 Units by Does not apply route daily. 30 each 1  . Krill Oil CAPS Take 1 capsule by mouth daily.     . metFORMIN (GLUCOPHAGE) 850 MG tablet Take 1 tablet (850 mg total) by mouth 2 (two) times daily with a meal. 180 tablet 1  . mupirocin ointment (BACTROBAN) 2 % Apply 1 application 4 (four) times daily topically. 30 g 1  . ranitidine (ZANTAC) 150 MG tablet Take 1 tablet (150 mg total) by mouth 2 (two) times daily. 180 tablet 3  . Tiotropium Bromide Monohydrate (SPIRIVA RESPIMAT) 2.5 MCG/ACT AERS Inhale 1 spray into the lungs daily. 1 Inhaler 5  . traMADol (ULTRAM) 50 MG tablet Take 2 tablets (100 mg total) by mouth every 6 (six) hours as needed for moderate pain. 240 tablet 2  . XARELTO 20 MG TABS tablet TAKE 1 TABLET BY MOUTH ONCE DAILY 30 tablet 11   No current facility-administered medications on file prior to visit.    Allergies  Allergen Reactions  . Albuterol Anaphylaxis    Throat/lips swelling  . Morphine And Related Other (See Comments)    Very aggressive and doesn't help pain   Family History  Problem Relation Age of Onset  . Colon polyps Father   . Diabetes Father        mother  . Heart disease Father   . Heart murmur Mother   . Diabetes Mother   . Hypertension Mother   . Diabetes Brother   . Cancer Maternal Grandmother   . Diabetes Paternal Grandmother   . Heart disease Paternal Grandmother   . COPD Paternal Grandfather   . Heart disease Paternal Grandfather   . Hypertension Brother   . Hypertension Brother    Social History   Socioeconomic History    . Marital status: Married    Spouse name: Not on file  . Number of children: 0  . Years of education: Not on file  . Highest education level: Not on file  Occupational History  . Occupation: retired  Scientific laboratory technician  . Financial resource strain: Not on file  . Food insecurity:    Worry: Not on file    Inability: Not on file  . Transportation needs:    Medical: Not on file  Non-medical: Not on file  Tobacco Use  . Smoking status: Current Every Day Smoker    Packs/day: 1.00    Years: 40.00    Pack years: 40.00    Types: Cigarettes  . Smokeless tobacco: Never Used  Substance and Sexual Activity  . Alcohol use: Yes    Comment: rarely  . Drug use: No  . Sexual activity: Not on file  Lifestyle  . Physical activity:    Days per week: Not on file    Minutes per session: Not on file  . Stress: Not on file  Relationships  . Social connections:    Talks on phone: Not on file    Gets together: Not on file    Attends religious service: Not on file    Active member of club or organization: Not on file    Attends meetings of clubs or organizations: Not on file    Relationship status: Not on file  Other Topics Concern  . Not on file  Social History Narrative   Exercise walking daily (varies)   Depression screen [facility] 2/9 12/27/2017 10/31/2017 06/29/2017 01/05/2017 12/28/2016  Decreased Interest 1 0 0 0 0  Down, Depressed, Hopeless 1 0 0 0 1  PHQ - 2 Score 2 0 0 0 1  Altered sleeping 0 - - - -  Tired, decreased energy 0 - - - -  Change in appetite 0 - - - -  Feeling bad or failure about yourself  1 - - - -  Trouble concentrating 0 - - - -  Moving slowly or fidgety/restless 0 - - - -  Suicidal thoughts 0 - - - -  PHQ-9 Score 3 - - - -    ROS As noted in HPI  Objective:  BP 111/70   Pulse 70   Temp 97.9 F (36.6 C) (Oral)   Ht 5' 5.75" (1.67 m)   Wt 246 lb 3.2 oz (111.7 kg)   SpO2 95%   BMI 40.04 kg/m  Physical Exam  Constitutional: She is oriented to person, place, and  time. She appears well-developed and well-nourished. No distress.  HENT:  Head: Normocephalic and atraumatic.  Right Ear: External ear normal.  Left Ear: External ear normal.  Eyes: Conjunctivae are normal. No scleral icterus.  Neck: Normal range of motion. Neck supple. No thyromegaly present.  Cardiovascular: Normal rate, regular rhythm, normal heart sounds and intact distal pulses.  Pulmonary/Chest: Effort normal. No respiratory distress. She has wheezes.  Musculoskeletal: She exhibits no edema.  Lymphadenopathy:    She has no cervical adenopathy.  Neurological: She is alert and oriented to person, place, and time.  Skin: Skin is warm and dry. She is not diaphoretic. No erythema.  Psychiatric: She has a normal mood and affect. Her behavior is normal.   Anterior wheeze  POC TESTING Office Visit on 03/10/2018  Component Date Value Ref Range Status  . WBC 03/10/2018 8.1  3.4 - 10.8 x10E3/uL Final  . RBC 03/10/2018 3.97  3.77 - 5.28 x10E6/uL Final  . Hemoglobin 03/10/2018 10.5* 11.1 - 15.9 g/dL Final  . Hematocrit 03/10/2018 33.5* 34.0 - 46.6 % Final  . MCV 03/10/2018 84  79 - 97 fL Final  . MCH 03/10/2018 26.4* 26.6 - 33.0 pg Final  . MCHC 03/10/2018 31.3* 31.5 - 35.7 g/dL Final  . RDW 03/10/2018 13.4  12.3 - 15.4 % Final  . Platelets 03/10/2018 334  150 - 450 x10E3/uL Final  . Neutrophils 03/10/2018 59  Not Estab. %  Final  . Lymphs 03/10/2018 27  Not Estab. % Final  . Monocytes 03/10/2018 8  Not Estab. % Final  . Eos 03/10/2018 5  Not Estab. % Final  . Basos 03/10/2018 1  Not Estab. % Final  . Neutrophils Absolute 03/10/2018 4.8  1.4 - 7.0 x10E3/uL Final  . Lymphocytes Absolute 03/10/2018 2.1  0.7 - 3.1 x10E3/uL Final  . Monocytes Absolute 03/10/2018 0.7  0.1 - 0.9 x10E3/uL Final  . EOS (ABSOLUTE) 03/10/2018 0.4  0.0 - 0.4 x10E3/uL Final  . Basophils Absolute 03/10/2018 0.1  0.0 - 0.2 x10E3/uL Final  . Immature Granulocytes 03/10/2018 0  Not Estab. % Final  . Immature Grans  (Abs) 03/10/2018 0.0  0.0 - 0.1 x10E3/uL Final  . Glucose 03/10/2018 155* 65 - 99 mg/dL Final  . BUN 03/10/2018 24  8 - 27 mg/dL Final  . Creatinine, Ser 03/10/2018 1.53* 0.57 - 1.00 mg/dL Final  . GFR calc non Af Amer 03/10/2018 37* >59 mL/min/1.73 Final  . GFR calc Af Amer 03/10/2018 42* >59 mL/min/1.73 Final  . BUN/Creatinine Ratio 03/10/2018 16  12 - 28 Final  . Sodium 03/10/2018 142  134 - 144 mmol/L Final  . Potassium 03/10/2018 5.2  3.5 - 5.2 mmol/L Final  . Chloride 03/10/2018 101  96 - 106 mmol/L Final  . CO2 03/10/2018 25  20 - 29 mmol/L Final  . Calcium 03/10/2018 8.8  8.7 - 10.3 mg/dL Final  . Total Protein 03/10/2018 6.7  6.0 - 8.5 g/dL Final  . Albumin 03/10/2018 3.7  3.6 - 4.8 g/dL Final  . Globulin, Total 03/10/2018 3.0  1.5 - 4.5 g/dL Final  . Albumin/Globulin Ratio 03/10/2018 1.2  1.2 - 2.2 Final  . Bilirubin Total 03/10/2018 <0.2  0.0 - 1.2 mg/dL Final  . Alkaline Phosphatase 03/10/2018 118* 39 - 117 IU/L Final  . AST 03/10/2018 12  0 - 40 IU/L Final  . ALT 03/10/2018 5  0 - 32 IU/L Final  . Hgb A1c MFr Bld 03/10/2018 6.5* 4.8 - 5.6 % Final   Comment:          Prediabetes: 5.7 - 6.4          Diabetes: >6.4          Glycemic control for adults with diabetes: <7.0   . Est. average glucose Bld gHb Est-m* 03/10/2018 140  mg/dL Final  . TSH 03/10/2018 4.390  0.450 - 4.500 uIU/mL Final  . Vitamin B-12 03/10/2018 229* 232 - 1,245 pg/mL Final     Assessment & Plan:   1. Type 2 diabetes mellitus without complication, without long-term current use of insulin ([place])   2. Need for prophylactic vaccination and inoculation against influenza   3. Encounter for long-term current use of high risk medication   4. Chronic pain syndrome   5. Cigarette nicotine dependence with other nicotine-induced disorder   6. Pulmonary emphysema, unspecified emphysema type ([place])    Refill symbicort, cyclobenzoprine, valium but future date to 05/09/2018, gabapentin, hydrocodone - due today,  tramadol Refill metformin when labs come back Ok to refill lipitor, coreg, lantus when requested  Patient will continue on current chronic medications other than changes noted above, so ok to refill when needed.  Today I have utilized the [place] Controlled Substance Registry's online query to confirm compliance regarding the patient's controlled medications. My review reveals appropriate prescription fills and that I am the sole provider of these medications. Rechecks will occur regularly and the patient is aware of our  use of the system. Diazepam and hydrocodone last filled 10/10, tramadol last filled 10/9   See after visit summary for patient specific instructions.  Orders Placed This Encounter  Procedures  . Flu Vaccine QUAD 36+ mos IM  . CBC with Differential/Platelet  . CMP14+EGFR  . Hemoglobin A1c  . TSH  . Vitamin B12    Meds ordered this encounter  Medications  . budesonide-formoterol (SYMBICORT) 160-4.5 MCG/ACT inhaler    Sig: Inhale 1 puff into the lungs 2 (two) times daily.    Dispense:  3 Inhaler    Refill:  4  . Ipratropium-Albuterol (COMBIVENT) 20-100 MCG/ACT AERS respimat    Sig: Inhale 1 puff into the lungs every 6 (six) hours.    Dispense:  3 Inhaler    Refill:  0  . tiotropium (SPIRIVA HANDIHALER) 18 MCG inhalation capsule    Sig: Place 1 capsule (18 mcg total) into inhaler and inhale daily.    Dispense:  90 capsule    Refill:  1  . HYDROcodone-acetaminophen (NORCO) 7.5-325 MG tablet    Sig: Take 1 tablet by mouth every 8 (eight) hours as needed for moderate pain.    Dispense:  90 tablet    Refill:  0  . HYDROcodone-acetaminophen (NORCO) 7.5-325 MG tablet    Sig: Take 1 tablet by mouth every 8 (eight) hours as needed for moderate pain.    Dispense:  90 tablet    Refill:  0  . HYDROcodone-acetaminophen (NORCO) 7.5-325 MG tablet    Sig: Take 1 tablet by mouth every 8 (eight) hours as needed for moderate pain.    Dispense:  90 tablet    Refill:  0    For  chronic pain syndrome  . traMADol (ULTRAM) 50 MG tablet    Sig: Take 2 tablets (100 mg total) by mouth every 6 (six) hours as needed for moderate pain.    Dispense:  240 tablet    Refill:  2  . cyclobenzaprine (FLEXERIL) 10 MG tablet    Sig: Take 1 tablet (10 mg total) by mouth 3 (three) times daily as needed. for muscle spams    Dispense:  270 tablet    Refill:  1  . diazepam (VALIUM) 5 MG tablet    Sig: TAKE 1 TABLET BY MOUTH AT BEDTIME AS NEEDED AND  MAY REPEAT DOSE ONE TIME IF NEEDED FOR MUSCLE SPASMS OR SEDATION. NEED VISIT    Dispense:  180 tablet    Refill:  0  . halobetasol (ULTRAVATE) 0.05 % cream    Sig: Apply topically 2 (two) times daily. For up to 2 weeks    Dispense:  50 g    Refill:  0  . gabapentin (NEURONTIN) 400 MG capsule    Sig: Take 1 capsule (400 mg total) by mouth 3 (three) times daily.    Dispense:  270 capsule    Refill:  1  . ipratropium (ATROVENT HFA) 17 MCG/ACT inhaler    Sig: Inhale 2 puffs into the lungs every 4 (four) hours as needed for wheezing.    Dispense:  3 Inhaler    Refill:  0    D/c prior rx for ipratropium-albuterol combo inhaler - pt with anaphylactic reaction to albuterol (but no prob tolerating symbicort).  . Insulin Glargine (LANTUS SOLOSTAR) 100 UNIT/ML Solostar Pen    Sig: Inject 15 Units into the skin daily at 10 pm.    Dispense:  5 pen    Refill:  1   Over 40 min  spent in face-to-face evaluation of and consultation with patient and coordination of care.  Over 50% of this time was spent counseling this patient regarding appropriate use of inhalers for copd control and options considering insurance and allergies, reviewing appropriate technique and use of dpi.  Patient verbalized to me that they understand the following: diagnosis, what is being done for them, what to expect and what should be done at home.  Their questions have been answered. They understand that I am unable to predict every possible medication interaction or adverse  outcome and that if any unexpected symptoms arise, they should contact us and their pharmacist, as well as never hesitate to seek urgent/emergent care at [facility] or ER if they think it might be warranted.    Delman Cheadle, MD, MPH [facility] [address] [place], [place]  [address] [phone] Office phone  [phone] Office fax  03/10/18 8:27 AM

## 2018-03-10 NOTE — Patient Instructions (Addendum)
   If you have lab work done today you will be contacted with your lab results within the next 2 weeks.  If you have not heard from us then please contact us. The fastest way to get your results is to register for My Chart.   IF you received an x-ray today, you will receive an invoice from [facility]. Please contact [facility] at [phone] with questions or concerns regarding your invoice.   IF you received labwork today, you will receive an invoice from [facility]. Please contact [facility] at [phone] with questions or concerns regarding your invoice.   Our billing staff will not be able to assist you with questions regarding bills from these companies.  You will be contacted with the lab results as soon as they are available. The fastest way to get your results is to activate your My Chart account. Instructions are located on the last page of this paperwork. If you have not heard from us regarding the results in 2 weeks, please contact this office.     Chronic Obstructive Pulmonary Disease Chronic obstructive pulmonary disease (COPD) is a long-term (chronic) lung problem. When you have COPD, it is hard for air to get in and out of your lungs. The way your lungs work will never return to normal. Usually the condition gets worse over time. There are things you can do to keep yourself as healthy as possible. Your doctor may treat your condition with:  Medicines.  Quitting smoking, if you smoke.  Rehabilitation. This may involve a team of specialists.  Oxygen.  Exercise and changes to your diet.  Lung surgery.  Comfort measures (palliative care).  Follow these instructions at home: Medicines  Take over-the-counter and prescription medicines only as told by your doctor.  Talk to your doctor before taking any cough or allergy medicines. You may need to avoid medicines that cause your lungs to be dry. Lifestyle  If you smoke, stop. Smoking makes the  problem worse. If you need help quitting, ask your doctor.  Avoid being around things that make your breathing worse. This may include smoke, chemicals, and fumes.  Stay active, but remember to also rest.  Learn and use tips on how to relax.  Make sure you get enough sleep. Most adults need at least 7 hours a night.  Eat healthy foods. Eat smaller meals more often. Rest before meals. Controlled breathing  Learn and use tips on how to control your breathing as told by your doctor. Try: ? Breathing in (inhaling) through your nose for 1 second. Then, pucker your lips and breath out (exhale) through your lips for 2 seconds. ? Putting one hand on your belly (abdomen). Breathe in slowly through your nose for 1 second. Your hand on your belly should move out. Pucker your lips and breathe out slowly through your lips. Your hand on your belly should move in as you breathe out. Controlled coughing  Learn and use controlled coughing to clear mucus from your lungs. The steps are: 1. Lean your head a little forward. 2. Breathe in deeply. 3. Try to hold your breath for 3 seconds. 4. Keep your mouth slightly open while coughing 2 times. 5. Spit any mucus out into a tissue. 6. Rest and do the steps again 1 or 2 times as needed. General instructions  Make sure you get all the shots (vaccines) that your doctor recommends. Ask your doctor about a flu shot and a pneumonia shot.  Use oxygen therapy and   therapy to help improve your lungs (pulmonary rehabilitation) if told by your doctor. If you need home oxygen therapy, ask your doctor if you should buy a tool to measure your oxygen level (oximeter).  Make a COPD action plan with your doctor. This helps you know what to do if you feel worse than usual.  Manage any other conditions you have as told by your doctor.  Avoid going outside when it is very hot, cold, or humid.  Avoid people who have a sickness you can catch (contagious).  Keep all  follow-up visits as told by your doctor. This is important. Contact a doctor if:  You cough up more mucus than usual.  There is a change in the color or thickness of the mucus.  It is harder to breathe than usual.  Your breathing is faster than usual.  You have trouble sleeping.  You need to use your medicines more often than usual.  You have trouble doing your normal activities such as getting dressed or walking around the house. Get help right away if:  You have shortness of breath while resting.  You have shortness of breath that stops you from: ? Being able to talk. ? Doing normal activities.  Your chest hurts for longer than 5 minutes.  Your skin color is more blue than usual.  Your pulse oximeter shows that you have low oxygen for longer than 5 minutes.  You have a fever.  You feel too tired to breathe normally. Summary  Chronic obstructive pulmonary disease (COPD) is a long-term lung problem.  The way your lungs work will never return to normal. Usually the condition gets worse over time. There are things you can do to keep yourself as healthy as possible.  Take over-the-counter and prescription medicines only as told by your doctor.  If you smoke, stop. Smoking makes the problem worse. This information is not intended to replace advice given to you by your health care provider. Make sure you discuss any questions you have with your health care provider. Document Released: 10/05/2007 Document Revised: 09/24/2015 Document Reviewed: 12/13/2012 Elsevier Interactive Patient Education  2017 Elsevier Inc.  

## 2018-03-11 LAB — CBC WITH DIFFERENTIAL/PLATELET
BASOS ABS: 0.1 10*3/uL (ref 0.0–0.2)
Basos: 1 %
EOS (ABSOLUTE): 0.4 10*3/uL (ref 0.0–0.4)
Eos: 5 %
Hematocrit: 33.5 % — ABNORMAL LOW (ref 34.0–46.6)
Hemoglobin: 10.5 g/dL — ABNORMAL LOW (ref 11.1–15.9)
IMMATURE GRANS (ABS): 0 10*3/uL (ref 0.0–0.1)
Immature Granulocytes: 0 %
LYMPHS: 27 %
Lymphocytes Absolute: 2.1 10*3/uL (ref 0.7–3.1)
MCH: 26.4 pg — AB (ref 26.6–33.0)
MCHC: 31.3 g/dL — ABNORMAL LOW (ref 31.5–35.7)
MCV: 84 fL (ref 79–97)
Monocytes Absolute: 0.7 10*3/uL (ref 0.1–0.9)
Monocytes: 8 %
Neutrophils Absolute: 4.8 10*3/uL (ref 1.4–7.0)
Neutrophils: 59 %
PLATELETS: 334 10*3/uL (ref 150–450)
RBC: 3.97 x10E6/uL (ref 3.77–5.28)
RDW: 13.4 % (ref 12.3–15.4)
WBC: 8.1 10*3/uL (ref 3.4–10.8)

## 2018-03-11 LAB — CMP14+EGFR
ALT: 5 IU/L (ref 0–32)
AST: 12 IU/L (ref 0–40)
Albumin/Globulin Ratio: 1.2 (ref 1.2–2.2)
Albumin: 3.7 g/dL (ref 3.6–4.8)
Alkaline Phosphatase: 118 IU/L — ABNORMAL HIGH (ref 39–117)
BUN/Creatinine Ratio: 16 (ref 12–28)
BUN: 24 mg/dL (ref 8–27)
CHLORIDE: 101 mmol/L (ref 96–106)
CO2: 25 mmol/L (ref 20–29)
Calcium: 8.8 mg/dL (ref 8.7–10.3)
Creatinine, Ser: 1.53 mg/dL — ABNORMAL HIGH (ref 0.57–1.00)
GFR calc non Af Amer: 37 mL/min/{1.73_m2} — ABNORMAL LOW (ref >59)
GFR, EST AFRICAN AMERICAN: 42 mL/min/{1.73_m2} — AB (ref >59)
GLUCOSE: 155 mg/dL — AB (ref 65–99)
Globulin, Total: 3 g/dL (ref 1.5–4.5)
Potassium: 5.2 mmol/L (ref 3.5–5.2)
Sodium: 142 mmol/L (ref 134–144)
TOTAL PROTEIN: 6.7 g/dL (ref 6.0–8.5)

## 2018-03-11 LAB — TSH: TSH: 4.39 u[IU]/mL (ref 0.450–4.500)

## 2018-03-11 LAB — VITAMIN B12: VITAMIN B 12: 229 pg/mL — AB (ref 232–1245)

## 2018-03-11 LAB — HEMOGLOBIN A1C
ESTIMATED AVERAGE GLUCOSE: 140 mg/dL
HEMOGLOBIN A1C: 6.5 % — AB (ref 4.8–5.6)

## 2018-04-21 ENCOUNTER — Other Ambulatory Visit: Payer: Self-pay | Admitting: Family Medicine

## 2018-05-05 ENCOUNTER — Other Ambulatory Visit (HOSPITAL_COMMUNITY): Payer: Self-pay | Admitting: Cardiology

## 2018-05-12 ENCOUNTER — Other Ambulatory Visit (HOSPITAL_COMMUNITY): Payer: Self-pay | Admitting: Cardiology

## 2018-06-02 ENCOUNTER — Other Ambulatory Visit: Payer: Self-pay | Admitting: Family Medicine

## 2018-06-04 NOTE — Telephone Encounter (Signed)
Requested Prescriptions  Pending Prescriptions Disp Refills  . atorvastatin (LIPITOR) 80 MG tablet [Pharmacy Med Name: Atorvastatin Calcium 80 MG Oral Tablet] 90 tablet 0    Sig: TAKE 1 TABLET BY MOUTH ONCE DAILY 6 PM     Cardiovascular:  Antilipid - Statins Failed - 06/02/2018  5:30 AM      Failed - HDL in normal range and within 360 days    HDL  Date Value Ref Range Status  10/31/2017 35 (L) >39 mg/dL Final         Passed - Total Cholesterol in normal range and within 360 days    Cholesterol, Total  Date Value Ref Range Status  10/31/2017 141 100 - 199 mg/dL Final         Passed - LDL in normal range and within 360 days    LDL Calculated  Date Value Ref Range Status  10/31/2017 83 0 - 99 mg/dL Final         Passed - Triglycerides in normal range and within 360 days    Triglycerides  Date Value Ref Range Status  10/31/2017 117 0 - 149 mg/dL Final         Passed - Patient is not pregnant      Passed - Valid encounter within last 12 months    Recent Outpatient Visits          2 months ago Type 2 diabetes mellitus without complication, without long-term current use of insulin ([facility])   [facility], Levell July, MD   5 months ago Primary osteoarthritis of left knee   [facility], Levell July, MD   7 months ago Encounter for screening mammogram for breast cancer   [facility], Levell July, MD   11 months ago Medication monitoring encounter   [facility], Levell July, MD   1 year ago Type 2 diabetes mellitus with stage 3 chronic kidney disease, without long-term current use of insulin [facility])   [facility], Levell July, MD      Future Appointments            In 2 weeks Sherren Mocha, MD [facility] [place], [facility]          Filled until appt

## 2018-06-11 ENCOUNTER — Other Ambulatory Visit: Payer: Self-pay | Admitting: Family Medicine

## 2018-06-11 DIAGNOSIS — G894 Chronic pain syndrome: Secondary | ICD-10-CM

## 2018-06-11 DIAGNOSIS — M1711 Unilateral primary osteoarthritis, right knee: Secondary | ICD-10-CM

## 2018-06-11 DIAGNOSIS — M1990 Unspecified osteoarthritis, unspecified site: Secondary | ICD-10-CM

## 2018-06-11 DIAGNOSIS — M1712 Unilateral primary osteoarthritis, left knee: Secondary | ICD-10-CM

## 2018-06-11 DIAGNOSIS — Z79891 Long term (current) use of opiate analgesic: Secondary | ICD-10-CM

## 2018-06-11 NOTE — Telephone Encounter (Signed)
Last office visit 12/27/17. Last filled 05/07/2018 #90. Follow up visit 06/18/2018.

## 2018-06-12 ENCOUNTER — Telehealth: Payer: Self-pay | Admitting: Family Medicine

## 2018-06-12 ENCOUNTER — Other Ambulatory Visit: Payer: Self-pay | Admitting: Family Medicine

## 2018-06-12 MED ORDER — HYDROCODONE-ACETAMINOPHEN 7.5-325 MG PO TABS: 1.0000 | ORAL_TABLET | Freq: Three times a day (TID) | ORAL | 0 refills | Status: DC | PRN

## 2018-06-12 MED ORDER — TRAMADOL HCL 50 MG PO TABS: 100.0000 mg | ORAL_TABLET | Freq: Four times a day (QID) | ORAL | 1 refills | Status: DC | PRN

## 2018-06-12 NOTE — Telephone Encounter (Unsigned)
Copied from CRM [id number]. Topic: Quick Communication - Rx Refill/Question >> Jun 12, 2018  4:19 PM Arlyss Gandy, NT wrote: Medication: traMADol (ULTRAM) 50 MG tablet   Has the patient contacted their pharmacy? Yes.   (Agent: If no, request that the patient contact the pharmacy for the refill.) (Agent: If yes, when and what did the pharmacy advise?)  Preferred Pharmacy (with phone number or street name): [facility] [address] [place] - [address] [phone] (Phone) [phone] (Fax)    Agent: Please be advised that RX refills may take up to 3 business days. We ask that you follow-up with your pharmacy.

## 2018-06-12 NOTE — Telephone Encounter (Signed)
Pt has appt w/ me scheduled for 4/2.  Please let her know that I placed a referral to pain mangement for her since unfortunately, I will no longer be at Primary Care at [facility] after April 2020 and none of my partners are accepting patients for chronic pain med management so establishing with pain management is the only way to guarantee she can receive uninterrupted care. I am not yet sure where my next employment will be but if she want to find me in the future to re-establish, please feel free to contact [email or web address] for information. ````````````````````````````````` PDMP reviewed - tramadol #240 written 11/9 were filled on 11/9,12/6, and 1/11 Hydrocodone 7.5mg  #90 tabs written 11/9 were filled 11/9, 12/10, 1/11. Sent in refills for today 2/11 and 3/10.

## 2018-06-12 NOTE — Telephone Encounter (Signed)
LVM for pt regarding teir cancellation of the appt scheduled for 2/17 with Dr. Clelia Croft. Due to Dr. Clelia Croft being on leave, pt will need to be rescheduled. Pt can be schedule with a different provider if pt needs to be seen before Dr. Clelia Croft returns or if pt can/wants to wait until Clelia Croft comes back, please schedule with her when she returns.

## 2018-06-18 ENCOUNTER — Ambulatory Visit: Payer: PPO | Admitting: Family Medicine

## 2018-07-03 ENCOUNTER — Telehealth: Payer: Self-pay | Admitting: Family Medicine

## 2018-07-03 NOTE — Telephone Encounter (Signed)
mychart message sent to pt about their appointment with Dr Shaw °

## 2018-07-21 ENCOUNTER — Other Ambulatory Visit (HOSPITAL_COMMUNITY): Payer: Self-pay | Admitting: Internal Medicine

## 2018-07-25 ENCOUNTER — Other Ambulatory Visit: Payer: Self-pay | Admitting: Family Medicine

## 2018-07-26 ENCOUNTER — Telehealth: Payer: Self-pay | Admitting: *Deleted

## 2018-07-26 NOTE — Telephone Encounter (Signed)
Schedule AWV.  

## 2018-08-02 ENCOUNTER — Ambulatory Visit: Payer: PPO | Admitting: Family Medicine

## 2018-08-02 ENCOUNTER — Other Ambulatory Visit (HOSPITAL_COMMUNITY): Payer: Self-pay | Admitting: Vascular Surgery

## 2018-08-02 NOTE — Telephone Encounter (Signed)
Pt called to make f/u appt , pt is scheduled 4/15 phone Visit, PT needs refills on Coreg and Xarelto please advise

## 2018-08-03 MED ORDER — CARVEDILOL 12.5 MG PO TABS: ORAL_TABLET | ORAL | 0 refills | Status: DC

## 2018-08-03 MED ORDER — RIVAROXABAN 20 MG PO TABS: 20.0000 mg | ORAL_TABLET | Freq: Every day | ORAL | 0 refills | Status: DC

## 2018-08-03 NOTE — Telephone Encounter (Signed)
Pt lost to f/u.  1 month supply of Coreg and Xarelto sent to pharmacy. Message sent to pharmacy that patient needs to keep appt to obtain future refills.

## 2018-08-04 ENCOUNTER — Other Ambulatory Visit (HOSPITAL_COMMUNITY): Payer: Self-pay | Admitting: Internal Medicine

## 2018-08-15 ENCOUNTER — Other Ambulatory Visit: Payer: Self-pay

## 2018-08-15 ENCOUNTER — Ambulatory Visit (HOSPITAL_COMMUNITY)
Admission: RE | Admit: 2018-08-15 | Discharge: 2018-08-15 | Disposition: A | Discharge status: Home / Self Care | Payer: PRIVATE HEALTH INSURANCE | Source: Ambulatory Visit | Attending: Cardiology | Admitting: Cardiology

## 2018-08-15 ENCOUNTER — Telehealth (HOSPITAL_COMMUNITY): Payer: Self-pay

## 2018-08-15 DIAGNOSIS — I10 Essential (primary) hypertension: Secondary | ICD-10-CM

## 2018-08-15 DIAGNOSIS — I420 Dilated cardiomyopathy: Secondary | ICD-10-CM

## 2018-08-15 MED ORDER — SACUBITRIL-VALSARTAN 49-51 MG PO TABS: 1.0000 | ORAL_TABLET | Freq: Two times a day (BID) | ORAL | 3 refills | Status: DC

## 2018-08-15 MED ORDER — RIVAROXABAN 20 MG PO TABS: 20.0000 mg | ORAL_TABLET | Freq: Every day | ORAL | 0 refills | Status: DC

## 2018-08-15 MED ORDER — CARVEDILOL 12.5 MG PO TABS: ORAL_TABLET | ORAL | 0 refills | Status: DC

## 2018-08-15 NOTE — Progress Notes (Signed)
Heart Failure TeleHealth Note  Due to national recommendations of social distancing due to COVID 19, Audio/video telehealth visit is felt to be most appropriate for this patient at this time.  See MyChart message from today for patient consent regarding telehealth for [facility].  Date:  08/15/2018   ID:  Kaitlin Cantrell, DOB 1955-08-05, MRN [id number]  Location: Home  Provider location: [place] [facility] Type of Visit: Established patient   PCP:  Sherren Mocha, MD  Cardiologist:  Dr. Shirlee Latch  Chief Complaint: Exertional shortness of breath.   History of Present Illness: Kaitlin Cantrell is a 63 y.o. female who presents via audio/video conferencing for a telehealth visit today.     she denies symptoms worrisome for COVID 19.   Patient has a history of COPD, tobacco abuse, DM, HTN, arthritis, and chronic pain syndrome who presented to [facility] with SOB in 8/17.  She had had several months of increasing exertional dyspnea prior to this.  CT angio 12/29/15 showed submassive PE with concerns for right heart strain. Now on Xarelto.  Echo 12/30/15 showed LVEF 20-25%, Grade 3 DD, Mod MR, mod LAE, Mild RV dilation with mildly reduced systolic function, Moderate RAE, Moderate TR. Peak PA pressure 54 mm Hg.  Cardiac MRI (9/17) showed EF 15%, moderate LV dilation, mild RV dilation/moderately decreased RV systolic function, no LGE.  She was diuresed in the hospital and begun on Xarelto.  Smokes 1 ppd for many years.  She tried Chantix but did not stop. No ETOH or drug use. No history of cancer or recent surgery, no long car/plane trips prior to her PE.   I stopped her spironolactone due to persistent hyperkalemia in 9/17.    RHC/LHC in 12/17 showed no significant CAD, optimized filling pressures, low but not markedly low cardiac output.   Echo in 3/18 showed  EF improved to 55% with mild LVH.   She has been doing well symptomatically, but weight is up about 20 lbs over the  last year.  She is taking Lasix 4 days/week.  No significant exertional dyspnea.  No orthopnea/PND.  No chest pain.  No palpitations.  No BRBPR/melena.  She is still smoking.   PMH: 1. PE: 12/2015 CT angio 12/29/15 submassive PE with concerns for right heart strain. Now on Xarelto.  2. Cardiomyopathy:  Found 8/17.  - CMRI 01/01/2016: Nonischemic cardiomyopathy.  EF 15%, moderate LV dilation, mild RV dilation/moderately decreased RV systolic function.  No LGE: no infiltrative or inflammatory process noted, no evidence for prior MI. - ECHO  12/30/2015: EF 20-25%. Grade III DD, Mod TR, PASP 54, mildly decreased RV systolic function.  - Hyperkalemia with spironolactone.  - LHC/RHC (12/17): No CAD, mean RA 2, PA 39/13 mean 23, mean PCWP 7, CI 2.1.  - Echo (3/18): EF 55%, mild LVH.  3. ABIs 01/05/2016 normal   4. COPD: Active smoker.  5. Type II diabetes 6. HTN 7. Chronic pain syndrome 8. CKD stage 3: Suspect diabetic nephropathy.   Labs (9/17): SPEP negative, HIV negative, K 4.4, creatinine 1.22 Labs (10/17): LDL 111, HDL 32, TSH normal, K 5.2, creatinine 1.49, digoxin 0.7, BNP 245 Labs (12/17): K 4.4, creatinine 1.46 => 1.5, digoxin 0.9 Labs (1/18): K 4.6, creatinine 1.37, digoxin 1.3, LDL 83 Labs (9/18): K 4.9, creatinine 1.28  Labs (11/19): K 5.2, creatinine 1.5  Current Outpatient Medications  Medication Sig Dispense Refill  . atorvastatin (LIPITOR) 80 MG tablet TAKE 1 TABLET BY MOUTH ONCE  DAILY 6 PM 90 tablet 0  . betamethasone, augmented, (DIPROLENE) 0.05 % lotion Apply topically 2 (two) times daily. After completing up to 2 weeks of the halobetasol.  Then use at first sign of ecurrence 60 mL 4  . budesonide-formoterol (SYMBICORT) 160-4.5 MCG/ACT inhaler Inhale 1 puff into the lungs 2 (two) times daily. 3 Inhaler 4  . carvedilol (COREG) 12.5 MG tablet TAKE 1 TABLET BY MOUTH TWICE DAILY WITH A MEAL 180 tablet 0  . cyclobenzaprine (FLEXERIL) 10 MG tablet Take 1 tablet (10 mg total) by mouth 3  (three) times daily as needed. for muscle spams 270 tablet 1  . diazepam (VALIUM) 5 MG tablet TAKE 1 TABLET BY MOUTH AT BEDTIME AS NEEDED AND  MAY REPEAT DOSE ONE TIME IF NEEDED FOR MUSCLE SPASMS OR SEDATION. NEED VISIT 180 tablet 0  . furosemide (LASIX) 40 MG tablet Take 1 tab po every Mon, Wed, Fri, and Sat 50 tablet 3  . gabapentin (NEURONTIN) 400 MG capsule Take 1 capsule (400 mg total) by mouth 3 (three) times daily. 270 capsule 1  . glimepiride (AMARYL) 4 MG tablet Take 1 tablet by mouth twice daily 180 tablet 0  . halobetasol (ULTRAVATE) 0.05 % cream Apply topically 2 (two) times daily. For up to 2 weeks 50 g 0  . HYDROcodone-acetaminophen (NORCO) 7.5-325 MG tablet Take 1 tablet by mouth every 8 (eight) hours as needed for moderate pain. 90 tablet 0  . HYDROcodone-acetaminophen (NORCO) 7.5-325 MG tablet Take 1 tablet by mouth every 8 (eight) hours as needed for moderate pain. 90 tablet 0  . HYDROcodone-acetaminophen (NORCO) 7.5-325 MG tablet Take 1 tablet by mouth every 8 (eight) hours as needed for moderate pain. 90 tablet 0  . Insulin Glargine (LANTUS SOLOSTAR) 100 UNIT/ML Solostar Pen Inject 15 Units into the skin daily at 10 pm. 5 pen 1  . Insulin Pen Needle (PEN NEEDLES) 32G X 4 MM MISC 1 Units by Does not apply route daily. 30 each 1  . ipratropium (ATROVENT HFA) 17 MCG/ACT inhaler Inhale 2 puffs into the lungs every 4 (four) hours as needed for wheezing. 3 Inhaler 0  . Ipratropium-Albuterol (COMBIVENT) 20-100 MCG/ACT AERS respimat Inhale 1 puff into the lungs every 6 (six) hours. 3 Inhaler 0  . Krill Oil CAPS Take 1 capsule by mouth daily.     . metFORMIN (GLUCOPHAGE) 850 MG tablet TAKE 1 TABLET BY MOUTH TWICE DAILY WITH A MEAL 180 tablet 0  . mupirocin ointment (BACTROBAN) 2 % Apply 1 application 4 (four) times daily topically. 30 g 1  . ranitidine (ZANTAC) 150 MG tablet Take 1 tablet (150 mg total) by mouth 2 (two) times daily. 180 tablet 3  . rivaroxaban (XARELTO) 20 MG TABS tablet  Take 1 tablet (20 mg total) by mouth daily. 90 tablet 0  . sacubitril-valsartan (ENTRESTO) 49-51 MG Take 1 tablet by mouth 2 (two) times daily. 180 tablet 3  . tiotropium (SPIRIVA HANDIHALER) 18 MCG inhalation capsule Place 1 capsule (18 mcg total) into inhaler and inhale daily. 90 capsule 1  . traMADol (ULTRAM) 50 MG tablet Take 2 tablets (100 mg total) by mouth every 6 (six) hours as needed for moderate pain. 240 tablet 1   No current facility-administered medications for this encounter.     Allergies:   Albuterol and Morphine and related   Social History:  The patient  reports that she has been smoking cigarettes. She has a 40.00 pack-year smoking history. She has never used smokeless tobacco. She  reports current alcohol use. She reports that she does not use drugs.   Family History:  The patient's family history includes COPD in her paternal grandfather; Cancer in her maternal grandmother; Colon polyps in her father; Diabetes in her brother, father, mother, and paternal grandmother; Heart disease in her father, paternal grandfather, and paternal grandmother; Heart murmur in her mother; Hypertension in her brother, brother, and mother.   ROS:  Please see the history of present illness.   All other systems are personally reviewed and negative.   Exam:  (Video/Tele Health Call; Exam is subjective and or/visual.) General:  Speaks in full sentences. No resp difficulty. Obese.  Neck: Thick, no JVD.  Lungs: Normal respiratory effort with conversation.  Abdomen: Non-distended per patient report Extremities: Pt denies edema. Neuro: Alert & oriented x 3.   Recent Labs: 03/10/2018: ALT 5; BUN 24; Creatinine, Ser 1.53; Hemoglobin 10.5; Platelets 334; Potassium 5.2; Sodium 142; TSH 4.390  Personally reviewed   Wt Readings from Last 3 Encounters:  03/10/18 111.7 kg (246 lb 3.2 oz)  12/27/17 108.4 kg (239 lb)  10/31/17 107.5 kg (237 lb)      ASSESSMENT AND PLAN:  1. H/O PE: Submassive on CT.  Etiology uncertain: no known cancer, long trip, recent surgery.  Possibly related to stasis in the setting of cardiomyopathy with severely decreased systolic function.  - Given cardiomyopathy and submassive PE with no certain trigger, think she will need long-term anticoagulation. She will continue Xarelto. I will arrange for CBC.  2. Chronic systolic CHF: Nonischemic cardiomyopathy.  ECHO 12/2015 EF 15-20% with diffuse hypokinesis. Uncertain etiology. No chest pain. Symptoms began as orthopnea around 6/17, then developed progressive exertional dyspnea. ECG shows anteroseptal MI but no change from 2013 ECG. Cardiac MRI did not show LGE. HIV negative, thyroid indices ok (mild TSH elevation but normal T3/T4), SPEP negative.  NYHA class II currently.  12/17 RHC/LHC showed no angiographic CAD, optimized filling pressures, low but not markedly low cardiac output.  Echo 3/18 showed recovery of LV function with EF up to 55%.  On exam today, she does not look volume overloaded.  NYHA class II symptoms.   - Continue Coreg 12.5 mg bid.   - Continue Entresto 49/51 bid.   - Off spironolactone with persistent hyperkalemia.   - Continue Lasix 40 every other day. Repeat BMET. - I will have her repeat an echo at followup in 3 months to make sure that EF stays up.   3. CKD: Stage III.  BMET today.  4. Active smoker: Counseled to quit.   5. Hyperlipidemia: Check lipids today, she is on atorvastatin.  6. Obesity: Weight is up, think caloric rather than CHF.  Encouraged her to work on diet and exercise.   COVID screen The patient does not have any symptoms that suggest any further testing/ screening at this time.  Social distancing reinforced today.  Relevant cardiac medications were reviewed at length with the patient today.   The patient does not have concerns regarding their medications at this time.   Recommended follow-up:  3 months with echo.   Today, I have spent 21 minutes with the patient with  telehealth technology discussing the above issues .    Signed, Marca Ancona, MD  08/15/2018 10:36 PM  [facility] [address] [facility] [place] [address] [phone] (office) [phone] (fax)

## 2018-08-15 NOTE — Patient Instructions (Addendum)
1. Arrange CBC, BMET, lipids. 4/22 @1230  2. Needs refills on Xarelto, Entresto, Coreg, confirmed correct pharm 3. Followup in 3 months with echo, routed to [facility]

## 2018-08-15 NOTE — Telephone Encounter (Signed)
Reviewed AVS  1. Arrange CBC, BMET, lipids. 4/22 @1230  2. Needs refills on Xarelto, Entresto, Coreg, confirmed correct pharm 3. Followup in 3 months with echo, routed to [facility]  Denied any further questions

## 2018-08-22 ENCOUNTER — Ambulatory Visit (HOSPITAL_COMMUNITY)
Admission: RE | Admit: 2018-08-22 | Discharge: 2018-08-22 | Disposition: A | Discharge status: Home / Self Care | Payer: PPO | Source: Ambulatory Visit | Attending: Cardiology | Admitting: Cardiology

## 2018-08-22 ENCOUNTER — Other Ambulatory Visit: Payer: Self-pay

## 2018-08-22 DIAGNOSIS — I10 Essential (primary) hypertension: Secondary | ICD-10-CM | POA: Insufficient documentation

## 2018-08-22 LAB — CBC
HCT: 34 % — ABNORMAL LOW (ref 36.0–46.0)
Hemoglobin: 10.4 g/dL — ABNORMAL LOW (ref 12.0–15.0)
MCH: 26.2 pg (ref 26.0–34.0)
MCHC: 30.6 g/dL (ref 30.0–36.0)
MCV: 85.6 fL (ref 80.0–100.0)
Platelets: 319 10*3/uL (ref 150–400)
RBC: 3.97 MIL/uL (ref 3.87–5.11)
RDW: 14.4 % (ref 11.5–15.5)
WBC: 9.8 10*3/uL (ref 4.0–10.5)
nRBC: 0 % (ref 0.0–0.2)

## 2018-08-22 LAB — LIPID PANEL
Cholesterol: 150 mg/dL (ref 0–200)
HDL: 35 mg/dL — ABNORMAL LOW (ref >40)
LDL Cholesterol: 91 mg/dL (ref 0–99)
Total CHOL/HDL Ratio: 4.3 RATIO
Triglycerides: 121 mg/dL (ref <150)
VLDL: 24 mg/dL (ref 0–40)

## 2018-08-22 LAB — BASIC METABOLIC PANEL
Anion gap: 12 (ref 5–15)
BUN: 22 mg/dL — ABNORMAL HIGH (ref 6–20)
CO2: 25 mmol/L (ref 22–32)
Calcium: 9.2 mg/dL (ref 8.9–10.3)
Chloride: 99 mmol/L (ref 98–111)
Creatinine, Ser: 1.59 mg/dL — ABNORMAL HIGH (ref 0.44–1.00)
GFR calc Af Amer: 40 mL/min — ABNORMAL LOW (ref >60)
GFR calc non Af Amer: 35 mL/min — ABNORMAL LOW (ref >60)
Glucose, Bld: 152 mg/dL — ABNORMAL HIGH (ref 70–99)
Potassium: 5 mmol/L (ref 3.5–5.1)
Sodium: 136 mmol/L (ref 135–145)

## 2018-09-01 ENCOUNTER — Other Ambulatory Visit: Payer: Self-pay | Admitting: Family Medicine

## 2018-09-17 ENCOUNTER — Encounter: Payer: Self-pay | Admitting: Registered Nurse

## 2018-09-17 ENCOUNTER — Telehealth (INDEPENDENT_AMBULATORY_CARE_PROVIDER_SITE_OTHER): Payer: PPO | Admitting: Registered Nurse

## 2018-09-17 ENCOUNTER — Other Ambulatory Visit: Payer: Self-pay

## 2018-09-17 DIAGNOSIS — G894 Chronic pain syndrome: Secondary | ICD-10-CM

## 2018-09-17 DIAGNOSIS — E782 Mixed hyperlipidemia: Secondary | ICD-10-CM

## 2018-09-17 DIAGNOSIS — Z7689 Persons encountering health services in other specified circumstances: Secondary | ICD-10-CM

## 2018-09-17 DIAGNOSIS — E119 Type 2 diabetes mellitus without complications: Secondary | ICD-10-CM

## 2018-09-17 MED ORDER — TRAMADOL HCL 50 MG PO TABS: 100.0000 mg | ORAL_TABLET | Freq: Four times a day (QID) | ORAL | 1 refills | Status: DC | PRN

## 2018-09-17 MED ORDER — HYDROCODONE-ACETAMINOPHEN 7.5-325 MG PO TABS: 1.0000 | ORAL_TABLET | Freq: Three times a day (TID) | ORAL | 0 refills | Status: DC | PRN

## 2018-09-17 MED ORDER — ATORVASTATIN CALCIUM 80 MG PO TABS: ORAL_TABLET | ORAL | 0 refills | Status: DC

## 2018-09-17 NOTE — Progress Notes (Signed)
Established Patient Office Visit  Subjective:  Patient ID: Kaitlin BlalockDonna R Lininger, female    DOB: 05/31/1955  Age: 63 y.o. MRN: [id number]  CC: No chief complaint on file.  This visit took place via telehealth. The patient is aware that a telehealth visit stands in place of an in-person visit and is billed as such. The patient was identified using DOB and address. An interpreter was not used.   HPI Kaitlin Cantrell presents to establish care with myself as PCP and refill medications. She is interested in pursuing care with Dr. Clelia CroftShaw at her new practice, however, is unsure when that will open and is concerned that her insurance won't be accepted.   She has a mhx significant for HTN, CHF, Cardiomyopathy, COPD, PE, T2DM, OA, HLD, Chronic Pain  HTN, CHF, Cardiomyopathy: Managed by Cardiologist Dr. Shirlee LatchMcLean. She is happy with the care she receives here and follows up routinely. Had appt 08/22/18 and upcoming 11/14/18. Will continue to monitor.  COPD: Feels that this is well controlled at this time, denies sxs of hypoxia including SOB, lightheadedness, dizziness, LOC. States that "if you get coughing you can feel it", but not very concerned at this time. PE was in 2017, no sxs since.  T2DM: Does not regularly monitor glucose. A1c within controlled range at this time: 6.5, 6.7,6.6,6.8 are last readings from 12/2016 to present. Will recheck A1c, likely to stay on current regimen.   HLD: 80mg  lipitor, last lipid panel 08/22/2018 with cardiology. Low HLD, others wnl   OA and Chronic Pain: Patient has substantial somatic and neuropathic pain. She had discussed previously a referral to Pain Medicine with Dr. Clelia CroftShaw - we discussed this again today and the patient will be referred.   Past Medical History:  Diagnosis Date  . Allergy    seasonal  . Arthritis   . Diabetes mellitus   . GERD (gastroesophageal reflux disease)   . History of pneumonia    15 years ago  . Hyperlipidemia   . Hypertension   . PE  (pulmonary embolism) 12/2015    Past Surgical History:  Procedure Laterality Date  . ANKLE FRACTURE SURGERY  1974   left and pinned then pins removed  . ANKLE GANGLION CYST EXCISION  1994   right  . BACK SURGERY  1998   discectomy and then fusion  . CARDIAC CATHETERIZATION N/A 04/05/2016   Procedure: Right/Left Heart Cath and Coronary Angiography;  Surgeon: Laurey Moralealton S McLean, MD;  [facility];  Service: Cardiovascular;  Laterality: N/A;  . KNEE ARTHROPLASTY  02/14/2012   Procedure: COMPUTER ASSISTED TOTAL KNEE ARTHROPLASTY;  Surgeon: Cammy CopaGregory Scott Dean, MD;  [facility];  Service: Orthopedics;  Laterality: Left;  Left total knee arthroplasty  . TONSILLECTOMY  1972  . TUBAL LIGATION  1996  . UMBILICAL HERNIA REPAIR  1996    Family History  Problem Relation Age of Onset  . Colon polyps Father   . Diabetes Father        mother  . Heart disease Father   . Heart murmur Mother   . Diabetes Mother   . Hypertension Mother   . Diabetes Brother   . Cancer Maternal Grandmother   . Diabetes Paternal Grandmother   . Heart disease Paternal Grandmother   . COPD Paternal Grandfather   . Heart disease Paternal Grandfather   . Hypertension Brother   . Hypertension Brother     Social History   Socioeconomic History  . Marital status: Married  Spouse name: Not on file  . Number of children: 0  . Years of education: Not on file  . Highest education level: Not on file  Occupational History  . Occupation: retired  Engineer, production  . Financial resource strain: Not hard at all  . Food insecurity:    Worry: Never true    Inability: Never true  . Transportation needs:    Medical: No    Non-medical: No  Tobacco Use  . Smoking status: Current Every Day Smoker    Packs/day: 1.00    Years: 45.00    Pack years: 45.00    Types: Cigarettes  . Smokeless tobacco: Never Used  Substance and Sexual Activity  . Alcohol use: Yes    Comment: rarely  . Drug use: No  . Sexual  activity: Not Currently  Lifestyle  . Physical activity:    Days per week: 3 days    Minutes per session: 20 min  . Stress: Only a little  Relationships  . Social connections:    Talks on phone: More than three times a week    Gets together: More than three times a week    Attends religious service: Never    Active member of club or organization: No    Attends meetings of clubs or organizations: Never    Relationship status: Married  . Intimate partner violence:    Fear of current or ex partner: No    Emotionally abused: No    Physically abused: No    Forced sexual activity: No  Other Topics Concern  . Not on file  Social History Narrative   Exercise walking daily (varies)    Outpatient Medications Prior to Visit  Medication Sig Dispense Refill  . atorvastatin (LIPITOR) 80 MG tablet TAKE 1 TABLET BY MOUTH ONCE DAILY AT  6PM 90 tablet 0  . betamethasone, augmented, (DIPROLENE) 0.05 % lotion Apply topically 2 (two) times daily. After completing up to 2 weeks of the halobetasol.  Then use at first sign of ecurrence 60 mL 4  . budesonide-formoterol (SYMBICORT) 160-4.5 MCG/ACT inhaler Inhale 1 puff into the lungs 2 (two) times daily. 3 Inhaler 4  . carvedilol (COREG) 12.5 MG tablet TAKE 1 TABLET BY MOUTH TWICE DAILY WITH A MEAL 180 tablet 0  . cyclobenzaprine (FLEXERIL) 10 MG tablet Take 1 tablet (10 mg total) by mouth 3 (three) times daily as needed. for muscle spams 270 tablet 1  . diazepam (VALIUM) 5 MG tablet TAKE 1 TABLET BY MOUTH AT BEDTIME AS NEEDED AND  MAY REPEAT DOSE ONE TIME IF NEEDED FOR MUSCLE SPASMS OR SEDATION. NEED VISIT 180 tablet 0  . furosemide (LASIX) 40 MG tablet Take 1 tab po every Mon, Wed, Fri, and Sat 50 tablet 3  . gabapentin (NEURONTIN) 400 MG capsule Take 1 capsule (400 mg total) by mouth 3 (three) times daily. 270 capsule 1  . glimepiride (AMARYL) 4 MG tablet Take 1 tablet by mouth twice daily 180 tablet 0  . halobetasol (ULTRAVATE) 0.05 % cream Apply  topically 2 (two) times daily. For up to 2 weeks 50 g 0  . HYDROcodone-acetaminophen (NORCO) 7.5-325 MG tablet Take 1 tablet by mouth every 8 (eight) hours as needed for moderate pain. 90 tablet 0  . Insulin Glargine (LANTUS SOLOSTAR) 100 UNIT/ML Solostar Pen Inject 15 Units into the skin daily at 10 pm. 5 pen 1  . Insulin Pen Needle (PEN NEEDLES) 32G X 4 MM MISC 1 Units by Does not apply route  daily. 30 each 1  . ipratropium (ATROVENT HFA) 17 MCG/ACT inhaler Inhale 2 puffs into the lungs every 4 (four) hours as needed for wheezing. 3 Inhaler 0  . Ipratropium-Albuterol (COMBIVENT) 20-100 MCG/ACT AERS respimat Inhale 1 puff into the lungs every 6 (six) hours. 3 Inhaler 0  . Krill Oil CAPS Take 1 capsule by mouth daily.     . metFORMIN (GLUCOPHAGE) 850 MG tablet TAKE 1 TABLET BY MOUTH TWICE DAILY WITH A MEAL 180 tablet 0  . mupirocin ointment (BACTROBAN) 2 % Apply 1 application 4 (four) times daily topically. 30 g 1  . ranitidine (ZANTAC) 150 MG tablet Take 1 tablet (150 mg total) by mouth 2 (two) times daily. 180 tablet 3  . rivaroxaban (XARELTO) 20 MG TABS tablet Take 1 tablet (20 mg total) by mouth daily. 90 tablet 0  . sacubitril-valsartan (ENTRESTO) 49-51 MG Take 1 tablet by mouth 2 (two) times daily. 180 tablet 3  . tiotropium (SPIRIVA HANDIHALER) 18 MCG inhalation capsule Place 1 capsule (18 mcg total) into inhaler and inhale daily. 90 capsule 1  . traMADol (ULTRAM) 50 MG tablet Take 2 tablets (100 mg total) by mouth every 6 (six) hours as needed for moderate pain. 240 tablet 1  . HYDROcodone-acetaminophen (NORCO) 7.5-325 MG tablet Take 1 tablet by mouth every 8 (eight) hours as needed for moderate pain. 90 tablet 0  . HYDROcodone-acetaminophen (NORCO) 7.5-325 MG tablet Take 1 tablet by mouth every 8 (eight) hours as needed for moderate pain. 90 tablet 0   No facility-administered medications prior to visit.     Allergies  Allergen Reactions  . Albuterol Anaphylaxis    Throat/lips  swelling  . Morphine And Related Other (See Comments)    Very aggressive and doesn't help pain    ROS Review of Systems  Constitutional: Negative.   HENT: Negative.   Eyes: Negative.   Respiratory: Negative.  Negative for shortness of breath and wheezing.   Cardiovascular: Negative for chest pain and leg swelling.  Gastrointestinal: Negative.   Endocrine: Negative.   Genitourinary: Negative.   Musculoskeletal: Positive for arthralgias and myalgias.  Skin: Negative.   Allergic/Immunologic: Negative.   Neurological: Negative.   Hematological: Negative.   Psychiatric/Behavioral: Negative.       Objective:    Physical Exam  There were no vitals taken for this visit. Wt Readings from Last 3 Encounters:  03/10/18 246 lb 3.2 oz (111.7 kg)  12/27/17 239 lb (108.4 kg)  10/31/17 237 lb (107.5 kg)   Home BP reported as: 113/72  Health Maintenance Due  Topic Date Due  . PAP SMEAR-Modifier  03/10/2014  . OPHTHALMOLOGY EXAM  03/30/2017  . FOOT EXAM  05/17/2018  . URINE MICROALBUMIN  05/17/2018  . HEMOGLOBIN A1C  09/08/2018    There are no preventive care reminders to display for this patient.  Lab Results  Component Value Date   TSH 4.390 03/10/2018   Lab Results  Component Value Date   WBC 9.8 08/22/2018   HGB 10.4 (L) 08/22/2018   HCT 34.0 (L) 08/22/2018   MCV 85.6 08/22/2018   PLT 319 08/22/2018   Lab Results  Component Value Date   NA 136 08/22/2018   K 5.0 08/22/2018   CO2 25 08/22/2018   GLUCOSE 152 (H) 08/22/2018   BUN 22 (H) 08/22/2018   CREATININE 1.59 (H) 08/22/2018   BILITOT <0.2 03/10/2018   ALKPHOS 118 (H) 03/10/2018   AST 12 03/10/2018   ALT 5 03/10/2018   PROT 6.7  03/10/2018   ALBUMIN 3.7 03/10/2018   CALCIUM 9.2 08/22/2018   ANIONGAP 12 08/22/2018   Lab Results  Component Value Date   CHOL 150 08/22/2018   Lab Results  Component Value Date   HDL 35 (L) 08/22/2018   Lab Results  Component Value Date   LDLCALC 91 08/22/2018   Lab  Results  Component Value Date   TRIG 121 08/22/2018   Lab Results  Component Value Date   CHOLHDL 4.3 08/22/2018   Lab Results  Component Value Date   HGBA1C 6.5 (H) 03/10/2018      Assessment & Plan:   Problem List Items Addressed This Visit      Other   Hyperlipidemia   Relevant Medications   atorvastatin (LIPITOR) 80 MG tablet   Chronic pain syndrome   Relevant Medications   HYDROcodone-acetaminophen (NORCO) 7.5-325 MG tablet   traMADol (ULTRAM) 50 MG tablet    Other Visit Diagnoses    Encounter to establish care    -  Primary      Meds ordered this encounter  Medications  . atorvastatin (LIPITOR) 80 MG tablet    Sig: TAKE 1 TABLET BY MOUTH ONCE DAILY AT  6PM    Dispense:  90 tablet    Refill:  0    Due for follow up appt    Order Specific Question:   Supervising Provider    Answer:   Collie Siad A [id number]  . HYDROcodone-acetaminophen (NORCO) 7.5-325 MG tablet    Sig: Take 1 tablet by mouth every 8 (eight) hours as needed for moderate pain.    Dispense:  90 tablet    Refill:  0    Order Specific Question:   Supervising Provider    Answer:   Collie Siad A [id number]  . traMADol (ULTRAM) 50 MG tablet    Sig: Take 2 tablets (100 mg total) by mouth every 6 (six) hours as needed for moderate pain.    Dispense:  240 tablet    Refill:  1    Order Specific Question:   Supervising Provider    Answer:   Doristine Bosworth [id number]    PLAN:  Medications refilled as above. Patient and I reached a mutual understanding that this will be the last refill of NORCO and Ultram from our office. We discussed that a referral to Pain Medicine is not about judgement or assumptions against the patient - rather, it's putting the prescription of opioids for chronic pain in the most capable hands to handle these medications and address pain most effectively.   Patient will report to clinic for labs - A1c. Recent CMP, lipids and CBC obtained by cardiology. TSH in 11/19 wnl.   We discussed following up for other refills as needed and discussed coming to the office for a visit post-COVID 19 or sooner if needed. Patient is high risk for complications if dx with COVID - should not come to office unless absolutely necessary.  Patient encouraged to call clinic with any questions, comments, or concerns.   Follow-up: No follow-ups on file.    Thank you for your visit with Primary Care at [facility] today.  [name], NP [facility] [address] [place], [place] [address] [phone] - [phone]

## 2018-09-17 NOTE — Addendum Note (Signed)
Addended by: Golden Hurter on: 09/17/2018 03:40 PM   Modules accepted: Orders

## 2018-09-17 NOTE — Progress Notes (Signed)
Spoke to pt and she states she needs medication refills at this time. She would be considered a TOC pt due to Dr. Clelia Croft leaving the practice. Pt states she needs refills for her Tramadol,Hydrocodone,and Valium refilled. Also may need some blood work.

## 2018-10-17 ENCOUNTER — Encounter: Payer: Self-pay | Admitting: Registered Nurse

## 2018-10-17 ENCOUNTER — Other Ambulatory Visit: Payer: Self-pay | Admitting: Registered Nurse

## 2018-10-17 DIAGNOSIS — G894 Chronic pain syndrome: Secondary | ICD-10-CM

## 2018-10-18 ENCOUNTER — Other Ambulatory Visit: Payer: Self-pay

## 2018-10-18 ENCOUNTER — Encounter: Payer: Self-pay | Admitting: Registered Nurse

## 2018-10-18 DIAGNOSIS — M1712 Unilateral primary osteoarthritis, left knee: Secondary | ICD-10-CM

## 2018-10-18 DIAGNOSIS — G894 Chronic pain syndrome: Secondary | ICD-10-CM

## 2018-10-18 MED ORDER — HYDROCODONE-ACETAMINOPHEN 7.5-325 MG PO TABS: 1.0000 | ORAL_TABLET | Freq: Three times a day (TID) | ORAL | 0 refills | Status: DC | PRN

## 2018-10-18 NOTE — Telephone Encounter (Signed)
Good afternoon Rich,   Ms. Nored is requesting a refill of her hydrocodone.  It seems that a referral to pain management was discussed in her recent OV but wasn't able to find that a referral had been entered.  We entered the referral today but it may take up to two weeks before pain management contacts her for an appointment.   Please let me know if you need anything else from me.   Thank you,  Wilfred Curtis, RN

## 2018-10-19 ENCOUNTER — Other Ambulatory Visit: Payer: Self-pay | Admitting: Registered Nurse

## 2018-10-19 ENCOUNTER — Other Ambulatory Visit: Payer: Self-pay

## 2018-10-19 DIAGNOSIS — E119 Type 2 diabetes mellitus without complications: Secondary | ICD-10-CM

## 2018-10-19 DIAGNOSIS — G894 Chronic pain syndrome: Secondary | ICD-10-CM

## 2018-10-19 MED ORDER — CYCLOBENZAPRINE HCL 10 MG PO TABS: 10.0000 mg | ORAL_TABLET | Freq: Three times a day (TID) | ORAL | 1 refills | Status: DC | PRN

## 2018-10-19 MED ORDER — GLIMEPIRIDE 4 MG PO TABS: 4.0000 mg | ORAL_TABLET | Freq: Two times a day (BID) | ORAL | 0 refills | Status: DC

## 2018-10-19 MED ORDER — DIAZEPAM 5 MG PO TABS: ORAL_TABLET | ORAL | 0 refills | Status: DC

## 2018-10-19 NOTE — Progress Notes (Signed)
Refills sent.  Thank you,  Kathrin Ruddy, NP

## 2018-10-29 ENCOUNTER — Other Ambulatory Visit: Payer: Self-pay | Admitting: Registered Nurse

## 2018-10-29 ENCOUNTER — Other Ambulatory Visit: Payer: Self-pay | Admitting: *Deleted

## 2018-10-29 ENCOUNTER — Encounter: Payer: Self-pay | Admitting: Registered Nurse

## 2018-10-29 DIAGNOSIS — G894 Chronic pain syndrome: Secondary | ICD-10-CM

## 2018-10-29 DIAGNOSIS — E119 Type 2 diabetes mellitus without complications: Secondary | ICD-10-CM

## 2018-10-29 MED ORDER — METFORMIN HCL 850 MG PO TABS: ORAL_TABLET | ORAL | 1 refills | Status: DC

## 2018-10-29 MED ORDER — GABAPENTIN 400 MG PO CAPS: 300.0000 mg | ORAL_CAPSULE | Freq: Three times a day (TID) | ORAL | 1 refills | Status: DC

## 2018-11-14 ENCOUNTER — Encounter (HOSPITAL_COMMUNITY): Payer: Self-pay | Admitting: Cardiology

## 2018-11-14 ENCOUNTER — Ambulatory Visit (HOSPITAL_BASED_OUTPATIENT_CLINIC_OR_DEPARTMENT_OTHER)
Admission: RE | Admit: 2018-11-14 | Discharge: 2018-11-14 | Disposition: A | Discharge status: Home / Self Care | Payer: PPO | Source: Ambulatory Visit | Attending: Cardiology | Admitting: Cardiology

## 2018-11-14 ENCOUNTER — Other Ambulatory Visit: Payer: Self-pay

## 2018-11-14 ENCOUNTER — Ambulatory Visit (HOSPITAL_COMMUNITY)
Admission: RE | Admit: 2018-11-14 | Discharge: 2018-11-14 | Disposition: A | Discharge status: Home / Self Care | Payer: PPO | Source: Ambulatory Visit | Attending: Registered Nurse | Admitting: Registered Nurse

## 2018-11-14 VITALS — BP 151/78 | HR 68 | Wt 253.8 lb

## 2018-11-14 DIAGNOSIS — I10 Essential (primary) hypertension: Secondary | ICD-10-CM

## 2018-11-14 DIAGNOSIS — Z79899 Other long term (current) drug therapy: Secondary | ICD-10-CM | POA: Insufficient documentation

## 2018-11-14 DIAGNOSIS — Z885 Allergy status to narcotic agent status: Secondary | ICD-10-CM | POA: Insufficient documentation

## 2018-11-14 DIAGNOSIS — Z8249 Family history of ischemic heart disease and other diseases of the circulatory system: Secondary | ICD-10-CM | POA: Diagnosis not present

## 2018-11-14 DIAGNOSIS — I420 Dilated cardiomyopathy: Secondary | ICD-10-CM | POA: Diagnosis not present

## 2018-11-14 DIAGNOSIS — J90 Pleural effusion, not elsewhere classified: Secondary | ICD-10-CM | POA: Insufficient documentation

## 2018-11-14 DIAGNOSIS — Z809 Family history of malignant neoplasm, unspecified: Secondary | ICD-10-CM | POA: Diagnosis not present

## 2018-11-14 DIAGNOSIS — I5041 Acute combined systolic (congestive) and diastolic (congestive) heart failure: Secondary | ICD-10-CM

## 2018-11-14 DIAGNOSIS — I130 Hypertensive heart and chronic kidney disease with heart failure and stage 1 through stage 4 chronic kidney disease, or unspecified chronic kidney disease: Secondary | ICD-10-CM | POA: Insufficient documentation

## 2018-11-14 DIAGNOSIS — Z794 Long term (current) use of insulin: Secondary | ICD-10-CM | POA: Insufficient documentation

## 2018-11-14 DIAGNOSIS — F17210 Nicotine dependence, cigarettes, uncomplicated: Secondary | ICD-10-CM | POA: Diagnosis not present

## 2018-11-14 DIAGNOSIS — Z833 Family history of diabetes mellitus: Secondary | ICD-10-CM | POA: Diagnosis not present

## 2018-11-14 DIAGNOSIS — Z86711 Personal history of pulmonary embolism: Secondary | ICD-10-CM | POA: Diagnosis not present

## 2018-11-14 DIAGNOSIS — M62838 Other muscle spasm: Secondary | ICD-10-CM | POA: Insufficient documentation

## 2018-11-14 DIAGNOSIS — E875 Hyperkalemia: Secondary | ICD-10-CM | POA: Diagnosis not present

## 2018-11-14 DIAGNOSIS — I428 Other cardiomyopathies: Secondary | ICD-10-CM | POA: Diagnosis not present

## 2018-11-14 DIAGNOSIS — N183 Chronic kidney disease, stage 3 unspecified: Secondary | ICD-10-CM

## 2018-11-14 DIAGNOSIS — M1990 Unspecified osteoarthritis, unspecified site: Secondary | ICD-10-CM | POA: Diagnosis not present

## 2018-11-14 DIAGNOSIS — E669 Obesity, unspecified: Secondary | ICD-10-CM | POA: Diagnosis not present

## 2018-11-14 DIAGNOSIS — I5042 Chronic combined systolic (congestive) and diastolic (congestive) heart failure: Secondary | ICD-10-CM | POA: Diagnosis not present

## 2018-11-14 DIAGNOSIS — J449 Chronic obstructive pulmonary disease, unspecified: Secondary | ICD-10-CM | POA: Insufficient documentation

## 2018-11-14 DIAGNOSIS — Z6841 Body Mass Index (BMI) 40.0 and over, adult: Secondary | ICD-10-CM | POA: Insufficient documentation

## 2018-11-14 DIAGNOSIS — E785 Hyperlipidemia, unspecified: Secondary | ICD-10-CM | POA: Diagnosis not present

## 2018-11-14 DIAGNOSIS — G894 Chronic pain syndrome: Secondary | ICD-10-CM | POA: Insufficient documentation

## 2018-11-14 DIAGNOSIS — Z836 Family history of other diseases of the respiratory system: Secondary | ICD-10-CM | POA: Insufficient documentation

## 2018-11-14 DIAGNOSIS — Z7951 Long term (current) use of inhaled steroids: Secondary | ICD-10-CM | POA: Insufficient documentation

## 2018-11-14 DIAGNOSIS — Z7901 Long term (current) use of anticoagulants: Secondary | ICD-10-CM | POA: Diagnosis not present

## 2018-11-14 DIAGNOSIS — E1122 Type 2 diabetes mellitus with diabetic chronic kidney disease: Secondary | ICD-10-CM | POA: Insufficient documentation

## 2018-11-14 LAB — BASIC METABOLIC PANEL
Anion gap: 13 (ref 5–15)
BUN: 17 mg/dL (ref 6–20)
CO2: 21 mmol/L — ABNORMAL LOW (ref 22–32)
Calcium: 9.3 mg/dL (ref 8.9–10.3)
Chloride: 98 mmol/L (ref 98–111)
Creatinine, Ser: 1.41 mg/dL — ABNORMAL HIGH (ref 0.44–1.00)
GFR calc Af Amer: 47 mL/min — ABNORMAL LOW (ref >60)
GFR calc non Af Amer: 40 mL/min — ABNORMAL LOW (ref >60)
Glucose, Bld: 134 mg/dL — ABNORMAL HIGH (ref 70–99)
Potassium: 5.1 mmol/L (ref 3.5–5.1)
Sodium: 132 mmol/L — ABNORMAL LOW (ref 135–145)

## 2018-11-14 NOTE — Patient Instructions (Signed)
Labs were done today. We will call you with any ABNORMAL results. No news is good news!  Please have lab (Basic Metabolic Panel) work done every 3 months.  Please work of quitting smoking.  Your physician wants you to follow-up in: 1 year. You will receive a reminder letter in the mail two months in advance. If you don't receive a letter, please call our office to schedule the follow-up appointment.

## 2018-11-14 NOTE — Progress Notes (Signed)
  Echocardiogram 2D Echocardiogram has been performed.  Kaitlin Cantrell 11/14/2018, 9:46 AM

## 2018-11-14 NOTE — Progress Notes (Signed)
Date:  11/14/2018   ID:  Gardiner Coins, DOB April 23, 1956, MRN [id number]   Provider location: [facility] Type of Visit: Established patient   PCP:  Maximiano Coss, NP  Cardiologist:  Dr. Aundra Dubin  Chief Complaint: Exertional shortness of breath.   History of Present Illness: Kaitlin Cantrell is a 63 y.o. female who has a history of COPD, tobacco abuse, DM, HTN, arthritis, and chronic pain syndrome and presented to [facility] with SOB in 8/17.  She had had several months of increasing exertional dyspnea prior to this.  CT angio 12/29/15 showed submassive PE with concerns for right heart strain. Now on Xarelto.  Echo 12/30/15 showed LVEF 20-25%, Grade 3 DD, Mod MR, mod LAE, Mild RV dilation with mildly reduced systolic function, Moderate RAE, Moderate TR. Peak PA pressure 54 mm Hg.  Cardiac MRI (9/17) showed EF 15%, moderate LV dilation, mild RV dilation/moderately decreased RV systolic function, no LGE.  She was diuresed in the hospital and begun on Xarelto.  Smokes 1 ppd for many years.  She tried Chantix but did not stop. No ETOH or drug use. No history of cancer or recent surgery, no long car/plane trips prior to her PE.   I stopped her spironolactone due to persistent hyperkalemia in 9/17.    RHC/LHC in 12/17 showed no significant CAD, optimized filling pressures, low but not markedly low cardiac output.   Echo in 3/18 showed  EF improved to 55% with mild LVH.  Repeat echo today shows that EF remains 55-60%.    She returns for followup of CHF.  Weight is up, she has been eating more and exercising less with coronavirus pandemic.  She still smokes 1/2 ppd, failed Chantix.  BP is high in the office today but generally runs in the systolic 546T range at home.  No dyspnea walking on flat ground.  She gets tired walking up a flight of stairs.  No chest pain.  No orthopnea/PND.    PMH: 1. PE: 12/2015 CT angio 12/29/15 submassive PE with concerns for right heart strain.  Now on Xarelto.  2. Cardiomyopathy:  Found 8/17.  - CMRI 01/01/2016: Nonischemic cardiomyopathy.  EF 15%, moderate LV dilation, mild RV dilation/moderately decreased RV systolic function.  No LGE: no infiltrative or inflammatory process noted, no evidence for prior MI. - ECHO  12/30/2015: EF 20-25%. Grade III DD, Mod TR, PASP 54, mildly decreased RV systolic function.  - Hyperkalemia with spironolactone.  - LHC/RHC (12/17): No CAD, mean RA 2, PA 39/13 mean 23, mean PCWP 7, CI 2.1.  - Echo (3/18): EF 55%, mild LVH.  - Echo (7/20): EF 55-60%, normal RV.  3. ABIs 01/05/2016 normal   4. COPD: Active smoker.  5. Type II diabetes 6. HTN 7. Chronic pain syndrome 8. CKD stage 3: Suspect diabetic nephropathy.   Labs (9/17): SPEP negative, HIV negative, K 4.4, creatinine 1.22 Labs (10/17): LDL 111, HDL 32, TSH normal, K 5.2, creatinine 1.49, digoxin 0.7, BNP 245 Labs (12/17): K 4.4, creatinine 1.46 => 1.5, digoxin 0.9 Labs (1/18): K 4.6, creatinine 1.37, digoxin 1.3, LDL 83 Labs (9/18): K 4.9, creatinine 1.28  Labs (11/19): K 5.2, creatinine 1.5 Labs (4/20): LDL 91, K 5, creatinine 1.59  Current Outpatient Medications  Medication Sig Dispense Refill  . atorvastatin (LIPITOR) 80 MG tablet TAKE 1 TABLET BY MOUTH ONCE DAILY AT  6PM 90 tablet 0  . budesonide-formoterol (SYMBICORT) 160-4.5 MCG/ACT inhaler Inhale 1 puff into the lungs 2 (two)  times daily. 3 Inhaler 4  . carvedilol (COREG) 12.5 MG tablet TAKE 1 TABLET BY MOUTH TWICE DAILY WITH A MEAL 180 tablet 0  . cyclobenzaprine (FLEXERIL) 10 MG tablet Take 1 tablet (10 mg total) by mouth 3 (three) times daily as needed. for muscle spams 270 tablet 1  . diazepam (VALIUM) 5 MG tablet TAKE 1 TABLET BY MOUTH AT BEDTIME AS NEEDED AND  MAY REPEAT DOSE ONE TIME IF NEEDED FOR MUSCLE SPASMS OR SEDATION. NEED VISIT 180 tablet 0  . furosemide (LASIX) 40 MG tablet Take 1 tab po every Mon, Wed, Fri, and Sat 50 tablet 3  . gabapentin (NEURONTIN) 400 MG capsule Take  1 capsule (400 mg total) by mouth 3 (three) times daily. 270 capsule 1  . glimepiride (AMARYL) 4 MG tablet Take 1 tablet (4 mg total) by mouth 2 (two) times daily. 180 tablet 0  . HYDROcodone-acetaminophen (NORCO) 7.5-325 MG tablet Take 1 tablet by mouth every 8 (eight) hours as needed for moderate pain. 90 tablet 0  . Insulin Glargine (LANTUS SOLOSTAR) 100 UNIT/ML Solostar Pen Inject 15 Units into the skin daily at 10 pm. 5 pen 1  . Insulin Pen Needle (PEN NEEDLES) 32G X 4 MM MISC 1 Units by Does not apply route daily. 30 each 1  . ipratropium (ATROVENT HFA) 17 MCG/ACT inhaler Inhale 2 puffs into the lungs every 4 (four) hours as needed for wheezing. 3 Inhaler 0  . Ipratropium-Albuterol (COMBIVENT) 20-100 MCG/ACT AERS respimat Inhale 1 puff into the lungs every 6 (six) hours. 3 Inhaler 0  . Krill Oil CAPS Take 1 capsule by mouth daily.     . metFORMIN (GLUCOPHAGE) 850 MG tablet TAKE 1 TABLET BY MOUTH TWICE DAILY WITH A MEAL 90 tablet 1  . rivaroxaban (XARELTO) 20 MG TABS tablet Take 1 tablet (20 mg total) by mouth daily. 90 tablet 0  . sacubitril-valsartan (ENTRESTO) 49-51 MG Take 1 tablet by mouth 2 (two) times daily. 180 tablet 3  . tiotropium (SPIRIVA HANDIHALER) 18 MCG inhalation capsule Place 1 capsule (18 mcg total) into inhaler and inhale daily. 90 capsule 1  . traMADol (ULTRAM) 50 MG tablet Take 2 tablets (100 mg total) by mouth every 6 (six) hours as needed for moderate pain. 240 tablet 1  . betamethasone, augmented, (DIPROLENE) 0.05 % lotion Apply topically 2 (two) times daily. After completing up to 2 weeks of the halobetasol.  Then use at first sign of ecurrence (Patient not taking: Reported on 11/14/2018) 60 mL 4  . halobetasol (ULTRAVATE) 0.05 % cream Apply topically 2 (two) times daily. For up to 2 weeks (Patient not taking: Reported on 11/14/2018) 50 g 0  . mupirocin ointment (BACTROBAN) 2 % Apply 1 application 4 (four) times daily topically. (Patient not taking: Reported on 11/14/2018)  30 g 1  . ranitidine (ZANTAC) 150 MG tablet Take 1 tablet (150 mg total) by mouth 2 (two) times daily. (Patient not taking: Reported on 11/14/2018) 180 tablet 3   No current facility-administered medications for this encounter.     Allergies:   Albuterol and Morphine and related   Social History:  The patient  reports that she has been smoking cigarettes. She has a 45.00 pack-year smoking history. She has never used smokeless tobacco. She reports current alcohol use. She reports that she does not use drugs.   Family History:  The patient's family history includes COPD in her paternal grandfather; Cancer in her maternal grandmother; Colon polyps in her father; Diabetes in her  brother, father, mother, and paternal grandmother; Heart disease in her father, paternal grandfather, and paternal grandmother; Heart murmur in her mother; Hypertension in her brother, brother, and mother.   ROS:  Please see the history of present illness.   All other systems are personally reviewed and negative.   Exam:   BP (!) 151/78   Pulse 68   Wt 115.1 kg (253 lb 12.8 oz)   SpO2 94%   BMI 41.28 kg/m  General: NAD Neck: No JVD, no thyromegaly or thyroid nodule.  Lungs: Clear to auscultation bilaterally with normal respiratory effort. CV: Nondisplaced PMI.  Heart regular S1/S2, no S3/S4, no murmur.  No peripheral edema.  No carotid bruit.  Normal pedal pulses.  Abdomen: Soft, nontender, no hepatosplenomegaly, no distention.  Skin: Intact without lesions or rashes.  Neurologic: Alert and oriented x 3.  Psych: Normal affect. Extremities: No clubbing or cyanosis.  HEENT: Normal.    Recent Labs: 03/10/2018: ALT 5; TSH 4.390 08/22/2018: Hemoglobin 10.4; Platelets 319 11/14/2018: BUN 17; Creatinine, Ser 1.41; Potassium 5.1; Sodium 132  Personally reviewed   Wt Readings from Last 3 Encounters:  11/14/18 115.1 kg (253 lb 12.8 oz)  03/10/18 111.7 kg (246 lb 3.2 oz)  12/27/17 108.4 kg (239 lb)      ASSESSMENT  AND PLAN:  1. H/O PE: Submassive on CT. Etiology uncertain: no known cancer, long trip, recent surgery.  Possibly related to stasis in the setting of cardiomyopathy with severely decreased systolic function.  - Given cardiomyopathy and submassive PE with no certain trigger, think she will need long-term anticoagulation. She will continue Xarelto.  2. Chronic systolic => diastolic CHF: Nonischemic cardiomyopathy.  ECHO 12/2015 EF 15-20% with diffuse hypokinesis. Uncertain etiology. No chest pain. Symptoms began as orthopnea around 6/17, then developed progressive exertional dyspnea. ECG shows anteroseptal MI but no change from 2013 ECG. Cardiac MRI did not show LGE. HIV negative, thyroid indices ok (mild TSH elevation but normal T3/T4), SPEP negative.  12/17 RHC/LHC showed no angiographic CAD, optimized filling pressures, low but not markedly low cardiac output.  Echo 3/18 showed recovery of LV function with EF up to 55%, echo was done today and reviewed, showing that EF remains 55-60%.  On exam today, she does not look volume overloaded.  NYHA class II symptoms.  BP usually is well-controlled, so I will not change her meds today.  - Continue Coreg 12.5 mg bid.   - Continue Entresto 49/51 bid.   - Off spironolactone with persistent hyperkalemia.   - Continue Lasix 40 every other day. Repeat BMET today. 3. CKD: Stage III.  BMET today.  4. Active smoker: Counseled to quit. She failed Chantix.   5. Hyperlipidemia: Adequate lipids in 4/20, taking atorvastatin.  6. Obesity: Weight is up, think caloric rather than CHF.  Encouraged her to work on diet and exercise.   Signed, Marca Anconaalton Brittnye Josephs, MD  11/14/2018  [facility] [address] [facility] [place] [id number] [phone] (office) [phone] (fax)

## 2018-11-15 ENCOUNTER — Encounter: Payer: Self-pay | Admitting: Registered Nurse

## 2018-11-15 ENCOUNTER — Encounter (HOSPITAL_COMMUNITY): Payer: Self-pay

## 2018-11-15 ENCOUNTER — Telehealth: Payer: Self-pay

## 2018-11-15 NOTE — Telephone Encounter (Signed)
Pt states she needs a refill for her pain medication until her appt with Haeg on 07/29. Pt states that she will be out on 07/19. I spoke with Haeg to confirm pt's appt and I was told that pt made the appt on 07/16. Please advise.

## 2018-11-16 ENCOUNTER — Other Ambulatory Visit: Payer: Self-pay | Admitting: Registered Nurse

## 2018-11-16 DIAGNOSIS — G894 Chronic pain syndrome: Secondary | ICD-10-CM

## 2018-11-16 MED ORDER — HYDROCODONE-ACETAMINOPHEN 7.5-325 MG PO TABS: 1.0000 | ORAL_TABLET | Freq: Three times a day (TID) | ORAL | 0 refills | Status: AC | PRN

## 2018-11-16 NOTE — Progress Notes (Signed)
Pt has appt scheduled with pain medicine on 11/28/18. Refills sent to get her through to that appointment. Unfortunately I will no longer refill her Norco at this office as we have discussed that managing long term opioid use is more appropriate for pain management.   Kathrin Ruddy, NP

## 2018-11-16 NOTE — Telephone Encounter (Signed)
Pt called and would like her  furosemide (LASIX) 40 MG tablet [[id number]] refilled a well , [facility] [address]

## 2018-11-19 ENCOUNTER — Encounter: Payer: Self-pay | Admitting: Registered Nurse

## 2018-11-19 ENCOUNTER — Other Ambulatory Visit: Payer: Self-pay | Admitting: Registered Nurse

## 2018-11-19 DIAGNOSIS — G894 Chronic pain syndrome: Secondary | ICD-10-CM

## 2018-11-21 NOTE — Telephone Encounter (Signed)
Requested medication (s) are due for refill today: Yes  Requested medication (s) are on the active medication list: Yes  Last refill:  09/17/18  Future visit scheduled: No  Notes to clinic:  See request    Requested Prescriptions  Pending Prescriptions Disp Refills   traMADol (ULTRAM) 50 MG tablet [Pharmacy Med Name: traMADol HCl 50 MG Oral Tablet] 240 tablet 0    Sig: TAKE 2 TABLETS BY MOUTH EVERY 6 HOURS AS NEEDED FOR MODERATE PAIN     Not Delegated - Analgesics:  Opioid Agonists Failed - 11/19/2018 12:02 PM      Failed - This refill cannot be delegated      Failed - Urine Drug Screen completed in last 360 days.      Failed - Valid encounter within last 6 months    Recent Outpatient Visits          8 months ago Type 2 diabetes mellitus without complication, without long-term current use of insulin [facility])   [facility], Laurey Arrow, MD   10 months ago Primary osteoarthritis of left knee   [facility], Laurey Arrow, MD   1 year ago Encounter for screening mammogram for breast cancer   [facility], Laurey Arrow, MD   1 year ago Medication monitoring encounter   Primary [facility] at [facility], Laurey Arrow, MD   1 year ago Type 2 diabetes mellitus with stage 3 chronic kidney disease, without long-term current use of insulin [facility])   [facility], Laurey Arrow, MD

## 2018-11-22 ENCOUNTER — Other Ambulatory Visit: Payer: Self-pay

## 2018-11-22 MED ORDER — FUROSEMIDE 40 MG PO TABS: ORAL_TABLET | ORAL | 1 refills | Status: DC

## 2018-11-23 ENCOUNTER — Other Ambulatory Visit: Payer: Self-pay | Admitting: Registered Nurse

## 2018-11-23 DIAGNOSIS — N183 Chronic kidney disease, stage 3 unspecified: Secondary | ICD-10-CM

## 2018-11-23 MED ORDER — FUROSEMIDE 40 MG PO TABS: ORAL_TABLET | ORAL | 1 refills | Status: DC

## 2018-11-23 NOTE — Progress Notes (Signed)
Refill sent for furosemide  Kathrin Ruddy, NP

## 2018-11-25 NOTE — Telephone Encounter (Signed)
Medication has been refilled.

## 2018-11-27 ENCOUNTER — Other Ambulatory Visit (HOSPITAL_COMMUNITY): Payer: Self-pay | Admitting: Cardiology

## 2018-11-28 DIAGNOSIS — G8929 Other chronic pain: Secondary | ICD-10-CM | POA: Diagnosis not present

## 2018-11-28 DIAGNOSIS — M25562 Pain in left knee: Secondary | ICD-10-CM | POA: Diagnosis not present

## 2018-11-28 DIAGNOSIS — G894 Chronic pain syndrome: Secondary | ICD-10-CM | POA: Diagnosis not present

## 2018-11-28 DIAGNOSIS — Z79891 Long term (current) use of opiate analgesic: Secondary | ICD-10-CM | POA: Diagnosis not present

## 2018-11-28 DIAGNOSIS — F1120 Opioid dependence, uncomplicated: Secondary | ICD-10-CM | POA: Diagnosis not present

## 2018-11-28 DIAGNOSIS — M961 Postlaminectomy syndrome, not elsewhere classified: Secondary | ICD-10-CM | POA: Diagnosis not present

## 2018-11-28 DIAGNOSIS — M545 Low back pain: Secondary | ICD-10-CM | POA: Diagnosis not present

## 2018-12-04 ENCOUNTER — Other Ambulatory Visit (HOSPITAL_COMMUNITY): Payer: Self-pay | Admitting: *Deleted

## 2018-12-04 MED ORDER — CARVEDILOL 12.5 MG PO TABS: ORAL_TABLET | ORAL | 0 refills | Status: DC

## 2018-12-04 MED ORDER — RIVAROXABAN 20 MG PO TABS: 20.0000 mg | ORAL_TABLET | Freq: Every day | ORAL | 0 refills | Status: DC

## 2018-12-05 DIAGNOSIS — E119 Type 2 diabetes mellitus without complications: Secondary | ICD-10-CM | POA: Diagnosis not present

## 2018-12-05 DIAGNOSIS — I110 Hypertensive heart disease with heart failure: Secondary | ICD-10-CM | POA: Diagnosis not present

## 2018-12-05 DIAGNOSIS — I2609 Other pulmonary embolism with acute cor pulmonale: Secondary | ICD-10-CM | POA: Diagnosis not present

## 2018-12-05 DIAGNOSIS — E785 Hyperlipidemia, unspecified: Secondary | ICD-10-CM | POA: Diagnosis not present

## 2018-12-05 DIAGNOSIS — I429 Cardiomyopathy, unspecified: Secondary | ICD-10-CM | POA: Diagnosis not present

## 2018-12-05 DIAGNOSIS — G894 Chronic pain syndrome: Secondary | ICD-10-CM | POA: Diagnosis not present

## 2018-12-05 DIAGNOSIS — Z79899 Other long term (current) drug therapy: Secondary | ICD-10-CM | POA: Diagnosis not present

## 2018-12-05 DIAGNOSIS — I10 Essential (primary) hypertension: Secondary | ICD-10-CM | POA: Diagnosis not present

## 2018-12-05 DIAGNOSIS — I5042 Chronic combined systolic (congestive) and diastolic (congestive) heart failure: Secondary | ICD-10-CM | POA: Diagnosis not present

## 2018-12-05 DIAGNOSIS — E11610 Type 2 diabetes mellitus with diabetic neuropathic arthropathy: Secondary | ICD-10-CM | POA: Diagnosis not present

## 2018-12-05 DIAGNOSIS — F17218 Nicotine dependence, cigarettes, with other nicotine-induced disorders: Secondary | ICD-10-CM | POA: Diagnosis not present

## 2018-12-05 DIAGNOSIS — N183 Chronic kidney disease, stage 3 (moderate): Secondary | ICD-10-CM | POA: Diagnosis not present

## 2018-12-05 DIAGNOSIS — M1711 Unilateral primary osteoarthritis, right knee: Secondary | ICD-10-CM | POA: Diagnosis not present

## 2018-12-05 DIAGNOSIS — D519 Vitamin B12 deficiency anemia, unspecified: Secondary | ICD-10-CM | POA: Diagnosis not present

## 2018-12-05 DIAGNOSIS — J449 Chronic obstructive pulmonary disease, unspecified: Secondary | ICD-10-CM | POA: Diagnosis not present

## 2018-12-06 DIAGNOSIS — J449 Chronic obstructive pulmonary disease, unspecified: Secondary | ICD-10-CM | POA: Diagnosis not present

## 2018-12-07 DIAGNOSIS — J449 Chronic obstructive pulmonary disease, unspecified: Secondary | ICD-10-CM | POA: Diagnosis not present

## 2019-01-07 DIAGNOSIS — J449 Chronic obstructive pulmonary disease, unspecified: Secondary | ICD-10-CM | POA: Diagnosis not present

## 2019-01-09 ENCOUNTER — Encounter: Payer: Self-pay | Admitting: *Deleted

## 2019-01-25 DIAGNOSIS — Z20828 Contact with and (suspected) exposure to other viral communicable diseases: Secondary | ICD-10-CM | POA: Diagnosis not present

## 2019-01-25 DIAGNOSIS — Z2089 Contact with and (suspected) exposure to other communicable diseases: Secondary | ICD-10-CM | POA: Diagnosis not present

## 2019-02-06 DIAGNOSIS — J449 Chronic obstructive pulmonary disease, unspecified: Secondary | ICD-10-CM | POA: Diagnosis not present

## 2019-03-09 ENCOUNTER — Other Ambulatory Visit (HOSPITAL_COMMUNITY): Payer: Self-pay | Admitting: Cardiology

## 2019-03-26 DIAGNOSIS — F17218 Nicotine dependence, cigarettes, with other nicotine-induced disorders: Secondary | ICD-10-CM | POA: Diagnosis not present

## 2019-03-26 DIAGNOSIS — I10 Essential (primary) hypertension: Secondary | ICD-10-CM | POA: Diagnosis not present

## 2019-03-26 DIAGNOSIS — D519 Vitamin B12 deficiency anemia, unspecified: Secondary | ICD-10-CM | POA: Diagnosis not present

## 2019-03-26 DIAGNOSIS — E11610 Type 2 diabetes mellitus with diabetic neuropathic arthropathy: Secondary | ICD-10-CM | POA: Diagnosis not present

## 2019-03-26 DIAGNOSIS — E785 Hyperlipidemia, unspecified: Secondary | ICD-10-CM | POA: Diagnosis not present

## 2019-03-26 DIAGNOSIS — I2609 Other pulmonary embolism with acute cor pulmonale: Secondary | ICD-10-CM | POA: Diagnosis not present

## 2019-03-26 DIAGNOSIS — G894 Chronic pain syndrome: Secondary | ICD-10-CM | POA: Diagnosis not present

## 2019-03-26 DIAGNOSIS — J449 Chronic obstructive pulmonary disease, unspecified: Secondary | ICD-10-CM | POA: Diagnosis not present

## 2019-03-26 DIAGNOSIS — E119 Type 2 diabetes mellitus without complications: Secondary | ICD-10-CM | POA: Diagnosis not present

## 2019-04-22 ENCOUNTER — Other Ambulatory Visit (HOSPITAL_COMMUNITY): Payer: Self-pay | Admitting: Cardiology

## 2019-04-22 ENCOUNTER — Other Ambulatory Visit: Payer: Self-pay | Admitting: Registered Nurse

## 2019-04-22 DIAGNOSIS — N183 Chronic kidney disease, stage 3 unspecified: Secondary | ICD-10-CM

## 2019-04-22 NOTE — Telephone Encounter (Signed)
Requested medication (s) are due for refill today: yes  Requested medication (s) are on the active medication list: yes  Last refill:  02/11/2019  Future visit scheduled:no  Notes to clinic:  overdue for follow up Review for refill   Requested Prescriptions  Pending Prescriptions Disp Refills   furosemide (LASIX) 40 MG tablet [Pharmacy Med Name: Furosemide 40 MG Oral Tablet] 50 tablet 0    Sig: TAKE 1 TABLET BY MOUTH EVERY MONDAY, WEDNESDAY, FRIDAY, AND SATURDAY      Cardiovascular:  Diuretics - Loop Failed - 04/22/2019  5:31 AM      Failed - Na in normal range and within 360 days    Sodium  Date Value Ref Range Status  11/14/2018 132 (L) 135 - 145 mmol/L Final  03/10/2018 142 134 - 144 mmol/L Final          Failed - Cr in normal range and within 360 days    Creat  Date Value Ref Range Status  02/04/2016 1.49 (H) 0.50 - 1.05 mg/dL Final    Comment:      For patients > or = 61 years of age: The upper reference limit for Creatinine is approximately 13% higher for people identified as African-American.      Creatinine, Ser  Date Value Ref Range Status  11/14/2018 1.41 (H) 0.44 - 1.00 mg/dL Final          Failed - Last BP in normal range    BP Readings from Last 1 Encounters:  11/14/18 (!) 151/78          Failed - Valid encounter within last 6 months    Recent Outpatient Visits           7 months ago Encounter to establish care   [facility], [place], NP   1 year ago Type 2 diabetes mellitus without complication, without long-term current use of insulin [facility])   [facility], Laurey Arrow, MD   1 year ago Primary osteoarthritis of left knee   [facility], Laurey Arrow, MD   1 year ago Encounter for screening mammogram for breast cancer   [facility], Laurey Arrow, MD   1 year ago Medication monitoring encounter   [facility], Laurey Arrow, MD              Passed - K in normal range and within 360  days    Potassium  Date Value Ref Range Status  11/14/2018 5.1 3.5 - 5.1 mmol/L Final          Passed - Ca in normal range and within 360 days    Calcium  Date Value Ref Range Status  11/14/2018 9.3 8.9 - 10.3 mg/dL Final

## 2019-06-10 ENCOUNTER — Other Ambulatory Visit (HOSPITAL_COMMUNITY): Payer: Self-pay | Admitting: Cardiology

## 2019-08-05 DIAGNOSIS — I5042 Chronic combined systolic (congestive) and diastolic (congestive) heart failure: Secondary | ICD-10-CM | POA: Diagnosis not present

## 2019-08-05 DIAGNOSIS — I110 Hypertensive heart disease with heart failure: Secondary | ICD-10-CM | POA: Diagnosis not present

## 2019-08-05 DIAGNOSIS — I429 Cardiomyopathy, unspecified: Secondary | ICD-10-CM | POA: Diagnosis not present

## 2019-08-05 DIAGNOSIS — S96211A Strain of intrinsic muscle and tendon at ankle and foot level, right foot, initial encounter: Secondary | ICD-10-CM | POA: Diagnosis not present

## 2019-08-17 ENCOUNTER — Other Ambulatory Visit (HOSPITAL_COMMUNITY): Payer: Self-pay | Admitting: Cardiology

## 2019-09-01 ENCOUNTER — Other Ambulatory Visit (HOSPITAL_COMMUNITY): Payer: Self-pay | Admitting: Cardiology

## 2019-09-26 ENCOUNTER — Other Ambulatory Visit (HOSPITAL_COMMUNITY): Payer: Self-pay | Admitting: Cardiology

## 2019-10-18 DIAGNOSIS — I5042 Chronic combined systolic (congestive) and diastolic (congestive) heart failure: Secondary | ICD-10-CM | POA: Diagnosis not present

## 2019-10-18 DIAGNOSIS — E11610 Type 2 diabetes mellitus with diabetic neuropathic arthropathy: Secondary | ICD-10-CM | POA: Diagnosis not present

## 2019-10-18 DIAGNOSIS — J449 Chronic obstructive pulmonary disease, unspecified: Secondary | ICD-10-CM | POA: Diagnosis not present

## 2019-10-18 DIAGNOSIS — I110 Hypertensive heart disease with heart failure: Secondary | ICD-10-CM | POA: Diagnosis not present

## 2019-10-18 DIAGNOSIS — Z79899 Other long term (current) drug therapy: Secondary | ICD-10-CM | POA: Diagnosis not present

## 2019-10-18 DIAGNOSIS — I429 Cardiomyopathy, unspecified: Secondary | ICD-10-CM | POA: Diagnosis not present

## 2019-10-18 DIAGNOSIS — G894 Chronic pain syndrome: Secondary | ICD-10-CM | POA: Diagnosis not present

## 2019-10-23 ENCOUNTER — Other Ambulatory Visit: Payer: Self-pay | Admitting: Registered Nurse

## 2019-10-23 ENCOUNTER — Other Ambulatory Visit (HOSPITAL_COMMUNITY): Payer: Self-pay | Admitting: Cardiology

## 2019-10-23 DIAGNOSIS — E119 Type 2 diabetes mellitus without complications: Secondary | ICD-10-CM

## 2019-11-19 DIAGNOSIS — J449 Chronic obstructive pulmonary disease, unspecified: Secondary | ICD-10-CM | POA: Diagnosis not present

## 2019-12-05 ENCOUNTER — Inpatient Hospital Stay (HOSPITAL_COMMUNITY)
Admission: EM | Admit: 2019-12-05 | Discharge: 2019-12-11 | DRG: 308 | Disposition: A | Discharge status: Home / Self Care | Payer: Medicare Other | Attending: Student in an Organized Health Care Education/Training Program | Admitting: Student in an Organized Health Care Education/Training Program

## 2019-12-05 ENCOUNTER — Emergency Department (HOSPITAL_COMMUNITY): Payer: Medicare Other

## 2019-12-05 ENCOUNTER — Encounter (HOSPITAL_COMMUNITY): Payer: Self-pay | Admitting: *Deleted

## 2019-12-05 ENCOUNTER — Other Ambulatory Visit: Payer: Self-pay

## 2019-12-05 DIAGNOSIS — E1129 Type 2 diabetes mellitus with other diabetic kidney complication: Secondary | ICD-10-CM

## 2019-12-05 DIAGNOSIS — M1711 Unilateral primary osteoarthritis, right knee: Secondary | ICD-10-CM | POA: Diagnosis present

## 2019-12-05 DIAGNOSIS — I358 Other nonrheumatic aortic valve disorders: Secondary | ICD-10-CM | POA: Diagnosis present

## 2019-12-05 DIAGNOSIS — I483 Typical atrial flutter: Principal | ICD-10-CM | POA: Diagnosis present

## 2019-12-05 DIAGNOSIS — Z833 Family history of diabetes mellitus: Secondary | ICD-10-CM | POA: Diagnosis not present

## 2019-12-05 DIAGNOSIS — Z7951 Long term (current) use of inhaled steroids: Secondary | ICD-10-CM

## 2019-12-05 DIAGNOSIS — I2609 Other pulmonary embolism with acute cor pulmonale: Secondary | ICD-10-CM | POA: Diagnosis not present

## 2019-12-05 DIAGNOSIS — I10 Essential (primary) hypertension: Secondary | ICD-10-CM | POA: Diagnosis present

## 2019-12-05 DIAGNOSIS — R002 Palpitations: Secondary | ICD-10-CM | POA: Diagnosis present

## 2019-12-05 DIAGNOSIS — I130 Hypertensive heart and chronic kidney disease with heart failure and stage 1 through stage 4 chronic kidney disease, or unspecified chronic kidney disease: Secondary | ICD-10-CM | POA: Diagnosis not present

## 2019-12-05 DIAGNOSIS — N1831 Chronic kidney disease, stage 3a: Secondary | ICD-10-CM | POA: Diagnosis present

## 2019-12-05 DIAGNOSIS — Z8371 Family history of colonic polyps: Secondary | ICD-10-CM | POA: Diagnosis not present

## 2019-12-05 DIAGNOSIS — Z825 Family history of asthma and other chronic lower respiratory diseases: Secondary | ICD-10-CM | POA: Diagnosis not present

## 2019-12-05 DIAGNOSIS — E1159 Type 2 diabetes mellitus with other circulatory complications: Secondary | ICD-10-CM | POA: Diagnosis present

## 2019-12-05 DIAGNOSIS — J449 Chronic obstructive pulmonary disease, unspecified: Secondary | ICD-10-CM | POA: Diagnosis not present

## 2019-12-05 DIAGNOSIS — Z885 Allergy status to narcotic agent status: Secondary | ICD-10-CM

## 2019-12-05 DIAGNOSIS — I5022 Chronic systolic (congestive) heart failure: Secondary | ICD-10-CM | POA: Diagnosis not present

## 2019-12-05 DIAGNOSIS — I4892 Unspecified atrial flutter: Secondary | ICD-10-CM | POA: Diagnosis not present

## 2019-12-05 DIAGNOSIS — Z8249 Family history of ischemic heart disease and other diseases of the circulatory system: Secondary | ICD-10-CM

## 2019-12-05 DIAGNOSIS — E1122 Type 2 diabetes mellitus with diabetic chronic kidney disease: Secondary | ICD-10-CM | POA: Diagnosis present

## 2019-12-05 DIAGNOSIS — B974 Respiratory syncytial virus as the cause of diseases classified elsewhere: Secondary | ICD-10-CM | POA: Diagnosis not present

## 2019-12-05 DIAGNOSIS — E119 Type 2 diabetes mellitus without complications: Secondary | ICD-10-CM

## 2019-12-05 DIAGNOSIS — R000 Tachycardia, unspecified: Secondary | ICD-10-CM | POA: Diagnosis present

## 2019-12-05 DIAGNOSIS — F17210 Nicotine dependence, cigarettes, uncomplicated: Secondary | ICD-10-CM | POA: Diagnosis not present

## 2019-12-05 DIAGNOSIS — Z888 Allergy status to other drugs, medicaments and biological substances status: Secondary | ICD-10-CM

## 2019-12-05 DIAGNOSIS — Z96652 Presence of left artificial knee joint: Secondary | ICD-10-CM | POA: Diagnosis present

## 2019-12-05 DIAGNOSIS — U071 COVID-19: Secondary | ICD-10-CM | POA: Diagnosis present

## 2019-12-05 DIAGNOSIS — G894 Chronic pain syndrome: Secondary | ICD-10-CM | POA: Diagnosis present

## 2019-12-05 DIAGNOSIS — E785 Hyperlipidemia, unspecified: Secondary | ICD-10-CM | POA: Diagnosis present

## 2019-12-05 DIAGNOSIS — Z6841 Body Mass Index (BMI) 40.0 and over, adult: Secondary | ICD-10-CM

## 2019-12-05 DIAGNOSIS — I4891 Unspecified atrial fibrillation: Secondary | ICD-10-CM | POA: Diagnosis not present

## 2019-12-05 DIAGNOSIS — I428 Other cardiomyopathies: Secondary | ICD-10-CM | POA: Diagnosis not present

## 2019-12-05 DIAGNOSIS — D72829 Elevated white blood cell count, unspecified: Secondary | ICD-10-CM | POA: Diagnosis not present

## 2019-12-05 DIAGNOSIS — R778 Other specified abnormalities of plasma proteins: Secondary | ICD-10-CM | POA: Diagnosis present

## 2019-12-05 DIAGNOSIS — Z794 Long term (current) use of insulin: Secondary | ICD-10-CM

## 2019-12-05 DIAGNOSIS — R0682 Tachypnea, not elsewhere classified: Secondary | ICD-10-CM | POA: Diagnosis present

## 2019-12-05 DIAGNOSIS — Z7901 Long term (current) use of anticoagulants: Secondary | ICD-10-CM

## 2019-12-05 DIAGNOSIS — R0602 Shortness of breath: Secondary | ICD-10-CM | POA: Diagnosis not present

## 2019-12-05 DIAGNOSIS — Z79899 Other long term (current) drug therapy: Secondary | ICD-10-CM

## 2019-12-05 DIAGNOSIS — K219 Gastro-esophageal reflux disease without esophagitis: Secondary | ICD-10-CM | POA: Diagnosis present

## 2019-12-05 LAB — HEPATIC FUNCTION PANEL
ALT: 9 U/L (ref 0–44)
AST: 15 U/L (ref 15–41)
Albumin: 2.9 g/dL — ABNORMAL LOW (ref 3.5–5.0)
Alkaline Phosphatase: 78 U/L (ref 38–126)
Bilirubin, Direct: 0.1 mg/dL (ref 0.0–0.2)
Indirect Bilirubin: 0.6 mg/dL (ref 0.3–0.9)
Total Bilirubin: 0.7 mg/dL (ref 0.3–1.2)
Total Protein: 7.7 g/dL (ref 6.5–8.1)

## 2019-12-05 LAB — BASIC METABOLIC PANEL
Anion gap: 14 (ref 5–15)
BUN: 23 mg/dL (ref 8–23)
CO2: 21 mmol/L — ABNORMAL LOW (ref 22–32)
Calcium: 9.2 mg/dL (ref 8.9–10.3)
Chloride: 96 mmol/L — ABNORMAL LOW (ref 98–111)
Creatinine, Ser: 1.58 mg/dL — ABNORMAL HIGH (ref 0.44–1.00)
GFR calc Af Amer: 41 mL/min — ABNORMAL LOW (ref >60)
GFR calc non Af Amer: 35 mL/min — ABNORMAL LOW (ref >60)
Glucose, Bld: 297 mg/dL — ABNORMAL HIGH (ref 70–99)
Potassium: 4.8 mmol/L (ref 3.5–5.1)
Sodium: 131 mmol/L — ABNORMAL LOW (ref 135–145)

## 2019-12-05 LAB — CBG MONITORING, ED
Glucose-Capillary: 291 mg/dL — ABNORMAL HIGH (ref 70–99)
Glucose-Capillary: 441 mg/dL — ABNORMAL HIGH (ref 70–99)

## 2019-12-05 LAB — HEMOGLOBIN A1C
Hgb A1c MFr Bld: 7.6 % — ABNORMAL HIGH (ref 4.8–5.6)
Mean Plasma Glucose: 171.42 mg/dL

## 2019-12-05 LAB — TRIGLYCERIDES: Triglycerides: 182 mg/dL — ABNORMAL HIGH (ref <150)

## 2019-12-05 LAB — FIBRINOGEN: Fibrinogen: 726 mg/dL — ABNORMAL HIGH (ref 210–475)

## 2019-12-05 LAB — CBC
HCT: 37.6 % (ref 36.0–46.0)
Hemoglobin: 11.6 g/dL — ABNORMAL LOW (ref 12.0–15.0)
MCH: 25.6 pg — ABNORMAL LOW (ref 26.0–34.0)
MCHC: 30.9 g/dL (ref 30.0–36.0)
MCV: 82.8 fL (ref 80.0–100.0)
Platelets: 640 10*3/uL — ABNORMAL HIGH (ref 150–400)
RBC: 4.54 MIL/uL (ref 3.87–5.11)
RDW: 14.6 % (ref 11.5–15.5)
WBC: 12.5 10*3/uL — ABNORMAL HIGH (ref 4.0–10.5)
nRBC: 0 % (ref 0.0–0.2)

## 2019-12-05 LAB — FERRITIN: Ferritin: 67 ng/mL (ref 11–307)

## 2019-12-05 LAB — LACTATE DEHYDROGENASE: LDH: 155 U/L (ref 98–192)

## 2019-12-05 LAB — D-DIMER, QUANTITATIVE: D-Dimer, Quant: 1.75 ug/mL-FEU — ABNORMAL HIGH (ref 0.00–0.50)

## 2019-12-05 LAB — SARS CORONAVIRUS 2 BY RT PCR (HOSPITAL ORDER, PERFORMED IN ~~LOC~~ HOSPITAL LAB): SARS Coronavirus 2: POSITIVE — AB

## 2019-12-05 LAB — C-REACTIVE PROTEIN: CRP: 1.6 mg/dL — ABNORMAL HIGH (ref <1.0)

## 2019-12-05 LAB — TROPONIN I (HIGH SENSITIVITY)
Troponin I (High Sensitivity): 27 ng/L — ABNORMAL HIGH (ref <18)
Troponin I (High Sensitivity): 25 ng/L — ABNORMAL HIGH (ref <18)

## 2019-12-05 LAB — PROCALCITONIN: Procalcitonin: <0.1 ng/mL

## 2019-12-05 LAB — LACTIC ACID, PLASMA: Lactic Acid, Venous: 1.5 mmol/L (ref 0.5–1.9)

## 2019-12-05 LAB — TSH: TSH: 4.486 u[IU]/mL (ref 0.350–4.500)

## 2019-12-05 MED ORDER — SODIUM CHLORIDE 0.9% FLUSH
3.0000 mL | Freq: Once | INTRAVENOUS | Status: AC
  Administered 2019-12-06: 3 mL via INTRAVENOUS

## 2019-12-05 MED ORDER — HYDROCODONE-ACETAMINOPHEN 7.5-325 MG PO TABS
1.0000 | ORAL_TABLET | Freq: Three times a day (TID) | ORAL | Status: DC | PRN
  Administered 2019-12-06 – 2019-12-11 (×9): 1 via ORAL
  Filled 2019-12-05 (×10): qty 1

## 2019-12-05 MED ORDER — INSULIN ASPART 100 UNIT/ML ~~LOC~~ SOLN
0.0000 [IU] | Freq: Three times a day (TID) | SUBCUTANEOUS | Status: DC
  Administered 2019-12-06 (×2): 15 [IU] via SUBCUTANEOUS
  Administered 2019-12-06: 11 [IU] via SUBCUTANEOUS
  Administered 2019-12-07: 20 [IU] via SUBCUTANEOUS
  Administered 2019-12-07: 7 [IU] via SUBCUTANEOUS
  Administered 2019-12-07: 11 [IU] via SUBCUTANEOUS
  Administered 2019-12-08: 3 [IU] via SUBCUTANEOUS
  Administered 2019-12-08: 4 [IU] via SUBCUTANEOUS
  Administered 2019-12-08: 11 [IU] via SUBCUTANEOUS
  Administered 2019-12-09 (×2): 4 [IU] via SUBCUTANEOUS
  Administered 2019-12-09: 11 [IU] via SUBCUTANEOUS
  Administered 2019-12-10 (×2): 7 [IU] via SUBCUTANEOUS
  Administered 2019-12-11: 4 [IU] via SUBCUTANEOUS
  Administered 2019-12-11: 3 [IU] via SUBCUTANEOUS

## 2019-12-05 MED ORDER — POLYETHYLENE GLYCOL 3350 17 G PO PACK: 17.0000 g | PACK | Freq: Every day | ORAL | Status: DC | PRN

## 2019-12-05 MED ORDER — DILTIAZEM LOAD VIA INFUSION
10.0000 mg | Freq: Once | INTRAVENOUS | Status: AC
  Administered 2019-12-05: 10 mg via INTRAVENOUS
  Filled 2019-12-05: qty 10

## 2019-12-05 MED ORDER — INSULIN GLARGINE 100 UNIT/ML ~~LOC~~ SOLN
15.0000 [IU] | Freq: Every day | SUBCUTANEOUS | Status: DC
  Administered 2019-12-05 – 2019-12-06 (×2): 15 [IU] via SUBCUTANEOUS
  Filled 2019-12-05 (×3): qty 0.15

## 2019-12-05 MED ORDER — METOPROLOL TARTRATE 5 MG/5ML IV SOLN
5.0000 mg | Freq: Once | INTRAVENOUS | Status: AC
  Administered 2019-12-05: 5 mg via INTRAVENOUS
  Filled 2019-12-05: qty 5

## 2019-12-05 MED ORDER — CYCLOBENZAPRINE HCL 10 MG PO TABS
10.0000 mg | ORAL_TABLET | Freq: Three times a day (TID) | ORAL | Status: DC | PRN
  Administered 2019-12-08 – 2019-12-10 (×2): 10 mg via ORAL
  Filled 2019-12-05 (×2): qty 1

## 2019-12-05 MED ORDER — INSULIN GLARGINE 100 UNIT/ML ~~LOC~~ SOLN
10.0000 [IU] | Freq: Every day | SUBCUTANEOUS | Status: DC
  Filled 2019-12-05: qty 0.1

## 2019-12-05 MED ORDER — INSULIN ASPART 100 UNIT/ML ~~LOC~~ SOLN
0.0000 [IU] | Freq: Three times a day (TID) | SUBCUTANEOUS | Status: DC
  Administered 2019-12-05: 8 [IU] via SUBCUTANEOUS

## 2019-12-05 MED ORDER — RIVAROXABAN 20 MG PO TABS
20.0000 mg | ORAL_TABLET | Freq: Every day | ORAL | Status: DC
  Administered 2019-12-05 – 2019-12-11 (×7): 20 mg via ORAL
  Filled 2019-12-05 (×8): qty 1

## 2019-12-05 MED ORDER — INSULIN ASPART 100 UNIT/ML ~~LOC~~ SOLN
0.0000 [IU] | Freq: Every day | SUBCUTANEOUS | Status: DC
  Administered 2019-12-05 – 2019-12-06 (×2): 5 [IU] via SUBCUTANEOUS
  Administered 2019-12-07: 2 [IU] via SUBCUTANEOUS

## 2019-12-05 MED ORDER — DEXAMETHASONE SODIUM PHOSPHATE 10 MG/ML IJ SOLN
10.0000 mg | Freq: Once | INTRAMUSCULAR | Status: AC
  Administered 2019-12-05: 10 mg via INTRAVENOUS
  Filled 2019-12-05: qty 1

## 2019-12-05 MED ORDER — METOPROLOL TARTRATE 25 MG PO TABS
25.0000 mg | ORAL_TABLET | Freq: Two times a day (BID) | ORAL | Status: DC
  Administered 2019-12-05: 25 mg via ORAL
  Filled 2019-12-05: qty 1

## 2019-12-05 MED ORDER — DILTIAZEM HCL-DEXTROSE 125-5 MG/125ML-% IV SOLN (PREMIX)
0.0000 mg/h | INTRAVENOUS | Status: DC
  Administered 2019-12-05: 5 mg/h via INTRAVENOUS
  Administered 2019-12-05: 15 mg/h via INTRAVENOUS
  Administered 2019-12-06: 5 mg/h via INTRAVENOUS
  Filled 2019-12-05 (×3): qty 125

## 2019-12-05 MED ORDER — ATORVASTATIN CALCIUM 80 MG PO TABS
80.0000 mg | ORAL_TABLET | Freq: Every day | ORAL | Status: DC
  Administered 2019-12-05 – 2019-12-10 (×6): 80 mg via ORAL
  Filled 2019-12-05 (×6): qty 1

## 2019-12-05 MED ORDER — GABAPENTIN 300 MG PO CAPS
300.0000 mg | ORAL_CAPSULE | Freq: Three times a day (TID) | ORAL | Status: DC
  Administered 2019-12-05 – 2019-12-11 (×18): 300 mg via ORAL
  Filled 2019-12-05 (×18): qty 1

## 2019-12-05 MED ORDER — MOMETASONE FURO-FORMOTEROL FUM 200-5 MCG/ACT IN AERO
2.0000 | INHALATION_SPRAY | Freq: Two times a day (BID) | RESPIRATORY_TRACT | Status: DC
  Administered 2019-12-05 – 2019-12-11 (×12): 2 via RESPIRATORY_TRACT
  Filled 2019-12-05: qty 8.8

## 2019-12-05 NOTE — ED Provider Notes (Signed)
Emergency Department Provider Note   I have reviewed the triage vital signs and the nursing notes.   HISTORY  Chief Complaint Shortness of Breath   HPI Kaitlin Cantrell is a 64 y.o. female with past medical history reviewed below including prior PE on anticoagulation presents to the emergency department with generalized weakness, increased shortness of breath, heart palpitations.  Patient had known exposure to COVID-19 10 days ago when for family members tested positive.  She went out and got the [employer] vaccine but has felt increasingly weak and has developed shortness of breath over the past several days.  She denies chest pain.  She has had some subjective fever and body aches.  No radiation of symptoms or other modifying factors.   Past Medical History:  Diagnosis Date   Allergy    seasonal   Arthritis    Diabetes mellitus    GERD (gastroesophageal reflux disease)    History of pneumonia    15 years ago   Hyperlipidemia    Hypertension    PE (pulmonary embolism) 12/2015    Patient Active Problem List   Diagnosis Date Noted   Atrial flutter (HCC) 12/05/2019   COVID-19 12/05/2019   Bilateral lower extremity edema    Combined systolic and diastolic congestive heart failure (HCC)    Cardiomyopathy (HCC)    Pleural effusion    Pulmonary embolism with acute cor pulmonale (HCC) 12/29/2015   Arthritis 10/01/2015   Hyperlipidemia 10/01/2015   Chronic pain syndrome 10/01/2015   Chronic obstructive pulmonary disease (HCC) 10/01/2015   Nicotine dependence 10/01/2015   Type 2 diabetes mellitus without complication, without Kaitlin Cantrell-term current use of insulin (HCC) 03/30/2012   Polypharmacy 03/30/2012   Osteoarthritis of right knee 02/17/2012    Class: Diagnosis of   HTN (hypertension) 05/10/2011    Past Surgical History:  Procedure Laterality Date   ANKLE FRACTURE SURGERY  1974   left and pinned then pins removed   ANKLE GANGLION  CYST EXCISION  1994   right   BACK SURGERY  1998   discectomy and then fusion   CARDIAC CATHETERIZATION N/A 04/05/2016   Procedure: Right/Left Heart Cath and Coronary Angiography;  Surgeon: Laurey Morale, MD;  Location: [facility] INVASIVE CV LAB;  Service: Cardiovascular;  Laterality: N/A;   KNEE ARTHROPLASTY  02/14/2012   Procedure: COMPUTER ASSISTED TOTAL KNEE ARTHROPLASTY;  Surgeon: Cammy Copa, MD;  Location: [facility] OR;  Service: Orthopedics;  Laterality: Left;  Left total knee arthroplasty   TONSILLECTOMY  1972   TUBAL LIGATION  1996   UMBILICAL HERNIA REPAIR  1996    Allergies Albuterol and Morphine and related  Family History  Problem Relation Age of Onset   Colon polyps Father    Diabetes Father        mother   Heart disease Father    Heart murmur Mother    Diabetes Mother    Hypertension Mother    Diabetes Brother    Cancer Maternal Grandmother    Diabetes Paternal Grandmother    Heart disease Paternal Grandmother    COPD Paternal Grandfather    Heart disease Paternal Grandfather    Hypertension Brother    Hypertension Brother     Social History Social History   Tobacco Use   Smoking status: Current Every Day Smoker    Packs/day: 1.00    Years: 45.00    Pack years: 45.00    Types: Cigarettes   Smokeless tobacco: Never Used  Advertising account planner  Vaping Use: Never used  Substance Use Topics   Alcohol use: Yes    Comment: rarely   Drug use: No    Review of Systems  Constitutional: Positive subjective fever/chills Eyes: No visual changes. ENT: No sore throat. Cardiovascular: Denies chest pain. Positive palpitations.  Respiratory: Positive shortness of breath and cough.  Gastrointestinal: No abdominal pain.  Genitourinary: Negative for dysuria. Musculoskeletal: Negative for back pain. Positive body aches.  Skin: Negative for rash. Neurological: Negative for focal weakness or numbness. Positive HA.   10-point ROS otherwise  negative.  ____________________________________________   PHYSICAL EXAM:  VITAL SIGNS: ED Triage Vitals  Enc Vitals Group     BP 12/05/19 0609 (!) 158/117     Pulse Rate 12/05/19 0609 (!) 153     Resp 12/05/19 0609 20     Temp 12/05/19 0609 98.6 F (37 C)     Temp Source 12/05/19 0609 Oral     SpO2 12/05/19 0609 98 %   Constitutional: Alert and oriented. Well appearing and in no acute distress. Eyes: Conjunctivae are normal.  Head: Atraumatic. Nose: No congestion/rhinnorhea. Mouth/Throat: Mucous membranes are moist.  Neck: No stridor.   Cardiovascular: Normal rate, regular rhythm. Good peripheral circulation. Grossly normal heart sounds.   Respiratory: Slight increased respiratory effort.  No retractions. Lungs CTAB. Gastrointestinal: Soft and nontender. No distention.  Musculoskeletal: No gross deformities of extremities. Neurologic:  Normal speech and language Skin:  Skin is warm, dry and intact. No rash noted.  ____________________________________________   LABS (all labs ordered are listed, but only abnormal results are displayed)  Labs Reviewed  SARS CORONAVIRUS 2 BY RT PCR (HOSPITAL ORDER, PERFORMED IN [facility]) - Abnormal; Notable for the following components:      Result Value   SARS Coronavirus 2 POSITIVE (*)    All other components within normal limits  BASIC METABOLIC PANEL - Abnormal; Notable for the following components:   Sodium 131 (*)    Chloride 96 (*)    CO2 21 (*)    Glucose, Bld 297 (*)    Creatinine, Ser 1.58 (*)    GFR calc non Af Amer 35 (*)    GFR calc Af Amer 41 (*)    All other components within normal limits  CBC - Abnormal; Notable for the following components:   WBC 12.5 (*)    Hemoglobin 11.6 (*)    MCH 25.6 (*)    Platelets 640 (*)    All other components within normal limits  D-DIMER, QUANTITATIVE (NOT AT [facility]) - Abnormal; Notable for the following components:   D-Dimer, Quant 1.75 (*)    All other components  within normal limits  FIBRINOGEN - Abnormal; Notable for the following components:   Fibrinogen 726 (*)    All other components within normal limits  HEPATIC FUNCTION PANEL - Abnormal; Notable for the following components:   Albumin 2.9 (*)    All other components within normal limits  TRIGLYCERIDES - Abnormal; Notable for the following components:   Triglycerides 182 (*)    All other components within normal limits  C-REACTIVE PROTEIN - Abnormal; Notable for the following components:   CRP 1.6 (*)    All other components within normal limits  HEMOGLOBIN A1C - Abnormal; Notable for the following components:   Hgb A1c MFr Bld 7.6 (*)    All other components within normal limits  COMPREHENSIVE METABOLIC PANEL - Abnormal; Notable for the following components:   Sodium 129 (*)  Chloride 96 (*)    CO2 20 (*)    Glucose, Bld 328 (*)    BUN 29 (*)    Creatinine, Ser 1.33 (*)    Albumin 2.5 (*)    GFR calc non Af Amer 43 (*)    GFR calc Af Amer 50 (*)    All other components within normal limits  CBC - Abnormal; Notable for the following components:   WBC 14.2 (*)    Hemoglobin 11.3 (*)    MCH 25.3 (*)    Platelets 468 (*)    All other components within normal limits  D-DIMER, QUANTITATIVE (NOT AT [facility]) - Abnormal; Notable for the following components:   D-Dimer, Quant 1.40 (*)    All other components within normal limits  GLUCOSE, CAPILLARY - Abnormal; Notable for the following components:   Glucose-Capillary 342 (*)    All other components within normal limits  CBG MONITORING, ED - Abnormal; Notable for the following components:   Glucose-Capillary 291 (*)    All other components within normal limits  CBG MONITORING, ED - Abnormal; Notable for the following components:   Glucose-Capillary 441 (*)    All other components within normal limits  TROPONIN I (HIGH SENSITIVITY) - Abnormal; Notable for the following components:   Troponin I (High Sensitivity) 27 (*)    All other  components within normal limits  TROPONIN I (HIGH SENSITIVITY) - Abnormal; Notable for the following components:   Troponin I (High Sensitivity) 25 (*)    All other components within normal limits  CULTURE, BLOOD (ROUTINE X 2)  CULTURE, BLOOD (ROUTINE X 2)  MRSA PCR SCREENING  LACTIC ACID, PLASMA  PROCALCITONIN  LACTATE DEHYDROGENASE  FERRITIN  TSH  C-REACTIVE PROTEIN   ____________________________________________  EKG   EKG Interpretation  Date/Time:  Thursday December 05 2019 22:01:37 EDT Ventricular Rate:  103 PR Interval:  122 QRS Duration: 113 QT Interval:  377 QTC Calculation: 435 R Axis:   45 Text Interpretation: Atrial flutter Incomplete right bundle branch block Probable lateral infarct, age indeterminate Anterior infarct, old Confirmed by Benjiman Core [phone]) on 12/05/2019 10:52:11 PM       ____________________________________________  RADIOLOGY  DG Chest Port 1 View  Result Date: 12/05/2019 CLINICAL DATA:  Shortness of breath EXAM: PORTABLE CHEST 1 VIEW COMPARISON:  December 30, 2015 FINDINGS: Lungs are clear. Heart size and pulmonary vascularity are normal. No adenopathy. No bone lesions. IMPRESSION: Lungs clear.  Cardiac silhouette within normal limits. Electronically Signed   By: Bretta Bang III M.D.   On: 12/05/2019 08:12    ____________________________________________   PROCEDURES  Procedure(s) performed:   Procedures  CRITICAL CARE Performed by: Maia Plan Total critical care time: 35 minutes Critical care time was exclusive of separately billable procedures and treating other patients. Critical care was necessary to treat or prevent imminent or life-threatening deterioration. Critical care was time spent personally by me on the following activities: development of treatment plan with patient and/or surrogate as well as nursing, discussions with consultants, evaluation of patient's response to treatment, examination of patient, obtaining  history from patient or surrogate, ordering and performing treatments and interventions, ordering and review of laboratory studies, ordering and review of radiographic studies, pulse oximetry and re-evaluation of patient's condition.  Alona Bene, MD Emergency Medicine  ____________________________________________   INITIAL IMPRESSION / ASSESSMENT AND PLAN / ED COURSE  Pertinent labs & imaging results that were available during my care of the patient were reviewed by me and considered in  my medical decision making (see chart for details).   Patient presents with COVID-19-like symptoms after exposure last week.  She is essentially unvaccinated after receiving her [employer] vaccine immediately after known exposure to the virus.  Patient does have some tachypnea and on my initial evaluation appears to be in a-fib vs flutter. Will start diltiazem after review of the patient's most recent ECHO from 2020 showing EF 55-60%. CXR without infiltrate. Likely admit with increased WOB.   10:25 AM  Spoke with Cardiology regarding rate control issues. Patient is responding somewhat to Diltiazem now at 12.5 mg w/ /rate in the 130s down from 150s. They will consult. Will admit to medicine service.   11:45 AM   with Dr. Mayford Knife with cardiology.  Discussed the case and difficulty with rate control.  Recommends continuing diltiazem infusion and can give IV Lopressor for additional rate control.  Would have the medicine/hospitalist team admit the patient and they can call if they continue to have rate control issues for consult.   Discussed patient's case with IM teaching service to request admission. Patient and family (if present) updated with plan. Care transferred to medicine service.  I reviewed all nursing notes, vitals, pertinent old records, EKGs, labs, imaging (as available).  ____________________________________________  FINAL CLINICAL IMPRESSION(S) / ED DIAGNOSES  Final diagnoses:  COVID-19   Typical atrial flutter (HCC)     MEDICATIONS GIVEN DURING THIS VISIT:  Medications  sodium chloride flush (NS) 0.9 % injection 3 mL (3 mLs Intravenous Not Given 12/05/19 0955)  atorvastatin (LIPITOR) tablet 80 mg (80 mg Oral Given 12/05/19 1754)  rivaroxaban (XARELTO) tablet 20 mg (20 mg Oral Given 12/05/19 1437)  cyclobenzaprine (FLEXERIL) tablet 10 mg (has no administration in time range)  gabapentin (NEURONTIN) capsule 300 mg (300 mg Oral Given 12/05/19 2207)  mometasone-formoterol (DULERA) 200-5 MCG/ACT inhaler 2 puff (2 puffs Inhalation Given 12/05/19 2039)  polyethylene glycol (MIRALAX / GLYCOLAX) packet 17 g (has no administration in time range)  HYDROcodone-acetaminophen (NORCO) 7.5-325 MG per tablet 1 tablet (1 tablet Oral Given 12/06/19 0505)  insulin glargine (LANTUS) injection 15 Units (15 Units Subcutaneous Given 12/05/19 2217)  insulin aspart (novoLOG) injection 0-20 Units (has no administration in time range)  insulin aspart (novoLOG) injection 0-5 Units (5 Units Subcutaneous Given 12/05/19 2209)  carvedilol (COREG) tablet 12.5 mg (12.5 mg Oral Given 12/06/19 0505)  diltiazem (CARDIZEM) 125 mg in dextrose 5% 125 mL (1 mg/mL) infusion (5 mg/hr Intravenous New Bag/Given 12/06/19 0653)  diltiazem (CARDIZEM) 1 mg/mL load via infusion 10 mg (10 mg Intravenous Bolus from Bag 12/05/19 0823)  dexamethasone (DECADRON) injection 10 mg (10 mg Intravenous Given 12/05/19 1126)  metoprolol tartrate (LOPRESSOR) injection 5 mg (5 mg Intravenous Given 12/05/19 1247)  metoprolol tartrate (LOPRESSOR) injection 5 mg (5 mg Intravenous Given 12/05/19 2209)  insulin aspart (novoLOG) injection 5 Units (5 Units Subcutaneous Given 12/06/19 0505)  metoprolol tartrate (LOPRESSOR) injection 5 mg (5 mg Intravenous Given 12/06/19 0552)    Note:  This document was prepared using Dragon voice recognition software and may include unintentional dictation errors.  Alona Bene, MD, [facility] Emergency Medicine    Shaquoia Miers, Arlyss Repress,  MD 12/06/19 [phone]

## 2019-12-05 NOTE — ED Triage Notes (Addendum)
To ED by POV for increased sob. Pt states she was exposed to covid about 10 days ago - 4 family members positive and pt was with them. States she's progressively gotten worse. Had Laural Benes and Laural Benes covid vaccine last Wednesday. Hx CHF. Pt is on lasix.

## 2019-12-05 NOTE — H&P (Addendum)
Date: 12/05/2019               Patient Name:  Kaitlin Cantrell MRN: [id number]  DOB: 1956/03/06 Age / Sex: 64 y.o., female   PCP: [name], NP         Medical Service: Internal Medicine Teaching Service         Attending Physician: Dr. Mayford Knife    First Contact: Dr. Marijo Conception Pager: [phone]  Second Contact: Nedra Hai, MD, Ivin Booty Pager: Eustaquio Maize [phone])       After Hours (After 5p/  First Contact Pager: [phone]  weekends / holidays): Second Contact Pager: [phone]   Chief Complaint: Shortness of breath  History of Present Illness: Kaitlin Cantrell is a 64 year old woman with medical history significant for HFrEF EF 55-60% (2020), osteoarthritis, hypertension, COPD, chronic pain syndrome, hyperlipidemia, type 2 diabetes mellitus, CKD stage III, prior history of pulmonary embolism on Xarelto, tobacco use disorder presenting with shortness of breath   She reports that she was in her usual state of health until recently when she began experiencing malaise after attending a family function last week.  She reports that she discovered one family member tested positive for SARS-CoV-2 10 days ago and since then she has noticed shortness of breath, increased work of breathing even to the bathroom, body aches and palpitations.  She also reports of subjective fevers but denies chills.  Presently, she denies chest pain, dizziness, light headedness, abdominal pain, nausea, vomiting but does indicate to a certain degree of decreased oral intake  In the emergency department, she was found to be tachycardic to the 150s and tachypneic to mid 20s.  EKG revealed sinus tachycardia/atrial flutter with 2-1 conduction.  ED provider had initially started patient on diltiazem which improved heart rate to the 130s.  He had curbsided cardiology who recommended a dose of IV Lopressor with improvement to tachycardia.    Lab Orders     SARS Coronavirus 2 by RT PCR (hospital order, performed in [facility] hospital  lab) Nasopharyngeal Nasopharyngeal Swab     Blood Culture (routine x 2)     Basic metabolic panel     CBC     Lactic acid, plasma     D-dimer, quantitative     Procalcitonin     Lactate dehydrogenase     Fibrinogen     Hepatic function panel     Triglycerides     C-reactive protein     Ferritin (Iron Binding Protein)     TSH     Comprehensive metabolic panel     CBC     Hemoglobin A1c   Meds:  Current Meds  Medication Sig   atorvastatin (LIPITOR) 80 MG tablet TAKE 1 TABLET BY MOUTH ONCE DAILY AT  6PM (Patient taking differently: Take 80 mg by mouth daily at 6 PM. )   budesonide-formoterol (SYMBICORT) 160-4.5 MCG/ACT inhaler Inhale 1 puff into the lungs 2 (two) times daily. (Patient taking differently: Inhale 1 puff into the lungs 2 (two) times daily as needed (wheezing, shortness of breath). )   carvedilol (COREG) 12.5 MG tablet TAKE 1 TABLET BY MOUTH TWICE DAILY WITH A MEAL (Patient taking differently: Take 12.5 mg by mouth 2 (two) times daily with a meal. )   cyclobenzaprine (FLEXERIL) 10 MG tablet Take 1 tablet (10 mg total) by mouth 3 (three) times daily as needed. for muscle spams (Patient taking differently: Take 10 mg by mouth 3 (three) times daily as needed for muscle spasms. )  furosemide (LASIX) 40 MG tablet TAKE 1 TABLET BY MOUTH EVERY MONDAY, WEDNESDAY, FRIDAY, AND SATURDAY (Patient taking differently: Take 40 mg by mouth 4 (four) times a week. Every Monday, Wednesday, Friday, and Saturday)   gabapentin (NEURONTIN) 400 MG capsule Take 1 capsule (400 mg total) by mouth 3 (three) times daily.   glimepiride (AMARYL) 4 MG tablet Take 1 tablet by mouth twice daily   HYDROcodone-acetaminophen (NORCO) 7.5-325 MG tablet Take 1 tablet by mouth every 8 (eight) hours as needed for pain.   Insulin Glargine (LANTUS SOLOSTAR) 100 UNIT/ML Solostar Pen Inject 15 Units into the skin daily at 10 pm.   [place] Lanius CAPS Take 1 capsule by mouth daily.    metFORMIN (GLUCOPHAGE) 850 MG  tablet TAKE 1 TABLET BY MOUTH TWICE DAILY WITH A MEAL (Patient taking differently: Take 850 mg by mouth 2 (two) times daily with a meal. )   sacubitril-valsartan (ENTRESTO) 49-51 MG Take 1 tablet by mouth 2 (two) times daily. Needs appt for further refills   traMADol (ULTRAM) 50 MG tablet Take 2 tablets (100 mg total) by mouth every 6 (six) hours as needed for moderate pain.   XARELTO 20 MG TABS tablet Take 1 tablet by mouth once daily (Patient taking differently: Take 20 mg by mouth daily. )     Allergies: Allergies as of 12/05/2019 - Review Complete 12/05/2019  Allergen Reaction Noted   Albuterol Anaphylaxis 01/08/2015   Morphine and related Other (See Comments) 02/03/2012   Past Medical History:  Diagnosis Date   Allergy    seasonal   Arthritis    Diabetes mellitus    GERD (gastroesophageal reflux disease)    History of pneumonia    15 years ago   Hyperlipidemia    Hypertension    PE (pulmonary embolism) 12/2015    Family History: Mother and father with DM and HTN. Both deceased  Social History: Lives in [place] with husband.  About 15 minutes from [place].She is able to complete all her ADLs. Retired. Smoked 1 pack of cigarette/ day since 64 year of age.  Review of Systems: A complete ROS was negative except as per HPI.   Physical Exam: Blood pressure 101/77, pulse (!) 48, temperature 98.4 F (36.9 C), temperature source Oral, resp. rate (!) 24, height 5' 5.75" (1.67 m), weight 115.1 kg, SpO2 97 %. Physical Exam Vitals reviewed.  Constitutional:      Appearance: She is well-developed.     Interventions: She is not intubated. HENT:     Head: Normocephalic and atraumatic.  Cardiovascular:     Rate and Rhythm: Tachycardia present. Rhythm irregular.     Heart sounds: No murmur heard.  No gallop.   Pulmonary:     Effort: Pulmonary effort is normal. No tachypnea or accessory muscle usage. She is not intubated.     Breath sounds: Examination of the  right-middle field reveals wheezing. Examination of the left-middle field reveals wheezing. Examination of the right-lower field reveals wheezing. Examination of the left-lower field reveals wheezing. Wheezing (Mild) present. No decreased breath sounds, rhonchi or rales.  Chest:     Chest wall: No tenderness or crepitus.  Abdominal:     General: Bowel sounds are normal.     Palpations: Abdomen is soft.     Tenderness: There is no abdominal tenderness.  Musculoskeletal:     Cervical back: Neck supple.     Right lower leg: No tenderness. No edema.     Left lower leg: No tenderness. No edema.  Skin:    General: Skin is warm.  Neurological:     Mental Status: She is alert.  Psychiatric:        Mood and Affect: Mood normal.        Behavior: Behavior normal.     EKG: Initially showed tachycardia, repeat EKG showed atrial flutter  CXR: personally reviewed my interpretation is unremarkable  Assessment & Plan by Problem: Principal Problem:   Atrial flutter (HCC) Active Problems:   HTN (hypertension)   Osteoarthritis of right knee   Type 2 diabetes mellitus without complication, without long-term current use of insulin (HCC)   Arthritis   Hyperlipidemia   Chronic pain syndrome   Chronic obstructive pulmonary disease (HCC)   Nicotine dependence   Pulmonary embolism with acute cor pulmonale (HCC)   Combined systolic and diastolic congestive heart failure (HCC)   COVID-19   Ms. Milbrath is a 64 year old woman with medical history significant for HFpEF EF 55-60% (2020), osteoarthritis, hypertension, COPD, chronic pain syndrome, hyperlipidemia, type 2 diabetes mellitus, CKD stage III, prior history of pulmonary embolism on Xarelto, tobacco use disorder presenting with shortness of breath   #Dyspnea secondary to COVID-19 Fortunately, she is not requiring supplemental oxygen and SPO2 has stayed above 94%.  Chest x-ray unremarkable. -Hold off initiating remdesivir and Decadron as SPO2  >94% -Monitor clinical status -Supplemental O2 as needed   #Atrial flutter, new onset Tachycardic to 150s in the emergency department.  EKG shows atrial flutter pattern.  Unclear etiology but most likely secondary to systemic inflammatory response from COVID-19.  Started on IV diltiazem in the ED and received one-time dose of Lopressor IV 5 mg. HR now in the 110s-120s -Follow-up troponin -Follow-up TSH -Continue diltiazem gtt with plan to transition to oral diltiazem or increase home coreg    #Hypertension -BP unremarkable -Hold Coreg, Lasix, Entresto as she remains on diltiazem gtt   #COPD -Continue Dulera -Tobacco cessation    #Type 2 diabetes mellitus -Start Lantus 10U qhs + SSI -A1C   #CKD stage III -sCr 1.58 (baseline 1.4-1.59 in 2020) -Hold Entresto as she remains on Dilt gtt -Daily CMP   #History of HFrEF EF 55 to 60% Appears euvolemic -Hold home coreg -Hold Entresto for now    #Chronic pain syndrome -Continue gabapentin, Norco, flexeril   #Hx of PE -Continue Xarelto    #HLD -Continue Lipitor   FEN:HH/CM VTE ZOX:[id number]  CODE STATUS:FULL  Prior to Admission Living Arrangement: Home  Anticipated Discharge Location: Home Barriers to Discharge: Management of Atrial flutter   Dispo: Admit patient to Observation with expected length of stay less than 2 midnights.  Signed: Yvette Rack, MD 12/05/2019, 1:25 PM  Pager: [phone] Internal Medicine Teaching Service After 5pm on weekdays and 1pm on weekends: On Call pager: [phone]

## 2019-12-05 NOTE — ED Notes (Signed)
Dinner Tray Ordered @ 1740. 

## 2019-12-06 DIAGNOSIS — Z8371 Family history of colonic polyps: Secondary | ICD-10-CM | POA: Diagnosis not present

## 2019-12-06 DIAGNOSIS — I4892 Unspecified atrial flutter: Secondary | ICD-10-CM | POA: Diagnosis present

## 2019-12-06 DIAGNOSIS — Z888 Allergy status to other drugs, medicaments and biological substances status: Secondary | ICD-10-CM | POA: Diagnosis not present

## 2019-12-06 DIAGNOSIS — Z885 Allergy status to narcotic agent status: Secondary | ICD-10-CM | POA: Diagnosis not present

## 2019-12-06 DIAGNOSIS — J449 Chronic obstructive pulmonary disease, unspecified: Secondary | ICD-10-CM | POA: Diagnosis present

## 2019-12-06 DIAGNOSIS — N1831 Chronic kidney disease, stage 3a: Secondary | ICD-10-CM | POA: Diagnosis present

## 2019-12-06 DIAGNOSIS — R002 Palpitations: Secondary | ICD-10-CM | POA: Diagnosis present

## 2019-12-06 DIAGNOSIS — I130 Hypertensive heart and chronic kidney disease with heart failure and stage 1 through stage 4 chronic kidney disease, or unspecified chronic kidney disease: Secondary | ICD-10-CM | POA: Diagnosis present

## 2019-12-06 DIAGNOSIS — Z86711 Personal history of pulmonary embolism: Secondary | ICD-10-CM

## 2019-12-06 DIAGNOSIS — U071 COVID-19: Secondary | ICD-10-CM | POA: Diagnosis present

## 2019-12-06 DIAGNOSIS — G894 Chronic pain syndrome: Secondary | ICD-10-CM | POA: Diagnosis present

## 2019-12-06 DIAGNOSIS — Z833 Family history of diabetes mellitus: Secondary | ICD-10-CM | POA: Diagnosis not present

## 2019-12-06 DIAGNOSIS — E1122 Type 2 diabetes mellitus with diabetic chronic kidney disease: Secondary | ICD-10-CM

## 2019-12-06 DIAGNOSIS — Z7901 Long term (current) use of anticoagulants: Secondary | ICD-10-CM | POA: Diagnosis not present

## 2019-12-06 DIAGNOSIS — Z720 Tobacco use: Secondary | ICD-10-CM

## 2019-12-06 DIAGNOSIS — I10 Essential (primary) hypertension: Secondary | ICD-10-CM | POA: Diagnosis not present

## 2019-12-06 DIAGNOSIS — I483 Typical atrial flutter: Secondary | ICD-10-CM | POA: Diagnosis present

## 2019-12-06 DIAGNOSIS — Z6841 Body Mass Index (BMI) 40.0 and over, adult: Secondary | ICD-10-CM | POA: Diagnosis not present

## 2019-12-06 DIAGNOSIS — I5022 Chronic systolic (congestive) heart failure: Secondary | ICD-10-CM | POA: Diagnosis present

## 2019-12-06 DIAGNOSIS — I428 Other cardiomyopathies: Secondary | ICD-10-CM | POA: Diagnosis present

## 2019-12-06 DIAGNOSIS — M1711 Unilateral primary osteoarthritis, right knee: Secondary | ICD-10-CM | POA: Diagnosis present

## 2019-12-06 DIAGNOSIS — I2609 Other pulmonary embolism with acute cor pulmonale: Secondary | ICD-10-CM | POA: Diagnosis present

## 2019-12-06 DIAGNOSIS — Z96652 Presence of left artificial knee joint: Secondary | ICD-10-CM | POA: Diagnosis present

## 2019-12-06 DIAGNOSIS — E785 Hyperlipidemia, unspecified: Secondary | ICD-10-CM | POA: Diagnosis present

## 2019-12-06 DIAGNOSIS — F17210 Nicotine dependence, cigarettes, uncomplicated: Secondary | ICD-10-CM | POA: Diagnosis present

## 2019-12-06 DIAGNOSIS — Z825 Family history of asthma and other chronic lower respiratory diseases: Secondary | ICD-10-CM | POA: Diagnosis not present

## 2019-12-06 DIAGNOSIS — B974 Respiratory syncytial virus as the cause of diseases classified elsewhere: Secondary | ICD-10-CM | POA: Diagnosis not present

## 2019-12-06 DIAGNOSIS — Z8249 Family history of ischemic heart disease and other diseases of the circulatory system: Secondary | ICD-10-CM | POA: Diagnosis not present

## 2019-12-06 DIAGNOSIS — N183 Chronic kidney disease, stage 3 unspecified: Secondary | ICD-10-CM

## 2019-12-06 HISTORY — DX: Unspecified atrial flutter: I48.92

## 2019-12-06 LAB — CBC
HCT: 36.7 % (ref 36.0–46.0)
Hemoglobin: 11.3 g/dL — ABNORMAL LOW (ref 12.0–15.0)
MCH: 25.3 pg — ABNORMAL LOW (ref 26.0–34.0)
MCHC: 30.8 g/dL (ref 30.0–36.0)
MCV: 82.1 fL (ref 80.0–100.0)
Platelets: 468 10*3/uL — ABNORMAL HIGH (ref 150–400)
RBC: 4.47 MIL/uL (ref 3.87–5.11)
RDW: 14.7 % (ref 11.5–15.5)
WBC: 14.2 10*3/uL — ABNORMAL HIGH (ref 4.0–10.5)
nRBC: 0 % (ref 0.0–0.2)

## 2019-12-06 LAB — COMPREHENSIVE METABOLIC PANEL
ALT: 9 U/L (ref 0–44)
AST: 22 U/L (ref 15–41)
Albumin: 2.5 g/dL — ABNORMAL LOW (ref 3.5–5.0)
Alkaline Phosphatase: 77 U/L (ref 38–126)
Anion gap: 13 (ref 5–15)
BUN: 29 mg/dL — ABNORMAL HIGH (ref 8–23)
CO2: 20 mmol/L — ABNORMAL LOW (ref 22–32)
Calcium: 9.4 mg/dL (ref 8.9–10.3)
Chloride: 96 mmol/L — ABNORMAL LOW (ref 98–111)
Creatinine, Ser: 1.33 mg/dL — ABNORMAL HIGH (ref 0.44–1.00)
GFR calc Af Amer: 50 mL/min — ABNORMAL LOW (ref >60)
GFR calc non Af Amer: 43 mL/min — ABNORMAL LOW (ref >60)
Glucose, Bld: 328 mg/dL — ABNORMAL HIGH (ref 70–99)
Potassium: 5.1 mmol/L (ref 3.5–5.1)
Sodium: 129 mmol/L — ABNORMAL LOW (ref 135–145)
Total Bilirubin: 0.9 mg/dL (ref 0.3–1.2)
Total Protein: 7.2 g/dL (ref 6.5–8.1)

## 2019-12-06 LAB — D-DIMER, QUANTITATIVE: D-Dimer, Quant: 1.4 ug/mL-FEU — ABNORMAL HIGH (ref 0.00–0.50)

## 2019-12-06 LAB — GLUCOSE, CAPILLARY
Glucose-Capillary: 342 mg/dL — ABNORMAL HIGH (ref 70–99)
Glucose-Capillary: 305 mg/dL — ABNORMAL HIGH (ref 70–99)
Glucose-Capillary: 296 mg/dL — ABNORMAL HIGH (ref 70–99)
Glucose-Capillary: 329 mg/dL — ABNORMAL HIGH (ref 70–99)
Glucose-Capillary: 374 mg/dL — ABNORMAL HIGH (ref 70–99)

## 2019-12-06 LAB — MRSA PCR SCREENING: MRSA by PCR: POSITIVE — AB

## 2019-12-06 LAB — C-REACTIVE PROTEIN: CRP: 0.9 mg/dL (ref <1.0)

## 2019-12-06 MED ORDER — CARVEDILOL 12.5 MG PO TABS
12.5000 mg | ORAL_TABLET | Freq: Two times a day (BID) | ORAL | Status: DC
  Administered 2019-12-06: 12.5 mg via ORAL
  Filled 2019-12-06: qty 1

## 2019-12-06 MED ORDER — DIGOXIN 0.25 MG/ML IJ SOLN
0.5000 mg | Freq: Once | INTRAMUSCULAR | Status: AC
  Administered 2019-12-06: 0.5 mg via INTRAVENOUS
  Filled 2019-12-06: qty 2

## 2019-12-06 MED ORDER — METOPROLOL TARTRATE 25 MG PO TABS
25.0000 mg | ORAL_TABLET | Freq: Three times a day (TID) | ORAL | Status: DC
  Administered 2019-12-06 – 2019-12-07 (×4): 25 mg via ORAL
  Filled 2019-12-06 (×4): qty 1

## 2019-12-06 MED ORDER — LINAGLIPTIN 5 MG PO TABS
5.0000 mg | ORAL_TABLET | Freq: Every day | ORAL | Status: DC
  Administered 2019-12-06 – 2019-12-11 (×6): 5 mg via ORAL
  Filled 2019-12-06 (×6): qty 1

## 2019-12-06 MED ORDER — METOPROLOL TARTRATE 5 MG/5ML IV SOLN
5.0000 mg | Freq: Once | INTRAVENOUS | Status: AC
  Administered 2019-12-06: 5 mg via INTRAVENOUS
  Filled 2019-12-06: qty 5

## 2019-12-06 MED ORDER — CHLORHEXIDINE GLUCONATE CLOTH 2 % EX PADS
6.0000 | MEDICATED_PAD | Freq: Every day | CUTANEOUS | Status: AC
  Administered 2019-12-06 – 2019-12-10 (×5): 6 via TOPICAL

## 2019-12-06 MED ORDER — MUPIROCIN 2 % EX OINT
1.0000 "application " | TOPICAL_OINTMENT | Freq: Two times a day (BID) | CUTANEOUS | Status: AC
  Administered 2019-12-06 – 2019-12-10 (×10): 1 via NASAL
  Filled 2019-12-06 (×2): qty 22

## 2019-12-06 MED ORDER — INSULIN ASPART 100 UNIT/ML ~~LOC~~ SOLN
5.0000 [IU] | Freq: Once | SUBCUTANEOUS | Status: AC
  Administered 2019-12-06: 5 [IU] via SUBCUTANEOUS

## 2019-12-06 MED ORDER — DILTIAZEM HCL-DEXTROSE 125-5 MG/125ML-% IV SOLN (PREMIX)
0.0000 mg/h | INTRAVENOUS | Status: DC
  Administered 2019-12-06: 5 mg/h via INTRAVENOUS
  Administered 2019-12-06: 7.5 mg/h via INTRAVENOUS
  Administered 2019-12-07: 10 mg/h via INTRAVENOUS
  Administered 2019-12-08: 5 mg/h via INTRAVENOUS
  Administered 2019-12-08: 10 mg/h via INTRAVENOUS
  Administered 2019-12-09: 12.5 mg/h via INTRAVENOUS
  Filled 2019-12-06 (×7): qty 125

## 2019-12-06 NOTE — Progress Notes (Signed)
Patient is sitting up in bed in no acute distress. Patient is comfortable and has no complaints.  Patient has no chest pain no sob, no n/v or dizziness.  Patient heart rate is now decreased since administering medications ordered by cardiologist.  Patient remains on diltiazem drip.  Continuing to monitor patient

## 2019-12-06 NOTE — Plan of Care (Signed)

## 2019-12-06 NOTE — Progress Notes (Signed)
[name] was admitted to 5w12 from the ED via stretcher.  The patient is alert and oriented x4, able to ambulate independently with standby assist.  Patient oriented to room.  Bed is in the lowest position, call bell and telephone are within reach.  Explained to patient how to use call bell and telephone, patient indicated understanding.  Admission booklet given, COVID-19 visitation restrictions reviewed with patient, patient indicated understanding.

## 2019-12-06 NOTE — Progress Notes (Signed)
This note also relates to the following rows which could not be included: BP - Cannot attach notes to unvalidated device data MAP (mmHg) - Cannot attach notes to unvalidated device data Pulse Rate - Cannot attach notes to unvalidated device data ECG Heart Rate - Cannot attach notes to unvalidated device data Resp - Cannot attach notes to unvalidated device data SpO2 - Cannot attach notes to unvalidated device data    12/06/19 0852  Vitals  BP Location Left Arm  BP Method Automatic  Patient Position (if appropriate) Sitting  Pulse Rate Source Monitor  Level of Consciousness  Level of Consciousness Alert  MEWS COLOR  MEWS Score Color Yellow  MEWS Score  MEWS Temp 0  MEWS Systolic 0  MEWS Pulse 3  MEWS RR 0  MEWS LOC 0  MEWS Score 3  Titrating cardizem up 2.5 mg/hr  Now will be 7.5 mg/hr for bp and heart rate in ranges.

## 2019-12-06 NOTE — Consult Note (Signed)
Cardiology Consultation:   Patient ID: Kaitlin Cantrell MRN: [id number]; DOB: Feb 06, 1956  Admit date: 12/05/2019 Date of Consult: 12/06/2019  Primary Care Provider: [name], NP [facility] Cardiologist  Shirlee Latch    Patient Profile:   Kaitlin Cantrell is a 64 y.o. female with a hx of cardiomyopathy who is being seen today for Kaitlin evaluation of atrial flutter  at Kaitlin request of Dr Oswaldo Done    History of Present Illness:   Kaitlin Cantrell is a 64 yo with hx of DM, HTN, tob aburese, COPD, submassive PE and NICM   LVEF in 2017 was 20 to 25%  RV mildly reduced.  Cath in 2017 no significant CAD   Echo in March 2018 LVEF 55% and in July 2020 55 to 60%    Kaitlin Cantrell was last seen by [facility] in July 2020  Kaitlin Cantrell reported feeling OK until last week   One of family member tested + for COVID 19   Kaitlin Cantrell started notinge SOB, aches and plapitations.    Came to [facility] ED     IN ED was tachycardic with rates 150s     Cardiology curbsided   Recomm IV lopressor   Since admit Cantrell has been treated with IV diltiazem and lopressor Review of telemetry since Amid HR initally 100s then increased to 140s/150  Back down to 100s -120s   Atypical atrial flutter, at times looks more like atrial fib    I spoke to Kaitlin patient on Kaitlin phone (minimizing exposure with COVID) Kaitlin Cantrell says Kaitlin Cantrell was doing OK until just recently    Kaitlin Cantrell says Kaitlin Cantrell had noted occasional palpitations in past but those were short lived   Her breathing had been ok  No CP  No edema  Able to lay flat Now Kaitlin Cantrell notes more SOB though better today than yesterday Feels heart race a little   No LE swelling   Past Medical History:  Diagnosis Date  . Allergy    seasonal  . Arthritis   . Diabetes mellitus   . GERD (gastroesophageal reflux disease)   . History of pneumonia    15 years ago  . Hyperlipidemia   . Hypertension   . PE (pulmonary embolism) 12/2015    Past Surgical History:  Procedure Laterality Date  . ANKLE FRACTURE SURGERY  1974    left and pinned then pins removed  . ANKLE GANGLION CYST EXCISION  1994   right  . BACK SURGERY  1998   discectomy and then fusion  . CARDIAC CATHETERIZATION N/A 04/05/2016   Procedure: Right/Left Heart Cath and Coronary Angiography;  Surgeon: Laurey Morale, MD;  Location: [facility] INVASIVE CV LAB;  Service: Cardiovascular;  Laterality: N/A;  . KNEE ARTHROPLASTY  02/14/2012   Procedure: COMPUTER ASSISTED TOTAL KNEE ARTHROPLASTY;  Surgeon: Cammy Copa, MD;  Location[facility] OR;  Service: Orthopedics;  Laterality: Left;  Left total knee arthroplasty  . TONSILLECTOMY  1972  . TUBAL LIGATION  1996  . UMBILICAL HERNIA REPAIR  1996       Inpatient Medications: Scheduled Meds: . atorvastatin  80 mg Oral q1800  . carvedilol  12.5 mg Oral BID WC  . Chlorhexidine Gluconate Cloth  6 each Topical Q0600  . gabapentin  300 mg Oral TID  . insulin aspart  0-20 Units Subcutaneous TID WC  . insulin aspart  0-5 Units Subcutaneous QHS  . insulin glargine  15 Units Subcutaneous QHS  . mometasone-formoterol  2 puff Inhalation BID  .  mupirocin ointment  1 application Nasal BID  . rivaroxaban  20 mg Oral Daily   Continuous Infusions: . diltiazem (CARDIZEM) infusion 5 mg/hr (12/06/19 0653)   PRN Meds: cyclobenzaprine, HYDROcodone-acetaminophen, polyethylene glycol  Allergies:    Allergies  Allergen Reactions  . Albuterol Anaphylaxis    Throat/lips swelling  . Morphine And Related Other (See Comments)    Very aggressive and doesn't help pain    Social History:   Social History   Socioeconomic History  . Marital status: Married    Spouse name: Not on file  . Number of children: 0  . Years of education: Not on file  . Highest education level: Not on file  Occupational History  . Occupation: retired  Tobacco Use  . Smoking status: Current Every Day Smoker    Packs/day: 1.00    Years: 45.00    Pack years: 45.00    Types: Cigarettes  . Smokeless tobacco: Never Used  Vaping Use  . Vaping  Use: Never used  Substance and Sexual Activity  . Alcohol use: Yes    Comment: rarely  . Drug use: No  . Sexual activity: Not Currently  Other Topics Concern  . Not on file  Social History Narrative   Exercise walking daily (varies)   Social Determinants of Health   Financial Resource Strain:   . Difficulty of Paying Living Expenses:   Food Insecurity:   . Worried About Programme researcher, broadcasting/film/video in [place] Last Year:   . Barista in [place] Last Year:   Transportation Needs:   . Freight forwarder (Medical):   Marland Kitchen Lack of Transportation (Non-Medical):   Physical Activity:   . Days of Exercise per Week:   . Minutes of Exercise per Session:   Stress:   . Feeling of Stress :   Social Connections:   . Frequency of Communication with Friends and Family:   . Frequency of Social Gatherings with Friends and Family:   . Attends Religious Services:   . Active Member of Clubs or Organizations:   . Attends Banker Meetings:   Marland Kitchen Marital Status:   Intimate Partner Violence:   . Fear of Current or Ex-Partner:   . Emotionally Abused:   Marland Kitchen Physically Abused:   . Sexually Abused:     Family History:    Family History  Problem Relation Age of Onset  . Colon polyps Father   . Diabetes Father        mother  . Heart disease Father   . Heart murmur Mother   . Diabetes Mother   . Hypertension Mother   . Diabetes Brother   . Cancer Maternal Grandmother   . Diabetes Paternal Grandmother   . Heart disease Paternal Grandmother   . COPD Paternal Grandfather   . Heart disease Paternal Grandfather   . Hypertension Brother   . Hypertension Brother      ROS:  Please see Kaitlin history of present illness.  All other ROS reviewed and negative.     Physical Exam/Data:   Vitals:   12/06/19 0540 12/06/19 0648 12/06/19 0725 12/06/19 0940  BP: 116/81 116/83 108/86 137/71  Pulse: (!) 145 (!) 134 (!) 141 (!) 113  Resp: 17 19 20  (!) 21  Temp: 98.1 F (36.7 C) 98.2 F (36.8 C) 98.3  F (36.8 C) 98.1 F (36.7 C)  TempSrc: Oral Oral Oral Oral  SpO2: 97% 96% 97% 94%  Weight:      Height:  Intake/Output Summary (Last 24 hours) at 12/06/2019 1027 Last data filed at 12/06/2019 0900 Gross per 24 hour  Intake 531.98 ml  Output --  Net 531.98 ml   Last 3 Weights 12/05/2019 11/14/2018 03/10/2018  Weight (lbs) 253 lb 12 oz 253 lb 12.8 oz 246 lb 3.2 oz  Weight (kg) 115.1 kg 115.123 kg 111.676 kg     Body mass index is 41.27 kg/m.  Exam deferred for now given COVID +  Reviewed chart/exam   Speaking with Cantrell on phone, Kaitlin Cantrell had coughing, mild wheeze   EKG:  Kaitlin EKG was personally reviewed and demonstrates:   12/05/19:  Atrial flutter with 2:1 conduction   151 bpm    8/5 21   At 22:01:  Probable atrial flutter with variable contduciton   103 bpm  Anterior MI  Lateral MI (? Lead placemement as not acute)  Telemetry:  Telemetry was personally reviewed and demonstrates:  As noted above   Relevant CV Studies:  Echo  July 2020  1. Kaitlin left ventricle has normal systolic function, with an ejection  fraction of 55-60%. Kaitlin cavity size was normal. Left ventricular diastolic  Doppler parameters are consistent with pseudonormalization. No evidence of  left ventricular regional wall  motion abnormalities. GLS -23.1%.  2. Kaitlin right ventricle has normal systolic function. Kaitlin cavity was  normal. There is no increase in right ventricular wall thickness.  3. No evidence of mitral valve stenosis. Trivial mitral regurgitation.  4. Kaitlin aortic valve is tricuspid. Mild calcification of Kaitlin aortic valve.  No stenosis of Kaitlin aortic valve.  5. Kaitlin aortic root is normal in size and structure.  6. Kaitlin IVC is normal in size. No complete TR doppler jet so unable to  estimate PA systolic pressure.   Laboratory Data:  High Sensitivity Troponin:   Recent Labs  Lab 12/05/19 0925 12/05/19 1439  TROPONINIHS 27* 25*     Chemistry Recent Labs  Lab 12/05/19 0616 12/06/19 0345  NA  131* 129*  K 4.8 5.1  CL 96* 96*  CO2 21* 20*  GLUCOSE 297* 328*  BUN 23 29*  CREATININE 1.58* 1.33*  CALCIUM 9.2 9.4  GFRNONAA 35* 43*  GFRAA 41* 50*  ANIONGAP 14 13    Recent Labs  Lab 12/05/19 0827 12/06/19 0345  PROT 7.7 7.2  ALBUMIN 2.9* 2.5*  AST 15 22  ALT 9 9  ALKPHOS 78 77  BILITOT 0.7 0.9   Hematology Recent Labs  Lab 12/05/19 0616 12/06/19 0345  WBC 12.5* 14.2*  RBC 4.54 4.47  HGB 11.6* 11.3*  HCT 37.6 36.7  MCV 82.8 82.1  MCH 25.6* 25.3*  MCHC 30.9 30.8  RDW 14.6 14.7  PLT 640* 468*   BNPNo results for input(s): BNP, PROBNP in Kaitlin last 168 hours.  DDimer  Recent Labs  Lab 12/05/19 0827 12/06/19 0345  DDIMER 1.75* 1.40*     Radiology/Studies:  DG Chest Port 1 View  Result Date: 12/05/2019 CLINICAL DATA:  Shortness of breath EXAM: PORTABLE CHEST 1 VIEW COMPARISON:  December 30, 2015 FINDINGS: Lungs are clear. Heart size and pulmonary vascularity are normal. No adenopathy. No bone lesions. IMPRESSION: Lungs clear.  Cardiac silhouette within normal limits. Electronically Signed   By: Bretta Bang III M.D.   On: 12/05/2019 08:12   {   Assessment and Plan:   1  Atrial flutter   Tropoinin trivially elevated at 25 prob due to tachycardia as well as COVID 19 + Currently on coreg as well  as IV diltiazem  Just got IV lopressor REcomm:  Continue IV dilt for right now   WOuld switch coreg to lopressor 25 q 8 hours to start   Follow BP Would give 1 dose IV digoxin now.    Speaking with patient Kaitlin Cantrell had some mild wheezing, heavy cough (hx continued tobacco)  Although amiodarone is an option would try other agents for now   May need to consider if does not respond  Kaitlin Cantrell has been on Xarelto and has not missed any doses over Kaitlin past month.  Rare prior to that    2  Hx NICM   Last echo in July 2020 LVEF normal   Clinically Cantrell was doing OK until just recently  CXR without CHF or cardiomegaly  I do not think represents CHF exacerbation   3  COVID 19  +  4  Hx HTN  BP has been ok despite HR being elevated    5  Hx COPD  6 Hx of PE in past  SUbmassive  Cantrell continues on Xarelto for this    For questions or updates, please contact [facility] Please consult [email or web address] for contact info under    Signed, Dietrich Pates, MD  12/06/2019 10:27 AM

## 2019-12-06 NOTE — Progress Notes (Addendum)
Inpatient Diabetes Program Recommendations  AACE/ADA: New Consensus Statement on Inpatient Glycemic Control (2015)  Target Ranges:  Prepandial:   less than 140 mg/dL      Peak postprandial:   less than 180 mg/dL (1-2 hours)      Critically ill patients:  140 - 180 mg/dL   Lab Results  Component Value Date   GLUCAP 305 (H) 12/06/2019   HGBA1C 7.6 (H) 12/05/2019    Review of Glycemic Control Results for Kaitlin Cantrell, Kaitlin Cantrell (MRN [id number]) as of 12/06/2019 09:13  Ref. Range 12/05/2019 16:33 12/05/2019 21:33 12/06/2019 04:23 12/06/2019 07:25  Glucose-Capillary Latest Ref Range: 70 - 99 mg/dL 915 (H) 056 (H) 979 (H) 305 (H)   A Flutter/COVID+ Diabetes history: DM 2 Outpatient Diabetes medications: Amaryl 1 mg Daily, Lantus 15 units qhs, metformin 850 mg bid Current orders for Inpatient glycemic control:  Lantus 15 units qhs Novolog 0-20 units tid + hs  Inpatient Diabetes Program Recommendations:    Glucose 305 this am. Pt received Decadron 10 mg yesterday. Effects will last at least 24-48 hours before trends start to decrease.  -Consider one time extra dose of Lantus 10 units this am. - Add Tradjenta 5 mg Daily (part of COVID glycemic control order set research article link available ion order set) - may also consider meal coverage if trends increase postprandially.  Will need to reassess fasting glucose tomorrow for further titration.  Thanks,  Christena Deem RN, MSN, BC-ADM Inpatient Diabetes Coordinator Team Pager [phone] (8a-5p)

## 2019-12-06 NOTE — Progress Notes (Signed)
   12/06/19 1756  Vitals  Temp 97.9 F (36.6 C)  Temp Source Oral  BP 136/74  BP Location Right Arm  BP Method Automatic  Patient Position (if appropriate) Lying  Pulse Rate 73  Pulse Rate Source Monitor  Resp 20  Level of Consciousness  Level of Consciousness Alert  MEWS COLOR  MEWS Score Color Green  Oxygen Therapy  SpO2 98 %  O2 Device Room Air  Patient Activity (if Appropriate) In bed  Pain Assessment  Pain Scale 0-10  Pain Score 0  MEWS Score  MEWS Temp 0  MEWS Systolic 0  MEWS Pulse 0  MEWS RR 0  MEWS LOC 0  MEWS Score 0  Patient MEWS SCORE NOW GREEN

## 2019-12-06 NOTE — Progress Notes (Addendum)
Subjective:  Kaitlin Cantrell is a 64 y.o. with PMH of DM, HTN, tobacco abuse, COPD, submassive PE, non-ischemic cardiomyopathy, systolic heart failure (EF 20-25->55-60 w/ treatment) admit for palpitations on hospital day 0  Kaitlin Cantrell was examined and evaluated at bedside this am. She mentions having 'racing sensation' in her heart. She also endorse frequent coughs. Denies any acute chest pain or dyspnea. Mentions that she spoke with Dr.Ross earlier today regarding adjustments to her medications.   Objective:  Vital signs in last 24 hours: Vitals:   12/06/19 0540 12/06/19 0648 12/06/19 0725 12/06/19 0940  BP: 116/81 116/83 108/86 137/71  Pulse: (!) 145 (!) 134 (!) 141 (!) 113  Resp: 17 19 20  (!) 21  Temp: 98.1 F (36.7 C) 98.2 F (36.8 C) 98.3 F (36.8 C) 98.1 F (36.7 C)  TempSrc: Oral Oral Oral Oral  SpO2: 97% 96% 97% 94%  Weight:      Height:       Gen: Well-developed, well nourished, NAD HEENT: NCAT head, hearing intact Neck: supple, ROM intact CV: Irregularly irregular, no obvious murmurs or rubs Pulm: Distant breath sounds, no rales, mild expiratory wheezes Extm: ROM intact, Peripheral pulses intact, No peripheral edema Skin: Dry, Warm, normal turgor  Assessment/Plan:  Principal Problem:   Atrial flutter (HCC) Active Problems:   HTN (hypertension)   Osteoarthritis of right knee   Type 2 diabetes mellitus without complication, without long-term current use of insulin (HCC)   Arthritis   Hyperlipidemia   Chronic pain syndrome   Chronic obstructive pulmonary disease (HCC)   Nicotine dependence   Pulmonary embolism with acute cor pulmonale (HCC)   Combined systolic and diastolic congestive heart failure (HCC)   COVID-19  Kaitlin Cantrell is a 64 y.o. with PMH of DM, HTN, tobacco abuse, COPD, submassive PE, non-ischemic cardiomyopathy, systolic heart failure (EF 20-25->55-60 w/ treatment) admit for new onset atrial flutter with RVR and COVID  infection.  Atrial Flutter with RVR Present w/ new onset arrhythmia. No prior history. Does have significant structural heart disease hx. Suspected inciting event due to COVID pneumonia. CHADSVASC2 score of 4 on Xarelto at home for PE On diltiazem gtt since admission with fluctuating heart rate. HR 130s-140s this am. Overnight also received metoprolol IV & PO w/ internal improvement. Denies any chest pain. BP stable at 126/105. Cardiology on board.  - Appreciate cardiology recs: Start PO metoprolol, 1 time digoxin, c/w dilt gtt - Dilt gtt (goal hr <110) - Telemetry/BP checks - May need cardioversion or ablation if continues to be in RVR  COVID infection COPD Presents w/ COVID + test on admission. Noted to have elevated mild inflammatory markers but improving (CRP 1.5->0.9). Not currently requiring oxygen but does have subjective coughs which she attributes in part due to COPD and tobacco history. On symbicort at home - C/w dulera BID - C/w monitor for hypoxia - No indication for steroid/remdesivir initiation for COVID - C/w airborne/contact precautions  Non-ischemic cardiomyopathy Chronic systolic heart failure Has hx of non-ischemic cardiomyopathy noted on prior admission on 2017 w/ submassive PE w/ subsequent follow up R/L cath showing no significant CAD. W/ treatment had improvement in ED w/ most recent Echo on 10/2018 w/ EF 55-60%. Currently appear euvolemic. Not endorsing chest pain. Troponin stable 27->25 - C/w monitor - Holding home meds in setting of diltiazem gtt - I/Os, daily weights  Hx of Submassive pulmonary embolism Prolonged admission on 2017 w/ submassive PE. Has been on Xarelto. No obvious bleeding event overnight -  Monitor for bleed - C/w Xarelto  Chronic Kidney Disease stage 3 Has hx of CKD 3 w/ baseline GFR ~40-50s. (creatinine 1.4) Currently at baseline (Creatinine 1.33) - Avoid nephrotoxic meds when able - Trend renal fx  DVT prophx: Xarelto Diet: CM/HH Bowel:  Miralax Code: Full  Prior to Admission Living Arrangement: Home Anticipated Discharge Location: Home Barriers to Discharge: Medical treatment Dispo: Anticipated discharge in approximately 1-2 day(s).   Theotis Barrio, MD 12/06/2019, 12:16 PM Pager: [phone] After 5pm on weekdays and 1pm on weekends: On Call Pager: [phone]

## 2019-12-06 NOTE — Progress Notes (Addendum)
   12/06/19 0725  Assess: MEWS Score  Temp 98.3 F (36.8 C)  BP 108/86  Pulse Rate (!) 141  ECG Heart Rate (!) 133  Resp 20  SpO2 97 %  O2 Device Room Air  Patient Activity (if Appropriate) In bed  Assess: MEWS Score  MEWS Temp 0  MEWS Systolic 0  MEWS Pulse 3  MEWS RR 0  MEWS LOC 0  MEWS Score 3  MEWS Score Color Yellow  Treat  Pain Scale 0-10  Pain Score 0  Notify: Charge Nurse/RN  Name of Charge Nurse/RN Notified 26  Date Charge Nurse/RN Notified 12/06/19  Time Charge Nurse/RN Notified 5631  Notify: Provider  Provider Name/Title Dr. Nedra Hai  Date Provider Notified 12/06/19  Time Provider Notified [phone]  Notification Type Page  Notification Reason Other (Comment);Change in status  Response No new orders  Date of Provider Response 12/06/19  Time of Provider Response 0730

## 2019-12-06 NOTE — Progress Notes (Signed)
Patient in bed upright sitting position eating her dinner and in no acute distress. Patient bed is low and in locked position, has 2 side rails up, call bell within reach, bedside cardiac monitor is on.  Patient has no requests at this time.

## 2019-12-06 NOTE — Progress Notes (Signed)
This note also relates to the following rows which could not be included: Pulse Rate - Cannot attach notes to unvalidated device data ECG Heart Rate - Cannot attach notes to unvalidated device data Resp - Cannot attach notes to unvalidated device data SpO2 - Cannot attach notes to unvalidated device data    12/06/19 0623  Document  Patient Outcome Not stable and remains on department  Progress note created (see row info) Yes   After administration of 5mg  metoprolol, HR remains 140's sustained.  Dr. notified.  Awaiting new orders.

## 2019-12-06 NOTE — Progress Notes (Signed)
   12/06/19 0540  Assess: MEWS Score  Temp 98.1 F (36.7 C)  BP 116/81  Pulse Rate (!) 145  ECG Heart Rate (!) 145  Resp 17  Level of Consciousness Alert  SpO2 97 %  O2 Device Room Air  Patient Activity (if Appropriate) In bed  Assess: MEWS Score  MEWS Temp 0  MEWS Systolic 0  MEWS Pulse 3  MEWS RR 0  MEWS LOC 0  MEWS Score 3  MEWS Score Color Yellow  Assess: if the MEWS score is Yellow or Red  Were vital signs taken at a resting state? Yes  Focused Assessment Change from prior assessment (see assessment flowsheet)  Early Detection of Sepsis Score *See Row Information* Low  MEWS guidelines implemented *See Row Information* Yes  Treat  MEWS Interventions Escalated (See documentation below)  Take Vital Signs  Increase Vital Sign Frequency  Yellow: Q 2hr X 2 then Q 4hr X 2, if remains yellow, continue Q 4hrs  Escalate  MEWS: Escalate Yellow: discuss with charge nurse/RN and consider discussing with provider and RRT  Notify: Charge Nurse/RN  Name of Charge Nurse/RN Notified Lawson Radar, RN  Date Charge Nurse/RN Notified 12/06/19  Time Charge Nurse/RN Notified 0544  Notify: Provider  Provider Name/Title Dr. Laddie Aquas, IMTS  Date Provider Notified 12/06/19  Time Provider Notified [phone]  Notification Type Page  Notification Reason Change in status  Response See new orders  Date of Provider Response 12/06/19  Time of Provider Response 0544  Notify: Rapid Response  Name of Rapid Response RN Notified n/a  Document  Progress note created (see row info) Yes   Pt noted to be yellow MEWS d/t elevated HR.  Cardizem gtt was discontinued upon arrival to the floor.  Dr. Laddie Aquas ordered 5mg  metoprolol IV.

## 2019-12-06 NOTE — Progress Notes (Signed)
Spoke with Dr. Nedra Hai and advised that patient's heart rate is remaining in the 140's and Atrial flutter. Discussed Cardizem drip rate and the parimeters to titrate up.  Dr. Nedra Hai advised to leave drip rate at what it is now and himself and his team will be in shortly to see patient and evaluate her then.   Repeated back to keep Cardizem drip at 5mg /hr for now and the team of Dr. will evaluate her when they come in shortly. Advised also that patient Mews is in yellow.  No new orders will continue to monitor.

## 2019-12-06 NOTE — Progress Notes (Addendum)
   12/06/19 1200  Assess: MEWS Score  Temp 98.1 F (36.7 C)  BP 128/66  Pulse Rate (!) 120  ECG Heart Rate (!) 109  Resp (!) 24  Level of Consciousness Alert  SpO2 96 %  O2 Device Room Air  Patient Activity (if Appropriate) In bed  Assess: MEWS Score  MEWS Temp 0  MEWS Systolic 0  MEWS Pulse 1  MEWS RR 1  MEWS LOC 0  MEWS Score 2  MEWS Score Color Yellow  Assess: if the MEWS score is Yellow or Red  Were vital signs taken at a resting state? Yes  Focused Assessment No change from prior assessment  Early Detection of Sepsis Score *See Row Information* Low  MEWS guidelines implemented *See Row Information* No, previously yellow, continue vital signs every 4 hours  Treat  MEWS Interventions Administered scheduled meds/treatments  Pain Scale 0-10  Pain Score 0  Escalate  MEWS: Escalate Yellow: discuss with charge nurse/RN and consider discussing with provider and RRT  continuing to monitor patient. MEWS Yellow

## 2019-12-06 NOTE — Progress Notes (Signed)
   12/06/19 1952  Assess: MEWS Score  Temp 97.7 F (36.5 C)  BP 111/83  Pulse Rate 75  ECG Heart Rate 81  Resp 19  Level of Consciousness Alert  SpO2 97 %  O2 Device Room Air  Patient Activity (if Appropriate) In bed  Assess: MEWS Score  MEWS Temp 0  MEWS Systolic 0  MEWS Pulse 0  MEWS RR 0  MEWS LOC 0  MEWS Score 0  MEWS Score Color Green  Assess: if the MEWS score is Yellow or Red  Were vital signs taken at a resting state? Yes  Focused Assessment No change from prior assessment  Early Detection of Sepsis Score *See Row Information* Low  MEWS guidelines implemented *See Row Information* No, other (Comment) (no acute changes)  Treat  Pain Scale 0-10  Pain Score 0  Document  Progress note created (see row info) Yes

## 2019-12-06 NOTE — Progress Notes (Signed)
Chaplain engaged in prayer with Kaitlin Cantrell over the phone uplifting her health. Diera noted that she has been feeling tired and that she just wanted prayer over her situation. Chaplain offered support.  Chaplain will follow-up as needed.

## 2019-12-06 NOTE — Progress Notes (Addendum)
   12/06/19 0940  Assess: MEWS Score  Temp 98.1 F (36.7 C)  BP 137/71  Pulse Rate (!) 113  ECG Heart Rate (!) 116  Resp (!) 21  Level of Consciousness Alert  SpO2 94 %  Assess: MEWS Score  MEWS Temp 0  MEWS Systolic 0  MEWS Pulse 2  MEWS RR 1  MEWS LOC 0  MEWS Score 3  MEWS Score Color Yellow  Treat  MEWS Interventions Administered scheduled meds/treatments  Pain Scale 0-10  Pain Score 0  Notify: Charge Nurse/RN  Name of Charge Nurse/RN Notified Elisa  Date Charge Nurse/RN Notified 12/06/19  Time Charge Nurse/RN Notified 3790  Notify: Provider  Provider Name/Title Dr. Nedra Hai  Date Provider Notified 12/06/19  Time Provider Notified [phone]  Notification Type Page  Notification Reason Change in status;Other (Comment) (YELLOW MEWS)  Response See new orders  Date of Provider Response 12/06/19  Time of Provider Response 0945  Continuing to monitor patient and relay information to providers.

## 2019-12-06 NOTE — Plan of Care (Signed)

## 2019-12-07 DIAGNOSIS — I10 Essential (primary) hypertension: Secondary | ICD-10-CM

## 2019-12-07 DIAGNOSIS — U071 COVID-19: Secondary | ICD-10-CM

## 2019-12-07 LAB — CBC
HCT: 34.2 % — ABNORMAL LOW (ref 36.0–46.0)
Hemoglobin: 10.8 g/dL — ABNORMAL LOW (ref 12.0–15.0)
MCH: 25.8 pg — ABNORMAL LOW (ref 26.0–34.0)
MCHC: 31.6 g/dL (ref 30.0–36.0)
MCV: 81.6 fL (ref 80.0–100.0)
Platelets: 500 10*3/uL — ABNORMAL HIGH (ref 150–400)
RBC: 4.19 MIL/uL (ref 3.87–5.11)
RDW: 14.7 % (ref 11.5–15.5)
WBC: 17.2 10*3/uL — ABNORMAL HIGH (ref 4.0–10.5)
nRBC: 0 % (ref 0.0–0.2)

## 2019-12-07 LAB — GLUCOSE, CAPILLARY
Glucose-Capillary: 266 mg/dL — ABNORMAL HIGH (ref 70–99)
Glucose-Capillary: 241 mg/dL — ABNORMAL HIGH (ref 70–99)
Glucose-Capillary: 360 mg/dL — ABNORMAL HIGH (ref 70–99)
Glucose-Capillary: 221 mg/dL — ABNORMAL HIGH (ref 70–99)

## 2019-12-07 LAB — BASIC METABOLIC PANEL
Anion gap: 12 (ref 5–15)
BUN: 33 mg/dL — ABNORMAL HIGH (ref 8–23)
CO2: 23 mmol/L (ref 22–32)
Calcium: 9.3 mg/dL (ref 8.9–10.3)
Chloride: 92 mmol/L — ABNORMAL LOW (ref 98–111)
Creatinine, Ser: 1.4 mg/dL — ABNORMAL HIGH (ref 0.44–1.00)
GFR calc Af Amer: 47 mL/min — ABNORMAL LOW (ref >60)
GFR calc non Af Amer: 40 mL/min — ABNORMAL LOW (ref >60)
Glucose, Bld: 274 mg/dL — ABNORMAL HIGH (ref 70–99)
Potassium: 4.8 mmol/L (ref 3.5–5.1)
Sodium: 127 mmol/L — ABNORMAL LOW (ref 135–145)

## 2019-12-07 MED ORDER — INSULIN ASPART 100 UNIT/ML ~~LOC~~ SOLN
5.0000 [IU] | Freq: Three times a day (TID) | SUBCUTANEOUS | Status: DC
  Administered 2019-12-07 – 2019-12-08 (×2): 5 [IU] via SUBCUTANEOUS

## 2019-12-07 MED ORDER — METOPROLOL TARTRATE 25 MG PO TABS
25.0000 mg | ORAL_TABLET | Freq: Once | ORAL | Status: AC
  Administered 2019-12-07: 25 mg via ORAL
  Filled 2019-12-07: qty 1

## 2019-12-07 MED ORDER — INSULIN GLARGINE 100 UNIT/ML ~~LOC~~ SOLN
20.0000 [IU] | Freq: Every day | SUBCUTANEOUS | Status: DC
  Filled 2019-12-07: qty 0.2

## 2019-12-07 MED ORDER — METOPROLOL TARTRATE 50 MG PO TABS
50.0000 mg | ORAL_TABLET | Freq: Two times a day (BID) | ORAL | Status: DC
  Administered 2019-12-07 – 2019-12-08 (×2): 50 mg via ORAL
  Filled 2019-12-07 (×2): qty 1

## 2019-12-07 MED ORDER — INSULIN GLARGINE 100 UNIT/ML ~~LOC~~ SOLN
15.0000 [IU] | Freq: Every day | SUBCUTANEOUS | Status: DC
  Administered 2019-12-07: 15 [IU] via SUBCUTANEOUS
  Filled 2019-12-07 (×2): qty 0.15

## 2019-12-07 MED ORDER — ALUM & MAG HYDROXIDE-SIMETH 200-200-20 MG/5ML PO SUSP
15.0000 mL | ORAL | Status: DC | PRN
  Administered 2019-12-07 – 2019-12-09 (×4): 15 mL via ORAL
  Filled 2019-12-07 (×4): qty 30

## 2019-12-07 NOTE — Progress Notes (Signed)
Progress Note  Due to the COVID-19 pandemic, this visit was completed with telemedicine (audio/video) technology to reduce patient and provider exposure as well as to preserve personal protective equipment.   Patient Name: Kaitlin Cantrell Date of Encounter: 12/07/2019  Primary Cardiologist: Marca Ancona, MD  Subjective   Remains in atrial fibrillation with RVR on tele this am ub the 100-110's.  Recorded in the 50's in flowsheet but on tele has been elevated since yesterday.  Still SOB when up moving around  Inpatient Medications    Scheduled Meds: . atorvastatin  80 mg Oral q1800  . Chlorhexidine Gluconate Cloth  6 each Topical Q0600  . gabapentin  300 mg Oral TID  . insulin aspart  0-20 Units Subcutaneous TID WC  . insulin aspart  0-5 Units Subcutaneous QHS  . insulin glargine  20 Units Subcutaneous QHS  . linagliptin  5 mg Oral Daily  . metoprolol tartrate  25 mg Oral TID  . mometasone-formoterol  2 puff Inhalation BID  . mupirocin ointment  1 application Nasal BID  . rivaroxaban  20 mg Oral Daily   Continuous Infusions: . diltiazem (CARDIZEM) infusion 7.5 mg/hr (12/06/19 2156)   PRN Meds: cyclobenzaprine, HYDROcodone-acetaminophen, polyethylene glycol   Vital Signs    Vitals:   12/07/19 0700 12/07/19 0800 12/07/19 1000 12/07/19 1208  BP: 117/84 (!) 116/59 (!) 117/57 135/66  Pulse:  95 63 (!) 50  Resp: 19 20 19 20   Temp:  97.7 F (36.5 C) (!) 97.5 F (36.4 C) 98 F (36.7 C)  TempSrc:  Oral Oral Oral  SpO2:  97% 96% 91%  Weight:      Height:        Intake/Output Summary (Last 24 hours) at 12/07/2019 1217 Last data filed at 12/07/2019 0304 Gross per 24 hour  Intake 800 ml  Output --  Net 800 ml   Last 3 Weights 12/05/2019 11/14/2018 03/10/2018  Weight (lbs) 253 lb 12 oz 253 lb 12.8 oz 246 lb 3.2 oz  Weight (kg) 115.1 kg 115.123 kg 111.676 kg      Telemetry    Atrial fibrillation with RVR from 100-110's - Personally Reviewed  ECG    No new EKG to  review - Personally Reviewed  Physical Exam   VITAL SIGNS:  reviewed  Labs    Chemistry Recent Labs  Lab 12/05/19 0616 12/05/19 0827 12/06/19 0345 12/07/19 0447  NA 131*  --  129* 127*  K 4.8  --  5.1 4.8  CL 96*  --  96* 92*  CO2 21*  --  20* 23  GLUCOSE 297*  --  328* 274*  BUN 23  --  29* 33*  CREATININE 1.58*  --  1.33* 1.40*  CALCIUM 9.2  --  9.4 9.3  PROT  --  7.7 7.2  --   ALBUMIN  --  2.9* 2.5*  --   AST  --  15 22  --   ALT  --  9 9  --   ALKPHOS  --  78 77  --   BILITOT  --  0.7 0.9  --   GFRNONAA 35*  --  43* 40*  GFRAA 41*  --  50* 47*  ANIONGAP 14  --  13 12     Hematology Recent Labs  Lab 12/05/19 0616 12/06/19 0345 12/07/19 0447  WBC 12.5* 14.2* 17.2*  RBC 4.54 4.47 4.19  HGB 11.6* 11.3* 10.8*  HCT 37.6 36.7 34.2*  MCV 82.8 82.1 81.6  MCH 25.6* 25.3* 25.8*  MCHC 30.9 30.8 31.6  RDW 14.6 14.7 14.7  PLT 640* 468* 500*    Cardiac EnzymesNo results for input(s): TROPONINI in the last 168 hours. No results for input(s): TROPIPOC in the last 168 hours.   BNPNo results for input(s): BNP, PROBNP in the last 168 hours.   DDimer  Recent Labs  Lab 12/05/19 0827 12/06/19 0345  DDIMER 1.75* 1.40*     Radiology    No results found.  Cardiac Studies   2D echo 10/2018 IMPRESSIONS    1. The left ventricle has normal systolic function, with an ejection  fraction of 55-60%. The cavity size was normal. Left ventricular diastolic  Doppler parameters are consistent with pseudonormalization. No evidence of  left ventricular regional wall  motion abnormalities. GLS -23.1%.  2. The right ventricle has normal systolic function. The cavity was  normal. There is no increase in right ventricular wall thickness.  3. No evidence of mitral valve stenosis. Trivial mitral regurgitation.  4. The aortic valve is tricuspid. Mild calcification of the aortic valve.  No stenosis of the aortic valve.  5. The aortic root is normal in size and structure.    6. The IVC is normal in size. No complete TR doppler jet so unable to  estimate PA systolic pressure.   Patient Profile     64 y.o. female with a hx of cardiomyopathy who is being seen today for the evaluation of atrial flutter  at the request of Dr Oswaldo Done   Assessment & Plan    1  Atrial flutter/fibrillation with RVR -increase Lopressor to 50mg  BID for better HR control -continue IV Cardizem gtt - In setting of COPD, would like to avoid Amio but need to consider if we cannot get her rate controlled on Cardizem and BB -The pt has been on Xarelto and has not missed any doses over the past month.    2  Hx NICM    -Last echo in July 2020 LVEF normal   -Clinically pt was doing OK until just recently   -CXR without CHF or cardiomegaly   3  COVID 19 + -per TRH  4  HTN   -BP controlled at 135/101mmHg  5  Hx COPD  6 Hx of PE in past  - SUbmassive  - Pt continues on Xarelto for this    7 Elevated trop - Tropoinin trivially elevated at 25 prob due to tachycardia as well as COVID 19 +    For questions or updates, please contact [facility] Please consult [email or web address] for contact info under        Signed, 78m, MD  12/07/2019, 12:17 PM

## 2019-12-07 NOTE — Progress Notes (Signed)
Subjective:  Kaitlin Cantrell is a 64 y.o. with PMH of DM, HTN, tobacco abuse, COPD, submassive PE, non-ischemic cardiomyopathy, systolic heart failure (EF 20-25->55-60 w/ treatment) admit for palpitations on hospital day 3  Ms.Stum was examined and evaluated at bedside this am. She mentions having improvement in her palpitations overnight. She denies any chest pain, cough, dyspnea.  Objective:  Vital signs in last 24 hours: Vitals:   12/07/19 0800 12/07/19 1000 12/07/19 1208 12/07/19 1412  BP: (!) 116/59 (!) 117/57 135/66 104/67  Pulse: 95 63  (!) 48  Resp: 20 19 20 19   Temp: 97.7 F (36.5 C) (!) 97.5 F (36.4 C) 98 F (36.7 C) 98.8 F (37.1 C)  TempSrc: Oral Oral Oral Oral  SpO2: 97% 96% 91% 96%  Weight:      Height:       Physical Exam Constitutional:      Appearance: She is well-developed.  Cardiovascular:     Rate and Rhythm: Tachycardia present. Rhythm irregular.  Pulmonary:     Effort: Pulmonary effort is normal. No tachypnea.     Breath sounds: Wheezing present.  Skin:    General: Skin is warm and dry.  Neurological:     Mental Status: She is alert.    Assessment/Plan:  Principal Problem:   Atrial flutter with rapid ventricular response (HCC) Active Problems:   HTN (hypertension)   Type 2 diabetes mellitus without complication, without long-term current use of insulin (HCC)   Chronic obstructive pulmonary disease (HCC)   COVID-19  Kaitlin Cantrell is a 64 y.o. with PMH of DM, HTN, tobacco abuse, COPD, submassive PE, non-ischemic cardiomyopathy, systolic heart failure (EF 20-25->55-60 w/ treatment) admit for new onset atrial flutter with RVR and COVID infection.  Leukocytosis Noted to have increasing leukocytosis w/ wbc 12.5->14.2->17.2. Unclear if sequelae of COVID infection or new super-imposing infection. Denies any significant symptoms suggesting infection. - Get cbc with differential - Monitor for now  Atrial Flutter with RVR Present w/  new onset arrhythmia. No prior history. Does have significant structural heart disease hx. Suspected inciting event due to COVID pneumonia. CHADSVASC2 score of 4 on Xarelto at home for PE. Continues to have difficulty weaning off diltiazem drip. Currently on 7.16mcg. HR fluctuating between 100-120s.   - Appreciate cardiology recs: Increase metoprolol to 50mg  BID, wean off dilt, may need amio but has hx of lung disease w/ COVID - Wean off dilt gtt (goal hr <110) - Telemetry/BP checks - May need cardioversion or ablation if continues to be in RVR  COVID infection COPD Inflammatory markers trending down. Symptoms likely mild due to 4m vaccine. Currently on room air. No indication to start steroids/remdesivir - C/w dulera BID - C/w monitor for hypoxia - C/w airborne/contact precautions  Non-ischemic cardiomyopathy Chronic systolic heart failure Has hx of non-ischemic cardiomyopathy noted on prior admission on 2017 w/ submassive PE w/ subsequent follow up R/L cath showing no significant CAD. W/ treatment had improvement in ED w/ most recent Echo on 10/2018 w/ EF 55-60%. Risk of getting volume overloaded in setting of continued diltiazem drip. - C/w monitor - Holding home meds in setting of diltiazem gtt - I/Os, daily weights  Diabetes Mellitus On lantus 15 units, glimepiride at home. cbgs since admission persistently elevated on sliding scale. - Start tradjenta per COVID treatment protocol - SSI - Start novolog 5 units TID qc - Resume home lantus 15 units qhs  Hx of Submassive pulmonary embolism Prolonged admission on 2017 w/  submassive PE. Has been on Xarelto. Cbc down trending 11.3->10.8 but no obvious sign of bleed - Monitor for bleed - C/w Xarelto  Chronic Kidney Disease stage 3 Has hx of CKD 3 w/ baseline GFR ~40-50s. (creatinine 1.4) Currently at baseline (Creatinine 1.33) - Avoid nephrotoxic meds when able - Trend renal fx  DVT prophx: Xarelto Diet: CM/HH Bowel:  Miralax Code: Full  Prior to Admission Living Arrangement: Home Anticipated Discharge Location: Home Barriers to Discharge: Medical treatment Dispo: Anticipated discharge in approximately 1-2 day(s).   Kaitlin Barrio, MD 12/07/2019, 3:56 PM Pager: [phone] After 5pm on weekdays and 1pm on weekends: On Call Pager: [phone]

## 2019-12-08 ENCOUNTER — Encounter (HOSPITAL_COMMUNITY): Payer: Self-pay | Admitting: Student in an Organized Health Care Education/Training Program

## 2019-12-08 LAB — GLUCOSE, CAPILLARY
Glucose-Capillary: 184 mg/dL — ABNORMAL HIGH (ref 70–99)
Glucose-Capillary: 253 mg/dL — ABNORMAL HIGH (ref 70–99)
Glucose-Capillary: 142 mg/dL — ABNORMAL HIGH (ref 70–99)
Glucose-Capillary: 154 mg/dL — ABNORMAL HIGH (ref 70–99)

## 2019-12-08 LAB — CBC WITH DIFFERENTIAL/PLATELET
Abs Immature Granulocytes: 0.18 10*3/uL — ABNORMAL HIGH (ref 0.00–0.07)
Basophils Absolute: 0 10*3/uL (ref 0.0–0.1)
Basophils Relative: 0 %
Eosinophils Absolute: 0.1 10*3/uL (ref 0.0–0.5)
Eosinophils Relative: 1 %
HCT: 31.6 % — ABNORMAL LOW (ref 36.0–46.0)
Hemoglobin: 9.7 g/dL — ABNORMAL LOW (ref 12.0–15.0)
Immature Granulocytes: 1 %
Lymphocytes Relative: 18 %
Lymphs Abs: 2.5 10*3/uL (ref 0.7–4.0)
MCH: 25.1 pg — ABNORMAL LOW (ref 26.0–34.0)
MCHC: 30.7 g/dL (ref 30.0–36.0)
MCV: 81.7 fL (ref 80.0–100.0)
Monocytes Absolute: 1.3 10*3/uL — ABNORMAL HIGH (ref 0.1–1.0)
Monocytes Relative: 9 %
Neutro Abs: 10 10*3/uL — ABNORMAL HIGH (ref 1.7–7.7)
Neutrophils Relative %: 71 %
Platelets: 370 10*3/uL (ref 150–400)
RBC: 3.87 MIL/uL (ref 3.87–5.11)
RDW: 15 % (ref 11.5–15.5)
WBC: 14.2 10*3/uL — ABNORMAL HIGH (ref 4.0–10.5)
nRBC: 0 % (ref 0.0–0.2)

## 2019-12-08 LAB — BASIC METABOLIC PANEL
Anion gap: 11 (ref 5–15)
BUN: 38 mg/dL — ABNORMAL HIGH (ref 8–23)
CO2: 24 mmol/L (ref 22–32)
Calcium: 8.9 mg/dL (ref 8.9–10.3)
Chloride: 95 mmol/L — ABNORMAL LOW (ref 98–111)
Creatinine, Ser: 1.46 mg/dL — ABNORMAL HIGH (ref 0.44–1.00)
GFR calc Af Amer: 44 mL/min — ABNORMAL LOW (ref >60)
GFR calc non Af Amer: 38 mL/min — ABNORMAL LOW (ref >60)
Glucose, Bld: 203 mg/dL — ABNORMAL HIGH (ref 70–99)
Potassium: 4.9 mmol/L (ref 3.5–5.1)
Sodium: 130 mmol/L — ABNORMAL LOW (ref 135–145)

## 2019-12-08 MED ORDER — INSULIN GLARGINE 100 UNIT/ML ~~LOC~~ SOLN
20.0000 [IU] | Freq: Every day | SUBCUTANEOUS | Status: DC
  Administered 2019-12-08 – 2019-12-10 (×3): 20 [IU] via SUBCUTANEOUS
  Filled 2019-12-08 (×4): qty 0.2

## 2019-12-08 MED ORDER — INSULIN ASPART 100 UNIT/ML ~~LOC~~ SOLN
8.0000 [IU] | Freq: Three times a day (TID) | SUBCUTANEOUS | Status: DC
  Administered 2019-12-08 – 2019-12-10 (×7): 8 [IU] via SUBCUTANEOUS

## 2019-12-08 MED ORDER — METOPROLOL TARTRATE 50 MG PO TABS
50.0000 mg | ORAL_TABLET | Freq: Two times a day (BID) | ORAL | Status: DC
  Administered 2019-12-08 – 2019-12-09 (×2): 50 mg via ORAL
  Filled 2019-12-08 (×2): qty 1

## 2019-12-08 NOTE — Progress Notes (Signed)
Subjective:  Kaitlin Cantrell is a 64 y.o. with PMH of DM, HTN, tobacco abuse, COPD, submassive PE, non-ischemic cardiomyopathy, systolic heart failure (EF 20-25->55-60 w/ treatment) admit for palpitations on hospital day 3  Kaitlin Cantrell was examined and evaluated at bedside this am. She denies any acute events overnight. Denies any new cheat pain, palpitations, dyspnea. Mentions speaking with Dr.Turner about possibly weaning off the diltiazem drip. Denies any shortness of breath chest pain or light-headedness.  Objective:  Vital signs in last 24 hours: Vitals:   12/07/19 2132 12/08/19 0021 12/08/19 0405 12/08/19 0829  BP:  100/81 114/63 130/82  Pulse: 81 73 76 87  Resp: (!) 22 16 18  (!) 23  Temp:  97.9 F (36.6 C) 97.7 F (36.5 C) 98 F (36.7 C)  TempSrc:  Oral Oral Oral  SpO2: 95% 95% 95% 94%  Weight:      Height:       Gen: NAD, resting in bed CV: Irregular rhythm, no murmurs, no gallops Resp: CTAB Extremities: No peripheral edema  Assessment/Plan:  Principal Problem:   Atrial flutter with rapid ventricular response (HCC) Active Problems:   HTN (hypertension)   Type 2 diabetes mellitus without complication, without long-term current use of insulin (HCC)   Chronic obstructive pulmonary disease (HCC)   COVID-19  Kaitlin Cantrell is a 64 y.o. with PMH of DM, HTN, tobacco abuse, COPD, submassive PE, non-ischemic cardiomyopathy, systolic heart failure (EF 20-25->55-60 w/ treatment) admit for new onset atrial flutter with RVR and COVID infection.  Leukocytosis Noted to have increasing leukocytosis w/ wbc 12.5->14.2->17.2->14.2. Unclear if sequelae of COVID infection or new super-imposing infection. Denies any significant symptoms suggesting infection. - Trend cbc - Monitor for now  Atrial Flutter with RVR Present w/ new onset arrhythmia. No prior history. Does have significant structural heart disease hx. Suspected inciting event due to COVID pneumonia. CHADSVASC2  score of 4 on Xarelto at home for PE. Metoprolol dose increased to 50mg  BID. Dilt drip needed to be increased to 77 yesterday prior to increased metoprolol dose. Plan to decrease tonight. BP stable.  - Appreciate cardiology recs: Increase metoprolol to 50mg  BID, wean off dilt, may need amio but has hx of lung disease w/ COVID - Wean off dilt gtt (goal hr <100) - Telemetry/BP checks - May need cardioversion or ablation if continues to be in A.Flutter  COVID infection COPD Inflammatory markers trending down. Symptoms likely mild due to vaccine. Currently on room air. No indication to start steroids/remdesivir - C/w dulera BID - C/w monitor for hypoxia - C/w airborne/contact precautions  Diabetes Mellitus On lantus 15 units, glimepiride at home. Titrated up to home dose yesterday but continues to be hyperglycemic. Am cbg 184. - C/w tradjenta per COVID treatment protocol - SSI - Increase novolog to 8 units TID qc - Increase lantus to 20 units qhs  Non-ischemic cardiomyopathy Chronic systolic heart failure Has hx of non-ischemic cardiomyopathy noted on prior admission on 2017 w/ submassive PE w/ subsequent follow up R/L cath showing no significant CAD. W/ treatment had improvement in ED w/ most recent Echo on 10/2018 w/ EF 55-60%. Risk of getting volume overloaded in setting of continued diltiazem drip. - C/w monitor - Holding home meds in setting of diltiazem gtt - I/Os, daily weights  Chronic Kidney Disease stage 3 Has hx of CKD 3 w/ baseline GFR ~40-50s. (creatinine 1.4) Currently at baseline (Creatinine 1.46) - Avoid nephrotoxic meds when able - Trend renal fx  DVT prophx:  Xarelto Diet: CM/HH Bowel: Miralax Code: Full  Prior to Admission Living Arrangement: Home Anticipated Discharge Location: Home Barriers to Discharge: Medical treatment Dispo: Anticipated discharge in approximately 1-2 day(s).   Theotis Barrio, MD 12/08/2019, 12:32 PM Pager:  [phone] After 5pm on weekdays and 1pm on weekends: On Call Pager: [phone]

## 2019-12-08 NOTE — Progress Notes (Signed)
   12/07/19 2130  Mobility  Activity Ambulated in room;Ambulated to bathroom  Level of Assistance Standby assist, set-up cues, supervision of patient - no hands on  Assistive Device None  Distance Ambulated (ft) 35 ft  Mobility Response Tolerated fair   HR increased to as high as 149, but did not sustain. SpO2 as low as 93%on RA. Patient resting in bed at this time. Will continue to monitor.

## 2019-12-08 NOTE — Progress Notes (Signed)
Progress Note  Due to the COVID-19 pandemic, this visit was completed with telemedicine (audio/video) technology to reduce patient and provider exposure as well as to preserve personal protective equipment.   Patient Name: Kaitlin Cantrell Date of Encounter: 12/08/2019  Primary Cardiologist: Marca Ancona, MD  Subjective   HR improved and remains in afib with HR in 80-100's  Inpatient Medications    Scheduled Meds: . atorvastatin  80 mg Oral q1800  . Chlorhexidine Gluconate Cloth  6 each Topical Q0600  . gabapentin  300 mg Oral TID  . insulin aspart  0-20 Units Subcutaneous TID WC  . insulin aspart  0-5 Units Subcutaneous QHS  . insulin aspart  5 Units Subcutaneous TID WC  . insulin glargine  15 Units Subcutaneous QHS  . linagliptin  5 mg Oral Daily  . metoprolol tartrate  50 mg Oral BID  . mometasone-formoterol  2 puff Inhalation BID  . mupirocin ointment  1 application Nasal BID  . rivaroxaban  20 mg Oral Daily   Continuous Infusions: . diltiazem (CARDIZEM) infusion 10 mg/hr (12/08/19 0800)   PRN Meds: alum & mag hydroxide-simeth, cyclobenzaprine, HYDROcodone-acetaminophen, polyethylene glycol   Vital Signs    Vitals:   12/07/19 2132 12/08/19 0021 12/08/19 0405 12/08/19 0829  BP:  100/81 114/63 130/82  Pulse: 81 73 76 87  Resp: (!) 22 16 18  (!) 23  Temp:  97.9 F (36.6 C) 97.7 F (36.5 C) 98 F (36.7 C)  TempSrc:  Oral Oral Oral  SpO2: 95% 95% 95% 94%  Weight:      Height:        Intake/Output Summary (Last 24 hours) at 12/08/2019 0936 Last data filed at 12/08/2019 0855 Gross per 24 hour  Intake 1329.95 ml  Output --  Net 1329.95 ml   Last 3 Weights 12/05/2019 11/14/2018 03/10/2018  Weight (lbs) 253 lb 12 oz 253 lb 12.8 oz 246 lb 3.2 oz  Weight (kg) 115.1 kg 115.123 kg 111.676 kg      Telemetry    Atrial fibrillation with HR 80-100 - Personally Reviewed  ECG    No new EKG to review - Personally Reviewed  Physical Exam   PE not performed due to  COVID 19 active infection   Labs    Chemistry Recent Labs  Lab 12/05/19 0616 12/05/19 0827 12/06/19 0345 12/07/19 0447 12/08/19 0443  NA   < >  --  129* 127* 130*  K   < >  --  5.1 4.8 4.9  CL   < >  --  96* 92* 95*  CO2   < >  --  20* 23 24  GLUCOSE   < >  --  328* 274* 203*  BUN   < >  --  29* 33* 38*  CREATININE   < >  --  1.33* 1.40* 1.46*  CALCIUM   < >  --  9.4 9.3 8.9  PROT  --  7.7 7.2  --   --   ALBUMIN  --  2.9* 2.5*  --   --   AST  --  15 22  --   --   ALT  --  9 9  --   --   ALKPHOS  --  78 77  --   --   BILITOT  --  0.7 0.9  --   --   GFRNONAA   < >  --  43* 40* 38*  GFRAA   < >  --  50* 47* 44*  ANIONGAP   < >  --  13 12 11    < > = values in this interval not displayed.     Hematology Recent Labs  Lab 12/06/19 0345 12/07/19 0447 12/08/19 0443  WBC 14.2* 17.2* 14.2*  RBC 4.47 4.19 3.87  HGB 11.3* 10.8* 9.7*  HCT 36.7 34.2* 31.6*  MCV 82.1 81.6 81.7  MCH 25.3* 25.8* 25.1*  MCHC 30.8 31.6 30.7  RDW 14.7 14.7 15.0  PLT 468* 500* 370    Cardiac EnzymesNo results for input(s): TROPONINI in the last 168 hours. No results for input(s): TROPIPOC in the last 168 hours.   BNPNo results for input(s): BNP, PROBNP in the last 168 hours.   DDimer  Recent Labs  Lab 12/05/19 0827 12/06/19 0345  DDIMER 1.75* 1.40*     Radiology    No results found.  Cardiac Studies   2D echo 10/2018 IMPRESSIONS    1. The left ventricle has normal systolic function, with an ejection  fraction of 55-60%. The cavity size was normal. Left ventricular diastolic  Doppler parameters are consistent with pseudonormalization. No evidence of  left ventricular regional wall  motion abnormalities. GLS -23.1%.  2. The right ventricle has normal systolic function. The cavity was  normal. There is no increase in right ventricular wall thickness.  3. No evidence of mitral valve stenosis. Trivial mitral regurgitation.  4. The aortic valve is tricuspid. Mild calcification  of the aortic valve.  No stenosis of the aortic valve.  5. The aortic root is normal in size and structure.  6. The IVC is normal in size. No complete TR doppler jet so unable to  estimate PA systolic pressure.   Patient Profile     64 y.o. female with a hx of cardiomyopathy who is being seen today for the evaluation of atrial flutter  at the request of Dr 68   Assessment & Plan    1  Atrial flutter/fibrillation with RVR -increased Lopressor to 50mg  BID for better HR control yesterday with better HR control now in the 80-100's -continue IV Cardizem gtt and Lopressor 50mg  BID - In setting of COPD, would like to avoid Amio  -The pt has been on Xarelto and has not missed any doses over the past month.    2  Hx NICM    -Last echo in July 2020 LVEF normal   -Clinically pt was doing OK until just recently   -CXR without CHF or cardiomegaly   3  COVID 19 + -per TRH  4  HTN   -BP controlled at 130/98mmHg -continue lopressor and Cardizem  5  Hx COPD  6 Hx of PE in past  - SUbmassive  - Pt continues on Xarelto for this    7 Elevated trop - Tropoinin trivially elevated at 25 prob due to tachycardia as well as COVID 19 +    For questions or updates, please contact [facility] Please consult [email or web address] for contact info under        Signed, , MD  12/08/2019, 9:36 AM

## 2019-12-09 DIAGNOSIS — Z794 Long term (current) use of insulin: Secondary | ICD-10-CM

## 2019-12-09 DIAGNOSIS — I8393 Asymptomatic varicose veins of bilateral lower extremities: Secondary | ICD-10-CM

## 2019-12-09 LAB — BASIC METABOLIC PANEL
Anion gap: 11 (ref 5–15)
BUN: 35 mg/dL — ABNORMAL HIGH (ref 8–23)
CO2: 25 mmol/L (ref 22–32)
Calcium: 9.1 mg/dL (ref 8.9–10.3)
Chloride: 97 mmol/L — ABNORMAL LOW (ref 98–111)
Creatinine, Ser: 1.44 mg/dL — ABNORMAL HIGH (ref 0.44–1.00)
GFR calc Af Amer: 45 mL/min — ABNORMAL LOW (ref >60)
GFR calc non Af Amer: 39 mL/min — ABNORMAL LOW (ref >60)
Glucose, Bld: 204 mg/dL — ABNORMAL HIGH (ref 70–99)
Potassium: 5.3 mmol/L — ABNORMAL HIGH (ref 3.5–5.1)
Sodium: 133 mmol/L — ABNORMAL LOW (ref 135–145)

## 2019-12-09 LAB — CBC
HCT: 31.6 % — ABNORMAL LOW (ref 36.0–46.0)
Hemoglobin: 9.6 g/dL — ABNORMAL LOW (ref 12.0–15.0)
MCH: 25.1 pg — ABNORMAL LOW (ref 26.0–34.0)
MCHC: 30.4 g/dL (ref 30.0–36.0)
MCV: 82.5 fL (ref 80.0–100.0)
Platelets: 340 10*3/uL (ref 150–400)
RBC: 3.83 MIL/uL — ABNORMAL LOW (ref 3.87–5.11)
RDW: 15 % (ref 11.5–15.5)
WBC: 11.9 10*3/uL — ABNORMAL HIGH (ref 4.0–10.5)
nRBC: 0 % (ref 0.0–0.2)

## 2019-12-09 LAB — GLUCOSE, CAPILLARY
Glucose-Capillary: 191 mg/dL — ABNORMAL HIGH (ref 70–99)
Glucose-Capillary: 268 mg/dL — ABNORMAL HIGH (ref 70–99)
Glucose-Capillary: 156 mg/dL — ABNORMAL HIGH (ref 70–99)
Glucose-Capillary: 94 mg/dL (ref 70–99)

## 2019-12-09 MED ORDER — METOPROLOL TARTRATE 50 MG PO TABS
50.0000 mg | ORAL_TABLET | Freq: Three times a day (TID) | ORAL | Status: DC
  Administered 2019-12-09 – 2019-12-10 (×3): 50 mg via ORAL
  Filled 2019-12-09 (×3): qty 1

## 2019-12-09 NOTE — Progress Notes (Signed)
Subjective:  Kaitlin Cantrell is a 64 y.o. with PMH of DM, HTN, tobacco abuse, COPD, submassive PE, non-ischemic cardiomyopathy, systolic heart failure (EF 20-25->55-60 w/ treatment) admit for palpitations on hospital day 3  Kaitlin Cantrell was examined and evaluated at bedside this am. She mentions not experiencing any palpitations despite her HR spiking up last evening. She mentions she feels well without any chest pain, palpitations. She is endorsing cough but denies any dyspnea or orthopnea  Objective:  Vital signs in last 24 hours: Vitals:   12/09/19 0333 12/09/19 0400 12/09/19 0720 12/09/19 0800  BP:  114/66 121/60 114/61  Pulse: 74 74 83 87  Resp: 17 18 13  (!) 23  Temp: 97.7 F (36.5 C)  98.3 F (36.8 C) 98.6 F (37 C)  TempSrc: Oral  Oral Oral  SpO2: 96% 96% 97% 90%  Weight:      Height:       Physical Exam Constitutional:      General: She is not in acute distress.    Appearance: She is well-developed. She is not ill-appearing.  Cardiovascular:     Rate and Rhythm: Normal rate. Rhythm irregular.     Heart sounds: No murmur heard.  No gallop.   Musculoskeletal:     Comments: Bilateral varicose veins  Skin:    General: Skin is warm and dry.  Neurological:     Mental Status: She is alert.    Assessment/Plan:  Principal Problem:   Atrial flutter with rapid ventricular response (HCC) Active Problems:   HTN (hypertension)   Type 2 diabetes mellitus without complication, without long-term current use of insulin (HCC)   Chronic obstructive pulmonary disease (HCC)   COVID-19  Kaitlin Cantrell is a 64 y.o. with PMH of DM, HTN, tobacco abuse, COPD, submassive PE, non-ischemic cardiomyopathy, systolic heart failure (EF 20-25->55-60 w/ treatment) admit for new onset atrial flutter with RVR and COVID infection.  Atrial Flutter with RVR Present w/ new onset arrhythmia. No prior history. Does have significant structural heart disease hx. Suspected inciting event due  to COVID pneumonia. CHADSVASC2 score of 4 on Xarelto at home for PE. Had increased HR when diltiazem drip was down-titrated but has been stable around 70-80s.  - Appreciate cardiology recs: Increase metoprolol to 50mg  TID, d/c dilt - Telemetry/BP checks - May need cardioversion or ablation if continues to be in A.Flutter - likely will need f/u outpatient for ablation after COVID pre-cautions over  COVID infection COPD Inflammatory markers trending down. Continues to be asymptomatic from COVID, likely due to vaccination status with 77 and [place]. C/w observe - C/w dulera BID - C/w monitor for hypoxia - C/w airborne/contact precautions  Diabetes Mellitus On lantus 15 units, glimepiride at home. Better control w/  - C/w tradjenta per COVID treatment protocol - SSI - C/w novolog to 8 units TID qc - C/w lantus to 20 units qhs  Non-ischemic cardiomyopathy Chronic systolic heart failure Has hx of non-ischemic cardiomyopathy noted on prior admission on 2017 w/ submassive PE w/ subsequent follow up R/L cath showing no significant CAD. W/ treatment had improvement in ED w/ most recent Echo on 10/2018 w/ EF 55-60%. - C/w monitor - Holding home meds in setting of diltiazem gtt - I/Os, daily weights  Chronic Kidney Disease stage 3 Has hx of CKD 3 w/ baseline GFR ~40-50s. (creatinine 1.4) Currently at baseline (Creatinine 1.46) - Avoid nephrotoxic meds when able - Trend renal fx   DVT prophx: Xarelto Diet: CM/HH Bowel: Miralax Code: Full  Prior to Admission Living Arrangement: Home Anticipated Discharge Location: Home Barriers to Discharge: Medical treatment Dispo: Anticipated discharge in approximately 1-2 day(s).   Theotis Barrio, MD 12/09/2019, 11:00 AM Pager: [phone] After 5pm on weekdays and 1pm on weekends: On Call Pager: [phone]

## 2019-12-09 NOTE — Plan of Care (Signed)
  Problem: Education: Goal: Knowledge of General Education information will improve Description: Including pain rating scale, medication(s)/side effects and non-pharmacologic comfort measures Outcome: Progressing   Problem: Health Behavior/Discharge Planning: Goal: Ability to manage health-related needs will improve Outcome: Progressing   Problem: Clinical Measurements: Goal: Ability to maintain clinical measurements within normal limits will improve Outcome: Progressing Goal: Will remain free from infection Outcome: Progressing Goal: Diagnostic test results will improve Outcome: Progressing Goal: Respiratory complications will improve Outcome: Progressing Goal: Cardiovascular complication will be avoided Outcome: Progressing   Problem: Activity: Goal: Risk for activity intolerance will decrease Outcome: Progressing   Problem: Nutrition: Goal: Adequate nutrition will be maintained Outcome: Progressing   Problem: Coping: Goal: Level of anxiety will decrease Outcome: Progressing   Problem: Elimination: Goal: Will not experience complications related to bowel motility Outcome: Progressing Goal: Will not experience complications related to urinary retention Outcome: Progressing   Problem: Pain Managment: Goal: General experience of comfort will improve Outcome: Progressing   Problem: Safety: Goal: Ability to remain free from injury will improve Outcome: Progressing   Problem: Skin Integrity: Goal: Risk for impaired skin integrity will decrease Outcome: Progressing   Problem: Education: Goal: Knowledge of risk factors and measures for prevention of condition will improve Outcome: Progressing   Problem: Coping: Goal: Psychosocial and spiritual needs will be supported Outcome: Progressing   Problem: Respiratory: Goal: Will maintain a patent airway Outcome: Progressing Goal: Complications related to the disease process, condition or treatment will be avoided or  minimized Outcome: Progressing   Problem: Education: Goal: Ability to demonstrate management of disease process will improve Outcome: Progressing Goal: Ability to verbalize understanding of medication therapies will improve Outcome: Progressing Goal: Individualized Educational Video(s) Outcome: Progressing   Problem: Activity: Goal: Capacity to carry out activities will improve Outcome: Progressing   Problem: Cardiac: Goal: Ability to achieve and maintain adequate cardiopulmonary perfusion will improve Outcome: Progressing   Problem: Education: Goal: Knowledge of disease or condition will improve Outcome: Progressing Goal: Understanding of medication regimen will improve Outcome: Progressing Goal: Individualized Educational Video(s) Outcome: Progressing   Problem: Activity: Goal: Ability to tolerate increased activity will improve Outcome: Progressing   Problem: Cardiac: Goal: Ability to achieve and maintain adequate cardiopulmonary perfusion will improve Outcome: Progressing   Problem: Health Behavior/Discharge Planning: Goal: Ability to safely manage health-related needs after discharge will improve Outcome: Progressing

## 2019-12-09 NOTE — Progress Notes (Signed)
   12/07/19 0800  Assess: MEWS Score  Temp 97.7 F (36.5 C)  BP (!) 116/59  Pulse Rate 95  ECG Heart Rate (!) 117  Resp 20  SpO2 97 %  O2 Device Room Air  Assess: MEWS Score  MEWS Temp 0  MEWS Systolic 0  MEWS Pulse 2  MEWS RR 0  MEWS LOC 0  MEWS Score 2  MEWS Score Color Yellow  Assess: if the MEWS score is Yellow or Red  Were vital signs taken at a resting state? Yes  Focused Assessment No change from prior assessment  Early Detection of Sepsis Score *See Row Information* Medium  MEWS guidelines implemented *See Row Information* Yes  Treat  MEWS Interventions Administered scheduled meds/treatments  Pain Scale 0-10  Pain Score 2  Take Vital Signs  Increase Vital Sign Frequency  Yellow: Q 2hr X 2 then Q 4hr X 2, if remains yellow, continue Q 4hrs  Escalate  MEWS: Escalate Yellow: discuss with charge nurse/RN and consider discussing with provider and RRT  Notify: Charge Nurse/RN  Name of Charge Nurse/RN Notified cassidy RN  Date Charge Nurse/RN Notified 12/07/19  Time Charge Nurse/RN Notified 0820

## 2019-12-09 NOTE — Progress Notes (Signed)
   12/09/19 2000  Assess: MEWS Score  Temp 97.8 F (36.6 C)  BP (!) 116/59  Pulse Rate (!) 49  ECG Heart Rate 94  Resp 20  Level of Consciousness Alert  SpO2 92 %  O2 Device Room Air  Patient Activity (if Appropriate) In bed  Assess: MEWS Score  MEWS Temp 0  MEWS Systolic 0  MEWS Pulse 0  MEWS RR 0  MEWS LOC 0  MEWS Score 0  MEWS Score Color Green  Assess: if the MEWS score is Yellow or Red  Were vital signs taken at a resting state? Yes  Focused Assessment No change from prior assessment  Early Detection of Sepsis Score *See Row Information* Low  MEWS guidelines implemented *See Row Information* No, other (Comment) (no acute changes)  Treat  Pain Scale 0-10  Pain Score 3  Pain Type Chronic pain  Pain Location Back  Pain Orientation Lower  Pain Descriptors / Indicators Aching  Pain Frequency Constant  Pain Onset Gradual  Patients Stated Pain Goal 2  Pain Intervention(s) Repositioned  Document  Progress note created (see row info) Yes

## 2019-12-09 NOTE — Progress Notes (Signed)
Progress Note  Patient Name: Kaitlin Cantrell Date of Encounter: 12/09/2019  Primary Cardiologist:   No primary care provider on file.   Subjective   She thinks her breathing is not quite as good as it was.  She has cough today.    Inpatient Medications    Scheduled Meds: . atorvastatin  80 mg Oral q1800  . Chlorhexidine Gluconate Cloth  6 each Topical Q0600  . gabapentin  300 mg Oral TID  . insulin aspart  0-20 Units Subcutaneous TID WC  . insulin aspart  0-5 Units Subcutaneous QHS  . insulin aspart  8 Units Subcutaneous TID WC  . insulin glargine  20 Units Subcutaneous QHS  . linagliptin  5 mg Oral Daily  . metoprolol tartrate  50 mg Oral BID  . mometasone-formoterol  2 puff Inhalation BID  . mupirocin ointment  1 application Nasal BID  . rivaroxaban  20 mg Oral Daily   Continuous Infusions: . diltiazem (CARDIZEM) infusion 12.5 mg/hr (12/09/19 0433)   PRN Meds: alum & mag hydroxide-simeth, cyclobenzaprine, HYDROcodone-acetaminophen, polyethylene glycol   Vital Signs    Vitals:   12/09/19 0333 12/09/19 0400 12/09/19 0720 12/09/19 0800  BP:  114/66 121/60 114/61  Pulse: 74 74 83 87  Resp: 17 18 13  (!) 23  Temp: 97.7 F (36.5 C)  98.3 F (36.8 C) 98.6 F (37 C)  TempSrc: Oral  Oral Oral  SpO2: 96% 96% 97% 90%  Weight:      Height:        Intake/Output Summary (Last 24 hours) at 12/09/2019 02/08/2020 Last data filed at 12/08/2019 1600 Gross per 24 hour  Intake 542.15 ml  Output --  Net 542.15 ml   Filed Weights   12/05/19 1116  Weight: 115.1 kg    Telemetry    Atrial fib with rates for the most part below 100 - Personally Reviewed  ECG    NA - Personally Reviewed  Physical Exam   GEN: No acute distress.   Neck: No  JVD Cardiac: Irregular RR, no murmurs, rubs, or gallops.  Respiratory:     Decreased breath sounds. GI: Soft, nontender, non-distended  MS: No  edema; No deformity. Neuro:  Nonfocal  Psych: Normal affect   Labs     Chemistry Recent Labs  Lab 12/05/19 02/04/20 12/05/19 0827 12/06/19 0345 12/06/19 0345 12/07/19 0447 12/08/19 0443 12/09/19 0445  NA   < >  --  129*   < > 127* 130* 133*  K   < >  --  5.1   < > 4.8 4.9 5.3*  CL   < >  --  96*   < > 92* 95* 97*  CO2   < >  --  20*   < > 23 24 25   GLUCOSE   < >  --  328*   < > 274* 203* 204*  BUN   < >  --  29*   < > 33* 38* 35*  CREATININE   < >  --  1.33*   < > 1.40* 1.46* 1.44*  CALCIUM   < >  --  9.4   < > 9.3 8.9 9.1  PROT  --  7.7 7.2  --   --   --   --   ALBUMIN  --  2.9* 2.5*  --   --   --   --   AST  --  15 22  --   --   --   --  ALT  --  9 9  --   --   --   --   ALKPHOS  --  78 77  --   --   --   --   BILITOT  --  0.7 0.9  --   --   --   --   GFRNONAA   < >  --  43*   < > 40* 38* 39*  GFRAA   < >  --  50*   < > 47* 44* 45*  ANIONGAP   < >  --  13   < > 12 11 11    < > = values in this interval not displayed.     Hematology Recent Labs  Lab 12/07/19 0447 12/08/19 0443 12/09/19 0445  WBC 17.2* 14.2* 11.9*  RBC 4.19 3.87 3.83*  HGB 10.8* 9.7* 9.6*  HCT 34.2* 31.6* 31.6*  MCV 81.6 81.7 82.5  MCH 25.8* 25.1* 25.1*  MCHC 31.6 30.7 30.4  RDW 14.7 15.0 15.0  PLT 500* 370 340    Cardiac EnzymesNo results for input(s): TROPONINI in the last 168 hours. No results for input(s): TROPIPOC in the last 168 hours.   BNPNo results for input(s): BNP, PROBNP in the last 168 hours.   DDimer  Recent Labs  Lab 12/05/19 0827 12/06/19 0345  DDIMER 1.75* 1.40*     Radiology    No results found.  Cardiac Studies   2D echo 10/2018   1. The left ventricle has normal systolic function, with an ejection  fraction of 55-60%. The cavity size was normal. Left ventricular diastolic  Doppler parameters are consistent with pseudonormalization. No evidence of  left ventricular regional wall  motion abnormalities. GLS -23.1%.  2. The right ventricle has normal systolic function. The cavity was  normal. There is no increase in right  ventricular wall thickness.  3. No evidence of mitral valve stenosis. Trivial mitral regurgitation.  4. The aortic valve is tricuspid. Mild calcification of the aortic valve.  No stenosis of the aortic valve.  5. The aortic root is normal in size and structure.  6. The IVC is normal in size. No complete TR doppler jet so unable to  estimate PA systolic pressure.    Patient Profile     64 y.o. female with a hx of cardiomyopathywho is being seen for the evaluation ofatrial flutterat the request of Dr 68  Assessment & Plan    ATRIAL FLUTTER:  Rate is better controlled. Her beta blocker dose was increased.  I will increase to TID timing and discontinue the Cardizem IV.    NICM/CHRONIC SYSTOLIC AND DIASTOLIC HF:  Seems to be euvolemic.    COVID 19:  Per primary team.    HTN:  BP is controlled.    ELEVATED TROPONIN:  Probable demand ischemia.      For questions or updates, please contact [facility] Please consult [email or web address] for contact info under Cardiology/STEMI.   Signed, Oswaldo Done, MD  12/09/2019, 8:08 AM

## 2019-12-10 LAB — GLUCOSE, CAPILLARY
Glucose-Capillary: 202 mg/dL — ABNORMAL HIGH (ref 70–99)
Glucose-Capillary: 243 mg/dL — ABNORMAL HIGH (ref 70–99)
Glucose-Capillary: 85 mg/dL (ref 70–99)
Glucose-Capillary: 162 mg/dL — ABNORMAL HIGH (ref 70–99)

## 2019-12-10 LAB — CULTURE, BLOOD (ROUTINE X 2)
Culture: NO GROWTH
Culture: NO GROWTH
Special Requests: ADEQUATE

## 2019-12-10 MED ORDER — METOPROLOL SUCCINATE ER 100 MG PO TB24
200.0000 mg | ORAL_TABLET | Freq: Every day | ORAL | Status: DC
  Administered 2019-12-10 – 2019-12-11 (×2): 200 mg via ORAL
  Filled 2019-12-10 (×2): qty 2

## 2019-12-10 MED ORDER — INSULIN ASPART 100 UNIT/ML ~~LOC~~ SOLN
10.0000 [IU] | Freq: Three times a day (TID) | SUBCUTANEOUS | Status: DC
  Administered 2019-12-10 – 2019-12-11 (×3): 10 [IU] via SUBCUTANEOUS

## 2019-12-10 NOTE — Progress Notes (Signed)
Subjective:  Kaitlin Cantrell is a 64 y.o. with PMH of DM, HTN, tobacco abuse, COPD, submassive PE, non-ischemic cardiomyopathy, systolic heart failure (EF 20-25->55-60 w/ treatment) admit for palpitations on hospital day 3  Kaitlin Cantrell was examined and evaluated at bedside this am. She mentions endorsing some cough but denies any chest pain or palpitations. Discussed telemetry findings this morning with HR in RVR for about an hour. She mentions washing herself at that time which may have caused her HR to spike.   Objective:  Vital signs in last 24 hours: Vitals:   12/10/19 0500 12/10/19 0809 12/10/19 1019 12/10/19 1158  BP: (!) 153/71 110/78 131/68 122/87  Pulse: 88 (!) 144 (!) 114 98  Resp: 18 17 17 19   Temp: 98.5 F (36.9 C) 98.4 F (36.9 C) 98 F (36.7 C) 98.8 F (37.1 C)  TempSrc: Oral Oral Oral Oral  SpO2: 96% 98% 98% 95%  Weight:      Height:       Gen: Well-developed, well nourished, NAD CV: Irregularly irregular, no murmurs, s1 s2 wnl Pulm: Breathing comfortably on room air Extm: ROM intact, No peripheral edema Skin: Dry, Warm, normal turgor Psych: Normal mood and affect   Assessment/Plan:  Principal Problem:   Atrial flutter with rapid ventricular response (HCC) Active Problems:   HTN (hypertension)   Type 2 diabetes mellitus without complication, without long-term current use of insulin (HCC)   Chronic obstructive pulmonary disease (HCC)   COVID-19  Kaitlin Cantrell is a 64 y.o. with PMH of DM, HTN, tobacco abuse, COPD, submassive PE, non-ischemic cardiomyopathy, systolic heart failure (EF 20-25->55-60 w/ treatment) admit for new onset atrial flutter with RVR and COVID infection.  Atrial Flutter with RVR Present w/ new onset arrhythmia. No prior history. Does have significant structural heart disease hx. Suspected inciting event due to COVID pneumonia. CHADSVASC2 score of 4 on Xarelto at home for PE. Have been off dilt gtt for 24 hours. Had episodes of  RVR about an hour prior to her next dose of metoprolol tartrate. Likely would benefit from switching to long-acting (metoprolol succinate) - Appreciate cardiology recs: Switch to metoprolol succinate 200mg , d/c tomorrow if stable - Telemetry/BP checks - May need cardioversion or ablation if continues to be in A.Flutter - likely will need f/u outpatient for ablation after COVID pre-cautions over  COVID infection COPD Inflammatory markers trending down. Continues to be asymptomatic from COVID, likely due to vaccination status with [place] and [place]. C/w observe - C/w dulera BID - C/w monitor for hypoxia - C/w airborne/contact precautions (should be off pre-cautions 12/19/19)  Diabetes Mellitus On lantus 15 units, glimepiride at home. Better control w/ increased doses with novolog 8 units TID qc and 20 units qhs. CBG 94 overnight - C/w tradjenta per COVID treatment protocol - SSI - C/w novolog to 8 units TID qc - C/w lantus to 20 units qhs  Non-ischemic cardiomyopathy Chronic systolic heart failure Has hx of non-ischemic cardiomyopathy noted on prior admission on 2017 w/ submassive PE w/ subsequent follow up R/L cath showing no significant CAD. W/ treatment had improvement in ED w/ most recent Echo on 10/2018 w/ EF 55-60%. - C/w monitor - Holding home meds in setting of diltiazem gtt - I/Os, daily weights  DVT prophx: Xarelto Diet: CM/HH Bowel: Miralax Code: Full  Prior to Admission Living Arrangement: Home Anticipated Discharge Location: Home Barriers to Discharge: Medical treatment Dispo: Anticipated discharge in approximately 1-2 day(s).   2018, MD 12/10/2019, 1:44 PM  Pager: [phone] After 5pm on weekdays and 1pm on weekends: On Call Pager: [phone]

## 2019-12-10 NOTE — Progress Notes (Signed)
   12/10/19 0809  Assess: MEWS Score  BP 110/78  Pulse Rate (!) 144  ECG Heart Rate (!) 143  Resp 17  Level of Consciousness Alert  SpO2 98 %  Assess: MEWS Score  MEWS Temp 0  MEWS Systolic 0  MEWS Pulse 3  MEWS RR 0  MEWS LOC 0  MEWS Score 3  MEWS Score Color Yellow  Assess: if the MEWS score is Yellow or Red  Were vital signs taken at a resting state? No  Focused Assessment No change from prior assessment  Early Detection of Sepsis Score *See Row Information* Low  MEWS guidelines implemented *See Row Information* Yes  Treat  Pain Scale 0-10  Pain Score 0  Take Vital Signs  Increase Vital Sign Frequency  Yellow: Q 2hr X 2 then Q 4hr X 2, if remains yellow, continue Q 4hrs  Escalate  MEWS: Escalate Yellow: discuss with charge nurse/RN and consider discussing with provider and RRT  Notify: Charge Nurse/RN  Name of Charge Nurse/RN Notified Probation officer RN  Date Charge Nurse/RN Notified 12/10/19  Time Charge Nurse/RN Notified 0809  Notify: Provider  Provider Name/Title Dr. Marijo Conception   Date Provider Notified 12/10/19  Time Provider Notified 0840  Notification Type Page  Notification Reason Change in status (to make aware of increased HR)  Response No new orders  Date of Provider Response 12/10/19  Time of Provider Response [phone]  Document  Progress note created (see row info) Yes

## 2019-12-10 NOTE — Progress Notes (Signed)
Cardiology Progress Note  Patient ID: Kaitlin Cantrell MRN: [id number] DOB: 01/13/1956 Date of Encounter: 12/10/2019  Primary Cardiologist: No primary care provider on file.  Subjective   Chief Complaint: SOB with Afib with RVR.   HPI: Episode of RVR overnight. Improved with medication this AM.   ROS:  All other ROS reviewed and negative. Pertinent positives noted in the HPI.     Inpatient Medications  Scheduled Meds: . atorvastatin  80 mg Oral q1800  . gabapentin  300 mg Oral TID  . insulin aspart  0-20 Units Subcutaneous TID WC  . insulin aspart  0-5 Units Subcutaneous QHS  . insulin aspart  8 Units Subcutaneous TID WC  . insulin glargine  20 Units Subcutaneous QHS  . linagliptin  5 mg Oral Daily  . metoprolol succinate  200 mg Oral Daily  . mometasone-formoterol  2 puff Inhalation BID  . mupirocin ointment  1 application Nasal BID  . rivaroxaban  20 mg Oral Daily   Continuous Infusions:  PRN Meds: alum & mag hydroxide-simeth, cyclobenzaprine, HYDROcodone-acetaminophen, polyethylene glycol   Vital Signs   Vitals:   12/09/19 2000 12/10/19 0500 12/10/19 0809 12/10/19 1019  BP: (!) 116/59 (!) 153/71 110/78 131/68  Pulse: (!) 49 88 (!) 144 (!) 114  Resp: 20 18 17 17   Temp: 97.8 F (36.6 C) 98.5 F (36.9 C) 98.4 F (36.9 C) 98 F (36.7 C)  TempSrc: Oral Oral Oral Oral  SpO2: 92% 96% 98% 98%  Weight:      Height:        Intake/Output Summary (Last 24 hours) at 12/10/2019 1129 Last data filed at 12/10/2019 0900 Gross per 24 hour  Intake 566.62 ml  Output --  Net 566.62 ml   Last 3 Weights 12/05/2019 11/14/2018 03/10/2018  Weight (lbs) 253 lb 12 oz 253 lb 12.8 oz 246 lb 3.2 oz  Weight (kg) 115.1 kg 115.123 kg 111.676 kg      Telemetry  Overnight telemetry shows Afib with RVR up to 150s, which I personally reviewed.   ECG  The most recent ECG shows Aflutter 103 bpm, which I personally reviewed.   Physical Exam   Vitals:   12/09/19 2000 12/10/19 0500  12/10/19 0809 12/10/19 1019  BP: (!) 116/59 (!) 153/71 110/78 131/68  Pulse: (!) 49 88 (!) 144 (!) 114  Resp: 20 18 17 17   Temp: 97.8 F (36.6 C) 98.5 F (36.9 C) 98.4 F (36.9 C) 98 F (36.7 C)  TempSrc: Oral Oral Oral Oral  SpO2: 92% 96% 98% 98%  Weight:      Height:         Intake/Output Summary (Last 24 hours) at 12/10/2019 1129 Last data filed at 12/10/2019 0900 Gross per 24 hour  Intake 566.62 ml  Output --  Net 566.62 ml    Last 3 Weights 12/05/2019 11/14/2018 03/10/2018  Weight (lbs) 253 lb 12 oz 253 lb 12.8 oz 246 lb 3.2 oz  Weight (kg) 115.1 kg 115.123 kg 111.676 kg    Body mass index is 41.27 kg/m.  General: Well nourished, well developed, in no acute distress Head: Atraumatic, normal size  Eyes: PEERLA, EOMI  Neck: Supple, no JVD Endocrine: No thryomegaly Cardiac: Normal S1, S2; irregular rhythm Lungs: rhonchi bilaterally  Abd: Soft, nontender, no hepatomegaly  Ext: No edema, pulses 2+ Musculoskeletal: No deformities, BUE and BLE strength normal and equal Skin: Warm and dry, no rashes   Neuro: Alert and oriented to person, place, time, and situation, CNII-XII  grossly intact, no focal deficits  Psych: Normal mood and affect   Labs  High Sensitivity Troponin:   Recent Labs  Lab 12/05/19 0925 12/05/19 1439  TROPONINIHS 27* 25*     Cardiac EnzymesNo results for input(s): TROPONINI in the last 168 hours. No results for input(s): TROPIPOC in the last 168 hours.  Chemistry Recent Labs  Lab 12/05/19 6962 12/05/19 0827 12/06/19 0345 12/06/19 0345 12/07/19 0447 12/08/19 0443 12/09/19 0445  NA   < >  --  129*   < > 127* 130* 133*  K   < >  --  5.1   < > 4.8 4.9 5.3*  CL   < >  --  96*   < > 92* 95* 97*  CO2   < >  --  20*   < > 23 24 25   GLUCOSE   < >  --  328*   < > 274* 203* 204*  BUN   < >  --  29*   < > 33* 38* 35*  CREATININE   < >  --  1.33*   < > 1.40* 1.46* 1.44*  CALCIUM   < >  --  9.4   < > 9.3 8.9 9.1  PROT  --  7.7 7.2  --   --   --   --    ALBUMIN  --  2.9* 2.5*  --   --   --   --   AST  --  15 22  --   --   --   --   ALT  --  9 9  --   --   --   --   ALKPHOS  --  78 77  --   --   --   --   BILITOT  --  0.7 0.9  --   --   --   --   GFRNONAA   < >  --  43*   < > 40* 38* 39*  GFRAA   < >  --  50*   < > 47* 44* 45*  ANIONGAP   < >  --  13   < > 12 11 11    < > = values in this interval not displayed.    Hematology Recent Labs  Lab 12/07/19 0447 12/08/19 0443 12/09/19 0445  WBC 17.2* 14.2* 11.9*  RBC 4.19 3.87 3.83*  HGB 10.8* 9.7* 9.6*  HCT 34.2* 31.6* 31.6*  MCV 81.6 81.7 82.5  MCH 25.8* 25.1* 25.1*  MCHC 31.6 30.7 30.4  RDW 14.7 15.0 15.0  PLT 500* 370 340   BNPNo results for input(s): BNP, PROBNP in the last 168 hours.  DDimer  Recent Labs  Lab 12/05/19 0827 12/06/19 0345  DDIMER 1.75* 1.40*     Radiology  No results found.  Cardiac Studies   TTE 11/14/2018 1. The left ventricle has normal systolic function, with an ejection  fraction of 55-60%. The cavity size was normal. Left ventricular diastolic  Doppler parameters are consistent with pseudonormalization. No evidence of  left ventricular regional wall  motion abnormalities. GLS -23.1%.  2. The right ventricle has normal systolic function. The cavity was  normal. There is no increase in right ventricular wall thickness.  3. No evidence of mitral valve stenosis. Trivial mitral regurgitation.  4. The aortic valve is tricuspid. Mild calcification of the aortic valve.  No stenosis of the aortic valve.  5. The aortic root is normal in size and  structure.  6. The IVC is normal in size. No complete TR doppler jet so unable to  estimate PA systolic pressure.   Patient Profile  Kaitlin Cantrell is a 64 y.o. female with systolic HF with recovery of EF, DM, HTN who was admitted with Covid PNA. Course c/b afib and flutter with RVR.   Assessment & Plan   Atrial flutter/fibrillation with RVR: Continue xarelto. I have ordered metoprolol succinate  200 mg QD. We will arrange for her to see Dr. Shirlee Latch in 3-4 weeks to discuss DCCV. Continue xarelto uninterrupted.   Systolic HF with recovery of EF: Continue home meds. Euvolemic on exam.   For questions or updates, please contact [facility] Please consult [email or web address] for contact info under   Time Spent with Patient: I have spent a total of 25 minutes with patient reviewing hospital notes, telemetry, EKGs, labs and examining the patient as well as establishing an assessment and plan that was discussed with the patient.  > 50% of time was spent in direct patient care.    Signed, Lenna Gilford. Flora Lipps, MD [facility]  [facility]  12/10/2019 11:29 AM

## 2019-12-10 NOTE — Progress Notes (Signed)
Patient up in chair currently and in no acute distress however heart rate remains elevated in 140-150's. Dr. Marijo Conception paged to notify of above. Will continue to monitor.

## 2019-12-10 NOTE — Progress Notes (Addendum)
Patient becomes tachycardiac in 130-140's when staff in room. Scheduled Metoprolol administered. Will continue to monitor.

## 2019-12-11 LAB — GLUCOSE, CAPILLARY
Glucose-Capillary: 147 mg/dL — ABNORMAL HIGH (ref 70–99)
Glucose-Capillary: 178 mg/dL — ABNORMAL HIGH (ref 70–99)

## 2019-12-11 MED ORDER — METOPROLOL SUCCINATE ER 200 MG PO TB24: 200.0000 mg | ORAL_TABLET | Freq: Every day | ORAL | 0 refills | Status: DC

## 2019-12-11 MED ORDER — GUAIFENESIN-DM 100-10 MG/5ML PO SYRP
5.0000 mL | ORAL_SOLUTION | ORAL | 0 refills | Status: DC | PRN
Start: 2019-12-11 — End: 2022-05-24

## 2019-12-11 MED FILL — SM TUSSIN DM SYRUP: 100-10 | 7 days supply | Qty: 236 | Fill #0

## 2019-12-11 MED FILL — METOPROLOL SUCCINATE ER 200: 200 | 30 days supply | Qty: 30 | Fill #0

## 2019-12-11 NOTE — Discharge Summary (Addendum)
Name: Kaitlin Cantrell MRN: [id number] DOB: Nov 22, 1955 64 y.o. PCP: [name], NP  Date of Admission: 12/05/2019  5:57 AM Date of Discharge: 12/11/2019 Attending Physician: Erlinda Hong MD  Discharge Diagnosis: 1. New onset Atrial Flutter w/ RVR 2. COVID infection  Discharge Medications: Allergies as of 12/11/2019      Reactions   Albuterol Anaphylaxis   Throat/lips swelling   Morphine And Related Other (See Comments)   Very aggressive and doesn't help pain      Medication List    STOP taking these medications   betamethasone (augmented) 0.05 % lotion Commonly known as: DIPROLENE   carvedilol 12.5 MG tablet Commonly known as: COREG   diazepam 5 MG tablet Commonly known as: VALIUM   halobetasol 0.05 % cream Commonly known as: ULTRAVATE   ipratropium 17 MCG/ACT inhaler Commonly known as: ATROVENT HFA   Ipratropium-Albuterol 20-100 MCG/ACT Aers respimat Commonly known as: COMBIVENT   mupirocin ointment 2 % Commonly known as: BACTROBAN   ranitidine 150 MG tablet Commonly known as: ZANTAC   tiotropium 18 MCG inhalation capsule Commonly known as: Spiriva HandiHaler     TAKE these medications   atorvastatin 80 MG tablet Commonly known as: LIPITOR TAKE 1 TABLET BY MOUTH ONCE DAILY AT  6PM What changed:   how much to take  how to take this  when to take this  additional instructions   budesonide-formoterol 160-4.5 MCG/ACT inhaler Commonly known as: Symbicort Inhale 1 puff into the lungs 2 (two) times daily. What changed:   when to take this  reasons to take this   cyclobenzaprine 10 MG tablet Commonly known as: FLEXERIL Take 1 tablet (10 mg total) by mouth 3 (three) times daily as needed. for muscle spams What changed:   reasons to take this  additional instructions   Entresto 49-51 MG Generic drug: sacubitril-valsartan Take 1 tablet by mouth 2 (two) times daily. Needs appt for further refills   furosemide 40 MG tablet Commonly  known as: LASIX TAKE 1 TABLET BY MOUTH EVERY MONDAY, WEDNESDAY, FRIDAY, AND SATURDAY What changed:   how much to take  how to take this  when to take this  additional instructions   gabapentin 400 MG capsule Commonly known as: NEURONTIN Take 1 capsule (400 mg total) by mouth 3 (three) times daily.   glimepiride 4 MG tablet Commonly known as: AMARYL Take 1 tablet by mouth twice daily   guaiFENesin-dextromethorphan 100-10 MG/5ML syrup Commonly known as: ROBITUSSIN DM Take 5 mLs by mouth every 4 (four) hours as needed for cough.   HYDROcodone-acetaminophen 7.5-325 MG tablet Commonly known as: NORCO Take 1 tablet by mouth every 8 (eight) hours as needed for pain.   insulin glargine 100 UNIT/ML Solostar Pen Commonly known as: Lantus SoloStar Inject 15 Units into the skin daily at 10 pm.   [place] Lanius Caps Take 1 capsule by mouth daily.   metFORMIN 850 MG tablet Commonly known as: GLUCOPHAGE TAKE 1 TABLET BY MOUTH TWICE DAILY WITH A MEAL What changed:   how much to take  how to take this  when to take this  additional instructions   metoprolol 200 MG 24 hr tablet Commonly known as: TOPROL-XL Take 1 tablet (200 mg total) by mouth daily. Take with or immediately following a meal. Start taking on: December 12, 2019   Pen Needles 32G X 4 MM Misc 1 Units by Does not apply route daily.   traMADol 50 MG tablet Commonly known as: ULTRAM Take 2 tablets (  100 mg total) by mouth every 6 (six) hours as needed for moderate pain.   Xarelto 20 MG Tabs tablet Generic drug: rivaroxaban Take 1 tablet by mouth once daily What changed: how much to take       Disposition and follow-up:   Ms.Shantal R Cantrell was discharged from [facility] in Stable condition.  At the hospital follow up visit please address:  1. New onset Atrial Flutter w/ RVR - Assess heart rate and up-titrate rate control regimen if needed - Ensure she f/u with Cardiology for  cardioversion - Ensure she takes her Xarelto as prescribed  2. COVID infection - Found to have covid + status but did not have oxygen requirement - No need for quarantine as >2 weeks from contact - Please note she may have positive covid status on any pre-procedure testing but she is past infectious period.  3.  Labs / imaging needed at time of follow-up: cbc, bmp  4.  Pending labs/ test needing follow-up: N/A  Follow-up Appointments:  Follow-up Information    [name], NP. Call.   Specialty: Adult Health Nurse Practitioner Contact information: [address] [place] [address] [phone]        Laurey Morale, MD. Go on 12/19/2019.   Specialty: Cardiology Why: 9:30 AM Contact information: [address] [place] [address] [phone]               Hospital Course by problem list: Kaitlin Cantrell is a 64 yo F w/ PMH of DM, HTN, tobacco abuse, COPD, submassive PE on Xarelto and NICM admit for palpitations found to be in new onset atrial flutter  1. New onset Atrial Flutter w/ RVR: On admission HR of 150s with palpitations. Treated with IV lopressor and diltiazem drip with heart rate improving down to 100s on hospital day 2. Cardiology consulted with initiation of digoxin and oral metoprolol tartrate with further improvement of heart rate down to 80-90s. She was weaned off diltiazem drip and switched to metoprolol succinate with rate control between 80-100s. She was discharged with recommendation to follow up with outpatient cardiology for possible cardioversion.  2. COVID infection: Presents w/ complaint of malaise. Found to have sick contact 1 week prior to admission although have been recently vaccinated with [employer] vaccine. Noted to have only mildly elevated inflammatory markers and no supplemental oxygen requirement. Thought to have been the cause of arrhythmia. Monitored for symptoms without COVID specific treatment. Discharged  on room air.  Discharge Vitals:   BP 134/85 (BP Location: Left Arm)   Pulse 97   Temp 98.3 F (36.8 C) (Oral)   Resp 15   Ht 5' 5.75" (1.67 m)   Wt 115.1 kg   SpO2 98%   BMI 41.27 kg/m   Pertinent Labs, Studies, and Procedures:  CBC Latest Ref Rng & Units 12/09/2019 12/08/2019 12/07/2019  WBC 4.0 - 10.5 K/uL 11.9(H) 14.2(H) 17.2(H)  Hemoglobin 12.0 - 15.0 g/dL 6.9(C) 7.8(L) 10.8(L)  Hematocrit 36 - 46 % 31.6(L) 31.6(L) 34.2(L)  Platelets 150 - 400 K/uL 340 370 500(H)   BMP Latest Ref Rng & Units 12/09/2019 12/08/2019 12/07/2019  Glucose 70 - 99 mg/dL 381(O) 175(Z) 025(E)  BUN 8 - 23 mg/dL 52(D) 78(E) 42(P)  Creatinine 0.44 - 1.00 mg/dL 5.36(R) 4.43(X) 5.40(G)  BUN/Creat Ratio 12 - 28 - - -  Sodium 135 - 145 mmol/L 133(L) 130(L) 127(L)  Potassium 3.5 - 5.1 mmol/L 5.3(H) 4.9 4.8  Chloride 98 -  111 mmol/L 97(L) 95(L) 92(L)  CO2 22 - 32 mmol/L 25 24 23   Calcium 8.9 - 10.3 mg/dL 9.1 8.9 9.3   PORTABLE CHEST 1 VIEW  FINDINGS: Lungs are clear. Heart size and pulmonary vascularity are normal. No adenopathy. No bone lesions.  IMPRESSION: Lungs clear.  Cardiac silhouette within normal limits.  Discharge Instructions: Discharge Instructions    Call MD for:  difficulty breathing, headache or visual disturbances   Complete by: As directed    Call MD for:  persistant dizziness or light-headedness   Complete by: As directed    Call MD for:  redness, tenderness, or signs of infection (pain, swelling, redness, odor or green/yellow discharge around incision site)   Complete by: As directed    Call MD for:  temperature >100.4   Complete by: As directed    Diet - low sodium heart healthy   Complete by: As directed    Discharge instructions   Complete by: As directed    Dear  You came to [name] with shortness of breath. We have determined this was caused by COVID and atrial flutter. Here are our recommendations for you at discharge:  Please stop taking your  carvedilol Please start metoprolol succinate 200mg  daily Please make sure to take your Xarelto everyday  Thank you for choosing [facility].   Increase activity slowly   Complete by: As directed      Signed: [place], MD 12/11/2019, 4:25 PM Pager: [phone] After 5pm on weekdays and 1pm on weekends: On Call Pager: [phone]

## 2019-12-11 NOTE — Progress Notes (Signed)
   Subjective:  Kaitlin Cantrell is a 64 y.o. with PMH of DM, HTN, tobacco abuse, COPD, submassive PE, non-ischemic cardiomyopathy, systolic heart failure (EF 20-25->55-60 w/ treatment) admit for palpitations on hospital day 3  Kaitlin Cantrell was examined and evaluated at bedside this am. She mentions endorsing coughs overnight but denies significant purulence. Mentions some fatigue and weakness and intermittent dyspnea but did not require oxygen. Discussed plan to f/u outpatient with cardiology for cardioversion. Kaitlin Cantrell expressed understanding.  Objective:  Vital signs in last 24 hours: Vitals:   12/10/19 1158 12/10/19 1610 12/10/19 2109 12/11/19 0542  BP: 122/87 (!) 114/54 (!) 146/72 134/85  Pulse: 98 84 94 97  Resp: 19 16 20 15   Temp: 98.8 F (37.1 C) 97.9 F (36.6 C) 98.5 F (36.9 C) 98.3 F (36.8 C)  TempSrc: Oral Oral Oral Oral  SpO2: 95% 94% 96% 98%  Weight:      Height:       Gen: Well-developed, well nourished, NAD Pulm: Breathing comfortably on room air, no cough, no distress  Extm: ROM intact, + Varicose veins  Assessment/Plan:  Principal Problem:   Atrial flutter with rapid ventricular response (HCC) Active Problems:   HTN (hypertension)   Type 2 diabetes mellitus without complication, without long-term current use of insulin (HCC)   Chronic obstructive pulmonary disease (HCC)   COVID-19  Kaitlin Cantrell is a 64 y.o. with PMH of DM, HTN, tobacco abuse, COPD, submassive PE, non-ischemic cardiomyopathy, systolic heart failure (EF 20-25->55-60 w/ treatment) admit for new onset atrial flutter with RVR and COVID infection.  Atrial Flutter with RVR Present w/ new onset arrhythmia. No prior history. Does have significant structural heart disease hx. Suspected inciting event due to COVID pneumonia. CHADSVASC2 score of 4 on Xarelto at home for PE. Have been off dilt gtt for 48 hours. HR sustaining between 80-100s on metoprolol succinate 200mg . Previously on  carvedilol. Plan for discharge w/ outpatient f/u for possible cardioversion - Appreciate cardiology recs - Discharge home w/ metoprolol succinate and outpatient cardiology follow up  COVID infection COPD Inflammatory markers trending down. Continues to be asymptomatic from COVID, likely due to vaccination status with 77 and Kaitlin Cantrell. C/w observe - C/w dulera BID - C/w monitor for hypoxia - C/w airborne/contact precautions (can be off precautions at discharge (symptoms started on 11/26/19)  Diabetes Mellitus On lantus 15 units, glimepiride at home. Better control w/ increased doses with novolog 8 units TID qc and 20 units qhs. CBG 94 overnight - C/w tradjenta per COVID treatment protocol - C/w novolog to 8 units TID qc - C/w lantus to 20 units qhs  Non-ischemic cardiomyopathy Chronic systolic heart failure Has hx of non-ischemic cardiomyopathy noted on prior admission on 2017 w/ submassive PE w/ subsequent follow up R/L cath showing no significant CAD. W/ treatment had improvement in ED w/ most recent Echo on 10/2018 w/ EF 55-60%. - C/w monitor - D/c carvedilol at discharge.  DVT prophx: Xarelto Diet: CM/HH Bowel: Miralax Code: Full  Prior to Admission Living Arrangement: Home Anticipated Discharge Location: Home Barriers to Discharge: Medical treatment Dispo: Anticipated discharge in approximately 1-2 day(s).   2018, MD 12/11/2019, 11:02 AM Pager: [phone] After 5pm on weekdays and 1pm on weekends: On Call Pager: [phone]

## 2019-12-11 NOTE — Progress Notes (Signed)
Tele check performed. HR on average in the 90-100 bpm range. Had some in the 130s overnight. Would recommend to continue metoprolol succinate 200 mg QD for now. Continue xarelto. We will arrange follow-up in 3-4 weeks with Dr. Shirlee Latch to discuss cardioversion.   Cardiology will sign off.   Gerri Spore T. Flora Lipps, MD [facility]  [address], [address] [place], [place] [address] [phone]  10:24 AM

## 2019-12-11 NOTE — Discharge Instructions (Signed)
Atrial Flutter  Atrial flutter is a type of abnormal heart rhythm (arrhythmia). The heart has an electrical system that tells it how to beat. In atrial flutter, the signals move rapidly in the top chambers of the heart (the atria). This makes your heart beat very fast. Atrial flutter can come and go, or it can be permanent. The goal of treatment is to prevent blood clots from forming, control your heart rate, or restore your heartbeat to a normal rhythm. If this condition is not treated, it can cause serious problems, such as a weakened heart muscle (cardiomyopathy) or a stroke. What are the causes? This condition is often caused by conditions that damage the heart's electrical system. These include:  Heart conditions and heart surgery. These include heart attacks and open-heart surgery.  Lung problems, such as COPD or a blood clot in the lung (pulmonary embolism, or PE).  Poorly controlled high blood pressure (hypertension).  Overactive thyroid (hyperthyroidism).  Diabetes. In some cases, the cause of this condition is not known. What increases the risk? You are more likely to develop this condition if:  You are an elderly adult.  You are a man.  You are overweight (obese).  You have obstructive sleep apnea.  You have a family history of atrial flutter.  You have diabetes.  You drink a lot of alcohol, especially binge drinking.  You use drugs, including cannabis.  You smoke. What are the signs or symptoms? Symptoms of this condition include:  A feeling that your heart is pounding or racing (palpitations).  Shortness of breath.  Chest pain.  Feeling dizzy or light-headed.  Fainting.  Low blood pressure (hypotension).  Fatigue.  Tiring easily during exercise or activity. In some cases, there are no symptoms. How is this diagnosed? This condition may be diagnosed with:  An electrocardiogram (ECG) to check electrical signals of the heart.  An ambulatory  cardiac monitor to record your heart's activity for a few days.  An echocardiogram to create pictures of your heart.  A transesophageal echocardiogram (TEE) to create even better pictures of your heart.  A stress test to check your blood supply while you exercise.  Imaging tests, such as a CT scan or chest X-ray.  Blood tests. How is this treated? Treatment depends on underlying conditions and how you feel when you experience atrial flutter. This condition may be treated with:  Medicines to prevent blood clots or to treat heart rate or heart rhythm problems.  Electrical cardioversion to reset the heart's rhythm.  Ablation to remove the heart tissue that sends abnormal signals.  Left atrial appendage closure to seal the area where blood clots can form. In some cases, underlying conditions will be treated. Follow these instructions at home: Medicines  Take over-the-counter and prescription medicines only as told by your health care provider.  Do not take any new medicines without talking to your health care provider.  If you are taking blood thinners: ? Talk with your health care provider before you take any medicines that contain aspirin or NSAIDs, such as ibuprofen. These medicines increase your risk for dangerous bleeding. ? Take your medicine exactly as told, at the same time every day. ? Avoid activities that could cause injury or bruising, and follow instructions about how to prevent falls. ? Wear a medical alert bracelet or carry a card that lists what medicines you take. Lifestyle  Eat heart-healthy foods. Talk with a dietitian to make an eating plan that is right for you.  Do   not use any products that contain nicotine or tobacco, such as cigarettes, e-cigarettes, and chewing tobacco. If you need help quitting, ask your health care provider.  Do not drink alcohol.  Do not use drugs, including cannabis.  Lose weight if you are overweight or obese.  Exercise  regularly as instructed by your health care provider. General instructions  Do not use diet pills unless your health care provider approves. Diet pills may make heart problems worse.  If you have obstructive sleep apnea, manage your condition as told by your health care provider.  Keep all follow-up visits as told by your health care provider. This is important. Contact a health care provider if you:  Notice a change in the rate, rhythm, or strength of your heartbeat.  Are taking a blood thinner and you notice more bruising.  Have a sudden change in weight.  Tire more easily when you exercise or do heavy work. Get help right away if you have:  Pain or pressure in your chest.  Shortness of breath.  Fainting.  Increasing sweating with no known cause.  Side effects of blood thinners, such as blood in your vomit, stool, or urine, or bleeding that cannot stop.  Any symptoms of a stroke. "BE FAST" is an easy way to remember the main warning signs of a stroke: ? B - Balance. Signs are dizziness, sudden trouble walking, or loss of balance. ? E - Eyes. Signs are trouble seeing or a sudden change in vision. ? F - Face. Signs are sudden weakness or numbness of the face, or the face or eyelid drooping on one side. ? A - Arms. Signs are weakness or numbness in an arm. This happens suddenly and usually on one side of the body. ? S - Speech. Signs are sudden trouble speaking, slurred speech, or trouble understanding what people say. ? T - Time. Time to call emergency services. Write down what time symptoms started.  Other signs of a stroke, such as: ? A sudden, severe headache with no known cause. ? Nausea or vomiting. ? Seizure.  These symptoms may represent a serious problem that is an emergency. Do not wait to see if the symptoms will go away. Get medical help right away. Call your local emergency services (911 in the [place] Do not drive yourself to the hospital. Summary  Atrial  flutter is an abnormal heart rhythm that can give you symptoms of palpitations, shortness of breath, or fatigue.  Atrial flutter is often treated with medicines to keep your heart in a normal rhythm and to prevent a stroke.  Get help right away if you cannot catch your breath, or have chest pain or pressure.  Get help right away if you have signs or symptoms of a stroke. This information is not intended to replace advice given to you by your health care provider. Make sure you discuss any questions you have with your health care provider. Document Revised: 10/10/2018 Document Reviewed: 10/10/2018 Elsevier Patient Education  2020 [employer]  

## 2019-12-19 ENCOUNTER — Telehealth (HOSPITAL_COMMUNITY): Payer: Self-pay

## 2019-12-19 ENCOUNTER — Ambulatory Visit (HOSPITAL_COMMUNITY)
Admit: 2019-12-19 | Discharge: 2019-12-19 | Disposition: A | Discharge status: Home / Self Care | Payer: Medicare Other | Source: Ambulatory Visit | Attending: Adult Health | Admitting: Adult Health

## 2019-12-19 ENCOUNTER — Encounter (HOSPITAL_COMMUNITY): Payer: Self-pay

## 2019-12-19 ENCOUNTER — Other Ambulatory Visit: Payer: Self-pay

## 2019-12-19 VITALS — BP 138/86 | HR 103 | Wt 246.4 lb

## 2019-12-19 DIAGNOSIS — F17210 Nicotine dependence, cigarettes, uncomplicated: Secondary | ICD-10-CM | POA: Diagnosis not present

## 2019-12-19 DIAGNOSIS — I5032 Chronic diastolic (congestive) heart failure: Secondary | ICD-10-CM | POA: Diagnosis not present

## 2019-12-19 DIAGNOSIS — U071 COVID-19: Secondary | ICD-10-CM | POA: Insufficient documentation

## 2019-12-19 DIAGNOSIS — I5042 Chronic combined systolic (congestive) and diastolic (congestive) heart failure: Secondary | ICD-10-CM | POA: Diagnosis not present

## 2019-12-19 DIAGNOSIS — J449 Chronic obstructive pulmonary disease, unspecified: Secondary | ICD-10-CM | POA: Insufficient documentation

## 2019-12-19 DIAGNOSIS — Z79899 Other long term (current) drug therapy: Secondary | ICD-10-CM | POA: Diagnosis not present

## 2019-12-19 DIAGNOSIS — Z7901 Long term (current) use of anticoagulants: Secondary | ICD-10-CM | POA: Insufficient documentation

## 2019-12-19 DIAGNOSIS — R0600 Dyspnea, unspecified: Secondary | ICD-10-CM | POA: Diagnosis not present

## 2019-12-19 DIAGNOSIS — R0602 Shortness of breath: Secondary | ICD-10-CM | POA: Insufficient documentation

## 2019-12-19 DIAGNOSIS — I420 Dilated cardiomyopathy: Secondary | ICD-10-CM

## 2019-12-19 DIAGNOSIS — E875 Hyperkalemia: Secondary | ICD-10-CM | POA: Diagnosis not present

## 2019-12-19 DIAGNOSIS — I4891 Unspecified atrial fibrillation: Secondary | ICD-10-CM | POA: Insufficient documentation

## 2019-12-19 DIAGNOSIS — Z86711 Personal history of pulmonary embolism: Secondary | ICD-10-CM | POA: Insufficient documentation

## 2019-12-19 DIAGNOSIS — I428 Other cardiomyopathies: Secondary | ICD-10-CM | POA: Insufficient documentation

## 2019-12-19 DIAGNOSIS — G894 Chronic pain syndrome: Secondary | ICD-10-CM | POA: Insufficient documentation

## 2019-12-19 DIAGNOSIS — I130 Hypertensive heart and chronic kidney disease with heart failure and stage 1 through stage 4 chronic kidney disease, or unspecified chronic kidney disease: Secondary | ICD-10-CM | POA: Diagnosis not present

## 2019-12-19 DIAGNOSIS — N183 Chronic kidney disease, stage 3 unspecified: Secondary | ICD-10-CM | POA: Diagnosis not present

## 2019-12-19 DIAGNOSIS — Z794 Long term (current) use of insulin: Secondary | ICD-10-CM | POA: Insufficient documentation

## 2019-12-19 DIAGNOSIS — I4892 Unspecified atrial flutter: Secondary | ICD-10-CM | POA: Insufficient documentation

## 2019-12-19 DIAGNOSIS — Z8249 Family history of ischemic heart disease and other diseases of the circulatory system: Secondary | ICD-10-CM | POA: Insufficient documentation

## 2019-12-19 DIAGNOSIS — E1122 Type 2 diabetes mellitus with diabetic chronic kidney disease: Secondary | ICD-10-CM | POA: Diagnosis not present

## 2019-12-19 LAB — BASIC METABOLIC PANEL
Anion gap: 9 (ref 5–15)
BUN: 23 mg/dL (ref 8–23)
CO2: 25 mmol/L (ref 22–32)
Calcium: 9.3 mg/dL (ref 8.9–10.3)
Chloride: 102 mmol/L (ref 98–111)
Creatinine, Ser: 1.58 mg/dL — ABNORMAL HIGH (ref 0.44–1.00)
GFR calc Af Amer: 40 mL/min — ABNORMAL LOW (ref >60)
GFR calc non Af Amer: 35 mL/min — ABNORMAL LOW (ref >60)
Glucose, Bld: 168 mg/dL — ABNORMAL HIGH (ref 70–99)
Potassium: 5.3 mmol/L — ABNORMAL HIGH (ref 3.5–5.1)
Sodium: 136 mmol/L (ref 135–145)

## 2019-12-19 LAB — CBC
HCT: 34.3 % — ABNORMAL LOW (ref 36.0–46.0)
Hemoglobin: 10 g/dL — ABNORMAL LOW (ref 12.0–15.0)
MCH: 25.3 pg — ABNORMAL LOW (ref 26.0–34.0)
MCHC: 29.2 g/dL — ABNORMAL LOW (ref 30.0–36.0)
MCV: 86.6 fL (ref 80.0–100.0)
Platelets: 327 10*3/uL (ref 150–400)
RBC: 3.96 MIL/uL (ref 3.87–5.11)
RDW: 15.3 % (ref 11.5–15.5)
WBC: 8.8 10*3/uL (ref 4.0–10.5)
nRBC: 0 % (ref 0.0–0.2)

## 2019-12-19 NOTE — H&P (View-Only) (Signed)
Date:  12/19/2019   ID:  [name], DOB [id number], MRN [id number]   Provider location: [facility] Type of Visit: Established patient   PCP:  [name], NP  Cardiologist:  Dr. Shirlee Latch  Chief Complaint: [facility] F/u for Chronic Heart Failure and Persistent Atrial Flutter    History of Present Illness: Kaitlin Cantrell is a 64 y.o. female who has a history of COPD, tobacco abuse, DM, HTN, arthritis, and chronic pain syndrome and presented to [facility] with SOB in 8/17.  She had had several months of increasing exertional dyspnea prior to this.  CT angio 12/29/15 showed submassive PE with concerns for right heart strain. Now on Xarelto.  Echo 12/30/15 showed LVEF 20-25%, Grade 3 DD, Mod MR, mod LAE, Mild RV dilation with mildly reduced systolic function, Moderate RAE, Moderate TR. Peak PA pressure 54 mm Hg.  Cardiac MRI (9/17) showed EF 15%, moderate LV dilation, mild RV dilation/moderately decreased RV systolic function, no LGE.  She was diuresed in the hospital and begun on Xarelto.  Smokes 1 ppd for many years.  She tried Chantix but did not stop. No ETOH or drug use. No history of cancer or recent surgery, no long car/plane trips prior to her PE.   In 9/17, spironolactone was discontinued due to persistent hyperkalemia.  RHC/LHC in 12/17 showed no significant CAD, optimized filling pressures, low but not markedly low cardiac output.   Echo in 3/18 showed  EF improved to 55% with mild LVH.  Repeat echo 7/20, EF 55-60%.    Not seen in [facility] since 7/20. Now presents for post hospital f/u. Was admitted 2 weeks ago, 8/21 for COVID PNA. Hospital course c/b atrial flutter w/ RVR. She did not require intubation. She was seen by general cardiology w/ recommendations to treat w/ rate control w/ metoprolol + continuation of Xarelto. Recommend eventual DCCV once infection cleared.  Today in f/u, she remains in atrial flutter 104 bpm. BP stable. Report  compliance w/ Metoprolol. She is on a high dose, 200 mg daily. She thinks that she may have missed a dose of Xarelto in the last 30 days. She continues to struggle w/ DOE since being diagnosed w/ COVID. SOB walking short distances. No resting dyspnea. Denies orthopnea/PND. No LEE. No fluid overloaded on exam. Wt is stable c/w dry wt. She denies fever. No cough. She has cut back on smoking, down from 1 ppd>>1-2 cigs/day. Also w/ known COPD. Not on amiodarone.     PMH: 1. PE: 12/2015 CT angio 12/29/15 submassive PE with concerns for right heart strain. Now on Xarelto.  2. Cardiomyopathy:  Found 8/17.  - CMRI 01/01/2016: Nonischemic cardiomyopathy.  EF 15%, moderate LV dilation, mild RV dilation/moderately decreased RV systolic function.  No LGE: no infiltrative or inflammatory process noted, no evidence for prior MI. - ECHO  12/30/2015: EF 20-25%. Grade III DD, Mod TR, PASP 54, mildly decreased RV systolic function.  - Hyperkalemia with spironolactone.  - LHC/RHC (12/17): No CAD, mean RA 2, PA 39/13 mean 23, mean PCWP 7, CI 2.1.  - Echo (3/18): EF 55%, mild LVH.  - Echo (7/20): EF 55-60%, normal RV.  3. ABIs 01/05/2016 normal   4. COPD: Active smoker.  5. Type II diabetes 6. HTN 7. Chronic pain syndrome 8. CKD stage 3: Suspect diabetic nephropathy.  9. Atrial Flutter   Labs (9/17): SPEP negative, HIV negative, K 4.4, creatinine 1.22 Labs (10/17): LDL 111, HDL 32, TSH normal, K  5.2, creatinine 1.49, digoxin 0.7, BNP 245 Labs (12/17): K 4.4, creatinine 1.46 => 1.5, digoxin 0.9 Labs (1/18): K 4.6, creatinine 1.37, digoxin 1.3, LDL 83 Labs (9/18): K 4.9, creatinine 1.28  Labs (11/19): K 5.2, creatinine 1.5 Labs (4/20): LDL 91, K 5, creatinine 1.59 Labs (8/21): K 5.3, SCr 1.44, hgb 10.8, TSH 4.5   Current Outpatient Medications  Medication Sig Dispense Refill  . atorvastatin (LIPITOR) 80 MG tablet TAKE 1 TABLET BY MOUTH ONCE DAILY AT  6PM (Patient taking differently: Take 80 mg by mouth daily at 6  PM. ) 90 tablet 0  . budesonide-formoterol (SYMBICORT) 160-4.5 MCG/ACT inhaler Inhale 1 puff into the lungs 2 (two) times daily. (Patient taking differently: Inhale 1 puff into the lungs 2 (two) times daily as needed (wheezing, shortness of breath). ) 3 Inhaler 4  . cyclobenzaprine (FLEXERIL) 10 MG tablet Take 1 tablet (10 mg total) by mouth 3 (three) times daily as needed. for muscle spams (Patient taking differently: Take 10 mg by mouth 3 (three) times daily as needed for muscle spasms. ) 270 tablet 1  . furosemide (LASIX) 40 MG tablet TAKE 1 TABLET BY MOUTH EVERY MONDAY, WEDNESDAY, FRIDAY, AND SATURDAY (Patient taking differently: Take 40 mg by mouth 4 (four) times a week. Every Monday, Wednesday, Friday, and Saturday) 50 tablet 0  . gabapentin (NEURONTIN) 400 MG capsule Take 1 capsule (400 mg total) by mouth 3 (three) times daily. 270 capsule 1  . glimepiride (AMARYL) 4 MG tablet Take 1 tablet by mouth twice daily 180 tablet 0  . guaiFENesin-dextromethorphan (ROBITUSSIN DM) 100-10 MG/5ML syrup Take 5 mLs by mouth every 4 (four) hours as needed for cough. 118 mL 0  . HYDROcodone-acetaminophen (NORCO) 7.5-325 MG tablet Take 1 tablet by mouth every 8 (eight) hours as needed for pain.    . Insulin Glargine (LANTUS SOLOSTAR) 100 UNIT/ML Solostar Pen Inject 15 Units into the skin daily at 10 pm. 5 pen 1  . Insulin Pen Needle (PEN NEEDLES) 32G X 4 MM MISC 1 Units by Does not apply route daily. 30 each 1  . Krill Oil CAPS Take 1 capsule by mouth daily.     . metFORMIN (GLUCOPHAGE) 850 MG tablet TAKE 1 TABLET BY MOUTH TWICE DAILY WITH A MEAL (Patient taking differently: Take 850 mg by mouth 2 (two) times daily with a meal. ) 90 tablet 1  . metoprolol succinate (TOPROL-XL) 200 MG 24 hr tablet Take 1 tablet (200 mg total) by mouth daily. Take with or immediately following a meal. 30 tablet 0  . sacubitril-valsartan (ENTRESTO) 49-51 MG Take 1 tablet by mouth 2 (two) times daily. Needs appt for further refills  180 tablet 0  . traMADol (ULTRAM) 50 MG tablet Take 2 tablets (100 mg total) by mouth every 6 (six) hours as needed for moderate pain. 240 tablet 1  . XARELTO 20 MG TABS tablet Take 1 tablet by mouth once daily (Patient taking differently: Take 20 mg by mouth daily. ) 90 tablet 3   No current facility-administered medications for this encounter.    Allergies:   Albuterol and Morphine and related   Social History:  The patient  reports that she has been smoking cigarettes. She has a 45.00 pack-year smoking history. She has never used smokeless tobacco. She reports current alcohol use. She reports that she does not use drugs.   Family History:  The patient's family history includes COPD in her paternal grandfather; Cancer in her maternal grandmother; Colon polyps in her  father; Diabetes in her brother, father, mother, and paternal grandmother; Heart disease in her father, paternal grandfather, and paternal grandmother; Heart murmur in her mother; Hypertension in her brother, brother, and mother.   ROS:  Please see the history of present illness.   All other systems are personally reviewed and negative.   Vitals:   BP 138/86   Pulse (!) 103   Wt 111.8 kg   SpO2 97%   BMI 40.07 kg/m  PHYSICAL EXAM: General:  Well appearing. No respiratory difficulty HEENT: normal Neck: supple. no JVD. Carotids 2+ bilat; no bruits. No lymphadenopathy or thyromegaly appreciated. Cor: PMI nondisplaced. Irregular rhythm, mildly tachy rate. No rubs, gallops or murmurs. Lungs: clear Abdomen: soft, nontender, nondistended. No hepatosplenomegaly. No bruits or masses. Good bowel sounds. Extremities: no cyanosis, clubbing, rash, edema Neuro: alert & oriented x 3, cranial nerves grossly intact. moves all 4 extremities w/o difficulty. Affect pleasant.   Recent Labs: 12/05/2019: TSH 4.486 12/06/2019: ALT 9 12/09/2019: BUN 35; Creatinine, Ser 1.44; Hemoglobin 9.6; Platelets 340; Potassium 5.3; Sodium 133  Personally  reviewed   Wt Readings from Last 3 Encounters:  12/19/19 111.8 kg  12/05/19 115.1 kg  11/14/18 115.1 kg      ASSESSMENT AND PLAN:  1. H/O PE: Submassive on CT. Etiology uncertain: no known cancer, long trip, recent surgery.  Possibly related to stasis in the setting of cardiomyopathy with severely decreased systolic function.  - Given cardiomyopathy and submassive PE with no certain trigger, she has been continued on long-term anticoagulation. Remains on Xarelto.  2. Chronic systolic => diastolic CHF: Nonischemic cardiomyopathy.  ECHO 12/2015 EF 15-20% with diffuse hypokinesis. Uncertain etiology. No chest pain. Symptoms began as orthopnea around 6/17, then developed progressive exertional dyspnea. ECG shows anteroseptal MI but no change from 2013 ECG. Cardiac MRI did not show LGE. HIV negative, thyroid indices ok (mild TSH elevation but normal T3/T4), SPEP negative.  12/17 RHC/LHC showed no angiographic CAD, optimized filling pressures, low but not markedly low cardiac output.  Echo 3/18 showed recovery of LV function with EF up to 55%. Echo repeated 7/20 and EF still 55-60%. Now w/ recent COVID infection, tested + 2 weeks ago on 8/5 c/b new atrial flutter w/ RVR (persistent). Luckily, she is w/o evidence of gross volume overload and her wt is c/w previous dry wt, but remains symptomatic w/ exertional dyspnea (likely multifactorial from rapid atrial flutter, baseline COPD + residual effects of COVID).   - Plan TEE/DCCV early next week w/ Dr. Shirlee Latch  - Continue Toprol XL 200 mg daily  - Continue Entresto 49/51 bid.  Will avoid titration today given risk of hypotension w/ rapid atrial flutter.  - Off spironolactone with persistent hyperkalemia.   - Continue Lasix 40 mg every other day.  - Check BMP today  - will hold off on repeat echo, since she is out of rhythm  2. Persistent Atrial Flutter: in the setting of recent COVID infection and known pulmonary disease. V-rate 100 bpm on EKG today.  BP normotensive. TSH WNL. On Xarelto for h/o PE. Thinks she may have missed a dose in the last 30 days.  - Plan TEE/DCCV early next week w/ Dr. Shirlee Latch  - Check BMP today  3. CKD: Stage III.  check BMP .  4. Active smoker: She failed Chantix.  She has cut down considerably since being discharged from hospital w/ COVID, down from 1ppd>>1-2 cigs/day 5. COVID 19 Infection  + test 12/05/19, not vaccinated. Hospitalized but did not require  intubation. Continues w/ SOB but no fever   F/u in 1-2 weeks post TEE/DCCV w/ APP for repeat EKG.   Signed, Kaitlin Stradford, PA-C  12/19/2019  [facility] [address] [facility] [place] [address] [phone] (office) [phone] (fax) 

## 2019-12-19 NOTE — Telephone Encounter (Signed)
Tee/dccv instruction letter given during visit.

## 2019-12-19 NOTE — Progress Notes (Addendum)
Date:  12/19/2019   ID:  [name], DOB 02-16-1956, MRN [id number]   Provider location: [facility] Type of Visit: Established patient   PCP:  [name], NP  Cardiologist:  Dr. Shirlee Latch  Chief Complaint: [facility] F/u for Chronic Heart Failure and Persistent Atrial Flutter    History of Present Illness: Kaitlin Cantrell is a 64 y.o. female who has a history of COPD, tobacco abuse, DM, HTN, arthritis, and chronic pain syndrome and presented to [facility] with SOB in 8/17.  She had had several months of increasing exertional dyspnea prior to this.  CT angio 12/29/15 showed submassive PE with concerns for right heart strain. Now on Xarelto.  Echo 12/30/15 showed LVEF 20-25%, Grade 3 DD, Mod MR, mod LAE, Mild RV dilation with mildly reduced systolic function, Moderate RAE, Moderate TR. Peak PA pressure 54 mm Hg.  Cardiac MRI (9/17) showed EF 15%, moderate LV dilation, mild RV dilation/moderately decreased RV systolic function, no LGE.  She was diuresed in the hospital and begun on Xarelto.  Smokes 1 ppd for many years.  She tried Chantix but did not stop. No ETOH or drug use. No history of cancer or recent surgery, no long car/plane trips prior to her PE.   In 9/17, spironolactone was discontinued due to persistent hyperkalemia.  RHC/LHC in 12/17 showed no significant CAD, optimized filling pressures, low but not markedly low cardiac output.   Echo in 3/18 showed  EF improved to 55% with mild LVH.  Repeat echo 7/20, EF 55-60%.    Not seen in [facility] since 7/20. Now presents for post hospital f/u. Was admitted 2 weeks ago, 8/21 for COVID PNA. Hospital course c/b atrial flutter w/ RVR. She did not require intubation. She was seen by general cardiology w/ recommendations to treat w/ rate control w/ metoprolol + continuation of Xarelto. Recommend eventual DCCV once infection cleared.  Today in f/u, she remains in atrial flutter 104 bpm. BP stable. Report  compliance w/ Metoprolol. She is on a high dose, 200 mg daily. She thinks that she may have missed a dose of Xarelto in the last 30 days. She continues to struggle w/ DOE since being diagnosed w/ COVID. SOB walking short distances. No resting dyspnea. Denies orthopnea/PND. No LEE. No fluid overloaded on exam. Wt is stable c/w dry wt. She denies fever. No cough. She has cut back on smoking, down from 1 ppd>>1-2 cigs/day. Also w/ known COPD. Not on amiodarone.     PMH: 1. PE: 12/2015 CT angio 12/29/15 submassive PE with concerns for right heart strain. Now on Xarelto.  2. Cardiomyopathy:  Found 8/17.  - CMRI 01/01/2016: Nonischemic cardiomyopathy.  EF 15%, moderate LV dilation, mild RV dilation/moderately decreased RV systolic function.  No LGE: no infiltrative or inflammatory process noted, no evidence for prior MI. - ECHO  12/30/2015: EF 20-25%. Grade III DD, Mod TR, PASP 54, mildly decreased RV systolic function.  - Hyperkalemia with spironolactone.  - LHC/RHC (12/17): No CAD, mean RA 2, PA 39/13 mean 23, mean PCWP 7, CI 2.1.  - Echo (3/18): EF 55%, mild LVH.  - Echo (7/20): EF 55-60%, normal RV.  3. ABIs 01/05/2016 normal   4. COPD: Active smoker.  5. Type II diabetes 6. HTN 7. Chronic pain syndrome 8. CKD stage 3: Suspect diabetic nephropathy.  9. Atrial Flutter   Labs (9/17): SPEP negative, HIV negative, K 4.4, creatinine 1.22 Labs (10/17): LDL 111, HDL 32, TSH normal, K  5.2, creatinine 1.49, digoxin 0.7, BNP 245 Labs (12/17): K 4.4, creatinine 1.46 => 1.5, digoxin 0.9 Labs (1/18): K 4.6, creatinine 1.37, digoxin 1.3, LDL 83 Labs (9/18): K 4.9, creatinine 1.28  Labs (11/19): K 5.2, creatinine 1.5 Labs (4/20): LDL 91, K 5, creatinine 1.59 Labs (8/21): K 5.3, SCr 1.44, hgb 10.8, TSH 4.5   Current Outpatient Medications  Medication Sig Dispense Refill  . atorvastatin (LIPITOR) 80 MG tablet TAKE 1 TABLET BY MOUTH ONCE DAILY AT  6PM (Patient taking differently: Take 80 mg by mouth daily at 6  PM. ) 90 tablet 0  . budesonide-formoterol (SYMBICORT) 160-4.5 MCG/ACT inhaler Inhale 1 puff into the lungs 2 (two) times daily. (Patient taking differently: Inhale 1 puff into the lungs 2 (two) times daily as needed (wheezing, shortness of breath). ) 3 Inhaler 4  . cyclobenzaprine (FLEXERIL) 10 MG tablet Take 1 tablet (10 mg total) by mouth 3 (three) times daily as needed. for muscle spams (Patient taking differently: Take 10 mg by mouth 3 (three) times daily as needed for muscle spasms. ) 270 tablet 1  . furosemide (LASIX) 40 MG tablet TAKE 1 TABLET BY MOUTH EVERY MONDAY, WEDNESDAY, FRIDAY, AND SATURDAY (Patient taking differently: Take 40 mg by mouth 4 (four) times a week. Every Monday, Wednesday, Friday, and Saturday) 50 tablet 0  . gabapentin (NEURONTIN) 400 MG capsule Take 1 capsule (400 mg total) by mouth 3 (three) times daily. 270 capsule 1  . glimepiride (AMARYL) 4 MG tablet Take 1 tablet by mouth twice daily 180 tablet 0  . guaiFENesin-dextromethorphan (ROBITUSSIN DM) 100-10 MG/5ML syrup Take 5 mLs by mouth every 4 (four) hours as needed for cough. 118 mL 0  . HYDROcodone-acetaminophen (NORCO) 7.5-325 MG tablet Take 1 tablet by mouth every 8 (eight) hours as needed for pain.    . Insulin Glargine (LANTUS SOLOSTAR) 100 UNIT/ML Solostar Pen Inject 15 Units into the skin daily at 10 pm. 5 pen 1  . Insulin Pen Needle (PEN NEEDLES) 32G X 4 MM MISC 1 Units by Does not apply route daily. 30 each 1  . Krill Oil CAPS Take 1 capsule by mouth daily.     . metFORMIN (GLUCOPHAGE) 850 MG tablet TAKE 1 TABLET BY MOUTH TWICE DAILY WITH A MEAL (Patient taking differently: Take 850 mg by mouth 2 (two) times daily with a meal. ) 90 tablet 1  . metoprolol succinate (TOPROL-XL) 200 MG 24 hr tablet Take 1 tablet (200 mg total) by mouth daily. Take with or immediately following a meal. 30 tablet 0  . sacubitril-valsartan (ENTRESTO) 49-51 MG Take 1 tablet by mouth 2 (two) times daily. Needs appt for further refills  180 tablet 0  . traMADol (ULTRAM) 50 MG tablet Take 2 tablets (100 mg total) by mouth every 6 (six) hours as needed for moderate pain. 240 tablet 1  . XARELTO 20 MG TABS tablet Take 1 tablet by mouth once daily (Patient taking differently: Take 20 mg by mouth daily. ) 90 tablet 3   No current facility-administered medications for this encounter.    Allergies:   Albuterol and Morphine and related   Social History:  The patient  reports that she has been smoking cigarettes. She has a 45.00 pack-year smoking history. She has never used smokeless tobacco. She reports current alcohol use. She reports that she does not use drugs.   Family History:  The patient's family history includes COPD in her paternal grandfather; Cancer in her maternal grandmother; Colon polyps in her  father; Diabetes in her brother, father, mother, and paternal grandmother; Heart disease in her father, paternal grandfather, and paternal grandmother; Heart murmur in her mother; Hypertension in her brother, brother, and mother.   ROS:  Please see the history of present illness.   All other systems are personally reviewed and negative.   Vitals:   BP 138/86   Pulse (!) 103   Wt 111.8 kg   SpO2 97%   BMI 40.07 kg/m  PHYSICAL EXAM: General:  Well appearing. No respiratory difficulty HEENT: normal Neck: supple. no JVD. Carotids 2+ bilat; no bruits. No lymphadenopathy or thyromegaly appreciated. Cor: PMI nondisplaced. Irregular rhythm, mildly tachy rate. No rubs, gallops or murmurs. Lungs: clear Abdomen: soft, nontender, nondistended. No hepatosplenomegaly. No bruits or masses. Good bowel sounds. Extremities: no cyanosis, clubbing, rash, edema Neuro: alert & oriented x 3, cranial nerves grossly intact. moves all 4 extremities w/o difficulty. Affect pleasant.   Recent Labs: 12/05/2019: TSH 4.486 12/06/2019: ALT 9 12/09/2019: BUN 35; Creatinine, Ser 1.44; Hemoglobin 9.6; Platelets 340; Potassium 5.3; Sodium 133  Personally  reviewed   Wt Readings from Last 3 Encounters:  12/19/19 111.8 kg  12/05/19 115.1 kg  11/14/18 115.1 kg      ASSESSMENT AND PLAN:  1. H/O PE: Submassive on CT. Etiology uncertain: no known cancer, long trip, recent surgery.  Possibly related to stasis in the setting of cardiomyopathy with severely decreased systolic function.  - Given cardiomyopathy and submassive PE with no certain trigger, she has been continued on long-term anticoagulation. Remains on Xarelto.  2. Chronic systolic => diastolic CHF: Nonischemic cardiomyopathy.  ECHO 12/2015 EF 15-20% with diffuse hypokinesis. Uncertain etiology. No chest pain. Symptoms began as orthopnea around 6/17, then developed progressive exertional dyspnea. ECG shows anteroseptal MI but no change from 2013 ECG. Cardiac MRI did not show LGE. HIV negative, thyroid indices ok (mild TSH elevation but normal T3/T4), SPEP negative.  12/17 RHC/LHC showed no angiographic CAD, optimized filling pressures, low but not markedly low cardiac output.  Echo 3/18 showed recovery of LV function with EF up to 55%. Echo repeated 7/20 and EF still 55-60%. Now w/ recent COVID infection, tested + 2 weeks ago on 8/5 c/b new atrial flutter w/ RVR (persistent). Luckily, she is w/o evidence of gross volume overload and her wt is c/w previous dry wt, but remains symptomatic w/ exertional dyspnea (likely multifactorial from rapid atrial flutter, baseline COPD + residual effects of COVID).   - Plan TEE/DCCV early next week w/ Dr. Shirlee Latch  - Continue Toprol XL 200 mg daily  - Continue Entresto 49/51 bid.  Will avoid titration today given risk of hypotension w/ rapid atrial flutter.  - Off spironolactone with persistent hyperkalemia.   - Continue Lasix 40 mg every other day.  - Check BMP today  - will hold off on repeat echo, since she is out of rhythm  2. Persistent Atrial Flutter: in the setting of recent COVID infection and known pulmonary disease. V-rate 100 bpm on EKG today.  BP normotensive. TSH WNL. On Xarelto for h/o PE. Thinks she may have missed a dose in the last 30 days.  - Plan TEE/DCCV early next week w/ Dr. Shirlee Latch  - Check BMP today  3. CKD: Stage III.  check BMP .  4. Active smoker: She failed Chantix.  She has cut down considerably since being discharged from hospital w/ COVID, down from 1ppd>>1-2 cigs/day 5. COVID 19 Infection  + test 12/05/19, not vaccinated. Hospitalized but did not require  intubation. Continues w/ SOB but no fever   F/u in 1-2 weeks post TEE/DCCV w/ APP for repeat EKG.   Signed, Robbie Lis, PA-C  12/19/2019  [facility] [address] [facility] [place] [address] [phone] (office) [phone] (fax)

## 2019-12-19 NOTE — Patient Instructions (Signed)
Lab work done today. We will notify you of any abnormal lab work. No news is good news!  Your physician has recommended that you have a Cardioversion (DCCV). Electrical Cardioversion uses a jolt of electricity to your heart either through paddles or wired patches attached to your chest. This is a controlled, usually prescheduled, procedure. Defibrillation is done under light anesthesia in the hospital, and you usually go home the day of the procedure. This is done to get your heart back into a normal rhythm. You are not awake for the procedure. Please see the instruction sheet given to you today.  Please follow up with the [facility] in 2-3 weeks.   At the [facility], you and your health needs are our priority. As part of our continuing mission to provide you with exceptional heart care, we have created designated Provider Care Teams. These Care Teams include your primary Cardiologist (physician) and Advanced Practice Providers (APPs- Physician Assistants and Nurse Practitioners) who all work together to provide you with the care you need, when you need it.   You may see any of the following providers on your designated Care Team at your next follow up: Marland Kitchen Dr Arvilla Meres . Dr Marca Ancona . Tonye Becket, NP . Robbie Lis, PA . Karle Plumber, PharmD   Please be sure to bring in all your medications bottles to every appointment.

## 2019-12-24 ENCOUNTER — Encounter (HOSPITAL_COMMUNITY): Payer: Self-pay | Admitting: Cardiology

## 2019-12-24 ENCOUNTER — Telehealth (HOSPITAL_COMMUNITY): Payer: Self-pay | Admitting: *Deleted

## 2019-12-24 NOTE — Telephone Encounter (Signed)
Endo called to inform Dr.McLean that patient would need to r/s TEE/DCCV. Procedure scheduled for tomorrow 8/25 but patient tested positive for covid on 12/05/19 she needs to be 21 days out from a positive covid test before she can have procedure. I called pt to r/s but no answer/left detailed message requesting pt return my call.

## 2019-12-25 ENCOUNTER — Telehealth (HOSPITAL_COMMUNITY): Payer: Self-pay | Admitting: Cardiology

## 2019-12-25 ENCOUNTER — Ambulatory Visit (HOSPITAL_COMMUNITY): Admission: RE | Admit: 2019-12-25 | Payer: Medicare Other | Source: Home / Self Care | Admitting: Cardiology

## 2019-12-25 ENCOUNTER — Encounter (HOSPITAL_COMMUNITY): Admission: RE | Payer: Self-pay | Source: Home / Self Care

## 2019-12-25 SURGERY — ECHOCARDIOGRAM, TRANSESOPHAGEAL
Anesthesia: General

## 2019-12-25 NOTE — Telephone Encounter (Signed)
Patient called to r/s her procedures that were scheduled today, she can be reached [phone]. thanks

## 2019-12-27 NOTE — Telephone Encounter (Signed)
Called pt back to r/s cath no answer/left vm.

## 2019-12-30 NOTE — Telephone Encounter (Signed)
Tee/dccv r/s pt aware. 9/3 at 9am pt to arrive at 8am.

## 2020-01-01 NOTE — Progress Notes (Signed)
Attempted pre call for endo procedure tomorrow 01/02/20, there was no answer and a generic VM- no message left.

## 2020-01-02 ENCOUNTER — Ambulatory Visit (HOSPITAL_BASED_OUTPATIENT_CLINIC_OR_DEPARTMENT_OTHER)
Admission: RE | Admit: 2020-01-02 | Discharge: 2020-01-02 | Disposition: A | Discharge status: Home / Self Care | Payer: Medicare Other | Source: Ambulatory Visit | Attending: Cardiology | Admitting: Cardiology

## 2020-01-02 ENCOUNTER — Ambulatory Visit (HOSPITAL_COMMUNITY)
Admission: RE | Admit: 2020-01-02 | Discharge: 2020-01-02 | Disposition: A | Discharge status: Home / Self Care | Payer: Medicare Other | Attending: Cardiology | Admitting: Cardiology

## 2020-01-02 ENCOUNTER — Ambulatory Visit (HOSPITAL_COMMUNITY): Payer: Medicare Other | Admitting: Anesthesiology

## 2020-01-02 ENCOUNTER — Encounter (HOSPITAL_COMMUNITY): Payer: Self-pay | Admitting: Cardiology

## 2020-01-02 ENCOUNTER — Encounter (HOSPITAL_COMMUNITY)
Admission: RE | Disposition: A | Discharge status: Home / Self Care | Payer: Self-pay | Source: Home / Self Care | Attending: Cardiology

## 2020-01-02 ENCOUNTER — Other Ambulatory Visit: Payer: Self-pay

## 2020-01-02 DIAGNOSIS — F17210 Nicotine dependence, cigarettes, uncomplicated: Secondary | ICD-10-CM | POA: Insufficient documentation

## 2020-01-02 DIAGNOSIS — Z8616 Personal history of COVID-19: Secondary | ICD-10-CM | POA: Insufficient documentation

## 2020-01-02 DIAGNOSIS — N183 Chronic kidney disease, stage 3 unspecified: Secondary | ICD-10-CM | POA: Diagnosis not present

## 2020-01-02 DIAGNOSIS — G894 Chronic pain syndrome: Secondary | ICD-10-CM | POA: Insufficient documentation

## 2020-01-02 DIAGNOSIS — Z86711 Personal history of pulmonary embolism: Secondary | ICD-10-CM | POA: Diagnosis not present

## 2020-01-02 DIAGNOSIS — Z885 Allergy status to narcotic agent status: Secondary | ICD-10-CM | POA: Insufficient documentation

## 2020-01-02 DIAGNOSIS — I5042 Chronic combined systolic (congestive) and diastolic (congestive) heart failure: Secondary | ICD-10-CM | POA: Diagnosis not present

## 2020-01-02 DIAGNOSIS — I428 Other cardiomyopathies: Secondary | ICD-10-CM | POA: Insufficient documentation

## 2020-01-02 DIAGNOSIS — Z79899 Other long term (current) drug therapy: Secondary | ICD-10-CM | POA: Diagnosis not present

## 2020-01-02 DIAGNOSIS — R0600 Dyspnea, unspecified: Secondary | ICD-10-CM | POA: Diagnosis not present

## 2020-01-02 DIAGNOSIS — I4892 Unspecified atrial flutter: Secondary | ICD-10-CM | POA: Insufficient documentation

## 2020-01-02 DIAGNOSIS — Z7951 Long term (current) use of inhaled steroids: Secondary | ICD-10-CM | POA: Diagnosis not present

## 2020-01-02 DIAGNOSIS — E1122 Type 2 diabetes mellitus with diabetic chronic kidney disease: Secondary | ICD-10-CM | POA: Insufficient documentation

## 2020-01-02 DIAGNOSIS — Z794 Long term (current) use of insulin: Secondary | ICD-10-CM | POA: Diagnosis not present

## 2020-01-02 DIAGNOSIS — I340 Nonrheumatic mitral (valve) insufficiency: Secondary | ICD-10-CM

## 2020-01-02 DIAGNOSIS — I5040 Unspecified combined systolic (congestive) and diastolic (congestive) heart failure: Secondary | ICD-10-CM | POA: Diagnosis not present

## 2020-01-02 DIAGNOSIS — J449 Chronic obstructive pulmonary disease, unspecified: Secondary | ICD-10-CM | POA: Insufficient documentation

## 2020-01-02 DIAGNOSIS — J90 Pleural effusion, not elsewhere classified: Secondary | ICD-10-CM | POA: Diagnosis not present

## 2020-01-02 DIAGNOSIS — I130 Hypertensive heart and chronic kidney disease with heart failure and stage 1 through stage 4 chronic kidney disease, or unspecified chronic kidney disease: Secondary | ICD-10-CM | POA: Diagnosis not present

## 2020-01-02 DIAGNOSIS — Z7901 Long term (current) use of anticoagulants: Secondary | ICD-10-CM | POA: Diagnosis not present

## 2020-01-02 DIAGNOSIS — I110 Hypertensive heart disease with heart failure: Secondary | ICD-10-CM | POA: Diagnosis not present

## 2020-01-02 HISTORY — PX: CARDIOVERSION: SHX1299

## 2020-01-02 HISTORY — PX: TEE WITHOUT CARDIOVERSION: SHX5443

## 2020-01-02 LAB — GLUCOSE, CAPILLARY: Glucose-Capillary: 125 mg/dL — ABNORMAL HIGH (ref 70–99)

## 2020-01-02 SURGERY — ECHOCARDIOGRAM, TRANSESOPHAGEAL
Anesthesia: Monitor Anesthesia Care

## 2020-01-02 MED ORDER — LIDOCAINE 2% (20 MG/ML) 5 ML SYRINGE
INTRAMUSCULAR | Status: DC | PRN
  Administered 2020-01-02: 40 mg via INTRAVENOUS

## 2020-01-02 MED ORDER — SODIUM CHLORIDE 0.9 % IV SOLN: INTRAVENOUS | Status: DC

## 2020-01-02 MED ORDER — BUTAMBEN-TETRACAINE-BENZOCAINE 2-2-14 % EX AERO
INHALATION_SPRAY | CUTANEOUS | Status: DC | PRN
  Administered 2020-01-02: 2 via TOPICAL

## 2020-01-02 MED ORDER — PROPOFOL 500 MG/50ML IV EMUL
INTRAVENOUS | Status: DC | PRN
  Administered 2020-01-02: 100 ug/kg/min via INTRAVENOUS

## 2020-01-02 NOTE — Anesthesia Preprocedure Evaluation (Signed)
Anesthesia Evaluation  Patient identified by MRN, date of birth, ID band Patient awake    Reviewed: Allergy & Precautions, H&P , NPO status , Patient's Chart, lab work & pertinent test results  Airway Mallampati: II   Neck ROM: full    Dental   Pulmonary COPD, Current Smoker and Patient abstained from smoking.,    breath sounds clear to auscultation       Cardiovascular hypertension, +CHF  + dysrhythmias Atrial Fibrillation  Rhythm:irregular Rate:Normal     Neuro/Psych    GI/Hepatic GERD  ,  Endo/Other  diabetes, Type 2  Renal/GU Renal InsufficiencyRenal disease     Musculoskeletal   Abdominal   Peds  Hematology  (+) anemia ,   Anesthesia Other Findings   Reproductive/Obstetrics                             Anesthesia Physical Anesthesia Plan  ASA: III  Anesthesia Plan: MAC   Post-op Pain Management:    Induction: Intravenous  PONV Risk Score and Plan: 1 and Ondansetron, Propofol infusion and Treatment may vary due to age or medical condition  Airway Management Planned: Nasal Cannula  Additional Equipment:   Intra-op Plan:   Post-operative Plan:   Informed Consent: I have reviewed the patients History and Physical, chart, labs and discussed the procedure including the risks, benefits and alternatives for the proposed anesthesia with the patient or authorized representative who has indicated his/her understanding and acceptance.       Plan Discussed with: CRNA, Anesthesiologist and Surgeon  Anesthesia Plan Comments:         Anesthesia Quick Evaluation

## 2020-01-02 NOTE — Transfer of Care (Signed)
Immediate Anesthesia Transfer of Care Note  Patient: Kaitlin Cantrell  Procedure(s) Performed: TRANSESOPHAGEAL ECHOCARDIOGRAM (TEE) (N/A ) CARDIOVERSION (N/A )  Patient Location: Endoscopy Unit  Anesthesia Type:MAC  Level of Consciousness: awake, alert  and oriented  Airway & Oxygen Therapy: Patient Spontanous Breathing and Patient connected to nasal cannula oxygen  Post-op Assessment: Report given to RN and Post -op Vital signs reviewed and stable  Post vital signs: Reviewed and stable  Last Vitals:  Vitals Value Taken Time  BP 120/72 01/02/20 0957  Temp    Pulse 72 01/02/20 0958  Resp 18 01/02/20 0958  SpO2 100 % 01/02/20 0958  Vitals shown include unvalidated device data.  Last Pain:  Vitals:   01/02/20 0807  TempSrc: Oral  PainSc: 0-No pain         Complications: No complications documented.

## 2020-01-02 NOTE — CV Procedure (Signed)
Procedure: TEE  Sedation: Per anesthesiology.   Indication: Atrial flutter.   Findings: Please see echo section for full report.  Normal LV size with mild LV hypertrophy.  EF 55%.  Normal RV size and systolic function.  Moderate left atrial enlargement, no LA appendage thrombus. Moderate right atrial enlargement.  No PFO or ASD by color doppler.  Mild TR, peak RV-RA gradient 31 mmHg.  Mild mitral regurgitation.  Trileaflet, mildly calcified aortic valve with no stenosis or regurgitation.  Normal caliber aorta with mild plaque in the descending thoracic aorta.   May proceed to DCCV.   Marca Ancona 01/02/2020 9:54 AM

## 2020-01-02 NOTE — Discharge Instructions (Signed)
TEE  YOU HAD AN CARDIAC PROCEDURE TODAY: Refer to the procedure report and other information in the discharge instructions given to you for any specific questions about what was found during the examination. If this information does not answer your questions, please call [facility] office at [phone] to clarify.   DIET: Your first meal following the procedure should be a light meal and then it is ok to progress to your normal diet. A half-sandwich or bowl of soup is an example of a good first meal. Heavy or fried foods are harder to digest and may make you feel nauseous or bloated. Drink plenty of fluids but you should avoid alcoholic beverages for 24 hours. If you had a esophageal dilation, please see attached instructions for diet.   ACTIVITY: Your care partner should take you home directly after the procedure. You should plan to take it easy, moving slowly for the rest of the day. You can resume normal activity the day after the procedure however YOU SHOULD NOT DRIVE, use power tools, machinery or perform tasks that involve climbing or major physical exertion for 24 hours (because of the sedation medicines used during the test).   SYMPTOMS TO REPORT IMMEDIATELY: A cardiologist can be reached at any hour. Please call [phone] for any of the following symptoms:  Vomiting of blood or coffee ground material  New, significant abdominal pain  New, significant chest pain or pain under the shoulder blades  Painful or persistently difficult swallowing  New shortness of breath  Black, tarry-looking or red, bloody stools  FOLLOW UP:  Please also call with any specific questions about appointments or follow up tests.       Electrical Cardioversion   Follow these instructions at home:  Do not drive for 24 hours if you were given a sedative during your procedure.  Take over-the-counter and prescription medicines only as told by your health care provider.  Ask your health care  provider how to check your pulse. Check it often.  Rest for 48 hours after the procedure or as told by your health care provider.  Avoid or limit your caffeine use as told by your health care provider.  Keep all follow-up visits as told by your health care provider. This is important. Contact a health care provider if:  You feel like your heart is beating too quickly or your pulse is not regular.  You have a serious muscle cramp that does not go away. Get help right away if:  You have discomfort in your chest.  You are dizzy or you feel faint.  You have trouble breathing or you are short of breath.  Your speech is slurred.  You have trouble moving an arm or leg on one side of your body.  Your fingers or toes turn cold or blue. Summary  Electrical cardioversion is the delivery of a jolt of electricity to restore a normal rhythm to the heart.  This procedure may be done right away in an emergency or may be a scheduled procedure if the condition is not an emergency.  Generally, this is a safe procedure.  After the procedure, check your pulse often as told by your health care provider. This information is not intended to replace advice given to you by your health care provider. Make sure you discuss any questions you have with your health care provider. Document Revised: 11/19/2018 Document Reviewed: 11/19/2018 Elsevier Patient Education  2020 [employer].

## 2020-01-02 NOTE — Anesthesia Procedure Notes (Signed)
Procedure Name: MAC Date/Time: 01/02/2020 9:25 AM Performed by: Alain Marion, CRNA Pre-anesthesia Checklist: Patient identified, Emergency Drugs available, Suction available and Patient being monitored Patient Re-evaluated:Patient Re-evaluated prior to induction Oxygen Delivery Method: Nasal cannula Placement Confirmation: positive ETCO2

## 2020-01-02 NOTE — Progress Notes (Signed)
  Echocardiogram Echocardiogram Transesophageal has been performed.  Kaitlin Cantrell 01/02/2020, 10:08 AM

## 2020-01-02 NOTE — Interval H&P Note (Signed)
History and Physical Interval Note:  01/02/2020 9:31 AM  Kaitlin Cantrell  has presented today for surgery, with the diagnosis of AFLUTTER.  The various methods of treatment have been discussed with the patient and family. After consideration of risks, benefits and other options for treatment, the patient has consented to  Procedure(s): TRANSESOPHAGEAL ECHOCARDIOGRAM (TEE) (N/A) CARDIOVERSION (N/A) as a surgical intervention.  The patient's history has been reviewed, patient examined, no change in status, stable for surgery.  I have reviewed the patient's chart and labs.  Questions were answered to the patient's satisfaction.     Valery Amedee [employer]

## 2020-01-02 NOTE — Procedures (Signed)
Electrical Cardioversion Procedure Note KAITLYNE FRIEDHOFF [id number] 1955-07-27  Procedure: Electrical Cardioversion Indications:  Atrial Flutter  Procedure Details Consent: Risks of procedure as well as the alternatives and risks of each were explained to the (patient/caregiver).  Consent for procedure obtained. Time Out: Verified patient identification, verified procedure, site/side was marked, verified correct patient position, special equipment/implants available, medications/allergies/relevent history reviewed, required imaging and test results available.  Performed  Patient placed on cardiac monitor, pulse oximetry, supplemental oxygen as necessary.  Sedation given: Propofol per anesthesiology Pacer pads placed anterior and posterior chest.  Cardioverted 1 time(s).  Cardioverted at 150J.  Evaluation Findings: Post procedure EKG shows: NSR Complications: None Patient did tolerate procedure well.   Marca Ancona 01/02/2020, 9:54 AM

## 2020-01-03 NOTE — Anesthesia Postprocedure Evaluation (Signed)
Anesthesia Post Note  Patient: Kaitlin Cantrell  Procedure(s) Performed: TRANSESOPHAGEAL ECHOCARDIOGRAM (TEE) (N/A ) CARDIOVERSION (N/A )     Patient location during evaluation: Endoscopy Anesthesia Type: MAC Level of consciousness: awake and alert Pain management: pain level controlled Vital Signs Assessment: post-procedure vital signs reviewed and stable Respiratory status: spontaneous breathing, nonlabored ventilation, respiratory function stable and patient connected to nasal cannula oxygen Cardiovascular status: blood pressure returned to baseline and stable Postop Assessment: no apparent nausea or vomiting Anesthetic complications: no   No complications documented.  Last Vitals:  Vitals:   01/02/20 0959 01/02/20 1009  BP: 120/72 116/60  Pulse: 70 74  Resp: 14 (!) 21  Temp: (!) 36.4 C   SpO2: 100% 100%    Last Pain:  Vitals:   01/02/20 1009  TempSrc:   PainSc: 0-No pain                 Marketa Midkiff S

## 2020-01-05 ENCOUNTER — Encounter (HOSPITAL_COMMUNITY): Payer: Self-pay | Admitting: Cardiology

## 2020-01-07 ENCOUNTER — Telehealth (HOSPITAL_COMMUNITY): Payer: Self-pay | Admitting: Cardiology

## 2020-01-07 MED ORDER — METOPROLOL SUCCINATE ER 200 MG PO TB24
200.0000 mg | ORAL_TABLET | Freq: Every day | ORAL | 2 refills | Status: DC
Start: 2020-01-07 — End: 2020-04-06

## 2020-01-07 NOTE — Telephone Encounter (Signed)
Patient called to request refill on metroprolol

## 2020-01-15 DIAGNOSIS — Z0001 Encounter for general adult medical examination with abnormal findings: Secondary | ICD-10-CM | POA: Diagnosis not present

## 2020-01-15 DIAGNOSIS — I110 Hypertensive heart disease with heart failure: Secondary | ICD-10-CM | POA: Diagnosis not present

## 2020-01-15 DIAGNOSIS — I10 Essential (primary) hypertension: Secondary | ICD-10-CM | POA: Diagnosis not present

## 2020-01-15 DIAGNOSIS — E11610 Type 2 diabetes mellitus with diabetic neuropathic arthropathy: Secondary | ICD-10-CM | POA: Diagnosis not present

## 2020-01-15 DIAGNOSIS — E785 Hyperlipidemia, unspecified: Secondary | ICD-10-CM | POA: Diagnosis not present

## 2020-01-15 DIAGNOSIS — I429 Cardiomyopathy, unspecified: Secondary | ICD-10-CM | POA: Diagnosis not present

## 2020-01-15 DIAGNOSIS — I5042 Chronic combined systolic (congestive) and diastolic (congestive) heart failure: Secondary | ICD-10-CM | POA: Diagnosis not present

## 2020-01-15 DIAGNOSIS — J449 Chronic obstructive pulmonary disease, unspecified: Secondary | ICD-10-CM | POA: Diagnosis not present

## 2020-01-16 ENCOUNTER — Other Ambulatory Visit: Payer: Self-pay | Admitting: Family Medicine

## 2020-01-16 ENCOUNTER — Ambulatory Visit (HOSPITAL_COMMUNITY)
Admission: RE | Admit: 2020-01-16 | Discharge: 2020-01-16 | Disposition: A | Discharge status: Home / Self Care | Payer: Medicare Other | Source: Ambulatory Visit | Attending: Cardiology | Admitting: Cardiology

## 2020-01-16 ENCOUNTER — Other Ambulatory Visit: Payer: Self-pay

## 2020-01-16 ENCOUNTER — Encounter (HOSPITAL_COMMUNITY): Payer: Self-pay

## 2020-01-16 ENCOUNTER — Ambulatory Visit
Admission: RE | Admit: 2020-01-16 | Discharge: 2020-01-16 | Disposition: A | Discharge status: Home / Self Care | Payer: Medicare Other | Source: Ambulatory Visit | Attending: Family Medicine | Admitting: Family Medicine

## 2020-01-16 VITALS — BP 136/80 | HR 64 | Ht 66.0 in | Wt 251.0 lb

## 2020-01-16 DIAGNOSIS — Z8616 Personal history of COVID-19: Secondary | ICD-10-CM | POA: Diagnosis not present

## 2020-01-16 DIAGNOSIS — Z794 Long term (current) use of insulin: Secondary | ICD-10-CM | POA: Diagnosis not present

## 2020-01-16 DIAGNOSIS — Z885 Allergy status to narcotic agent status: Secondary | ICD-10-CM | POA: Diagnosis not present

## 2020-01-16 DIAGNOSIS — I130 Hypertensive heart and chronic kidney disease with heart failure and stage 1 through stage 4 chronic kidney disease, or unspecified chronic kidney disease: Secondary | ICD-10-CM | POA: Insufficient documentation

## 2020-01-16 DIAGNOSIS — Z79899 Other long term (current) drug therapy: Secondary | ICD-10-CM | POA: Insufficient documentation

## 2020-01-16 DIAGNOSIS — F17210 Nicotine dependence, cigarettes, uncomplicated: Secondary | ICD-10-CM | POA: Diagnosis not present

## 2020-01-16 DIAGNOSIS — I4811 Longstanding persistent atrial fibrillation: Secondary | ICD-10-CM | POA: Diagnosis not present

## 2020-01-16 DIAGNOSIS — Z825 Family history of asthma and other chronic lower respiratory diseases: Secondary | ICD-10-CM | POA: Diagnosis not present

## 2020-01-16 DIAGNOSIS — N183 Chronic kidney disease, stage 3 unspecified: Secondary | ICD-10-CM | POA: Diagnosis not present

## 2020-01-16 DIAGNOSIS — R0602 Shortness of breath: Secondary | ICD-10-CM

## 2020-01-16 DIAGNOSIS — I5032 Chronic diastolic (congestive) heart failure: Secondary | ICD-10-CM | POA: Diagnosis not present

## 2020-01-16 DIAGNOSIS — J449 Chronic obstructive pulmonary disease, unspecified: Secondary | ICD-10-CM

## 2020-01-16 DIAGNOSIS — Z7901 Long term (current) use of anticoagulants: Secondary | ICD-10-CM | POA: Diagnosis not present

## 2020-01-16 DIAGNOSIS — Z86711 Personal history of pulmonary embolism: Secondary | ICD-10-CM | POA: Diagnosis not present

## 2020-01-16 DIAGNOSIS — E1122 Type 2 diabetes mellitus with diabetic chronic kidney disease: Secondary | ICD-10-CM | POA: Insufficient documentation

## 2020-01-16 DIAGNOSIS — G894 Chronic pain syndrome: Secondary | ICD-10-CM | POA: Insufficient documentation

## 2020-01-16 DIAGNOSIS — Z8249 Family history of ischemic heart disease and other diseases of the circulatory system: Secondary | ICD-10-CM | POA: Insufficient documentation

## 2020-01-16 DIAGNOSIS — I428 Other cardiomyopathies: Secondary | ICD-10-CM | POA: Insufficient documentation

## 2020-01-16 DIAGNOSIS — I4819 Other persistent atrial fibrillation: Secondary | ICD-10-CM | POA: Diagnosis present

## 2020-01-16 NOTE — Progress Notes (Signed)
Date:  01/16/2020   ID:  [name], DOB 29-Mar-1956, MRN [id number]   Provider location: [facility] Type of Visit: Established patient   PCP:  Sherren Mocha, MD  Cardiologist:  Dr. Shirlee Latch  Chief Complaint: F/u post TEE/DCCV    History of Present Illness: Kaitlin Cantrell is a 64 y.o. female who has a history of COPD, tobacco abuse, DM, HTN, arthritis, and chronic pain syndrome and presented to [facility] with SOB in 8/17.  She had had several months of increasing exertional dyspnea prior to this.  CT angio 12/29/15 showed submassive PE with concerns for right heart strain. Now on Xarelto.  Echo 12/30/15 showed LVEF 20-25%, Grade 3 DD, Mod MR, mod LAE, Mild RV dilation with mildly reduced systolic function, Moderate RAE, Moderate TR. Peak PA pressure 54 mm Hg.  Cardiac MRI (9/17) showed EF 15%, moderate LV dilation, mild RV dilation/moderately decreased RV systolic function, no LGE.  She was diuresed in the hospital and begun on Xarelto.  Smokes 1 ppd for many years.  She tried Chantix but did not stop. No ETOH or drug use. No history of cancer or recent surgery, no long car/plane trips prior to her PE.   In 9/17, spironolactone was discontinued due to persistent hyperkalemia.  RHC/LHC in 12/17 showed no significant CAD, optimized filling pressures, low but not markedly low cardiac output.   Echo in 3/18 showed  EF improved to 55% with mild LVH.  Repeat echo 7/20, EF 55-60%.    She was admitted 8/21 for COVID PNA. Hospital course c/b atrial flutter w/ RVR. She did not require intubation. She was seen by general cardiology w/ recommendations to treat w/ rate control w/ metoprolol + continuation of Xarelto. Recommend eventual DCCV once infection cleared.   I saw her for post hospital f/u. She was still in aflutter that was rate controlled and she was symptomatic w/ fatigue. Fluid status was ok. She had missed a dose or 2 of Xarelto. I set her up for TEE/DCCV.     She had successful TEE/DCCV on 9/2. TEE showed normal EF 55%. No LA thrombus. She was cardioverted after 1 shock.   Returns to clinic today for f/u. EKG shows sinus brady 57 bpm. She feels better. Fatigued resolved. No LEE. Wt stable. Still complains of DOE. No CP. Chronic smoker. Has COPD. She saw her PCP yesterday and CXR was ordered. Study was done earlier today and shows a 7 mm nodular opacity in right lower lung region seen only on the frontal view. Radiologist advises noncontrast enhanced chest CT to further assess this area.  She is fully compliant w/ Xarelto.     PMH: 1. PE: 12/2015 CT angio 12/29/15 submassive PE with concerns for right heart strain. Now on Xarelto.  2. Cardiomyopathy:  Found 8/17.  - CMRI 01/01/2016: Nonischemic cardiomyopathy.  EF 15%, moderate LV dilation, mild RV dilation/moderately decreased RV systolic function.  No LGE: no infiltrative or inflammatory process noted, no evidence for prior MI. - ECHO  12/30/2015: EF 20-25%. Grade III DD, Mod TR, PASP 54, mildly decreased RV systolic function.  - Hyperkalemia with spironolactone.  - LHC/RHC (12/17): No CAD, mean RA 2, PA 39/13 mean 23, mean PCWP 7, CI 2.1.  - Echo (3/18): EF 55%, mild LVH.  - Echo (7/20): EF 55-60%, normal RV.  3. ABIs 01/05/2016 normal   4. COPD: Active smoker.  5. Type II diabetes 6. HTN 7. Chronic pain syndrome 8. CKD  stage 3: Suspect diabetic nephropathy.  9. Atrial Flutter   Labs (9/17): SPEP negative, HIV negative, K 4.4, creatinine 1.22 Labs (10/17): LDL 111, HDL 32, TSH normal, K 5.2, creatinine 1.49, digoxin 0.7, BNP 245 Labs (12/17): K 4.4, creatinine 1.46 => 1.5, digoxin 0.9 Labs (1/18): K 4.6, creatinine 1.37, digoxin 1.3, LDL 83 Labs (9/18): K 4.9, creatinine 1.28  Labs (11/19): K 5.2, creatinine 1.5 Labs (4/20): LDL 91, K 5, creatinine 1.59 Labs (8/21): K 5.3, SCr 1.44, hgb 10.8, TSH 4.5   Current Outpatient Medications  Medication Sig Dispense Refill  . atorvastatin  (LIPITOR) 80 MG tablet TAKE 1 TABLET BY MOUTH ONCE DAILY AT  6PM (Patient taking differently: Take 80 mg by mouth daily at 6 PM. ) 90 tablet 0  . budesonide-formoterol (SYMBICORT) 160-4.5 MCG/ACT inhaler Inhale 1 puff into the lungs 2 (two) times daily. (Patient taking differently: Inhale 1 puff into the lungs 2 (two) times daily as needed (wheezing, shortness of breath). ) 3 Inhaler 4  . cyclobenzaprine (FLEXERIL) 10 MG tablet Take 1 tablet (10 mg total) by mouth 3 (three) times daily as needed. for muscle spams (Patient taking differently: Take 10 mg by mouth 3 (three) times daily as needed for muscle spasms. ) 270 tablet 1  . furosemide (LASIX) 40 MG tablet TAKE 1 TABLET BY MOUTH EVERY MONDAY, WEDNESDAY, FRIDAY, AND SATURDAY (Patient taking differently: Take 40 mg by mouth 4 (four) times a week. Every Monday, Wednesday, Friday, and Saturday) 50 tablet 0  . gabapentin (NEURONTIN) 400 MG capsule Take 1 capsule (400 mg total) by mouth 3 (three) times daily. 270 capsule 1  . glimepiride (AMARYL) 4 MG tablet Take 1 tablet by mouth twice daily 180 tablet 0  . HYDROcodone-acetaminophen (NORCO) 7.5-325 MG tablet Take 1 tablet by mouth every 8 (eight) hours as needed for pain.    . Insulin Glargine (LANTUS SOLOSTAR) 100 UNIT/ML Solostar Pen Inject 15 Units into the skin daily at 10 pm. 5 pen 1  . Insulin Pen Needle (PEN NEEDLES) 32G X 4 MM MISC 1 Units by Does not apply route daily. 30 each 1  . Krill Oil CAPS Take 1 capsule by mouth daily.     . metFORMIN (GLUCOPHAGE) 850 MG tablet TAKE 1 TABLET BY MOUTH TWICE DAILY WITH A MEAL (Patient taking differently: Take 850 mg by mouth 2 (two) times daily with a meal. ) 90 tablet 1  . metoprolol (TOPROL-XL) 200 MG 24 hr tablet Take 1 tablet (200 mg total) by mouth daily. Take with or immediately following a meal. 30 tablet 2  . sacubitril-valsartan (ENTRESTO) 49-51 MG Take 1 tablet by mouth 2 (two) times daily. Needs appt for further refills 180 tablet 0  . traMADol  (ULTRAM) 50 MG tablet Take 2 tablets (100 mg total) by mouth every 6 (six) hours as needed for moderate pain. 240 tablet 1  . XARELTO 20 MG TABS tablet Take 1 tablet by mouth once daily (Patient taking differently: Take 20 mg by mouth daily. ) 90 tablet 3  . guaiFENesin-dextromethorphan (ROBITUSSIN DM) 100-10 MG/5ML syrup Take 5 mLs by mouth every 4 (four) hours as needed for cough. (Patient not taking: Reported on 01/16/2020) 118 mL 0   No current facility-administered medications for this encounter.    Allergies:   Albuterol and Morphine and related   Social History:  The patient  reports that she has been smoking cigarettes. She has a 45.00 pack-year smoking history. She has never used smokeless tobacco. She reports  current alcohol use. She reports that she does not use drugs.   Family History:  The patient's family history includes COPD in her paternal grandfather; Cancer in her maternal grandmother; Colon polyps in her father; Diabetes in her brother, father, mother, and paternal grandmother; Heart disease in her father, paternal grandfather, and paternal grandmother; Heart murmur in her mother; Hypertension in her brother, brother, and mother.   ROS:  Please see the history of present illness.   All other systems are personally reviewed and negative.   Vitals:   BP 136/80 (BP Location: Left Arm, Patient Position: Sitting, Cuff Size: Normal)   Pulse 64   Ht 5\' 6"  (1.676 m)   Wt 113.9 kg   SpO2 96%   BMI 40.51 kg/m   PHYSICAL EXAM: General:  Looks much older than actual age, moderately obese. No respiratory difficulty HEENT: normal Neck: supple. no JVD. Carotids 2+ bilat; no bruits. No lymphadenopathy or thyromegaly appreciated. Cor: PMI nondisplaced. Regular rate & rhythm. No rubs, gallops or murmurs. Lungs: clear  Abdomen: obese soft, nontender, nondistended. No hepatosplenomegaly. No bruits or masses. Good bowel sounds. Extremities: no cyanosis, clubbing, rash, edema, Bilateral  varicose veins  Neuro: alert & oriented x 3, cranial nerves grossly intact. moves all 4 extremities w/o difficulty. Affect pleasant.  EKG: Sinus brady, 57 bpm.   Recent Labs: 12/05/2019: TSH 4.486 12/06/2019: ALT 9 12/19/2019: BUN 23; Creatinine, Ser 1.58; Hemoglobin 10.0; Platelets 327; Potassium 5.3; Sodium 136  Personally reviewed   Wt Readings from Last 3 Encounters:  01/16/20 113.9 kg  01/02/20 110.7 kg  12/19/19 111.8 kg      ASSESSMENT AND PLAN:  1. H/O PE: Submassive on CT. Etiology uncertain: no known cancer, long trip, recent surgery.  Possibly related to stasis in the setting of cardiomyopathy with severely decreased systolic function.  - Given cardiomyopathy and submassive PE with no certain trigger, she has been continued on long-term anticoagulation. Remains on Xarelto.  2. Chronic systolic => diastolic CHF: Nonischemic cardiomyopathy.  ECHO 12/2015 EF 15-20% with diffuse hypokinesis. Uncertain etiology. No chest pain. Symptoms began as orthopnea around 6/17, then developed progressive exertional dyspnea. ECG shows anteroseptal MI but no change from 2013 ECG. Cardiac MRI did not show LGE. HIV negative, thyroid indices ok (mild TSH elevation but normal T3/T4), SPEP negative.  12/17 RHC/LHC showed no angiographic CAD, optimized filling pressures, low but not markedly low cardiac output.  Echo 3/18 showed recovery of LV function with EF up to 55%. Echo repeated 7/20 and EF still 55-60%. Now s/p recent COVID infection 8/21 c/b new atrial flutter w/ RVR (persistent, requiring DCCV). Luckily, this did no lead to significant fluid overload/ CHF exacerbation. TEE done at time of DCCV showed normal LVEF, 55%.  - Appears euvolemic on exam, continues w/ chronic mild exertional dyspnea but suspect mostly 2/2 underlying pulmonary disease (chronic smoker, COPD and abnormal CXR needs f/u chest CT to further investigate) - Continue Toprol XL 200 mg daily  - Continue Entresto 49/51 bid.   - Off  spironolactone with persistent hyperkalemia.   - Continue Lasix 40 mg every other day.  - labs checked at PCP office yesterday, will request copy of records  2. Persistent Atrial Flutter s/p DCCV: in the setting of recent COVID infection and known pulmonary disease. S/p successful DCCV 9/2. Maintaining SR on EKG today. HR well controlled.  - Continue Xarelto - Continue metoprolol  3. CKD: Stage III.   - request labs from PCP office  4. Active smoker:  She failed Chantix.  She has cut down considerably since being discharged from hospital w/ COVID, down from 1ppd>>1-2 cigs/day. CXR done yesterday shows a 7 mm nodular opacity in right lower lung region seen only on the frontal view. Radiologist advises noncontrast enhanced chest CT to further assess this area. I discussed this w/ pt today, she will f/u w/ her PCP, who actually ordered the study.  5. COVID 19 Infection  + test 12/05/19, not vaccinated. Hospitalized but did not require intubation. She has recovered.   F/u again in 2-3 months.   Signed, Robbie Lis, PA-C  01/16/2020  [facility] [address] [facility] [place] [address] [phone] (office) [phone] (fax)

## 2020-01-16 NOTE — Patient Instructions (Signed)
It was great to see you today! °No medication changes are needed at this time. ° ° °Your physician recommends that you schedule a follow-up appointment in: 3 months ° °If you have any questions or concerns before your next appointment please send us a message through mychart or call our office at [phone].   ° °TO LEAVE A MESSAGE FOR THE NURSE SELECT OPTION 2, PLEASE LEAVE A MESSAGE INCLUDING: °• YOUR NAME °• DATE OF BIRTH °• CALL BACK NUMBER °• REASON FOR CALL**this is important as we prioritize the call backs ° °YOU WILL RECEIVE A CALL BACK THE SAME DAY AS LONG AS YOU CALL BEFORE 4:00 PM ° ° °

## 2020-01-22 ENCOUNTER — Other Ambulatory Visit: Payer: Self-pay | Admitting: Family Medicine

## 2020-01-24 ENCOUNTER — Other Ambulatory Visit: Payer: Self-pay | Admitting: Family Medicine

## 2020-01-24 DIAGNOSIS — I5042 Chronic combined systolic (congestive) and diastolic (congestive) heart failure: Secondary | ICD-10-CM | POA: Diagnosis not present

## 2020-01-24 DIAGNOSIS — R918 Other nonspecific abnormal finding of lung field: Secondary | ICD-10-CM

## 2020-01-24 DIAGNOSIS — R911 Solitary pulmonary nodule: Secondary | ICD-10-CM | POA: Diagnosis not present

## 2020-01-24 DIAGNOSIS — F17218 Nicotine dependence, cigarettes, with other nicotine-induced disorders: Secondary | ICD-10-CM | POA: Diagnosis not present

## 2020-01-24 DIAGNOSIS — I110 Hypertensive heart disease with heart failure: Secondary | ICD-10-CM | POA: Diagnosis not present

## 2020-01-24 DIAGNOSIS — I429 Cardiomyopathy, unspecified: Secondary | ICD-10-CM | POA: Diagnosis not present

## 2020-01-24 DIAGNOSIS — J449 Chronic obstructive pulmonary disease, unspecified: Secondary | ICD-10-CM | POA: Diagnosis not present

## 2020-01-24 DIAGNOSIS — E11610 Type 2 diabetes mellitus with diabetic neuropathic arthropathy: Secondary | ICD-10-CM | POA: Diagnosis not present

## 2020-01-24 DIAGNOSIS — C9590 Leukemia, unspecified not having achieved remission: Secondary | ICD-10-CM | POA: Diagnosis not present

## 2020-01-31 ENCOUNTER — Other Ambulatory Visit (HOSPITAL_COMMUNITY): Payer: Self-pay | Admitting: Cardiology

## 2020-02-03 ENCOUNTER — Other Ambulatory Visit (HOSPITAL_COMMUNITY): Payer: Self-pay | Admitting: Cardiology

## 2020-02-03 ENCOUNTER — Other Ambulatory Visit: Payer: Self-pay | Admitting: Registered Nurse

## 2020-02-03 DIAGNOSIS — E119 Type 2 diabetes mellitus without complications: Secondary | ICD-10-CM

## 2020-02-04 ENCOUNTER — Ambulatory Visit
Admission: RE | Admit: 2020-02-04 | Discharge: 2020-02-04 | Disposition: A | Discharge status: Home / Self Care | Payer: Medicare Other | Source: Ambulatory Visit | Attending: Family Medicine | Admitting: Family Medicine

## 2020-02-04 DIAGNOSIS — R918 Other nonspecific abnormal finding of lung field: Secondary | ICD-10-CM

## 2020-02-04 NOTE — Telephone Encounter (Signed)
No further refills without office visit 

## 2020-02-04 NOTE — Telephone Encounter (Signed)
Patient need to have an appt for med refills. 30 day has been sent but patient have not been seen in a year

## 2020-02-04 NOTE — Telephone Encounter (Signed)
Requested medication (s) are due for refill today: yes  Requested medication (s) are on the active medication list: yes  Last refill:  10/23/19  Future visit scheduled: no  Notes to clinic:  needs appt-   Requested Prescriptions  Pending Prescriptions Disp Refills   glimepiride (AMARYL) 4 MG tablet [Pharmacy Med Name: Glimepiride 4 MG Oral Tablet] 180 tablet 0    Sig: Take 1 tablet by mouth twice daily      Endocrinology:  Diabetes - Sulfonylureas Failed - 02/03/2020  3:40 PM      Failed - Valid encounter within last 6 months    Recent Outpatient Visits           1 year ago Encounter to establish care   [facility], Richard, NP   1 year ago Type 2 diabetes mellitus without complication, without long-term current use of insulin [facility])   [facility], Levell July, MD   2 years ago Primary osteoarthritis of left knee   [facility], Levell July, MD   2 years ago Encounter for screening mammogram for breast cancer   [facility], Levell July, MD   2 years ago Medication monitoring encounter   [facility], Levell July, MD              Passed - HBA1C is between 0 and 7.9 and within 180 days    Hgb A1c MFr Bld  Date Value Ref Range Status  12/05/2019 7.6 (H) 4.8 - 5.6 % Final    Comment:    (NOTE) Pre diabetes:          5.7%-6.4%  Diabetes:              >6.4%  Glycemic control for   <7.0% adults with diabetes

## 2020-02-04 NOTE — Telephone Encounter (Signed)
Called pt LVMTCB on 02/04/20 @ 9;51 am

## 2020-02-14 ENCOUNTER — Other Ambulatory Visit: Payer: Self-pay | Admitting: Family Medicine

## 2020-02-14 DIAGNOSIS — I10 Essential (primary) hypertension: Secondary | ICD-10-CM | POA: Diagnosis not present

## 2020-02-14 DIAGNOSIS — I429 Cardiomyopathy, unspecified: Secondary | ICD-10-CM | POA: Diagnosis not present

## 2020-02-14 DIAGNOSIS — J449 Chronic obstructive pulmonary disease, unspecified: Secondary | ICD-10-CM | POA: Diagnosis not present

## 2020-02-14 DIAGNOSIS — I110 Hypertensive heart disease with heart failure: Secondary | ICD-10-CM | POA: Diagnosis not present

## 2020-02-14 DIAGNOSIS — D519 Vitamin B12 deficiency anemia, unspecified: Secondary | ICD-10-CM | POA: Diagnosis not present

## 2020-02-14 DIAGNOSIS — I5042 Chronic combined systolic (congestive) and diastolic (congestive) heart failure: Secondary | ICD-10-CM | POA: Diagnosis not present

## 2020-02-14 DIAGNOSIS — E11610 Type 2 diabetes mellitus with diabetic neuropathic arthropathy: Secondary | ICD-10-CM | POA: Diagnosis not present

## 2020-02-14 DIAGNOSIS — E041 Nontoxic single thyroid nodule: Secondary | ICD-10-CM

## 2020-02-20 DIAGNOSIS — I5042 Chronic combined systolic (congestive) and diastolic (congestive) heart failure: Secondary | ICD-10-CM | POA: Diagnosis not present

## 2020-02-20 DIAGNOSIS — J449 Chronic obstructive pulmonary disease, unspecified: Secondary | ICD-10-CM | POA: Diagnosis not present

## 2020-02-20 DIAGNOSIS — E1165 Type 2 diabetes mellitus with hyperglycemia: Secondary | ICD-10-CM | POA: Diagnosis not present

## 2020-02-20 DIAGNOSIS — F17218 Nicotine dependence, cigarettes, with other nicotine-induced disorders: Secondary | ICD-10-CM | POA: Diagnosis not present

## 2020-02-20 DIAGNOSIS — E11610 Type 2 diabetes mellitus with diabetic neuropathic arthropathy: Secondary | ICD-10-CM | POA: Diagnosis not present

## 2020-02-20 DIAGNOSIS — I110 Hypertensive heart disease with heart failure: Secondary | ICD-10-CM | POA: Diagnosis not present

## 2020-02-24 ENCOUNTER — Ambulatory Visit
Admission: RE | Admit: 2020-02-24 | Discharge: 2020-02-24 | Disposition: A | Discharge status: Home / Self Care | Payer: Medicare Other | Source: Ambulatory Visit | Attending: Family Medicine | Admitting: Family Medicine

## 2020-02-24 DIAGNOSIS — E041 Nontoxic single thyroid nodule: Secondary | ICD-10-CM

## 2020-03-04 ENCOUNTER — Other Ambulatory Visit: Payer: Self-pay | Admitting: Registered Nurse

## 2020-03-04 DIAGNOSIS — E119 Type 2 diabetes mellitus without complications: Secondary | ICD-10-CM

## 2020-03-10 ENCOUNTER — Other Ambulatory Visit: Payer: Self-pay | Admitting: Family Medicine

## 2020-03-10 DIAGNOSIS — J449 Chronic obstructive pulmonary disease, unspecified: Secondary | ICD-10-CM | POA: Diagnosis not present

## 2020-03-10 DIAGNOSIS — E041 Nontoxic single thyroid nodule: Secondary | ICD-10-CM

## 2020-03-11 ENCOUNTER — Other Ambulatory Visit (HOSPITAL_COMMUNITY)
Admission: RE | Admit: 2020-03-11 | Discharge: 2020-03-11 | Disposition: A | Discharge status: Home / Self Care | Payer: Medicare Other | Source: Ambulatory Visit | Attending: Family Medicine | Admitting: Family Medicine

## 2020-03-11 ENCOUNTER — Ambulatory Visit
Admission: RE | Admit: 2020-03-11 | Discharge: 2020-03-11 | Disposition: A | Discharge status: Home / Self Care | Payer: Medicare Other | Source: Ambulatory Visit | Attending: Family Medicine | Admitting: Family Medicine

## 2020-03-11 DIAGNOSIS — E041 Nontoxic single thyroid nodule: Secondary | ICD-10-CM

## 2020-03-11 DIAGNOSIS — R896 Abnormal cytological findings in specimens from other organs, systems and tissues: Secondary | ICD-10-CM | POA: Diagnosis not present

## 2020-03-12 LAB — CYTOLOGY - NON PAP

## 2020-03-18 DIAGNOSIS — E119 Type 2 diabetes mellitus without complications: Secondary | ICD-10-CM | POA: Diagnosis not present

## 2020-03-18 DIAGNOSIS — I10 Essential (primary) hypertension: Secondary | ICD-10-CM | POA: Diagnosis not present

## 2020-03-18 DIAGNOSIS — J209 Acute bronchitis, unspecified: Secondary | ICD-10-CM | POA: Diagnosis not present

## 2020-03-18 DIAGNOSIS — R0602 Shortness of breath: Secondary | ICD-10-CM | POA: Diagnosis not present

## 2020-03-18 DIAGNOSIS — E11610 Type 2 diabetes mellitus with diabetic neuropathic arthropathy: Secondary | ICD-10-CM | POA: Diagnosis not present

## 2020-03-18 DIAGNOSIS — E785 Hyperlipidemia, unspecified: Secondary | ICD-10-CM | POA: Diagnosis not present

## 2020-03-18 DIAGNOSIS — I110 Hypertensive heart disease with heart failure: Secondary | ICD-10-CM | POA: Diagnosis not present

## 2020-03-18 DIAGNOSIS — I5042 Chronic combined systolic (congestive) and diastolic (congestive) heart failure: Secondary | ICD-10-CM | POA: Diagnosis not present

## 2020-03-20 DIAGNOSIS — E041 Nontoxic single thyroid nodule: Secondary | ICD-10-CM | POA: Diagnosis not present

## 2020-03-31 ENCOUNTER — Encounter (HOSPITAL_COMMUNITY): Payer: Self-pay

## 2020-04-03 ENCOUNTER — Other Ambulatory Visit (HOSPITAL_COMMUNITY): Payer: Self-pay | Admitting: Cardiology

## 2020-04-14 ENCOUNTER — Telehealth (HOSPITAL_COMMUNITY): Payer: Self-pay

## 2020-04-14 NOTE — Telephone Encounter (Signed)
Called patient and LM to reschedule appt on 12/17 to tomorrow (12/15). Patient left message stating that she is unable to come in tomorrow and is okay to move appt to next available. Called back and left message to return call to schedule

## 2020-04-17 ENCOUNTER — Encounter (HOSPITAL_COMMUNITY): Payer: Medicare Other | Admitting: Cardiology

## 2020-04-28 ENCOUNTER — Other Ambulatory Visit (HOSPITAL_COMMUNITY): Payer: Self-pay | Admitting: Cardiology

## 2020-07-10 DIAGNOSIS — I480 Paroxysmal atrial fibrillation: Secondary | ICD-10-CM | POA: Diagnosis not present

## 2020-07-10 DIAGNOSIS — E11610 Type 2 diabetes mellitus with diabetic neuropathic arthropathy: Secondary | ICD-10-CM | POA: Diagnosis not present

## 2020-07-10 DIAGNOSIS — E785 Hyperlipidemia, unspecified: Secondary | ICD-10-CM | POA: Diagnosis not present

## 2020-07-10 DIAGNOSIS — I429 Cardiomyopathy, unspecified: Secondary | ICD-10-CM | POA: Diagnosis not present

## 2020-08-26 DIAGNOSIS — I429 Cardiomyopathy, unspecified: Secondary | ICD-10-CM | POA: Diagnosis not present

## 2020-08-26 DIAGNOSIS — R0602 Shortness of breath: Secondary | ICD-10-CM | POA: Diagnosis not present

## 2020-08-26 DIAGNOSIS — I5042 Chronic combined systolic (congestive) and diastolic (congestive) heart failure: Secondary | ICD-10-CM | POA: Diagnosis not present

## 2020-08-26 DIAGNOSIS — I110 Hypertensive heart disease with heart failure: Secondary | ICD-10-CM | POA: Diagnosis not present

## 2020-12-21 DIAGNOSIS — I480 Paroxysmal atrial fibrillation: Secondary | ICD-10-CM | POA: Diagnosis not present

## 2020-12-21 DIAGNOSIS — I429 Cardiomyopathy, unspecified: Secondary | ICD-10-CM | POA: Diagnosis not present

## 2020-12-21 DIAGNOSIS — E785 Hyperlipidemia, unspecified: Secondary | ICD-10-CM | POA: Diagnosis not present

## 2020-12-21 DIAGNOSIS — R0602 Shortness of breath: Secondary | ICD-10-CM | POA: Diagnosis not present

## 2020-12-21 DIAGNOSIS — I110 Hypertensive heart disease with heart failure: Secondary | ICD-10-CM | POA: Diagnosis not present

## 2020-12-21 DIAGNOSIS — I5042 Chronic combined systolic (congestive) and diastolic (congestive) heart failure: Secondary | ICD-10-CM | POA: Diagnosis not present

## 2020-12-21 DIAGNOSIS — I10 Essential (primary) hypertension: Secondary | ICD-10-CM | POA: Diagnosis not present

## 2020-12-21 DIAGNOSIS — Z0001 Encounter for general adult medical examination with abnormal findings: Secondary | ICD-10-CM | POA: Diagnosis not present

## 2020-12-21 DIAGNOSIS — E11610 Type 2 diabetes mellitus with diabetic neuropathic arthropathy: Secondary | ICD-10-CM | POA: Diagnosis not present

## 2021-03-17 DIAGNOSIS — D519 Vitamin B12 deficiency anemia, unspecified: Secondary | ICD-10-CM | POA: Diagnosis not present

## 2021-03-17 DIAGNOSIS — E11610 Type 2 diabetes mellitus with diabetic neuropathic arthropathy: Secondary | ICD-10-CM | POA: Diagnosis not present

## 2021-03-17 DIAGNOSIS — J449 Chronic obstructive pulmonary disease, unspecified: Secondary | ICD-10-CM | POA: Diagnosis not present

## 2021-03-17 DIAGNOSIS — I10 Essential (primary) hypertension: Secondary | ICD-10-CM | POA: Diagnosis not present

## 2021-03-18 ENCOUNTER — Other Ambulatory Visit: Payer: Self-pay | Admitting: Family Medicine

## 2021-03-18 DIAGNOSIS — R918 Other nonspecific abnormal finding of lung field: Secondary | ICD-10-CM

## 2021-04-09 ENCOUNTER — Other Ambulatory Visit: Payer: Medicare Other

## 2021-12-02 ENCOUNTER — Encounter: Payer: Self-pay | Admitting: Nurse Practitioner

## 2021-12-02 ENCOUNTER — Ambulatory Visit (INDEPENDENT_AMBULATORY_CARE_PROVIDER_SITE_OTHER): Payer: HMO | Admitting: Nurse Practitioner

## 2021-12-02 VITALS — BP 176/96 | HR 78 | Temp 98.7°F | Ht 66.0 in | Wt 260.0 lb

## 2021-12-02 DIAGNOSIS — N183 Chronic kidney disease, stage 3 unspecified: Secondary | ICD-10-CM

## 2021-12-02 DIAGNOSIS — I5041 Acute combined systolic (congestive) and diastolic (congestive) heart failure: Secondary | ICD-10-CM

## 2021-12-02 DIAGNOSIS — E119 Type 2 diabetes mellitus without complications: Secondary | ICD-10-CM

## 2021-12-02 DIAGNOSIS — I10 Essential (primary) hypertension: Secondary | ICD-10-CM

## 2021-12-02 DIAGNOSIS — I4891 Unspecified atrial fibrillation: Secondary | ICD-10-CM

## 2021-12-02 DIAGNOSIS — E782 Mixed hyperlipidemia: Secondary | ICD-10-CM

## 2021-12-02 DIAGNOSIS — Z7689 Persons encountering health services in other specified circumstances: Secondary | ICD-10-CM

## 2021-12-02 DIAGNOSIS — G894 Chronic pain syndrome: Secondary | ICD-10-CM

## 2021-12-02 DIAGNOSIS — I2699 Other pulmonary embolism without acute cor pulmonale: Secondary | ICD-10-CM

## 2021-12-02 DIAGNOSIS — Z6841 Body Mass Index (BMI) 40.0 and over, adult: Secondary | ICD-10-CM

## 2021-12-02 DIAGNOSIS — J439 Emphysema, unspecified: Secondary | ICD-10-CM

## 2021-12-02 MED ORDER — GABAPENTIN 400 MG PO CAPS: 400.0000 mg | ORAL_CAPSULE | Freq: Three times a day (TID) | ORAL | 3 refills | Status: DC

## 2021-12-02 MED ORDER — METFORMIN HCL 850 MG PO TABS: ORAL_TABLET | ORAL | 3 refills | Status: DC

## 2021-12-02 MED ORDER — FUROSEMIDE 40 MG PO TABS: 40.0000 mg | ORAL_TABLET | Freq: Every day | ORAL | 3 refills | Status: DC

## 2021-12-02 MED ORDER — RIVAROXABAN 20 MG PO TABS: 20.0000 mg | ORAL_TABLET | Freq: Every day | ORAL | 3 refills | Status: DC

## 2021-12-02 MED ORDER — LANTUS SOLOSTAR 100 UNIT/ML ~~LOC~~ SOPN: 25.0000 [IU] | PEN_INJECTOR | Freq: Every day | SUBCUTANEOUS | 3 refills | Status: DC

## 2021-12-02 MED ORDER — ATROVENT HFA 17 MCG/ACT IN AERS: 2.0000 | INHALATION_SPRAY | Freq: Four times a day (QID) | RESPIRATORY_TRACT | 11 refills | Status: DC | PRN

## 2021-12-02 MED ORDER — ENTRESTO 49-51 MG PO TABS: 1.0000 | ORAL_TABLET | Freq: Two times a day (BID) | ORAL | 3 refills | Status: DC

## 2021-12-02 MED ORDER — CYCLOBENZAPRINE HCL 10 MG PO TABS: 10.0000 mg | ORAL_TABLET | Freq: Three times a day (TID) | ORAL | 1 refills | Status: DC | PRN

## 2021-12-02 MED ORDER — BLOOD GLUCOSE MONITOR KIT: PACK | 0 refills | Status: DC

## 2021-12-02 MED ORDER — ATORVASTATIN CALCIUM 80 MG PO TABS: ORAL_TABLET | ORAL | 3 refills | Status: DC

## 2021-12-02 MED ORDER — METOPROLOL SUCCINATE ER 200 MG PO TB24: 200.0000 mg | ORAL_TABLET | Freq: Every day | ORAL | 3 refills | Status: DC

## 2021-12-02 NOTE — Progress Notes (Signed)
New Patient Office Visit  Subjective    Patient ID: Kaitlin Cantrell, female    DOB: 15-Mar-1956  Age: 66 y.o. MRN: [id number]  CC:  Chief Complaint  Patient presents with   New Patient (Initial Visit)    Patient presents today to establish care. She needs refills today on medication. Previous patient of Delman Cheadle and she closed practice.  Patients needs refills on: Symbicort Flexeril Lasix Neurontin Amaryl Norco Lantus Metformin Xarelto Entresto tramadol    HPI [name] presents to establish care The patient comes from closed medical practice.  -history of insulin dependant diabetes. She has been off metformin and amaryl for a week. In the meantime, she has increased her lantus to 25 units. She does not check her blood sugars as she does not have meter, test strips, or lancets to check her blood sugars.  -she has history of atrial fibrillation. Had cardioversion done several years ago. Has been out of her  entresto for some time. Has not had follow up appointment with cardiology since she had her cardiology  Had pulmonary embolism in 2016. She has also been out of her xarelto for some time.  -does have chronic low back pain and pain in hips and knees. Was on narcotic pa medications, including norco and tramadol. Has been out of these for some time.  Outpatient Encounter Medications as of 12/02/2021  Medication Sig   blood glucose meter kit and supplies KIT Dispense based on patient and insurance preference. Use up to four times daily as directed.   budesonide-formoterol (SYMBICORT) 160-4.5 MCG/ACT inhaler Inhale 1 puff into the lungs 2 (two) times daily. (Patient taking differently: Inhale 1 puff into the lungs 2 (two) times daily as needed (wheezing, shortness of breath).)   glimepiride (AMARYL) 4 MG tablet Take 1 tablet by mouth twice daily   guaiFENesin-dextromethorphan (ROBITUSSIN DM) 100-10 MG/5ML syrup Take 5 mLs by mouth every 4 (four) hours as needed for  cough.   HYDROcodone-acetaminophen (NORCO) 7.5-325 MG tablet Take 1 tablet by mouth every 8 (eight) hours as needed for pain.   insulin glargine (LANTUS SOLOSTAR) 100 UNIT/ML Solostar Pen Inject 25 Units into the skin daily.   Insulin Pen Needle (PEN NEEDLES) 32G X 4 MM MISC 1 Units by Does not apply route daily.   ipratropium (ATROVENT HFA) 17 MCG/ACT inhaler Inhale 2 puffs into the lungs every 6 (six) hours as needed for wheezing.   Krill Oil CAPS Take 1 capsule by mouth daily.    traMADol (ULTRAM) 50 MG tablet Take 2 tablets (100 mg total) by mouth every 6 (six) hours as needed for moderate pain.   [DISCONTINUED] atorvastatin (LIPITOR) 80 MG tablet TAKE 1 TABLET BY MOUTH ONCE DAILY AT  6PM (Patient taking differently: Take 80 mg by mouth daily at 6 PM.)   [DISCONTINUED] cyclobenzaprine (FLEXERIL) 10 MG tablet Take 1 tablet (10 mg total) by mouth 3 (three) times daily as needed. for muscle spams (Patient taking differently: Take 10 mg by mouth 3 (three) times daily as needed for muscle spasms.)   [DISCONTINUED] ENTRESTO 49-51 MG TAKE 1 TABLET BY MOUTH TWICE DAILY . APPOINTMENT REQUIRED FOR FUTURE REFILLS   [DISCONTINUED] furosemide (LASIX) 40 MG tablet TAKE 1 TABLET BY MOUTH EVERY MONDAY, WEDNESDAY, FRIDAY, AND SATURDAY (Patient taking differently: Take 40 mg by mouth 4 (four) times a week. Every Monday, Wednesday, Friday, and Saturday)   [DISCONTINUED] gabapentin (NEURONTIN) 400 MG capsule Take 1 capsule (400 mg total) by mouth 3 (three)  times daily.   [DISCONTINUED] Insulin Glargine (LANTUS SOLOSTAR) 100 UNIT/ML Solostar Pen Inject 15 Units into the skin daily at 10 pm.   [DISCONTINUED] metFORMIN (GLUCOPHAGE) 850 MG tablet TAKE 1 TABLET BY MOUTH TWICE DAILY WITH A MEAL (Patient taking differently: Take 850 mg by mouth 2 (two) times daily with a meal.)   [DISCONTINUED] metoprolol (TOPROL-XL) 200 MG 24 hr tablet TAKE 1 TABLET BY MOUTH ONCE DAILY WITH  OR  IMMEDIATELY  FOLLOWING  A  MEAL.    [DISCONTINUED] XARELTO 20 MG TABS tablet Take 1 tablet by mouth once daily (Patient taking differently: Take 20 mg by mouth daily.)   atorvastatin (LIPITOR) 80 MG tablet TAKE 1 TABLET BY MOUTH ONCE DAILY AT  6PM   cyclobenzaprine (FLEXERIL) 10 MG tablet Take 1 tablet (10 mg total) by mouth 3 (three) times daily as needed. for muscle spams   furosemide (LASIX) 40 MG tablet Take 1 tablet (40 mg total) by mouth daily. TAKE 1 TABLET BY MOUTH EVERY MONDAY, WEDNESDAY, FRIDAY, AND SATURDAY   gabapentin (NEURONTIN) 400 MG capsule Take 1 capsule (400 mg total) by mouth 3 (three) times daily.   metFORMIN (GLUCOPHAGE) 850 MG tablet TAKE 1 TABLET BY MOUTH TWICE DAILY WITH A MEAL   metoprolol (TOPROL-XL) 200 MG 24 hr tablet Take 1 tablet (200 mg total) by mouth daily.   rivaroxaban (XARELTO) 20 MG TABS tablet Take 1 tablet (20 mg total) by mouth daily.   sacubitril-valsartan (ENTRESTO) 49-51 MG Take 1 tablet by mouth 2 (two) times daily.   No facility-administered encounter medications on file as of 12/02/2021.    Past Medical History:  Diagnosis Date   Allergy    seasonal   Arthritis    Diabetes mellitus    GERD (gastroesophageal reflux disease)    History of pneumonia    15 years ago   Hyperlipidemia    Hypertension    PE (pulmonary embolism) 12/2015    Past Surgical History:  Procedure Laterality Date   ANKLE FRACTURE SURGERY  1974   left and pinned then pins removed   [place]   right   BACK SURGERY  1998   discectomy and then fusion   CARDIAC CATHETERIZATION N/A 04/05/2016   Procedure: Right/Left Heart Cath and Coronary Angiography;  Surgeon: Larey Dresser, MD;  Location: [place] [facility];  Service: Cardiovascular;  Laterality: N/A;   CARDIOVERSION N/A 01/02/2020   Procedure: CARDIOVERSION;  Surgeon: Larey Dresser, MD;  Location: [facility];  Service: Cardiovascular;  Laterality: N/A;   KNEE ARTHROPLASTY  02/14/2012   Procedure: COMPUTER ASSISTED TOTAL  KNEE ARTHROPLASTY;  Surgeon: Meredith Pel, MD;  Location: [place];  Service: Orthopedics;  Laterality: Left;  Left total knee arthroplasty   TEE WITHOUT CARDIOVERSION N/A 01/02/2020   Procedure: TRANSESOPHAGEAL ECHOCARDIOGRAM (TEE);  Surgeon: Larey Dresser, MD;  Location: [facility] ENDOSCOPY;  Service: Cardiovascular;  Laterality: N/A;   TONSILLECTOMY  1972   TUBAL LIGATION  7322   UMBILICAL HERNIA REPAIR  1996    Family History  Problem Relation Age of Onset   Colon polyps Father    Diabetes Father        mother   Heart disease Father    Heart murmur Mother    Diabetes Mother    Hypertension Mother    Diabetes Brother    Cancer Maternal Grandmother    Diabetes Paternal Grandmother    Heart disease Paternal Grandmother    COPD Paternal Grandfather  Heart disease Paternal Grandfather    Hypertension Brother    Hypertension Brother     Social History   Socioeconomic History   Marital status: Married    Spouse name: Not on file   Number of children: 0   Years of education: Not on file   Highest education level: Not on file  Occupational History   Occupation: retired  Tobacco Use   Smoking status: Every Day    Packs/day: 1.00    Years: 45.00    Total pack years: 45.00    Types: Cigarettes   Smokeless tobacco: Never  Vaping Use   Vaping Use: Never used  Substance and Sexual Activity   Alcohol use: Yes    Comment: rarely   Drug use: No   Sexual activity: Not Currently  Other Topics Concern   Not on file  Social History Narrative   Exercise walking daily (varies)   Social Determinants of Health   Financial Resource Strain: Low Risk  (09/17/2018)   Overall Financial Resource Strain (CARDIA)    Difficulty of Paying Living Expenses: Not hard at all  Food Insecurity: No Food Insecurity (09/17/2018)   Hunger Vital Sign    Worried About Running Out of Food in the Last Year: Never true    [place] in the Last Year: Never true  Transportation Needs: No  Transportation Needs (09/17/2018)   PRAPARE - Hydrologist (Medical): No    Lack of Transportation (Non-Medical): No  Physical Activity: Insufficiently Active (09/17/2018)   Exercise Vital Sign    Days of Exercise per Week: 3 days    Minutes of Exercise per Session: 20 min  Stress: No Stress Concern Present (09/17/2018)   [place]    Feeling of Stress : Only a little  Social Connections: Somewhat Isolated (09/17/2018)   Social Connection and Isolation Panel [NHANES]    Frequency of Communication with Friends and Family: More than three times a week    Frequency of Social Gatherings with Friends and Family: More than three times a week    Attends Religious Services: Never    Marine scientist or Organizations: No    Attends Archivist Meetings: Never    Marital Status: Married  Human resources officer Violence: Not At Risk (09/17/2018)   Humiliation, Afraid, Rape, and Kick questionnaire    Fear of Current or Ex-Partner: No    Emotionally Abused: No    Physically Abused: No    Sexually Abused: No    Review of Systems  Constitutional:  Positive for malaise/fatigue. Negative for chills and fever.  HENT:  Negative for congestion, sinus pain and sore throat.   Eyes: Negative.   Respiratory:  Positive for shortness of breath. Negative for cough and wheezing.   Cardiovascular:  Negative for chest pain, palpitations and leg swelling.       Blood pressure very elevated. Has been out of medication for some time.   Gastrointestinal:  Positive for nausea. Negative for constipation, diarrhea and vomiting.  Genitourinary: Negative.   Musculoskeletal:  Positive for back pain, joint pain and myalgias.  Skin: Negative.   Neurological:  Negative for dizziness and headaches.  Endo/Heme/Allergies:  Does not bruise/bleed easily.       Blood sugars are currently uncontrolled.   Psychiatric/Behavioral:   Negative for depression. The patient is not nervous/anxious.         Objective    Today's  Vitals   12/02/21 1410  BP: (!) 176/96  Pulse: 78  Temp: 98.7 F (37.1 C)  SpO2: 94%  Weight: 260 lb (117.9 kg)  Height: '5\' 6"'  (1.676 m)   Body mass index is 41.97 kg/m.   Physical Exam Vitals and nursing note reviewed.  Constitutional:      Appearance: Normal appearance. She is well-developed. She is obese.  HENT:     Head: Normocephalic and atraumatic.     Nose: Nose normal.  Eyes:     Extraocular Movements: Extraocular movements intact.     Conjunctiva/sclera: Conjunctivae normal.     Pupils: Pupils are equal, round, and reactive to light.  Neck:     Vascular: No carotid bruit.  Cardiovascular:     Rate and Rhythm: Normal rate. Rhythm irregular.     Pulses: Normal pulses.     Heart sounds: Murmur heard.  Pulmonary:     Effort: Pulmonary effort is normal.     Breath sounds: Normal breath sounds.  Abdominal:     Palpations: Abdomen is soft.  Musculoskeletal:        General: Normal range of motion.     Cervical back: Normal range of motion and neck supple.  Lymphadenopathy:     Cervical: No cervical adenopathy.  Skin:    General: Skin is warm and dry.     Capillary Refill: Capillary refill takes less than 2 seconds.  Neurological:     General: No focal deficit present.     Mental Status: She is alert and oriented to person, place, and time.  Psychiatric:        Mood and Affect: Mood normal.        Behavior: Behavior normal.        Thought Content: Thought content normal.        Judgment: Judgment normal.       Assessment & Plan:  1. Type 2 diabetes mellitus without complication, without long-term current use of insulin (HCC) Blood sugar uncontrolled at this time.  Restart metformin 800 mg twice daily.  Continue Lantus 25 units daily.  New prescription for glucometer with lancets and test trips sent to her pharmacy.  She should check blood sugars twice daily.   Goal is to have blood sugar between 70 and 100. - metFORMIN (GLUCOPHAGE) 850 MG tablet; TAKE 1 TABLET BY MOUTH TWICE DAILY WITH A MEAL  Dispense: 60 tablet; Refill: 3 - insulin glargine (LANTUS SOLOSTAR) 100 UNIT/ML Solostar Pen; Inject 25 Units into the skin daily.  Dispense: 15 mL; Refill: 3 - blood glucose meter kit and supplies KIT; Dispense based on patient and insurance preference. Use up to four times daily as directed.  Dispense: 1 each; Refill: 0  2. Stage 3 chronic kidney disease ([place]) Patient does take furosemide 40 mg on Monday, Wednesday, Friday, and Saturday. - furosemide (LASIX) 40 MG tablet; Take 1 tablet (40 mg total) by mouth daily. TAKE 1 TABLET BY MOUTH EVERY MONDAY, WEDNESDAY, FRIDAY, AND SATURDAY  Dispense: 50 tablet; Refill: 3  3. Combined systolic and diastolic congestive heart failure (HCC) Restart Entresto twice daily.  Take Toprol XL 200 mg daily.  Refer to new cardiology for further evaluation and management  - sacubitril-valsartan (ENTRESTO) 49-51 MG; Take 1 tablet by mouth 2 (two) times daily.  Dispense: 60 tablet; Refill: 3 - metoprolol (TOPROL-XL) 200 MG 24 hr tablet; Take 1 tablet (200 mg total) by mouth daily.  Dispense: 30 tablet; Refill: 3 - Ambulatory referral to Cardiology  4.  Atrial fibrillation, unspecified type [facility]) Refer tto cardiology for continued evaluation and management.  - Ambulatory referral to Cardiology  5. Primary hypertension Blood pressure very elevated today.  She has been out of her medications for some time.  Restart Toprol XL 200 mg daily.  Advised patient to monitor at home.  Goal is to have blood pressure at 140/80 or better. - metoprolol (TOPROL-XL) 200 MG 24 hr tablet; Take 1 tablet (200 mg total) by mouth daily.  Dispense: 30 tablet; Refill: 3  6. Mixed hyperlipidemia Check fasting lipid panel prior to next visit.  Adjust atorvastatin dosing as indicated. - atorvastatin (LIPITOR) 80 MG tablet; TAKE 1 TABLET BY MOUTH ONCE DAILY  AT  6PM  Dispense: 30 tablet; Refill: 3  7. Chronic pain syndrome Restart gabapentin 400 mg capsules at bedtime.  Patient may titrate to 3 times daily as needed and as tolerated.  She may take cyclobenzaprine 10 mg up to 3 times daily as needed for muscle pain and spasms.  Advised her to use his medications with caution as it may cause dizziness or drowsiness.  She voiced understanding. - cyclobenzaprine (FLEXERIL) 10 MG tablet; Take 1 tablet (10 mg total) by mouth 3 (three) times daily as needed. for muscle spams  Dispense: 270 tablet; Refill: 1 - gabapentin (NEURONTIN) 400 MG capsule; Take 1 capsule (400 mg total) by mouth 3 (three) times daily.  Dispense: 90 capsule; Refill: 3  8. Pulmonary thromboembolism (HCC) Restart Xarelto 20 mg daily. - rivaroxaban (XARELTO) 20 MG TABS tablet; Take 1 tablet (20 mg total) by mouth daily.  Dispense: 30 tablet; Refill: 3  9. Pulmonary emphysema, unspecified emphysema type ([place]) Patient should use Atrovent up to 4 times daily as needed for shortness of breath or wheezing. - ipratropium (ATROVENT HFA) 17 MCG/ACT inhaler; Inhale 2 puffs into the lungs every 6 (six) hours as needed for wheezing.  Dispense: 1 each; Refill: 11  10. BMI 40.0-44.9, adult (HCC) Discussed lowering calorie intake to 1500 calories per day and incorporating gentle exercise into daily routine to help lose weight.    11. Encounter to establish care Appointment today to establish new primary care provider      Problem List Items Addressed This Visit       Cardiovascular and Mediastinum   HTN (hypertension)   Relevant Medications   furosemide (LASIX) 40 MG tablet   rivaroxaban (XARELTO) 20 MG TABS tablet   sacubitril-valsartan (ENTRESTO) 49-51 MG   atorvastatin (LIPITOR) 80 MG tablet   metoprolol (TOPROL-XL) 200 MG 24 hr tablet   Pulmonary thromboembolism (HCC)   Relevant Medications   furosemide (LASIX) 40 MG tablet   rivaroxaban (XARELTO) 20 MG TABS tablet    sacubitril-valsartan (ENTRESTO) 49-51 MG   atorvastatin (LIPITOR) 80 MG tablet   metoprolol (TOPROL-XL) 200 MG 24 hr tablet   Combined systolic and diastolic congestive heart failure (HCC)   Relevant Medications   furosemide (LASIX) 40 MG tablet   rivaroxaban (XARELTO) 20 MG TABS tablet   sacubitril-valsartan (ENTRESTO) 49-51 MG   atorvastatin (LIPITOR) 80 MG tablet   metoprolol (TOPROL-XL) 200 MG 24 hr tablet   Other Relevant Orders   Ambulatory referral to Cardiology   Atrial fibrillation (HCC)   Relevant Medications   furosemide (LASIX) 40 MG tablet   rivaroxaban (XARELTO) 20 MG TABS tablet   sacubitril-valsartan (ENTRESTO) 49-51 MG   atorvastatin (LIPITOR) 80 MG tablet   metoprolol (TOPROL-XL) 200 MG 24 hr tablet   Other Relevant Orders   Ambulatory referral  to Cardiology     Respiratory   Chronic obstructive pulmonary disease (HCC)   Relevant Medications   ipratropium (ATROVENT HFA) 17 MCG/ACT inhaler     Endocrine   Type 2 diabetes mellitus without complication, without long-term current use of insulin (HCC) - Primary   Relevant Medications   atorvastatin (LIPITOR) 80 MG tablet   metFORMIN (GLUCOPHAGE) 850 MG tablet   insulin glargine (LANTUS SOLOSTAR) 100 UNIT/ML Solostar Pen   blood glucose meter kit and supplies KIT     Genitourinary   Stage 3 chronic kidney disease (HCC)   Relevant Medications   furosemide (LASIX) 40 MG tablet     Other   Hyperlipidemia   Relevant Medications   furosemide (LASIX) 40 MG tablet   rivaroxaban (XARELTO) 20 MG TABS tablet   sacubitril-valsartan (ENTRESTO) 49-51 MG   atorvastatin (LIPITOR) 80 MG tablet   metoprolol (TOPROL-XL) 200 MG 24 hr tablet   Chronic pain syndrome   Relevant Medications   cyclobenzaprine (FLEXERIL) 10 MG tablet   gabapentin (NEURONTIN) 400 MG capsule   BMI 40.0-44.9, adult (HCC)   Relevant Medications   metFORMIN (GLUCOPHAGE) 850 MG tablet   insulin glargine (LANTUS SOLOSTAR) 100 UNIT/ML Solostar Pen    Other Visit Diagnoses     Encounter to establish care           Return in about 4 weeks (around 12/30/2021) for blood pressure, FBW a week prior to visit - she is complicated. can she be 40 minutes.   Ronnell Freshwater, NP

## 2021-12-12 DIAGNOSIS — N183 Chronic kidney disease, stage 3 unspecified: Secondary | ICD-10-CM | POA: Insufficient documentation

## 2021-12-12 DIAGNOSIS — I4891 Unspecified atrial fibrillation: Secondary | ICD-10-CM | POA: Insufficient documentation

## 2021-12-12 DIAGNOSIS — I480 Paroxysmal atrial fibrillation: Secondary | ICD-10-CM | POA: Insufficient documentation

## 2021-12-12 DIAGNOSIS — Z6841 Body Mass Index (BMI) 40.0 and over, adult: Secondary | ICD-10-CM | POA: Insufficient documentation

## 2022-01-26 ENCOUNTER — Telehealth: Payer: Self-pay

## 2022-01-26 ENCOUNTER — Other Ambulatory Visit: Payer: Self-pay | Admitting: Nurse Practitioner

## 2022-01-26 ENCOUNTER — Other Ambulatory Visit: Payer: Self-pay

## 2022-01-26 DIAGNOSIS — N183 Chronic kidney disease, stage 3 unspecified: Secondary | ICD-10-CM

## 2022-01-26 MED ORDER — FUROSEMIDE 40 MG PO TABS: 40.0000 mg | ORAL_TABLET | Freq: Every day | ORAL | 3 refills | Status: DC

## 2022-01-26 NOTE — Telephone Encounter (Signed)
Sent Rx to pharmacy 01/26/2022

## 2022-01-26 NOTE — Telephone Encounter (Signed)
Awesome! Thank you so much!

## 2022-01-26 NOTE — Telephone Encounter (Signed)
Patient called to get refill on her furosemide sent to her pharmacy, please advise, thanks!

## 2022-01-27 ENCOUNTER — Telehealth: Payer: Self-pay

## 2022-01-27 ENCOUNTER — Other Ambulatory Visit: Payer: Self-pay | Admitting: Nurse Practitioner

## 2022-01-27 DIAGNOSIS — N183 Chronic kidney disease, stage 3 unspecified: Secondary | ICD-10-CM

## 2022-01-27 MED ORDER — FUROSEMIDE 40 MG PO TABS: 40.0000 mg | ORAL_TABLET | Freq: Every day | ORAL | 1 refills | Status: DC

## 2022-01-27 NOTE — Telephone Encounter (Signed)
Called pt she stated that she takes her Rx every day instead of every other day she wants  another script sent in because she stated she out of her medication. I stated that this medication was already sent in yesterday please advised

## 2022-01-27 NOTE — Telephone Encounter (Signed)
Called pt she is advised of her Rx that was sent to pharmacy 

## 2022-01-27 NOTE — Progress Notes (Signed)
Changed furosemide to everyday dosing and sent 90 day prescription to [facility].

## 2022-01-27 NOTE — Telephone Encounter (Signed)
Patient called about her furosemide, patient states that she now takes medication daily, instead of every Monday, Wednesday, Friday, and Saturday please advise, thanks!

## 2022-01-27 NOTE — Telephone Encounter (Signed)
Changed furosemide to everyday dosing and sent 90 day prescription to [facility].  

## 2022-01-31 ENCOUNTER — Other Ambulatory Visit: Payer: Self-pay | Admitting: Nurse Practitioner

## 2022-01-31 DIAGNOSIS — N183 Chronic kidney disease, stage 3 unspecified: Secondary | ICD-10-CM

## 2022-01-31 MED ORDER — FUROSEMIDE 40 MG PO TABS: 40.0000 mg | ORAL_TABLET | Freq: Every day | ORAL | 1 refills | Status: DC

## 2022-01-31 NOTE — Progress Notes (Signed)
Resent furosemide with daily dosing script to pharmacy.

## 2022-02-01 NOTE — Progress Notes (Unsigned)
Established patient visit   Patient: Kaitlin Cantrell   DOB: Sep 29, 1955   66 y.o. Female  MRN: [id number] Visit Date: 02/02/2022   No chief complaint on file.  Subjective    HPI  Follow up  -hypertension  -moderate to severe hypertension  -does have chronic atrial fib. Sees cardiology  -type 2 diabetes  -stage 3 chronic renal disease  -need for routine fasting labs - will need to be ordered and drawn today    Medications: Outpatient Medications Prior to Visit  Medication Sig   atorvastatin (LIPITOR) 80 MG tablet TAKE 1 TABLET BY MOUTH ONCE DAILY AT  6PM   blood glucose meter kit and supplies KIT Dispense based on patient and insurance preference. Use up to four times daily as directed.   budesonide-formoterol (SYMBICORT) 160-4.5 MCG/ACT inhaler Inhale 1 puff into the lungs 2 (two) times daily. (Patient taking differently: Inhale 1 puff into the lungs 2 (two) times daily as needed (wheezing, shortness of breath).)   cyclobenzaprine (FLEXERIL) 10 MG tablet Take 1 tablet (10 mg total) by mouth 3 (three) times daily as needed. for muscle spams   furosemide (LASIX) 40 MG tablet Take 1 tablet (40 mg total) by mouth daily.   gabapentin (NEURONTIN) 400 MG capsule Take 1 capsule (400 mg total) by mouth 3 (three) times daily.   glimepiride (AMARYL) 4 MG tablet Take 1 tablet by mouth twice daily   guaiFENesin-dextromethorphan (ROBITUSSIN DM) 100-10 MG/5ML syrup Take 5 mLs by mouth every 4 (four) hours as needed for cough.   HYDROcodone-acetaminophen (NORCO) 7.5-325 MG tablet Take 1 tablet by mouth every 8 (eight) hours as needed for pain.   insulin glargine (LANTUS SOLOSTAR) 100 UNIT/ML Solostar Pen Inject 25 Units into the skin daily.   Insulin Pen Needle (PEN NEEDLES) 32G X 4 MM MISC 1 Units by Does not apply route daily.   ipratropium (ATROVENT HFA) 17 MCG/ACT inhaler Inhale 2 puffs into the lungs every 6 (six) hours as needed for wheezing.   Krill Oil CAPS Take 1 capsule by mouth daily.     metFORMIN (GLUCOPHAGE) 850 MG tablet TAKE 1 TABLET BY MOUTH TWICE DAILY WITH A MEAL   metoprolol (TOPROL-XL) 200 MG 24 hr tablet Take 1 tablet (200 mg total) by mouth daily.   rivaroxaban (XARELTO) 20 MG TABS tablet Take 1 tablet (20 mg total) by mouth daily.   sacubitril-valsartan (ENTRESTO) 49-51 MG Take 1 tablet by mouth 2 (two) times daily.   traMADol (ULTRAM) 50 MG tablet Take 2 tablets (100 mg total) by mouth every 6 (six) hours as needed for moderate pain.   No facility-administered medications prior to visit.    Review of Systems  {Labs (Optional):23779}   Objective    There were no vitals taken for this visit. BP Readings from Last 3 Encounters:  12/02/21 (Abnormal) 176/96  01/16/20 136/80  01/02/20 116/60    Wt Readings from Last 3 Encounters:  12/02/21 260 lb (117.9 kg)  01/16/20 251 lb (113.9 kg)  01/02/20 244 lb (110.7 kg)    Physical Exam  ***  No results found for any visits on 02/02/22.  Assessment & Plan     Problem List Items Addressed This Visit   None    No follow-ups on file.         Ronnell Freshwater, NP  [facility] [phone] (phone) [phone] (fax)  [place]

## 2022-02-02 ENCOUNTER — Encounter: Payer: Self-pay | Admitting: Nurse Practitioner

## 2022-02-02 ENCOUNTER — Ambulatory Visit (INDEPENDENT_AMBULATORY_CARE_PROVIDER_SITE_OTHER): Payer: HMO | Admitting: Nurse Practitioner

## 2022-02-02 VITALS — BP 124/73 | HR 61 | Ht 66.0 in | Wt 258.1 lb

## 2022-02-02 DIAGNOSIS — E1169 Type 2 diabetes mellitus with other specified complication: Secondary | ICD-10-CM

## 2022-02-02 DIAGNOSIS — N183 Chronic kidney disease, stage 3 unspecified: Secondary | ICD-10-CM

## 2022-02-02 DIAGNOSIS — E785 Hyperlipidemia, unspecified: Secondary | ICD-10-CM

## 2022-02-02 DIAGNOSIS — R809 Proteinuria, unspecified: Secondary | ICD-10-CM | POA: Diagnosis not present

## 2022-02-02 DIAGNOSIS — Z794 Long term (current) use of insulin: Secondary | ICD-10-CM

## 2022-02-02 DIAGNOSIS — Z23 Encounter for immunization: Secondary | ICD-10-CM | POA: Diagnosis not present

## 2022-02-02 DIAGNOSIS — E041 Nontoxic single thyroid nodule: Secondary | ICD-10-CM | POA: Insufficient documentation

## 2022-02-02 DIAGNOSIS — E1159 Type 2 diabetes mellitus with other circulatory complications: Secondary | ICD-10-CM | POA: Diagnosis not present

## 2022-02-02 DIAGNOSIS — J439 Emphysema, unspecified: Secondary | ICD-10-CM

## 2022-02-02 DIAGNOSIS — Z122 Encounter for screening for malignant neoplasm of respiratory organs: Secondary | ICD-10-CM

## 2022-02-02 DIAGNOSIS — I152 Hypertension secondary to endocrine disorders: Secondary | ICD-10-CM

## 2022-02-02 DIAGNOSIS — E1129 Type 2 diabetes mellitus with other diabetic kidney complication: Secondary | ICD-10-CM | POA: Diagnosis not present

## 2022-02-02 DIAGNOSIS — Z0000 Encounter for general adult medical examination without abnormal findings: Secondary | ICD-10-CM

## 2022-02-02 DIAGNOSIS — F17200 Nicotine dependence, unspecified, uncomplicated: Secondary | ICD-10-CM

## 2022-02-02 DIAGNOSIS — E559 Vitamin D deficiency, unspecified: Secondary | ICD-10-CM

## 2022-02-02 LAB — POCT GLYCOSYLATED HEMOGLOBIN (HGB A1C): Hemoglobin A1C: 8.9 % — AB (ref 4.0–5.6)

## 2022-02-02 LAB — POCT UA - MICROALBUMIN
Creatinine, POC: 100 mg/dL
Microalbumin Ur, POC: 80 mg/L

## 2022-02-02 MED ORDER — GLIMEPIRIDE 4 MG PO TABS: 4.0000 mg | ORAL_TABLET | Freq: Two times a day (BID) | ORAL | 3 refills | Status: DC

## 2022-02-02 MED ORDER — FUROSEMIDE 40 MG PO TABS: 40.0000 mg | ORAL_TABLET | Freq: Every day | ORAL | 1 refills | Status: DC

## 2022-02-03 ENCOUNTER — Ambulatory Visit
Admission: RE | Admit: 2022-02-03 | Discharge: 2022-02-03 | Disposition: A | Discharge status: Home / Self Care | Payer: HMO | Source: Ambulatory Visit | Attending: Nurse Practitioner | Admitting: Nurse Practitioner

## 2022-02-03 DIAGNOSIS — E041 Nontoxic single thyroid nodule: Secondary | ICD-10-CM

## 2022-02-03 LAB — COMPREHENSIVE METABOLIC PANEL
ALT: 3 IU/L (ref 0–32)
AST: 9 IU/L (ref 0–40)
Albumin/Globulin Ratio: 1.6 (ref 1.2–2.2)
Albumin: 3.9 g/dL (ref 3.9–4.9)
Alkaline Phosphatase: 127 IU/L — ABNORMAL HIGH (ref 44–121)
BUN/Creatinine Ratio: 15 (ref 12–28)
BUN: 17 mg/dL (ref 8–27)
Bilirubin Total: 0.3 mg/dL (ref 0.0–1.2)
CO2: 23 mmol/L (ref 20–29)
Calcium: 9.3 mg/dL (ref 8.7–10.3)
Chloride: 98 mmol/L (ref 96–106)
Creatinine, Ser: 1.13 mg/dL — ABNORMAL HIGH (ref 0.57–1.00)
Globulin, Total: 2.5 g/dL (ref 1.5–4.5)
Glucose: 110 mg/dL — ABNORMAL HIGH (ref 70–99)
Potassium: 5.2 mmol/L (ref 3.5–5.2)
Sodium: 135 mmol/L (ref 134–144)
Total Protein: 6.4 g/dL (ref 6.0–8.5)
eGFR: 54 mL/min/{1.73_m2} — ABNORMAL LOW (ref >59)

## 2022-02-03 LAB — TSH+FREE T4
Free T4: 1.15 ng/dL (ref 0.82–1.77)
TSH: 3.63 u[IU]/mL (ref 0.450–4.500)

## 2022-02-03 LAB — CBC
Hematocrit: 33.9 % — ABNORMAL LOW (ref 34.0–46.6)
Hemoglobin: 10.7 g/dL — ABNORMAL LOW (ref 11.1–15.9)
MCH: 25.7 pg — ABNORMAL LOW (ref 26.6–33.0)
MCHC: 31.6 g/dL (ref 31.5–35.7)
MCV: 81 fL (ref 79–97)
Platelets: 310 10*3/uL (ref 150–450)
RBC: 4.17 x10E6/uL (ref 3.77–5.28)
RDW: 13.1 % (ref 11.7–15.4)
WBC: 8.1 10*3/uL (ref 3.4–10.8)

## 2022-02-03 LAB — LIPID PANEL
Chol/HDL Ratio: 4.2 ratio (ref 0.0–4.4)
Cholesterol, Total: 139 mg/dL (ref 100–199)
HDL: 33 mg/dL — ABNORMAL LOW (ref >39)
LDL Chol Calc (NIH): 80 mg/dL (ref 0–99)
Triglycerides: 145 mg/dL (ref 0–149)
VLDL Cholesterol Cal: 26 mg/dL (ref 5–40)

## 2022-02-03 LAB — VITAMIN D 25 HYDROXY (VIT D DEFICIENCY, FRACTURES): Vit D, 25-Hydroxy: 8.2 ng/mL — ABNORMAL LOW (ref 30.0–100.0)

## 2022-02-14 ENCOUNTER — Encounter: Payer: Self-pay | Admitting: Nurse Practitioner

## 2022-02-14 ENCOUNTER — Other Ambulatory Visit: Payer: Self-pay | Admitting: Nurse Practitioner

## 2022-02-14 DIAGNOSIS — E559 Vitamin D deficiency, unspecified: Secondary | ICD-10-CM

## 2022-02-14 MED ORDER — ERGOCALCIFEROL 1.25 MG (50000 UT) PO CAPS: 50000.0000 [IU] | ORAL_CAPSULE | ORAL | 5 refills | Status: DC

## 2022-02-16 ENCOUNTER — Other Ambulatory Visit: Payer: Self-pay

## 2022-02-16 DIAGNOSIS — J439 Emphysema, unspecified: Secondary | ICD-10-CM

## 2022-02-16 MED ORDER — ATROVENT HFA 17 MCG/ACT IN AERS: 2.0000 | INHALATION_SPRAY | Freq: Four times a day (QID) | RESPIRATORY_TRACT | 11 refills | Status: DC | PRN

## 2022-02-19 ENCOUNTER — Other Ambulatory Visit: Payer: Self-pay | Admitting: Nurse Practitioner

## 2022-02-19 DIAGNOSIS — G894 Chronic pain syndrome: Secondary | ICD-10-CM

## 2022-02-19 DIAGNOSIS — E119 Type 2 diabetes mellitus without complications: Secondary | ICD-10-CM

## 2022-02-19 DIAGNOSIS — I5041 Acute combined systolic (congestive) and diastolic (congestive) heart failure: Secondary | ICD-10-CM

## 2022-02-28 ENCOUNTER — Ambulatory Visit
Admission: RE | Admit: 2022-02-28 | Discharge: 2022-02-28 | Disposition: A | Discharge status: Home / Self Care | Payer: HMO | Source: Ambulatory Visit | Attending: Nurse Practitioner | Admitting: Nurse Practitioner

## 2022-02-28 DIAGNOSIS — F17200 Nicotine dependence, unspecified, uncomplicated: Secondary | ICD-10-CM

## 2022-02-28 DIAGNOSIS — Z122 Encounter for screening for malignant neoplasm of respiratory organs: Secondary | ICD-10-CM

## 2022-02-28 DIAGNOSIS — J439 Emphysema, unspecified: Secondary | ICD-10-CM

## 2022-03-03 ENCOUNTER — Telehealth: Payer: Self-pay

## 2022-03-03 NOTE — Telephone Encounter (Signed)
Pt is calling request a refill on: ipratropium (ATROVENT) nebulizer solution 0.5 mg     Pharmacy: [facility] [address] [place] [address]   LOV 02/02/22 ROV 04/04/22

## 2022-03-03 NOTE — Telephone Encounter (Signed)
Nothing is documented in the patient notes stating this Rx.

## 2022-03-07 ENCOUNTER — Other Ambulatory Visit: Payer: Self-pay | Admitting: Nurse Practitioner

## 2022-03-07 ENCOUNTER — Encounter: Payer: Self-pay | Admitting: Nurse Practitioner

## 2022-03-07 DIAGNOSIS — R062 Wheezing: Secondary | ICD-10-CM

## 2022-03-07 MED ORDER — IPRATROPIUM BROMIDE 0.02 % IN SOLN: 0.5000 mg | Freq: Four times a day (QID) | RESPIRATORY_TRACT | 12 refills | Status: DC | PRN

## 2022-03-07 NOTE — Telephone Encounter (Signed)
Can you find out from the patient, does she use atrovent inhaler or nebulizer or both depending on symptoms? We have inhaler documented only.  Thanks  -HB

## 2022-03-07 NOTE — Telephone Encounter (Signed)
Please let the patient know that I have added the atrovent nebulizer. Sent new prescription to [facility] on [address].  Thanks so much.   -HB

## 2022-04-03 NOTE — Progress Notes (Unsigned)
Established patient visit   Patient: Kaitlin Cantrell   DOB: Jul 20, 1955   66 y.o. Female  MRN: [id number] Visit Date: 04/04/2022   No chief complaint on file.  Subjective    HPI  -hypertension  --resistant -type 2 diabetes  --most recent HgbA1c 8.9 on 02/02/2022  --added back glimepiride.  --recheck HgbA1c --need for diabetic eye exam  -pap smear    Medications: Outpatient Medications Prior to Visit  Medication Sig   ergocalciferol (DRISDOL) 1.25 MG (50000 UT) capsule Take 1 capsule (50,000 Units total) by mouth once a week.   atorvastatin (LIPITOR) 80 MG tablet TAKE 1 TABLET BY MOUTH ONCE DAILY AT  6PM   blood glucose meter kit and supplies KIT Dispense based on patient and insurance preference. Use up to four times daily as directed.   budesonide-formoterol (SYMBICORT) 160-4.5 MCG/ACT inhaler Inhale 1 puff into the lungs 2 (two) times daily. (Patient taking differently: Inhale 1 puff into the lungs 2 (two) times daily as needed (wheezing, shortness of breath).)   cyclobenzaprine (FLEXERIL) 10 MG tablet Take 1 tablet by mouth three times daily as needed for muscle spasm   ENTRESTO 49-51 MG Take 1 tablet by mouth twice daily   furosemide (LASIX) 40 MG tablet Take 1 tablet (40 mg total) by mouth daily.   gabapentin (NEURONTIN) 400 MG capsule TAKE 1 CAPSULE BY MOUTH THREE TIMES DAILY   glimepiride (AMARYL) 4 MG tablet Take 1 tablet (4 mg total) by mouth 2 (two) times daily.   guaiFENesin-dextromethorphan (ROBITUSSIN DM) 100-10 MG/5ML syrup Take 5 mLs by mouth every 4 (four) hours as needed for cough.   insulin glargine (LANTUS SOLOSTAR) 100 UNIT/ML Solostar Pen Inject 25 Units into the skin daily.   Insulin Pen Needle (PEN NEEDLES) 32G X 4 MM MISC 1 Units by Does not apply route daily.   ipratropium (ATROVENT HFA) 17 MCG/ACT inhaler Inhale 2 puffs into the lungs every 6 (six) hours as needed for wheezing.   ipratropium (ATROVENT) 0.02 % nebulizer solution Take 2.5 mLs (0.5 mg total)  by nebulization every 6 (six) hours as needed for wheezing or shortness of breath.   Krill Oil CAPS Take 1 capsule by mouth daily.    metFORMIN (GLUCOPHAGE) 850 MG tablet TAKE 1 TABLET BY MOUTH TWICE DAILY WITH A MEAL   metoprolol (TOPROL-XL) 200 MG 24 hr tablet Take 1 tablet (200 mg total) by mouth daily.   rivaroxaban (XARELTO) 20 MG TABS tablet Take 1 tablet (20 mg total) by mouth daily.   No facility-administered medications prior to visit.    Review of Systems  {Labs (Optional):23779}   Objective    There were no vitals filed for this visit. There is no height or weight on file to calculate BMI.  BP Readings from Last 3 Encounters:  02/02/22 124/73  12/02/21 (Abnormal) 176/96  01/16/20 136/80    Wt Readings from Last 3 Encounters:  02/02/22 258 lb 1.9 oz (117.1 kg)  12/02/21 260 lb (117.9 kg)  01/16/20 251 lb (113.9 kg)    Physical Exam  ***  No results found for any visits on 04/04/22.  Assessment & Plan     Problem List Items Addressed This Visit   None    No follow-ups on file.         Ronnell Freshwater, NP  [facility] at [facility] [phone] (phone) [phone] (fax)  [place]

## 2022-04-04 ENCOUNTER — Encounter: Payer: Self-pay | Admitting: Nurse Practitioner

## 2022-04-04 ENCOUNTER — Ambulatory Visit (INDEPENDENT_AMBULATORY_CARE_PROVIDER_SITE_OTHER): Payer: HMO | Admitting: Nurse Practitioner

## 2022-04-04 VITALS — BP 115/74 | HR 75 | Ht 66.0 in | Wt 254.8 lb

## 2022-04-04 DIAGNOSIS — R809 Proteinuria, unspecified: Secondary | ICD-10-CM

## 2022-04-04 DIAGNOSIS — N183 Chronic kidney disease, stage 3 unspecified: Secondary | ICD-10-CM

## 2022-04-04 DIAGNOSIS — E785 Hyperlipidemia, unspecified: Secondary | ICD-10-CM

## 2022-04-04 DIAGNOSIS — E1129 Type 2 diabetes mellitus with other diabetic kidney complication: Secondary | ICD-10-CM | POA: Diagnosis not present

## 2022-04-04 DIAGNOSIS — I2699 Other pulmonary embolism without acute cor pulmonale: Secondary | ICD-10-CM

## 2022-04-04 DIAGNOSIS — I5041 Acute combined systolic (congestive) and diastolic (congestive) heart failure: Secondary | ICD-10-CM

## 2022-04-04 DIAGNOSIS — E119 Type 2 diabetes mellitus without complications: Secondary | ICD-10-CM

## 2022-04-04 DIAGNOSIS — G894 Chronic pain syndrome: Secondary | ICD-10-CM

## 2022-04-04 DIAGNOSIS — E1169 Type 2 diabetes mellitus with other specified complication: Secondary | ICD-10-CM | POA: Diagnosis not present

## 2022-04-04 DIAGNOSIS — E1159 Type 2 diabetes mellitus with other circulatory complications: Secondary | ICD-10-CM | POA: Diagnosis not present

## 2022-04-04 DIAGNOSIS — J439 Emphysema, unspecified: Secondary | ICD-10-CM

## 2022-04-04 DIAGNOSIS — I152 Hypertension secondary to endocrine disorders: Secondary | ICD-10-CM

## 2022-04-04 DIAGNOSIS — Z794 Long term (current) use of insulin: Secondary | ICD-10-CM | POA: Diagnosis not present

## 2022-04-04 LAB — POCT GLYCOSYLATED HEMOGLOBIN (HGB A1C): HbA1c POC (<> result, manual entry): 7.2 % (ref 4.0–5.6)

## 2022-04-04 MED ORDER — BUDESONIDE-FORMOTEROL FUMARATE 160-4.5 MCG/ACT IN AERO: 1.0000 | INHALATION_SPRAY | Freq: Two times a day (BID) | RESPIRATORY_TRACT | 4 refills | Status: DC

## 2022-04-04 MED ORDER — GABAPENTIN 400 MG PO CAPS: 400.0000 mg | ORAL_CAPSULE | Freq: Three times a day (TID) | ORAL | 1 refills | Status: DC

## 2022-04-04 MED ORDER — METFORMIN HCL 850 MG PO TABS: 850.0000 mg | ORAL_TABLET | Freq: Two times a day (BID) | ORAL | 3 refills | Status: DC

## 2022-04-04 MED ORDER — GLIMEPIRIDE 4 MG PO TABS: 4.0000 mg | ORAL_TABLET | Freq: Two times a day (BID) | ORAL | 3 refills | Status: DC

## 2022-04-04 MED ORDER — CYCLOBENZAPRINE HCL 10 MG PO TABS: 10.0000 mg | ORAL_TABLET | Freq: Three times a day (TID) | ORAL | 1 refills | Status: DC | PRN

## 2022-04-04 MED ORDER — FUROSEMIDE 40 MG PO TABS: 40.0000 mg | ORAL_TABLET | Freq: Every day | ORAL | 1 refills | Status: DC

## 2022-04-04 MED ORDER — LANTUS SOLOSTAR 100 UNIT/ML ~~LOC~~ SOPN: 25.0000 [IU] | PEN_INJECTOR | Freq: Every day | SUBCUTANEOUS | 3 refills | Status: DC

## 2022-04-04 MED ORDER — ATORVASTATIN CALCIUM 80 MG PO TABS: ORAL_TABLET | ORAL | 3 refills | Status: DC

## 2022-04-04 MED ORDER — METOPROLOL SUCCINATE ER 200 MG PO TB24: 200.0000 mg | ORAL_TABLET | Freq: Every day | ORAL | 3 refills | Status: DC

## 2022-04-04 MED ORDER — RIVAROXABAN 20 MG PO TABS: 20.0000 mg | ORAL_TABLET | Freq: Every day | ORAL | 3 refills | Status: DC

## 2022-04-04 MED ORDER — ENTRESTO 49-51 MG PO TABS: 1.0000 | ORAL_TABLET | Freq: Two times a day (BID) | ORAL | 3 refills | Status: DC

## 2022-05-05 ENCOUNTER — Telehealth (HOSPITAL_COMMUNITY): Payer: Self-pay | Admitting: Surgery

## 2022-05-05 NOTE — Telephone Encounter (Signed)
I called patient after receiving re-referral to the [facility].  I scheduled patient an appointment with Dr. Aundra Dubin on Jan 23rd.

## 2022-05-24 ENCOUNTER — Encounter (HOSPITAL_COMMUNITY): Payer: Self-pay | Admitting: Cardiology

## 2022-05-24 ENCOUNTER — Other Ambulatory Visit: Payer: Self-pay

## 2022-05-24 ENCOUNTER — Ambulatory Visit (HOSPITAL_COMMUNITY)
Admission: RE | Admit: 2022-05-24 | Discharge: 2022-05-24 | Disposition: A | Discharge status: Home / Self Care | Payer: HMO | Source: Ambulatory Visit | Attending: Cardiology | Admitting: Cardiology

## 2022-05-24 VITALS — BP 130/78 | HR 68 | Wt 255.8 lb

## 2022-05-24 DIAGNOSIS — J439 Emphysema, unspecified: Secondary | ICD-10-CM

## 2022-05-24 DIAGNOSIS — I447 Left bundle-branch block, unspecified: Secondary | ICD-10-CM | POA: Diagnosis not present

## 2022-05-24 DIAGNOSIS — G894 Chronic pain syndrome: Secondary | ICD-10-CM | POA: Insufficient documentation

## 2022-05-24 DIAGNOSIS — Z86711 Personal history of pulmonary embolism: Secondary | ICD-10-CM | POA: Insufficient documentation

## 2022-05-24 DIAGNOSIS — I4892 Unspecified atrial flutter: Secondary | ICD-10-CM | POA: Diagnosis not present

## 2022-05-24 DIAGNOSIS — I428 Other cardiomyopathies: Secondary | ICD-10-CM | POA: Insufficient documentation

## 2022-05-24 DIAGNOSIS — F17210 Nicotine dependence, cigarettes, uncomplicated: Secondary | ICD-10-CM | POA: Diagnosis not present

## 2022-05-24 DIAGNOSIS — Z7901 Long term (current) use of anticoagulants: Secondary | ICD-10-CM | POA: Insufficient documentation

## 2022-05-24 DIAGNOSIS — I5042 Chronic combined systolic (congestive) and diastolic (congestive) heart failure: Secondary | ICD-10-CM | POA: Diagnosis not present

## 2022-05-24 DIAGNOSIS — Z794 Long term (current) use of insulin: Secondary | ICD-10-CM | POA: Diagnosis not present

## 2022-05-24 DIAGNOSIS — I130 Hypertensive heart and chronic kidney disease with heart failure and stage 1 through stage 4 chronic kidney disease, or unspecified chronic kidney disease: Secondary | ICD-10-CM | POA: Diagnosis not present

## 2022-05-24 DIAGNOSIS — J449 Chronic obstructive pulmonary disease, unspecified: Secondary | ICD-10-CM | POA: Insufficient documentation

## 2022-05-24 DIAGNOSIS — E1122 Type 2 diabetes mellitus with diabetic chronic kidney disease: Secondary | ICD-10-CM | POA: Diagnosis not present

## 2022-05-24 DIAGNOSIS — I2699 Other pulmonary embolism without acute cor pulmonale: Secondary | ICD-10-CM | POA: Insufficient documentation

## 2022-05-24 DIAGNOSIS — I5041 Acute combined systolic (congestive) and diastolic (congestive) heart failure: Secondary | ICD-10-CM | POA: Diagnosis not present

## 2022-05-24 DIAGNOSIS — Z7951 Long term (current) use of inhaled steroids: Secondary | ICD-10-CM | POA: Insufficient documentation

## 2022-05-24 DIAGNOSIS — E875 Hyperkalemia: Secondary | ICD-10-CM | POA: Insufficient documentation

## 2022-05-24 DIAGNOSIS — Z7984 Long term (current) use of oral hypoglycemic drugs: Secondary | ICD-10-CM | POA: Diagnosis not present

## 2022-05-24 DIAGNOSIS — Z79899 Other long term (current) drug therapy: Secondary | ICD-10-CM | POA: Insufficient documentation

## 2022-05-24 DIAGNOSIS — N183 Chronic kidney disease, stage 3 unspecified: Secondary | ICD-10-CM | POA: Diagnosis not present

## 2022-05-24 LAB — BASIC METABOLIC PANEL
Anion gap: 10 (ref 5–15)
BUN: 20 mg/dL (ref 8–23)
CO2: 25 mmol/L (ref 22–32)
Calcium: 9.2 mg/dL (ref 8.9–10.3)
Chloride: 98 mmol/L (ref 98–111)
Creatinine, Ser: 1.45 mg/dL — ABNORMAL HIGH (ref 0.44–1.00)
GFR, Estimated: 40 mL/min — ABNORMAL LOW (ref >60)
Glucose, Bld: 152 mg/dL — ABNORMAL HIGH (ref 70–99)
Potassium: 4.9 mmol/L (ref 3.5–5.1)
Sodium: 133 mmol/L — ABNORMAL LOW (ref 135–145)

## 2022-05-24 LAB — CBC
HCT: 35.5 % — ABNORMAL LOW (ref 36.0–46.0)
Hemoglobin: 10.8 g/dL — ABNORMAL LOW (ref 12.0–15.0)
MCH: 25.8 pg — ABNORMAL LOW (ref 26.0–34.0)
MCHC: 30.4 g/dL (ref 30.0–36.0)
MCV: 84.7 fL (ref 80.0–100.0)
Platelets: 333 10*3/uL (ref 150–400)
RBC: 4.19 MIL/uL (ref 3.87–5.11)
RDW: 14.5 % (ref 11.5–15.5)
WBC: 9.3 10*3/uL (ref 4.0–10.5)
nRBC: 0 % (ref 0.0–0.2)

## 2022-05-24 MED ORDER — BUPROPION HCL ER (SR) 150 MG PO TB12: ORAL_TABLET | ORAL | 3 refills | Status: DC

## 2022-05-24 MED ORDER — BUDESONIDE-FORMOTEROL FUMARATE 160-4.5 MCG/ACT IN AERO: 1.0000 | INHALATION_SPRAY | Freq: Two times a day (BID) | RESPIRATORY_TRACT | 4 refills | Status: DC

## 2022-05-24 NOTE — Patient Instructions (Signed)
START Wellbutrin 150mg  daily for 3 days then 150mg  Twice daily  Labs done today, your results will be available in MyChart, we will contact you for abnormal readings.  Your physician has requested that you have an echocardiogram. Echocardiography is a painless test that uses sound waves to create images of your heart. It provides your doctor with information about the size and shape of your heart and how well your heart's chambers and valves are working. This procedure takes approximately one hour. There are no restrictions for this procedure. Please do NOT wear cologne, perfume, aftershave, or lotions (deodorant is allowed). Please arrive 15 minutes prior to your appointment time.   Your physician recommends that you schedule a follow-up appointment in: 1 year ( January 2025) ** please call the office in November to arrange your follow up appointment **  If you have any questions or concerns before your next appointment please send [place] a message through [place] or call our office at [phone].    TO LEAVE A MESSAGE FOR THE NURSE SELECT OPTION 2, PLEASE LEAVE A MESSAGE INCLUDING: YOUR NAME DATE OF BIRTH CALL BACK NUMBER REASON FOR CALL**this is important as we prioritize the call backs  YOU WILL RECEIVE A CALL BACK THE SAME DAY AS LONG AS YOU CALL BEFORE 4:00 PM  At the [facility], you and your health needs are our priority. As part of our continuing mission to provide you with exceptional heart care, we have created designated Provider Care Teams. These Care Teams include your primary Cardiologist (physician) and Advanced Practice Providers (APPs- Physician Assistants and Nurse Practitioners) who all work together to provide you with the care you need, when you need it.   You may see any of the following providers on your designated Care Team at your next follow up: Dr Glori Bickers Dr Loralie Champagne Dr. Roxana Hires, NP Lyda Jester, [place] [facility] [place], [place] Forestine Na, NP Audry Riles, PharmD   Please be sure to bring in all your medications bottles to every appointment.

## 2022-05-24 NOTE — Progress Notes (Signed)
Date:  05/24/2022   ID:  Kaitlin Cantrell, DOB [birth year], MRN [id number]   Provider location: [place] [facility] Type of Visit: Established patient   PCP:  Kaitlin Freshwater, NP  Cardiologist:  Dr. Aundra Dubin  Chief Complaint: F/u post TEE/DCCV    History of Present Illness: Kaitlin Cantrell is a 67 y.o. female who has a history of COPD, tobacco abuse, DM, HTN, arthritis, and chronic pain syndrome and presented to [facility] with SOB in 8/17.  She had had several months of increasing exertional dyspnea prior to this.  CT angio 12/29/15 showed submassive PE with concerns for right heart strain. Now on Xarelto.  Echo 12/30/15 showed LVEF 20-25%, Grade 3 DD, Mod MR, mod LAE, Mild RV dilation with mildly reduced systolic function, Moderate RAE, Moderate TR. Peak PA pressure 54 mm Hg.  Cardiac MRI (9/17) showed EF 15%, moderate LV dilation, mild RV dilation/moderately decreased RV systolic function, no LGE.  She was diuresed in the hospital and begun on Xarelto.   Smokes 1 ppd for many years.  She tried Chantix but did not stop. No ETOH or drug use.  No history of cancer or recent surgery, no long car/plane trips prior to her PE.    In 9/17, spironolactone was discontinued due to persistent hyperkalemia.   RHC/LHC in 12/17 showed no significant CAD, optimized filling pressures, low but not markedly low cardiac output.    Echo in 3/18 showed  EF improved to 55% with mild LVH.  Repeat echo 7/20, EF 55-60%.     She was admitted 8/21 for COVID PNA. Hospital course c/b atrial flutter w/ RVR. She did not require intubation. She was seen by general cardiology w/ recommendations to treat w/ rate control w/ metoprolol + continuation of Xarelto. Recommend eventual DCCV once infection cleared.   She had successful TEE/DCCV to NSR in 9/21. TEE showed normal EF 55%.    She returns for followup of CHF.  No chest pain.  She is short of breath walking in [place] and walking into the office. She  wheezes frequently. No problems walking around the house.  No orthopnea/PND.  No palpitations.  No BRBPR/melena. Weight up 4 lbs. She is still smoking > 1 ppd.   ECG (personally reviewed): NSR, LBBB 122 msec  Labs (10/23): LDL 80, HDL 33, K 5.2, creatinine 1.13   PMH: 1. PE: 12/2015 CT angio 12/29/15 submassive PE with concerns for right heart strain. Now on Xarelto.  2. Cardiomyopathy:  Found 8/17.  - CMRI 01/01/2016: Nonischemic cardiomyopathy.  EF 15%, moderate LV dilation, mild RV dilation/moderately decreased RV systolic function.  No LGE: no infiltrative or inflammatory process noted, no evidence for prior MI. - ECHO  12/30/2015: EF 20-25%. Grade III DD, Mod TR, PASP 54, mildly decreased RV systolic function.  - Hyperkalemia with spironolactone.  - LHC/RHC (12/17): No CAD, mean RA 2, PA 39/13 mean 23, mean PCWP 7, CI 2.1.  - Echo (3/18): EF 55%, mild LVH.  - Echo (7/20): EF 55-60%, normal RV.  - TEE (9/21): EF 55%, RV normal, mild MR.  3. ABIs 01/05/2016 normal   4. COPD: Active smoker.  5. Type II diabetes 6. HTN 7. Chronic pain syndrome 8. CKD stage 3: Suspect diabetic nephropathy.  9. Atrial Flutter: DCCV to NSR in 9/21.    Current Outpatient Medications  Medication Sig Dispense Refill   atorvastatin (LIPITOR) 80 MG tablet TAKE 1 TABLET BY MOUTH ONCE DAILY AT  6PM 90 tablet  3   blood glucose meter kit and supplies KIT Dispense based on patient and insurance preference. Use up to four times daily as directed. 1 each 0   budesonide (PULMICORT) 0.25 MG/2ML nebulizer solution Take 0.5 mg by nebulization daily.     budesonide-formoterol (SYMBICORT) 160-4.5 MCG/ACT inhaler Inhale 1 puff into the lungs 2 (two) times daily. 3 each 4   buPROPion (WELLBUTRIN SR) 150 MG 12 hr tablet 150mg  daily for 3 days, then 150 mg Twice daily 200 tablet 3   cyclobenzaprine (FLEXERIL) 10 MG tablet Take 1 tablet (10 mg total) by mouth 3 (three) times daily as needed. for muscle spams 270 tablet 1    ergocalciferol (DRISDOL) 1.25 MG (50000 UT) capsule Take 1 capsule (50,000 Units total) by mouth once a week. 4 capsule 5   furosemide (LASIX) 40 MG tablet Take 1 tablet (40 mg total) by mouth daily. 90 tablet 1   gabapentin (NEURONTIN) 400 MG capsule Take 1 capsule (400 mg total) by mouth 3 (three) times daily. 270 capsule 1   glimepiride (AMARYL) 4 MG tablet Take 1 tablet (4 mg total) by mouth 2 (two) times daily. 180 tablet 3   insulin glargine (LANTUS SOLOSTAR) 100 UNIT/ML Solostar Pen Inject 25 Units into the skin daily. 15 mL 3   Insulin Pen Needle (PEN NEEDLES) 32G X 4 MM MISC 1 Units by Does not apply route daily. 30 each 1   ipratropium (ATROVENT HFA) 17 MCG/ACT inhaler Inhale 2 puffs into the lungs every 6 (six) hours as needed for wheezing. 1 each 11   ipratropium (ATROVENT) 0.02 % nebulizer solution Take 2.5 mLs (0.5 mg total) by nebulization every 6 (six) hours as needed for wheezing or shortness of breath. 300 mL 12   Krill Oil CAPS Take 1 capsule by mouth daily.      metFORMIN (GLUCOPHAGE) 850 MG tablet Take 1 tablet (850 mg total) by mouth 2 (two) times daily with a meal. 180 tablet 3   metoprolol (TOPROL-XL) 200 MG 24 hr tablet Take 1 tablet (200 mg total) by mouth daily. 90 tablet 3   rivaroxaban (XARELTO) 20 MG TABS tablet Take 1 tablet (20 mg total) by mouth daily. 90 tablet 3   sacubitril-valsartan (ENTRESTO) 49-51 MG Take 1 tablet by mouth 2 (two) times daily. 180 tablet 3   No current facility-administered medications for this encounter.    Allergies:   Albuterol and Morphine and related   Social History:  The patient  reports that she has been smoking cigarettes. She has a 45.00 pack-year smoking history. She has never used smokeless tobacco. She reports current alcohol use. She reports that she does not use drugs.   Family History:  The patient's family history includes COPD in her paternal grandfather; Cancer in her maternal grandmother; Colon polyps in her father;  Diabetes in her brother, father, mother, and paternal grandmother; Heart disease in her father, paternal grandfather, and paternal grandmother; Heart murmur in her mother; Hypertension in her brother, brother, and mother.   ROS:  Please see the history of present illness.   All other systems are personally reviewed and negative.   Vitals:   BP 130/78   Pulse 68   Wt 116 kg (255 lb 12.8 oz)   SpO2 97%   BMI 41.29 kg/m  General: NAD Neck: No JVD, no thyromegaly or thyroid nodule.  Lungs: Distant BS CV: Nondisplaced PMI.  Heart regular S1/S2, no S3/S4, no murmur.  No peripheral edema.  No carotid bruit.  Normal pedal pulses.  Abdomen: Soft, nontender, no hepatosplenomegaly, no distention.  Skin: Intact without lesions or rashes.  Neurologic: Alert and oriented x 3.  Psych: Normal affect. Extremities: No clubbing or cyanosis. Left lower leg larger than right lower leg chronically, varicosities left lower leg.  HEENT: Normal.   Recent Labs: 02/02/2022: ALT 3; TSH 3.630 05/24/2022: BUN 20; Creatinine, Ser 1.45; Hemoglobin 10.8; Platelets 333; Potassium 4.9; Sodium 133  Personally reviewed   Wt Readings from Last 3 Encounters:  05/24/22 116 kg (255 lb 12.8 oz)  04/04/22 115.6 kg (254 lb 12.8 oz)  02/02/22 117.1 kg (258 lb 1.9 oz)      ASSESSMENT AND PLAN:  1. H/O PE: Submassive on CT.  Etiology uncertain: no known cancer, long trip, recent surgery.  Possibly related to stasis in the setting of cardiomyopathy with severely decreased systolic function.  - Given cardiomyopathy and submassive PE with no certain trigger, she has been continued on long-term anticoagulation. Remains on Xarelto. CBC today.  2. Chronic systolic => diastolic CHF: Nonischemic cardiomyopathy.  ECHO 12/2015 EF 15-20% with diffuse hypokinesis.  Uncertain etiology.  No chest pain. Symptoms began as orthopnea around 6/17, then developed progressive exertional dyspnea.  ECG shows anteroseptal MI but no change from 2013  ECG.  Cardiac MRI did not show LGE. HIV negative, thyroid indices ok (mild TSH elevation but normal T3/T4), SPEP negative.  12/17 RHC/LHC showed no angiographic CAD, optimized filling pressures, low but not markedly low cardiac output.  Echo 3/18 showed recovery of LV function with EF up to 55%. Echo repeated 7/20 and EF still 55-60%. TEE in 9/21 showed EF 55%. NYHA class III but suspect symptoms are most likely due to COPD.  She is not volume overloaded on exam. Of note, she has a new LBBB today.   - I will arrange repeat echo given new LBBB. - Continue Toprol XL 200 mg daily  - Continue Entresto 49/51 bid.   - Off spironolactone with persistent hyperkalemia.   - Continue Lasix 40 mg daily. BMET today.  2. Atrial Flutter: DCCV in 9/21.  She is in NSR today.  - Continue Xarelto, CBC today.  - Continue metoprolol  3. CKD: Stage III. BMET today.  4. Active smoker: She failed Chantix.   - She is willing to try Wellbutrin, will prescribe today.   F/u in 1 year if echo shows that EF remains normal.   Signed, Loralie Champagne, MD  05/24/2022  [place] [address] [facility] and [place] [address] [phone] (office) [phone] (fax)

## 2022-05-27 ENCOUNTER — Encounter (HOSPITAL_COMMUNITY): Payer: Self-pay

## 2022-05-31 ENCOUNTER — Other Ambulatory Visit: Payer: Self-pay

## 2022-05-31 DIAGNOSIS — R062 Wheezing: Secondary | ICD-10-CM

## 2022-05-31 DIAGNOSIS — J439 Emphysema, unspecified: Secondary | ICD-10-CM

## 2022-05-31 MED ORDER — BUDESONIDE 0.25 MG/2ML IN SUSP: 0.5000 mg | Freq: Every day | RESPIRATORY_TRACT | 1 refills | Status: DC

## 2022-06-06 ENCOUNTER — Ambulatory Visit (HOSPITAL_COMMUNITY)
Admission: RE | Admit: 2022-06-06 | Discharge: 2022-06-06 | Disposition: A | Discharge status: Home / Self Care | Payer: PPO | Source: Ambulatory Visit | Attending: Nurse Practitioner | Admitting: Nurse Practitioner

## 2022-06-06 ENCOUNTER — Telehealth (HOSPITAL_COMMUNITY): Payer: Self-pay

## 2022-06-06 DIAGNOSIS — E785 Hyperlipidemia, unspecified: Secondary | ICD-10-CM | POA: Insufficient documentation

## 2022-06-06 DIAGNOSIS — E119 Type 2 diabetes mellitus without complications: Secondary | ICD-10-CM | POA: Diagnosis not present

## 2022-06-06 DIAGNOSIS — I5041 Acute combined systolic (congestive) and diastolic (congestive) heart failure: Secondary | ICD-10-CM | POA: Insufficient documentation

## 2022-06-06 DIAGNOSIS — K219 Gastro-esophageal reflux disease without esophagitis: Secondary | ICD-10-CM | POA: Insufficient documentation

## 2022-06-06 DIAGNOSIS — I083 Combined rheumatic disorders of mitral, aortic and tricuspid valves: Secondary | ICD-10-CM | POA: Insufficient documentation

## 2022-06-06 DIAGNOSIS — I110 Hypertensive heart disease with heart failure: Secondary | ICD-10-CM | POA: Insufficient documentation

## 2022-06-06 LAB — ECHOCARDIOGRAM COMPLETE
AR max vel: 1.62 cm2
AV Area VTI: 1.71 cm2
AV Area mean vel: 1.58 cm2
AV Mean grad: 8 mmHg
AV Peak grad: 14.4 mmHg
Ao pk vel: 1.9 m/s
Area-P 1/2: 3.53 cm2
Calc EF: 50.7 %
S' Lateral: 3.2 cm
Single Plane A2C EF: 51.9 %
Single Plane A4C EF: 54.4 %

## 2022-06-06 NOTE — Telephone Encounter (Signed)
Patient states that her feet have been swollen since Thursday. She has tried elevating them but it has not helped. She  wants to know if she can take extra lasix. Please advise

## 2022-06-06 NOTE — Progress Notes (Signed)
Echocardiogram 2D Echocardiogram has been performed.  Fidel Levy 06/06/2022, 3:55 PM

## 2022-06-07 NOTE — Telephone Encounter (Signed)
Can increase Lasix to bid for 3 days then back to once daily.

## 2022-06-09 NOTE — Telephone Encounter (Signed)
Patient advised and verbalized understanding 

## 2022-06-09 NOTE — Telephone Encounter (Signed)
No answer, Left message to return call.  

## 2022-06-14 ENCOUNTER — Ambulatory Visit (HOSPITAL_COMMUNITY)
Admission: RE | Admit: 2022-06-14 | Discharge: 2022-06-14 | Disposition: A | Discharge status: Home / Self Care | Payer: PPO | Source: Ambulatory Visit | Attending: Cardiology | Admitting: Cardiology

## 2022-06-14 DIAGNOSIS — I5041 Acute combined systolic (congestive) and diastolic (congestive) heart failure: Secondary | ICD-10-CM | POA: Insufficient documentation

## 2022-06-14 LAB — BASIC METABOLIC PANEL
Anion gap: 10 (ref 5–15)
BUN: 17 mg/dL (ref 8–23)
CO2: 28 mmol/L (ref 22–32)
Calcium: 8.6 mg/dL — ABNORMAL LOW (ref 8.9–10.3)
Chloride: 101 mmol/L (ref 98–111)
Creatinine, Ser: 1.3 mg/dL — ABNORMAL HIGH (ref 0.44–1.00)
GFR, Estimated: 46 mL/min — ABNORMAL LOW (ref >60)
Glucose, Bld: 85 mg/dL (ref 70–99)
Potassium: 4.6 mmol/L (ref 3.5–5.1)
Sodium: 139 mmol/L (ref 135–145)

## 2022-07-22 ENCOUNTER — Other Ambulatory Visit: Payer: Self-pay | Admitting: Nurse Practitioner

## 2022-07-22 DIAGNOSIS — G894 Chronic pain syndrome: Secondary | ICD-10-CM

## 2022-08-04 ENCOUNTER — Encounter: Payer: Self-pay | Admitting: Nurse Practitioner

## 2022-08-04 ENCOUNTER — Ambulatory Visit (INDEPENDENT_AMBULATORY_CARE_PROVIDER_SITE_OTHER): Payer: PPO | Admitting: Nurse Practitioner

## 2022-08-04 VITALS — BP 144/85 | HR 58 | Ht 66.0 in | Wt 265.8 lb

## 2022-08-04 DIAGNOSIS — I5041 Acute combined systolic (congestive) and diastolic (congestive) heart failure: Secondary | ICD-10-CM

## 2022-08-04 DIAGNOSIS — E1169 Type 2 diabetes mellitus with other specified complication: Secondary | ICD-10-CM | POA: Diagnosis not present

## 2022-08-04 DIAGNOSIS — E785 Hyperlipidemia, unspecified: Secondary | ICD-10-CM

## 2022-08-04 DIAGNOSIS — G894 Chronic pain syndrome: Secondary | ICD-10-CM | POA: Diagnosis not present

## 2022-08-04 DIAGNOSIS — J439 Emphysema, unspecified: Secondary | ICD-10-CM | POA: Diagnosis not present

## 2022-08-04 DIAGNOSIS — Z794 Long term (current) use of insulin: Secondary | ICD-10-CM | POA: Diagnosis not present

## 2022-08-04 DIAGNOSIS — E559 Vitamin D deficiency, unspecified: Secondary | ICD-10-CM | POA: Diagnosis not present

## 2022-08-04 DIAGNOSIS — E1129 Type 2 diabetes mellitus with other diabetic kidney complication: Secondary | ICD-10-CM

## 2022-08-04 DIAGNOSIS — N183 Chronic kidney disease, stage 3 unspecified: Secondary | ICD-10-CM

## 2022-08-04 DIAGNOSIS — E1159 Type 2 diabetes mellitus with other circulatory complications: Secondary | ICD-10-CM

## 2022-08-04 DIAGNOSIS — I152 Hypertension secondary to endocrine disorders: Secondary | ICD-10-CM

## 2022-08-04 DIAGNOSIS — E1142 Type 2 diabetes mellitus with diabetic polyneuropathy: Secondary | ICD-10-CM

## 2022-08-04 DIAGNOSIS — E119 Type 2 diabetes mellitus without complications: Secondary | ICD-10-CM

## 2022-08-04 DIAGNOSIS — I2699 Other pulmonary embolism without acute cor pulmonale: Secondary | ICD-10-CM | POA: Diagnosis not present

## 2022-08-04 DIAGNOSIS — R062 Wheezing: Secondary | ICD-10-CM

## 2022-08-04 LAB — POCT GLYCOSYLATED HEMOGLOBIN (HGB A1C): HbA1c POC (<> result, manual entry): 7.5 % (ref 4.0–5.6)

## 2022-08-04 MED ORDER — ERGOCALCIFEROL 1.25 MG (50000 UT) PO CAPS: 50000.0000 [IU] | ORAL_CAPSULE | ORAL | 3 refills | Status: DC

## 2022-08-04 MED ORDER — METFORMIN HCL 850 MG PO TABS: 850.0000 mg | ORAL_TABLET | Freq: Two times a day (BID) | ORAL | 3 refills | Status: DC

## 2022-08-04 MED ORDER — ENTRESTO 49-51 MG PO TABS: 1.0000 | ORAL_TABLET | Freq: Two times a day (BID) | ORAL | 3 refills | Status: DC

## 2022-08-04 MED ORDER — FUROSEMIDE 40 MG PO TABS: 40.0000 mg | ORAL_TABLET | Freq: Every day | ORAL | 1 refills | Status: DC

## 2022-08-04 MED ORDER — RIVAROXABAN 20 MG PO TABS: 20.0000 mg | ORAL_TABLET | Freq: Every day | ORAL | 3 refills | Status: DC

## 2022-08-04 MED ORDER — ATROVENT HFA 17 MCG/ACT IN AERS: 2.0000 | INHALATION_SPRAY | Freq: Four times a day (QID) | RESPIRATORY_TRACT | 11 refills | Status: DC | PRN

## 2022-08-04 MED ORDER — GABAPENTIN 400 MG PO CAPS: 400.0000 mg | ORAL_CAPSULE | Freq: Three times a day (TID) | ORAL | 1 refills | Status: DC

## 2022-08-04 MED ORDER — METOPROLOL SUCCINATE ER 200 MG PO TB24: 200.0000 mg | ORAL_TABLET | Freq: Every day | ORAL | 3 refills | Status: DC

## 2022-08-04 MED ORDER — BUDESONIDE 0.25 MG/2ML IN SUSP: 0.5000 mg | Freq: Every day | RESPIRATORY_TRACT | 3 refills | Status: DC

## 2022-08-04 MED ORDER — LANTUS SOLOSTAR 100 UNIT/ML ~~LOC~~ SOPN: 25.0000 [IU] | PEN_INJECTOR | Freq: Every day | SUBCUTANEOUS | 3 refills | Status: DC

## 2022-08-04 MED ORDER — CYCLOBENZAPRINE HCL 10 MG PO TABS: 10.0000 mg | ORAL_TABLET | Freq: Three times a day (TID) | ORAL | 1 refills | Status: DC | PRN

## 2022-08-04 NOTE — Progress Notes (Signed)
Established patient visit   Patient: Kaitlin Cantrell   DOB: 07-14-1955   67 y.o. Female  MRN: [id number] Visit Date: 08/04/2022   Chief Complaint  Patient presents with   Medical Management of Chronic Issues   Subjective    HPI  Follow up  -DM2 -HgbA1c 7.5 today  -chronic kidney disease  -h/o PE causing heart failure --seeing cardiology since then  -states that cardiology recently reduced her furosemide to 20 mg daily  -legs swelling -10 pound weight gain  -shortness of breath . -has gone back to 40 mg furosemide since then on her own.  -symptoms are persistent.  -has not contacted cardiologist since then.  -hyperlipidemia   Sciatic nerve pain  -bilateral  -difficult time getting comfortable.   Past Surgical History:  Procedure Laterality Date   ANKLE FRACTURE SURGERY  1974   left and pinned then pins removed   ANKLE GANGLION CYST EXCISION  1994   right   BACK SURGERY  1998   discectomy and then fusion   CARDIAC CATHETERIZATION N/A 04/05/2016   Procedure: Right/Left Heart Cath and Coronary Angiography;  Surgeon: Laurey Morale, MD;  Location[facility] INVASIVE CV LAB;  Service: Cardiovascular;  Laterality: N/A;   CARDIOVERSION N/A 01/02/2020   Procedure: CARDIOVERSION;  Surgeon: Laurey Morale, MD;  Location: [facility] ENDOSCOPY;  Service: Cardiovascular;  Laterality: N/A;   KNEE ARTHROPLASTY  02/14/2012   Procedure: COMPUTER ASSISTED TOTAL KNEE ARTHROPLASTY;  Surgeon: Cammy Copa, MD;  Location: [facility] OR;  Service: Orthopedics;  Laterality: Left;  Left total knee arthroplasty   TEE WITHOUT CARDIOVERSION N/A 01/02/2020   Procedure: TRANSESOPHAGEAL ECHOCARDIOGRAM (TEE);  Surgeon: Laurey Morale, MD;  Location: [facility] ENDOSCOPY;  Service: Cardiovascular;  Laterality: N/A;   TONSILLECTOMY  1972   TUBAL LIGATION  1996   UMBILICAL HERNIA REPAIR  1996    Past Medical History:  Diagnosis Date   Allergy    seasonal   Arthritis    Diabetes mellitus    GERD (gastroesophageal  reflux disease)    History of pneumonia    15 years ago   Hyperlipidemia    Hypertension    PE (pulmonary embolism) 12/2015     Medications: Outpatient Medications Prior to Visit  Medication Sig   atorvastatin (LIPITOR) 80 MG tablet TAKE 1 TABLET BY MOUTH ONCE DAILY AT  6PM   blood glucose meter kit and supplies KIT Dispense based on patient and insurance preference. Use up to four times daily as directed.   budesonide-formoterol (SYMBICORT) 160-4.5 MCG/ACT inhaler Inhale 1 puff into the lungs 2 (two) times daily.   buPROPion (WELLBUTRIN SR) 150 MG 12 hr tablet 150mg  daily for 3 days, then 150 mg Twice daily   glimepiride (AMARYL) 4 MG tablet Take 1 tablet (4 mg total) by mouth 2 (two) times daily.   Insulin Pen Needle (PEN NEEDLES) 32G X 4 MM MISC 1 Units by Does not apply route daily.   ipratropium (ATROVENT) 0.02 % nebulizer solution Take 2.5 mLs (0.5 mg total) by nebulization every 6 (six) hours as needed for wheezing or shortness of breath.   Krill Oil CAPS Take 1 capsule by mouth daily.    [DISCONTINUED] budesonide (PULMICORT) 0.25 MG/2ML nebulizer solution Take 4 mLs (0.5 mg total) by nebulization daily.   [DISCONTINUED] cyclobenzaprine (FLEXERIL) 10 MG tablet Take 1 tablet (10 mg total) by mouth 3 (three) times daily as needed. for muscle spams   [DISCONTINUED] ergocalciferol (DRISDOL) 1.25 MG (50000 UT) capsule Take 1 capsule (  50,000 Units total) by mouth once a week.   [DISCONTINUED] furosemide (LASIX) 40 MG tablet Take 1 tablet (40 mg total) by mouth daily.   [DISCONTINUED] gabapentin (NEURONTIN) 400 MG capsule TAKE 1 CAPSULE BY MOUTH THREE TIMES DAILY   [DISCONTINUED] insulin glargine (LANTUS SOLOSTAR) 100 UNIT/ML Solostar Pen Inject 25 Units into the skin daily.   [DISCONTINUED] ipratropium (ATROVENT HFA) 17 MCG/ACT inhaler Inhale 2 puffs into the lungs every 6 (six) hours as needed for wheezing.   [DISCONTINUED] metFORMIN (GLUCOPHAGE) 850 MG tablet Take 1 tablet (850 mg total)  by mouth 2 (two) times daily with a meal.   [DISCONTINUED] metoprolol (TOPROL-XL) 200 MG 24 hr tablet Take 1 tablet (200 mg total) by mouth daily.   [DISCONTINUED] rivaroxaban (XARELTO) 20 MG TABS tablet Take 1 tablet (20 mg total) by mouth daily.   [DISCONTINUED] sacubitril-valsartan (ENTRESTO) 49-51 MG Take 1 tablet by mouth 2 (two) times daily.   No facility-administered medications prior to visit.    Review of Systems See HPI     Last CBC Lab Results  Component Value Date   WBC 9.3 05/24/2022   HGB 10.8 (L) 05/24/2022   HCT 35.5 (L) 05/24/2022   MCV 84.7 05/24/2022   MCH 25.8 (L) 05/24/2022   RDW 14.5 05/24/2022   PLT 333 05/24/2022   Last metabolic panel Lab Results  Component Value Date   GLUCOSE 85 06/14/2022   NA 139 06/14/2022   K 4.6 06/14/2022   CL 101 06/14/2022   CO2 28 06/14/2022   BUN 17 06/14/2022   CREATININE 1.30 (H) 06/14/2022   GFRNONAA 46 (L) 06/14/2022   CALCIUM 8.6 (L) 06/14/2022   PHOS 4.5 06/08/2012   PROT 6.4 02/02/2022   ALBUMIN 3.9 02/02/2022   LABGLOB 2.5 02/02/2022   AGRATIO 1.6 02/02/2022   BILITOT 0.3 02/02/2022   ALKPHOS 127 (H) 02/02/2022   AST 9 02/02/2022   ALT 3 02/02/2022   ANIONGAP 10 06/14/2022   Last lipids Lab Results  Component Value Date   CHOL 139 02/02/2022   HDL 33 (L) 02/02/2022   LDLCALC 80 02/02/2022   TRIG 145 02/02/2022   CHOLHDL 4.2 02/02/2022   Last hemoglobin A1c Lab Results  Component Value Date   HGBA1C 7.5 08/04/2022   Last thyroid functions Lab Results  Component Value Date   TSH 3.630 02/02/2022   Last vitamin D Lab Results  Component Value Date   VD25OH 8.2 (L) 02/02/2022       Objective     Today's Vitals   08/04/22 1045  BP: (Abnormal) 144/85  Pulse: (Abnormal) 58  SpO2: 97%  Weight: 265 lb 12.8 oz (120.6 kg)  Height: 5\' 6"  (1.676 m)   Body mass index is 42.9 kg/m.  BP Readings from Last 3 Encounters:  08/04/22 (Abnormal) 144/85  05/24/22 130/78  04/04/22 115/74     Wt Readings from Last 3 Encounters:  08/04/22 265 lb 12.8 oz (120.6 kg)  05/24/22 255 lb 12.8 oz (116 kg)  04/04/22 254 lb 12.8 oz (115.6 kg)    Physical Exam Vitals and nursing note reviewed.  Constitutional:      Appearance: Normal appearance. She is well-developed. She is obese.  HENT:     Head: Normocephalic and atraumatic.     Nose: Nose normal.     Mouth/Throat:     Mouth: Mucous membranes are moist.     Pharynx: Oropharynx is clear.  Eyes:     Extraocular Movements: Extraocular movements intact.  Conjunctiva/sclera: Conjunctivae normal.     Pupils: Pupils are equal, round, and reactive to light.  Neck:     Vascular: No carotid bruit.  Cardiovascular:     Rate and Rhythm: Normal rate and regular rhythm.     Pulses: Normal pulses.     Heart sounds: Normal heart sounds.  Pulmonary:     Effort: Pulmonary effort is normal.     Breath sounds: Normal breath sounds.  Abdominal:     Palpations: Abdomen is soft.  Musculoskeletal:        General: Normal range of motion.     Cervical back: Normal range of motion and neck supple.  Lymphadenopathy:     Cervical: No cervical adenopathy.  Skin:    General: Skin is warm and dry.     Capillary Refill: Capillary refill takes less than 2 seconds.  Neurological:     General: No focal deficit present.     Mental Status: She is alert and oriented to person, place, and time.  Psychiatric:        Mood and Affect: Mood normal.        Behavior: Behavior normal.        Thought Content: Thought content normal.        Judgment: Judgment normal.     Results for orders placed or performed in visit on 08/04/22  POCT glycosylated hemoglobin (Hb A1C)  Result Value Ref Range   Hemoglobin A1C     HbA1c POC (<> result, manual entry) 7.5 4.0 - 5.6 %   HbA1c, POC (prediabetic range)     HbA1c, POC (controlled diabetic range)      Assessment & Plan    Type 2 diabetes mellitus with diabetic polyneuropathy, with long-term current use of  insulin (HCC) Assessment & Plan: HgbA1c 7.5 today.  -Lantus to 25 units daily  -continue other diabetic medication as prescribed  -recheck HgbA1c in 4 months.   Orders: -     Lantus SoloStar; Inject 25 Units into the skin daily.  Dispense: 15 mL; Refill: 3 -     metFORMIN HCl; Take 1 tablet (850 mg total) by mouth 2 (two) times daily with a meal.  Dispense: 180 tablet; Refill: 3  Hypertension associated with type 2 diabetes mellitus (HCC) Assessment & Plan: Blood pressure slightly elevated today. Continue medication as prescribed. Refills sent to pharmacy. Recommend she visit with cardiology for further evaluation and work up.   Orders: -     POCT glycosylated hemoglobin (Hb A1C)  Hyperlipidemia associated with type 2 diabetes mellitus (HCC) Assessment & Plan: Continue lipid lowering medications as prescribed    Orders: -     POCT glycosylated hemoglobin (Hb A1C)  Pulmonary emphysema, unspecified emphysema type (HCC) Assessment & Plan: Increased shortness of breath and wheezing. Renew Pulmicort nebulizer solution. Use up to 4 times daily as needed for wheezing and shortness of breath.   Orders: -     Budesonide; Take 4 mLs (0.5 mg total) by nebulization daily.  Dispense: 120 mL; Refill: 3 -     Atrovent HFA; Inhale 2 puffs into the lungs every 6 (six) hours as needed for wheezing.  Dispense: 1 each; Refill: 11  Chronic pain syndrome Assessment & Plan: May continue gabapentin 400 mg up to  4 times daily as needed for neuropathy and pain. May take flexeril 10 mg at bedtime as needed for muscle pai and tightness. New prescriptions sent to her pharmacy  Orders: -     Cyclobenzaprine HCl;  Take 1 tablet (10 mg total) by mouth 3 (three) times daily as needed. for muscle spams  Dispense: 270 tablet; Refill: 1 -     Gabapentin; Take 1 capsule (400 mg total) by mouth 3 (three) times daily.  Dispense: 90 capsule; Refill: 1  Vitamin D deficiency Assessment & Plan: Continue Drisdol  50000 iu weekly, which is high dose vitamin d. She will take this weekly for next few months.   Orders: -     Ergocalciferol; Take 1 capsule (50,000 Units total) by mouth once a week.  Dispense: 12 capsule; Refill: 3  Stage 3 chronic kidney disease, unspecified whether stage 3a or 3b CKD (HCC) Assessment & Plan: Continue furosemide as prescribed   Orders: -     Furosemide; Take 1 tablet (40 mg total) by mouth daily.  Dispense: 90 tablet; Refill: 1  Combined systolic and diastolic congestive heart failure (HCC) Assessment & Plan: Patient reporting 40 pound weight gain, mostly fluid with increased shortness of breath. Renew Entresto. Continue Lasix 40 mg daily. Encouraged her to contact cardiology for further work up of new symptoms.   Orders: -     Metoprolol Succinate ER; Take 1 tablet (200 mg total) by mouth daily.  Dispense: 90 tablet; Refill: 3 -     Entresto; Take 1 tablet by mouth 2 (two) times daily.  Dispense: 180 tablet; Refill: 3  Pulmonary thromboembolism (HCC) -     Rivaroxaban; Take 1 tablet (20 mg total) by mouth daily.  Dispense: 90 tablet; Refill: 3     Return in about 4 months (around 12/04/2022) for diabetes with HgbA1c check. will need MWV in next available slot .         Carlean Jews, NP  [facility] [phone] (phone) [phone] (fax)  [facility]

## 2022-08-18 ENCOUNTER — Other Ambulatory Visit: Payer: Self-pay

## 2022-08-18 DIAGNOSIS — J439 Emphysema, unspecified: Secondary | ICD-10-CM

## 2022-08-25 ENCOUNTER — Encounter (HOSPITAL_COMMUNITY): Payer: PPO

## 2022-08-30 ENCOUNTER — Encounter: Payer: PPO | Admitting: Nurse Practitioner

## 2022-09-04 DIAGNOSIS — E559 Vitamin D deficiency, unspecified: Secondary | ICD-10-CM | POA: Insufficient documentation

## 2022-09-04 NOTE — Assessment & Plan Note (Signed)
Increased shortness of breath and wheezing. Renew Pulmicort nebulizer solution. Use up to 4 times daily as needed for wheezing and shortness of breath.

## 2022-09-04 NOTE — Assessment & Plan Note (Addendum)
May continue gabapentin 400 mg up to  4 times daily as needed for neuropathy and pain. May take flexeril 10 mg at bedtime as needed for muscle pai and tightness. New prescriptions sent to her pharmacy

## 2022-09-04 NOTE — Assessment & Plan Note (Signed)
Continue Drisdol 50000 iu weekly, which is high dose vitamin d. She will take this weekly for next few months.  

## 2022-09-04 NOTE — Assessment & Plan Note (Signed)
Continue furosemide as prescribed

## 2022-09-04 NOTE — Assessment & Plan Note (Signed)
Blood pressure slightly elevated today. Continue medication as prescribed. Refills sent to pharmacy. Recommend she visit with cardiology for further evaluation and work up.

## 2022-09-04 NOTE — Assessment & Plan Note (Signed)
HgbA1c 7.5 today.  -Lantus to 25 units daily  -continue other diabetic medication as prescribed  -recheck HgbA1c in 4 months.

## 2022-09-04 NOTE — Assessment & Plan Note (Signed)
Continue lipid lowering medications as prescribed

## 2022-09-04 NOTE — Assessment & Plan Note (Signed)
Patient reporting 40 pound weight gain, mostly fluid with increased shortness of breath. Renew Entresto. Continue Lasix 40 mg daily. Encouraged her to contact cardiology for further work up of new symptoms.

## 2022-09-07 ENCOUNTER — Encounter (HOSPITAL_COMMUNITY): Payer: Self-pay

## 2022-09-07 ENCOUNTER — Ambulatory Visit (HOSPITAL_COMMUNITY)
Admission: RE | Admit: 2022-09-07 | Discharge: 2022-09-07 | Disposition: A | Discharge status: Home / Self Care | Payer: PPO | Source: Ambulatory Visit | Attending: Internal Medicine | Admitting: Internal Medicine

## 2022-09-07 VITALS — BP 166/98 | HR 66 | Wt 264.8 lb

## 2022-09-07 DIAGNOSIS — Z8616 Personal history of COVID-19: Secondary | ICD-10-CM | POA: Diagnosis not present

## 2022-09-07 DIAGNOSIS — N183 Chronic kidney disease, stage 3 unspecified: Secondary | ICD-10-CM

## 2022-09-07 DIAGNOSIS — I428 Other cardiomyopathies: Secondary | ICD-10-CM | POA: Diagnosis not present

## 2022-09-07 DIAGNOSIS — J449 Chronic obstructive pulmonary disease, unspecified: Secondary | ICD-10-CM | POA: Insufficient documentation

## 2022-09-07 DIAGNOSIS — Z86711 Personal history of pulmonary embolism: Secondary | ICD-10-CM | POA: Diagnosis not present

## 2022-09-07 DIAGNOSIS — Z7984 Long term (current) use of oral hypoglycemic drugs: Secondary | ICD-10-CM | POA: Insufficient documentation

## 2022-09-07 DIAGNOSIS — I5041 Acute combined systolic (congestive) and diastolic (congestive) heart failure: Secondary | ICD-10-CM | POA: Diagnosis not present

## 2022-09-07 DIAGNOSIS — I447 Left bundle-branch block, unspecified: Secondary | ICD-10-CM | POA: Diagnosis not present

## 2022-09-07 DIAGNOSIS — F17200 Nicotine dependence, unspecified, uncomplicated: Secondary | ICD-10-CM

## 2022-09-07 DIAGNOSIS — F17210 Nicotine dependence, cigarettes, uncomplicated: Secondary | ICD-10-CM | POA: Diagnosis not present

## 2022-09-07 DIAGNOSIS — Z794 Long term (current) use of insulin: Secondary | ICD-10-CM | POA: Insufficient documentation

## 2022-09-07 DIAGNOSIS — Z79899 Other long term (current) drug therapy: Secondary | ICD-10-CM | POA: Insufficient documentation

## 2022-09-07 DIAGNOSIS — E1122 Type 2 diabetes mellitus with diabetic chronic kidney disease: Secondary | ICD-10-CM | POA: Diagnosis not present

## 2022-09-07 DIAGNOSIS — I130 Hypertensive heart and chronic kidney disease with heart failure and stage 1 through stage 4 chronic kidney disease, or unspecified chronic kidney disease: Secondary | ICD-10-CM | POA: Diagnosis not present

## 2022-09-07 DIAGNOSIS — Z7901 Long term (current) use of anticoagulants: Secondary | ICD-10-CM | POA: Diagnosis not present

## 2022-09-07 DIAGNOSIS — I4892 Unspecified atrial flutter: Secondary | ICD-10-CM | POA: Insufficient documentation

## 2022-09-07 LAB — BASIC METABOLIC PANEL
Anion gap: 11 (ref 5–15)
BUN: 20 mg/dL (ref 8–23)
CO2: 27 mmol/L (ref 22–32)
Calcium: 9.3 mg/dL (ref 8.9–10.3)
Chloride: 98 mmol/L (ref 98–111)
Creatinine, Ser: 1.47 mg/dL — ABNORMAL HIGH (ref 0.44–1.00)
GFR, Estimated: 40 mL/min — ABNORMAL LOW (ref >60)
Glucose, Bld: 133 mg/dL — ABNORMAL HIGH (ref 70–99)
Potassium: 4.8 mmol/L (ref 3.5–5.1)
Sodium: 136 mmol/L (ref 135–145)

## 2022-09-07 LAB — CBC
HCT: 35.9 % — ABNORMAL LOW (ref 36.0–46.0)
Hemoglobin: 11.2 g/dL — ABNORMAL LOW (ref 12.0–15.0)
MCH: 26 pg (ref 26.0–34.0)
MCHC: 31.2 g/dL (ref 30.0–36.0)
MCV: 83.5 fL (ref 80.0–100.0)
Platelets: 330 10*3/uL (ref 150–400)
RBC: 4.3 MIL/uL (ref 3.87–5.11)
RDW: 15 % (ref 11.5–15.5)
WBC: 9.1 10*3/uL (ref 4.0–10.5)
nRBC: 0 % (ref 0.0–0.2)

## 2022-09-07 MED ORDER — ENTRESTO 97-103 MG PO TABS: 1.0000 | ORAL_TABLET | Freq: Two times a day (BID) | ORAL | 11 refills | Status: DC

## 2022-09-07 NOTE — Patient Instructions (Addendum)
INCREASE Entresto to 97/103 mg one tab twice a day   Labs today We will only contact you if something comes back abnormal or we need to make some changes. Otherwise no news is good news!  Labs needed in 7-10 days  Your physician wants you to follow-up in: 12 months with Dr Earlean Shawl will receive a reminder letter in the mail two months in advance. If you don't receive a letter, please call our office to schedule the follow-up appointment.   Do the following things EVERYDAY: Weigh yourself in the morning before breakfast. Write it down and keep it in a log. Take your medicines as prescribed Eat low salt foods--Limit salt (sodium) to 2000 mg per day.  Stay as active as you can everyday Limit all fluids for the day to less than 2 liters  At the [facility], you and your health needs are our priority. As part of our continuing mission to provide you with exceptional heart care, we have created designated Provider Care Teams. These Care Teams include your primary Cardiologist (physician) and Advanced Practice Providers (APPs- Physician Assistants and Nurse Practitioners) who all work together to provide you with the care you need, when you need it.   You may see any of the following providers on your designated Care Team at your next follow up: Dr Arvilla Meres Dr Marca Ancona Dr. Marcos Eke, NP Robbie Lis, [place] [facility] [place], [place] Brynda Peon, NP Karle Plumber, PharmD   Please be sure to bring in all your medications bottles to every appointment.    Thank you for choosing [facility]   If you have any questions or concerns before your next appointment please send [place] a message through [place] or call our office at [phone].    TO LEAVE A MESSAGE FOR THE NURSE SELECT OPTION 2, PLEASE LEAVE A MESSAGE INCLUDING: YOUR NAME DATE OF BIRTH CALL BACK NUMBER REASON FOR CALL**this is  important as we prioritize the call backs  YOU WILL RECEIVE A CALL BACK THE SAME DAY AS LONG AS YOU CALL BEFORE 4:00 PM

## 2022-09-07 NOTE — Progress Notes (Signed)
Date:  09/07/2022   ID:  [name], DOB 08/04/1955, MRN [id number]   Provider location: [facility] Type of Visit: Established patient   PCP:  Carlean Jews, NP  Cardiologist:  Dr. Shirlee Latch   History of Present Illness: Kaitlin Cantrell is a 67 y.o. female who has a history of COPD, tobacco abuse, DM, HTN, arthritis, and chronic pain syndrome and presented to [facility] with SOB in 8/17.  She had had several months of increasing exertional dyspnea prior to this.  CT angio 12/29/15 showed submassive PE with concerns for right heart strain. Now on Xarelto.  Echo 12/30/15 showed LVEF 20-25%, Grade 3 DD, Mod MR, mod LAE, Mild RV dilation with mildly reduced systolic function, Moderate RAE, Moderate TR. Peak PA pressure 54 mm Hg.  Cardiac MRI (9/17) showed EF 15%, moderate LV dilation, mild RV dilation/moderately decreased RV systolic function, no LGE.  She was diuresed in the hospital and begun on Xarelto.   Smokes 1 ppd for many years.  She tried Chantix but did not stop. No ETOH or drug use.  No history of cancer or recent surgery, no long car/plane trips prior to her PE.    In 9/17, spironolactone was discontinued due to persistent hyperkalemia.   RHC/LHC in 12/17 showed no significant CAD, optimized filling pressures, low but not markedly low cardiac output.    Echo in 3/18 showed  EF improved to 55% with mild LVH.  Repeat echo 7/20, EF 55-60%.     She was admitted 8/21 for COVID PNA. Hospital course c/b atrial flutter w/ RVR. She did not require intubation. She was seen by general cardiology w/ recommendations to treat w/ rate control w/ metoprolol + continuation of Xarelto. Recommend eventual DCCV once infection cleared.   She had successful TEE/DCCV to NSR in 9/21. TEE showed normal EF 55%.    AHF clinic visit 1/24, overall doing well, some SOB with activity. EKG showed new LBBB, echo ordered.   Echo 2/24 2/2 new LBBB: EF 60-65%, normal RV, mild MR/TR  Today  she returns for AHF follow up. Overall feeling well. Denies palpitations, CP, dizziness, edema, or PND/Orthopnea. chronic SOB. Appetite ok. No fever or chills. Does not weigh at home. Taking all medications.  She is still smoking, is smoking <1 pack/day.   ECG (personally reviewed): NSR with PACs 63 bpm  Labs (10/23): LDL 80, HDL 33, K 5.2, creatinine 8.33 Labs (2/24): K 4.6, SCr 1.3  PMH: 1. PE: 12/2015 CT angio 12/29/15 submassive PE with concerns for right heart strain. Now on Xarelto.  2. Cardiomyopathy:  Found 8/17.  - CMRI 01/01/2016: Nonischemic cardiomyopathy.  EF 15%, moderate LV dilation, mild RV dilation/moderately decreased RV systolic function.  No LGE: no infiltrative or inflammatory process noted, no evidence for prior MI. - ECHO  12/30/2015: EF 20-25%. Grade III DD, Mod TR, PASP 54, mildly decreased RV systolic function.  - Hyperkalemia with spironolactone.  - LHC/RHC (12/17): No CAD, mean RA 2, PA 39/13 mean 23, mean PCWP 7, CI 2.1.  - Echo (3/18): EF 55%, mild LVH.  - Echo (7/20): EF 55-60%, normal RV.  - TEE (9/21): EF 55%, RV normal, mild MR.  - Echo 2/24 2/2 new LBBB: EF 60-65%, normal RV, mild MR/TR 3. ABIs 01/05/2016 normal   4. COPD: Active smoker.  5. Type II diabetes 6. HTN 7. Chronic pain syndrome 8. CKD stage 3: Suspect diabetic nephropathy.  9. Atrial Flutter: DCCV to NSR in  9/21.    Current Outpatient Medications  Medication Sig Dispense Refill   atorvastatin (LIPITOR) 80 MG tablet TAKE 1 TABLET BY MOUTH ONCE DAILY AT  6PM 90 tablet 3   blood glucose meter kit and supplies KIT Dispense based on patient and insurance preference. Use up to four times daily as directed. 1 each 0   budesonide (PULMICORT) 0.25 MG/2ML nebulizer solution Take 4 mLs (0.5 mg total) by nebulization daily. 120 mL 3   budesonide-formoterol (SYMBICORT) 160-4.5 MCG/ACT inhaler Inhale 1 puff into the lungs 2 (two) times daily. 3 each 4   buPROPion (WELLBUTRIN SR) 150 MG 12 hr tablet 150mg   daily for 3 days, then 150 mg Twice daily 200 tablet 3   cyclobenzaprine (FLEXERIL) 10 MG tablet Take 1 tablet (10 mg total) by mouth 3 (three) times daily as needed. for muscle spams 270 tablet 1   ergocalciferol (DRISDOL) 1.25 MG (50000 UT) capsule Take 1 capsule (50,000 Units total) by mouth once a week. 12 capsule 3   furosemide (LASIX) 40 MG tablet Take 1 tablet (40 mg total) by mouth daily. 90 tablet 1   gabapentin (NEURONTIN) 400 MG capsule Take 1 capsule (400 mg total) by mouth 3 (three) times daily. 90 capsule 1   glimepiride (AMARYL) 4 MG tablet Take 1 tablet (4 mg total) by mouth 2 (two) times daily. 180 tablet 3   insulin glargine (LANTUS SOLOSTAR) 100 UNIT/ML Solostar Pen Inject 25 Units into the skin daily. 15 mL 3   Insulin Pen Needle (PEN NEEDLES) 32G X 4 MM MISC 1 Units by Does not apply route daily. 30 each 1   ipratropium (ATROVENT HFA) 17 MCG/ACT inhaler Inhale 2 puffs into the lungs every 6 (six) hours as needed for wheezing. 1 each 11   ipratropium (ATROVENT) 0.02 % nebulizer solution Take 2.5 mLs (0.5 mg total) by nebulization every 6 (six) hours as needed for wheezing or shortness of breath. 300 mL 12   Krill Oil CAPS Take 1 capsule by mouth daily.      metFORMIN (GLUCOPHAGE) 850 MG tablet Take 1 tablet (850 mg total) by mouth 2 (two) times daily with a meal. 180 tablet 3   metoprolol (TOPROL-XL) 200 MG 24 hr tablet Take 1 tablet (200 mg total) by mouth daily. 90 tablet 3   rivaroxaban (XARELTO) 20 MG TABS tablet Take 1 tablet (20 mg total) by mouth daily. 90 tablet 3   sacubitril-valsartan (ENTRESTO) 49-51 MG Take 1 tablet by mouth 2 (two) times daily. 180 tablet 3   No current facility-administered medications for this visit.    Allergies:   Albuterol and Morphine and related   Social History:  The patient  reports that she has been smoking cigarettes. She has a 45.00 pack-year smoking history. She has never used smokeless tobacco. She reports current alcohol use. She  reports that she does not use drugs.   Family History:  The patient's family history includes COPD in her paternal grandfather; Cancer in her maternal grandmother; Colon polyps in her father; Diabetes in her brother, father, mother, and paternal grandmother; Heart disease in her father, paternal grandfather, and paternal grandmother; Heart murmur in her mother; Hypertension in her brother, brother, and mother.   ROS:  Please see the history of present illness.   All other systems are personally reviewed and negative.   Vitals:   There were no vitals taken for this visit.  Physical exam: General:  well appearing.  No respiratory difficulty. Walked into clinic HEENT:  normal Neck: supple. JVD ~8 cm. Carotids 2+ bilat; no bruits. No lymphadenopathy or thyromegaly appreciated. Cor: PMI nondisplaced. Regular rate & rhythm. No rubs, gallops or murmurs. Lungs: diminished Abdomen: soft, nontender, nondistended. No hepatosplenomegaly. No bruits or masses. Good bowel sounds. Extremities: no cyanosis, clubbing, rash, trace LLE edema Neuro: alert & oriented x 3, cranial nerves grossly intact. moves all 4 extremities w/o difficulty. Affect pleasant.   Recent Labs: 02/02/2022: ALT 3; TSH 3.630 05/24/2022: Hemoglobin 10.8; Platelets 333 06/14/2022: BUN 17; Creatinine, Ser 1.30; Potassium 4.6; Sodium 139   EKG NSR with PACs 63 bpm  Wt Readings from Last 3 Encounters:  08/04/22 120.6 kg (265 lb 12.8 oz)  05/24/22 116 kg (255 lb 12.8 oz)  04/04/22 115.6 kg (254 lb 12.8 oz)    ASSESSMENT AND PLAN: 1. Chronic systolic => diastolic CHF: Nonischemic cardiomyopathy.  ECHO 12/2015 EF 15-20% with diffuse hypokinesis.  Uncertain etiology.  No chest pain. Symptoms began as orthopnea around 6/17, then developed progressive exertional dyspnea.  ECG shows anteroseptal MI but no change from 2013 ECG.  Cardiac MRI did not show LGE. HIV negative, thyroid indices ok (mild TSH elevation but normal T3/T4), SPEP negative.   12/17 RHC/LHC showed no angiographic CAD, optimized filling pressures, low but not markedly low cardiac output.  Echo 3/18 showed recovery of LV function with EF up to 55%. Echo repeated 7/20 and EF still 55-60%. TEE in 9/21 showed EF 55%. NYHA class III but suspect symptoms are most likely due to COPD.   - She is not volume overloaded on exam. Of note, she had a new LBBB 1/24.   - Echo 2/24 2/2 new LBBB: EF 60-65%, normal RV, mild MR/TR - Continue Toprol XL 200 mg daily  - Increase Entresto 49/51>97/103 bid.   - Off spironolactone with persistent hyperkalemia.   - Continue Lasix 40 mg daily.  - BMET today, repeat in 7-10 days 2. H/O PE: Submassive on CT.  Etiology uncertain: no known cancer, long trip, recent surgery.  Possibly related to stasis in the setting of cardiomyopathy with severely decreased systolic function.  - Given cardiomyopathy and submassive PE with no certain trigger, she has been continued on long-term anticoagulation. Remains on Xarelto. CBC today.  2. Atrial Flutter: DCCV in 9/21.  She is in NSR today.  - Continue Xarelto, CBC today.  - Continue metoprolol  3. CKD: Stage III. BMET today.  4. Active smoker: She failed Chantix.   - Tried Wellbutrin for about a week and a half but felt very sluggish and had increased cravings. Per patient request, will wait to retry for now, has upcoming summer plans.  5. LBBB - new during last visit - echo stable, as above  F/u in 1 year with Dr. Shirlee Latch.   Signed, Alen Bleacher, NP  09/07/2022  [facility] [address] [facility] [place] [address] [phone] (office) [phone] (fax)

## 2022-09-13 ENCOUNTER — Telehealth: Payer: Self-pay

## 2022-09-13 NOTE — Telephone Encounter (Signed)
Called patient to schedule Medicare Annual Wellness Visit (AWV). Left message for patient to call back and schedule Medicare Annual Wellness Visit (AWV).  Last date of AWV: 12/28/16  Please schedule an appointment at any time On Annual Wellness Visit Schedule.     Thank you ,  Agnes Lawrence, CMA (AAMA)  [facility]- AWV Program [phone]

## 2022-09-14 ENCOUNTER — Ambulatory Visit (HOSPITAL_COMMUNITY)
Admission: RE | Admit: 2022-09-14 | Discharge: 2022-09-14 | Disposition: A | Discharge status: Home / Self Care | Payer: PPO | Source: Ambulatory Visit | Attending: Cardiology | Admitting: Cardiology

## 2022-09-14 DIAGNOSIS — I5041 Acute combined systolic (congestive) and diastolic (congestive) heart failure: Secondary | ICD-10-CM | POA: Insufficient documentation

## 2022-09-14 LAB — BASIC METABOLIC PANEL
Anion gap: 10 (ref 5–15)
BUN: 14 mg/dL (ref 8–23)
CO2: 29 mmol/L (ref 22–32)
Calcium: 9.1 mg/dL (ref 8.9–10.3)
Chloride: 101 mmol/L (ref 98–111)
Creatinine, Ser: 1.28 mg/dL — ABNORMAL HIGH (ref 0.44–1.00)
GFR, Estimated: 47 mL/min — ABNORMAL LOW (ref >60)
Glucose, Bld: 123 mg/dL — ABNORMAL HIGH (ref 70–99)
Potassium: 4.7 mmol/L (ref 3.5–5.1)
Sodium: 140 mmol/L (ref 135–145)

## 2022-09-29 NOTE — Addendum Note (Signed)
Addended by: Vincent Gros on: 09/29/2022 06:24 AM   Modules accepted: Level of Service

## 2022-11-18 ENCOUNTER — Telehealth (HOSPITAL_COMMUNITY): Payer: Self-pay | Admitting: Pharmacy Technician

## 2022-11-18 NOTE — Telephone Encounter (Signed)
Advanced Heart Failure Patient Advocate Encounter  Medication Samples have been provided to the patient.  Drug name: Xarelto       Strength: 20 mg        Qty: 3 bottles  LOT: [id number]  Exp.Date: 01/2024  Dosing instructions: Take one tablet by mouth daily.  The patient has been instructed regarding the correct time, dose, and frequency of taking this medication, including desired effects and most common side effects.   Kaitlin Cantrell [facility] 2:34 PM 11/18/2022

## 2022-12-06 NOTE — Progress Notes (Signed)
[facility] Quality Team Note   Name: Kaitlin Cantrell Date of Birth: [birth year] MRN: [id number] Date: 12/06/2022  [facility] Quality Team has reviewed this patient's chart, please see recommendations below:  [facility] Quality Other; (Patient has appointment on 12/08/2022, she is due for a colonoscopy and a diabetic retinal eye screening. Last eye screening was in 2017)

## 2022-12-08 ENCOUNTER — Ambulatory Visit: Payer: PPO | Admitting: Family Medicine

## 2022-12-08 ENCOUNTER — Ambulatory Visit: Payer: PPO | Admitting: Nurse Practitioner

## 2022-12-08 NOTE — Progress Notes (Deleted)
   Established Patient Office Visit  Subjective   Patient ID: Kaitlin Cantrell, female    DOB: 26-Nov-1955  Age: 67 y.o. MRN: [id number]  No chief complaint on file.   HPI  DM2-foot exam, Optho referral.  Glimepiride, Lantus, metformin  COPD-Symbicort? Smoker still  HFrEF-Entresto,.  Why not entresto?     The 10-year ASCVD risk score (Arnett DK, et al., 2019) is: 38.4%  Health Maintenance Due  Topic Date Due   COVID-19 Vaccine (1) Never done   Zoster Vaccines- Shingrix (1 of 2) 12/06/1976   PAP SMEAR-Modifier  03/10/2014   OPHTHALMOLOGY EXAM  03/30/2017   Pneumonia Vaccine 59+ Years old (2 of 2 - PCV) 06/23/2017   Medicare Annual Wellness (AWV)  12/28/2017   FOOT EXAM  05/17/2018   DEXA SCAN  Never done   INFLUENZA VACCINE  12/01/2022      Objective:     There were no vitals taken for this visit. {Vitals History (Optional):23777}  Physical Exam   No results found for any visits on 12/08/22.      Assessment & Plan:   There are no diagnoses linked to this encounter.   No follow-ups on file.    [name], MD

## 2022-12-09 ENCOUNTER — Telehealth (HOSPITAL_COMMUNITY): Payer: Self-pay

## 2022-12-09 ENCOUNTER — Other Ambulatory Visit (HOSPITAL_COMMUNITY): Payer: Self-pay

## 2022-12-09 NOTE — Telephone Encounter (Signed)
Advanced Heart Failure Patient Advocate Encounter  Returned patient call regarding concerns with Entresto, Xarelto copays. Informed patient about enrolling in the Xarelto With Me program, and obtained grant for [facility].  The patient was approved for a [employer] grant that will help cover the cost of Entresto, Metoprolol.  Total amount awarded, $10,000.  Effective: 11/09/2022 - 11/08/2023.  BIN [id number] PCN [id number] [id number] ID [id number]  Pharmacy provided with approval and processing information. Patient informed via phone.  Burnell Blanks, CPhT Rx Patient Advocate Phone: [phone]

## 2022-12-12 NOTE — Progress Notes (Signed)
Established Patient Office Visit  Subjective   Patient ID: Kaitlin Cantrell, female    DOB: Oct 21, 1955  Age: 67 y.o. MRN: [id number]  Chief Complaint  Patient presents with   Diabetes    fasting    HPI  DM2-patient takes glimepiride, Lantus 25 units at bedtime, metformin.  Does not check her  blood sugar.  Has a glucometer but is uncertain how to use it.  Is open to using a continuous glucose monitor.  She has not reported any dizziness or hypoglycemic episodes.  She does not have an ophthalmologist.  She is open to a referral  COPD-patient still smokes.  Has thought about quitting and has tried quitting in the past but eventually restarted.  Takes Symbicort as well as budesonide and ipratropium.  Has an allergy to albuterol.  Is open to seeing a pulmonologist.  HFrEF-patient last saw cardiology in January of last year.  Does not believe she has ever talked to them about taking [place] or [place].  [place] and has issues with paying for it when she is in the donut hole.  Is working with them on reducing co-pays during this time.  Patient has declined Pap smear.  Has not had one in many years.  Does not want to have one. .  Pain-patient wondering if there is any other medications that she can take for pain instead of the Flexeril or gabapentin.  Pain is in the shoulders as well as the legs.  Patient needs refills on Flexeril and gabapentin today.   The 10-year ASCVD risk score (Arnett DK, et al., 2019) is: 34.1%  Health Maintenance Due  Topic Date Due   COVID-19 Vaccine (1) Never done   Zoster Vaccines- Shingrix (1 of 2) 12/06/1976   PAP SMEAR-Modifier  03/10/2014   OPHTHALMOLOGY EXAM  03/30/2017   Pneumonia Vaccine 42+ Years old (2 of 2 - PCV) 06/23/2017   Medicare Annual Wellness (AWV)  12/28/2017   FOOT EXAM  05/17/2018   DEXA SCAN  Never done   INFLUENZA VACCINE  12/01/2022      Objective:     BP (!) 154/88 (BP Location: Left Arm, Patient Position: Sitting,  Cuff Size: Large)   Pulse 64   Resp 18   Ht 5\' 6"  (1.676 m)   Wt 265 lb (120.2 kg)   SpO2 96%   BMI 42.77 kg/m    Physical Exam General: Alert, oriented CV: Regular in rhythm Pulmonary: No respiratory distress.  Patient voice is hoarse Extremities: Mild pedal edema bilaterally.  Normal sensation intact.  Dry skin.   Results for orders placed or performed in visit on 12/13/22  POCT HgB A1C  Result Value Ref Range   Hemoglobin A1C     HbA1c POC (<> result, manual entry) 7.7 4.0 - 5.6 %   HbA1c, POC (prediabetic range)     HbA1c, POC (controlled diabetic range)          Assessment & Plan:   Type 2 diabetes mellitus with diabetic polyneuropathy, with long-term current use of insulin (HCC) -     POCT glycosylated hemoglobin (Hb A1C) -     Ambulatory referral to Ophthalmology  Chronic pain syndrome Assessment & Plan: Will have pt follow up in one month to discuss long term pain mgmt.  Refill flexeril and gabapentin today.   Orders: -     Cyclobenzaprine HCl; Take 1 tablet (10 mg total) by mouth 3 (three) times daily as needed. for muscle spams  Dispense: 270  tablet; Refill: 1 -     Gabapentin; Take 1 capsule (400 mg total) by mouth 3 (three) times daily.  Dispense: 90 capsule; Refill: 1  Pulmonary emphysema, unspecified emphysema type (HCC) Assessment & Plan: Continue current inhalers.  Counseled on smoking cessation.  Referral to pulmonology sent in.    Orders: -     Ambulatory referral to Pulmonology  Type 2 diabetes mellitus without complication, without long-term current use of insulin (HCC) Assessment & Plan: Will send in ophthalmology referral.  Encourage patient to check her blood sugar at home.  Advised her she can bring in the glucometer to our office at her next visit and we can go over how to use it with her.  She is also open to a continuous glucose monitor.  I do not see any contraindication to starting Jardiance.  Searching through her chart, I do not see  any previous mention of any of the 3 SGLT2 inhibitors.  Has CKD but most recent BMP was with GFR >45.  Does not have a significant history of UTIs.  Cost may be an issue but will not know until we send in prescription.  Will send in prescription for Jardiance to help improve her diabetic control but also for her heart failure.   Other orders -     Empagliflozin; Take 1 tablet (10 mg total) by mouth daily before breakfast.  Dispense: 90 tablet; Refill: 1     Return in about 4 weeks (around 01/10/2023) for Pain.    [name], MD

## 2022-12-12 NOTE — Telephone Encounter (Signed)
Medication Samples have been left at registration desk for patient pick up. Drug name: Xarelto 20 MG Qty: 4x 7 ct packages LOT: [id number] Exp.: 01/2024 SIG: Take 1 tablet by mouth once daily   The patient has been instructed regarding the correct time, dose, and frequency of taking this medication, including desired effects and most common side effects.

## 2022-12-13 ENCOUNTER — Ambulatory Visit (INDEPENDENT_AMBULATORY_CARE_PROVIDER_SITE_OTHER): Payer: PPO | Admitting: Family Medicine

## 2022-12-13 ENCOUNTER — Encounter: Payer: Self-pay | Admitting: Family Medicine

## 2022-12-13 VITALS — BP 154/88 | HR 64 | Resp 18 | Ht 66.0 in | Wt 265.0 lb

## 2022-12-13 DIAGNOSIS — J439 Emphysema, unspecified: Secondary | ICD-10-CM | POA: Diagnosis not present

## 2022-12-13 DIAGNOSIS — E119 Type 2 diabetes mellitus without complications: Secondary | ICD-10-CM

## 2022-12-13 DIAGNOSIS — E1142 Type 2 diabetes mellitus with diabetic polyneuropathy: Secondary | ICD-10-CM

## 2022-12-13 DIAGNOSIS — Z794 Long term (current) use of insulin: Secondary | ICD-10-CM | POA: Diagnosis not present

## 2022-12-13 DIAGNOSIS — G894 Chronic pain syndrome: Secondary | ICD-10-CM | POA: Diagnosis not present

## 2022-12-13 LAB — POCT GLYCOSYLATED HEMOGLOBIN (HGB A1C): HbA1c POC (<> result, manual entry): 7.7 % (ref 4.0–5.6)

## 2022-12-13 MED ORDER — CYCLOBENZAPRINE HCL 10 MG PO TABS: 10.0000 mg | ORAL_TABLET | Freq: Three times a day (TID) | ORAL | 1 refills | Status: DC | PRN

## 2022-12-13 MED ORDER — GABAPENTIN 400 MG PO CAPS: 400.0000 mg | ORAL_CAPSULE | Freq: Three times a day (TID) | ORAL | 1 refills | Status: DC

## 2022-12-13 NOTE — Patient Instructions (Addendum)
It was nice to see you today,  We addressed the following topics today: -I would like to see back 1 month so we can talk about alternative pain medications - I will look into prescribing Jardiance and if it is something we can prescribe I will talk to you about it and send in a prescription.  This is for heart disease and diabetes - I sent in refills for your medications - I will send in referral to pulmonology as well as ophthalmology.  Have a great day,  Frederic Jericho, MD

## 2022-12-14 NOTE — Assessment & Plan Note (Addendum)
Will send in ophthalmology referral.  Encourage patient to check her blood sugar at home.  Advised her she can bring in the glucometer to our office at her next visit and we can go over how to use it with her.  She is also open to a continuous glucose monitor.  I do not see any contraindication to starting Jardiance.  Searching through her chart, I do not see any previous mention of any of the 3 SGLT2 inhibitors.  Has CKD but most recent BMP was with GFR >45.  Does not have a significant history of UTIs.  Cost may be an issue but will not know until we send in prescription.  Will send in prescription for Jardiance to help improve her diabetic control but also for her heart failure.

## 2022-12-14 NOTE — Assessment & Plan Note (Signed)
Continue current inhalers.  Counseled on smoking cessation.  Referral to pulmonology sent in.

## 2022-12-14 NOTE — Assessment & Plan Note (Signed)
Will have pt follow up in one month to discuss long term pain mgmt.  Refill flexeril and gabapentin today.

## 2022-12-16 ENCOUNTER — Telehealth: Payer: Self-pay

## 2022-12-16 MED ORDER — EMPAGLIFLOZIN 10 MG PO TABS: 10.0000 mg | ORAL_TABLET | Freq: Every day | ORAL | 1 refills | Status: DC

## 2022-12-16 NOTE — Telephone Encounter (Signed)
-----   Message from Kaitlin Cantrell sent at 12/16/2022  7:08 AM EDT ----- Please let the patient know that I sent in a prescription for Jardiance to help with her diabetes and heart failure.  This was the medication we discussed in her appointment.  Have her let us know if it is too expensive and we can see if there are coupons available.

## 2022-12-16 NOTE — Telephone Encounter (Signed)
LVM requesting a return call.

## 2023-01-02 ENCOUNTER — Inpatient Hospital Stay (HOSPITAL_COMMUNITY)
Admission: EM | Admit: 2023-01-02 | Discharge: 2023-01-08 | DRG: 177 | Disposition: A | Discharge status: Home / Self Care | Payer: PPO | Attending: Internal Medicine | Admitting: Internal Medicine

## 2023-01-02 ENCOUNTER — Other Ambulatory Visit: Payer: Self-pay

## 2023-01-02 ENCOUNTER — Emergency Department (HOSPITAL_COMMUNITY): Payer: PPO

## 2023-01-02 ENCOUNTER — Encounter (HOSPITAL_COMMUNITY): Payer: Self-pay | Admitting: Internal Medicine

## 2023-01-02 DIAGNOSIS — E860 Dehydration: Secondary | ICD-10-CM | POA: Diagnosis not present

## 2023-01-02 DIAGNOSIS — Z888 Allergy status to other drugs, medicaments and biological substances status: Secondary | ICD-10-CM

## 2023-01-02 DIAGNOSIS — Z79899 Other long term (current) drug therapy: Secondary | ICD-10-CM

## 2023-01-02 DIAGNOSIS — G9341 Metabolic encephalopathy: Secondary | ICD-10-CM | POA: Diagnosis not present

## 2023-01-02 DIAGNOSIS — Z885 Allergy status to narcotic agent status: Secondary | ICD-10-CM

## 2023-01-02 DIAGNOSIS — E785 Hyperlipidemia, unspecified: Secondary | ICD-10-CM | POA: Diagnosis present

## 2023-01-02 DIAGNOSIS — J1282 Pneumonia due to coronavirus disease 2019: Secondary | ICD-10-CM

## 2023-01-02 DIAGNOSIS — E11649 Type 2 diabetes mellitus with hypoglycemia without coma: Secondary | ICD-10-CM | POA: Diagnosis not present

## 2023-01-02 DIAGNOSIS — E1169 Type 2 diabetes mellitus with other specified complication: Secondary | ICD-10-CM | POA: Diagnosis not present

## 2023-01-02 DIAGNOSIS — F17210 Nicotine dependence, cigarettes, uncomplicated: Secondary | ICD-10-CM | POA: Diagnosis present

## 2023-01-02 DIAGNOSIS — R0902 Hypoxemia: Secondary | ICD-10-CM | POA: Diagnosis not present

## 2023-01-02 DIAGNOSIS — E161 Other hypoglycemia: Secondary | ICD-10-CM | POA: Diagnosis not present

## 2023-01-02 DIAGNOSIS — Z833 Family history of diabetes mellitus: Secondary | ICD-10-CM

## 2023-01-02 DIAGNOSIS — Z83719 Family history of colon polyps, unspecified: Secondary | ICD-10-CM

## 2023-01-02 DIAGNOSIS — I480 Paroxysmal atrial fibrillation: Secondary | ICD-10-CM | POA: Diagnosis present

## 2023-01-02 DIAGNOSIS — I959 Hypotension, unspecified: Secondary | ICD-10-CM | POA: Diagnosis present

## 2023-01-02 DIAGNOSIS — I5032 Chronic diastolic (congestive) heart failure: Secondary | ICD-10-CM | POA: Diagnosis present

## 2023-01-02 DIAGNOSIS — R059 Cough, unspecified: Secondary | ICD-10-CM | POA: Diagnosis not present

## 2023-01-02 DIAGNOSIS — I428 Other cardiomyopathies: Secondary | ICD-10-CM | POA: Diagnosis not present

## 2023-01-02 DIAGNOSIS — J441 Chronic obstructive pulmonary disease with (acute) exacerbation: Secondary | ICD-10-CM | POA: Diagnosis not present

## 2023-01-02 DIAGNOSIS — R509 Fever, unspecified: Secondary | ICD-10-CM | POA: Diagnosis not present

## 2023-01-02 DIAGNOSIS — Z7901 Long term (current) use of anticoagulants: Secondary | ICD-10-CM

## 2023-01-02 DIAGNOSIS — I152 Hypertension secondary to endocrine disorders: Secondary | ICD-10-CM | POA: Diagnosis present

## 2023-01-02 DIAGNOSIS — G894 Chronic pain syndrome: Secondary | ICD-10-CM | POA: Diagnosis present

## 2023-01-02 DIAGNOSIS — Z6841 Body Mass Index (BMI) 40.0 and over, adult: Secondary | ICD-10-CM | POA: Diagnosis not present

## 2023-01-02 DIAGNOSIS — U071 COVID-19: Principal | ICD-10-CM | POA: Diagnosis present

## 2023-01-02 DIAGNOSIS — Z8616 Personal history of COVID-19: Secondary | ICD-10-CM

## 2023-01-02 DIAGNOSIS — Z981 Arthrodesis status: Secondary | ICD-10-CM

## 2023-01-02 DIAGNOSIS — E876 Hypokalemia: Secondary | ICD-10-CM | POA: Diagnosis not present

## 2023-01-02 DIAGNOSIS — I130 Hypertensive heart and chronic kidney disease with heart failure and stage 1 through stage 4 chronic kidney disease, or unspecified chronic kidney disease: Secondary | ICD-10-CM | POA: Diagnosis present

## 2023-01-02 DIAGNOSIS — Z8249 Family history of ischemic heart disease and other diseases of the circulatory system: Secondary | ICD-10-CM

## 2023-01-02 DIAGNOSIS — Z96652 Presence of left artificial knee joint: Secondary | ICD-10-CM | POA: Diagnosis present

## 2023-01-02 DIAGNOSIS — Z825 Family history of asthma and other chronic lower respiratory diseases: Secondary | ICD-10-CM

## 2023-01-02 DIAGNOSIS — Z794 Long term (current) use of insulin: Secondary | ICD-10-CM

## 2023-01-02 DIAGNOSIS — E871 Hypo-osmolality and hyponatremia: Secondary | ICD-10-CM | POA: Diagnosis present

## 2023-01-02 DIAGNOSIS — J440 Chronic obstructive pulmonary disease with acute lower respiratory infection: Secondary | ICD-10-CM | POA: Diagnosis present

## 2023-01-02 DIAGNOSIS — E875 Hyperkalemia: Secondary | ICD-10-CM | POA: Insufficient documentation

## 2023-01-02 DIAGNOSIS — Z7951 Long term (current) use of inhaled steroids: Secondary | ICD-10-CM

## 2023-01-02 DIAGNOSIS — E1122 Type 2 diabetes mellitus with diabetic chronic kidney disease: Secondary | ICD-10-CM | POA: Diagnosis not present

## 2023-01-02 DIAGNOSIS — E1159 Type 2 diabetes mellitus with other circulatory complications: Secondary | ICD-10-CM | POA: Diagnosis present

## 2023-01-02 DIAGNOSIS — R4182 Altered mental status, unspecified: Secondary | ICD-10-CM | POA: Diagnosis not present

## 2023-01-02 DIAGNOSIS — Z7984 Long term (current) use of oral hypoglycemic drugs: Secondary | ICD-10-CM

## 2023-01-02 DIAGNOSIS — R7401 Elevation of levels of liver transaminase levels: Secondary | ICD-10-CM | POA: Diagnosis present

## 2023-01-02 DIAGNOSIS — I4892 Unspecified atrial flutter: Secondary | ICD-10-CM | POA: Diagnosis not present

## 2023-01-02 DIAGNOSIS — J449 Chronic obstructive pulmonary disease, unspecified: Secondary | ICD-10-CM | POA: Diagnosis present

## 2023-01-02 DIAGNOSIS — N183 Chronic kidney disease, stage 3 unspecified: Secondary | ICD-10-CM | POA: Diagnosis present

## 2023-01-02 DIAGNOSIS — Z86711 Personal history of pulmonary embolism: Secondary | ICD-10-CM

## 2023-01-02 DIAGNOSIS — N179 Acute kidney failure, unspecified: Secondary | ICD-10-CM | POA: Diagnosis not present

## 2023-01-02 DIAGNOSIS — J189 Pneumonia, unspecified organism: Secondary | ICD-10-CM | POA: Diagnosis not present

## 2023-01-02 DIAGNOSIS — N1832 Chronic kidney disease, stage 3b: Secondary | ICD-10-CM | POA: Insufficient documentation

## 2023-01-02 DIAGNOSIS — K219 Gastro-esophageal reflux disease without esophagitis: Secondary | ICD-10-CM | POA: Diagnosis present

## 2023-01-02 DIAGNOSIS — Z8701 Personal history of pneumonia (recurrent): Secondary | ICD-10-CM

## 2023-01-02 DIAGNOSIS — E162 Hypoglycemia, unspecified: Secondary | ICD-10-CM | POA: Diagnosis not present

## 2023-01-02 DIAGNOSIS — Z809 Family history of malignant neoplasm, unspecified: Secondary | ICD-10-CM

## 2023-01-02 DIAGNOSIS — R918 Other nonspecific abnormal finding of lung field: Secondary | ICD-10-CM | POA: Diagnosis not present

## 2023-01-02 LAB — CBC WITH DIFFERENTIAL/PLATELET
Abs Immature Granulocytes: 0.07 10*3/uL (ref 0.00–0.07)
Basophils Absolute: 0.1 10*3/uL (ref 0.0–0.1)
Basophils Relative: 0 %
Eosinophils Absolute: 0 10*3/uL (ref 0.0–0.5)
Eosinophils Relative: 0 %
HCT: 34.6 % — ABNORMAL LOW (ref 36.0–46.0)
Hemoglobin: 10.4 g/dL — ABNORMAL LOW (ref 12.0–15.0)
Immature Granulocytes: 1 %
Lymphocytes Relative: 10 %
Lymphs Abs: 1.5 10*3/uL (ref 0.7–4.0)
MCH: 25.3 pg — ABNORMAL LOW (ref 26.0–34.0)
MCHC: 30.1 g/dL (ref 30.0–36.0)
MCV: 84.2 fL (ref 80.0–100.0)
Monocytes Absolute: 1.1 10*3/uL — ABNORMAL HIGH (ref 0.1–1.0)
Monocytes Relative: 8 %
Neutro Abs: 11.4 10*3/uL — ABNORMAL HIGH (ref 1.7–7.7)
Neutrophils Relative %: 81 %
Platelets: 401 10*3/uL — ABNORMAL HIGH (ref 150–400)
RBC: 4.11 MIL/uL (ref 3.87–5.11)
RDW: 15 % (ref 11.5–15.5)
WBC: 14.1 10*3/uL — ABNORMAL HIGH (ref 4.0–10.5)
nRBC: 0 % (ref 0.0–0.2)

## 2023-01-02 LAB — COMPREHENSIVE METABOLIC PANEL
ALT: 76 U/L — ABNORMAL HIGH (ref 0–44)
AST: 138 U/L — ABNORMAL HIGH (ref 15–41)
Albumin: 2.6 g/dL — ABNORMAL LOW (ref 3.5–5.0)
Alkaline Phosphatase: 84 U/L (ref 38–126)
Anion gap: 16 — ABNORMAL HIGH (ref 5–15)
BUN: 51 mg/dL — ABNORMAL HIGH (ref 8–23)
CO2: 21 mmol/L — ABNORMAL LOW (ref 22–32)
Calcium: 8.5 mg/dL — ABNORMAL LOW (ref 8.9–10.3)
Chloride: 85 mmol/L — ABNORMAL LOW (ref 98–111)
Creatinine, Ser: 2.75 mg/dL — ABNORMAL HIGH (ref 0.44–1.00)
GFR, Estimated: 19 mL/min — ABNORMAL LOW (ref >60)
Glucose, Bld: 120 mg/dL — ABNORMAL HIGH (ref 70–99)
Potassium: 4.7 mmol/L (ref 3.5–5.1)
Sodium: 122 mmol/L — ABNORMAL LOW (ref 135–145)
Total Bilirubin: 0.9 mg/dL (ref 0.3–1.2)
Total Protein: 6.5 g/dL (ref 6.5–8.1)

## 2023-01-02 LAB — SARS CORONAVIRUS 2 BY RT PCR: SARS Coronavirus 2 by RT PCR: POSITIVE — AB

## 2023-01-02 LAB — BASIC METABOLIC PANEL
Anion gap: 14 (ref 5–15)
BUN: 51 mg/dL — ABNORMAL HIGH (ref 8–23)
CO2: 24 mmol/L (ref 22–32)
Calcium: 8.6 mg/dL — ABNORMAL LOW (ref 8.9–10.3)
Chloride: 89 mmol/L — ABNORMAL LOW (ref 98–111)
Creatinine, Ser: 2.66 mg/dL — ABNORMAL HIGH (ref 0.44–1.00)
GFR, Estimated: 19 mL/min — ABNORMAL LOW (ref >60)
Glucose, Bld: 127 mg/dL — ABNORMAL HIGH (ref 70–99)
Potassium: 5.2 mmol/L — ABNORMAL HIGH (ref 3.5–5.1)
Sodium: 127 mmol/L — ABNORMAL LOW (ref 135–145)

## 2023-01-02 LAB — GLUCOSE, CAPILLARY: Glucose-Capillary: 111 mg/dL — ABNORMAL HIGH (ref 70–99)

## 2023-01-02 LAB — CBG MONITORING, ED: Glucose-Capillary: 99 mg/dL (ref 70–99)

## 2023-01-02 LAB — OSMOLALITY: Osmolality: 289 mosm/kg (ref 275–295)

## 2023-01-02 LAB — HIV ANTIBODY (ROUTINE TESTING W REFLEX): HIV Screen 4th Generation wRfx: NONREACTIVE

## 2023-01-02 MED ORDER — MAGNESIUM SULFATE 2 GM/50ML IV SOLN
2.0000 g | Freq: Once | INTRAVENOUS | Status: AC
  Administered 2023-01-02: 2 g via INTRAVENOUS
  Filled 2023-01-02: qty 50

## 2023-01-02 MED ORDER — SODIUM CHLORIDE 0.9 % IV SOLN
500.0000 mg | Freq: Once | INTRAVENOUS | Status: AC
  Administered 2023-01-02: 500 mg via INTRAVENOUS
  Filled 2023-01-02: qty 5

## 2023-01-02 MED ORDER — DEXAMETHASONE SODIUM PHOSPHATE 10 MG/ML IJ SOLN
6.0000 mg | INTRAMUSCULAR | Status: DC
  Administered 2023-01-03 – 2023-01-07 (×5): 6 mg via INTRAVENOUS
  Filled 2023-01-02 (×6): qty 0.6

## 2023-01-02 MED ORDER — LACTATED RINGERS IV BOLUS
1000.0000 mL | Freq: Once | INTRAVENOUS | Status: AC
  Administered 2023-01-02: 1000 mL via INTRAVENOUS

## 2023-01-02 MED ORDER — SODIUM CHLORIDE 0.9 % IV SOLN
1.0000 g | Freq: Once | INTRAVENOUS | Status: AC
  Administered 2023-01-02: 1 g via INTRAVENOUS
  Filled 2023-01-02: qty 10

## 2023-01-02 MED ORDER — MENTHOL 3 MG MT LOZG
1.0000 | LOZENGE | OROMUCOSAL | Status: DC | PRN
  Filled 2023-01-02: qty 9

## 2023-01-02 MED ORDER — SODIUM CHLORIDE 0.9 % IV SOLN
100.0000 mg | Freq: Every day | INTRAVENOUS | Status: AC
  Administered 2023-01-04 (×2): 100 mg via INTRAVENOUS
  Filled 2023-01-02 (×3): qty 20

## 2023-01-02 MED ORDER — SODIUM CHLORIDE 0.9 % IV SOLN: INTRAVENOUS | Status: AC

## 2023-01-02 MED ORDER — SODIUM CHLORIDE 0.9 % IV SOLN
200.0000 mg | Freq: Once | INTRAVENOUS | Status: AC
  Administered 2023-01-02: 200 mg via INTRAVENOUS
  Filled 2023-01-02: qty 40

## 2023-01-02 MED ORDER — DM-GUAIFENESIN ER 30-600 MG PO TB12
1.0000 | ORAL_TABLET | Freq: Two times a day (BID) | ORAL | Status: DC
  Administered 2023-01-02 – 2023-01-08 (×12): 1 via ORAL
  Filled 2023-01-02 (×13): qty 1

## 2023-01-02 MED ORDER — DEXAMETHASONE SODIUM PHOSPHATE 10 MG/ML IJ SOLN
6.0000 mg | Freq: Once | INTRAMUSCULAR | Status: AC
  Administered 2023-01-02: 6 mg via INTRAVENOUS
  Filled 2023-01-02: qty 1

## 2023-01-02 NOTE — ED Notes (Signed)
Kaitlin TO INPATIENT HANDOFF REPORT  Kaitlin Nurse Name and Phone #:  [place] [phone]  S Name/Age/Gender Kaitlin Cantrell 67 y.o. female Room/Bed: 022C/022C  Code Status   Code Status: Prior  Home/SNF/Other Home Patient oriented to: self, place, time, and situation Is this baseline? Yes   Triage Complete: Triage complete  Chief Complaint Pneumonia due to COVID-19 virus [U07.1, J12.82]  Triage Note Patient BIB EMS today from home. Patients family reported that she was unresponsive and not answering them. Blood sugar when EMS arrived was 49/ History of PNA. Patient is a diabetic but does takes Lantus at night and Metfromin. After 25g of D10 blood sugar was 118 per EMS.   Rhonic noted in all 4 lung fields.   Vitals  97% (RA) 66 HR 160/86   Alert and aware x4    Allergies Allergies  Allergen Reactions   Albuterol Anaphylaxis    Throat/lips swelling   Morphine And Codeine Other (See Comments)    Very aggressive and doesn't help pain    Level of Care/Admitting Diagnosis Kaitlin Disposition     Kaitlin Disposition  Admit   Condition  --   Comment  Hospital [facility]: [facility] [[id number]]  Level of Care: Progressive [102]  Admit to Progressive based on following criteria: RESPIRATORY PROBLEMS hypoxemic/hypercapnic respiratory failure that is responsive to NIPPV (BiPAP) or High Flow Nasal Cannula (6-80 lpm). Frequent assessment/intervention, no > Q2 hrs < Q4 hrs, to maintain oxygenation and pulmonary hygiene.  Admit to Progressive based on following criteria: MULTISYSTEM THREATS such as stable sepsis, metabolic/electrolyte imbalance with or without encephalopathy that is responding to early treatment.  May admit patient to [facility] or [facility] if equivalent level of care is available:: Yes  Covid Evaluation: Asymptomatic - no recent exposure (last 10 days) testing not required  Diagnosis: Pneumonia due to COVID-19 virus [[id number]]  Admitting Physician: John Giovanni [[id number]]  Attending Physician: John Giovanni [[id number]]  Certification:: I certify this patient will need inpatient services for at least 2 midnights  Expected Medical Readiness: 01/04/2023          B Medical/Surgery History Past Medical History:  Diagnosis Date   Allergy    seasonal   Arthritis    Diabetes mellitus    GERD (gastroesophageal reflux disease)    History of pneumonia    15 years ago   Hyperlipidemia    Hypertension    PE (pulmonary embolism) 12/2015   Past Surgical History:  Procedure Laterality Date   ANKLE FRACTURE SURGERY  1974   left and pinned then pins removed   ANKLE GANGLION CYST EXCISION  1994   right   BACK SURGERY  1998   discectomy and then fusion   CARDIAC CATHETERIZATION N/A 04/05/2016   Procedure: Right/Left Heart Cath and Coronary Angiography;  Surgeon: Laurey Morale, MD;  Location: [facility] INVASIVE CV LAB;  Service: Cardiovascular;  Laterality: N/A;   CARDIOVERSION N/A 01/02/2020   Procedure: CARDIOVERSION;  Surgeon: Laurey Morale, MD;  Location: [facility] ENDOSCOPY;  Service: Cardiovascular;  Laterality: N/A;   KNEE ARTHROPLASTY  02/14/2012   Procedure: COMPUTER ASSISTED TOTAL KNEE ARTHROPLASTY;  Surgeon: Cammy Copa, MD;  Location: [facility] OR;  Service: Orthopedics;  Laterality: Left;  Left total knee arthroplasty   TEE WITHOUT CARDIOVERSION N/A 01/02/2020   Procedure: TRANSESOPHAGEAL ECHOCARDIOGRAM (TEE);  Surgeon: Laurey Morale, MD;  Location: [facility] ENDOSCOPY;  Service: Cardiovascular;  Laterality: N/A;   TONSILLECTOMY  1972   TUBAL LIGATION  1996  UMBILICAL HERNIA REPAIR  1996     A IV Location/Drains/Wounds Patient Lines/Drains/Airways Status     Active Line/Drains/Airways     Name Placement date Placement time Site Days   Peripheral IV 01/02/23 18 G Left Antecubital 01/02/23  1701  Antecubital  less than 1            Intake/Output Last 24 hours No intake or output data in the 24 hours ending 01/02/23  2002  Labs/Imaging Results for orders placed or performed during the hospital encounter of 01/02/23 (from the past 48 hour(s))  CBG monitoring, Kaitlin     Status: None   Collection Time: 01/02/23  5:12 PM  Result Value Ref Range   Glucose-Capillary 99 70 - 99 mg/dL    Comment: Glucose reference range applies only to samples taken after fasting for at least 8 hours.  CBC with Differential     Status: Abnormal   Collection Time: 01/02/23  5:18 PM  Result Value Ref Range   WBC 14.1 (H) 4.0 - 10.5 K/uL   RBC 4.11 3.87 - 5.11 MIL/uL   Hemoglobin 10.4 (L) 12.0 - 15.0 g/dL   HCT 29.5 (L) 62.1 - 30.8 %   MCV 84.2 80.0 - 100.0 fL   MCH 25.3 (L) 26.0 - 34.0 pg   MCHC 30.1 30.0 - 36.0 g/dL   RDW 65.7 84.6 - 96.2 %   Platelets 401 (H) 150 - 400 K/uL   nRBC 0.0 0.0 - 0.2 %   Neutrophils Relative % 81 %   Neutro Abs 11.4 (H) 1.7 - 7.7 K/uL   Lymphocytes Relative 10 %   Lymphs Abs 1.5 0.7 - 4.0 K/uL   Monocytes Relative 8 %   Monocytes Absolute 1.1 (H) 0.1 - 1.0 K/uL   Eosinophils Relative 0 %   Eosinophils Absolute 0.0 0.0 - 0.5 K/uL   Basophils Relative 0 %   Basophils Absolute 0.1 0.0 - 0.1 K/uL   Immature Granulocytes 1 %   Abs Immature Granulocytes 0.07 0.00 - 0.07 K/uL    Comment: Performed at [facility], [address], [place], [place] [address]  Comprehensive metabolic panel     Status: Abnormal   Collection Time: 01/02/23  5:18 PM  Result Value Ref Range   Sodium 122 (L) 135 - 145 mmol/L   Potassium 4.7 3.5 - 5.1 mmol/L   Chloride 85 (L) 98 - 111 mmol/L   CO2 21 (L) 22 - 32 mmol/L   Glucose, Bld 120 (H) 70 - 99 mg/dL    Comment: Glucose reference range applies only to samples taken after fasting for at least 8 hours.   BUN 51 (H) 8 - 23 mg/dL   Creatinine, Ser 1.32 (H) 0.44 - 1.00 mg/dL   Calcium 8.5 (L) 8.9 - 10.3 mg/dL   Total Protein 6.5 6.5 - 8.1 g/dL   Albumin 2.6 (L) 3.5 - 5.0 g/dL   AST 440 (H) 15 - 41 U/L   ALT 76 (H) 0 - 44 U/L   Alkaline Phosphatase 84 38 -  126 U/L   Total Bilirubin 0.9 0.3 - 1.2 mg/dL   GFR, Estimated 19 (L) >60 mL/min    Comment: (NOTE) Calculated using the CKD-EPI Creatinine Equation (2021)    Anion gap 16 (H) 5 - 15    Comment: Performed at [facility], [address] [place], [place] [address]  SARS Coronavirus 2 by RT PCR (hospital order, performed in [facility] hospital lab) *cepheid single  result test* Anterior Nasal Swab     Status: Abnormal   Collection Time: 01/02/23  6:10 PM   Specimen: Anterior Nasal Swab  Result Value Ref Range   SARS Coronavirus 2 by RT PCR POSITIVE (A) NEGATIVE    Comment: Performed at [facility], [address] [place], [place] [address]   DG Chest Portable 1 View  Result Date: 01/02/2023 CLINICAL DATA:  Cough and fever. EXAM: PORTABLE CHEST 1 VIEW COMPARISON:  01/16/2020, chest CT 02/28/2022 FINDINGS: Upper normal heart size. Diffuse interstitial coarsening with patchy opacity at the bases. No pneumothorax or significant pleural effusion. No acute osseous findings. IMPRESSION: Diffuse interstitial coarsening with patchy opacity at the bases, suspicious for multifocal pneumonia, including atypical viral infection. Less likely consideration pulmonary edema. Electronically Signed   By: Narda Rutherford M.D.   On: 01/02/2023 18:55    Pending Labs Unresulted Labs (From admission, onward)     Start     Ordered   01/02/23 1906  Blood culture (routine x 2)  BLOOD CULTURE X 2,   R (with STAT occurrences)      01/02/23 1905            Vitals/Pain Today's Vitals   01/02/23 1659 01/02/23 1700 01/02/23 1730 01/02/23 1816  BP:  (!) 121/51 136/63   Pulse:  63 67   Resp:  15 15   Temp:    97.6 F (36.4 C)  TempSrc:    Oral  SpO2:  100% 99%   Weight: 115.2 kg     Height: 5\' 6"  (1.676 m)     PainSc:        Isolation Precautions Airborne and Contact precautions  Medications Medications  azithromycin (ZITHROMAX) 500 mg in sodium chloride 0.9 % 250 mL IVPB (500 mg  Intravenous New Bag/Given 01/02/23 1948)  cefTRIAXone (ROCEPHIN) 1 g in sodium chloride 0.9 % 100 mL IVPB (has no administration in time range)  magnesium sulfate IVPB 2 g 50 mL (0 g Intravenous Stopped 01/02/23 1901)  lactated ringers bolus 1,000 mL (1,000 mLs Intravenous New Bag/Given 01/02/23 1947)  dexamethasone (DECADRON) injection 6 mg (6 mg Intravenous Given 01/02/23 1944)    Mobility walks with person assist         R Recommendations: See Admitting Provider Note  Report given to:   Additional Notes:  Pt I sA&Ox4, continent x2, ambulatory with x1 person assist. Husband at bedside.

## 2023-01-02 NOTE — H&P (Signed)
History and Physical    Kaitlin Cantrell XBJ:[id number] DOB: 1956/01/17 DOA: 01/02/2023  PCP: [name], MD  Patient coming from: Home  Chief Complaint: AMS  HPI: Kaitlin Cantrell is a 67 y.o. female with medical history significant of insulin-dependent type 2 diabetes, GERD, hyperlipidemia, hypertension, atrial flutter on Xarelto, history of PE in 2017, cigarette smoking, COPD, CHF/NICM, COVID infection in 2021, chronic pain syndrome, CKD stage IIIb presented to the ED via EMS for evaluation of altered mental status.  Patient was found unresponsive by family and when EMS arrived her blood glucose was 49.  She was given 25 g of D10 with improvement of glucose to 118 and she immediately became conscious.  Satting 97% on room air with EMS.  Patient AAOx4 on arrival to the ED.  She endorsed shortness of breath and cough.  Afebrile.  Labs notable for WBC 14.1, hemoglobin 10.4 (at baseline), sodium 122 (previously 136-140 on labs done in May 2024), chloride 85, bicarb 21, glucose 120, BUN 51, creatinine 2.7 (baseline 1.2-1.4), AST 138, ALT 76, alk phos and T. bili normal, SARS-CoV-2 PCR positive, blood cultures ordered.  Chest x-ray showing diffuse interstitial coarsening with patchy opacities at the bases suspicious for multifocal pneumonia/atypical viral infection and pulmonary edema felt to be less likely. Patient was not hypoxic but had rhonchi on exam.  She is allergic to albuterol (history of anaphylaxis).  She was given IV Decadron 6 mg and IV mag 2 g.  Patient also received ceftriaxone, azithromycin, and 1 L LR in the ED.  TRH called to admit.  Patient states she has been feeling ill for the past 1 week.  Having cough, shortness of breath, fatigue, and poor p.o. intake.  She takes Lantus 25 units daily along with metformin and glimepiride for her diabetes.  She has continued to take her diabetes medications.  Denies chest pain.  Denies abdominal pain, nausea, or vomiting.  Denies any urinary  symptoms.  She is compliant with taking Xarelto.  Review of Systems:  Review of Systems  All other systems reviewed and are negative.   Past Medical History:  Diagnosis Date   Allergy    seasonal   Arthritis    Diabetes mellitus    GERD (gastroesophageal reflux disease)    History of pneumonia    15 years ago   Hyperlipidemia    Hypertension    PE (pulmonary embolism) 12/2015    Past Surgical History:  Procedure Laterality Date   ANKLE FRACTURE SURGERY  1974   left and pinned then pins removed   ANKLE GANGLION CYST EXCISION  1994   right   BACK SURGERY  1998   discectomy and then fusion   CARDIAC CATHETERIZATION N/A 04/05/2016   Procedure: Right/Left Heart Cath and Coronary Angiography;  Surgeon: Laurey Morale, MD;  Location: [facility] INVASIVE CV LAB;  Service: Cardiovascular;  Laterality: N/A;   CARDIOVERSION N/A 01/02/2020   Procedure: CARDIOVERSION;  Surgeon: Laurey Morale, MD;  Location: [facility] ENDOSCOPY;  Service: Cardiovascular;  Laterality: N/A;   KNEE ARTHROPLASTY  02/14/2012   Procedure: COMPUTER ASSISTED TOTAL KNEE ARTHROPLASTY;  Surgeon: Cammy Copa, MD;  Location: [facility] OR;  Service: Orthopedics;  Laterality: Left;  Left total knee arthroplasty   TEE WITHOUT CARDIOVERSION N/A 01/02/2020   Procedure: TRANSESOPHAGEAL ECHOCARDIOGRAM (TEE);  Surgeon: Laurey Morale, MD;  Location: [facility] ENDOSCOPY;  Service: Cardiovascular;  Laterality: N/A;   TONSILLECTOMY  1972   TUBAL LIGATION  1996   UMBILICAL HERNIA  REPAIR  1996     reports that she has been smoking cigarettes. She has a 45 pack-year smoking history. She has been exposed to tobacco smoke. She has never used smokeless tobacco. She reports current alcohol use. She reports that she does not use drugs.  Allergies  Allergen Reactions   Albuterol Anaphylaxis    Throat/lips swelling   Morphine And Codeine Other (See Comments)    Very aggressive and doesn't help pain    Family History  Problem Relation Age of Onset    Colon polyps Father    Diabetes Father        mother   Heart disease Father    Heart murmur Mother    Diabetes Mother    Hypertension Mother    Diabetes Brother    Cancer Maternal Grandmother    Diabetes Paternal Grandmother    Heart disease Paternal Grandmother    COPD Paternal Grandfather    Heart disease Paternal Grandfather    Hypertension Brother    Hypertension Brother     Prior to Admission medications   Medication Sig Start Date End Date Taking? Authorizing Provider  atorvastatin (LIPITOR) 80 MG tablet TAKE 1 TABLET BY MOUTH ONCE DAILY AT  6PM 04/04/22   Carlean Jews, NP  blood glucose meter kit and supplies KIT Dispense based on patient and insurance preference. Use up to four times daily as directed. 12/02/21   Carlean Jews, NP  budesonide (PULMICORT) 0.25 MG/2ML nebulizer solution Take 4 mLs (0.5 mg total) by nebulization daily. 08/04/22   Carlean Jews, NP  budesonide-formoterol (SYMBICORT) 160-4.5 MCG/ACT inhaler Inhale 1 puff into the lungs 2 (two) times daily. 05/24/22   Carlean Jews, NP  cyclobenzaprine (FLEXERIL) 10 MG tablet Take 1 tablet (10 mg total) by mouth 3 (three) times daily as needed. for muscle spams 12/13/22   [name], MD  empagliflozin (JARDIANCE) 10 MG TABS tablet Take 1 tablet (10 mg total) by mouth daily before breakfast. 12/16/22   [name], MD  ergocalciferol (DRISDOL) 1.25 MG (50000 UT) capsule Take 1 capsule (50,000 Units total) by mouth once a week. 08/04/22   Carlean Jews, NP  furosemide (LASIX) 40 MG tablet Take 1 tablet (40 mg total) by mouth daily. 08/04/22   Carlean Jews, NP  gabapentin (NEURONTIN) 400 MG capsule Take 1 capsule (400 mg total) by mouth 3 (three) times daily. 12/13/22   [name], MD  glimepiride (AMARYL) 4 MG tablet Take 1 tablet (4 mg total) by mouth 2 (two) times daily. 04/04/22   Carlean Jews, NP  insulin glargine (LANTUS SOLOSTAR) 100 UNIT/ML Solostar Pen Inject 25 Units into the  skin daily. 08/04/22   Carlean Jews, NP  Insulin Pen Needle (PEN NEEDLES) 32G X 4 MM MISC 1 Units by Does not apply route daily. 10/31/17   Sherren Mocha, MD  ipratropium (ATROVENT HFA) 17 MCG/ACT inhaler Inhale 2 puffs into the lungs every 6 (six) hours as needed for wheezing. 08/04/22   Carlean Jews, NP  ipratropium (ATROVENT) 0.02 % nebulizer solution Take 2.5 mLs (0.5 mg total) by nebulization every 6 (six) hours as needed for wheezing or shortness of breath. 03/07/22   Carlean Jews, NP  [facility] CAPS Take 1 capsule by mouth daily.     [provider]  metFORMIN (GLUCOPHAGE) 850 MG tablet Take 1 tablet (850 mg total) by mouth 2 (two) times daily with a meal. 08/04/22  Carlean Jews, NP  metoprolol (TOPROL-XL) 200 MG 24 hr tablet Take 1 tablet (200 mg total) by mouth daily. 08/04/22   Carlean Jews, NP  rivaroxaban (XARELTO) 20 MG TABS tablet Take 1 tablet (20 mg total) by mouth daily. 08/04/22   Carlean Jews, NP  sacubitril-valsartan (ENTRESTO) 97-103 MG Take 1 tablet by mouth 2 (two) times daily. 09/07/22   Alen Bleacher, NP    Physical Exam: Vitals:   01/02/23 1659 01/02/23 1700 01/02/23 1730 01/02/23 1816  BP:  (!) 121/51 136/63   Pulse:  63 67   Resp:  15 15   Temp:    97.6 F (36.4 C)  TempSrc:    Oral  SpO2:  100% 99%   Weight: 115.2 kg     Height: 5\' 6"  (1.676 m)       Physical Exam Vitals reviewed.  Constitutional:      General: She is not in acute distress. HENT:     Head: Normocephalic and atraumatic.     Mouth/Throat:     Mouth: Mucous membranes are dry.  Eyes:     Extraocular Movements: Extraocular movements intact.  Cardiovascular:     Rate and Rhythm: Normal rate and regular rhythm.     Pulses: Normal pulses.  Pulmonary:     Effort: Pulmonary effort is normal. No respiratory distress.     Breath sounds: Rhonchi present. No rales.     Comments: Diffuse rhonchi Abdominal:     General: Bowel sounds are normal. There is no distension.      Palpations: Abdomen is soft.     Tenderness: There is no abdominal tenderness.  Musculoskeletal:     Cervical back: Normal range of motion.     Comments: Bilateral pedal edema  Skin:    General: Skin is warm and dry.  Neurological:     General: No focal deficit present.     Mental Status: She is alert and oriented to person, place, and time.     Cranial Nerves: No cranial nerve deficit.     Sensory: No sensory deficit.     Motor: No weakness.     Labs on Admission: I have personally reviewed following labs and imaging studies  CBC: Recent Labs  Lab 01/02/23 1718  WBC 14.1*  NEUTROABS 11.4*  HGB 10.4*  HCT 34.6*  MCV 84.2  PLT 401*   Basic Metabolic Panel: Recent Labs  Lab 01/02/23 1718  NA 122*  K 4.7  CL 85*  CO2 21*  GLUCOSE 120*  BUN 51*  CREATININE 2.75*  CALCIUM 8.5*   GFR: Estimated Creatinine Clearance: 26.3 mL/min (A) (by C-G formula based on SCr of 2.75 mg/dL (H)). Liver Function Tests: Recent Labs  Lab 01/02/23 1718  AST 138*  ALT 76*  ALKPHOS 84  BILITOT 0.9  PROT 6.5  ALBUMIN 2.6*   No results for input(s): "LIPASE", "AMYLASE" in the last 168 hours. No results for input(s): "AMMONIA" in the last 168 hours. Coagulation Profile: No results for input(s): "INR", "PROTIME" in the last 168 hours. Cardiac Enzymes: No results for input(s): "CKTOTAL", "CKMB", "CKMBINDEX", "TROPONINI" in the last 168 hours. BNP (last 3 results) No results for input(s): "PROBNP" in the last 8760 hours. HbA1C: No results for input(s): "HGBA1C" in the last 72 hours. CBG: Recent Labs  Lab 01/02/23 1712  GLUCAP 99   Lipid Profile: No results for input(s): "CHOL", "HDL", "LDLCALC", "TRIG", "CHOLHDL", "LDLDIRECT" in the last 72 hours. Thyroid Function Tests: No results  for input(s): "TSH", "T4TOTAL", "FREET4", "T3FREE", "THYROIDAB" in the last 72 hours. Anemia Panel: No results for input(s): "VITAMINB12", "FOLATE", "FERRITIN", "TIBC", "IRON", "RETICCTPCT" in the  last 72 hours. Urine analysis:    Component Value Date/Time   COLORURINE YELLOW 12/29/2015 1252   APPEARANCEUR CLEAR 12/29/2015 1252   LABSPEC 1.029 12/29/2015 1252   PHURINE 5.0 12/29/2015 1252   GLUCOSEU NEGATIVE 12/29/2015 1252   HGBUR NEGATIVE 12/29/2015 1252   BILIRUBINUR NEGATIVE 12/29/2015 1252   BILIRUBINUR small 06/08/2012 1237   KETONESUR NEGATIVE 12/29/2015 1252   PROTEINUR NEGATIVE 12/29/2015 1252   UROBILINOGEN 0.2 06/08/2012 1237   UROBILINOGEN 0.2 02/07/2012 1135   NITRITE NEGATIVE 12/29/2015 1252   LEUKOCYTESUR NEGATIVE 12/29/2015 1252    Radiological Exams on Admission: DG Chest Portable 1 View  Result Date: 01/02/2023 CLINICAL DATA:  Cough and fever. EXAM: PORTABLE CHEST 1 VIEW COMPARISON:  01/16/2020, chest CT 02/28/2022 FINDINGS: Upper normal heart size. Diffuse interstitial coarsening with patchy opacity at the bases. No pneumothorax or significant pleural effusion. No acute osseous findings. IMPRESSION: Diffuse interstitial coarsening with patchy opacity at the bases, suspicious for multifocal pneumonia, including atypical viral infection. Less likely consideration pulmonary edema. Electronically Signed   By: Narda Rutherford M.D.   On: 01/02/2023 18:55    EKG: Independently reviewed.  Sinus rhythm, no acute ischemic changes.  Assessment and Plan  Multifocal pneumonia COVID-19 virus infection Mild leukocytosis, no fever or signs of sepsis.  Not hypoxic.  Patient was given ceftriaxone and azithromycin in the ED.  Check procalcitonin level and continue antibiotics if procalcitonin is elevated.  Start remdesivir.  Continue IV steroids given diffuse rhonchi on exam.  Airborne and contact precautions.  Blood cultures pending.  Acute COPD exacerbation In the setting of COVID infection.  She has diffuse rhonchi on exam but no hypoxia or respiratory distress.  High risk allergy to albuterol (history of anaphylaxis).  She was given IV Decadron 6 mg and IV mag 2 g in the  ED.  Continue IV steroids and antibiotics.  Continue home inhalers after pharmacy med rec is done.  Hypoglycemia Insulin-dependent type 2 diabetes Hypoglycemic in the setting poor p.o. intake due to acute viral illness.  Current home meds include Lantus 25 units daily, glimepiride, metformin, and Jardiance.  A1c 7.7 on 12/13/2022.  Glucose initially in the 40s, improved after IV dextrose given by EMS.  No recurrence of hypoglycemia in the ED.  Hold insulin and oral hypoglycemic agents at this time.  Continue CBG checks every 4 hours.  Altered mental status Likely related to hypoglycemia as her mental status improved immediately after administration of IV dextrose by EMS.  She is currently AAO x 4 and answering questions appropriately.  No focal neurodeficit on exam.  Hyponatremia Likely related to poor p.o. intake in the setting of acute viral illness.  Sodium currently 122, was 136-140 on labs done in May 2024.  Continue gentle IV fluid hydration with normal saline.  Monitor sodium level every 4 hours and adjust IV fluids accordingly.  Avoid rapid correction, goal rate of correction is 4 to 6 mEq in 24 hours.  Check serum osmolarity, urine sodium, and urine osmolarity.  AKI on CKD stage IIIb Likely prerenal azotemia from dehydration.  She has dry mucous membranes.  BUN 51, creatinine 2.7 (baseline 1.2-1.4).  Continue gentle IV fluid hydration and monitor renal function.  Avoid nephrotoxic agents/hold home Entresto and Lasix.  Transaminitis Possibly due to acute viral infection.  Patient has no abdominal pain/tenderness, nausea, or  vomiting.  Repeat LFTs in the morning and pursue additional workup if not improving.  Avoid hepatotoxic agents/hold statin.  Chronic HFpEF Mild MVR Echo done in February 2024 showing EF 60 to 65%, mild MVR.  She has pedal edema on exam but no rales on auscultation of the lungs.  Chest x-ray more concerning for multifocal pneumonia rather than pulmonary edema.  Check BNP  and monitor volume status closely.  Hyperlipidemia Hold statin until LFTs improved.  Paroxysmal atrial flutter Currently in sinus rhythm.  Continue metformin and Xarelto after pharmacy med rec is done.  Hypertension: Blood pressure stable. GERD Chronic pain syndrome Pharmacy med rec pending.  DVT prophylaxis: Continue Xarelto after pharmacy med rec is done. Code Status: Full Code (discussed with the patient) Level of care: Progressive Care Unit Admission status: It is my clinical opinion that admission to INPATIENT is reasonable and necessary because of the expectation that this patient will require hospital care that crosses at least 2 midnights to treat this condition based on the medical complexity of the problems presented.  Given the aforementioned information, the predictability of an adverse outcome is felt to be significant.  John Giovanni MD [facility]  If 7PM-7AM, please contact night-coverage [email or web address]  01/02/2023, 7:32 PM

## 2023-01-02 NOTE — ED Triage Notes (Signed)
Patient BIB EMS today from home. Patients family reported that she was unresponsive and not answering them. Blood sugar when EMS arrived was 49/ History of PNA. Patient is a diabetic but does takes Lantus at night and Metfromin. After 25g of D10 blood sugar was 118 per EMS.   Rhonic noted in all 4 lung fields.   Vitals  97% (RA) 66 HR 160/86   Alert and aware x4

## 2023-01-02 NOTE — Plan of Care (Signed)
  Problem: Coping: Goal: Psychosocial and spiritual needs will be supported Outcome: Completed/Met   Problem: Respiratory: Goal: Will maintain a patent airway Outcome: Completed/Met

## 2023-01-02 NOTE — ED Provider Notes (Signed)
[place] EMERGENCY DEPARTMENT AT [facility] Provider Note   CSN: [id number] Arrival date & time: 01/02/23  1647     History Chief Complaint  Patient presents with   Hypoglycemia    HPI Kaitlin Cantrell is a 67 y.o. female presenting for shortness of breath, cough, fever and altered mental status.  EMS was called out for unresponsiveness found to have a glucose of 49 treated with dextrose with immediate restoration of consciousness. She states she has had 5 days of fever cough shortness of breath.  Has a 55-year smoking history pack a day. States that her dyspnea has gotten worse particular over the last 48 hours.  She took her Lantus and metformin last night but did not eat anything today. She is otherwise ambulatory in no acute distress at this time..   Patient's recorded medical, surgical, social, medication list and allergies were reviewed in the Snapshot window as part of the initial history.   Review of Systems   Review of Systems  Constitutional:  Positive for fever. Negative for chills.  HENT:  Positive for congestion. Negative for ear pain and sore throat.   Eyes:  Negative for pain and visual disturbance.  Respiratory:  Positive for cough, shortness of breath and wheezing.   Cardiovascular:  Negative for chest pain and palpitations.  Gastrointestinal:  Negative for abdominal pain and vomiting.  Genitourinary:  Negative for dysuria and hematuria.  Musculoskeletal:  Negative for arthralgias and back pain.  Skin:  Negative for color change and rash.  Neurological:  Negative for seizures and syncope.  All other systems reviewed and are negative.   Physical Exam Updated Vital Signs BP 136/63   Pulse 67   Temp 97.6 F (36.4 C) (Oral)   Resp 15   Ht 5\' 6"  (1.676 m)   Wt 115.2 kg   SpO2 99%   BMI 41.00 kg/m  Physical Exam Vitals and nursing note reviewed.  Constitutional:      General: She is not in acute distress.    Appearance: She is  well-developed.  HENT:     Head: Normocephalic and atraumatic.  Eyes:     Conjunctiva/sclera: Conjunctivae normal.  Cardiovascular:     Rate and Rhythm: Normal rate and regular rhythm.     Heart sounds: No murmur heard. Pulmonary:     Effort: Pulmonary effort is normal. No respiratory distress.     Breath sounds: Normal breath sounds.  Abdominal:     General: There is no distension.     Palpations: Abdomen is soft.     Tenderness: There is no abdominal tenderness. There is no right CVA tenderness or left CVA tenderness.  Musculoskeletal:        General: No swelling or tenderness. Normal range of motion.     Cervical back: Neck supple.  Skin:    General: Skin is warm and dry.  Neurological:     General: No focal deficit present.     Mental Status: She is alert and oriented to person, place, and time. Mental status is at baseline.     Cranial Nerves: No cranial nerve deficit.      ED Course/ Medical Decision Making/ A&P Clinical Course as of 01/02/23 Kaitlin Cantrell Jan 02, 2023  1903 DG Chest Portable 1 View [CC]    Clinical Course User Index [CC] Glyn Ade, MD    Procedures .Critical Care  Performed by: Glyn Ade, MD Authorized by: Glyn Ade, MD   Critical care provider statement:  Critical care time (minutes):  30   Critical care was necessary to treat or prevent imminent or life-threatening deterioration of the following conditions:  Renal failure, respiratory failure, metabolic crisis and dehydration   Critical care was time spent personally by me on the following activities:  Development of treatment plan with patient or surrogate, discussions with consultants, evaluation of patient's response to treatment, examination of patient, ordering and review of laboratory studies, ordering and review of radiographic studies, ordering and performing treatments and interventions, pulse oximetry, re-evaluation of patient's condition and review of old charts     Medications Ordered in ED Medications  lactated ringers bolus 1,000 mL (has no administration in time range)  azithromycin (ZITHROMAX) 500 mg in sodium chloride 0.9 % 250 mL IVPB (has no administration in time range)  dexamethasone (DECADRON) injection 6 mg (has no administration in time range)  cefTRIAXone (ROCEPHIN) 1 g in sodium chloride 0.9 % 100 mL IVPB (has no administration in time range)  magnesium sulfate IVPB 2 g 50 mL (0 g Intravenous Stopped 01/02/23 1901)    Medical Decision Making:    Kaitlin Cantrell is a 67 y.o. female who presented to the ED today with shortness of breath detailed above.     Complete initial physical exam performed, notably the patient  was hemodynamically stable no acute distress.      Reviewed and confirmed nursing documentation for past medical history, family history, social history.    Initial Assessment:   With the patient's presentation of shortness of breath, most likely diagnosis is pneumonia versus COVID versus other viral syndrome. Other diagnoses were considered including (but not limited to) PE, ACS, pneumonia, pneumothorax. These are considered less likely due to history of present illness and physical exam findings.   This is most consistent with an acute life/limb threatening illness complicated by underlying chronic conditions.  Initial Plan:  Screening labs including CBC and Metabolic panel to evaluate for infectious or metabolic etiology of disease.  Urinalysis with reflex culture ordered to evaluate for UTI or relevant urologic/nephrologic pathology.  CXR to evaluate for structural/infectious intrathoracic pathology.  EKG to evaluate for cardiac pathology. Objective evaluation as below reviewed with plan for close reassessment  Initial Study Results:   Laboratory  AKI, hyponatremia, anion gap acidosis (relatively mild treating with fluids) Leukocytosis, COVID-positive  EKG EKG was reviewed independently. Rate, rhythm, axis,  intervals all examined and without medically relevant abnormality. ST segments without concerns for elevations.    Radiology  All images reviewed independently. Agree with radiology report at this time.   DG Chest Portable 1 View  Result Date: 01/02/2023 CLINICAL DATA:  Cough and fever. EXAM: PORTABLE CHEST 1 VIEW COMPARISON:  01/16/2020, chest CT 02/28/2022 FINDINGS: Upper normal heart size. Diffuse interstitial coarsening with patchy opacity at the bases. No pneumothorax or significant pleural effusion. No acute osseous findings. IMPRESSION: Diffuse interstitial coarsening with patchy opacity at the bases, suspicious for multifocal pneumonia, including atypical viral infection. Less likely consideration pulmonary edema. Electronically Signed   By: Narda Rutherford M.D.   On: 01/02/2023 18:55     Consults:  Case discussed with hospitalist.   Reassessment and Plan:   Patient with atypical appearing pneumonia.  Treated with azithromycin, ceftriaxone IV fluids and dexamethasone. Hospitalist to determine COVID treatment as she is also COVID-positive. Frequently reassessed no acute distress arranging for admission.   Disposition:   Based on the above findings, I believe this patient is stable for admission.    Patient/family educated about  specific findings on our evaluation and explained exact reasons for admission.  Patient/family educated about clinical situation and time was allowed to answer questions.   Admission team communicated with and agreed with need for admission. Patient admitted. Patient ready to move at this time.     Emergency Department Medication Summary:   Medications  lactated ringers bolus 1,000 mL (has no administration in time range)  azithromycin (ZITHROMAX) 500 mg in sodium chloride 0.9 % 250 mL IVPB (has no administration in time range)  dexamethasone (DECADRON) injection 6 mg (has no administration in time range)  cefTRIAXone (ROCEPHIN) 1 g in sodium chloride 0.9 %  100 mL IVPB (has no administration in time range)  magnesium sulfate IVPB 2 g 50 mL (0 g Intravenous Stopped 01/02/23 1901)         Clinical Impression:  1. Altered mental status, unspecified altered mental status type   2. Hyponatremia   3. AKI (acute kidney injury) (HCC)      Admit   Final Clinical Impression(s) / ED Diagnoses Final diagnoses:  Altered mental status, unspecified altered mental status type  Hyponatremia  AKI (acute kidney injury) [facility])    Rx / DC Orders ED Discharge Orders     None         Glyn Ade, MD 01/02/23 [phone]

## 2023-01-03 ENCOUNTER — Other Ambulatory Visit (HOSPITAL_COMMUNITY): Payer: Self-pay

## 2023-01-03 DIAGNOSIS — U071 COVID-19: Secondary | ICD-10-CM | POA: Diagnosis not present

## 2023-01-03 DIAGNOSIS — J1282 Pneumonia due to coronavirus disease 2019: Secondary | ICD-10-CM | POA: Diagnosis not present

## 2023-01-03 LAB — BASIC METABOLIC PANEL
Anion gap: 12 (ref 5–15)
Anion gap: 15 (ref 5–15)
BUN: 52 mg/dL — ABNORMAL HIGH (ref 8–23)
BUN: 52 mg/dL — ABNORMAL HIGH (ref 8–23)
CO2: 22 mmol/L (ref 22–32)
CO2: 20 mmol/L — ABNORMAL LOW (ref 22–32)
Calcium: 8.1 mg/dL — ABNORMAL LOW (ref 8.9–10.3)
Calcium: 8.6 mg/dL — ABNORMAL LOW (ref 8.9–10.3)
Chloride: 89 mmol/L — ABNORMAL LOW (ref 98–111)
Chloride: 87 mmol/L — ABNORMAL LOW (ref 98–111)
Creatinine, Ser: 2.68 mg/dL — ABNORMAL HIGH (ref 0.44–1.00)
Creatinine, Ser: 2 mg/dL — ABNORMAL HIGH (ref 0.44–1.00)
GFR, Estimated: 19 mL/min — ABNORMAL LOW (ref >60)
GFR, Estimated: 27 mL/min — ABNORMAL LOW (ref >60)
Glucose, Bld: 238 mg/dL — ABNORMAL HIGH (ref 70–99)
Glucose, Bld: 188 mg/dL — ABNORMAL HIGH (ref 70–99)
Potassium: 5.3 mmol/L — ABNORMAL HIGH (ref 3.5–5.1)
Potassium: 5.5 mmol/L — ABNORMAL HIGH (ref 3.5–5.1)
Sodium: 123 mmol/L — ABNORMAL LOW (ref 135–145)
Sodium: 122 mmol/L — ABNORMAL LOW (ref 135–145)

## 2023-01-03 LAB — HEPATIC FUNCTION PANEL
ALT: 62 U/L — ABNORMAL HIGH (ref 0–44)
AST: 86 U/L — ABNORMAL HIGH (ref 15–41)
Albumin: 2.3 g/dL — ABNORMAL LOW (ref 3.5–5.0)
Alkaline Phosphatase: 81 U/L (ref 38–126)
Bilirubin, Direct: 0.2 mg/dL (ref 0.0–0.2)
Indirect Bilirubin: 0.4 mg/dL (ref 0.3–0.9)
Total Bilirubin: 0.6 mg/dL (ref 0.3–1.2)
Total Protein: 6.3 g/dL — ABNORMAL LOW (ref 6.5–8.1)

## 2023-01-03 LAB — CBC
HCT: 33.2 % — ABNORMAL LOW (ref 36.0–46.0)
Hemoglobin: 10.4 g/dL — ABNORMAL LOW (ref 12.0–15.0)
MCH: 25.9 pg — ABNORMAL LOW (ref 26.0–34.0)
MCHC: 31.3 g/dL (ref 30.0–36.0)
MCV: 82.6 fL (ref 80.0–100.0)
Platelets: 385 10*3/uL (ref 150–400)
RBC: 4.02 MIL/uL (ref 3.87–5.11)
RDW: 14.9 % (ref 11.5–15.5)
WBC: 9.7 10*3/uL (ref 4.0–10.5)
nRBC: 0 % (ref 0.0–0.2)

## 2023-01-03 LAB — GLUCOSE, CAPILLARY
Glucose-Capillary: 195 mg/dL — ABNORMAL HIGH (ref 70–99)
Glucose-Capillary: 198 mg/dL — ABNORMAL HIGH (ref 70–99)
Glucose-Capillary: 221 mg/dL — ABNORMAL HIGH (ref 70–99)
Glucose-Capillary: 227 mg/dL — ABNORMAL HIGH (ref 70–99)
Glucose-Capillary: 241 mg/dL — ABNORMAL HIGH (ref 70–99)
Glucose-Capillary: 252 mg/dL — ABNORMAL HIGH (ref 70–99)

## 2023-01-03 LAB — BRAIN NATRIURETIC PEPTIDE: B Natriuretic Peptide: 1021.6 pg/mL — ABNORMAL HIGH (ref 0.0–100.0)

## 2023-01-03 LAB — PROCALCITONIN: Procalcitonin: 0.4 ng/mL

## 2023-01-03 LAB — SODIUM, URINE, RANDOM: Sodium, Ur: 19 mmol/L

## 2023-01-03 MED ORDER — INSULIN GLARGINE-YFGN 100 UNIT/ML ~~LOC~~ SOLN
15.0000 [IU] | Freq: Every day | SUBCUTANEOUS | Status: DC
  Administered 2023-01-03: 15 [IU] via SUBCUTANEOUS
  Filled 2023-01-03 (×2): qty 0.15

## 2023-01-03 MED ORDER — INSULIN ASPART 100 UNIT/ML IJ SOLN
0.0000 [IU] | Freq: Every day | INTRAMUSCULAR | Status: DC
  Administered 2023-01-04: 2 [IU] via SUBCUTANEOUS
  Administered 2023-01-07: 3 [IU] via SUBCUTANEOUS

## 2023-01-03 MED ORDER — SODIUM CHLORIDE 0.9 % IV SOLN: INTRAVENOUS | Status: DC

## 2023-01-03 MED ORDER — INSULIN ASPART 100 UNIT/ML IJ SOLN
0.0000 [IU] | Freq: Three times a day (TID) | INTRAMUSCULAR | Status: DC
  Administered 2023-01-03: 5 [IU] via SUBCUTANEOUS
  Administered 2023-01-03: 3 [IU] via SUBCUTANEOUS
  Administered 2023-01-04: 5 [IU] via SUBCUTANEOUS

## 2023-01-03 MED ORDER — RIVAROXABAN 20 MG PO TABS
20.0000 mg | ORAL_TABLET | Freq: Every day | ORAL | Status: DC
  Administered 2023-01-03: 20 mg via ORAL
  Filled 2023-01-03: qty 1

## 2023-01-03 MED ORDER — METOPROLOL SUCCINATE ER 100 MG PO TB24
200.0000 mg | ORAL_TABLET | Freq: Every day | ORAL | Status: DC
  Administered 2023-01-03 – 2023-01-08 (×6): 200 mg via ORAL
  Filled 2023-01-03 (×7): qty 2

## 2023-01-03 MED ORDER — IPRATROPIUM BROMIDE 0.02 % IN SOLN: 0.5000 mg | Freq: Four times a day (QID) | RESPIRATORY_TRACT | Status: DC | PRN

## 2023-01-03 MED ORDER — BUDESONIDE 0.25 MG/2ML IN SUSP
0.2500 mg | Freq: Every day | RESPIRATORY_TRACT | Status: DC
  Filled 2023-01-03: qty 2

## 2023-01-03 MED ORDER — RIVAROXABAN 15 MG PO TABS: 15.0000 mg | ORAL_TABLET | Freq: Every day | ORAL | Status: DC

## 2023-01-03 MED ORDER — ACETAMINOPHEN 325 MG PO TABS
650.0000 mg | ORAL_TABLET | Freq: Four times a day (QID) | ORAL | Status: DC | PRN
  Administered 2023-01-03 – 2023-01-07 (×8): 650 mg via ORAL
  Filled 2023-01-03 (×8): qty 2

## 2023-01-03 MED ORDER — SODIUM ZIRCONIUM CYCLOSILICATE 10 G PO PACK
10.0000 g | PACK | Freq: Once | ORAL | Status: AC
  Administered 2023-01-03: 10 g via ORAL
  Filled 2023-01-03: qty 1

## 2023-01-03 MED ORDER — MOMETASONE FURO-FORMOTEROL FUM 100-5 MCG/ACT IN AERO: 2.0000 | INHALATION_SPRAY | Freq: Two times a day (BID) | RESPIRATORY_TRACT | Status: DC

## 2023-01-03 MED ORDER — CYCLOBENZAPRINE HCL 10 MG PO TABS
10.0000 mg | ORAL_TABLET | Freq: Three times a day (TID) | ORAL | Status: DC | PRN
  Administered 2023-01-03 – 2023-01-07 (×11): 10 mg via ORAL
  Filled 2023-01-03 (×12): qty 1

## 2023-01-03 MED ORDER — PANTOPRAZOLE SODIUM 40 MG PO TBEC
80.0000 mg | DELAYED_RELEASE_TABLET | Freq: Every day | ORAL | Status: DC
  Administered 2023-01-03 – 2023-01-08 (×6): 80 mg via ORAL
  Filled 2023-01-03 (×7): qty 2

## 2023-01-03 MED ORDER — GABAPENTIN 400 MG PO CAPS
400.0000 mg | ORAL_CAPSULE | Freq: Three times a day (TID) | ORAL | Status: DC
  Administered 2023-01-03 – 2023-01-08 (×16): 400 mg via ORAL
  Filled 2023-01-03 (×16): qty 1

## 2023-01-03 MED ORDER — FLUTICASONE FUROATE-VILANTEROL 200-25 MCG/ACT IN AEPB
1.0000 | INHALATION_SPRAY | Freq: Every day | RESPIRATORY_TRACT | Status: DC
  Administered 2023-01-03 – 2023-01-07 (×5): 1 via RESPIRATORY_TRACT
  Filled 2023-01-03: qty 28

## 2023-01-03 NOTE — Progress Notes (Signed)
PROGRESS NOTE    Kaitlin Cantrell  ZOX:[id number] DOB: [birth year] DOA: 01/02/2023 PCP: [name], MD    Brief Narrative:   Kaitlin Cantrell is a 67 y.o. female with past medical history significant for DM2, HTN, HLD, atrial flutter on Xarelto, chronic diastolic congestive heart failure, history of PE 2017, COPD, NICM, chronic pain syndrome, CKD stage IIIb, history of COVID infection 2021, GERD who presented to [facility] ED on 9/2 via EMS for altered mental status.  Patient was found unresponsive by family.  On EMS arrival, glucose was noted to be 49 and patient was given 25 g of D10 with proven of glucose to 118; in addition to improvement of her mental status.  Patient also endorsed shortness of breath and cough.  She reports she has been feeling ill for the past week with also associated fatigue and poor oral intake.  She currently is on Lantus 25 units daily, metformin and glimepiride for diabetes; patient was transported by EMS to the ED for further evaluation and management.  In the ED, temperature 97.6 3 Fahrenheit, HR 63, RR 15, BP 121/51, SpO2 96% on room air.  WBC 14.1, hemoglobin 10.4, platelet count 401.  Sodium 122, potassium 4.7, chloride 85, CO2 21, glucose 120, BUN 51, creatinine 2.75.  AST 138, ALT 76, total bilirubin 0.9.  BNP 1021.6.  SARS-CoV-2 positive.  Chest x-ray with diffuse interstitial coarsening with patchy opacity at the bases suspicious for multifocal pneumonia including atypical viral infection, less likely consideration for pulmonary edema.  EKG with normal sinus rhythm, no concerning ST elevation/depressions or T wave inversions.  Patient received ceftriaxone, azithromycin, 1 L LR bolus in the ED.  TRH consulted for admission for further evaluation management of hypoglycemia, COVID viral infection, hyponatremia.  Assessment & Plan:   Multifocal pneumonia COVID-19 virus infection Patient presenting to the ED with shortness of breath, cough.  Mild  leukocytosis, no fever or signs of sepsis.  Not hypoxic.  Patient was given ceftriaxone and azithromycin in the ED. etiology likely secondary to COVID viral infection. -- Procalcitonin: Pending -- Continue remdesivir -- Decadron 6 mg IV every 24 hours x 10 days -- Airborne/contact isolation precautions -- Supportive care  Acute COPD exacerbation In the setting of COVID infection.  She has diffuse rhonchi on exam but no hypoxia or respiratory distress.  High risk allergy to albuterol (history of anaphylaxis).  -- Decadron 6 mg IV every 24 hours as above -- Breo Ellipta 1 puff daily (substituted for home Symbicort) -- Atrovent neb every 6 hours as needed wheezing/shortness of breath   Hypoglycemia: Resolved Insulin-dependent type 2 diabetes Hypoglycemic in the setting poor p.o. intake due to acute viral illness.  Current home meds include Lantus 25 units daily, glimepiride, metformin, and Jardiance.  A1c 7.7 on 12/13/2022.  Glucose initially in the 40s, improved after IV dextrose given by EMS.  No recurrence of hypoglycemia in the ED.   -- Semglee 15 units subcutaneously daily -- Holding home metformin/glimepiride -- SSI for coverage -- CBGs qAC/HS   Altered mental status: Resolved Likely related to hypoglycemia as her mental status improved immediately after administration of IV dextrose by EMS.  She is currently AAO x 4 and answering questions appropriately.  No focal neurodeficit on exam.   Hyponatremia Likely related to poor p.o. intake in the setting of acute viral illness.  Sodium 122 on admission, was 136-140 on labs done in May 2024.   -- NS at 75 mL/h -- repeat BMP  q12h  AKI on CKD stage IIIb Likely prerenal azotemia from dehydration.  She has dry mucous membranes.  BUN 51, creatinine 2.7 (baseline 1.2-1.4).  Continue gentle IV fluid hydration and monitor renal function.  Avoid nephrotoxic agents/hold home Entresto and Lasix. -- Cr  2.75>>2.68 -- NS at 75 mL/h -- BMP q12h    Transaminitis Possibly due to acute viral infection.  Patient has no abdominal pain/tenderness, nausea, or vomiting.   -- AST 138>86 -- ALT 76>62 -- Tbili 0.9>0.6 -- Hold home statin -- Repeat LFTs in a.m.  Hypokalemia Potassium 5.3, etiology likely secondary to acute renal failure as above. -- Lokelma 10 g p.o. x 1 today -- BMP in a.m.   Chronic HFpEF Mild MVR Essential hypertension Echo done in February 2024 showing EF 60 to 65%, mild MVR.  She has pedal edema on exam but no rales on auscultation of the lungs.  Chest x-ray more concerning for multifocal pneumonia rather than pulmonary edema.  -- Metoprolol succinate 20 mg p.o. daily -- Holding furosemide, Entresto secondary to volume depletion and borderline hypotension   Hyperlipidemia Hold statin until LFTs improved.   Paroxysmal atrial flutter Currently in sinus rhythm.  --Metoprolol succinate 200 mg p.o. daily -- Continue Xarelto   GERD -- Continue PPI  Chronic pain syndrome -- Tylenol 650 mg p.o. every 6 hours as needed mild pain/fever/headache -- Flexeril 10 mg p.o. 3 times daily as needed muscle spasms -- Gabapentin 40 mg p.o. 3 times daily   DVT prophylaxis:  rivaroxaban (XARELTO) tablet 20 mg    Code Status: Full Code Family Communication: No family present at bedside this morning  Disposition Plan:  Level of care: Progressive Status is: Inpatient Remains inpatient appropriate because: IV steroids, IV remdesivir    Consultants:  None  Procedures:  None  Antimicrobials:  Azithromycin 9/2 - 9/2 Ceftriaxone 9/2 - 9/2   Subjective: Patient seen examined bedside, resting calmly.  Lying in bed.  Shortness of breath improved.  Continues to feel "fatigued".  No other specific questions or concerns at this time.  Denies headache, no visual changes, no chest pain, no palpitations, no fever/chills/night sweats, no nausea/vomiting/diarrhea, no focal weakness, no productive cough, no paresthesias.  No acute  events overnight per nursing staff.  Objective: Vitals:   01/03/23 0015 01/03/23 0420 01/03/23 0812 01/03/23 0831  BP: 113/62 94/71 (!) 151/78   Pulse:   81   Resp:   20 18  Temp: 98.5 F (36.9 C) 97.6 F (36.4 C) 98 F (36.7 C)   TempSrc: Oral Oral Oral   SpO2:   96%   Weight:      Height:       No intake or output data in the 24 hours ending 01/03/23 1117 Filed Weights   01/02/23 1659 01/02/23 2113  Weight: 115.2 kg 122.4 kg    Examination:  Physical Exam: GEN: NAD, alert and oriented x 3, wd/wn HEENT: NCAT, PERRL, EOMI, sclera clear, dry mucous membranes PULM: Breath sounds slight diminished bilateral bases with rhonchi, no wheezing, normal respiratory effort without accessory muscle use, on room air CV: RRR w/o M/G/R GI: abd soft, NTND, NABS, no R/G/M MSK: no peripheral edema, muscle strength globally intact 5/5 bilateral upper/lower extremities NEURO: CN II-XII intact, no focal deficits, sensation to light touch intact PSYCH: normal mood/affect Integumentary: dry/intact, no rashes or wounds    Data Reviewed: I have personally reviewed following labs and imaging studies  CBC: Recent Labs  Lab 01/02/23 1718 01/03/23 0447  WBC  14.1* 9.7  NEUTROABS 11.4*  --   HGB 10.4* 10.4*  HCT 34.6* 33.2*  MCV 84.2 82.6  PLT 401* 385   Basic Metabolic Panel: Recent Labs  Lab 01/02/23 1718 01/02/23 2124 01/03/23 0447  NA 122* 127* 123*  K 4.7 5.2* 5.3*  CL 85* 89* 89*  CO2 21* 24 22  GLUCOSE 120* 127* 238*  BUN 51* 51* 52*  CREATININE 2.75* 2.66* 2.68*  CALCIUM 8.5* 8.6* 8.1*   GFR: Estimated Creatinine Clearance: 27.9 mL/min (A) (by C-G formula based on SCr of 2.68 mg/dL (H)). Liver Function Tests: Recent Labs  Lab 01/02/23 1718 01/03/23 0447  AST 138* 86*  ALT 76* 62*  ALKPHOS 84 81  BILITOT 0.9 0.6  PROT 6.5 6.3*  ALBUMIN 2.6* 2.3*   No results for input(s): "LIPASE", "AMYLASE" in the last 168 hours. No results for input(s): "AMMONIA" in the  last 168 hours. Coagulation Profile: No results for input(s): "INR", "PROTIME" in the last 168 hours. Cardiac Enzymes: No results for input(s): "CKTOTAL", "CKMB", "CKMBINDEX", "TROPONINI" in the last 168 hours. BNP (last 3 results) No results for input(s): "PROBNP" in the last 8760 hours. HbA1C: No results for input(s): "HGBA1C" in the last 72 hours. CBG: Recent Labs  Lab 01/02/23 1712 01/02/23 2122 01/03/23 0013 01/03/23 0427 01/03/23 0818  GLUCAP 99 111* 198* 241* 227*   Lipid Profile: No results for input(s): "CHOL", "HDL", "LDLCALC", "TRIG", "CHOLHDL", "LDLDIRECT" in the last 72 hours. Thyroid Function Tests: No results for input(s): "TSH", "T4TOTAL", "FREET4", "T3FREE", "THYROIDAB" in the last 72 hours. Anemia Panel: No results for input(s): "VITAMINB12", "FOLATE", "FERRITIN", "TIBC", "IRON", "RETICCTPCT" in the last 72 hours. Sepsis Labs: No results for input(s): "PROCALCITON", "LATICACIDVEN" in the last 168 hours.  Recent Results (from the past 240 hour(s))  SARS Coronavirus 2 by RT PCR (hospital order, performed in [facility] hospital lab) *cepheid single result test* Anterior Nasal Swab     Status: Abnormal   Collection Time: 01/02/23  6:10 PM   Specimen: Anterior Nasal Swab  Result Value Ref Range Status   SARS Coronavirus 2 by RT PCR POSITIVE (A) NEGATIVE Final    Comment: Performed at [facility], [address], [place], [place] [address]  Blood culture (routine x 2)     Status: None (Preliminary result)   Collection Time: 01/02/23  7:49 PM   Specimen: BLOOD RIGHT HAND  Result Value Ref Range Status   Specimen Description BLOOD RIGHT HAND  Final   Special Requests   Final    BOTTLES DRAWN AEROBIC AND ANAEROBIC Blood Culture results may not be optimal due to an inadequate volume of blood received in culture bottles   Culture   Final    NO GROWTH < 12 HOURS Performed at [facility], [address], [place], [place] [address]    Report Status  PENDING  Incomplete  Blood culture (routine x 2)     Status: None (Preliminary result)   Collection Time: 01/02/23  7:49 PM   Specimen: BLOOD LEFT HAND  Result Value Ref Range Status   Specimen Description BLOOD LEFT HAND  Final   Special Requests   Final    BOTTLES DRAWN AEROBIC AND ANAEROBIC Blood Culture adequate volume   Culture   Final    NO GROWTH < 12 HOURS Performed at [facility], [address] [place], [place] [address]    Report Status PENDING  Incomplete  Radiology Studies: DG Chest Portable 1 View  Result Date: 01/02/2023 CLINICAL DATA:  Cough and fever. EXAM: PORTABLE CHEST 1 VIEW COMPARISON:  01/16/2020, chest CT 02/28/2022 FINDINGS: Upper normal heart size. Diffuse interstitial coarsening with patchy opacity at the bases. No pneumothorax or significant pleural effusion. No acute osseous findings. IMPRESSION: Diffuse interstitial coarsening with patchy opacity at the bases, suspicious for multifocal pneumonia, including atypical viral infection. Less likely consideration pulmonary edema. Electronically Signed   By: Narda Rutherford M.D.   On: 01/02/2023 18:55        Scheduled Meds:  dexamethasone (DECADRON) injection  6 mg Intravenous Q24H   dextromethorphan-guaiFENesin  1 tablet Oral BID   fluticasone furoate-vilanterol  1 puff Inhalation Daily   gabapentin  400 mg Oral TID   insulin aspart  0-5 Units Subcutaneous QHS   insulin aspart  0-9 Units Subcutaneous TID WC   insulin glargine-yfgn  15 Units Subcutaneous Daily   rivaroxaban  20 mg Oral Daily   Continuous Infusions:  remdesivir 100 mg in sodium chloride 0.9 % 100 mL IVPB       LOS: 1 day    Time spent: 54 minutes spent on chart review, discussion with nursing staff, consultants, updating family and interview/physical exam; more than 50% of that time was spent in counseling and/or coordination of care.    Alvira Philips [place], DO [facility] Available via Epic secure chat  7am-7pm After these hours, please refer to coverage provider listed on [email or web address] 01/03/2023, 11:17 AM

## 2023-01-03 NOTE — Inpatient Diabetes Management (Signed)
Inpatient Diabetes Program Recommendations  AACE/ADA: New Consensus Statement on Inpatient Glycemic Control (2015)  Target Ranges:  Prepandial:   less than 140 mg/dL      Peak postprandial:   less than 180 mg/dL (1-2 hours)      Critically ill patients:  140 - 180 mg/dL   Lab Results  Component Value Date   GLUCAP 227 (H) 01/03/2023   HGBA1C 7.7 12/13/2022    Latest Reference Range & Units 01/02/23 21:22 01/03/23 00:13 01/03/23 04:27 01/03/23 08:18  Glucose-Capillary 70 - 99 mg/dL 161 (H) 096 (H) 045 (H) 227 (H)  (H): Data is abnormally high  Diabetes history: DM2 Outpatient Diabetes medications: Lantus 25 units, Amaryl 4 mg bid, Jardiance 10 mg qday, Metformin 850 mg. Bid. Current orders for Inpatient glycemic control: Decadron 6 mg qd  Inpatient Diabetes Program Recommendations:   Please consider: -Semglee 15 units every day -Novolog 0-9 units tid, 0-5 units hs correction -Add carb modified to diet if appropriate   Thank you, Darel Hong E. Isbella Arline, RN, MSN, CDE  Diabetes Coordinator Inpatient Glycemic Control Team Team Pager [phone] (8am-5pm) 01/03/2023 10:28 AM

## 2023-01-03 NOTE — TOC Initial Note (Signed)
Transition of Care [facility]) - Initial/Assessment Note    Patient Details  Name: Kaitlin Cantrell MRN: [id number] Date of Birth: 11-27-1955  Transition [facility] CM/SW Contact:    Lawerance Sabal, RN Phone Number: 01/03/2023, 2:34 PM  Clinical Narrative:                  Patient from home w spouse.  Admitted w COVID PNA, Hx COPD. Currently RA.  TOC following through progression rounds, please notify CM for DC needs.   Expected Discharge Plan: Home/Self Care Barriers to Discharge: Continued Medical Work up   Patient Goals and CMS Choice Patient states their goals for this hospitalization and ongoing recovery are:: return home          Expected Discharge Plan and Services   Discharge Planning Services: CM Consult   Living arrangements for the past 2 months: Single Family Home                                      Prior Living Arrangements/Services Living arrangements for the past 2 months: Single Family Home Lives with:: Spouse                   Activities of Daily Living Home Assistive Devices/Equipment: None ADL Screening (condition at time of admission) Patient's cognitive ability adequate to safely complete daily activities?: Yes Is the patient deaf or have difficulty hearing?: No Does the patient have difficulty seeing, even when wearing glasses/contacts?: No Does the patient have difficulty concentrating, remembering, or making decisions?: No Patient able to express need for assistance with ADLs?: Yes Does the patient have difficulty dressing or bathing?: No Independently performs ADLs?: Yes (appropriate for developmental age) Does the patient have difficulty walking or climbing stairs?: Yes Weakness of Legs: Both Weakness of Arms/Hands: None  Permission Sought/Granted                  Emotional Assessment              Admission diagnosis:  Hyponatremia [E87.1] AKI (acute kidney injury) (HCC) [N17.9] Altered mental status,  unspecified altered mental status type [R41.82] Pneumonia due to COVID-19 virus [U07.1, J12.82] Patient Active Problem List   Diagnosis Date Noted   Pneumonia due to COVID-19 virus 01/02/2023   Vitamin D deficiency 09/04/2022   Right thyroid nodule 02/02/2022   Stage 3 chronic kidney disease (HCC) 12/12/2021   Atrial fibrillation (HCC) 12/12/2021   BMI 40.0-44.9, adult (HCC) 12/12/2021   Atrial flutter with rapid ventricular response (HCC) 12/06/2019   Atrial flutter (HCC) 12/05/2019   COVID-19 12/05/2019   Bilateral lower extremity edema    Combined systolic and diastolic congestive heart failure (HCC)    Cardiomyopathy (HCC)    Pleural effusion    Pulmonary thromboembolism (HCC) 12/29/2015   Arthritis 10/01/2015   Hyperlipidemia associated with type 2 diabetes mellitus (HCC) 10/01/2015   Chronic pain syndrome 10/01/2015   Chronic obstructive pulmonary disease (HCC) 10/01/2015   Smoker 10/01/2015   Type 2 diabetes mellitus without complication, without long-term current use of insulin (HCC) 03/30/2012   Polypharmacy 03/30/2012   Osteoarthritis of right knee 02/17/2012    Class: Diagnosis of   Hypertension associated with type 2 diabetes mellitus (HCC) 05/10/2011   PCP:  [name], MD Pharmacy:   [facility] [address], [place] - [address] [place] [address] Phone: [phone]  Fax: [phone]     Social Determinants of Health (SDOH) Social History: SDOH Screenings   Food Insecurity: No Food Insecurity (01/02/2023)  Housing: Low Risk  (01/02/2023)  Transportation Needs: No Transportation Needs (01/02/2023)  Utilities: Not At Risk (01/02/2023)  Depression (PHQ2-9): Medium Risk (12/13/2022)  Financial Resource Strain: Low Risk  (09/17/2018)  Physical Activity: Insufficiently Active (09/17/2018)  Social Connections: Somewhat Isolated (09/17/2018)  Stress: No Stress Concern Present (09/17/2018)  Tobacco Use: High Risk  (01/02/2023)   SDOH Interventions:     Readmission Risk Interventions     No data to display

## 2023-01-03 NOTE — TOC Benefit Eligibility Note (Signed)
Pharmacy Patient Advocate Encounter  Insurance verification completed.    The patient is insured through [employer]/ [employer]   Ran test claim for Apixiaban 2.5mg  & 5mg . Currently a quantity of 60 is a 30 day supply and the co-pay is $142.54 .   This test claim was processed through [facility]- copay amounts may vary at other pharmacies due to pharmacy/plan contracts, or as the patient moves through the different stages of their insurance plan.

## 2023-01-03 NOTE — Progress Notes (Signed)
Pt requesting something for back pain. Provider on call notified via secure chat.

## 2023-01-04 DIAGNOSIS — J1282 Pneumonia due to coronavirus disease 2019: Secondary | ICD-10-CM | POA: Diagnosis not present

## 2023-01-04 DIAGNOSIS — U071 COVID-19: Secondary | ICD-10-CM | POA: Diagnosis not present

## 2023-01-04 LAB — BASIC METABOLIC PANEL
Anion gap: 13 (ref 5–15)
Anion gap: 11 (ref 5–15)
BUN: 51 mg/dL — ABNORMAL HIGH (ref 8–23)
BUN: 52 mg/dL — ABNORMAL HIGH (ref 8–23)
CO2: 20 mmol/L — ABNORMAL LOW (ref 22–32)
CO2: 22 mmol/L (ref 22–32)
Calcium: 8.8 mg/dL — ABNORMAL LOW (ref 8.9–10.3)
Calcium: 9.2 mg/dL (ref 8.9–10.3)
Chloride: 89 mmol/L — ABNORMAL LOW (ref 98–111)
Chloride: 93 mmol/L — ABNORMAL LOW (ref 98–111)
Creatinine, Ser: 1.77 mg/dL — ABNORMAL HIGH (ref 0.44–1.00)
Creatinine, Ser: 1.93 mg/dL — ABNORMAL HIGH (ref 0.44–1.00)
GFR, Estimated: 32 mL/min — ABNORMAL LOW (ref >60)
GFR, Estimated: 28 mL/min — ABNORMAL LOW (ref >60)
Glucose, Bld: 281 mg/dL — ABNORMAL HIGH (ref 70–99)
Glucose, Bld: 168 mg/dL — ABNORMAL HIGH (ref 70–99)
Potassium: 5.7 mmol/L — ABNORMAL HIGH (ref 3.5–5.1)
Potassium: 5.3 mmol/L — ABNORMAL HIGH (ref 3.5–5.1)
Sodium: 122 mmol/L — ABNORMAL LOW (ref 135–145)
Sodium: 126 mmol/L — ABNORMAL LOW (ref 135–145)

## 2023-01-04 LAB — HEPATIC FUNCTION PANEL
ALT: 50 U/L — ABNORMAL HIGH (ref 0–44)
AST: 47 U/L — ABNORMAL HIGH (ref 15–41)
Albumin: 2.6 g/dL — ABNORMAL LOW (ref 3.5–5.0)
Alkaline Phosphatase: 86 U/L (ref 38–126)
Bilirubin, Direct: 0.2 mg/dL (ref 0.0–0.2)
Indirect Bilirubin: 0.4 mg/dL (ref 0.3–0.9)
Total Bilirubin: 0.6 mg/dL (ref 0.3–1.2)
Total Protein: 6.4 g/dL — ABNORMAL LOW (ref 6.5–8.1)

## 2023-01-04 LAB — CBC
HCT: 37.9 % (ref 36.0–46.0)
Hemoglobin: 11.7 g/dL — ABNORMAL LOW (ref 12.0–15.0)
MCH: 26.5 pg (ref 26.0–34.0)
MCHC: 30.9 g/dL (ref 30.0–36.0)
MCV: 85.7 fL (ref 80.0–100.0)
Platelets: 367 10*3/uL (ref 150–400)
RBC: 4.42 MIL/uL (ref 3.87–5.11)
RDW: 15.1 % (ref 11.5–15.5)
WBC: 10 10*3/uL (ref 4.0–10.5)
nRBC: 0 % (ref 0.0–0.2)

## 2023-01-04 LAB — GLUCOSE, CAPILLARY
Glucose-Capillary: 229 mg/dL — ABNORMAL HIGH (ref 70–99)
Glucose-Capillary: 249 mg/dL — ABNORMAL HIGH (ref 70–99)
Glucose-Capillary: 269 mg/dL — ABNORMAL HIGH (ref 70–99)

## 2023-01-04 MED ORDER — HYDRALAZINE HCL 25 MG PO TABS
25.0000 mg | ORAL_TABLET | Freq: Three times a day (TID) | ORAL | Status: DC | PRN
  Administered 2023-01-04 – 2023-01-05 (×2): 25 mg via ORAL
  Filled 2023-01-04 (×2): qty 1

## 2023-01-04 MED ORDER — INSULIN ASPART 100 UNIT/ML IJ SOLN
0.0000 [IU] | Freq: Three times a day (TID) | INTRAMUSCULAR | Status: DC
  Administered 2023-01-04: 8 [IU] via SUBCUTANEOUS
  Administered 2023-01-04 – 2023-01-05 (×2): 11 [IU] via SUBCUTANEOUS
  Administered 2023-01-05: 8 [IU] via SUBCUTANEOUS
  Administered 2023-01-05: 3 [IU] via SUBCUTANEOUS
  Administered 2023-01-06: 11 [IU] via SUBCUTANEOUS
  Administered 2023-01-06: 15 [IU] via SUBCUTANEOUS
  Administered 2023-01-06: 5 [IU] via SUBCUTANEOUS
  Administered 2023-01-07 – 2023-01-08 (×3): 11 [IU] via SUBCUTANEOUS

## 2023-01-04 MED ORDER — SODIUM CHLORIDE 0.9 % IV SOLN: INTRAVENOUS | Status: DC

## 2023-01-04 MED ORDER — SODIUM ZIRCONIUM CYCLOSILICATE 10 G PO PACK
10.0000 g | PACK | Freq: Two times a day (BID) | ORAL | Status: AC
  Administered 2023-01-04 (×2): 10 g via ORAL
  Filled 2023-01-04 (×2): qty 1

## 2023-01-04 MED ORDER — INSULIN GLARGINE-YFGN 100 UNIT/ML ~~LOC~~ SOLN
20.0000 [IU] | Freq: Every day | SUBCUTANEOUS | Status: DC
  Administered 2023-01-04 – 2023-01-06 (×3): 20 [IU] via SUBCUTANEOUS
  Filled 2023-01-04 (×3): qty 0.2

## 2023-01-04 MED ORDER — INSULIN ASPART 100 UNIT/ML IJ SOLN: 0.0000 [IU] | Freq: Three times a day (TID) | INTRAMUSCULAR | Status: DC

## 2023-01-04 MED ORDER — RIVAROXABAN 20 MG PO TABS
20.0000 mg | ORAL_TABLET | Freq: Every day | ORAL | Status: DC
  Administered 2023-01-05 – 2023-01-08 (×4): 20 mg via ORAL
  Filled 2023-01-04 (×4): qty 1

## 2023-01-04 NOTE — Plan of Care (Signed)
  Problem: Education: Goal: Knowledge of risk factors and measures for prevention of condition will improve Outcome: Progressing   Problem: Respiratory: Goal: Complications related to the disease process, condition or treatment will be avoided or minimized Outcome: Progressing   Problem: Education: Goal: Knowledge of General Education information will improve Description: Including pain rating scale, medication(s)/side effects and non-pharmacologic comfort measures Outcome: Progressing   Problem: Health Behavior/Discharge Planning: Goal: Ability to manage health-related needs will improve Outcome: Progressing   Problem: Clinical Measurements: Goal: Ability to maintain clinical measurements within normal limits will improve Outcome: Progressing Goal: Will remain free from infection Outcome: Progressing Goal: Diagnostic test results will improve Outcome: Progressing Goal: Respiratory complications will improve Outcome: Progressing Goal: Cardiovascular complication will be avoided Outcome: Progressing   Problem: Activity: Goal: Risk for activity intolerance will decrease Outcome: Progressing   Problem: Nutrition: Goal: Adequate nutrition will be maintained Outcome: Progressing   Problem: Coping: Goal: Level of anxiety will decrease Outcome: Progressing   Problem: Elimination: Goal: Will not experience complications related to bowel motility Outcome: Progressing Goal: Will not experience complications related to urinary retention Outcome: Progressing   Problem: Pain Managment: Goal: General experience of comfort will improve Outcome: Progressing   Problem: Safety: Goal: Ability to remain free from injury will improve Outcome: Progressing   Problem: Skin Integrity: Goal: Risk for impaired skin integrity will decrease Outcome: Progressing   Problem: Education: Goal: Ability to describe self-care measures that may prevent or decrease complications (Diabetes  Survival Skills Education) will improve Outcome: Progressing Goal: Individualized Educational Video(s) Outcome: Progressing   Problem: Coping: Goal: Ability to adjust to condition or change in health will improve Outcome: Progressing   Problem: Fluid Volume: Goal: Ability to maintain a balanced intake and output will improve Outcome: Progressing   Problem: Health Behavior/Discharge Planning: Goal: Ability to identify and utilize available resources and services will improve Outcome: Progressing Goal: Ability to manage health-related needs will improve Outcome: Progressing   Problem: Metabolic: Goal: Ability to maintain appropriate glucose levels will improve Outcome: Progressing   Problem: Nutritional: Goal: Maintenance of adequate nutrition will improve Outcome: Progressing Goal: Progress toward achieving an optimal weight will improve Outcome: Progressing   Problem: Skin Integrity: Goal: Risk for impaired skin integrity will decrease Outcome: Progressing   Problem: Tissue Perfusion: Goal: Adequacy of tissue perfusion will improve Outcome: Progressing

## 2023-01-04 NOTE — Inpatient Diabetes Management (Signed)
Inpatient Diabetes Program Recommendations  AACE/ADA: New Consensus Statement on Inpatient Glycemic Control (2015)  Target Ranges:  Prepandial:   less than 140 mg/dL      Peak postprandial:   less than 180 mg/dL (1-2 hours)      Critically ill patients:  140 - 180 mg/dL   Lab Results  Component Value Date   GLUCAP 269 (H) 01/04/2023   HGBA1C 7.7 12/13/2022    Latest Reference Range & Units 01/03/23 08:18 01/03/23 13:30 01/03/23 15:58 01/03/23 20:55 01/04/23 06:16  Glucose-Capillary 70 - 99 mg/dL 161 (H) 096 (H) 045 (H) 195 (H) 269 (H)  (H): Data is abnormally high  Diabetes history: DM2 Outpatient Diabetes medications: Lantus 25 units, Amaryl 4 mg bid, Jardiance 10 mg qday, Metformin 850 mg. Bid. Current orders for Inpatient glycemic control: -Semglee 20 units every day -Novolog 0-9 units tid, 0-5 units hs correction, Decadron 6 mg qd  Inpatient Diabetes Program Recommendations:   While on current Decadron 6 mg every day, please consider: -Increase Novolog correction to 0-15 units tid, 0-5 units hs  Thank you, Darel Hong E. Cadon Raczka, RN, MSN, CDE  Diabetes Coordinator Inpatient Glycemic Control Team Team Pager [phone] (8am-5pm) 01/04/2023 11:17 AM

## 2023-01-04 NOTE — Progress Notes (Signed)
Mobility Specialist Progress Note:    01/04/23 1633  Mobility  Activity Ambulated with assistance in room  Level of Assistance Contact guard assist, steadying assist  Assistive Device None  Distance Ambulated (ft) 60 ft  Activity Response Tolerated well  Mobility Referral Yes  $Mobility charge 1 Mobility  Mobility Specialist Start Time (ACUTE ONLY) 1518  Mobility Specialist Stop Time (ACUTE ONLY) 1536  Mobility Specialist Time Calculation (min) (ACUTE ONLY) 18 min   Received pt in bed having no complaints and agreeable to mobility. Pt was asymptomatic throughout ambulation, no SOB, SPO2 stayed between 93%-96% on RA. Returned to room w/o fault. Left in bed w/ call bell in reach and all needs met.   Thompson Grayer Mobility Specialist  Please contact vis Secure Chat or  Rehab Office [phone]

## 2023-01-04 NOTE — Progress Notes (Signed)
PROGRESS NOTE    Kaitlin Cantrell  OZH:[id number] DOB: 13-Oct-1955 DOA: 01/02/2023 PCP: [name], MD    Brief Narrative:   Kaitlin Cantrell is a 67 y.o. female with past medical history significant for DM2, HTN, HLD, atrial flutter on Xarelto, chronic diastolic congestive heart failure, history of PE 2017, COPD, NICM, chronic pain syndrome, CKD stage IIIb, history of COVID infection 2021, GERD who presented to [facility] ED on 9/2 via EMS for altered mental status.  Patient was found unresponsive by family.  On EMS arrival, glucose was noted to be 49 and patient was given 25 g of D10 with proven of glucose to 118; in addition to improvement of her mental status.  Patient also endorsed shortness of breath and cough.  She reports she has been feeling ill for the past week with also associated fatigue and poor oral intake.  She currently is on Lantus 25 units daily, metformin and glimepiride for diabetes; patient was transported by EMS to the ED for further evaluation and management.  In the ED, temperature 97.6 3 Fahrenheit, HR 63, RR 15, BP 121/51, SpO2 96% on room air.  WBC 14.1, hemoglobin 10.4, platelet count 401.  Sodium 122, potassium 4.7, chloride 85, CO2 21, glucose 120, BUN 51, creatinine 2.75.  AST 138, ALT 76, total bilirubin 0.9.  BNP 1021.6.  SARS-CoV-2 positive.  Chest x-ray with diffuse interstitial coarsening with patchy opacity at the bases suspicious for multifocal pneumonia including atypical viral infection, less likely consideration for pulmonary edema.  EKG with normal sinus rhythm, no concerning ST elevation/depressions or T wave inversions.  Patient received ceftriaxone, azithromycin, 1 L LR bolus in the ED.  TRH consulted for admission for further evaluation management of hypoglycemia, COVID viral infection, hyponatremia.  Assessment & Plan:   Multifocal pneumonia COVID-19 virus infection Patient presenting to the ED with shortness of breath, cough.  Mild  leukocytosis, no fever or signs of sepsis.  Not hypoxic.  Patient was given ceftriaxone and azithromycin in the ED. etiology likely secondary to COVID viral infection. -- Continue remdesivir -- Decadron 6 mg IV every 24 hours x 10 days -- Airborne/contact isolation precautions -- Continue supplemental oxygen, maintain SpO2 greater than 88%, on 1 L nasal cannula -- Supportive care  Acute COPD exacerbation In the setting of COVID infection.  She has diffuse rhonchi on exam but no hypoxia or respiratory distress.  High risk allergy to albuterol (history of anaphylaxis).  -- Decadron 6 mg IV every 24 hours as above -- Breo Ellipta 1 puff daily (substituted for home Symbicort) -- Atrovent neb every 6 hours as needed wheezing/shortness of breath   Hypoglycemia: Resolved Insulin-dependent type 2 diabetes Hypoglycemic in the setting poor p.o. intake due to acute viral illness.  Current home meds include Lantus 25 units daily, glimepiride, metformin, and Jardiance.  A1c 7.7 on 12/13/2022.  Glucose initially in the 40s, improved after IV dextrose given by EMS.  No recurrence of hypoglycemia in the ED.   -- Semglee 20 units subcutaneously daily -- Holding home metformin/glimepiride -- SSI for coverage -- CBGs qAC/HS   Altered mental status: Resolved Likely related to hypoglycemia as her mental status improved immediately after administration of IV dextrose by EMS.  She is currently AAO x 4 and answering questions appropriately.  No focal neurodeficit on exam.   Hyponatremia Likely related to poor p.o. intake in the setting of acute viral illness.  Sodium 122 on admission, was 136-140 on labs done in May  2024.   -- NS at 75 mL/h -- repeat BMP q12h  AKI on CKD stage IIIb Likely prerenal azotemia from dehydration.  She has dry mucous membranes.  BUN 51, creatinine 2.7 (baseline 1.2-1.4).  Continue gentle IV fluid hydration and monitor renal function.  Avoid nephrotoxic agents/hold home Entresto and  Lasix. -- Cr  2.75>>2.68>1.77 -- NS at 75 mL/h -- BMP q12h   Transaminitis Possibly due to acute viral infection.  Patient has no abdominal pain/tenderness, nausea, or vomiting.   -- AST 138>86>47 -- ALT 76>62>50 -- Tbili 0.9>0.6 -- Hold home statin -- Repeat LFTs in a.m.  Hypokalemia Potassium 5.7, etiology likely secondary to acute renal failure as above. -- Lokelma 10 g p.o. x 2 today -- BMP in a.m.   Chronic HFpEF Mild MVR Essential hypertension Echo done in February 2024 showing EF 60 to 65%, mild MVR.  She has pedal edema on exam but no rales on auscultation of the lungs.  Chest x-ray more concerning for multifocal pneumonia rather than pulmonary edema.  -- Metoprolol succinate 20 mg p.o. daily -- Holding furosemide, Entresto secondary to volume depletion and borderline hypotension   Hyperlipidemia Hold statin until LFTs improved.   Paroxysmal atrial flutter Currently in sinus rhythm.  -- Metoprolol succinate 200 mg p.o. daily -- Continue Xarelto   GERD -- Continue PPI  Chronic pain syndrome -- Tylenol 650 mg p.o. every 6 hours as needed mild pain/fever/headache -- Flexeril 10 mg p.o. 3 times daily as needed muscle spasms -- Gabapentin 40 mg p.o. 3 times daily   DVT prophylaxis:  rivaroxaban (XARELTO) tablet 20 mg    Code Status: Full Code Family Communication: No family present at bedside this morning  Disposition Plan:  Level of care: Progressive Status is: Inpatient Remains inpatient appropriate because: IV steroids, IV remdesivir, IVF, need to wean off of supplemental oxygen    Consultants:  None  Procedures:  None  Antimicrobials:  Azithromycin 9/2 - 9/2 Ceftriaxone 9/2 - 9/2   Subjective: Patient seen examined bedside, resting calmly.  Sitting at edge of bed.  Placed on oxygen overnight for desaturation, currently on 1 L nasal cannula with SpO2 98% at rest.  Reports dyspnea improving.  No other specific questions or concerns at this time.   Denies headache, no visual changes, no chest pain, no palpitations, no fever/chills/night sweats, no nausea/vomiting/diarrhea, no focal weakness, no productive cough, no paresthesias.  No acute events overnight per nursing staff.  Objective: Vitals:   01/03/23 1550 01/03/23 2052 01/04/23 0556 01/04/23 0925  BP: (!) 161/85 (!) 156/80 (!) 178/84   Pulse: 85  82 72  Resp: (!) 22 20 18    Temp: 97.8 F (36.6 C) 98.2 F (36.8 C) (!) 97.5 F (36.4 C)   TempSrc: Oral Oral Oral   SpO2: 91%  96%   Weight:   124.7 kg   Height:        Intake/Output Summary (Last 24 hours) at 01/04/2023 1019 Last data filed at 01/04/2023 0112 Gross per 24 hour  Intake 853.36 ml  Output --  Net 853.36 ml   Filed Weights   01/02/23 1659 01/02/23 2113 01/04/23 0556  Weight: 115.2 kg 122.4 kg 124.7 kg    Examination:  Physical Exam: GEN: NAD, alert and oriented x 3, wd/wn HEENT: NCAT, PERRL, EOMI, sclera clear, dry mucous membranes PULM: Breath sounds slight diminished bilateral bases with rhonchi, no wheezing, normal respiratory effort without accessory muscle use, on 1 L nasal cannula with SpO2 98% at rest  CV: RRR w/o M/G/R GI: abd soft, NTND, NABS, no R/G/M MSK: no peripheral edema, muscle strength globally intact 5/5 bilateral upper/lower extremities NEURO: CN II-XII intact, no focal deficits, sensation to light touch intact PSYCH: normal mood/affect Integumentary: dry/intact, no rashes or wounds    Data Reviewed: I have personally reviewed following labs and imaging studies  CBC: Recent Labs  Lab 01/02/23 1718 01/03/23 0447 01/04/23 0547  WBC 14.1* 9.7 10.0  NEUTROABS 11.4*  --   --   HGB 10.4* 10.4* 11.7*  HCT 34.6* 33.2* 37.9  MCV 84.2 82.6 85.7  PLT 401* 385 367   Basic Metabolic Panel: Recent Labs  Lab 01/02/23 1718 01/02/23 2124 01/03/23 0447 01/03/23 2030 01/04/23 0547  NA 122* 127* 123* 122* 122*  K 4.7 5.2* 5.3* 5.5* 5.7*  CL 85* 89* 89* 87* 89*  CO2 21* 24 22 20* 20*   GLUCOSE 120* 127* 238* 188* 281*  BUN 51* 51* 52* 52* 51*  CREATININE 2.75* 2.66* 2.68* 2.00* 1.77*  CALCIUM 8.5* 8.6* 8.1* 8.6* 8.8*   GFR: Estimated Creatinine Clearance: 42.8 mL/min (A) (by C-G formula based on SCr of 1.77 mg/dL (H)). Liver Function Tests: Recent Labs  Lab 01/02/23 1718 01/03/23 0447 01/04/23 0547  AST 138* 86* 47*  ALT 76* 62* 50*  ALKPHOS 84 81 86  BILITOT 0.9 0.6 0.6  PROT 6.5 6.3* 6.4*  ALBUMIN 2.6* 2.3* 2.6*   No results for input(s): "LIPASE", "AMYLASE" in the last 168 hours. No results for input(s): "AMMONIA" in the last 168 hours. Coagulation Profile: No results for input(s): "INR", "PROTIME" in the last 168 hours. Cardiac Enzymes: No results for input(s): "CKTOTAL", "CKMB", "CKMBINDEX", "TROPONINI" in the last 168 hours. BNP (last 3 results) No results for input(s): "PROBNP" in the last 8760 hours. HbA1C: No results for input(s): "HGBA1C" in the last 72 hours. CBG: Recent Labs  Lab 01/03/23 0818 01/03/23 1330 01/03/23 1558 01/03/23 2055 01/04/23 0616  GLUCAP 227* 252* 221* 195* 269*   Lipid Profile: No results for input(s): "CHOL", "HDL", "LDLCALC", "TRIG", "CHOLHDL", "LDLDIRECT" in the last 72 hours. Thyroid Function Tests: No results for input(s): "TSH", "T4TOTAL", "FREET4", "T3FREE", "THYROIDAB" in the last 72 hours. Anemia Panel: No results for input(s): "VITAMINB12", "FOLATE", "FERRITIN", "TIBC", "IRON", "RETICCTPCT" in the last 72 hours. Sepsis Labs: Recent Labs  Lab 01/03/23 0447  PROCALCITON 0.40    Recent Results (from the past 240 hour(s))  SARS Coronavirus 2 by RT PCR (hospital order, performed in [facility] hospital lab) *cepheid single result test* Anterior Nasal Swab     Status: Abnormal   Collection Time: 01/02/23  6:10 PM   Specimen: Anterior Nasal Swab  Result Value Ref Range Status   SARS Coronavirus 2 by RT PCR POSITIVE (A) NEGATIVE Final    Comment: Performed at [facility], [address]  [place], [place] [address]  Blood culture (routine x 2)     Status: None (Preliminary result)   Collection Time: 01/02/23  7:49 PM   Specimen: BLOOD RIGHT HAND  Result Value Ref Range Status   Specimen Description BLOOD RIGHT HAND  Final   Special Requests   Final    BOTTLES DRAWN AEROBIC AND ANAEROBIC Blood Culture results may not be optimal due to an inadequate volume of blood received in culture bottles   Culture   Final    NO GROWTH 2 DAYS Performed at [facility], [address], [place], [place] [address]    Report Status PENDING  Incomplete  Blood culture (routine x 2)     Status: None (Preliminary result)   Collection Time: 01/02/23  7:49 PM   Specimen: BLOOD LEFT HAND  Result Value Ref Range Status   Specimen Description BLOOD LEFT HAND  Final   Special Requests   Final    BOTTLES DRAWN AEROBIC AND ANAEROBIC Blood Culture adequate volume   Culture   Final    NO GROWTH 2 DAYS Performed at [facility], [address], [place], [place] [address]    Report Status PENDING  Incomplete         Radiology Studies: DG Chest Portable 1 View  Result Date: 01/02/2023 CLINICAL DATA:  Cough and fever. EXAM: PORTABLE CHEST 1 VIEW COMPARISON:  01/16/2020, chest CT 02/28/2022 FINDINGS: Upper normal heart size. Diffuse interstitial coarsening with patchy opacity at the bases. No pneumothorax or significant pleural effusion. No acute osseous findings. IMPRESSION: Diffuse interstitial coarsening with patchy opacity at the bases, suspicious for multifocal pneumonia, including atypical viral infection. Less likely consideration pulmonary edema. Electronically Signed   By: Narda Rutherford M.D.   On: 01/02/2023 18:55        Scheduled Meds:  dexamethasone (DECADRON) injection  6 mg Intravenous Q24H   dextromethorphan-guaiFENesin  1 tablet Oral BID   fluticasone furoate-vilanterol  1 puff Inhalation Daily   gabapentin  400 mg Oral TID   insulin aspart  0-5 Units Subcutaneous QHS    insulin aspart  0-9 Units Subcutaneous TID WC   insulin glargine-yfgn  20 Units Subcutaneous Daily   metoprolol  200 mg Oral Daily   pantoprazole  80 mg Oral Daily   [START ON 01/05/2023] rivaroxaban  20 mg Oral Q breakfast   sodium zirconium cyclosilicate  10 g Oral BID   Continuous Infusions:  sodium chloride 75 mL/hr at 01/04/23 6644   remdesivir 100 mg in sodium chloride 0.9 % 100 mL IVPB 100 mg (01/04/23 0112)     LOS: 2 days    Time spent: 51 minutes spent on chart review, discussion with nursing staff, consultants, updating family and interview/physical exam; more than 50% of that time was spent in counseling and/or coordination of care.    Alvira Philips [place], DO [facility] Available via Epic secure chat 7am-7pm After these hours, please refer to coverage provider listed on [email or web address] 01/04/2023, 10:19 AM

## 2023-01-05 DIAGNOSIS — N179 Acute kidney failure, unspecified: Secondary | ICD-10-CM | POA: Insufficient documentation

## 2023-01-05 DIAGNOSIS — U071 COVID-19: Secondary | ICD-10-CM | POA: Diagnosis not present

## 2023-01-05 DIAGNOSIS — G9341 Metabolic encephalopathy: Secondary | ICD-10-CM | POA: Insufficient documentation

## 2023-01-05 DIAGNOSIS — E875 Hyperkalemia: Secondary | ICD-10-CM | POA: Insufficient documentation

## 2023-01-05 DIAGNOSIS — N1832 Chronic kidney disease, stage 3b: Secondary | ICD-10-CM | POA: Insufficient documentation

## 2023-01-05 DIAGNOSIS — J1282 Pneumonia due to coronavirus disease 2019: Secondary | ICD-10-CM | POA: Diagnosis not present

## 2023-01-05 LAB — CBC
HCT: 31.6 % — ABNORMAL LOW (ref 36.0–46.0)
Hemoglobin: 9.7 g/dL — ABNORMAL LOW (ref 12.0–15.0)
MCH: 25.9 pg — ABNORMAL LOW (ref 26.0–34.0)
MCHC: 30.7 g/dL (ref 30.0–36.0)
MCV: 84.5 fL (ref 80.0–100.0)
Platelets: 336 10*3/uL (ref 150–400)
RBC: 3.74 MIL/uL — ABNORMAL LOW (ref 3.87–5.11)
RDW: 15.2 % (ref 11.5–15.5)
WBC: 13.9 10*3/uL — ABNORMAL HIGH (ref 4.0–10.5)
nRBC: 0.1 % (ref 0.0–0.2)

## 2023-01-05 LAB — BASIC METABOLIC PANEL
Anion gap: 11 (ref 5–15)
BUN: 53 mg/dL — ABNORMAL HIGH (ref 8–23)
CO2: 22 mmol/L (ref 22–32)
Calcium: 9.2 mg/dL (ref 8.9–10.3)
Chloride: 94 mmol/L — ABNORMAL LOW (ref 98–111)
Creatinine, Ser: 1.66 mg/dL — ABNORMAL HIGH (ref 0.44–1.00)
GFR, Estimated: 34 mL/min — ABNORMAL LOW (ref >60)
Glucose, Bld: 276 mg/dL — ABNORMAL HIGH (ref 70–99)
Potassium: 6 mmol/L — ABNORMAL HIGH (ref 3.5–5.1)
Sodium: 127 mmol/L — ABNORMAL LOW (ref 135–145)

## 2023-01-05 LAB — GLUCOSE, CAPILLARY
Glucose-Capillary: 286 mg/dL — ABNORMAL HIGH (ref 70–99)
Glucose-Capillary: 292 mg/dL — ABNORMAL HIGH (ref 70–99)
Glucose-Capillary: 335 mg/dL — ABNORMAL HIGH (ref 70–99)

## 2023-01-05 LAB — COMPREHENSIVE METABOLIC PANEL
ALT: 32 U/L (ref 0–44)
AST: 21 U/L (ref 15–41)
Albumin: 2.6 g/dL — ABNORMAL LOW (ref 3.5–5.0)
Alkaline Phosphatase: 72 U/L (ref 38–126)
Anion gap: 10 (ref 5–15)
BUN: 53 mg/dL — ABNORMAL HIGH (ref 8–23)
CO2: 20 mmol/L — ABNORMAL LOW (ref 22–32)
Calcium: 8.9 mg/dL (ref 8.9–10.3)
Chloride: 94 mmol/L — ABNORMAL LOW (ref 98–111)
Creatinine, Ser: 1.69 mg/dL — ABNORMAL HIGH (ref 0.44–1.00)
GFR, Estimated: 33 mL/min — ABNORMAL LOW (ref >60)
Glucose, Bld: 320 mg/dL — ABNORMAL HIGH (ref 70–99)
Potassium: 6.3 mmol/L (ref 3.5–5.1)
Sodium: 124 mmol/L — ABNORMAL LOW (ref 135–145)
Total Bilirubin: 0.5 mg/dL (ref 0.3–1.2)
Total Protein: 6.1 g/dL — ABNORMAL LOW (ref 6.5–8.1)

## 2023-01-05 LAB — POTASSIUM: Potassium: 5.1 mmol/L (ref 3.5–5.1)

## 2023-01-05 MED ORDER — FUROSEMIDE 10 MG/ML IJ SOLN
40.0000 mg | Freq: Once | INTRAMUSCULAR | Status: AC
  Administered 2023-01-05: 40 mg via INTRAVENOUS
  Filled 2023-01-05: qty 4

## 2023-01-05 MED ORDER — CALCIUM GLUCONATE-NACL 1-0.675 GM/50ML-% IV SOLN
1.0000 g | Freq: Once | INTRAVENOUS | Status: AC
  Administered 2023-01-05: 1000 mg via INTRAVENOUS
  Filled 2023-01-05: qty 50

## 2023-01-05 MED ORDER — SODIUM ZIRCONIUM CYCLOSILICATE 10 G PO PACK
10.0000 g | PACK | Freq: Two times a day (BID) | ORAL | Status: AC
  Administered 2023-01-05 – 2023-01-06 (×2): 10 g via ORAL
  Filled 2023-01-05 (×2): qty 1

## 2023-01-05 MED ORDER — INSULIN ASPART 100 UNIT/ML IV SOLN
10.0000 [IU] | Freq: Once | INTRAVENOUS | Status: AC
  Administered 2023-01-05: 10 [IU] via INTRAVENOUS

## 2023-01-05 MED ORDER — ATORVASTATIN CALCIUM 80 MG PO TABS
80.0000 mg | ORAL_TABLET | Freq: Every day | ORAL | Status: DC
  Administered 2023-01-05 – 2023-01-08 (×4): 80 mg via ORAL
  Filled 2023-01-05 (×4): qty 1

## 2023-01-05 NOTE — Progress Notes (Signed)
PROGRESS NOTE    Kaitlin Cantrell  WJX:[id number] DOB: 1956/04/08 DOA: 01/02/2023 PCP: [name], MD     Brief Narrative:  Kaitlin Cantrell is a 67 y.o. female with past medical history significant for DM2, HTN, HLD, atrial flutter on Xarelto, chronic diastolic congestive heart failure, history of PE 2017, COPD, NICM, chronic pain syndrome, CKD stage IIIb, history of COVID infection 2021, GERD who presented to [facility] ED on 9/2 via EMS for altered mental status.  Patient was found unresponsive by family.  On EMS arrival, glucose was noted to be 49 and patient was given 25 g of D10 with proven of glucose to 118; in addition to improvement of her mental status.  Patient also endorsed shortness of breath and cough.  She reports she has been feeling ill for the past week with also associated fatigue and poor oral intake.  She currently is on Lantus 25 units daily, metformin and glimepiride for diabetes; patient was transported by EMS to the ED for further evaluation and management.   In the ED, temperature 97.6 3 Fahrenheit, HR 63, RR 15, BP 121/51, SpO2 96% on room air.  WBC 14.1, hemoglobin 10.4, platelet count 401.  Sodium 122, potassium 4.7, chloride 85, CO2 21, glucose 120, BUN 51, creatinine 2.75.  AST 138, ALT 76, total bilirubin 0.9.  BNP 1021.6.  SARS-CoV-2 positive.  Chest x-ray with diffuse interstitial coarsening with patchy opacity at the bases suspicious for multifocal pneumonia including atypical viral infection, less likely consideration for pulmonary edema.  EKG with normal sinus rhythm, no concerning ST elevation/depressions or T wave inversions.  Patient received ceftriaxone, azithromycin, 1 L LR bolus in the ED.  TRH consulted for admission for further evaluation management of hypoglycemia, COVID viral infection, hyponatremia.  New events last 24 hours / Subjective: Patient feeling well overall this morning. No complaints on exam. Remains on room air.   Assessment &  Plan:   Principal Problem:   Pneumonia due to COVID-19 virus Active Problems:   Hypertension associated with type 2 diabetes mellitus (HCC)   Hyperlipidemia associated with type 2 diabetes mellitus (HCC)   Chronic pain syndrome   Chronic obstructive pulmonary disease (HCC)   Stage 3 chronic kidney disease (HCC)   Paroxysmal atrial fibrillation (HCC)   BMI 40.0-44.9, adult (HCC)   Hyperkalemia   Acute metabolic encephalopathy   AKI (acute kidney injury) (HCC)   Multifocal pneumonia COVID-19 virus infection Patient presenting to the ED with shortness of breath, cough.  Mild leukocytosis, no fever or signs of sepsis.  Not hypoxic.  Patient was given ceftriaxone and azithromycin in the ED. etiology likely secondary to COVID viral infection. -- Completed Remdesivir -- Decadron 6 mg IV every 24 hours x 10 days -- Airborne/contact isolation precautions  Hyperkalemia -EKG reviewed independently, without peaked T wave.  Order calcium, Lasix, insulin.  Repeat potassium levels today   Acute COPD exacerbation In the setting of COVID infection.  She has diffuse rhonchi on exam but no hypoxia or respiratory distress.  High risk allergy to albuterol (history of anaphylaxis).  -- Decadron 6 mg IV every 24 hours as above -- Breo Ellipta 1 puff daily (substituted for home Symbicort) -- Atrovent neb every 6 hours as needed wheezing/shortness of breath   Hypoglycemia: Resolved Insulin-dependent type 2 diabetes Hypoglycemic in the setting poor p.o. intake due to acute viral illness.  Current home meds include Lantus 25 units daily, glimepiride, metformin, and Jardiance.  A1c 7.7 on 12/13/2022.  Glucose initially in the 40s, improved after IV dextrose given by EMS.  No recurrence of hypoglycemia in the ED.   -- Semglee, SSI    Altered mental status: Resolved Likely related to hypoglycemia as her mental status improved immediately after administration of IV dextrose by EMS.  She is currently AAO x 4  and answering questions appropriately.  No focal neurodeficit on exam.   Hyponatremia Likely related to poor p.o. intake in the setting of acute viral illness.    AKI on CKD stage IIIb Likely prerenal azotemia from dehydration.  She has dry mucous membranes.  BUN 51, creatinine 2.7 (baseline 1.2-1.4).  Continue gentle IV fluid hydration and monitor renal function.  Avoid nephrotoxic agents/hold home Entresto and Lasix. -- Improving   Transaminitis Possibly due to acute viral infection.  Patient has no abdominal pain/tenderness, nausea, or vomiting.   -- Resolved   Chronic HFpEF Mild MVR Essential hypertension Echo done in February 2024 showing EF 60 to 65%, mild MVR.  She has pedal edema on exam but no rales on auscultation of the lungs.  Chest x-ray more concerning for multifocal pneumonia rather than pulmonary edema.  -- Metoprolol succinate 20 mg p.o. daily   Hyperlipidemia -- Resume statin   Paroxysmal atrial flutter Currently in sinus rhythm.  -- Metoprolol succinate 200 mg p.o. daily -- Continue Xarelto   GERD -- Continue PPI   Chronic pain syndrome -- Tylenol 650 mg p.o. every 6 hours as needed mild pain/fever/headache -- Flexeril 10 mg p.o. 3 times daily as needed muscle spasms -- Gabapentin 40 mg p.o. 3 times daily    DVT prophylaxis:  rivaroxaban (XARELTO) tablet 20 mg  Code Status: Full code Family Communication: None Disposition Plan: Home Status is: Inpatient Remains inpatient appropriate because: Hyperkalemia    Antimicrobials:  Anti-infectives (From admission, onward)    Start     Dose/Rate Route Frequency Ordered Stop   01/03/23 2000  remdesivir 100 mg in sodium chloride 0.9 % 100 mL IVPB       Placed in "Followed by" Linked Group   100 mg 200 mL/hr over 30 Minutes Intravenous Daily 01/02/23 2038 01/04/23 2151   01/02/23 2100  remdesivir 200 mg in sodium chloride 0.9% 250 mL IVPB       Placed in "Followed by" Linked Group   200 mg 580 mL/hr  over 30 Minutes Intravenous Once 01/02/23 2038 01/02/23 2321   01/02/23 1945  cefTRIAXone (ROCEPHIN) 1 g in sodium chloride 0.9 % 100 mL IVPB        1 g 200 mL/hr over 30 Minutes Intravenous  Once 01/02/23 1936 01/02/23 2214   01/02/23 1915  azithromycin (ZITHROMAX) 500 mg in sodium chloride 0.9 % 250 mL IVPB        500 mg 250 mL/hr over 60 Minutes Intravenous  Once 01/02/23 1905 01/02/23 2048        Objective: Vitals:   01/05/23 0429 01/05/23 0820 01/05/23 0844 01/05/23 1157  BP: (!) 180/83 (!) 154/79  (!) 185/82  Pulse: (!) 59  84   Resp: 15   18  Temp: 97.9 F (36.6 C) 97.7 F (36.5 C)  97.6 F (36.4 C)  TempSrc: Oral Oral  Oral  SpO2: 90%     Weight:      Height:        Intake/Output Summary (Last 24 hours) at 01/05/2023 1556 Last data filed at 01/05/2023 1551 Gross per 24 hour  Intake 1428.42 ml  Output --  Net 1428.42  ml   Filed Weights   01/02/23 1659 01/02/23 2113 01/04/23 0556  Weight: 115.2 kg 122.4 kg 124.7 kg    Examination:  General exam: Appears calm and comfortable  Respiratory system: Clear to auscultation. Respiratory effort normal. No respiratory distress. No conversational dyspnea.  Cardiovascular system: S1 & S2 heard, RRR. No murmurs. No pedal edema. Gastrointestinal system: Abdomen is nondistended, soft and nontender. Normal bowel sounds heard. Central nervous system: Alert and oriented. No focal neurological deficits. Speech clear.  Extremities: Symmetric in appearance  Skin: No rashes, lesions or ulcers on exposed skin  Psychiatry: Judgement and insight appear normal. Mood & affect appropriate.   Data Reviewed: I have personally reviewed following labs and imaging studies  CBC: Recent Labs  Lab 01/02/23 1718 01/03/23 0447 01/04/23 0547 01/05/23 1139  WBC 14.1* 9.7 10.0 13.9*  NEUTROABS 11.4*  --   --   --   HGB 10.4* 10.4* 11.7* 9.7*  HCT 34.6* 33.2* 37.9 31.6*  MCV 84.2 82.6 85.7 84.5  PLT 401* 385 367 336   Basic Metabolic  Panel: Recent Labs  Lab 01/03/23 0447 01/03/23 2030 01/04/23 0547 01/04/23 1830 01/05/23 1139  NA 123* 122* 122* 126* 124*  K 5.3* 5.5* 5.7* 5.3* 6.3*  CL 89* 87* 89* 93* 94*  CO2 22 20* 20* 22 20*  GLUCOSE 238* 188* 281* 168* 320*  BUN 52* 52* 51* 52* 53*  CREATININE 2.68* 2.00* 1.77* 1.93* 1.69*  CALCIUM 8.1* 8.6* 8.8* 9.2 8.9   GFR: Estimated Creatinine Clearance: 44.8 mL/min (A) (by C-G formula based on SCr of 1.69 mg/dL (H)). Liver Function Tests: Recent Labs  Lab 01/02/23 1718 01/03/23 0447 01/04/23 0547 01/05/23 1139  AST 138* 86* 47* 21  ALT 76* 62* 50* 32  ALKPHOS 84 81 86 72  BILITOT 0.9 0.6 0.6 0.5  PROT 6.5 6.3* 6.4* 6.1*  ALBUMIN 2.6* 2.3* 2.6* 2.6*   No results for input(s): "LIPASE", "AMYLASE" in the last 168 hours. No results for input(s): "AMMONIA" in the last 168 hours. Coagulation Profile: No results for input(s): "INR", "PROTIME" in the last 168 hours. Cardiac Enzymes: No results for input(s): "CKTOTAL", "CKMB", "CKMBINDEX", "TROPONINI" in the last 168 hours. BNP (last 3 results) No results for input(s): "PROBNP" in the last 8760 hours. HbA1C: No results for input(s): "HGBA1C" in the last 72 hours. CBG: Recent Labs  Lab 01/04/23 2127 01/04/23 2342 01/05/23 0538 01/05/23 0817 01/05/23 1157  GLUCAP 229* 249* 286* 292* 335*   Lipid Profile: No results for input(s): "CHOL", "HDL", "LDLCALC", "TRIG", "CHOLHDL", "LDLDIRECT" in the last 72 hours. Thyroid Function Tests: No results for input(s): "TSH", "T4TOTAL", "FREET4", "T3FREE", "THYROIDAB" in the last 72 hours. Anemia Panel: No results for input(s): "VITAMINB12", "FOLATE", "FERRITIN", "TIBC", "IRON", "RETICCTPCT" in the last 72 hours. Sepsis Labs: Recent Labs  Lab 01/03/23 0447  PROCALCITON 0.40    Recent Results (from the past 240 hour(s))  SARS Coronavirus 2 by RT PCR (hospital order, performed in [facility] hospital lab) *cepheid single result test* Anterior Nasal Swab      Status: Abnormal   Collection Time: 01/02/23  6:10 PM   Specimen: Anterior Nasal Swab  Result Value Ref Range Status   SARS Coronavirus 2 by RT PCR POSITIVE (A) NEGATIVE Final    Comment: Performed at [facility], [address] [place], [place] [address]  Blood culture (routine x 2)     Status: None (Preliminary result)   Collection Time: 01/02/23  7:49 PM   Specimen:  BLOOD RIGHT HAND  Result Value Ref Range Status   Specimen Description BLOOD RIGHT HAND  Final   Special Requests   Final    BOTTLES DRAWN AEROBIC AND ANAEROBIC Blood Culture results may not be optimal due to an inadequate volume of blood received in culture bottles   Culture   Final    NO GROWTH 3 DAYS Performed at [facility], [address], [place], [place] [address]    Report Status PENDING  Incomplete  Blood culture (routine x 2)     Status: None (Preliminary result)   Collection Time: 01/02/23  7:49 PM   Specimen: BLOOD LEFT HAND  Result Value Ref Range Status   Specimen Description BLOOD LEFT HAND  Final   Special Requests   Final    BOTTLES DRAWN AEROBIC AND ANAEROBIC Blood Culture adequate volume   Culture   Final    NO GROWTH 3 DAYS Performed at [facility], [address] [place], [place] [address]    Report Status PENDING  Incomplete      Radiology Studies: No results found.    Scheduled Meds:  atorvastatin  80 mg Oral Daily   dexamethasone (DECADRON) injection  6 mg Intravenous Q24H   dextromethorphan-guaiFENesin  1 tablet Oral BID   fluticasone furoate-vilanterol  1 puff Inhalation Daily   gabapentin  400 mg Oral TID   insulin aspart  0-15 Units Subcutaneous TID WC   insulin aspart  0-5 Units Subcutaneous QHS   insulin glargine-yfgn  20 Units Subcutaneous Daily   metoprolol  200 mg Oral Daily   pantoprazole  80 mg Oral Daily   rivaroxaban  20 mg Oral Q breakfast   Continuous Infusions:   LOS: 3 days   Time spent: 40 minutes   Kaitlin Stain, DO [facility] 01/05/2023, 3:56 PM   Available via Epic secure chat 7am-7pm After these hours, please refer to coverage provider listed on [email or web address]

## 2023-01-05 NOTE — Progress Notes (Signed)
Date and time results received: 01/05/23 at 1335 (use smartphrase ".now" to insert current time)  Test: POTASSIUM   Critical Value: 6.3  Name of Provider Notified: DR Alvino Chapel  Orders Received? Or Actions Taken?: STATES SHE WILL PLACE ORDERS

## 2023-01-05 NOTE — Care Management Important Message (Signed)
Important Message  Patient Details  Name: Kaitlin Cantrell MRN: [id number] Date of Birth: 1956/01/16   Medicare Important Message Given:  Yes     Renie Ora 01/05/2023, 8:58 AM

## 2023-01-06 DIAGNOSIS — U071 COVID-19: Secondary | ICD-10-CM | POA: Diagnosis not present

## 2023-01-06 DIAGNOSIS — J1282 Pneumonia due to coronavirus disease 2019: Secondary | ICD-10-CM | POA: Diagnosis not present

## 2023-01-06 LAB — CBC
HCT: 34.6 % — ABNORMAL LOW (ref 36.0–46.0)
Hemoglobin: 10.6 g/dL — ABNORMAL LOW (ref 12.0–15.0)
MCH: 25.2 pg — ABNORMAL LOW (ref 26.0–34.0)
MCHC: 30.6 g/dL (ref 30.0–36.0)
MCV: 82.2 fL (ref 80.0–100.0)
Platelets: 425 10*3/uL — ABNORMAL HIGH (ref 150–400)
RBC: 4.21 MIL/uL (ref 3.87–5.11)
RDW: 15.4 % (ref 11.5–15.5)
WBC: 14.1 10*3/uL — ABNORMAL HIGH (ref 4.0–10.5)
nRBC: 0 % (ref 0.0–0.2)

## 2023-01-06 LAB — BASIC METABOLIC PANEL
Anion gap: 8 (ref 5–15)
BUN: 52 mg/dL — ABNORMAL HIGH (ref 8–23)
CO2: 27 mmol/L (ref 22–32)
Calcium: 9.3 mg/dL (ref 8.9–10.3)
Chloride: 94 mmol/L — ABNORMAL LOW (ref 98–111)
Creatinine, Ser: 1.55 mg/dL — ABNORMAL HIGH (ref 0.44–1.00)
GFR, Estimated: 37 mL/min — ABNORMAL LOW (ref >60)
Glucose, Bld: 322 mg/dL — ABNORMAL HIGH (ref 70–99)
Potassium: 5.6 mmol/L — ABNORMAL HIGH (ref 3.5–5.1)
Sodium: 129 mmol/L — ABNORMAL LOW (ref 135–145)

## 2023-01-06 LAB — POTASSIUM: Potassium: 4.7 mmol/L (ref 3.5–5.1)

## 2023-01-06 LAB — GLUCOSE, CAPILLARY
Glucose-Capillary: 371 mg/dL — ABNORMAL HIGH (ref 70–99)
Glucose-Capillary: 229 mg/dL — ABNORMAL HIGH (ref 70–99)
Glucose-Capillary: 200 mg/dL — ABNORMAL HIGH (ref 70–99)

## 2023-01-06 MED ORDER — INSULIN GLARGINE-YFGN 100 UNIT/ML ~~LOC~~ SOLN
25.0000 [IU] | Freq: Every day | SUBCUTANEOUS | Status: DC
  Administered 2023-01-07 – 2023-01-08 (×2): 25 [IU] via SUBCUTANEOUS
  Filled 2023-01-06 (×2): qty 0.25

## 2023-01-06 MED ORDER — FUROSEMIDE 40 MG PO TABS
40.0000 mg | ORAL_TABLET | Freq: Every day | ORAL | Status: DC
  Administered 2023-01-06 – 2023-01-08 (×3): 40 mg via ORAL
  Filled 2023-01-06 (×3): qty 1

## 2023-01-06 MED ORDER — AMLODIPINE BESYLATE 10 MG PO TABS
10.0000 mg | ORAL_TABLET | Freq: Every day | ORAL | Status: DC
  Administered 2023-01-06 – 2023-01-07 (×2): 10 mg via ORAL
  Filled 2023-01-06 (×3): qty 1

## 2023-01-06 MED ORDER — INSULIN ASPART 100 UNIT/ML IJ SOLN
5.0000 [IU] | Freq: Three times a day (TID) | INTRAMUSCULAR | Status: DC
  Administered 2023-01-06 – 2023-01-07 (×3): 5 [IU] via SUBCUTANEOUS

## 2023-01-06 NOTE — Progress Notes (Signed)
Mobility Specialist Progress Note:    01/06/23 1229  Mobility  Activity Ambulated with assistance in hallway  Level of Assistance Contact guard assist, steadying assist  Assistive Device Other (Comment) (Hand Railings)  Distance Ambulated (ft) 250 ft  Activity Response Tolerated well  Mobility Referral Yes  $Mobility charge 1 Mobility  Mobility Specialist Start Time (ACUTE ONLY) 1210  Mobility Specialist Stop Time (ACUTE ONLY) 1225  Mobility Specialist Time Calculation (min) (ACUTE ONLY) 15 min   Received pt in bed having no complaints and agreeable to mobility. C/o mild SOB needed x2 standing breaks but SPO2, 92%, WFL. Returned to room w/o fault. Upon returning to room pt requested to use the BR, void complete, pericare performed independently. Left in bed w/ call bell in reach and all needs met.   Thompson Grayer Mobility Specialist  Please contact vis Secure Chat or  Rehab Office [phone]

## 2023-01-06 NOTE — Progress Notes (Signed)
CBG 315 at 0825. Results did not cross over into Epic.

## 2023-01-06 NOTE — Progress Notes (Addendum)
PROGRESS NOTE    Kaitlin Cantrell  QIO:[id number] DOB: March 01, 1956 DOA: 01/02/2023 PCP: [name], MD     Brief Narrative:  Kaitlin Cantrell is a 67 y.o. female with past medical history significant for DM2, HTN, HLD, atrial flutter on Xarelto, chronic diastolic congestive heart failure, history of PE 2017, COPD, NICM, chronic pain syndrome, CKD stage IIIb, history of COVID infection 2021, GERD who presented to [facility] ED on 9/2 via EMS for altered mental status.  Patient was found unresponsive by family.  On EMS arrival, glucose was noted to be 49 and patient was given 25 g of D10 with proven of glucose to 118; in addition to improvement of her mental status.  Patient also endorsed shortness of breath and cough.  She reports she has been feeling ill for the past week with also associated fatigue and poor oral intake.  She currently is on Lantus 25 units daily, metformin and glimepiride for diabetes; patient was transported by EMS to the ED for further evaluation and management.   In the ED, temperature 97.6 3 Fahrenheit, HR 63, RR 15, BP 121/51, SpO2 96% on room air.  WBC 14.1, hemoglobin 10.4, platelet count 401.  Sodium 122, potassium 4.7, chloride 85, CO2 21, glucose 120, BUN 51, creatinine 2.75.  AST 138, ALT 76, total bilirubin 0.9.  BNP 1021.6.  SARS-CoV-2 positive.  Chest x-ray with diffuse interstitial coarsening with patchy opacity at the bases suspicious for multifocal pneumonia including atypical viral infection, less likely consideration for pulmonary edema.  EKG with normal sinus rhythm, no concerning ST elevation/depressions or T wave inversions.  Patient received ceftriaxone, azithromycin, 1 L LR bolus in the ED.  TRH consulted for admission for further evaluation management of hypoglycemia, COVID viral infection, hyponatremia.  New events last 24 hours / Subjective: Patient without any complaints.  After her coughing episode, she did have an episode of desaturation,  quickly recovered.  Remains on room air.  Assessment & Plan:   Principal Problem:   Pneumonia due to COVID-19 virus Active Problems:   Hypertension associated with type 2 diabetes mellitus (HCC)   Hyperlipidemia associated with type 2 diabetes mellitus (HCC)   Chronic pain syndrome   Chronic obstructive pulmonary disease (HCC)   Stage 3 chronic kidney disease (HCC)   Paroxysmal atrial fibrillation (HCC)   BMI 40.0-44.9, adult (HCC)   Hyperkalemia   Acute metabolic encephalopathy   AKI (acute kidney injury) (HCC)   Multifocal pneumonia COVID-19 virus infection Patient presenting to the ED with shortness of breath, cough.  Mild leukocytosis, no fever or signs of sepsis.  Not hypoxic.  Patient was given ceftriaxone and azithromycin in the ED. etiology likely secondary to COVID viral infection. -- Completed Remdesivir -- Decadron 6 mg IV every 24 hours x 10 days -- Airborne/contact isolation precautions  Hyperkalemia -After calcium, Lasix, insulin, potassium level improved.  However, this morning bumped back up to 5.6.  Lokelma given today.  Patient states that due to [facility], her potassium level runs on the high side of normal.  She is not getting Entresto here.   Acute COPD exacerbation In the setting of COVID infection.  She has diffuse rhonchi on exam but no hypoxia or respiratory distress.  High risk allergy to albuterol (history of anaphylaxis).  -- Decadron 6 mg IV every 24 hours as above -- Breo Ellipta 1 puff daily (substituted for home Symbicort) -- Atrovent neb every 6 hours as needed wheezing/shortness of breath   Hypoglycemia: Resolved  Insulin-dependent type 2 diabetes Hypoglycemic in the setting poor p.o. intake due to acute viral illness.  Current home meds include Lantus 25 units daily, glimepiride, metformin, and Jardiance.  A1c 7.7 on 12/13/2022.  Glucose initially in the 40s, improved after IV dextrose given by EMS.  No recurrence of hypoglycemia in the ED.   --  Semglee, SSI.  Dose adjusted today   Altered mental status: Resolved Likely related to hypoglycemia as her mental status improved immediately after administration of IV dextrose by EMS.  She is currently AAO x 4 and answering questions appropriately.  No focal neurodeficit on exam.   Hyponatremia Likely related to poor p.o. intake in the setting of acute viral illness.  Stable   AKI on CKD stage IIIb Likely prerenal azotemia from dehydration.  She has dry mucous membranes.  BUN 51, creatinine 2.7 (baseline 1.2-1.4).  Continue gentle IV fluid hydration and monitor renal function.  Avoid nephrotoxic agents/hold home Entresto and Lasix. -- Improving   Transaminitis Possibly due to acute viral infection.  Patient has no abdominal pain/tenderness, nausea, or vomiting.   -- Resolved   Chronic HFpEF Mild MVR Essential hypertension Echo done in February 2024 showing EF 60 to 65%, mild MVR.  She has pedal edema on exam but no rales on auscultation of the lungs.  Chest x-ray more concerning for multifocal pneumonia rather than pulmonary edema.  -- Metoprolol succinate 20 mg p.o. daily -- Resume home Lasix   Hyperlipidemia -- Resume statin   Paroxysmal atrial flutter Currently in sinus rhythm.  -- Metoprolol succinate 200 mg p.o. daily -- Continue Xarelto   GERD -- Continue PPI   Chronic pain syndrome -- Tylenol 650 mg p.o. every 6 hours as needed mild pain/fever/headache -- Flexeril 10 mg p.o. 3 times daily as needed muscle spasms -- Gabapentin 40 mg p.o. 3 times daily    DVT prophylaxis:  rivaroxaban (XARELTO) tablet 20 mg  Code Status: Full code Family Communication: None Disposition Plan: Home Status is: Inpatient Remains inpatient appropriate because: Hyperkalemia    Antimicrobials:  Anti-infectives (From admission, onward)    Start     Dose/Rate Route Frequency Ordered Stop   01/03/23 2000  remdesivir 100 mg in sodium chloride 0.9 % 100 mL IVPB       Placed in  "Followed by" Linked Group   100 mg 200 mL/hr over 30 Minutes Intravenous Daily 01/02/23 2038 01/04/23 2151   01/02/23 2100  remdesivir 200 mg in sodium chloride 0.9% 250 mL IVPB       Placed in "Followed by" Linked Group   200 mg 580 mL/hr over 30 Minutes Intravenous Once 01/02/23 2038 01/02/23 2321   01/02/23 1945  cefTRIAXone (ROCEPHIN) 1 g in sodium chloride 0.9 % 100 mL IVPB        1 g 200 mL/hr over 30 Minutes Intravenous  Once 01/02/23 1936 01/02/23 2214   01/02/23 1915  azithromycin (ZITHROMAX) 500 mg in sodium chloride 0.9 % 250 mL IVPB        500 mg 250 mL/hr over 60 Minutes Intravenous  Once 01/02/23 1905 01/02/23 2048        Objective: Vitals:   01/06/23 0617 01/06/23 0812 01/06/23 0814 01/06/23 0847  BP: (!) 185/98  (!) 187/84   Pulse: 71   65  Resp: 20     Temp: 97.7 F (36.5 C)  97.6 F (36.4 C)   TempSrc: Oral  Oral   SpO2: 99% 94%    Weight:  Height:        Intake/Output Summary (Last 24 hours) at 01/06/2023 1234 Last data filed at 01/05/2023 2145 Gross per 24 hour  Intake 290 ml  Output 1500 ml  Net -1210 ml   Filed Weights   01/02/23 1659 01/02/23 2113 01/04/23 0556  Weight: 115.2 kg 122.4 kg 124.7 kg    Examination:  General exam: Appears calm and comfortable  Respiratory system: Clear to auscultation. Respiratory effort normal. No respiratory distress. No conversational dyspnea.  On room air Cardiovascular system: S1 & S2 heard, RRR. No murmurs. No pedal edema. Gastrointestinal system: Abdomen is nondistended, soft and nontender. Normal bowel sounds heard. Central nervous system: Alert and oriented. No focal neurological deficits. Speech clear.  Extremities: Symmetric in appearance  Skin: No rashes, lesions or ulcers on exposed skin  Psychiatry: Judgement and insight appear normal. Mood & affect appropriate.   Data Reviewed: I have personally reviewed following labs and imaging studies  CBC: Recent Labs  Lab 01/02/23 1718 01/03/23 0447  01/04/23 0547 01/05/23 1139 01/06/23 0756  WBC 14.1* 9.7 10.0 13.9* 14.1*  NEUTROABS 11.4*  --   --   --   --   HGB 10.4* 10.4* 11.7* 9.7* 10.6*  HCT 34.6* 33.2* 37.9 31.6* 34.6*  MCV 84.2 82.6 85.7 84.5 82.2  PLT 401* 385 367 336 425*   Basic Metabolic Panel: Recent Labs  Lab 01/04/23 0547 01/04/23 1830 01/05/23 1139 01/05/23 1440 01/05/23 2205 01/06/23 0756  NA 122* 126* 124* 127*  --  129*  K 5.7* 5.3* 6.3* 6.0* 5.1 5.6*  CL 89* 93* 94* 94*  --  94*  CO2 20* 22 20* 22  --  27  GLUCOSE 281* 168* 320* 276*  --  322*  BUN 51* 52* 53* 53*  --  52*  CREATININE 1.77* 1.93* 1.69* 1.66*  --  1.55*  CALCIUM 8.8* 9.2 8.9 9.2  --  9.3   GFR: Estimated Creatinine Clearance: 48.8 mL/min (A) (by C-G formula based on SCr of 1.55 mg/dL (H)). Liver Function Tests: Recent Labs  Lab 01/02/23 1718 01/03/23 0447 01/04/23 0547 01/05/23 1139  AST 138* 86* 47* 21  ALT 76* 62* 50* 32  ALKPHOS 84 81 86 72  BILITOT 0.9 0.6 0.6 0.5  PROT 6.5 6.3* 6.4* 6.1*  ALBUMIN 2.6* 2.3* 2.6* 2.6*   No results for input(s): "LIPASE", "AMYLASE" in the last 168 hours. No results for input(s): "AMMONIA" in the last 168 hours. Coagulation Profile: No results for input(s): "INR", "PROTIME" in the last 168 hours. Cardiac Enzymes: No results for input(s): "CKTOTAL", "CKMB", "CKMBINDEX", "TROPONINI" in the last 168 hours. BNP (last 3 results) No results for input(s): "PROBNP" in the last 8760 hours. HbA1C: No results for input(s): "HGBA1C" in the last 72 hours. CBG: Recent Labs  Lab 01/04/23 2127 01/04/23 2342 01/05/23 0538 01/05/23 0817 01/05/23 1157  GLUCAP 229* 249* 286* 292* 335*   Lipid Profile: No results for input(s): "CHOL", "HDL", "LDLCALC", "TRIG", "CHOLHDL", "LDLDIRECT" in the last 72 hours. Thyroid Function Tests: No results for input(s): "TSH", "T4TOTAL", "FREET4", "T3FREE", "THYROIDAB" in the last 72 hours. Anemia Panel: No results for input(s): "VITAMINB12", "FOLATE",  "FERRITIN", "TIBC", "IRON", "RETICCTPCT" in the last 72 hours. Sepsis Labs: Recent Labs  Lab 01/03/23 0447  PROCALCITON 0.40    Recent Results (from the past 240 hour(s))  SARS Coronavirus 2 by RT PCR (hospital order, performed in [facility] hospital lab) *cepheid single result test* Anterior Nasal Swab     Status: Abnormal  Collection Time: 01/02/23  6:10 PM   Specimen: Anterior Nasal Swab  Result Value Ref Range Status   SARS Coronavirus 2 by RT PCR POSITIVE (A) NEGATIVE Final    Comment: Performed at [facility] Lab, [address] [place], [place] [address]  Blood culture (routine x 2)     Status: None (Preliminary result)   Collection Time: 01/02/23  7:49 PM   Specimen: BLOOD RIGHT HAND  Result Value Ref Range Status   Specimen Description BLOOD RIGHT HAND  Final   Special Requests   Final    BOTTLES DRAWN AEROBIC AND ANAEROBIC Blood Culture results may not be optimal due to an inadequate volume of blood received in culture bottles   Culture   Final    NO GROWTH 4 DAYS Performed at [facility], [address] [place], [place] [address]    Report Status PENDING  Incomplete  Blood culture (routine x 2)     Status: None (Preliminary result)   Collection Time: 01/02/23  7:49 PM   Specimen: BLOOD LEFT HAND  Result Value Ref Range Status   Specimen Description BLOOD LEFT HAND  Final   Special Requests   Final    BOTTLES DRAWN AEROBIC AND ANAEROBIC Blood Culture adequate volume   Culture   Final    NO GROWTH 4 DAYS Performed at [facility], [address] [place], [place] [address]    Report Status PENDING  Incomplete      Radiology Studies: No results found.    Scheduled Meds:  amLODipine  10 mg Oral Daily   atorvastatin  80 mg Oral Daily   dexamethasone (DECADRON) injection  6 mg Intravenous Q24H   dextromethorphan-guaiFENesin  1 tablet Oral BID   fluticasone furoate-vilanterol  1 puff Inhalation Daily   furosemide  40 mg Oral Daily    gabapentin  400 mg Oral TID   insulin aspart  0-15 Units Subcutaneous TID WC   insulin aspart  0-5 Units Subcutaneous QHS   insulin aspart  5 Units Subcutaneous TID WC   [START ON 01/07/2023] insulin glargine-yfgn  25 Units Subcutaneous Daily   metoprolol  200 mg Oral Daily   pantoprazole  80 mg Oral Daily   rivaroxaban  20 mg Oral Q breakfast   Continuous Infusions:   LOS: 4 days   Time spent: 25 minutes   Noralee Stain, DO [facility] 01/06/2023, 12:34 PM   Available via Epic secure chat 7am-7pm After these hours, please refer to coverage provider listed on [email or web address]

## 2023-01-06 NOTE — Plan of Care (Signed)

## 2023-01-07 DIAGNOSIS — U071 COVID-19: Secondary | ICD-10-CM | POA: Diagnosis not present

## 2023-01-07 DIAGNOSIS — J1282 Pneumonia due to coronavirus disease 2019: Secondary | ICD-10-CM | POA: Diagnosis not present

## 2023-01-07 LAB — BASIC METABOLIC PANEL
Anion gap: 12 (ref 5–15)
Anion gap: 8 (ref 5–15)
BUN: 57 mg/dL — ABNORMAL HIGH (ref 8–23)
BUN: 56 mg/dL — ABNORMAL HIGH (ref 8–23)
CO2: 21 mmol/L — ABNORMAL LOW (ref 22–32)
CO2: 25 mmol/L (ref 22–32)
Calcium: 8.9 mg/dL (ref 8.9–10.3)
Calcium: 8.8 mg/dL — ABNORMAL LOW (ref 8.9–10.3)
Chloride: 94 mmol/L — ABNORMAL LOW (ref 98–111)
Chloride: 95 mmol/L — ABNORMAL LOW (ref 98–111)
Creatinine, Ser: 1.7 mg/dL — ABNORMAL HIGH (ref 0.44–1.00)
Creatinine, Ser: 1.67 mg/dL — ABNORMAL HIGH (ref 0.44–1.00)
GFR, Estimated: 33 mL/min — ABNORMAL LOW (ref >60)
GFR, Estimated: 34 mL/min — ABNORMAL LOW (ref >60)
Glucose, Bld: 282 mg/dL — ABNORMAL HIGH (ref 70–99)
Glucose, Bld: 360 mg/dL — ABNORMAL HIGH (ref 70–99)
Potassium: 5.9 mmol/L — ABNORMAL HIGH (ref 3.5–5.1)
Potassium: 5.5 mmol/L — ABNORMAL HIGH (ref 3.5–5.1)
Sodium: 127 mmol/L — ABNORMAL LOW (ref 135–145)
Sodium: 128 mmol/L — ABNORMAL LOW (ref 135–145)

## 2023-01-07 LAB — CULTURE, BLOOD (ROUTINE X 2)
Culture: NO GROWTH
Culture: NO GROWTH
Special Requests: ADEQUATE

## 2023-01-07 LAB — CBC
HCT: 30.9 % — ABNORMAL LOW (ref 36.0–46.0)
Hemoglobin: 9.4 g/dL — ABNORMAL LOW (ref 12.0–15.0)
MCH: 25.2 pg — ABNORMAL LOW (ref 26.0–34.0)
MCHC: 30.4 g/dL (ref 30.0–36.0)
MCV: 82.8 fL (ref 80.0–100.0)
Platelets: 387 10*3/uL (ref 150–400)
RBC: 3.73 MIL/uL — ABNORMAL LOW (ref 3.87–5.11)
RDW: 15.3 % (ref 11.5–15.5)
WBC: 13 10*3/uL — ABNORMAL HIGH (ref 4.0–10.5)
nRBC: 0.2 % (ref 0.0–0.2)

## 2023-01-07 LAB — GLUCOSE, CAPILLARY
Glucose-Capillary: 336 mg/dL — ABNORMAL HIGH (ref 70–99)
Glucose-Capillary: 257 mg/dL — ABNORMAL HIGH (ref 70–99)

## 2023-01-07 MED ORDER — INSULIN ASPART 100 UNIT/ML IJ SOLN
8.0000 [IU] | Freq: Three times a day (TID) | INTRAMUSCULAR | Status: DC
  Administered 2023-01-07 – 2023-01-08 (×2): 8 [IU] via SUBCUTANEOUS

## 2023-01-07 MED ORDER — SODIUM ZIRCONIUM CYCLOSILICATE 10 G PO PACK
10.0000 g | PACK | Freq: Every day | ORAL | Status: AC
  Administered 2023-01-07 – 2023-01-08 (×2): 10 g via ORAL
  Filled 2023-01-07 (×2): qty 1

## 2023-01-07 NOTE — Plan of Care (Signed)
  Problem: Education: Goal: Knowledge of risk factors and measures for prevention of condition will improve Outcome: Progressing   Problem: Respiratory: Goal: Complications related to the disease process, condition or treatment will be avoided or minimized Outcome: Progressing   Problem: Education: Goal: Knowledge of General Education information will improve Description: Including pain rating scale, medication(s)/side effects and non-pharmacologic comfort measures Outcome: Progressing   Problem: Health Behavior/Discharge Planning: Goal: Ability to manage health-related needs will improve Outcome: Progressing   Problem: Clinical Measurements: Goal: Ability to maintain clinical measurements within normal limits will improve Outcome: Progressing

## 2023-01-07 NOTE — Progress Notes (Signed)
Mobility Specialist Progress Note    01/07/23 1301  Mobility  Activity Ambulated independently in hallway  Level of Assistance Contact guard assist, steadying assist  Assistive Device Other (Comment) (HHA, hallway railings)  Distance Ambulated (ft) 250 ft  Activity Response Tolerated fair  Mobility Referral Yes  $Mobility charge 1 Mobility  Mobility Specialist Start Time (ACUTE ONLY) 1253  Mobility Specialist Stop Time (ACUTE ONLY) 1301  Mobility Specialist Time Calculation (min) (ACUTE ONLY) 8 min   Pre-Mobility: 74 HR, 95% SpO2 Post-Mobility: 100 HR, 97% SpO2  Pt received sitting EOB and agreeable. No complaints. Ambulated on RA. SpO2 pleth had inconsistent readings ranging from mid 80s to low 90s. Returned to sitting EOB. Encouraged pursed lip breathing. Left with call bell in reach. RN notified.   [place] Mobility Specialist  Please Neurosurgeon or Rehab Office at [phone]

## 2023-01-07 NOTE — Progress Notes (Signed)
PROGRESS NOTE    Kaitlin Cantrell  ZOX:[id number] DOB: Sep 11, 1955 DOA: 01/02/2023 PCP: [name], MD     Brief Narrative:  Kaitlin Cantrell is a 67 y.o. female with past medical history significant for DM2, HTN, HLD, atrial flutter on Xarelto, chronic diastolic congestive heart failure, history of PE 2017, COPD, NICM, chronic pain syndrome, CKD stage IIIb, history of COVID infection 2021, GERD who presented to [facility] ED on 9/2 via EMS for altered mental status.  Patient was found unresponsive by family.  On EMS arrival, glucose was noted to be 49 and patient was given 25 g of D10 with proven of glucose to 118; in addition to improvement of her mental status.  Patient also endorsed shortness of breath and cough.  She reports she has been feeling ill for the past week with also associated fatigue and poor oral intake.  She currently is on Lantus 25 units daily, metformin and glimepiride for diabetes; patient was transported by EMS to the ED for further evaluation and management.   In the ED, temperature 97.6 3 Fahrenheit, HR 63, RR 15, BP 121/51, SpO2 96% on room air.  WBC 14.1, hemoglobin 10.4, platelet count 401.  Sodium 122, potassium 4.7, chloride 85, CO2 21, glucose 120, BUN 51, creatinine 2.75.  AST 138, ALT 76, total bilirubin 0.9.  BNP 1021.6.  SARS-CoV-2 positive.  Chest x-ray with diffuse interstitial coarsening with patchy opacity at the bases suspicious for multifocal pneumonia including atypical viral infection, less likely consideration for pulmonary edema.  EKG with normal sinus rhythm, no concerning ST elevation/depressions or T wave inversions.  Patient received ceftriaxone, azithromycin, 1 L LR bolus in the ED.  TRH consulted for admission for further evaluation management of hypoglycemia, COVID viral infection, hyponatremia.  New events last 24 hours / Subjective: Patient feeling well, had to be put on O2 overnight due to desat. Thinks she has sleep apnea but would  not tolerate CPAP.   Assessment & Plan:   Principal Problem:   Pneumonia due to COVID-19 virus Active Problems:   Hypertension associated with type 2 diabetes mellitus (HCC)   Hyperlipidemia associated with type 2 diabetes mellitus (HCC)   Chronic pain syndrome   Chronic obstructive pulmonary disease (HCC)   Stage 3 chronic kidney disease (HCC)   Paroxysmal atrial fibrillation (HCC)   BMI 40.0-44.9, adult (HCC)   Hyperkalemia   Acute metabolic encephalopathy   AKI (acute kidney injury) (HCC)   Multifocal pneumonia COVID-19 virus infection Patient presenting to the ED with shortness of breath, cough.  Mild leukocytosis, no fever or signs of sepsis.  Not hypoxic.  Patient was given ceftriaxone and azithromycin in the ED. etiology likely secondary to COVID viral infection. -- Completed Remdesivir -- Decadron 6 mg IV every 24 hours x 10 days -- Airborne/contact isolation precautions  Hyperkalemia -- Lokelma and lasix today -- Repeat K this afternoon. Would like 2 normal values prior to discharge home    Acute COPD exacerbation In the setting of COVID infection.  She has diffuse rhonchi on exam but no hypoxia or respiratory distress.  High risk allergy to albuterol (history of anaphylaxis).  -- Decadron 6 mg IV every 24 hours as above -- Breo Ellipta 1 puff daily (substituted for home Symbicort) -- Atrovent neb every 6 hours as needed wheezing/shortness of breath   Hypoglycemia: Resolved Insulin-dependent type 2 diabetes Hypoglycemic in the setting poor p.o. intake due to acute viral illness.  Current home meds include Lantus 25  units daily, glimepiride, metformin, and Jardiance.  A1c 7.7 on 12/13/2022.  Glucose initially in the 40s, improved after IV dextrose given by EMS.  No recurrence of hypoglycemia in the ED.   -- Semglee, SSI.  Dose adjusted today   Altered mental status: Resolved Likely related to hypoglycemia as her mental status improved immediately after administration  of IV dextrose by EMS.  She is currently AAO x 4 and answering questions appropriately.  No focal neurodeficit on exam.   Hyponatremia Likely related to poor p.o. intake in the setting of acute viral illness.  Stable   AKI on CKD stage IIIb Likely prerenal azotemia from dehydration.  She has dry mucous membranes.  BUN 51, creatinine 2.7 (baseline 1.2-1.4).  Continue gentle IV fluid hydration and monitor renal function.  Avoid nephrotoxic agents/hold home Entresto. -- Improving   Transaminitis Possibly due to acute viral infection.  Patient has no abdominal pain/tenderness, nausea, or vomiting.   -- Resolved   Chronic HFpEF Mild MVR Essential hypertension Echo done in February 2024 showing EF 60 to 65%, mild MVR.  She has pedal edema on exam but no rales on auscultation of the lungs.  Chest x-ray more concerning for multifocal pneumonia rather than pulmonary edema.  -- Metoprolol, lasix   Hyperlipidemia -- Lipitor    Paroxysmal atrial flutter Currently in sinus rhythm.  -- Metoprolol succinate -- Continue Xarelto   GERD -- Continue PPI   Chronic pain syndrome -- Tylenol 650 mg p.o. every 6 hours as needed mild pain/fever/headache -- Flexeril 10 mg p.o. 3 times daily as needed muscle spasms -- Gabapentin 40 mg p.o. 3 times daily    DVT prophylaxis:  rivaroxaban (XARELTO) tablet 20 mg  Code Status: Full code Family Communication: None Disposition Plan: Home Status is: Inpatient Remains inpatient appropriate because: Hyperkalemia    Antimicrobials:  Anti-infectives (From admission, onward)    Start     Dose/Rate Route Frequency Ordered Stop   01/03/23 2000  remdesivir 100 mg in sodium chloride 0.9 % 100 mL IVPB       Placed in "Followed by" Linked Group   100 mg 200 mL/hr over 30 Minutes Intravenous Daily 01/02/23 2038 01/04/23 2151   01/02/23 2100  remdesivir 200 mg in sodium chloride 0.9% 250 mL IVPB       Placed in "Followed by" Linked Group   200 mg 580 mL/hr  over 30 Minutes Intravenous Once 01/02/23 2038 01/02/23 2321   01/02/23 1945  cefTRIAXone (ROCEPHIN) 1 g in sodium chloride 0.9 % 100 mL IVPB        1 g 200 mL/hr over 30 Minutes Intravenous  Once 01/02/23 1936 01/02/23 2214   01/02/23 1915  azithromycin (ZITHROMAX) 500 mg in sodium chloride 0.9 % 250 mL IVPB        500 mg 250 mL/hr over 60 Minutes Intravenous  Once 01/02/23 1905 01/02/23 2048        Objective: Vitals:   01/06/23 0812 01/06/23 0814 01/06/23 0847 01/07/23 1104  BP:  (!) 187/84    Pulse:   65   Resp:      Temp:  97.6 F (36.4 C)    TempSrc:  Oral    SpO2: 94%   100%  Weight:      Height:       No intake or output data in the 24 hours ending 01/07/23 1155  Filed Weights   01/02/23 1659 01/02/23 2113 01/04/23 0556  Weight: 115.2 kg 122.4 kg 124.7 kg  Examination:  General exam: Appears calm and comfortable  Respiratory system: Clear to auscultation. Respiratory effort normal. No respiratory distress. No conversational dyspnea.   Cardiovascular system: S1 & S2 heard, RRR. No murmurs. No pedal edema. Gastrointestinal system: Abdomen is nondistended, soft and nontender. Normal bowel sounds heard. Central nervous system: Alert and oriented. No focal neurological deficits. Speech clear.  Extremities: Symmetric in appearance  Skin: No rashes, lesions or ulcers on exposed skin  Psychiatry: Judgement and insight appear normal. Mood & affect appropriate.   Data Reviewed: I have personally reviewed following labs and imaging studies  CBC: Recent Labs  Lab 01/02/23 1718 01/03/23 0447 01/04/23 0547 01/05/23 1139 01/06/23 0756 01/07/23 0431  WBC 14.1* 9.7 10.0 13.9* 14.1* 13.0*  NEUTROABS 11.4*  --   --   --   --   --   HGB 10.4* 10.4* 11.7* 9.7* 10.6* 9.4*  HCT 34.6* 33.2* 37.9 31.6* 34.6* 30.9*  MCV 84.2 82.6 85.7 84.5 82.2 82.8  PLT 401* 385 367 336 425* 387   Basic Metabolic Panel: Recent Labs  Lab 01/04/23 1830 01/05/23 1139 01/05/23 1440  01/05/23 2205 01/06/23 0756 01/06/23 1359 01/07/23 0431  NA 126* 124* 127*  --  129*  --  127*  K 5.3* 6.3* 6.0* 5.1 5.6* 4.7 5.9*  CL 93* 94* 94*  --  94*  --  94*  CO2 22 20* 22  --  27  --  21*  GLUCOSE 168* 320* 276*  --  322*  --  282*  BUN 52* 53* 53*  --  52*  --  57*  CREATININE 1.93* 1.69* 1.66*  --  1.55*  --  1.70*  CALCIUM 9.2 8.9 9.2  --  9.3  --  8.9   GFR: Estimated Creatinine Clearance: 44.5 mL/min (A) (by C-G formula based on SCr of 1.7 mg/dL (H)). Liver Function Tests: Recent Labs  Lab 01/02/23 1718 01/03/23 0447 01/04/23 0547 01/05/23 1139  AST 138* 86* 47* 21  ALT 76* 62* 50* 32  ALKPHOS 84 81 86 72  BILITOT 0.9 0.6 0.6 0.5  PROT 6.5 6.3* 6.4* 6.1*  ALBUMIN 2.6* 2.3* 2.6* 2.6*   No results for input(s): "LIPASE", "AMYLASE" in the last 168 hours. No results for input(s): "AMMONIA" in the last 168 hours. Coagulation Profile: No results for input(s): "INR", "PROTIME" in the last 168 hours. Cardiac Enzymes: No results for input(s): "CKTOTAL", "CKMB", "CKMBINDEX", "TROPONINI" in the last 168 hours. BNP (last 3 results) No results for input(s): "PROBNP" in the last 8760 hours. HbA1C: No results for input(s): "HGBA1C" in the last 72 hours. CBG: Recent Labs  Lab 01/05/23 0817 01/05/23 1157 01/06/23 1256 01/06/23 1736 01/06/23 2204  GLUCAP 292* 335* 371* 229* 200*   Lipid Profile: No results for input(s): "CHOL", "HDL", "LDLCALC", "TRIG", "CHOLHDL", "LDLDIRECT" in the last 72 hours. Thyroid Function Tests: No results for input(s): "TSH", "T4TOTAL", "FREET4", "T3FREE", "THYROIDAB" in the last 72 hours. Anemia Panel: No results for input(s): "VITAMINB12", "FOLATE", "FERRITIN", "TIBC", "IRON", "RETICCTPCT" in the last 72 hours. Sepsis Labs: Recent Labs  Lab 01/03/23 0447  PROCALCITON 0.40    Recent Results (from the past 240 hour(s))  SARS Coronavirus 2 by RT PCR (hospital order, performed in [facility] hospital lab) *cepheid single result  test* Anterior Nasal Swab     Status: Abnormal   Collection Time: 01/02/23  6:10 PM   Specimen: Anterior Nasal Swab  Result Value Ref Range Status   SARS Coronavirus 2 by RT PCR POSITIVE (  A) NEGATIVE Final    Comment: Performed at [facility], [address], [place], [place] [address]  Blood culture (routine x 2)     Status: None   Collection Time: 01/02/23  7:49 PM   Specimen: BLOOD RIGHT HAND  Result Value Ref Range Status   Specimen Description BLOOD RIGHT HAND  Final   Special Requests   Final    BOTTLES DRAWN AEROBIC AND ANAEROBIC Blood Culture results may not be optimal due to an inadequate volume of blood received in culture bottles   Culture   Final    NO GROWTH 5 DAYS Performed at [facility], [address], [place], [place] [address]    Report Status 01/07/2023 FINAL  Final  Blood culture (routine x 2)     Status: None   Collection Time: 01/02/23  7:49 PM   Specimen: BLOOD LEFT HAND  Result Value Ref Range Status   Specimen Description BLOOD LEFT HAND  Final   Special Requests   Final    BOTTLES DRAWN AEROBIC AND ANAEROBIC Blood Culture adequate volume   Culture   Final    NO GROWTH 5 DAYS Performed at [facility], [address], [place], [place] [address]    Report Status 01/07/2023 FINAL  Final      Radiology Studies: No results found.    Scheduled Meds:  amLODipine  10 mg Oral Daily   atorvastatin  80 mg Oral Daily   dexamethasone (DECADRON) injection  6 mg Intravenous Q24H   dextromethorphan-guaiFENesin  1 tablet Oral BID   fluticasone furoate-vilanterol  1 puff Inhalation Daily   furosemide  40 mg Oral Daily   gabapentin  400 mg Oral TID   insulin aspart  0-15 Units Subcutaneous TID WC   insulin aspart  0-5 Units Subcutaneous QHS   insulin aspart  5 Units Subcutaneous TID WC   insulin glargine-yfgn  25 Units Subcutaneous Daily   metoprolol  200 mg Oral Daily   pantoprazole  80 mg Oral Daily   rivaroxaban  20 mg Oral Q  breakfast   sodium zirconium cyclosilicate  10 g Oral Daily   Continuous Infusions:   LOS: 5 days   Time spent: 25 minutes   Noralee Stain, DO [facility] 01/07/2023, 11:55 AM   Available via Epic secure chat 7am-7pm After these hours, please refer to coverage provider listed on [email or web address]

## 2023-01-08 DIAGNOSIS — J1282 Pneumonia due to coronavirus disease 2019: Secondary | ICD-10-CM | POA: Diagnosis not present

## 2023-01-08 DIAGNOSIS — U071 COVID-19: Secondary | ICD-10-CM | POA: Diagnosis not present

## 2023-01-08 LAB — CBC
HCT: 35 % — ABNORMAL LOW (ref 36.0–46.0)
Hemoglobin: 10.6 g/dL — ABNORMAL LOW (ref 12.0–15.0)
MCH: 25.1 pg — ABNORMAL LOW (ref 26.0–34.0)
MCHC: 30.3 g/dL (ref 30.0–36.0)
MCV: 82.7 fL (ref 80.0–100.0)
Platelets: 383 10*3/uL (ref 150–400)
RBC: 4.23 MIL/uL (ref 3.87–5.11)
RDW: 15.5 % (ref 11.5–15.5)
WBC: 13.4 10*3/uL — ABNORMAL HIGH (ref 4.0–10.5)
nRBC: 0 % (ref 0.0–0.2)

## 2023-01-08 LAB — BASIC METABOLIC PANEL
Anion gap: 12 (ref 5–15)
Anion gap: 8 (ref 5–15)
BUN: 56 mg/dL — ABNORMAL HIGH (ref 8–23)
BUN: 54 mg/dL — ABNORMAL HIGH (ref 8–23)
CO2: 27 mmol/L (ref 22–32)
CO2: 27 mmol/L (ref 22–32)
Calcium: 9.3 mg/dL (ref 8.9–10.3)
Calcium: 8.8 mg/dL — ABNORMAL LOW (ref 8.9–10.3)
Chloride: 94 mmol/L — ABNORMAL LOW (ref 98–111)
Chloride: 95 mmol/L — ABNORMAL LOW (ref 98–111)
Creatinine, Ser: 1.82 mg/dL — ABNORMAL HIGH (ref 0.44–1.00)
Creatinine, Ser: 1.72 mg/dL — ABNORMAL HIGH (ref 0.44–1.00)
GFR, Estimated: 30 mL/min — ABNORMAL LOW (ref >60)
GFR, Estimated: 33 mL/min — ABNORMAL LOW (ref >60)
Glucose, Bld: 207 mg/dL — ABNORMAL HIGH (ref 70–99)
Glucose, Bld: 393 mg/dL — ABNORMAL HIGH (ref 70–99)
Potassium: 5.1 mmol/L (ref 3.5–5.1)
Potassium: 4.2 mmol/L (ref 3.5–5.1)
Sodium: 133 mmol/L — ABNORMAL LOW (ref 135–145)
Sodium: 130 mmol/L — ABNORMAL LOW (ref 135–145)

## 2023-01-08 LAB — GLUCOSE, CAPILLARY
Glucose-Capillary: 301 mg/dL — ABNORMAL HIGH (ref 70–99)
Glucose-Capillary: 248 mg/dL — ABNORMAL HIGH (ref 70–99)

## 2023-01-08 MED ORDER — METOPROLOL TARTRATE 5 MG/5ML IV SOLN
5.0000 mg | Freq: Once | INTRAVENOUS | Status: DC
  Filled 2023-01-08: qty 5

## 2023-01-08 MED ORDER — DILTIAZEM HCL-DEXTROSE 125-5 MG/125ML-% IV SOLN (PREMIX): 5.0000 mg/h | INTRAVENOUS | Status: DC

## 2023-01-08 NOTE — Discharge Summary (Signed)
Physician Discharge Summary  Kaitlin Cantrell UXL:[id number] DOB: 1955-10-27 DOA: 01/02/2023  PCP: [name], MD  Admit date: 01/02/2023 Discharge date: 01/08/2023  Admitted From: Home Disposition: Home  Recommendations for Outpatient Follow-up:  Follow up with PCP in 1 week Recommend follow-up BMP to ensure stability of potassium.  Hospitalization was prolonged due to hyperkalemia which was treated with Lasix and Lokelma.  Potassium levels stabilized prior to discharge home  Discharge Condition: Stable CODE STATUS: Full code Diet recommendation: Heart healthy diet  Brief/Interim Summary: Kaitlin Cantrell is a 67 y.o. female with past medical history significant for DM2, HTN, HLD, atrial flutter on Xarelto, chronic diastolic congestive heart failure, history of PE 2017, COPD, NICM, chronic pain syndrome, CKD stage IIIb, history of COVID infection 2021, GERD who presented to [facility] ED on 9/2 via EMS for altered mental status.  Patient was found unresponsive by family.  On EMS arrival, glucose was noted to be 49 and patient was given 25 g of D10 with proven of glucose to 118; in addition to improvement of her mental status.  Patient also endorsed shortness of breath and cough.  She reports she has been feeling ill for the past week with also associated fatigue and poor oral intake.  She currently is on Lantus 25 units daily, metformin and glimepiride for diabetes; patient was transported by EMS to the ED for further evaluation and management.   In the ED, temperature 97.6 3 Fahrenheit, HR 63, RR 15, BP 121/51, SpO2 96% on room air.  WBC 14.1, hemoglobin 10.4, platelet count 401.  Sodium 122, potassium 4.7, chloride 85, CO2 21, glucose 120, BUN 51, creatinine 2.75.  AST 138, ALT 76, total bilirubin 0.9.  BNP 1021.6.  SARS-CoV-2 positive.  Chest x-ray with diffuse interstitial coarsening with patchy opacity at the bases suspicious for multifocal pneumonia including atypical viral  infection, less likely consideration for pulmonary edema.  EKG with normal sinus rhythm, no concerning ST elevation/depressions or T wave inversions.  Patient received ceftriaxone, azithromycin, 1 L LR bolus in the ED.  TRH consulted for admission for further evaluation management of hypoglycemia, COVID viral infection, hyponatremia.  Patient continued to improve clinically from COVID.  Hospitalization complicated due to recurrent hyperkalemia, which was treated appropriately.  Also went into A-fib RVR, which resolved prior to discharge home.  On day of discharge, patient was on room air, feeling well without any complaints.  She converted to normal sinus rhythm.  Potassium level normalized prior to discharge home.  Discharge Diagnoses:   Principal Problem:   Pneumonia due to COVID-19 virus Active Problems:   Hypertension associated with type 2 diabetes mellitus (HCC)   Hyperlipidemia associated with type 2 diabetes mellitus (HCC)   Chronic pain syndrome   Chronic obstructive pulmonary disease (HCC)   Stage 3 chronic kidney disease (HCC)   Paroxysmal atrial fibrillation (HCC)   BMI 40.0-44.9, adult (HCC)   Hyperkalemia   Acute metabolic encephalopathy   AKI (acute kidney injury) (HCC)    Multifocal pneumonia COVID-19 virus infection Patient presenting to the ED with shortness of breath, cough.  Mild leukocytosis, no fever or signs of sepsis.  Not hypoxic.  Patient was given ceftriaxone and azithromycin in the ED. etiology likely secondary to COVID viral infection. -- Completed Remdesivir -- Decadron   Hyperkalemia -- Resolved   Acute COPD exacerbation In the setting of COVID infection.  She has diffuse rhonchi on exam but no hypoxia or respiratory distress.  High risk allergy to  albuterol (history of anaphylaxis).  -- Decadron, breathing treatments and inhalers.  Improved   Hypoglycemia: Resolved Insulin-dependent type 2 diabetes Hypoglycemic in the setting poor p.o. intake due  to acute viral illness.  Current home meds include Lantus 25 units daily, glimepiride, metformin, and Jardiance.  A1c 7.7 on 12/13/2022.  Glucose initially in the 40s, improved after IV dextrose given by EMS.  No recurrence of hypoglycemia in the ED.   -- Semglee, SSI during hospital stay   Altered mental status: Resolved Likely related to hypoglycemia as her mental status improved immediately after administration of IV dextrose by EMS.  She is currently AAO x 4 and answering questions appropriately.  No focal neurodeficit on exam.   Hyponatremia Likely related to poor p.o. intake in the setting of acute viral illness.  Stable   AKI on CKD stage IIIb Likely prerenal azotemia from dehydration.  She has dry mucous membranes.  BUN 51, creatinine 2.7 (baseline 1.2-1.4).  Continue gentle IV fluid hydration and monitor renal function.  Avoid nephrotoxic agents/hold home Entresto. -- Improved   Transaminitis Possibly due to acute viral infection.  Patient has no abdominal pain/tenderness, nausea, or vomiting.   -- Resolved   Chronic HFpEF Mild MVR Essential hypertension Echo done in February 2024 showing EF 60 to 65%, mild MVR.  She has pedal edema on exam but no rales on auscultation of the lungs.  Chest x-ray more concerning for multifocal pneumonia rather than pulmonary edema.  -- Metoprolol, lasix   Hyperlipidemia -- Lipitor    Paroxysmal atrial flutter A-fib RVR overnight.  Converted to normal sinus rhythm this morning  -- Metoprolol succinate -- Continue Xarelto   GERD -- Continue PPI   Chronic pain syndrome -- Resume home meds  Discharge Instructions  Discharge Instructions     Call MD for:  difficulty breathing, headache or visual disturbances   Complete by: As directed    Call MD for:  extreme fatigue   Complete by: As directed    Call MD for:  persistant dizziness or light-headedness   Complete by: As directed    Call MD for:  persistant nausea and vomiting   Complete  by: As directed    Call MD for:  severe uncontrolled pain   Complete by: As directed    Call MD for:  temperature >100.4   Complete by: As directed    Diet - low sodium heart healthy   Complete by: As directed    Discharge instructions   Complete by: As directed    You were cared for by a hospitalist during your hospital stay. If you have any questions about your discharge medications or the care you received while you were in the hospital after you are discharged, you can call the unit and ask to speak with the hospitalist on call if the hospitalist that took care of you is not available. Once you are discharged, your primary care physician will handle any further medical issues. Please note that NO REFILLS for any discharge medications will be authorized once you are discharged, as it is imperative that you return to your primary care physician (or establish a relationship with a primary care physician if you do not have one) for your aftercare needs so that they can reassess your need for medications and monitor your lab values.   Increase activity slowly   Complete by: As directed       Allergies as of 01/08/2023       Reactions  Albuterol Anaphylaxis   Throat/lips swelling   Morphine And Codeine Other (See Comments)   Very aggressive and doesn't help pain        Medication List     STOP taking these medications    empagliflozin 10 MG Tabs tablet Commonly known as: Jardiance       TAKE these medications    atorvastatin 80 MG tablet Commonly known as: LIPITOR TAKE 1 TABLET BY MOUTH ONCE DAILY AT  6PM What changed:  how much to take how to take this when to take this additional instructions   Atrovent HFA 17 MCG/ACT inhaler Generic drug: ipratropium Inhale 2 puffs into the lungs every 6 (six) hours as needed for wheezing. What changed: when to take this   budesonide 0.25 MG/2ML nebulizer solution Commonly known as: Pulmicort Take 4 mLs (0.5 mg total) by  nebulization daily. What changed: how much to take   budesonide-formoterol 160-4.5 MCG/ACT inhaler Commonly known as: Symbicort Inhale 1 puff into the lungs 2 (two) times daily. What changed:  how much to take when to take this   cyclobenzaprine 10 MG tablet Commonly known as: FLEXERIL Take 1 tablet (10 mg total) by mouth 3 (three) times daily as needed. for muscle spams What changed:  when to take this additional instructions   Entresto 97-103 MG Generic drug: sacubitril-valsartan Take 1 tablet by mouth 2 (two) times daily.   ergocalciferol 1.25 MG (50000 UT) capsule Commonly known as: Drisdol Take 1 capsule (50,000 Units total) by mouth once a week.   furosemide 40 MG tablet Commonly known as: LASIX Take 1 tablet (40 mg total) by mouth daily.   gabapentin 400 MG capsule Commonly known as: NEURONTIN Take 1 capsule (400 mg total) by mouth 3 (three) times daily. What changed: when to take this   glimepiride 4 MG tablet Commonly known as: AMARYL Take 1 tablet (4 mg total) by mouth 2 (two) times daily.   ipratropium 0.02 % nebulizer solution Commonly known as: ATROVENT Take 2.5 mLs (0.5 mg total) by nebulization every 6 (six) hours as needed for wheezing or shortness of breath. What changed: when to take this   Providence Lanius Caps Take 1 capsule by mouth daily.   Lantus SoloStar 100 UNIT/ML Solostar Pen Generic drug: insulin glargine Inject 25 Units into the skin daily. What changed: when to take this   metFORMIN 850 MG tablet Commonly known as: GLUCOPHAGE Take 1 tablet (850 mg total) by mouth 2 (two) times daily with a meal.   metoprolol 200 MG 24 hr tablet Commonly known as: TOPROL-XL Take 1 tablet (200 mg total) by mouth daily.   omeprazole 20 MG capsule Commonly known as: PRILOSEC Take 20 mg by mouth 2 (two) times daily before a meal.   Pen Needles 32G X 4 MM Misc 1 Units by Does not apply route daily.   rivaroxaban 20 MG Tabs tablet Commonly known as:  Xarelto Take 1 tablet (20 mg total) by mouth daily.        Follow-up Information     [name], MD Follow up.   Specialty: Family Medicine Contact information: [address] [place] [address] [phone]                Allergies  Allergen Reactions   Albuterol Anaphylaxis    Throat/lips swelling   Morphine And Codeine Other (See Comments)    Very aggressive and doesn't help pain      Procedures/Studies: DG Chest Portable 1 View  Result Date: 01/02/2023 CLINICAL DATA:  Cough and fever. EXAM: PORTABLE CHEST 1 VIEW COMPARISON:  01/16/2020, chest CT 02/28/2022 FINDINGS: Upper normal heart size. Diffuse interstitial coarsening with patchy opacity at the bases. No pneumothorax or significant pleural effusion. No acute osseous findings. IMPRESSION: Diffuse interstitial coarsening with patchy opacity at the bases, suspicious for multifocal pneumonia, including atypical viral infection. Less likely consideration pulmonary edema. Electronically Signed   By: Narda Rutherford M.D.   On: 01/02/2023 18:55       Discharge Exam: Vitals:   01/08/23 0557 01/08/23 0834  BP: (!) 148/91 (!) 119/58  Pulse: (!) 123   Resp:  16  Temp:    SpO2:  92%    General: Pt is alert, awake, not in acute distress Cardiovascular: RRR, S1/S2 +, no edema Respiratory: CTA bilaterally, no wheezing, no rhonchi, no respiratory distress, no conversational dyspnea, on room air  Abdominal: Soft, NT, ND, bowel sounds + Extremities: no edema, no cyanosis Psych: Normal mood and affect, stable judgement and insight     The results of significant diagnostics from this hospitalization (including imaging, microbiology, ancillary and laboratory) are listed below for reference.     Microbiology: Recent Results (from the past 240 hour(s))  SARS Coronavirus 2 by RT PCR (hospital order, performed in [facility] hospital lab) *cepheid single result test* Anterior Nasal Swab     Status: Abnormal    Collection Time: 01/02/23  6:10 PM   Specimen: Anterior Nasal Swab  Result Value Ref Range Status   SARS Coronavirus 2 by RT PCR POSITIVE (A) NEGATIVE Final    Comment: Performed at [facility], [address], [place], [place] [address]  Blood culture (routine x 2)     Status: None   Collection Time: 01/02/23  7:49 PM   Specimen: BLOOD RIGHT HAND  Result Value Ref Range Status   Specimen Description BLOOD RIGHT HAND  Final   Special Requests   Final    BOTTLES DRAWN AEROBIC AND ANAEROBIC Blood Culture results may not be optimal due to an inadequate volume of blood received in culture bottles   Culture   Final    NO GROWTH 5 DAYS Performed at [facility], [address], [place], [place] [address]    Report Status 01/07/2023 FINAL  Final  Blood culture (routine x 2)     Status: None   Collection Time: 01/02/23  7:49 PM   Specimen: BLOOD LEFT HAND  Result Value Ref Range Status   Specimen Description BLOOD LEFT HAND  Final   Special Requests   Final    BOTTLES DRAWN AEROBIC AND ANAEROBIC Blood Culture adequate volume   Culture   Final    NO GROWTH 5 DAYS Performed at [facility], [address] [place], [place] [address]    Report Status 01/07/2023 FINAL  Final     Labs: BNP (last 3 results) Recent Labs    01/03/23 0447  BNP 1,021.6*   Basic Metabolic Panel: Recent Labs  Lab 01/06/23 0756 01/06/23 1359 01/07/23 0431 01/07/23 1231 01/08/23 0417 01/08/23 0909  NA 129*  --  127* 128* 133* 130*  K 5.6* 4.7 5.9* 5.5* 5.1 4.2  CL 94*  --  94* 95* 94* 95*  CO2 27  --  21* 25 27 27   GLUCOSE 322*  --  282* 360* 207* 393*  BUN 52*  --  57* 56* 56* 54*  CREATININE 1.55*  --  1.70* 1.67* 1.82* 1.72*  CALCIUM 9.3  --  8.9 8.8* 9.3 8.8*   Liver Function Tests: Recent Labs  Lab 01/02/23 1718 01/03/23 0447 01/04/23 0547 01/05/23 1139  AST 138* 86* 47* 21  ALT 76* 62* 50* 32  ALKPHOS 84 81 86 72  BILITOT 0.9 0.6 0.6 0.5  PROT 6.5 6.3* 6.4* 6.1*   ALBUMIN 2.6* 2.3* 2.6* 2.6*   No results for input(s): "LIPASE", "AMYLASE" in the last 168 hours. No results for input(s): "AMMONIA" in the last 168 hours. CBC: Recent Labs  Lab 01/02/23 1718 01/03/23 0447 01/04/23 0547 01/05/23 1139 01/06/23 0756 01/07/23 0431 01/08/23 0417  WBC 14.1*   < > 10.0 13.9* 14.1* 13.0* 13.4*  NEUTROABS 11.4*  --   --   --   --   --   --   HGB 10.4*   < > 11.7* 9.7* 10.6* 9.4* 10.6*  HCT 34.6*   < > 37.9 31.6* 34.6* 30.9* 35.0*  MCV 84.2   < > 85.7 84.5 82.2 82.8 82.7  PLT 401*   < > 367 336 425* 387 383   < > = values in this interval not displayed.   Cardiac Enzymes: No results for input(s): "CKTOTAL", "CKMB", "CKMBINDEX", "TROPONINI" in the last 168 hours. BNP: Invalid input(s): "POCBNP" CBG: Recent Labs  Lab 01/06/23 1736 01/06/23 2204 01/07/23 1207 01/07/23 2032 01/08/23 0804  GLUCAP 229* 200* 336* 257* 301*   D-Dimer No results for input(s): "DDIMER" in the last 72 hours. Hgb A1c No results for input(s): "HGBA1C" in the last 72 hours. Lipid Profile No results for input(s): "CHOL", "HDL", "LDLCALC", "TRIG", "CHOLHDL", "LDLDIRECT" in the last 72 hours. Thyroid function studies No results for input(s): "TSH", "T4TOTAL", "T3FREE", "THYROIDAB" in the last 72 hours.  Invalid input(s): "FREET3" Anemia work up No results for input(s): "VITAMINB12", "FOLATE", "FERRITIN", "TIBC", "IRON", "RETICCTPCT" in the last 72 hours. Urinalysis    Component Value Date/Time   COLORURINE YELLOW 12/29/2015 1252   APPEARANCEUR CLEAR 12/29/2015 1252   LABSPEC 1.029 12/29/2015 1252   PHURINE 5.0 12/29/2015 1252   GLUCOSEU NEGATIVE 12/29/2015 1252   HGBUR NEGATIVE 12/29/2015 1252   BILIRUBINUR NEGATIVE 12/29/2015 1252   BILIRUBINUR small 06/08/2012 1237   KETONESUR NEGATIVE 12/29/2015 1252   PROTEINUR NEGATIVE 12/29/2015 1252   UROBILINOGEN 0.2 06/08/2012 1237   UROBILINOGEN 0.2 02/07/2012 1135   NITRITE NEGATIVE 12/29/2015 1252   LEUKOCYTESUR  NEGATIVE 12/29/2015 1252   Sepsis Labs Recent Labs  Lab 01/05/23 1139 01/06/23 0756 01/07/23 0431 01/08/23 0417  WBC 13.9* 14.1* 13.0* 13.4*   Microbiology Recent Results (from the past 240 hour(s))  SARS Coronavirus 2 by RT PCR (hospital order, performed in [facility] hospital lab) *cepheid single result test* Anterior Nasal Swab     Status: Abnormal   Collection Time: 01/02/23  6:10 PM   Specimen: Anterior Nasal Swab  Result Value Ref Range Status   SARS Coronavirus 2 by RT PCR POSITIVE (A) NEGATIVE Final    Comment: Performed at [facility] Lab, [address] [place], [place] [address]  Blood culture (routine x 2)     Status: None   Collection Time: 01/02/23  7:49 PM   Specimen: BLOOD RIGHT HAND  Result Value Ref Range Status   Specimen Description BLOOD RIGHT HAND  Final   Special Requests   Final    BOTTLES DRAWN AEROBIC AND ANAEROBIC Blood Culture results may not be optimal due to an inadequate volume of blood received in culture bottles   Culture  Final    NO GROWTH 5 DAYS Performed at [facility], [address], [place], [place] [address]    Report Status 01/07/2023 FINAL  Final  Blood culture (routine x 2)     Status: None   Collection Time: 01/02/23  7:49 PM   Specimen: BLOOD LEFT HAND  Result Value Ref Range Status   Specimen Description BLOOD LEFT HAND  Final   Special Requests   Final    BOTTLES DRAWN AEROBIC AND ANAEROBIC Blood Culture adequate volume   Culture   Final    NO GROWTH 5 DAYS Performed at [facility], [address], [place], [place] [address]    Report Status 01/07/2023 FINAL  Final     Patient was seen and examined on the day of discharge and was found to be in stable condition. Time coordinating discharge: 35 minutes including assessment and coordination of care, as well as examination of the patient.   SIGNED:  Noralee Stain, DO [facility] 01/08/2023, 10:53 AM

## 2023-01-08 NOTE — Progress Notes (Signed)
Pt Afib RVR 130s while lying in bed.  Asymptomatic.  EKG obtained and Dr. Joneen Roach notified with orders to give daily toprol XL - Administered per orders, HR now 108-113.  Will continue to monitor.

## 2023-01-10 ENCOUNTER — Other Ambulatory Visit: Payer: Self-pay | Admitting: Family Medicine

## 2023-01-10 ENCOUNTER — Encounter: Payer: Self-pay | Admitting: Family Medicine

## 2023-01-10 ENCOUNTER — Ambulatory Visit (INDEPENDENT_AMBULATORY_CARE_PROVIDER_SITE_OTHER): Payer: PPO | Admitting: Family Medicine

## 2023-01-10 VITALS — BP 132/83 | HR 69 | Ht 66.0 in | Wt 260.0 lb

## 2023-01-10 DIAGNOSIS — Z7984 Long term (current) use of oral hypoglycemic drugs: Secondary | ICD-10-CM | POA: Diagnosis not present

## 2023-01-10 DIAGNOSIS — E875 Hyperkalemia: Secondary | ICD-10-CM

## 2023-01-10 DIAGNOSIS — E119 Type 2 diabetes mellitus without complications: Secondary | ICD-10-CM | POA: Diagnosis not present

## 2023-01-10 DIAGNOSIS — D649 Anemia, unspecified: Secondary | ICD-10-CM | POA: Diagnosis not present

## 2023-01-10 DIAGNOSIS — E1159 Type 2 diabetes mellitus with other circulatory complications: Secondary | ICD-10-CM | POA: Diagnosis not present

## 2023-01-10 DIAGNOSIS — I152 Hypertension secondary to endocrine disorders: Secondary | ICD-10-CM

## 2023-01-10 MED ORDER — FREESTYLE LIBRE 3 SENSOR MISC: 3 refills | Status: DC

## 2023-01-10 MED ORDER — FREESTYLE LIBRE 3 READER DEVI: 1.0000 | 0 refills | Status: DC

## 2023-01-10 MED ORDER — AMLODIPINE BESYLATE 5 MG PO TABS: 5.0000 mg | ORAL_TABLET | Freq: Every day | ORAL | 2 refills | Status: DC

## 2023-01-10 NOTE — Progress Notes (Unsigned)
   Established Patient Office Visit  Subjective   Patient ID: Kaitlin Cantrell, female    DOB: Aug 08, 1955  Age: 67 y.o. MRN: [id number]  No chief complaint on file.   HPI  Hospital follow-up-[facility].  Hypoglycemia.,  COVID, hyperkalemia, COPD exacerbation  What diabetes medications that she is taking now?  Why did it tell her to stop taking Jardiance?  And not glimepiride.  Didn't start jradiance bc it was too expensive.    HTN - 162/97   The 10-year ASCVD risk score (Arnett DK, et al., 2019) is: 21.9%  Health Maintenance Due  Topic Date Due  . COVID-19 Vaccine (1) Never done  . Zoster Vaccines- Shingrix (1 of 2) 12/06/1976  . PAP SMEAR-Modifier  03/10/2014  . OPHTHALMOLOGY EXAM  03/30/2017  . Pneumonia Vaccine 34+ Years old (2 of 2 - PCV) 06/23/2017  . Medicare Annual Wellness (AWV)  12/28/2017  . FOOT EXAM  05/17/2018  . INFLUENZA VACCINE  12/01/2022  . DEXA SCAN  Never done  . Diabetic kidney evaluation - Urine ACR  02/03/2023      Objective:     There were no vitals taken for this visit. {Vitals History (Optional):23777}  Physical Exam   No results found for any visits on 01/10/23.      Assessment & Plan:   There are no diagnoses linked to this encounter.   No follow-ups on file.    [name], MD

## 2023-01-10 NOTE — Patient Instructions (Signed)
It was nice to see you today,  We addressed the following topics today: -I have ordered a freestyle libre sensor and device.  If you need help applying this let us know.  If it is not covered by your insurance and they prefer Dexcom instead let us know. - I have added amlodipine 5 mg to take daily for your blood pressure. - I would like you to consider taking your insulin in the morning after waking up instead of before going to bed because taking it at night can cause the insulin levels to peak while you are sleeping and not eating anything.   Have a great day,  Frederic Jericho, MD

## 2023-01-11 LAB — CBC WITH DIFFERENTIAL/PLATELET
Basophils Absolute: 0 10*3/uL (ref 0.0–0.2)
Basos: 0 %
EOS (ABSOLUTE): 0.3 10*3/uL (ref 0.0–0.4)
Eos: 2 %
Hematocrit: 35.8 % (ref 34.0–46.6)
Hemoglobin: 10.7 g/dL — ABNORMAL LOW (ref 11.1–15.9)
Immature Grans (Abs): 0.2 10*3/uL — ABNORMAL HIGH (ref 0.0–0.1)
Immature Granulocytes: 2 %
Lymphocytes Absolute: 1.5 10*3/uL (ref 0.7–3.1)
Lymphs: 10 %
MCH: 25.8 pg — ABNORMAL LOW (ref 26.6–33.0)
MCHC: 29.9 g/dL — ABNORMAL LOW (ref 31.5–35.7)
MCV: 86 fL (ref 79–97)
Monocytes Absolute: 1.4 10*3/uL — ABNORMAL HIGH (ref 0.1–0.9)
Monocytes: 9 %
Neutrophils Absolute: 11.2 10*3/uL — ABNORMAL HIGH (ref 1.4–7.0)
Neutrophils: 77 %
Platelets: 362 10*3/uL (ref 150–450)
RBC: 4.15 x10E6/uL (ref 3.77–5.28)
RDW: 14.2 % (ref 11.7–15.4)
WBC: 14.5 10*3/uL — ABNORMAL HIGH (ref 3.4–10.8)

## 2023-01-11 LAB — COMPREHENSIVE METABOLIC PANEL
ALT: 10 IU/L (ref 0–32)
AST: 13 IU/L (ref 0–40)
Albumin: 3.4 g/dL — ABNORMAL LOW (ref 3.9–4.9)
Alkaline Phosphatase: 111 IU/L (ref 44–121)
BUN/Creatinine Ratio: 23 (ref 12–28)
BUN: 32 mg/dL — ABNORMAL HIGH (ref 8–27)
Bilirubin Total: 0.3 mg/dL (ref 0.0–1.2)
CO2: 26 mmol/L (ref 20–29)
Calcium: 9.2 mg/dL (ref 8.7–10.3)
Chloride: 99 mmol/L (ref 96–106)
Creatinine, Ser: 1.37 mg/dL — ABNORMAL HIGH (ref 0.57–1.00)
Globulin, Total: 2.5 g/dL (ref 1.5–4.5)
Glucose: 285 mg/dL — ABNORMAL HIGH (ref 70–99)
Potassium: 4.7 mmol/L (ref 3.5–5.2)
Sodium: 140 mmol/L (ref 134–144)
Total Protein: 5.9 g/dL — ABNORMAL LOW (ref 6.0–8.5)
eGFR: 43 mL/min/{1.73_m2} — ABNORMAL LOW (ref >59)

## 2023-01-12 NOTE — Assessment & Plan Note (Signed)
Amlodipine added due to uncontrolled blood pressure.

## 2023-01-12 NOTE — Assessment & Plan Note (Signed)
Patient not taking Jardiance.  Recommended taking her insulin in the morning.  Ordered continuous glucose monitor so hopefully this is easier for her and will provide [place] with more information regarding possible hypoglycemic episodes.  Initial hypoglycemia that brought her to the hospital likely related to her acute illness with COVID.

## 2023-02-07 ENCOUNTER — Encounter: Payer: Self-pay | Admitting: Family Medicine

## 2023-02-07 ENCOUNTER — Ambulatory Visit (INDEPENDENT_AMBULATORY_CARE_PROVIDER_SITE_OTHER): Payer: PPO | Admitting: Family Medicine

## 2023-02-07 VITALS — BP 121/65 | HR 55 | Ht 66.0 in | Wt 262.1 lb

## 2023-02-07 DIAGNOSIS — F17200 Nicotine dependence, unspecified, uncomplicated: Secondary | ICD-10-CM

## 2023-02-07 DIAGNOSIS — D649 Anemia, unspecified: Secondary | ICD-10-CM | POA: Diagnosis not present

## 2023-02-07 DIAGNOSIS — Z23 Encounter for immunization: Secondary | ICD-10-CM

## 2023-02-07 DIAGNOSIS — Z7984 Long term (current) use of oral hypoglycemic drugs: Secondary | ICD-10-CM | POA: Diagnosis not present

## 2023-02-07 DIAGNOSIS — E119 Type 2 diabetes mellitus without complications: Secondary | ICD-10-CM | POA: Diagnosis not present

## 2023-02-07 DIAGNOSIS — G2581 Restless legs syndrome: Secondary | ICD-10-CM | POA: Diagnosis not present

## 2023-02-07 DIAGNOSIS — G894 Chronic pain syndrome: Secondary | ICD-10-CM | POA: Diagnosis not present

## 2023-02-07 MED ORDER — GABAPENTIN 400 MG PO CAPS
400.0000 mg | ORAL_CAPSULE | Freq: Three times a day (TID) | ORAL | 1 refills | Status: DC
Start: 2023-02-07 — End: 2023-02-28

## 2023-02-07 NOTE — Assessment & Plan Note (Signed)
Patient has received her CGM and placed the sensor but has not set up the app on her phone to receive the data yet.  Discussed importance of setting up the account so that data can be transmitted to the app and identify when she is having hypoglycemic episodes.  Will need to send in the information regarding our office account so she can share her data with [place].  Discussed referring her to case manager who may be able to help with Jardiance affordability.

## 2023-02-07 NOTE — Patient Instructions (Addendum)
It was nice to see you today,  We addressed the following topics today: -Make sure you set up your continuous glucose monitor to your phone app so that he can download the information.  This is the only way it we will be able to alert you when you have low blood sugars. - I will check some vitamin labs Because of your restless leg.  Vitamin D, B12 and magnesium are all available over-the-counter but I can send in a prescription if they are low.  I will discuss this more after get the results with you - I will follow-up with you in 1 month to recheck your A1c and we can discuss again if the restless leg is better after adjusting her vitamin deficiency. - I will send in a referral to a case manager who may be able to help with getting your Jardiance at a affordable price - Please schedule your appointments with the ophthalmologist and the pulmonologist - I will put in a referral for your CT scan of your lungs that you get yearly. - Please go to the pharmacy to get your dose of shingles vaccine so that you do not get charged for it.  Have a great day,  Frederic Jericho, MD

## 2023-02-07 NOTE — Assessment & Plan Note (Signed)
Patient complains of uncontrollable urge to move her legs when laying down at night.  Not painful.  Will check folate, B12, magnesium, vitamin D level then consider supplementation.  If necessary will prescribe Requip.

## 2023-02-07 NOTE — Progress Notes (Unsigned)
Established Patient Office Visit  Subjective   Patient ID: Kaitlin Cantrell, female    DOB: 1955/05/30  Age: 67 y.o. MRN: [id number]  Chief Complaint  Patient presents with   Medical Management of Chronic Issues    HPI  DM-patient received her continuous glucose monitor.  She will put the sensor on but has not set up the app or account yet.  Her granddaughter is going to help her with this.  Advised patient that in order to be alerted to low blood sugars she will need to have the app activated.  We discussed Jardiance and ways to help with affordability.  Patient states that in the hospital she was advised not to take this medication by a provider.  We discussed how this may have been due to her creatinine levels at the time.  She has no history of recurrent UTIs.  Hypertension-patient taking her amlodipine.  States it is working well.  No side effects.  No concerns.  Normocytic anemia-we discussed that patient has had a normocytic anemia going back over a decade.  She had 1 point was taking B12 but has not in a while.  We discussed rechecking some lab values and treating for causes of anemia as needed.  Discussed going back to the gastroenterologist for a repeat colonoscopy as she is due for 1.  Patient has heard from the ophthalmologist but has not scheduled a appointment with them.  She also heard from the pulmonologist but again has not scheduled an appointment with them.  We discussed calling them to schedule those appointments.  Restless leg-patient states that at night she feels like she has "frog legs".  Cannot keep her legs still.  She has to move them or get up out of bed and walk around.  They do not hurt, just restless.  Back pain with sciatica-patient states that she has a stabbing pain that travels down the front of her leg in addition to her chronic back pain.  She takes Flexeril and gabapentin 3 times a day each.  Has seen orthopedist in the past.  Does not member who it was.   She states they "burned her nerves" with a procedure to help with back pain in the past but this did not provide relief.  We discussed the patient's shingles vaccination status.  She would not be able to get a Shingrix vaccine here with an out-of-pocket cost.  Recommended she go to the pharmacy to have that done.  The 10-year ASCVD risk score (Arnett DK, et al., 2019) is: 22.6%  Health Maintenance Due  Topic Date Due   COVID-19 Vaccine (1) Never done   Cervical Cancer Screening (HPV/Pap Cotest)  03/10/2014   OPHTHALMOLOGY EXAM  03/30/2017   Medicare Annual Wellness (AWV)  12/28/2017   INFLUENZA VACCINE  12/01/2022   DEXA SCAN  Never done   Diabetic kidney evaluation - Urine ACR  02/03/2023   Lung Cancer Screening  03/01/2023      Objective:     BP 121/65   Pulse (!) 55   Ht 5\' 6"  (1.676 m)   Wt 262 lb 1.9 oz (118.9 kg)   SpO2 97%   BMI 42.31 kg/m  {Vitals History (Optional):23777}  Physical Exam General: Alert, oriented Extremities: Normal foot exam, no ulcerations.  Sensation intact. Psych: Pleasant affect, spontaneous speech.   No results found for any visits on 02/07/23.      Assessment & Plan:   Normocytic anemia Assessment & Plan: Has a longstanding  normocytic anemia going back over a decade.  I see ferritin has been ordered once in the distant past and was low at 1 point.  Most recent value was 67 3 years ago.Marland Kitchen  B12 was also mildly low several years ago.  Will recheck iron panel and B12, folate.  Continue workup as directed pending these studies.  Orders: -     Iron, TIBC and Ferritin Panel  Chronic pain syndrome -     Gabapentin; Take 1 capsule (400 mg total) by mouth 3 (three) times daily.  Dispense: 90 capsule; Refill: 1  Need for shingles vaccine  Type 2 diabetes mellitus without complication, without long-term current use of insulin (HCC) -     Microalbumin / creatinine urine ratio  Restless leg Assessment & Plan: Patient complains of  uncontrollable urge to move her legs when laying down at night.  Not painful.  Will check folate, B12, magnesium, vitamin D level then consider supplementation.  If necessary will prescribe Requip.  Orders: -     B12 and Folate Panel -     VITAMIN D 25 Hydroxy (Vit-D Deficiency, Fractures) -     Magnesium  Smoker -     CT CHEST LUNG CANCER SCREENING LOW DOSE WO CONTRAST; Future     Return in about 4 weeks (around 03/07/2023) for DM.    [name], MD

## 2023-02-07 NOTE — Assessment & Plan Note (Signed)
Has a longstanding normocytic anemia going back over a decade.  I see ferritin has been ordered once in the distant past and was low at 1 point.  Most recent value was 67 3 years ago.Marland Kitchen  B12 was also mildly low several years ago.  Will recheck iron panel and B12, folate.  Continue workup as directed pending these studies.

## 2023-02-08 ENCOUNTER — Other Ambulatory Visit: Payer: Self-pay | Admitting: Family Medicine

## 2023-02-08 LAB — IRON,TIBC AND FERRITIN PANEL
Ferritin: 27 ng/mL (ref 15–150)
Iron Saturation: 10 % — ABNORMAL LOW (ref 15–55)
Iron: 29 ug/dL (ref 27–139)
Total Iron Binding Capacity: 281 ug/dL (ref 250–450)
UIBC: 252 ug/dL (ref 118–369)

## 2023-02-08 LAB — B12 AND FOLATE PANEL
Folate: 3.3 ng/mL (ref >3.0)
Vitamin B-12: 1007 pg/mL (ref 232–1245)

## 2023-02-08 LAB — MICROALBUMIN / CREATININE URINE RATIO
Creatinine, Urine: 35.3 mg/dL
Microalb/Creat Ratio: 40 mg/g{creat} — ABNORMAL HIGH (ref 0–29)
Microalbumin, Urine: 14.1 ug/mL

## 2023-02-08 LAB — VITAMIN D 25 HYDROXY (VIT D DEFICIENCY, FRACTURES): Vit D, 25-Hydroxy: 81.2 ng/mL (ref 30.0–100.0)

## 2023-02-08 LAB — MAGNESIUM: Magnesium: 1.7 mg/dL (ref 1.6–2.3)

## 2023-02-08 MED ORDER — MAGNESIUM OXIDE 400 MG PO CAPS: 1.0000 | ORAL_CAPSULE | Freq: Every day | ORAL | 3 refills | Status: DC

## 2023-02-08 MED ORDER — IRON (FERROUS SULFATE) 325 (65 FE) MG PO TABS: 325.0000 mg | ORAL_TABLET | Freq: Every day | ORAL | 3 refills | Status: DC

## 2023-02-20 ENCOUNTER — Inpatient Hospital Stay (HOSPITAL_COMMUNITY)
Admission: EM | Admit: 2023-02-20 | Discharge: 2023-02-28 | DRG: 193 | Disposition: A | Discharge status: Home / Self Care | Payer: PPO | Attending: Family Medicine | Admitting: Family Medicine

## 2023-02-20 ENCOUNTER — Ambulatory Visit (INDEPENDENT_AMBULATORY_CARE_PROVIDER_SITE_OTHER): Payer: PPO | Admitting: Family Medicine

## 2023-02-20 ENCOUNTER — Encounter: Payer: Self-pay | Admitting: Family Medicine

## 2023-02-20 ENCOUNTER — Emergency Department (HOSPITAL_COMMUNITY): Payer: PPO

## 2023-02-20 ENCOUNTER — Encounter (HOSPITAL_COMMUNITY): Payer: Self-pay

## 2023-02-20 VITALS — HR 103

## 2023-02-20 DIAGNOSIS — E1122 Type 2 diabetes mellitus with diabetic chronic kidney disease: Secondary | ICD-10-CM | POA: Diagnosis present

## 2023-02-20 DIAGNOSIS — D509 Iron deficiency anemia, unspecified: Secondary | ICD-10-CM | POA: Diagnosis present

## 2023-02-20 DIAGNOSIS — E785 Hyperlipidemia, unspecified: Secondary | ICD-10-CM | POA: Diagnosis present

## 2023-02-20 DIAGNOSIS — J9601 Acute respiratory failure with hypoxia: Principal | ICD-10-CM | POA: Diagnosis present

## 2023-02-20 DIAGNOSIS — E873 Alkalosis: Secondary | ICD-10-CM | POA: Diagnosis present

## 2023-02-20 DIAGNOSIS — F17210 Nicotine dependence, cigarettes, uncomplicated: Secondary | ICD-10-CM | POA: Diagnosis present

## 2023-02-20 DIAGNOSIS — Z7984 Long term (current) use of oral hypoglycemic drugs: Secondary | ICD-10-CM

## 2023-02-20 DIAGNOSIS — Z79899 Other long term (current) drug therapy: Secondary | ICD-10-CM

## 2023-02-20 DIAGNOSIS — Z96652 Presence of left artificial knee joint: Secondary | ICD-10-CM | POA: Diagnosis present

## 2023-02-20 DIAGNOSIS — Z833 Family history of diabetes mellitus: Secondary | ICD-10-CM

## 2023-02-20 DIAGNOSIS — K219 Gastro-esophageal reflux disease without esophagitis: Secondary | ICD-10-CM | POA: Diagnosis present

## 2023-02-20 DIAGNOSIS — Z885 Allergy status to narcotic agent status: Secondary | ICD-10-CM

## 2023-02-20 DIAGNOSIS — R918 Other nonspecific abnormal finding of lung field: Secondary | ICD-10-CM | POA: Diagnosis not present

## 2023-02-20 DIAGNOSIS — J441 Chronic obstructive pulmonary disease with (acute) exacerbation: Secondary | ICD-10-CM | POA: Diagnosis present

## 2023-02-20 DIAGNOSIS — J189 Pneumonia, unspecified organism: Secondary | ICD-10-CM | POA: Insufficient documentation

## 2023-02-20 DIAGNOSIS — E1140 Type 2 diabetes mellitus with diabetic neuropathy, unspecified: Secondary | ICD-10-CM | POA: Diagnosis present

## 2023-02-20 DIAGNOSIS — I4892 Unspecified atrial flutter: Secondary | ICD-10-CM | POA: Diagnosis present

## 2023-02-20 DIAGNOSIS — Z1152 Encounter for screening for COVID-19: Secondary | ICD-10-CM

## 2023-02-20 DIAGNOSIS — Z7951 Long term (current) use of inhaled steroids: Secondary | ICD-10-CM

## 2023-02-20 DIAGNOSIS — I5041 Acute combined systolic (congestive) and diastolic (congestive) heart failure: Secondary | ICD-10-CM

## 2023-02-20 DIAGNOSIS — E1165 Type 2 diabetes mellitus with hyperglycemia: Secondary | ICD-10-CM | POA: Diagnosis present

## 2023-02-20 DIAGNOSIS — I5043 Acute on chronic combined systolic (congestive) and diastolic (congestive) heart failure: Secondary | ICD-10-CM | POA: Diagnosis present

## 2023-02-20 DIAGNOSIS — Z8616 Personal history of COVID-19: Secondary | ICD-10-CM

## 2023-02-20 DIAGNOSIS — Z7901 Long term (current) use of anticoagulants: Secondary | ICD-10-CM

## 2023-02-20 DIAGNOSIS — Z8249 Family history of ischemic heart disease and other diseases of the circulatory system: Secondary | ICD-10-CM

## 2023-02-20 DIAGNOSIS — E875 Hyperkalemia: Secondary | ICD-10-CM | POA: Diagnosis present

## 2023-02-20 DIAGNOSIS — J449 Chronic obstructive pulmonary disease, unspecified: Secondary | ICD-10-CM | POA: Diagnosis not present

## 2023-02-20 DIAGNOSIS — Z86718 Personal history of other venous thrombosis and embolism: Secondary | ICD-10-CM

## 2023-02-20 DIAGNOSIS — Z825 Family history of asthma and other chronic lower respiratory diseases: Secondary | ICD-10-CM

## 2023-02-20 DIAGNOSIS — N1832 Chronic kidney disease, stage 3b: Secondary | ICD-10-CM | POA: Diagnosis present

## 2023-02-20 DIAGNOSIS — R059 Cough, unspecified: Secondary | ICD-10-CM | POA: Diagnosis not present

## 2023-02-20 DIAGNOSIS — Z6841 Body Mass Index (BMI) 40.0 and over, adult: Secondary | ICD-10-CM

## 2023-02-20 DIAGNOSIS — J440 Chronic obstructive pulmonary disease with acute lower respiratory infection: Secondary | ICD-10-CM | POA: Diagnosis present

## 2023-02-20 DIAGNOSIS — I130 Hypertensive heart and chronic kidney disease with heart failure and stage 1 through stage 4 chronic kidney disease, or unspecified chronic kidney disease: Secondary | ICD-10-CM | POA: Diagnosis present

## 2023-02-20 DIAGNOSIS — Z888 Allergy status to other drugs, medicaments and biological substances status: Secondary | ICD-10-CM

## 2023-02-20 DIAGNOSIS — R0602 Shortness of breath: Secondary | ICD-10-CM | POA: Diagnosis not present

## 2023-02-20 DIAGNOSIS — I472 Ventricular tachycardia, unspecified: Secondary | ICD-10-CM | POA: Diagnosis present

## 2023-02-20 DIAGNOSIS — I480 Paroxysmal atrial fibrillation: Secondary | ICD-10-CM | POA: Diagnosis present

## 2023-02-20 DIAGNOSIS — Z794 Long term (current) use of insulin: Secondary | ICD-10-CM

## 2023-02-20 DIAGNOSIS — Z86711 Personal history of pulmonary embolism: Secondary | ICD-10-CM

## 2023-02-20 LAB — BASIC METABOLIC PANEL
Anion gap: 11 (ref 5–15)
BUN: 20 mg/dL (ref 8–23)
CO2: 26 mmol/L (ref 22–32)
Calcium: 8.7 mg/dL — ABNORMAL LOW (ref 8.9–10.3)
Chloride: 97 mmol/L — ABNORMAL LOW (ref 98–111)
Creatinine, Ser: 1.47 mg/dL — ABNORMAL HIGH (ref 0.44–1.00)
GFR, Estimated: 39 mL/min — ABNORMAL LOW (ref >60)
Glucose, Bld: 214 mg/dL — ABNORMAL HIGH (ref 70–99)
Potassium: 3.8 mmol/L (ref 3.5–5.1)
Sodium: 134 mmol/L — ABNORMAL LOW (ref 135–145)

## 2023-02-20 LAB — CBC
HCT: 31.8 % — ABNORMAL LOW (ref 36.0–46.0)
Hemoglobin: 9.2 g/dL — ABNORMAL LOW (ref 12.0–15.0)
MCH: 24.5 pg — ABNORMAL LOW (ref 26.0–34.0)
MCHC: 28.9 g/dL — ABNORMAL LOW (ref 30.0–36.0)
MCV: 84.6 fL (ref 80.0–100.0)
Platelets: 339 10*3/uL (ref 150–400)
RBC: 3.76 MIL/uL — ABNORMAL LOW (ref 3.87–5.11)
RDW: 16.6 % — ABNORMAL HIGH (ref 11.5–15.5)
WBC: 10.5 10*3/uL (ref 4.0–10.5)
nRBC: 0 % (ref 0.0–0.2)

## 2023-02-20 LAB — POC COVID19 BINAXNOW: SARS Coronavirus 2 Ag: NEGATIVE

## 2023-02-20 LAB — POCT INFLUENZA A/B
Influenza A, POC: NEGATIVE
Influenza B, POC: NEGATIVE

## 2023-02-20 LAB — TROPONIN I (HIGH SENSITIVITY)
Troponin I (High Sensitivity): 17 ng/L (ref <18)
Troponin I (High Sensitivity): 16 ng/L (ref <18)

## 2023-02-20 NOTE — Assessment & Plan Note (Signed)
Patient with 5 days of productive cough, shortness of breath, chills.  Today on exam she had rhonchi bilaterally, right side greater than left.  She was hypoxic to 86%.  Patient recently with COVID pneumonia hospitalization approximately 6 weeks ago.  Flu and COVID-negative on rapid testing.  Based on the patient's comorbidities (COPD,, HFrEF with recovered EF) her recent hospitalization for pneumonia, her hypoxia, mild tachycardia, I recommended she go to the emergency department to be evaluated for potentially needing inpatient management of community-acquired pneumonia or other respiratory infection causing hypoxia.

## 2023-02-20 NOTE — Progress Notes (Addendum)
Acute Office Visit  Subjective:     Patient ID: Kaitlin Cantrell, female    DOB: 09/20/1955, 67 y.o.   MRN: [id number]  Chief Complaint  Patient presents with   Shortness of Breath    HPI Patient is in today for cough, shortness of breath.  Patient states that for the past 5 days she has had worsening cough and shortness of breath.  States that her cough is productive of yellowish sputum.  She has not taken her temperature but has had chills in her house.  She feels like she is more short of breath and gets tired more easily the last 5 days.  She was recently discharged from the hospital on 9/8 for COVID pneumonia.  There is a mention in her note about supplemental oxygen at home but patient does not use supplemental oxygen at home.  She does not have an O2 tank.  Patient waited in her car while she was swabbed for COVID and flu.  These were negative.  Her pulse ox was taken and it was 86%. ROS      Objective:    Pulse (!) 103   SpO2 (!) 86%    Physical Exam General: Alert, oriented CV: Regular in rhythm Pulmonary: Rhonchorous bilaterally.  Coughing. Extremities: Tense edema bilaterally of the lower extremities.  No results found for any visits on 02/20/23.      Assessment & Plan:   Pneumonia of both lungs due to infectious organism, unspecified part of lung Assessment & Plan: Patient with 5 days of productive cough, shortness of breath, chills.  Today on exam she had rhonchi bilaterally, right side greater than left.  She was hypoxic to 86%.  Patient recently with COVID pneumonia hospitalization approximately 6 weeks ago.  Flu and COVID-negative on rapid testing.  Based on the patient's comorbidities (COPD,, HFrEF with recovered EF) her recent hospitalization for pneumonia, her hypoxia, mild tachycardia, I recommended she go to the emergency department to be evaluated for potentially needing inpatient management of community-acquired pneumonia or other respiratory  infection causing hypoxia.      No follow-ups on file.  [name], MD

## 2023-02-20 NOTE — ED Notes (Signed)
PA-C Shari at bedside for MSE screening in triage

## 2023-02-20 NOTE — ED Triage Notes (Signed)
Pt arrived POV for SOB few days, pt has COPD and CHF, pt with congested non-productive cough. Faint inspiratory wheezing noted. Pt 84% on RA, placed on 2 2L [place], sats increased to 95%, pt does not wear home 02. Pt denies CP, no n/d. Does have hx of chronic back pain, not new tonight. A&O x4, otherwise VSS.

## 2023-02-20 NOTE — ED Provider Triage Note (Signed)
Emergency Medicine Provider Triage Evaluation Note  Kaitlin Cantrell , a 67 y.o. female  was evaluated in triage.  Pt complains of SOB, cough .  Review of Systems  Positive: Sob, cough, chest tightness Negative: Vomiting, fever, night sweats  Physical Exam  BP (!) 140/74 (BP Location: Right Arm)   Pulse 94   Temp 98.9 F (37.2 C) (Oral)   Resp (!) 22   Ht 5\' 6"  (1.676 m)   Wt 117.9 kg   SpO2 94%   BMI 41.97 kg/m  Gen:   Awake, no distress   Resp:  Mildly dyspneic with speaking, actively coughing - coarse MSK:   Moves extremities without difficulty  Other:    Medical Decision Making  Medically screening exam initiated at 8:29 PM.  Appropriate orders placed.  Kaitlin Cantrell was informed that the remainder of the evaluation will be completed by another provider, this initial triage assessment does not replace that evaluation, and the importance of remaining in the ED until their evaluation is complete.  Patient with COPD, continuous smoker, presents with SOB and chest tightness steadily worsening over the last 5 days. She reports having hospitalization for COVID and PNA in September and has not ever returned to baseline. Seen by her PCP today and sent to the hospital. Reported to be 84% on RA on arrival. 95% on 2L.    Kaitlin Anis, PA-C 02/20/23 2031

## 2023-02-21 ENCOUNTER — Other Ambulatory Visit: Payer: Self-pay

## 2023-02-21 ENCOUNTER — Emergency Department (HOSPITAL_COMMUNITY): Payer: PPO

## 2023-02-21 DIAGNOSIS — I472 Ventricular tachycardia, unspecified: Secondary | ICD-10-CM | POA: Diagnosis not present

## 2023-02-21 DIAGNOSIS — Z8616 Personal history of COVID-19: Secondary | ICD-10-CM | POA: Diagnosis not present

## 2023-02-21 DIAGNOSIS — R0602 Shortness of breath: Secondary | ICD-10-CM | POA: Diagnosis not present

## 2023-02-21 DIAGNOSIS — Z1152 Encounter for screening for COVID-19: Secondary | ICD-10-CM | POA: Diagnosis not present

## 2023-02-21 DIAGNOSIS — J9601 Acute respiratory failure with hypoxia: Principal | ICD-10-CM | POA: Diagnosis present

## 2023-02-21 DIAGNOSIS — Z794 Long term (current) use of insulin: Secondary | ICD-10-CM | POA: Diagnosis not present

## 2023-02-21 DIAGNOSIS — J449 Chronic obstructive pulmonary disease, unspecified: Secondary | ICD-10-CM | POA: Diagnosis not present

## 2023-02-21 DIAGNOSIS — J441 Chronic obstructive pulmonary disease with (acute) exacerbation: Secondary | ICD-10-CM | POA: Diagnosis not present

## 2023-02-21 DIAGNOSIS — E873 Alkalosis: Secondary | ICD-10-CM | POA: Diagnosis not present

## 2023-02-21 DIAGNOSIS — J189 Pneumonia, unspecified organism: Secondary | ICD-10-CM | POA: Diagnosis not present

## 2023-02-21 DIAGNOSIS — I130 Hypertensive heart and chronic kidney disease with heart failure and stage 1 through stage 4 chronic kidney disease, or unspecified chronic kidney disease: Secondary | ICD-10-CM | POA: Diagnosis not present

## 2023-02-21 DIAGNOSIS — I5043 Acute on chronic combined systolic (congestive) and diastolic (congestive) heart failure: Secondary | ICD-10-CM | POA: Diagnosis not present

## 2023-02-21 DIAGNOSIS — Z6841 Body Mass Index (BMI) 40.0 and over, adult: Secondary | ICD-10-CM | POA: Diagnosis not present

## 2023-02-21 DIAGNOSIS — I2510 Atherosclerotic heart disease of native coronary artery without angina pectoris: Secondary | ICD-10-CM | POA: Diagnosis not present

## 2023-02-21 DIAGNOSIS — E1140 Type 2 diabetes mellitus with diabetic neuropathy, unspecified: Secondary | ICD-10-CM | POA: Diagnosis not present

## 2023-02-21 DIAGNOSIS — E1122 Type 2 diabetes mellitus with diabetic chronic kidney disease: Secondary | ICD-10-CM | POA: Diagnosis not present

## 2023-02-21 DIAGNOSIS — E875 Hyperkalemia: Secondary | ICD-10-CM | POA: Diagnosis not present

## 2023-02-21 DIAGNOSIS — J440 Chronic obstructive pulmonary disease with acute lower respiratory infection: Secondary | ICD-10-CM | POA: Diagnosis not present

## 2023-02-21 DIAGNOSIS — I480 Paroxysmal atrial fibrillation: Secondary | ICD-10-CM | POA: Diagnosis not present

## 2023-02-21 DIAGNOSIS — N1832 Chronic kidney disease, stage 3b: Secondary | ICD-10-CM | POA: Diagnosis not present

## 2023-02-21 DIAGNOSIS — R059 Cough, unspecified: Secondary | ICD-10-CM | POA: Diagnosis not present

## 2023-02-21 DIAGNOSIS — I700 Atherosclerosis of aorta: Secondary | ICD-10-CM | POA: Diagnosis not present

## 2023-02-21 DIAGNOSIS — F17210 Nicotine dependence, cigarettes, uncomplicated: Secondary | ICD-10-CM | POA: Diagnosis not present

## 2023-02-21 DIAGNOSIS — R918 Other nonspecific abnormal finding of lung field: Secondary | ICD-10-CM | POA: Diagnosis not present

## 2023-02-21 DIAGNOSIS — E1165 Type 2 diabetes mellitus with hyperglycemia: Secondary | ICD-10-CM | POA: Diagnosis not present

## 2023-02-21 DIAGNOSIS — D509 Iron deficiency anemia, unspecified: Secondary | ICD-10-CM | POA: Diagnosis not present

## 2023-02-21 DIAGNOSIS — I4892 Unspecified atrial flutter: Secondary | ICD-10-CM | POA: Diagnosis not present

## 2023-02-21 DIAGNOSIS — K449 Diaphragmatic hernia without obstruction or gangrene: Secondary | ICD-10-CM | POA: Diagnosis not present

## 2023-02-21 DIAGNOSIS — E785 Hyperlipidemia, unspecified: Secondary | ICD-10-CM | POA: Diagnosis not present

## 2023-02-21 DIAGNOSIS — Z96652 Presence of left artificial knee joint: Secondary | ICD-10-CM | POA: Diagnosis not present

## 2023-02-21 LAB — GLUCOSE, CAPILLARY
Glucose-Capillary: 326 mg/dL — ABNORMAL HIGH (ref 70–99)
Glucose-Capillary: 317 mg/dL — ABNORMAL HIGH (ref 70–99)
Glucose-Capillary: 344 mg/dL — ABNORMAL HIGH (ref 70–99)
Glucose-Capillary: 352 mg/dL — ABNORMAL HIGH (ref 70–99)

## 2023-02-21 LAB — CBG MONITORING, ED: Glucose-Capillary: 269 mg/dL — ABNORMAL HIGH (ref 70–99)

## 2023-02-21 LAB — BRAIN NATRIURETIC PEPTIDE: B Natriuretic Peptide: 179.8 pg/mL — ABNORMAL HIGH (ref 0.0–100.0)

## 2023-02-21 LAB — D-DIMER, QUANTITATIVE: D-Dimer, Quant: 1.54 ug{FEU}/mL — ABNORMAL HIGH (ref 0.00–0.50)

## 2023-02-21 MED ORDER — ATORVASTATIN CALCIUM 80 MG PO TABS
80.0000 mg | ORAL_TABLET | Freq: Every day | ORAL | Status: DC
  Administered 2023-02-21 – 2023-02-27 (×7): 80 mg via ORAL
  Filled 2023-02-21 (×7): qty 1

## 2023-02-21 MED ORDER — PROCHLORPERAZINE EDISYLATE 10 MG/2ML IJ SOLN: 5.0000 mg | Freq: Four times a day (QID) | INTRAMUSCULAR | Status: DC | PRN

## 2023-02-21 MED ORDER — INSULIN ASPART 100 UNIT/ML IJ SOLN: 0.0000 [IU] | Freq: Every day | INTRAMUSCULAR | Status: DC

## 2023-02-21 MED ORDER — INSULIN ASPART 100 UNIT/ML IJ SOLN
0.0000 [IU] | Freq: Every day | INTRAMUSCULAR | Status: DC
  Administered 2023-02-21: 5 [IU] via SUBCUTANEOUS
  Administered 2023-02-22: 3 [IU] via SUBCUTANEOUS
  Administered 2023-02-24: 2 [IU] via SUBCUTANEOUS

## 2023-02-21 MED ORDER — GUAIFENESIN-DM 100-10 MG/5ML PO SYRP
5.0000 mL | ORAL_SOLUTION | ORAL | Status: DC | PRN
  Administered 2023-02-21 – 2023-02-22 (×2): 5 mL via ORAL
  Filled 2023-02-21 (×2): qty 10

## 2023-02-21 MED ORDER — INSULIN ASPART 100 UNIT/ML IJ SOLN
0.0000 [IU] | Freq: Three times a day (TID) | INTRAMUSCULAR | Status: DC
  Administered 2023-02-22: 3 [IU] via SUBCUTANEOUS
  Administered 2023-02-22 (×2): 15 [IU] via SUBCUTANEOUS
  Administered 2023-02-23: 3 [IU] via SUBCUTANEOUS
  Administered 2023-02-23: 5 [IU] via SUBCUTANEOUS
  Administered 2023-02-23: 8 [IU] via SUBCUTANEOUS
  Administered 2023-02-24: 15 [IU] via SUBCUTANEOUS
  Administered 2023-02-24: 3 [IU] via SUBCUTANEOUS
  Administered 2023-02-24: 15 [IU] via SUBCUTANEOUS
  Administered 2023-02-25: 3 [IU] via SUBCUTANEOUS
  Administered 2023-02-25: 2 [IU] via SUBCUTANEOUS
  Administered 2023-02-25 – 2023-02-27 (×5): 5 [IU] via SUBCUTANEOUS
  Administered 2023-02-27: 8 [IU] via SUBCUTANEOUS
  Administered 2023-02-27: 5 [IU] via SUBCUTANEOUS
  Administered 2023-02-28: 3 [IU] via SUBCUTANEOUS

## 2023-02-21 MED ORDER — MAGNESIUM OXIDE -MG SUPPLEMENT 400 (240 MG) MG PO TABS
400.0000 mg | ORAL_TABLET | Freq: Every day | ORAL | Status: DC
  Administered 2023-02-21 – 2023-02-24 (×4): 400 mg via ORAL
  Filled 2023-02-21 (×5): qty 1

## 2023-02-21 MED ORDER — METOPROLOL SUCCINATE ER 25 MG PO TB24
12.5000 mg | ORAL_TABLET | Freq: Every day | ORAL | Status: DC
  Administered 2023-02-21 – 2023-02-24 (×4): 12.5 mg via ORAL
  Filled 2023-02-21 (×4): qty 1

## 2023-02-21 MED ORDER — INSULIN GLARGINE-YFGN 100 UNIT/ML ~~LOC~~ SOLN
25.0000 [IU] | Freq: Every day | SUBCUTANEOUS | Status: DC
  Administered 2023-02-21 – 2023-02-23 (×3): 25 [IU] via SUBCUTANEOUS
  Filled 2023-02-21 (×3): qty 0.25

## 2023-02-21 MED ORDER — FUROSEMIDE 40 MG PO TABS
40.0000 mg | ORAL_TABLET | Freq: Every day | ORAL | Status: DC
  Administered 2023-02-21 – 2023-02-22 (×2): 40 mg via ORAL
  Filled 2023-02-21 (×2): qty 1

## 2023-02-21 MED ORDER — MELATONIN 5 MG PO TABS: 5.0000 mg | ORAL_TABLET | Freq: Every evening | ORAL | Status: DC | PRN

## 2023-02-21 MED ORDER — SODIUM CHLORIDE 0.9 % IV SOLN
500.0000 mg | INTRAVENOUS | Status: DC
  Administered 2023-02-21 – 2023-02-22 (×2): 500 mg via INTRAVENOUS
  Filled 2023-02-21 (×2): qty 5

## 2023-02-21 MED ORDER — RIVAROXABAN 10 MG PO TABS
20.0000 mg | ORAL_TABLET | Freq: Every day | ORAL | Status: DC
  Administered 2023-02-21 – 2023-02-28 (×8): 20 mg via ORAL
  Filled 2023-02-21 (×8): qty 2

## 2023-02-21 MED ORDER — AMOXICILLIN-POT CLAVULANATE 875-125 MG PO TABS: 1.0000 | ORAL_TABLET | Freq: Once | ORAL | Status: DC

## 2023-02-21 MED ORDER — METHYLPREDNISOLONE SODIUM SUCC 40 MG IJ SOLR
40.0000 mg | Freq: Every day | INTRAMUSCULAR | Status: DC
  Administered 2023-02-22: 40 mg via INTRAVENOUS
  Filled 2023-02-21: qty 1

## 2023-02-21 MED ORDER — SODIUM CHLORIDE 0.9 % IV SOLN
2.0000 g | INTRAVENOUS | Status: DC
  Administered 2023-02-21 – 2023-02-22 (×2): 2 g via INTRAVENOUS
  Filled 2023-02-21 (×2): qty 20

## 2023-02-21 MED ORDER — POLYETHYLENE GLYCOL 3350 17 G PO PACK
17.0000 g | PACK | Freq: Every day | ORAL | Status: DC | PRN
  Filled 2023-02-21: qty 1

## 2023-02-21 MED ORDER — INSULIN GLARGINE-YFGN 100 UNIT/ML ~~LOC~~ SOLN
15.0000 [IU] | Freq: Every day | SUBCUTANEOUS | Status: DC
  Filled 2023-02-21: qty 0.15

## 2023-02-21 MED ORDER — IOHEXOL 350 MG/ML SOLN
60.0000 mL | Freq: Once | INTRAVENOUS | Status: AC | PRN
  Administered 2023-02-21: 60 mL via INTRAVENOUS

## 2023-02-21 MED ORDER — GABAPENTIN 100 MG PO CAPS
200.0000 mg | ORAL_CAPSULE | Freq: Two times a day (BID) | ORAL | Status: DC
  Administered 2023-02-21 – 2023-02-28 (×15): 200 mg via ORAL
  Filled 2023-02-21 (×15): qty 2

## 2023-02-21 MED ORDER — FERROUS SULFATE 325 (65 FE) MG PO TABS
325.0000 mg | ORAL_TABLET | Freq: Every day | ORAL | Status: DC
  Administered 2023-02-21 – 2023-02-28 (×8): 325 mg via ORAL
  Filled 2023-02-21 (×8): qty 1

## 2023-02-21 MED ORDER — METHYLPREDNISOLONE SODIUM SUCC 125 MG IJ SOLR
125.0000 mg | Freq: Once | INTRAMUSCULAR | Status: AC
Start: 2023-02-21 — End: 2023-02-21
  Administered 2023-02-21: 125 mg via INTRAVENOUS
  Filled 2023-02-21: qty 2

## 2023-02-21 MED ORDER — SACUBITRIL-VALSARTAN 97-103 MG PO TABS
1.0000 | ORAL_TABLET | Freq: Two times a day (BID) | ORAL | Status: DC
  Administered 2023-02-21: 1 via ORAL
  Filled 2023-02-21 (×2): qty 1

## 2023-02-21 MED ORDER — INSULIN ASPART 100 UNIT/ML IJ SOLN
0.0000 [IU] | Freq: Three times a day (TID) | INTRAMUSCULAR | Status: DC
  Administered 2023-02-21: 5 [IU] via SUBCUTANEOUS
  Administered 2023-02-21: 7 [IU] via SUBCUTANEOUS
  Administered 2023-02-21: 4 [IU] via SUBCUTANEOUS

## 2023-02-21 MED ORDER — IPRATROPIUM BROMIDE 0.02 % IN SOLN
0.5000 mg | Freq: Once | RESPIRATORY_TRACT | Status: AC
  Administered 2023-02-21: 0.5 mg via RESPIRATORY_TRACT
  Filled 2023-02-21: qty 2.5

## 2023-02-21 MED ORDER — MOMETASONE FURO-FORMOTEROL FUM 200-5 MCG/ACT IN AERO
2.0000 | INHALATION_SPRAY | Freq: Two times a day (BID) | RESPIRATORY_TRACT | Status: DC
  Administered 2023-02-22 – 2023-02-25 (×7): 2 via RESPIRATORY_TRACT
  Filled 2023-02-21 (×2): qty 8.8

## 2023-02-21 MED ORDER — PANTOPRAZOLE SODIUM 40 MG PO TBEC
40.0000 mg | DELAYED_RELEASE_TABLET | Freq: Every day | ORAL | Status: DC
Start: 2023-02-21 — End: 2023-02-28
  Administered 2023-02-21 – 2023-02-28 (×8): 40 mg via ORAL
  Filled 2023-02-21 (×8): qty 1

## 2023-02-21 MED ORDER — GLIMEPIRIDE 4 MG PO TABS
4.0000 mg | ORAL_TABLET | Freq: Two times a day (BID) | ORAL | Status: DC
  Administered 2023-02-21 – 2023-02-28 (×15): 4 mg via ORAL
  Filled 2023-02-21 (×15): qty 1

## 2023-02-21 MED ORDER — AMLODIPINE BESYLATE 5 MG PO TABS
5.0000 mg | ORAL_TABLET | Freq: Every day | ORAL | Status: DC
  Administered 2023-02-21 – 2023-02-28 (×8): 5 mg via ORAL
  Filled 2023-02-21 (×8): qty 1

## 2023-02-21 MED ORDER — ACETAMINOPHEN 325 MG PO TABS
650.0000 mg | ORAL_TABLET | Freq: Four times a day (QID) | ORAL | Status: DC | PRN
  Administered 2023-02-23 – 2023-02-27 (×2): 650 mg via ORAL
  Filled 2023-02-21 (×2): qty 2

## 2023-02-21 NOTE — H&P (Addendum)
History and Physical  Kaitlin Cantrell KZS:[id number] DOB: 1956-03-21 DOA: 02/20/2023  Referring physician: Dr. Madilyn Hook, EDP  PCP: [name], MD  Outpatient Specialists: Cardiology Patient coming from: Home  Chief Complaint: Shortness of breath and productive cough  HPI: Kaitlin Cantrell is a 67 y.o. female with medical history significant for current tobacco user 1 pack/day, COPD, hypertension, type 2 diabetes, HFpEF(recovered HFrEF), CKD 3B, history of pulmonary embolism, paroxysmal A-fib on Xarelto, who presents to the ED from home with complaints of progressive dyspnea for the past several days.  Associated with a productive cough.  She took Mucinex for her cough with no significant relief of her symptoms.  Denies having any chest pain.  In the ED, tachypneic with new hypoxia 84% on room air, requiring 3 L to maintain her O2 saturation above 92%.  CT angio chest negative for acute pulmonary embolism however showed findings suggestive of multifocal pneumonia.  The patient was started on empiric IV antibiotics.  Admitted by [facility], hospitalist service.  ED Course: Temperature 98.6.  BP 141/85, pulse 96, respiratory rate 30, saturation 92% on 3 L.  Lab studies notable for serum sodium 134, glucose 214, creatinine 1.47, GFR 39, BNP 179.  Hemoglobin 9.2, WBC 10.5, platelet count 339.  Review of Systems: Review of systems as noted in the HPI. All other systems reviewed and are negative.   Past Medical History:  Diagnosis Date   Allergy    seasonal   Arthritis    Diabetes mellitus    GERD (gastroesophageal reflux disease)    History of pneumonia    15 years ago   Hyperlipidemia    Hypertension    PE (pulmonary embolism) 12/2015   Past Surgical History:  Procedure Laterality Date   ANKLE FRACTURE SURGERY  1974   left and pinned then pins removed   ANKLE GANGLION CYST EXCISION  1994   right   BACK SURGERY  1998   discectomy and then fusion   CARDIAC CATHETERIZATION N/A  04/05/2016   Procedure: Right/Left Heart Cath and Coronary Angiography;  Surgeon: Laurey Morale, MD;  Location: [facility] INVASIVE CV LAB;  Service: Cardiovascular;  Laterality: N/A;   CARDIOVERSION N/A 01/02/2020   Procedure: CARDIOVERSION;  Surgeon: Laurey Morale, MD;  Location: [facility] ENDOSCOPY;  Service: Cardiovascular;  Laterality: N/A;   KNEE ARTHROPLASTY  02/14/2012   Procedure: COMPUTER ASSISTED TOTAL KNEE ARTHROPLASTY;  Surgeon: Cammy Copa, MD;  Location: [facility] OR;  Service: Orthopedics;  Laterality: Left;  Left total knee arthroplasty   TEE WITHOUT CARDIOVERSION N/A 01/02/2020   Procedure: TRANSESOPHAGEAL ECHOCARDIOGRAM (TEE);  Surgeon: Laurey Morale, MD;  Location: [facility] ENDOSCOPY;  Service: Cardiovascular;  Laterality: N/A;   TONSILLECTOMY  1972   TUBAL LIGATION  1996   UMBILICAL HERNIA REPAIR  1996    Social History:  reports that she has been smoking cigarettes. She has a 45 pack-year smoking history. She has been exposed to tobacco smoke. She has never used smokeless tobacco. She reports current alcohol use. She reports that she does not use drugs.   Allergies  Allergen Reactions   Albuterol Anaphylaxis    Throat/lips swelling   Morphine And Codeine Other (See Comments)    Very aggressive and doesn't help pain    Family History  Problem Relation Age of Onset   Colon polyps Father    Diabetes Father        mother   Heart disease Father    Heart murmur Mother  Diabetes Mother    Hypertension Mother    Diabetes Brother    Cancer Maternal Grandmother    Diabetes Paternal Grandmother    Heart disease Paternal Grandmother    COPD Paternal Grandfather    Heart disease Paternal Grandfather    Hypertension Brother    Hypertension Brother       Prior to Admission medications   Medication Sig Start Date End Date Taking? Authorizing Provider  amLODipine (NORVASC) 5 MG tablet Take 1 tablet (5 mg total) by mouth daily. 01/10/23   [name], MD  atorvastatin (LIPITOR)  80 MG tablet TAKE 1 TABLET BY MOUTH ONCE DAILY AT  6PM Patient taking differently: Take 80 mg by mouth at bedtime. 04/04/22   Boscia, Kathlynn Grate, NP  budesonide (PULMICORT) 0.25 MG/2ML nebulizer solution Take 4 mLs (0.5 mg total) by nebulization daily. Patient taking differently: Take 0.25 mg by nebulization daily. 08/04/22   Carlean Jews, NP  budesonide-formoterol (SYMBICORT) 160-4.5 MCG/ACT inhaler Inhale 1 puff into the lungs 2 (two) times daily. Patient taking differently: Inhale 2 puffs into the lungs daily. 05/24/22   Carlean Jews, NP  Continuous Glucose Receiver (FREESTYLE LIBRE 3 READER) DEVI 1 Device by Does not apply route continuous. 01/10/23   [name], MD  Continuous Glucose Sensor (FREESTYLE LIBRE 3 SENSOR) MISC Place 1 sensor on the skin every 14 days. Use to check glucose continuously 01/10/23   [name], MD  cyclobenzaprine (FLEXERIL) 10 MG tablet Take 1 tablet (10 mg total) by mouth 3 (three) times daily as needed. for muscle spams Patient taking differently: Take 10 mg by mouth in the morning, at noon, and at bedtime. 12/13/22   [name], MD  ergocalciferol (DRISDOL) 1.25 MG (50000 UT) capsule Take 1 capsule (50,000 Units total) by mouth once a week. 08/04/22   Carlean Jews, NP  furosemide (LASIX) 40 MG tablet Take 1 tablet (40 mg total) by mouth daily. 08/04/22   Carlean Jews, NP  gabapentin (NEURONTIN) 400 MG capsule Take 1 capsule (400 mg total) by mouth 3 (three) times daily. 02/07/23   [name], MD  glimepiride (AMARYL) 4 MG tablet Take 1 tablet (4 mg total) by mouth 2 (two) times daily. 04/04/22   Carlean Jews, NP  insulin glargine (LANTUS SOLOSTAR) 100 UNIT/ML Solostar Pen Inject 25 Units into the skin daily. Patient taking differently: Inject 25 Units into the skin at bedtime. 08/04/22   Carlean Jews, NP  Insulin Pen Needle (PEN NEEDLES) 32G X 4 MM MISC 1 Units by Does not apply route daily. 10/31/17   Sherren Mocha, MD  ipratropium  (ATROVENT HFA) 17 MCG/ACT inhaler Inhale 2 puffs into the lungs every 6 (six) hours as needed for wheezing. Patient taking differently: Inhale 2 puffs into the lungs daily. 08/04/22   Carlean Jews, NP  ipratropium (ATROVENT) 0.02 % nebulizer solution Take 2.5 mLs (0.5 mg total) by nebulization every 6 (six) hours as needed for wheezing or shortness of breath. Patient taking differently: Take 0.5 mg by nebulization daily. 03/07/22   Carlean Jews, NP  Iron, Ferrous Sulfate, 325 (65 Fe) MG TABS Take 325 mg by mouth daily. 02/08/23   [name], MD  [place] [facility] CAPS Take 1 capsule by mouth daily.     [provider]  Magnesium Oxide 400 MG CAPS Take 1 capsule (400 mg total) by mouth at bedtime. 02/08/23   [name], MD  metFORMIN (  GLUCOPHAGE) 850 MG tablet Take 1 tablet (850 mg total) by mouth 2 (two) times daily with a meal. 08/04/22   Carlean Jews, NP  metoprolol (TOPROL-XL) 200 MG 24 hr tablet Take 1 tablet (200 mg total) by mouth daily. 08/04/22   Carlean Jews, NP  omeprazole (PRILOSEC) 20 MG capsule Take 20 mg by mouth 2 (two) times daily before a meal.    [provider]  rivaroxaban (XARELTO) 20 MG TABS tablet Take 1 tablet (20 mg total) by mouth daily. 08/04/22   Carlean Jews, NP  sacubitril-valsartan (ENTRESTO) 97-103 MG Take 1 tablet by mouth 2 (two) times daily. 09/07/22   Alen Bleacher, NP    Physical Exam: BP (!) 154/94   Pulse 97   Temp 98.9 F (37.2 C) (Oral)   Resp (!) 22   Ht 5\' 6"  (1.676 m)   Wt 117.9 kg   SpO2 96%   BMI 41.97 kg/m   General: 67 y.o. year-old female well developed well nourished in no acute distress.  Alert and oriented x3. Cardiovascular: Regular rate and rhythm with no rubs or gallops.  No thyromegaly or JVD noted.  1+ pitting edema in lower extremities bilaterally.   Respiratory: Diffuse wheezing and rales bilaterally.  Good inspiratory effort. Abdomen: Soft nontender nondistended with normal bowel sounds x4  quadrants. Muskuloskeletal: No cyanosis or clubbing noted bilaterally Neuro: CN II-XII intact, strength, sensation, reflexes Skin: No ulcerative lesions noted or rashes Psychiatry: Judgement and insight appear normal. Mood is appropriate for condition and setting          Labs on Admission:  Basic Metabolic Panel: Recent Labs  Lab 02/20/23 2014  NA 134*  K 3.8  CL 97*  CO2 26  GLUCOSE 214*  BUN 20  CREATININE 1.47*  CALCIUM 8.7*   Liver Function Tests: No results for input(s): "AST", "ALT", "ALKPHOS", "BILITOT", "PROT", "ALBUMIN" in the last 168 hours. No results for input(s): "LIPASE", "AMYLASE" in the last 168 hours. No results for input(s): "AMMONIA" in the last 168 hours. CBC: Recent Labs  Lab 02/20/23 2014  WBC 10.5  HGB 9.2*  HCT 31.8*  MCV 84.6  PLT 339   Cardiac Enzymes: No results for input(s): "CKTOTAL", "CKMB", "CKMBINDEX", "TROPONINI" in the last 168 hours.  BNP (last 3 results) Recent Labs    01/03/23 0447 02/21/23 0109  BNP 1,021.6* 179.8*    ProBNP (last 3 results) No results for input(s): "PROBNP" in the last 8760 hours.  CBG: No results for input(s): "GLUCAP" in the last 168 hours.  Radiological Exams on Admission: DG Chest 2 View  Result Date: 02/20/2023 CLINICAL DATA:  Shortness of breath, COPD, and CHF. Congested, nonproductive cough. EXAM: CHEST - 2 VIEW COMPARISON:  01/02/2023. FINDINGS: The heart is enlarged and mediastinal contour are within normal limits. Atherosclerotic calcification of the aorta is noted. The pulmonary vasculature is mildly distended. Increased AP diameter of the chest is noted with flattening of the diaphragms, compatible with COPD. Interstitial prominence is present bilaterally. There is mild patchy airspace disease at the lung bases. No effusion or pneumothorax. No acute osseous abnormality. IMPRESSION: 1. Cardiomegaly with distended pulmonary vasculature. 2. Interstitial prominence bilaterally with mild airspace  disease at the lung bases, possible edema or infiltrate. 3. Chronic obstructive pulmonary disease. Electronically Signed   By: Thornell Sartorius M.D.   On: 02/20/2023 23:14    EKG: I independently viewed the EKG done and my findings are as followed: Normal sinus rhythm rate of 95.  Nonspecific ST-T changes.  QTc 459.  Assessment/Plan Present on Admission:  Acute hypoxic respiratory failure (HCC)  Principal Problem:   Acute hypoxic respiratory failure (HCC)  Acute hypoxic respiratory failure secondary to multifocal pneumonia, POA Not on oxygen supplementation at baseline Currently requiring 3 L to maintain O2 saturation above 92% Continue empiric IV antibiotics Rocephin and azithromycin Home bronchodilators, the patient has albuterol allergy. IV Solu-Medrol 40 mg daily Antitussives as needed Flutter valve Early mobilization as tolerated.  COPD exacerbation in the setting of multifocal pneumonia IV steroids, bronchodilators, antibiotics Current smoker 1ppd, counseled on importance of complete tobacco cessation.  Tobacco use disorder Smokes 1 pack/day Tobacco cessation counseling done at bedside.  Hypertension BP is at goal 126/60 Closely monitor vital signs.  Iron deficiency anemia Resume iron supplement.  Hyperlipidemia Resume home regimen.  GERD Resume home PPI daily  History of pulmonary embolism on Xarelto Resume home Xarelto  Paroxysmal A-fib on Eliquis Resume home Xarelto for CVA prevention Rate is currently controlled Resume home Toprol-XL at lowest dose Monitor on telemetry  HFpEF Recovered HFrEF Last 2D echo done on 06/06/2022 revealed LVEF 60 to 65% with no regional wall motion abnormalities. Resume home p.o. Lasix Replace low electrolytes as indicated Start strict I's and O's and daily weight    Time: 75 minutes.    DVT prophylaxis: Home Xarelto  Code Status: Full code  Family Communication: None at bedside  Disposition Plan: Admitted to  telemetry medical unit  Consults called: None.  Admission status: Inpatient status.   Status is: Inpatient The patient requires at least 2 midnights for further evaluation and treatment of present condition.   Darlin Drop MD [facility] Pager [phone]  If 7PM-7AM, please contact night-coverage [email or web address] Password [facility]  02/21/2023, 3:45 AM

## 2023-02-21 NOTE — ED Provider Notes (Signed)
[place] EMERGENCY DEPARTMENT AT [facility] Provider Note   CSN: [id number] Arrival date & time: 02/20/23  1937     History  Chief Complaint  Patient presents with   Shortness of Breath    Kaitlin Cantrell is a 67 y.o. female.  The history is provided by the patient and medical records.  Shortness of Breath Kaitlin Cantrell is a 67 y.o. female who presents to the Emergency Department complaining of difficulty breathing.  She presents to the ED for evaluation of difficulty breathing for the last five days, overall worsening.  She has associated cough productive of green sputum. She was hospitalized in September for COVID and had never fully recovered since then.    No fever.  No chest pain. Has LLE edema for a long time, worsening over the last month.  Complaint with home meds.  Not on oxygen.     Home Medications Prior to Admission medications   Medication Sig Start Date End Date Taking? Authorizing Provider  rivaroxaban (XARELTO) 20 MG TABS tablet Take 1 tablet (20 mg total) by mouth daily. 08/04/22  Yes Boscia, Heather E, NP  amLODipine (NORVASC) 5 MG tablet Take 1 tablet (5 mg total) by mouth daily. 01/10/23   [name], MD  atorvastatin (LIPITOR) 80 MG tablet TAKE 1 TABLET BY MOUTH ONCE DAILY AT  6PM Patient taking differently: Take 80 mg by mouth at bedtime. 04/04/22   Boscia, Kathlynn Grate, NP  budesonide (PULMICORT) 0.25 MG/2ML nebulizer solution Take 4 mLs (0.5 mg total) by nebulization daily. Patient taking differently: Take 0.25 mg by nebulization daily. 08/04/22   Carlean Jews, NP  budesonide-formoterol (SYMBICORT) 160-4.5 MCG/ACT inhaler Inhale 1 puff into the lungs 2 (two) times daily. Patient taking differently: Inhale 2 puffs into the lungs daily. 05/24/22   Carlean Jews, NP  Continuous Glucose Receiver (FREESTYLE LIBRE 3 READER) DEVI 1 Device by Does not apply route continuous. 01/10/23   [name], MD  Continuous Glucose Sensor  (FREESTYLE LIBRE 3 SENSOR) MISC Place 1 sensor on the skin every 14 days. Use to check glucose continuously 01/10/23   [name], MD  cyclobenzaprine (FLEXERIL) 10 MG tablet Take 1 tablet (10 mg total) by mouth 3 (three) times daily as needed. for muscle spams Patient taking differently: Take 10 mg by mouth in the morning, at noon, and at bedtime. 12/13/22   [name], MD  ergocalciferol (DRISDOL) 1.25 MG (50000 UT) capsule Take 1 capsule (50,000 Units total) by mouth once a week. 08/04/22   Carlean Jews, NP  furosemide (LASIX) 40 MG tablet Take 1 tablet (40 mg total) by mouth daily. 08/04/22   Carlean Jews, NP  gabapentin (NEURONTIN) 400 MG capsule Take 1 capsule (400 mg total) by mouth 3 (three) times daily. 02/07/23   [name], MD  glimepiride (AMARYL) 4 MG tablet Take 1 tablet (4 mg total) by mouth 2 (two) times daily. 04/04/22   Carlean Jews, NP  insulin glargine (LANTUS SOLOSTAR) 100 UNIT/ML Solostar Pen Inject 25 Units into the skin daily. Patient taking differently: Inject 25 Units into the skin at bedtime. 08/04/22   Carlean Jews, NP  Insulin Pen Needle (PEN NEEDLES) 32G X 4 MM MISC 1 Units by Does not apply route daily. 10/31/17   Sherren Mocha, MD  ipratropium (ATROVENT HFA) 17 MCG/ACT inhaler Inhale 2 puffs into the lungs every 6 (six) hours as needed for wheezing. Patient taking differently:  Inhale 2 puffs into the lungs daily. 08/04/22   Carlean Jews, NP  ipratropium (ATROVENT) 0.02 % nebulizer solution Take 2.5 mLs (0.5 mg total) by nebulization every 6 (six) hours as needed for wheezing or shortness of breath. Patient taking differently: Take 0.5 mg by nebulization daily. 03/07/22   Carlean Jews, NP  Iron, Ferrous Sulfate, 325 (65 Fe) MG TABS Take 325 mg by mouth daily. 02/08/23   [name], MD  [place] CAPS Take 1 capsule by mouth daily.     [provider]  Magnesium Oxide 400 MG CAPS Take 1 capsule (400 mg total) by mouth at bedtime.  02/08/23   [name], MD  metFORMIN (GLUCOPHAGE) 850 MG tablet Take 1 tablet (850 mg total) by mouth 2 (two) times daily with a meal. 08/04/22   Carlean Jews, NP  metoprolol (TOPROL-XL) 200 MG 24 hr tablet Take 1 tablet (200 mg total) by mouth daily. 08/04/22   Carlean Jews, NP  omeprazole (PRILOSEC) 20 MG capsule Take 20 mg by mouth 2 (two) times daily before a meal.    [provider]  sacubitril-valsartan (ENTRESTO) 97-103 MG Take 1 tablet by mouth 2 (two) times daily. 09/07/22   Alen Bleacher, NP      Allergies    Albuterol and Morphine and codeine    Review of Systems   Review of Systems  Respiratory:  Positive for shortness of breath.   All other systems reviewed and are negative.   Physical Exam Updated Vital Signs BP (!) 154/94   Pulse 97   Temp 98.9 F (37.2 C) (Oral)   Resp (!) 22   Ht 5\' 6"  (1.676 m)   Wt 117.9 kg   SpO2 96%   BMI 41.97 kg/m  Physical Exam Vitals and nursing note reviewed.  Constitutional:      Appearance: She is well-developed.  HENT:     Head: Normocephalic and atraumatic.  Cardiovascular:     Rate and Rhythm: Normal rate and regular rhythm.     Heart sounds: No murmur heard. Pulmonary:     Effort: Pulmonary effort is normal. No respiratory distress.     Comments: Diffuse expiratory wheezes Abdominal:     Palpations: Abdomen is soft.     Tenderness: There is no abdominal tenderness. There is no guarding or rebound.  Musculoskeletal:        General: No tenderness.     Comments: 2+ DP pulses.  Pitting edema to BLE, left greater than right.   Skin:    General: Skin is warm and dry.  Neurological:     Mental Status: She is alert and oriented to person, place, and time.  Psychiatric:        Behavior: Behavior normal.     ED Results / Procedures / Treatments   Labs (all labs ordered are listed, but only abnormal results are displayed) Labs Reviewed  BASIC METABOLIC PANEL - Abnormal; Notable for the following  components:      Result Value   Sodium 134 (*)    Chloride 97 (*)    Glucose, Bld 214 (*)    Creatinine, Ser 1.47 (*)    Calcium 8.7 (*)    GFR, Estimated 39 (*)    All other components within normal limits  CBC - Abnormal; Notable for the following components:   RBC 3.76 (*)    Hemoglobin 9.2 (*)    HCT 31.8 (*)    MCH 24.5 (*)  MCHC 28.9 (*)    RDW 16.6 (*)    All other components within normal limits  BRAIN NATRIURETIC PEPTIDE - Abnormal; Notable for the following components:   B Natriuretic Peptide 179.8 (*)    All other components within normal limits  D-DIMER, QUANTITATIVE - Abnormal; Notable for the following components:   D-Dimer, Quant 1.54 (*)    All other components within normal limits  TROPONIN I (HIGH SENSITIVITY)  TROPONIN I (HIGH SENSITIVITY)    EKG EKG Interpretation Date/Time:  Monday February 20 2023 20:23:39 EDT Ventricular Rate:  95 PR Interval:  152 QRS Duration:  120 QT Interval:  366 QTC Calculation: 459 R Axis:   -61  Text Interpretation: Normal sinus rhythm Left axis deviation Septal infarct , age undetermined Abnormal ECG Confirmed by Tilden Fossa [phone]) on 02/21/2023 12:00:46 AM  Radiology CT Angio Chest PE W/Cm &/Or Wo Cm  Result Date: 02/21/2023 CLINICAL DATA:  Pulmonary embolus suspected with low to intermediate probability. Positive D-dimer. EXAM: CT ANGIOGRAPHY CHEST WITH CONTRAST TECHNIQUE: Multidetector CT imaging of the chest was performed using the standard protocol during bolus administration of intravenous contrast. Multiplanar CT image reconstructions and MIPs were obtained to evaluate the vascular anatomy. RADIATION DOSE REDUCTION: This exam was performed according to the departmental dose-optimization program which includes automated exposure control, adjustment of the mA and/or kV according to patient size and/or use of iterative reconstruction technique. CONTRAST:  60mL OMNIPAQUE IOHEXOL 350 MG/ML SOLN COMPARISON:  02/28/2022  FINDINGS: Cardiovascular: Evaluation of the lower lobe pulmonary arteries is limited due to motion artifact. The visualized central and proximal segmental pulmonary arteries appear patent without evidence of significant pulmonary embolus. Normal heart size. No pericardial effusions. Normal caliber thoracic aorta. Calcification of the aorta and coronary arteries. Mediastinum/Nodes: Small esophageal hiatal hernia. Esophagus is decompressed. Mediastinal lymph nodes are not pathologically enlarged, likely reactive. No change since prior study. Thyroid gland is unremarkable. Lungs/Pleura: Patchy airspace infiltrates throughout both lungs likely representing multifocal pneumonia. No pleural effusions. No pneumothorax. Mucous plugging demonstrated in the lower lobe bronchi. Upper Abdomen: No acute abnormalities. Musculoskeletal: No chest wall abnormality. No acute or significant osseous findings. Review of the MIP images confirms the above findings. IMPRESSION: 1. No evidence of significant pulmonary embolus. Artifact in the lower lobe pulmonary arteries due to motion. 2. Patchy airspace infiltrates throughout both lungs likely representing multifocal pneumonia. No pleural effusions. Electronically Signed   By: Burman Nieves M.D.   On: 02/21/2023 03:49   DG Chest 2 View  Result Date: 02/20/2023 CLINICAL DATA:  Shortness of breath, COPD, and CHF. Congested, nonproductive cough. EXAM: CHEST - 2 VIEW COMPARISON:  01/02/2023. FINDINGS: The heart is enlarged and mediastinal contour are within normal limits. Atherosclerotic calcification of the aorta is noted. The pulmonary vasculature is mildly distended. Increased AP diameter of the chest is noted with flattening of the diaphragms, compatible with COPD. Interstitial prominence is present bilaterally. There is mild patchy airspace disease at the lung bases. No effusion or pneumothorax. No acute osseous abnormality. IMPRESSION: 1. Cardiomegaly with distended pulmonary  vasculature. 2. Interstitial prominence bilaterally with mild airspace disease at the lung bases, possible edema or infiltrate. 3. Chronic obstructive pulmonary disease. Electronically Signed   By: Thornell Sartorius M.D.   On: 02/20/2023 23:14    Procedures Procedures    Medications Ordered in ED Medications  rivaroxaban (XARELTO) tablet 20 mg (has no administration in time range)  pantoprazole (PROTONIX) EC tablet 40 mg (has no administration in time range)  atorvastatin (  LIPITOR) tablet 80 mg (has no administration in time range)  mometasone-formoterol (DULERA) 200-5 MCG/ACT inhaler 2 puff (has no administration in time range)  ferrous sulfate tablet 325 mg (has no administration in time range)  insulin aspart (novoLOG) injection 0-9 Units (has no administration in time range)  insulin aspart (novoLOG) injection 0-5 Units (has no administration in time range)  cefTRIAXone (ROCEPHIN) 2 g in sodium chloride 0.9 % 100 mL IVPB (has no administration in time range)  azithromycin (ZITHROMAX) 500 mg in sodium chloride 0.9 % 250 mL IVPB (has no administration in time range)  methylPREDNISolone sodium succinate (SOLU-MEDROL) 40 mg/mL injection 40 mg (has no administration in time range)  acetaminophen (TYLENOL) tablet 650 mg (has no administration in time range)  prochlorperazine (COMPAZINE) injection 5 mg (has no administration in time range)  polyethylene glycol (MIRALAX / GLYCOLAX) packet 17 g (has no administration in time range)  melatonin tablet 5 mg (has no administration in time range)  methylPREDNISolone sodium succinate (SOLU-MEDROL) 125 mg/2 mL injection 125 mg (125 mg Intravenous Given 02/21/23 0109)  ipratropium (ATROVENT) nebulizer solution 0.5 mg (0.5 mg Nebulization Given 02/21/23 0052)  iohexol (OMNIPAQUE) 350 MG/ML injection 60 mL (60 mLs Intravenous Contrast Given 02/21/23 0221)    ED Course/ Medical Decision Making/ A&P                                 Medical Decision  Making Amount and/or Complexity of Data Reviewed Labs: ordered. Radiology: ordered.  Risk Prescription drug management. Decision regarding hospitalization.   Patient with history of COPD, hypertension, diabetes, atrial flutter here for evaluation of difficulty breathing for the last several days.  She does have asymmetric lower extremity edema.  She also has a new oxygen requirement.  She has occasional wheezing on examination without respiratory distress.  She was treated with Atrovent given her albuterol allergy as well as steroids.  BNP is improved when compared to priors.  A D-dimer was obtained, which was elevated.  A CTA was obtained, which is concerning for pneumonia.  No evidence of PE.  She was started on Augmentin.  Given her new oxygen requirement plan to admit for ongoing workup.  Patient updated of findings of studies and recommendation for admission and she is in agreement with treatment plan.  Hospitalist consulted for admission for ongoing care.        Final Clinical Impression(s) / ED Diagnoses Final diagnoses:  None    Rx / DC Orders ED Discharge Orders     None         Tilden Fossa, MD 02/21/23 [phone]

## 2023-02-21 NOTE — ED Notes (Signed)
Kaitlin TO INPATIENT HANDOFF REPORT  Kaitlin Nurse Name and Phone #: Percival Spanish [phone]  S Name/Age/Gender Kaitlin Cantrell 67 y.o. female Room/Bed: 003C/003C  Code Status   Code Status: Full Code  Home/SNF/Other Home Patient oriented to: self, place, time, and situation Is this baseline? Yes   Triage Complete: Triage complete  Chief Complaint Acute hypoxic respiratory failure (HCC) [J96.01]  Triage Note Pt arrived POV for SOB few days, pt has COPD and CHF, pt with congested non-productive cough. Faint inspiratory wheezing noted. Pt 84% on RA, placed on 2 2L [place], sats increased to 95%, pt does not wear home 02. Pt denies CP, no n/d. Does have hx of chronic back pain, not new tonight. A&O x4, otherwise VSS.    Allergies Allergies  Allergen Reactions   Albuterol Anaphylaxis    Throat/lips swelling   Morphine And Codeine Other (See Comments)    Very aggressive and doesn't help pain    Level of Care/Admitting Diagnosis Kaitlin Disposition     Kaitlin Disposition  Admit   Condition  --   Comment  Hospital Area: [facility] [[id number]]  Level of Care: Telemetry Medical [104]  May admit patient to [facility] or [facility] if equivalent level of care is available:: Yes  Covid Evaluation: Asymptomatic - no recent exposure (last 10 days) testing not required  Diagnosis: Acute hypoxic respiratory failure [facility]) [[id number]]  Admitting Physician: Darlin Drop [[id number]]  Attending Physician: Darlin Drop [[id number]]  Certification:: I certify this patient will need inpatient services for at least 2 midnights  Expected Medical Readiness: 02/23/2023          B Medical/Surgery History Past Medical History:  Diagnosis Date   Allergy    seasonal   Arthritis    Diabetes mellitus    GERD (gastroesophageal reflux disease)    History of pneumonia    15 years ago   Hyperlipidemia    Hypertension    PE (pulmonary embolism) 12/2015   Past Surgical History:  Procedure Laterality  Date   ANKLE FRACTURE SURGERY  1974   left and pinned then pins removed   ANKLE GANGLION CYST EXCISION  1994   right   BACK SURGERY  1998   discectomy and then fusion   CARDIAC CATHETERIZATION N/A 04/05/2016   Procedure: Right/Left Heart Cath and Coronary Angiography;  Surgeon: Laurey Morale, MD;  Location: [facility] INVASIVE CV LAB;  Service: Cardiovascular;  Laterality: N/A;   CARDIOVERSION N/A 01/02/2020   Procedure: CARDIOVERSION;  Surgeon: Laurey Morale, MD;  Location: [facility] ENDOSCOPY;  Service: Cardiovascular;  Laterality: N/A;   KNEE ARTHROPLASTY  02/14/2012   Procedure: COMPUTER ASSISTED TOTAL KNEE ARTHROPLASTY;  Surgeon: Cammy Copa, MD;  Location: [facility] OR;  Service: Orthopedics;  Laterality: Left;  Left total knee arthroplasty   TEE WITHOUT CARDIOVERSION N/A 01/02/2020   Procedure: TRANSESOPHAGEAL ECHOCARDIOGRAM (TEE);  Surgeon: Laurey Morale, MD;  Location: [facility] ENDOSCOPY;  Service: Cardiovascular;  Laterality: N/A;   TONSILLECTOMY  1972   TUBAL LIGATION  1996   UMBILICAL HERNIA REPAIR  1996     A IV Location/Drains/Wounds Patient Lines/Drains/Airways Status     Active Line/Drains/Airways     Name Placement date Placement time Site Days   Peripheral IV 02/21/23 20 G 1" Left Forearm 02/21/23  0104  Forearm  less than 1            Intake/Output Last 24 hours  Intake/Output Summary (Last 24 hours) at 02/21/2023 0753 Last data  filed at 02/21/2023 0601 Gross per 24 hour  Intake 350 ml  Output --  Net 350 ml    Labs/Imaging Results for orders placed or performed during the hospital encounter of 02/20/23 (from the past 48 hour(s))  Basic metabolic panel     Status: Abnormal   Collection Time: 02/20/23  8:14 PM  Result Value Ref Range   Sodium 134 (L) 135 - 145 mmol/L   Potassium 3.8 3.5 - 5.1 mmol/L   Chloride 97 (L) 98 - 111 mmol/L   CO2 26 22 - 32 mmol/L   Glucose, Bld 214 (H) 70 - 99 mg/dL    Comment: Glucose reference range applies only to samples taken  after fasting for at least 8 hours.   BUN 20 8 - 23 mg/dL   Creatinine, Ser 1.61 (H) 0.44 - 1.00 mg/dL   Calcium 8.7 (L) 8.9 - 10.3 mg/dL   GFR, Estimated 39 (L) >60 mL/min    Comment: (NOTE) Calculated using the CKD-EPI Creatinine Equation (2021)    Anion gap 11 5 - 15    Comment: Performed at [facility], [address], [place], [place] [address]  CBC     Status: Abnormal   Collection Time: 02/20/23  8:14 PM  Result Value Ref Range   WBC 10.5 4.0 - 10.5 K/uL   RBC 3.76 (L) 3.87 - 5.11 MIL/uL   Hemoglobin 9.2 (L) 12.0 - 15.0 g/dL   HCT 54.0 (L) 98.1 - 19.1 %   MCV 84.6 80.0 - 100.0 fL   MCH 24.5 (L) 26.0 - 34.0 pg   MCHC 28.9 (L) 30.0 - 36.0 g/dL   RDW 47.8 (H) 29.5 - 62.1 %   Platelets 339 150 - 400 K/uL   nRBC 0.0 0.0 - 0.2 %    Comment: Performed at [facility], [address], [place], [place] [address]  Troponin I (High Sensitivity)     Status: None   Collection Time: 02/20/23  8:14 PM  Result Value Ref Range   Troponin I (High Sensitivity) 17 <18 ng/L    Comment: (NOTE) Elevated high sensitivity troponin I (hsTnI) values and significant  changes across serial measurements may suggest ACS but many other  chronic and acute conditions are known to elevate hsTnI results.  Refer to the "Links" section for chest pain algorithms and additional  guidance. Performed at [facility], [address], [place], [place] [address]   Troponin I (High Sensitivity)     Status: None   Collection Time: 02/20/23 10:16 PM  Result Value Ref Range   Troponin I (High Sensitivity) 16 <18 ng/L    Comment: (NOTE) Elevated high sensitivity troponin I (hsTnI) values and significant  changes across serial measurements may suggest ACS but many other  chronic and acute conditions are known to elevate hsTnI results.  Refer to the "Links" section for chest pain algorithms and additional  guidance. Performed at [facility], [address], [place], [place] [address]    Brain natriuretic peptide     Status: Abnormal   Collection Time: 02/21/23  1:09 AM  Result Value Ref Range   B Natriuretic Peptide 179.8 (H) 0.0 - 100.0 pg/mL    Comment: Performed at [facility], [address], [place], [place] [address]  D-dimer, quantitative     Status: Abnormal   Collection Time: 02/21/23  1:09 AM  Result Value Ref Range   D-Dimer, Quant 1.54 (H) 0.00 - 0.50  ug/mL-FEU    Comment: (NOTE) At the manufacturer cut-off value of 0.5 g/mL FEU, this assay has a negative predictive value of 95-100%.This assay is intended for use in conjunction with a clinical pretest probability (PTP) assessment model to exclude pulmonary embolism (PE) and deep venous thrombosis (DVT) in outpatients suspected of PE or DVT. Results should be correlated with clinical presentation. Performed at [facility], [address], [place], [place] [address]    CT Angio Chest PE W/Cm &/Or Wo Cm  Result Date: 02/21/2023 CLINICAL DATA:  Pulmonary embolus suspected with low to intermediate probability. Positive D-dimer. EXAM: CT ANGIOGRAPHY CHEST WITH CONTRAST TECHNIQUE: Multidetector CT imaging of the chest was performed using the standard protocol during bolus administration of intravenous contrast. Multiplanar CT image reconstructions and MIPs were obtained to evaluate the vascular anatomy. RADIATION DOSE REDUCTION: This exam was performed according to the departmental dose-optimization program which includes automated exposure control, adjustment of the mA and/or kV according to patient size and/or use of iterative reconstruction technique. CONTRAST:  60mL OMNIPAQUE IOHEXOL 350 MG/ML SOLN COMPARISON:  02/28/2022 FINDINGS: Cardiovascular: Evaluation of the lower lobe pulmonary arteries is limited due to motion artifact. The visualized central and proximal segmental pulmonary arteries appear patent without evidence of significant pulmonary embolus. Normal heart size. No pericardial effusions.  Normal caliber thoracic aorta. Calcification of the aorta and coronary arteries. Mediastinum/Nodes: Small esophageal hiatal hernia. Esophagus is decompressed. Mediastinal lymph nodes are not pathologically enlarged, likely reactive. No change since prior study. Thyroid gland is unremarkable. Lungs/Pleura: Patchy airspace infiltrates throughout both lungs likely representing multifocal pneumonia. No pleural effusions. No pneumothorax. Mucous plugging demonstrated in the lower lobe bronchi. Upper Abdomen: No acute abnormalities. Musculoskeletal: No chest wall abnormality. No acute or significant osseous findings. Review of the MIP images confirms the above findings. IMPRESSION: 1. No evidence of significant pulmonary embolus. Artifact in the lower lobe pulmonary arteries due to motion. 2. Patchy airspace infiltrates throughout both lungs likely representing multifocal pneumonia. No pleural effusions. Electronically Signed   By: Burman Nieves M.D.   On: 02/21/2023 03:49   DG Chest 2 View  Result Date: 02/20/2023 CLINICAL DATA:  Shortness of breath, COPD, and CHF. Congested, nonproductive cough. EXAM: CHEST - 2 VIEW COMPARISON:  01/02/2023. FINDINGS: The heart is enlarged and mediastinal contour are within normal limits. Atherosclerotic calcification of the aorta is noted. The pulmonary vasculature is mildly distended. Increased AP diameter of the chest is noted with flattening of the diaphragms, compatible with COPD. Interstitial prominence is present bilaterally. There is mild patchy airspace disease at the lung bases. No effusion or pneumothorax. No acute osseous abnormality. IMPRESSION: 1. Cardiomegaly with distended pulmonary vasculature. 2. Interstitial prominence bilaterally with mild airspace disease at the lung bases, possible edema or infiltrate. 3. Chronic obstructive pulmonary disease. Electronically Signed   By: Thornell Sartorius M.D.   On: 02/20/2023 23:14    Pending Labs Unresulted Labs (From  admission, onward)    None       Vitals/Pain Today's Vitals   02/21/23 0445 02/21/23 0500 02/21/23 0515 02/21/23 0530  BP: (!) 144/102 (!) 117/100 (!) 152/69 (!) 141/85  Pulse: 99 97 97 96  Resp: 20 20 (!) 24 (!) 30  Temp:      TempSrc:      SpO2: 94% 91% 94% 92%  Weight:      Height:      PainSc:        Isolation Precautions No active isolations  Medications Medications  rivaroxaban (  XARELTO) tablet 20 mg (has no administration in time range)  pantoprazole (PROTONIX) EC tablet 40 mg (has no administration in time range)  atorvastatin (LIPITOR) tablet 80 mg (has no administration in time range)  mometasone-formoterol (DULERA) 200-5 MCG/ACT inhaler 2 puff (has no administration in time range)  ferrous sulfate tablet 325 mg (has no administration in time range)  insulin aspart (novoLOG) injection 0-9 Units (has no administration in time range)  insulin aspart (novoLOG) injection 0-5 Units (has no administration in time range)  cefTRIAXone (ROCEPHIN) 2 g in sodium chloride 0.9 % 100 mL IVPB (0 g Intravenous Stopped 02/21/23 0455)  azithromycin (ZITHROMAX) 500 mg in sodium chloride 0.9 % 250 mL IVPB (0 mg Intravenous Stopped 02/21/23 0601)  methylPREDNISolone sodium succinate (SOLU-MEDROL) 40 mg/mL injection 40 mg (has no administration in time range)  acetaminophen (TYLENOL) tablet 650 mg (has no administration in time range)  prochlorperazine (COMPAZINE) injection 5 mg (has no administration in time range)  polyethylene glycol (MIRALAX / GLYCOLAX) packet 17 g (has no administration in time range)  melatonin tablet 5 mg (has no administration in time range)  guaiFENesin-dextromethorphan (ROBITUSSIN DM) 100-10 MG/5ML syrup 5 mL (has no administration in time range)  metoprolol succinate (TOPROL-XL) 24 hr tablet 12.5 mg (has no administration in time range)  furosemide (LASIX) tablet 40 mg (has no administration in time range)  gabapentin (NEURONTIN) capsule 200 mg (has no  administration in time range)  magnesium oxide (MAG-OX) tablet 400 mg (has no administration in time range)  methylPREDNISolone sodium succinate (SOLU-MEDROL) 125 mg/2 mL injection 125 mg (125 mg Intravenous Given 02/21/23 0109)  ipratropium (ATROVENT) nebulizer solution 0.5 mg (0.5 mg Nebulization Given 02/21/23 0052)  iohexol (OMNIPAQUE) 350 MG/ML injection 60 mL (60 mLs Intravenous Contrast Given 02/21/23 0221)    Mobility walks     Focused Assessments Pulmonary Assessment Handoff:  Lung sounds: Bilateral Breath Sounds: Expiratory wheezes L Breath Sounds: Inspiratory wheezes R Breath Sounds: Inspiratory wheezes O2 Device: Nasal Cannula O2 Flow Rate (L/min): 3 L/min    R Recommendations: See Admitting Provider Note  Report given to:   Additional Notes:

## 2023-02-21 NOTE — Inpatient Diabetes Management (Signed)
Inpatient Diabetes Program Recommendations  AACE/ADA: New Consensus Statement on Inpatient Glycemic Control (2015)  Target Ranges:  Prepandial:   less than 140 mg/dL      Peak postprandial:   less than 180 mg/dL (1-2 hours)      Critically ill patients:  140 - 180 mg/dL   Lab Results  Component Value Date   GLUCAP 326 (H) 02/21/2023   HGBA1C 7.7 12/13/2022    Review of Glycemic Control  Latest Reference Range & Units 02/21/23 08:02 02/21/23 08:47  Glucose-Capillary 70 - 99 mg/dL 161 (H) 096 (H)   Diabetes history: DM  Outpatient Diabetes medications:  Amaryl 4 mg bid Lantus 25 units q HS Metformin 850 mg bid Current orders for Inpatient glycemic control:  Novolog 0-9 units tid with meals and HS Solumedrol 40 mg IV daily  Inpatient Diabetes Program Recommendations:   Please cosider adding Semglee 25 units daily and Novolog 4 units tid with meals (hold if patient eats less than 50% and NPO).    Thanks,   Beryl Meager, RN, BC-ADM Inpatient Diabetes Coordinator Pager [phone]  (8a-5p)

## 2023-02-21 NOTE — Plan of Care (Signed)

## 2023-02-21 NOTE — Evaluation (Signed)
Physical Therapy Evaluation Patient Details Name: ANAISHA ROARK MRN: [id number] DOB: 21-Sep-1955 Today's Date: 02/21/2023  History of Present Illness  Patient is a 67 year old female with acute hypoxic respiratory failure, COPD exacerbation in the setting of multifocal pneumonia.  Clinical Impression  Patient is agreeable to PT evaluation. She reports she is independent at baseline without assistive device and no home oxygen.  The patient is Modified independent with bed mobility and transfers today. CGA provided for hallway ambulation with one bout of unsteadiness with staggering to the left with fatigue. One brief standing rest break required. Patient educated on energy conservation techniques to use in home setting. PT will follow while in the hospital to maximize independence. No PT needs anticipated at this time after this hospital stay.       If plan is discharge home, recommend the following: Assist for transportation;Help with stairs or ramp for entrance   Can travel by private vehicle        Equipment Recommendations None recommended by PT  Recommendations for Other Services       Functional Status Assessment Patient has had a recent decline in their functional status and demonstrates the ability to make significant improvements in function in a reasonable and predictable amount of time.     Precautions / Restrictions Precautions Precautions: Fall Restrictions Weight Bearing Restrictions: No      Mobility  Bed Mobility Overal bed mobility: Modified Independent             General bed mobility comments: increased time required with rest breaks needed between bouts of activity    Transfers Overall transfer level: Modified independent                 General transfer comment: increased time, no physical assistance needed    Ambulation/Gait Ambulation/Gait assistance: Contact guard assist Gait Distance (Feet): 120 Feet Assistive device: None Gait  Pattern/deviations: Step-through pattern, Decreased stride length, Staggering left Gait velocity: decreased     General Gait Details: one bout of staggering to the left with fatigue with CGA provided for safety. one short standing rest break with ambulation. patient is fatigued with activity  Stairs            Wheelchair Mobility     Tilt Bed    Modified Rankin (Stroke Patients Only)       Balance Overall balance assessment: Needs assistance Sitting-balance support: Feet supported Sitting balance-Leahy Scale: Good     Standing balance support: No upper extremity supported Standing balance-Leahy Scale: Fair                               Pertinent Vitals/Pain Pain Assessment Pain Assessment: No/denies pain    Home Living Family/patient expects to be discharged to:: Private residence Living Arrangements: Spouse/significant other Available Help at Discharge: Family;Available 24 hours/day Type of Home: House Home Access: Stairs to enter   [employer] of Steps: 2   Home Layout: One level Home Equipment: Agricultural consultant (2 wheels);Cane - single point;Electric scooter      Prior Function Prior Level of Function : Independent/Modified Independent             Mobility Comments: limited distance ambulation without oxygen and no DME required usually ADLs Comments: family cooks and spouse usually does the cleaning, laundry, etc.     Extremity/Trunk Assessment   Upper Extremity Assessment Upper Extremity Assessment: Overall WFL for tasks assessed  Lower Extremity Assessment Lower Extremity Assessment: Generalized weakness       Communication   Communication Communication: No apparent difficulties  Cognition Arousal: Alert Behavior During Therapy: WFL for tasks assessed/performed Overall Cognitive Status: Within Functional Limits for tasks assessed                                          General Comments  General comments (skin integrity, edema, etc.): educated patient on energy conservation stratgies to use in home setting    Exercises     Assessment/Plan    PT Assessment Patient needs continued PT services  PT Problem List Decreased activity tolerance;Decreased mobility;Decreased balance;Cardiopulmonary status limiting activity       PT Treatment Interventions DME instruction;Gait training;Stair training;Functional mobility training;Therapeutic exercise;Therapeutic activities;Balance training;Neuromuscular re-education;Cognitive remediation;Patient/family education    PT Goals (Current goals can be found in the Care Plan section)  Acute Rehab PT Goals Patient Stated Goal: to go home PT Goal Formulation: With patient Time For Goal Achievement: 02/28/23 Potential to Achieve Goals: Fair    Frequency Min 1X/week     Co-evaluation               AM-PAC PT "6 Clicks" Mobility  Outcome Measure Help needed turning from your back to your side while in a flat bed without using bedrails?: None Help needed moving from lying on your back to sitting on the side of a flat bed without using bedrails?: None Help needed moving to and from a bed to a chair (including a wheelchair)?: None Help needed standing up from a chair using your arms (e.g., wheelchair or bedside chair)?: None Help needed to walk in hospital room?: A Little Help needed climbing 3-5 steps with a railing? : A Lot 6 Click Score: 21    End of Session   Activity Tolerance: Patient tolerated treatment well Patient left: in bed;with call bell/phone within reach   PT Visit Diagnosis: Muscle weakness (generalized) (M62.81);Unsteadiness on feet (R26.81)    Time: 1045-1100 PT Time Calculation (min) (ACUTE ONLY): 15 min   Charges:   PT Evaluation $PT Eval Low Complexity: 1 Low   PT General Charges $$ ACUTE PT VISIT: 1 Visit         Shirleymae Bernard, PT, MPT   Ina Homes 02/21/2023, 1:01 PM

## 2023-02-21 NOTE — Progress Notes (Addendum)
PROGRESS NOTE  Kaitlin Cantrell  DOB: 05-16-1955  PCP: [name], MD WUJ:[id number]  DOA: 02/20/2023  LOS: 0 days  Hospital Day: 2  Brief narrative: Kaitlin Cantrell is a 67 y.o. female with PMH significant for DM2, HTN, HLD, chronic 1 pack/day smoker, COPD, CHF, paroxysmal A-fib on Xarelto, CKD, PE 10/21, patient presented to the ED from home with complaint of several days of progressive dyspnea, productive cough not relieved with OTC meds.  In the ED, patient was afebrile, heart rate in 90s, breathing 20s, blood pressure 140s, O2 sat 84% on room air requiring 2 to 3 L oxygen by nasal cannula Labs with WC count 10.5, hemoglobin 9.2, D-dimer 1.54, COVID and influenza AMB negative. CT angio chest negative for acute pulmonary embolism however showed patchy airspace infiltrates in both lungs suggestive of multifocal pneumonia.   EKG with normal sinus rhythm, QTc 459 ms  Patient was started on IV antibiotics, IV steroids Admitted to [facility]  Subjective: Patient was seen and examined this afternoon.  Pleasant elderly Caucasian female.  Sitting up at the edge of the bed.  Not in distress.  On 3 L Oxymizer well. Chart reviewed.  Assessment and plan: Multifocal pneumonia  Acute hypoxic respiratory failure  Acute exacerbation of COPD Current daily smoker Presented with shortness of breath, productive cough, hypoxia requiring oxygen  CT chest with multifocal pneumonia  Currently on IV Rocephin and azithromycin.  QTc 459 ms Also on IV Solu-Medrol 40 mg daily. Home bronchodilators, the patient has albuterol allergy. Continue antitussives as needed Continue flutter valve Early mobilization as tolerated. Wean down oxygen as tolerated Counseled to quit smoking.  Nicotine patch offered Recent Labs  Lab 02/20/23 2014  WBC 10.5    Hypertension H/o CHF Patient had h/o systolic CHF which she recovered from.  Most recent echo from February 2024 with EF 60 to 65% with no WMA PTA  meds-Toprol 200 mg daily, amlodipine 5 mg daily, Lasix 40 mg daily, Entresto 97/103 mg twice daily. Blood pressure elevated this afternoon.  Resume all. Continue monitor blood pressure IV hydralazine as needed  Paroxysmal A-fib Currently normal sinus rhythm.   Continue Toprol and Xarelto Continue monitoring telemetry  History of PE Continue Xarelto  Chronic anemia GERD Hemoglobin slightly low but stable.  No active bleeding.  On Xarelto. Continue PPI and iron supplement Recent Labs    01/06/23 0756 01/07/23 0431 01/08/23 0417 01/10/23 1138 02/07/23 1131 02/20/23 2014  HGB 10.6* 9.4* 10.6* 10.7*  --  9.2*  MCV 82.2 82.8 82.7 86  --  84.6  VITAMINB12  --   --   --   --  1,007  --   FOLATE  --   --   --   --  3.3  --   FERRITIN  --   --   --   --  27  --   TIBC  --   --   --   --  281  --   IRON  --   --   --   --  29  --    Type 2 diabetes mellitus Peripheral neuropathy A1c 7.7 on August 2024 PTA meds-Lantus 25 units daily, glimepride 4 mg twice daily, metformin 850 mg twice daily Currently on SSI/Accu-Cheks only.  Blood sugar rising up. Will probably go even higher with iv steroids. Resume Lantus at 25 units daily, resume glimepiride.  Keep metformin on hold. Continue Neurontin Recent Labs  Lab 02/21/23 0802 02/21/23 0847 02/21/23 1149  02/21/23 1619  GLUCAP 269* 326* 317* 344*   Hyperlipidemia Continue Lipitor   Mobility: Encourage ambulation.  Check ambulatory O2 sat  Goals of care   Code Status: Full Code     DVT prophylaxis:  rivaroxaban (XARELTO) tablet 20 mg Start: 02/21/23 1000 rivaroxaban (XARELTO) tablet 20 mg   Antimicrobials: IV Rocephin, azithromycin Fluid: None Consultants: None Family Communication: None at bedside  Status: Inpatient Level of care:  Telemetry Medical   Patient is from: Home.  Lives with husband Needs to continue in-hospital care: Remains on IV steroids, supplemental oxygen. Anticipated d/c to: Hopefully home in 1 to 2  days    Diet:  Diet Order             Diet heart healthy/carb modified Room service appropriate? Yes; Fluid consistency: Thin  Diet effective now                   Scheduled Meds:  amLODipine  5 mg Oral Daily   atorvastatin  80 mg Oral Daily   ferrous sulfate  325 mg Oral Daily   furosemide  40 mg Oral Daily   gabapentin  200 mg Oral BID   glimepiride  4 mg Oral BID WC   [START ON 02/22/2023] insulin aspart  0-15 Units Subcutaneous TID WC   insulin aspart  0-5 Units Subcutaneous QHS   insulin glargine-yfgn  25 Units Subcutaneous Daily   magnesium oxide  400 mg Oral QHS   [START ON 02/22/2023] methylPREDNISolone (SOLU-MEDROL) injection  40 mg Intravenous Daily   metoprolol  12.5 mg Oral Daily   mometasone-formoterol  2 puff Inhalation BID   pantoprazole  40 mg Oral Daily   rivaroxaban  20 mg Oral Daily   sacubitril-valsartan  1 tablet Oral BID    PRN meds: acetaminophen, guaiFENesin-dextromethorphan, melatonin, polyethylene glycol, prochlorperazine   Infusions:   azithromycin Stopped (02/21/23 0601)   cefTRIAXone (ROCEPHIN)  IV Stopped (02/21/23 0455)    Antimicrobials: Anti-infectives (From admission, onward)    Start     Dose/Rate Route Frequency Ordered Stop   02/21/23 0400  cefTRIAXone (ROCEPHIN) 2 g in sodium chloride 0.9 % 100 mL IVPB        2 g 200 mL/hr over 30 Minutes Intravenous Every 24 hours 02/21/23 0342     02/21/23 0400  azithromycin (ZITHROMAX) 500 mg in sodium chloride 0.9 % 250 mL IVPB        500 mg 250 mL/hr over 60 Minutes Intravenous Every 24 hours 02/21/23 0343     02/21/23 0330  amoxicillin-clavulanate (AUGMENTIN) 875-125 MG per tablet 1 tablet  Status:  Discontinued        1 tablet Oral  Once 02/21/23 0319 02/21/23 0342       Objective: Vitals:   02/21/23 1215 02/21/23 1620  BP: (!) 169/88 (!) 143/79  Pulse: 92 89  Resp:  18  Temp: (!) 97.5 F (36.4 C) 98 F (36.7 C)  SpO2: (!) 89% 97%    Intake/Output Summary (Last 24  hours) at 02/21/2023 1646 Last data filed at 02/21/2023 0601 Gross per 24 hour  Intake 350 ml  Output --  Net 350 ml   Filed Weights   02/20/23 2006  Weight: 117.9 kg   Weight change:  Body mass index is 41.97 kg/m.   Physical Exam: General exam: Pleasant, elderly.  Not in distress Skin: No rashes, lesions or ulcers. HEENT: Atraumatic, normocephalic, no obvious bleeding Lungs: Scattered bilateral bronchial sound heard.  Coughs on deep  breathing.  Wheezing controlled on steroids CVS: Regular rate and rhythm, no murmur GI/Abd: soft, nontender, nondistended, bowels are present CNS: Alert, awake, oriented x 3 Psychiatry: Mood appropriate Extremities: Mild chronic bilateral pedal edema L>R per patient, no calf tenderness  Data Review: I have personally reviewed the laboratory data and studies available.  F/u labs ordered Unresulted Labs (From admission, onward)     Start     Ordered   02/22/23 0500  CBC with Differential/Platelet  Tomorrow morning,   R        02/21/23 0848   02/22/23 0500  Basic metabolic panel  Tomorrow morning,   R        02/21/23 0848            Admission date and time: 02/20/2023  7:45 PM   Total time spent in review of labs and imaging, patient evaluation, formulation of plan, documentation and communication with family: 55 minutes  Signed, Lorin Glass, MD [facility] 02/21/2023

## 2023-02-21 NOTE — Progress Notes (Signed)
Patient transferred from the ED via bed at 0800 AM. On 3 L [place]. Alert and oriented.  Will continue to monitor

## 2023-02-22 DIAGNOSIS — J9601 Acute respiratory failure with hypoxia: Secondary | ICD-10-CM | POA: Diagnosis not present

## 2023-02-22 LAB — CBC WITH DIFFERENTIAL/PLATELET
Abs Immature Granulocytes: 0.13 10*3/uL — ABNORMAL HIGH (ref 0.00–0.07)
Basophils Absolute: 0 10*3/uL (ref 0.0–0.1)
Basophils Relative: 0 %
Eosinophils Absolute: 0 10*3/uL (ref 0.0–0.5)
Eosinophils Relative: 0 %
HCT: 32.5 % — ABNORMAL LOW (ref 36.0–46.0)
Hemoglobin: 9.4 g/dL — ABNORMAL LOW (ref 12.0–15.0)
Immature Granulocytes: 1 %
Lymphocytes Relative: 6 %
Lymphs Abs: 1 10*3/uL (ref 0.7–4.0)
MCH: 24.5 pg — ABNORMAL LOW (ref 26.0–34.0)
MCHC: 28.9 g/dL — ABNORMAL LOW (ref 30.0–36.0)
MCV: 84.6 fL (ref 80.0–100.0)
Monocytes Absolute: 1.3 10*3/uL — ABNORMAL HIGH (ref 0.1–1.0)
Monocytes Relative: 8 %
Neutro Abs: 13 10*3/uL — ABNORMAL HIGH (ref 1.7–7.7)
Neutrophils Relative %: 85 %
Platelets: 370 10*3/uL (ref 150–400)
RBC: 3.84 MIL/uL — ABNORMAL LOW (ref 3.87–5.11)
RDW: 16.3 % — ABNORMAL HIGH (ref 11.5–15.5)
WBC: 15.5 10*3/uL — ABNORMAL HIGH (ref 4.0–10.5)
nRBC: 0 % (ref 0.0–0.2)

## 2023-02-22 LAB — BASIC METABOLIC PANEL
Anion gap: 10 (ref 5–15)
Anion gap: 14 (ref 5–15)
BUN: 27 mg/dL — ABNORMAL HIGH (ref 8–23)
BUN: 27 mg/dL — ABNORMAL HIGH (ref 8–23)
CO2: 26 mmol/L (ref 22–32)
CO2: 26 mmol/L (ref 22–32)
Calcium: 9 mg/dL (ref 8.9–10.3)
Calcium: 9.4 mg/dL (ref 8.9–10.3)
Chloride: 96 mmol/L — ABNORMAL LOW (ref 98–111)
Chloride: 95 mmol/L — ABNORMAL LOW (ref 98–111)
Creatinine, Ser: 1.37 mg/dL — ABNORMAL HIGH (ref 0.44–1.00)
Creatinine, Ser: 1.47 mg/dL — ABNORMAL HIGH (ref 0.44–1.00)
GFR, Estimated: 43 mL/min — ABNORMAL LOW (ref >60)
GFR, Estimated: 39 mL/min — ABNORMAL LOW (ref >60)
Glucose, Bld: 374 mg/dL — ABNORMAL HIGH (ref 70–99)
Glucose, Bld: 290 mg/dL — ABNORMAL HIGH (ref 70–99)
Potassium: 5.7 mmol/L — ABNORMAL HIGH (ref 3.5–5.1)
Potassium: 4.6 mmol/L (ref 3.5–5.1)
Sodium: 132 mmol/L — ABNORMAL LOW (ref 135–145)
Sodium: 135 mmol/L (ref 135–145)

## 2023-02-22 LAB — GLUCOSE, CAPILLARY
Glucose-Capillary: 177 mg/dL — ABNORMAL HIGH (ref 70–99)
Glucose-Capillary: 260 mg/dL — ABNORMAL HIGH (ref 70–99)
Glucose-Capillary: 335 mg/dL — ABNORMAL HIGH (ref 70–99)
Glucose-Capillary: 359 mg/dL — ABNORMAL HIGH (ref 70–99)
Glucose-Capillary: 368 mg/dL — ABNORMAL HIGH (ref 70–99)
Glucose-Capillary: 377 mg/dL — ABNORMAL HIGH (ref 70–99)

## 2023-02-22 LAB — POTASSIUM: Potassium: 4.3 mmol/L (ref 3.5–5.1)

## 2023-02-22 LAB — MAGNESIUM: Magnesium: 1.9 mg/dL (ref 1.7–2.4)

## 2023-02-22 MED ORDER — PREDNISONE 20 MG PO TABS
20.0000 mg | ORAL_TABLET | Freq: Every day | ORAL | Status: DC
  Administered 2023-02-23 – 2023-02-24 (×2): 20 mg via ORAL
  Filled 2023-02-22 (×2): qty 1

## 2023-02-22 MED ORDER — PREDNISONE 20 MG PO TABS: 40.0000 mg | ORAL_TABLET | Freq: Every day | ORAL | Status: DC

## 2023-02-22 MED ORDER — FUROSEMIDE 40 MG PO TABS
40.0000 mg | ORAL_TABLET | Freq: Two times a day (BID) | ORAL | Status: DC
  Administered 2023-02-22 – 2023-02-23 (×2): 40 mg via ORAL
  Filled 2023-02-22 (×2): qty 1

## 2023-02-22 MED ORDER — LEVOFLOXACIN 500 MG PO TABS
500.0000 mg | ORAL_TABLET | Freq: Every day | ORAL | Status: AC
  Administered 2023-02-22 – 2023-02-24 (×3): 500 mg via ORAL
  Filled 2023-02-22 (×3): qty 1

## 2023-02-22 MED ORDER — METOPROLOL TARTRATE 12.5 MG HALF TABLET
12.5000 mg | ORAL_TABLET | Freq: Once | ORAL | Status: AC
  Administered 2023-02-22: 12.5 mg via ORAL
  Filled 2023-02-22: qty 1

## 2023-02-22 MED ORDER — LEVALBUTEROL HCL 0.63 MG/3ML IN NEBU: 0.6300 mg | INHALATION_SOLUTION | Freq: Four times a day (QID) | RESPIRATORY_TRACT | Status: DC | PRN

## 2023-02-22 NOTE — Progress Notes (Signed)
TED hose placed per order.

## 2023-02-22 NOTE — Inpatient Diabetes Management (Signed)
Inpatient Diabetes Program Recommendations  AACE/ADA: New Consensus Statement on Inpatient Glycemic Control (2015)  Target Ranges:  Prepandial:   less than 140 mg/dL      Peak postprandial:   less than 180 mg/dL (1-2 hours)      Critically ill patients:  140 - 180 mg/dL   Lab Results  Component Value Date   GLUCAP 335 (H) 02/22/2023   HGBA1C 7.7 12/13/2022    Review of Glycemic Control  Latest Reference Range & Units 02/21/23 16:19 02/21/23 21:19 02/22/23 00:06 02/22/23 04:56 02/22/23 07:16  Glucose-Capillary 70 - 99 mg/dL 657 (H) 846 (H) 962 (H) 368 (H) 335 (H)   Diabetes history: DM 2 Outpatient Diabetes medications:  Amaryl 4 mg bid Lantus 25 units q HS Metformin 850 mg bid Current orders for Inpatient glycemic control:  Novolog 0-15 units tid with meals and HS Solumedrol 40 mg IV daily Amaryl 4 mg bid Semglee 25 units daily  Inpatient Diabetes Program Recommendations:    Consider increasing Semglee to 35 units daily and add Novolog 4 units tid with meals (hold if patient eats less than 50% or NPO).    Thanks,  Beryl Meager, RN, BC-ADM Inpatient Diabetes Coordinator Pager [phone]  (8a-5p)

## 2023-02-22 NOTE — Progress Notes (Signed)
HOSPITALIST ROUNDING NOTE Kaitlin Cantrell:[id number]  DOB: 1955-06-23  DOA: 02/20/2023  PCP: [name], MD  02/22/2023,6:31 AM   LOS: 1 day      Code Status: Full From: Home  current Dispo: None    67 year old white female COPD continued tobacco 1 pack/day  HTN DM TY 2 atrial flutter/Xarelto CHADVASC >3 , HF recovered EF COVID infection 2021 PE 2017 CKD 3B Recent hospitalization 01/29/2023 sepsis secondary to COVID with unresponsiveness-was hypoglycemic at that time and improved and was discharged home  RTC 01/2244-day SOB green sputum + wheeze + oxygen requirement  [Albuterol allergy]? BUN/creatinine 20/1.4, potassium 3.8, BNP 179 troponin flat White count 10.5 hemoglobin 9.2 platelet 339 CXR = cardiomegaly distended pulmonary vasculature interstitial bilateral prominent airspace disease at lung bases possible edema    Plan Multifactorial dyspnea Recurrent pneumonia in the setting of recent COVID Possible component HFpEF/restrictive habitus Narrow azithromycin and ceftriaxone-->Levaquin to complete 5 days total antibiotics Not entirely convinced that this is only PNA?-Does not check weights at home-see below Outpatient evaluation [facility] Per primary physician She does not use oxygen at home so we will get a desat screen in the morning 10/24  Continued chronic smoker with COPD at baseline with albuterol allergy No wheeze-discontinue Solu-Medrol given hyperglycemia this morning-changed to prednisone 20 stop date added Allergy to albuterol so may use Xopenex  Submassive PE in 2017 Atrial flutter CHADVASC >3 on Xarelto Continues on Xarelto continue metoprolol XL 12.5 daily  HF recovered EF, hypertension-discontinue Entresto At baseline she says she is about to 220 to 230 pounds but does not weigh herself on a scale She tells me subjectively she feels "much less swollen" She has 2+ pitting edema and JVD Increase Lasix to 40 twice daily p.o.--- can continue usual  anti-HTN meds metoprolol as above amlodipine 5 I have held Entresto given CKD  DM TY 2 A1c 7.7 on 12/2022 with hyperglycemia this hospitalization from steroids Continues on Amaryl 4 mg twice daily meals, moderate sliding scale Would not increase Lantus to 35 units yet instead cut back steroids and evaluate again in a.m. Not a good candidate to resume metformin given risk of azotemia  CKD 3  Hyperkalemia noted this morning is not confirmed-I think the sample was hemolyzed We will repeat labs in the morning Entresto held-May resume as outpatient  DVT prophylaxis: Xarelto  Status is: Inpatient Remains inpatient appropriate because: Requires more diuresis      Subjective: Still short winded does not use oxygen at home no fever no chills-has been coughing some this morning feels overall better Lower extremities is swollen--No chest pain  Objective + exam Vitals:   02/21/23 1215 02/21/23 1620 02/21/23 2016 02/22/23 0501  BP: (!) 169/88 (!) 143/79 (!) 148/77 (!) 145/86  Pulse: 92 89 89 99  Resp:  18 18 18   Temp: (!) 97.5 F (36.4 C) 98 F (36.7 C) 97.8 F (36.6 C) 97.7 F (36.5 C)  TempSrc: Oral     SpO2: (!) 89% 97% 97% 98%  Weight:      Height:       Filed Weights   02/20/23 2006  Weight: 117.9 kg    Examination:  EOMI NCAT JVD noted Neck soft supple Chest is clear with no wheeze no rales ROM is intact moving all 4 limbs abdomen is soft no rebound Grade 2-3 lower extremity edema Neuro intact moving 4 limbs equally Power 5/5  Data Reviewed: reviewed   CBC    Component Value Date/Time  WBC 15.5 (H) 02/22/2023 0456   RBC 3.84 (L) 02/22/2023 0456   HGB 9.4 (L) 02/22/2023 0456   HGB 10.7 (L) 01/10/2023 1138   HCT 32.5 (L) 02/22/2023 0456   HCT 35.8 01/10/2023 1138   PLT 370 02/22/2023 0456   PLT 362 01/10/2023 1138   MCV 84.6 02/22/2023 0456   MCV 86 01/10/2023 1138   MCH 24.5 (L) 02/22/2023 0456   MCHC 28.9 (L) 02/22/2023 0456   RDW 16.3 (H) 02/22/2023  0456   RDW 14.2 01/10/2023 1138   LYMPHSABS 1.0 02/22/2023 0456   LYMPHSABS 1.5 01/10/2023 1138   MONOABS 1.3 (H) 02/22/2023 0456   EOSABS 0.0 02/22/2023 0456   EOSABS 0.3 01/10/2023 1138   BASOSABS 0.0 02/22/2023 0456   BASOSABS 0.0 01/10/2023 1138      Latest Ref Rng & Units 02/22/2023    4:56 AM 02/20/2023    8:14 PM 01/10/2023   11:38 AM  CMP  Glucose 70 - 99 mg/dL 657  846  962   BUN 8 - 23 mg/dL 27  20  32   Creatinine 0.44 - 1.00 mg/dL 9.52  8.41  3.24   Sodium 135 - 145 mmol/L 132  134  140   Potassium 3.5 - 5.1 mmol/L 5.7  3.8  4.7   Chloride 98 - 111 mmol/L 96  97  99   CO2 22 - 32 mmol/L 26  26  26    Calcium 8.9 - 10.3 mg/dL 9.0  8.7  9.2   Total Protein 6.0 - 8.5 g/dL   5.9   Total Bilirubin 0.0 - 1.2 mg/dL   0.3   Alkaline Phos 44 - 121 IU/L   111   AST 0 - 40 IU/L   13   ALT 0 - 32 IU/L   10      Scheduled Meds:  amLODipine  5 mg Oral Daily   atorvastatin  80 mg Oral Daily   ferrous sulfate  325 mg Oral Daily   furosemide  40 mg Oral BID   gabapentin  200 mg Oral BID   glimepiride  4 mg Oral BID WC   insulin aspart  0-15 Units Subcutaneous TID WC   insulin aspart  0-5 Units Subcutaneous QHS   insulin glargine-yfgn  25 Units Subcutaneous Daily   magnesium oxide  400 mg Oral QHS   metoprolol  12.5 mg Oral Daily   mometasone-formoterol  2 puff Inhalation BID   pantoprazole  40 mg Oral Daily   [START ON 02/23/2023] predniSONE  20 mg Oral QAC breakfast   rivaroxaban  20 mg Oral Daily   Continuous Infusions:  azithromycin 500 mg (02/22/23 0401)   cefTRIAXone (ROCEPHIN)  IV 2 g (02/22/23 0330)    Time 45  Rhetta Mura, MD  [facility]

## 2023-02-22 NOTE — Progress Notes (Signed)
Mobility Specialist Progress Note:    02/22/23 0849  Mobility  Activity Ambulated with assistance in hallway  Level of Assistance Modified independent, requires aide device or extra time  Assistive Device None;Other (Comment) (Hand rails)  Distance Ambulated (ft) 120 ft  Activity Response Tolerated well  Mobility Referral Yes  $Mobility charge 1 Mobility  Mobility Specialist Start Time (ACUTE ONLY) 0849  Mobility Specialist Stop Time (ACUTE ONLY) 0900  Mobility Specialist Time Calculation (min) (ACUTE ONLY) 11 min   Pt received in bed, agreeable to mobility session. Ambulated with ModI using hand rails and CGA with gait belt for safety. SpO2 96% on 4L throughout session. Tolerated well, took one standing rest break d/t SOB. VSS throughout. Returned pt to room, sitting up in chair, all needs met, call bell in reach.    Feliciana Rossetti Mobility Specialist Please contact via Special educational needs teacher or  Rehab office at [phone]

## 2023-02-22 NOTE — Evaluation (Signed)
Occupational Therapy Evaluation Patient Details Name: Kaitlin Cantrell MRN: [id number] DOB: 09-Feb-1956 Today's Date: 02/22/2023   History of Present Illness Patient is a 67 year old female with acute hypoxic respiratory failure, COPD exacerbation in the setting of multifocal pneumonia.   Clinical Impression   Kaitlin Cantrell reported they are feeling ok following having mobility tech and agreed for Occupational Therapy. Kaitlin Cantrell was educated on AE for LB ADLS as they are able to complete with difficulty due to back pain and SOB. Kaitlin Cantrell also was educated about compensations on completion ADLS at home as Kaitlin Cantrell noted when washing her face started to fatigue and required a seated rest break and destated to 87% while on 4L of o2 via [place] but was able to bring back to 94%. Kaitlin Cantrell then completed further hygiene and hair care while sitting with increase in time to pace self.       If plan is discharge home, recommend the following: A little help with bathing/dressing/bathroom;Assistance with cooking/housework;Assist for transportation;Help with stairs or ramp for entrance    Functional Status Assessment  Patient has had a recent decline in their functional status and demonstrates the ability to make significant improvements in function in a reasonable and predictable amount of time.  Equipment Recommendations  None recommended by OT    Recommendations for Other Services       Precautions / Restrictions Precautions Precautions: Fall Precaution Comments: watch o2 Restrictions Weight Bearing Restrictions: No      Mobility Bed Mobility Overal bed mobility:  (Kaitlin Cantrell presented in chair)                  Transfers Overall transfer level: Modified independent Equipment used: None               General transfer comment: hand rails as needed      Balance Overall balance assessment: Needs assistance Sitting-balance support: Feet supported Sitting balance-Leahy Scale: Good     Standing balance  support: No upper extremity supported Standing balance-Leahy Scale: Fair Standing balance comment: Kaitlin Cantrell limited due to back pain                           ADL either performed or assessed with clinical judgement   ADL Overall ADL's : Needs assistance/impaired Eating/Feeding: Independent;Sitting   Grooming: Wash/dry hands;Wash/dry face;Supervision/safety;Sitting;Cueing for safety;Cueing for sequencing   Upper Body Bathing: Set up;Sitting   Lower Body Bathing: Contact guard assist;Cueing for safety;Cueing for sequencing;Sit to/from stand   Upper Body Dressing : Set up;Sitting   Lower Body Dressing: Contact guard assist;Sit to/from stand   Toilet Transfer: Contact guard assist;Cueing for safety;Cueing for sequencing;Rolling walker (2 wheels)   Toileting- Clothing Manipulation and Hygiene: Contact guard assist;Cueing for safety;Cueing for sequencing;Sit to/from stand   Tub/ Shower Transfer: Contact guard assist;Cueing for safety;Cueing for sequencing;Rolling walker (2 wheels)   Functional mobility during ADLs: Contact guard assist General ADL Comments: hand held assist as needed     Vision         Perception         Praxis         Pertinent Vitals/Pain Pain Assessment Pain Assessment: No/denies pain     Extremity/Trunk Assessment Upper Extremity Assessment Upper Extremity Assessment: Overall WFL for tasks assessed   Lower Extremity Assessment Lower Extremity Assessment: Defer to Kaitlin Cantrell evaluation   Cervical / Trunk Assessment Cervical / Trunk Assessment: Kyphotic   Communication Communication Communication: No apparent difficulties  Cognition Arousal: Alert Behavior During Therapy: WFL for tasks assessed/performed Overall Cognitive Status: Within Functional Limits for tasks assessed                                       General Comments       Exercises     Shoulder Instructions      Home Living Family/patient expects to be  discharged to:: Private residence Living Arrangements: Spouse/significant other Available Help at Discharge: Family;Available 24 hours/day Type of Home: House Home Access: Stairs to enter [employer] of Steps: 2 Entrance Stairs-Rails: Right Home Layout: One level     Bathroom Shower/Tub: Tub/shower unit         Home Equipment: Agricultural consultant (2 wheels);Cane - single point;Electric scooter;Shower seat          Prior Functioning/Environment Prior Level of Function : Independent/Modified Independent             Mobility Comments: Kaitlin Cantrell reports as they have not felt good recently they have not been out of the house much and uses husband to balance with ADLs Comments: family cooks and spouse usually does the cleaning, laundry, etc.        OT Problem List: Decreased strength;Decreased activity tolerance;Impaired balance (sitting and/or standing);Decreased safety awareness;Cardiopulmonary status limiting activity      OT Treatment/Interventions: Self-care/ADL training;Energy conservation;DME and/or AE instruction;Therapeutic activities;Patient/family education;Balance training    OT Goals(Current goals can be found in the care plan section) Acute Rehab OT Goals Patient Stated Goal: to get stronger OT Goal Formulation: With patient Time For Goal Achievement: 03/07/23 Potential to Achieve Goals: Good  OT Frequency: Min 1X/week    Co-evaluation              AM-PAC OT "6 Clicks" Daily Activity     Outcome Measure Help from another person eating meals?: None Help from another person taking care of personal grooming?: A Little Help from another person toileting, which includes using toliet, bedpan, or urinal?: A Little Help from another person bathing (including washing, rinsing, drying)?: A Little Help from another person to put on and taking off regular upper body clothing?: None Help from another person to put on and taking off regular lower body clothing?: A  Little 6 Click Score: 20   End of Session Equipment Utilized During Treatment: Gait belt;Rolling walker (2 wheels) Nurse Communication: Mobility status  Activity Tolerance: Patient tolerated treatment well Patient left: in chair;with call bell/phone within reach  OT Visit Diagnosis: Unsteadiness on feet (R26.81);Other abnormalities of gait and mobility (R26.89);Muscle weakness (generalized) (M62.81)                Time: 1610-9604 OT Time Calculation (min): 37 min Charges:  OT General Charges $OT Visit: 1 Visit OT Evaluation $OT Eval Low Complexity: 1 Low OT Treatments $Self Care/Home Management : 8-22 mins  Presley Raddle OTR/L  Acute Rehab Services  [phone] office number   Alphia Moh 02/22/2023, 12:31 PM

## 2023-02-22 NOTE — Plan of Care (Signed)

## 2023-02-23 ENCOUNTER — Inpatient Hospital Stay (HOSPITAL_COMMUNITY): Payer: PPO

## 2023-02-23 DIAGNOSIS — J9601 Acute respiratory failure with hypoxia: Secondary | ICD-10-CM | POA: Diagnosis not present

## 2023-02-23 LAB — GLUCOSE, CAPILLARY
Glucose-Capillary: 261 mg/dL — ABNORMAL HIGH (ref 70–99)
Glucose-Capillary: 241 mg/dL — ABNORMAL HIGH (ref 70–99)
Glucose-Capillary: 199 mg/dL — ABNORMAL HIGH (ref 70–99)
Glucose-Capillary: 200 mg/dL — ABNORMAL HIGH (ref 70–99)

## 2023-02-23 LAB — BASIC METABOLIC PANEL
Anion gap: 9 (ref 5–15)
BUN: 28 mg/dL — ABNORMAL HIGH (ref 8–23)
CO2: 29 mmol/L (ref 22–32)
Calcium: 9 mg/dL (ref 8.9–10.3)
Chloride: 96 mmol/L — ABNORMAL LOW (ref 98–111)
Creatinine, Ser: 1.3 mg/dL — ABNORMAL HIGH (ref 0.44–1.00)
GFR, Estimated: 46 mL/min — ABNORMAL LOW (ref >60)
Glucose, Bld: 280 mg/dL — ABNORMAL HIGH (ref 70–99)
Potassium: 4.8 mmol/L (ref 3.5–5.1)
Sodium: 134 mmol/L — ABNORMAL LOW (ref 135–145)

## 2023-02-23 MED ORDER — INSULIN GLARGINE-YFGN 100 UNIT/ML ~~LOC~~ SOLN
15.0000 [IU] | Freq: Two times a day (BID) | SUBCUTANEOUS | Status: DC
  Administered 2023-02-24 – 2023-02-28 (×9): 15 [IU] via SUBCUTANEOUS
  Filled 2023-02-23 (×11): qty 0.15

## 2023-02-23 MED ORDER — INSULIN GLARGINE-YFGN 100 UNIT/ML ~~LOC~~ SOLN
10.0000 [IU] | Freq: Every day | SUBCUTANEOUS | Status: DC
  Filled 2023-02-23: qty 0.1

## 2023-02-23 MED ORDER — FUROSEMIDE 10 MG/ML IJ SOLN
40.0000 mg | Freq: Two times a day (BID) | INTRAMUSCULAR | Status: DC
  Administered 2023-02-23 – 2023-02-24 (×2): 40 mg via INTRAVENOUS
  Filled 2023-02-23 (×2): qty 4

## 2023-02-23 NOTE — Progress Notes (Signed)
Occupational Therapy Treatment Patient Details Name: Kaitlin Cantrell MRN: [id number] DOB: [birth year] Today's Date: 02/23/2023   History of present illness Patient is a 67 year old female with acute hypoxic respiratory failure, COPD exacerbation in the setting of multifocal pneumonia.   OT comments  Pt at this time presented while sitting at EOB and requiring to use the bathroom. Pt was able to complete with CGA and pt  was able to push portable tank. Kaitlin Cantrell reported she has been attempting to toliet without o2 donned when not with therapist. Pt noted when on 4L of o2 via [place] was 94%. However, after ambulation still at 4L of o2 via [place] destated to 89% but was able to recover while sitting and focused on breathing techniques. She was also educated home energy conservation techniques with the return home throughout the session.        If plan is discharge home, recommend the following:  A little help with bathing/dressing/bathroom;Assistance with cooking/housework;Assist for transportation;Help with stairs or ramp for entrance   Equipment Recommendations  None recommended by OT    Recommendations for Other Services      Precautions / Restrictions Precautions Precautions: Fall Precaution Comments: watch o2 Restrictions Weight Bearing Restrictions: No       Mobility Bed Mobility               General bed mobility comments: presented sitting at EOB    Transfers Overall transfer level: Modified independent                 General transfer comment: increase in time     Balance Overall balance assessment: Needs assistance Sitting-balance support: Feet supported Sitting balance-Leahy Scale: Good     Standing balance support: No upper extremity supported Standing balance-Leahy Scale: Fair Standing balance comment: pt limited due to back pain                           ADL either performed or assessed with clinical judgement   ADL Overall ADL's  : Needs assistance/impaired Eating/Feeding: Independent;Sitting   Grooming: Wash/dry hands;Wash/dry face;Supervision/safety;Sitting;Cueing for safety;Cueing for sequencing   Upper Body Bathing: Set up;Sitting   Lower Body Bathing: Contact guard assist;Cueing for safety;Cueing for sequencing;Sit to/from stand   Upper Body Dressing : Set up;Sitting   Lower Body Dressing: Contact guard assist;Sit to/from stand   Toilet Transfer: Contact guard assist;Cueing for safety;Cueing for sequencing;Rolling walker (2 wheels)   Toileting- Clothing Manipulation and Hygiene: Contact guard assist;Cueing for safety;Cueing for sequencing;Sit to/from stand       Functional mobility during ADLs: Contact guard assist General ADL Comments: Pt was able to push o2 tank    Extremity/Trunk Assessment Upper Extremity Assessment Upper Extremity Assessment: Defer to OT evaluation   Lower Extremity Assessment Lower Extremity Assessment: Defer to PT evaluation        Vision       Perception     Praxis      Cognition Arousal: Alert Behavior During Therapy: [facility] for tasks assessed/performed Overall Cognitive Status: Within Functional Limits for tasks assessed                                          Exercises      Shoulder Instructions       General Comments administered energy conservation handout    Pertinent Vitals/  Pain       Pain Assessment Pain Assessment: No/denies pain  Home Living                                          Prior Functioning/Environment              Frequency  Min 1X/week        Progress Toward Goals  OT Goals(current goals can now be found in the care plan section)  Progress towards OT goals: Progressing toward goals  Acute Rehab OT Goals Patient Stated Goal: to go home OT Goal Formulation: With patient Time For Goal Achievement: 03/07/23 Potential to Achieve Goals: Good ADL Goals Pt Will Perform Grooming:  with modified independence;sitting Pt Will Transfer to Toilet: with modified independence;ambulating Additional ADL Goal #1: Pt will able to reach 2/3 energy conservation stratigies Additional ADL Goal #2: Pt will be able to tolerate 10 mins of standing ADLS to increase in endurance to return home  Plan      Co-evaluation                 AM-PAC OT "6 Clicks" Daily Activity     Outcome Measure   Help from another person eating meals?: None Help from another person taking care of personal grooming?: A Little Help from another person toileting, which includes using toliet, bedpan, or urinal?: A Little Help from another person bathing (including washing, rinsing, drying)?: A Little Help from another person to put on and taking off regular upper body clothing?: None Help from another person to put on and taking off regular lower body clothing?: A Little 6 Click Score: 20    End of Session Equipment Utilized During Treatment: Gait belt;Rolling walker (2 wheels)  OT Visit Diagnosis: Unsteadiness on feet (R26.81);Other abnormalities of gait and mobility (R26.89);Muscle weakness (generalized) (M62.81)   Activity Tolerance Patient tolerated treatment well   Patient Left in chair;with call bell/phone within reach   Nurse Communication Mobility status        Time: 0822-0858 OT Time Calculation (min): 36 min  Charges: OT General Charges $OT Visit: 1 Visit OT Treatments $Self Care/Home Management : 23-37 mins  Presley Raddle OTR/L  Acute Rehab Services  [phone] office number   Alphia Moh 02/23/2023, 12:52 PM

## 2023-02-23 NOTE — Progress Notes (Signed)
Physical Therapy Treatment Patient Details Name: Kaitlin Cantrell MRN: [id number] DOB: Mar 02, 1956 Today's Date: 02/23/2023   History of Present Illness Patient is a 67 year old female with acute hypoxic respiratory failure, COPD exacerbation in the setting of multifocal pneumonia.    PT Comments  Pt greeted resting in bed and agreeable to session with improved tolerance for gait. Pt able to maintain SpO2 >92% on 2L throughout activity with cues for breathing techniques, decreased talking and slower gait speed with good return. Pt requiring grossly supervision for hallway distance gait without AD support with x1 standing rest break due to DOE 2-3/4. Educated pt on appropriate activity progression and importance of continues mobility with pt verbalizing understanding. Pt continues to benefit from skilled PT services to progress toward functional mobility goals.      If plan is discharge home, recommend the following: Assist for transportation;Help with stairs or ramp for entrance   Can travel by private vehicle        Equipment Recommendations  None recommended by PT    Recommendations for Other Services       Precautions / Restrictions Precautions Precautions: Fall Precaution Comments: watch o2 Restrictions Weight Bearing Restrictions: No     Mobility  Bed Mobility Overal bed mobility: Modified Independent                  Transfers Overall transfer level: Modified independent                 General transfer comment: increase in time    Ambulation/Gait Ambulation/Gait assistance: Contact guard assist Gait Distance (Feet): 120 Feet (x2 with standing rest break) Assistive device: None Gait Pattern/deviations: Step-through pattern, Decreased stride length, Staggering left Gait velocity: variable     General Gait Details: no LOB, standing rest due to DOE 2-3/4, cues for less talking with ambulation and slower gait speed, HR up to 119   Stairs              Wheelchair Mobility     Tilt Bed    Modified Rankin (Stroke Patients Only)       Balance Overall balance assessment: Needs assistance Sitting-balance support: Feet supported Sitting balance-Leahy Scale: Good     Standing balance support: No upper extremity supported Standing balance-Leahy Scale: Fair                              Cognition Arousal: Alert Behavior During Therapy: WFL for tasks assessed/performed Overall Cognitive Status: Within Functional Limits for tasks assessed                                          Exercises      General Comments General comments (skin integrity, edema, etc.): SpO2 >92% on 2L throughout activity      Pertinent Vitals/Pain Pain Assessment Pain Assessment: No/denies pain    Home Living                          Prior Function            PT Goals (current goals can now be found in the care plan section) Acute Rehab PT Goals PT Goal Formulation: With patient Time For Goal Achievement: 02/28/23 Progress towards PT goals: Progressing toward goals    Frequency  Min 1X/week      PT Plan      Co-evaluation              AM-PAC PT "6 Clicks" Mobility   Outcome Measure  Help needed turning from your back to your side while in a flat bed without using bedrails?: None Help needed moving from lying on your back to sitting on the side of a flat bed without using bedrails?: None Help needed moving to and from a bed to a chair (including a wheelchair)?: None Help needed standing up from a chair using your arms (e.g., wheelchair or bedside chair)?: None Help needed to walk in hospital room?: A Little Help needed climbing 3-5 steps with a railing? : A Lot 6 Click Score: 21    End of Session Equipment Utilized During Treatment: Oxygen Activity Tolerance: Patient tolerated treatment well Patient left: in bed;with call bell/phone within reach Nurse Communication:  Mobility status PT Visit Diagnosis: Muscle weakness (generalized) (M62.81);Unsteadiness on feet (R26.81)     Time: 9562-1308 PT Time Calculation (min) (ACUTE ONLY): 16 min  Charges:    $Gait Training: 8-22 mins PT General Charges $$ ACUTE PT VISIT: 1 Visit                     [name] R. PTA Acute Rehabilitation Services Office: [phone]   Catalina Antigua 02/23/2023, 4:13 PM

## 2023-02-23 NOTE — Progress Notes (Addendum)
Mobility Specialist Progress Note:    02/23/23 0938  Mobility  Activity Ambulated with assistance in hallway  Level of Assistance Modified independent, requires aide device or extra time  Assistive Device None  Distance Ambulated (ft) 120 ft  Activity Response Tolerated well  Mobility Referral Yes  $Mobility charge 1 Mobility  Mobility Specialist Start Time (ACUTE ONLY) [id number]  Mobility Specialist Stop Time (ACUTE ONLY) 0951  Mobility Specialist Time Calculation (min) (ACUTE ONLY) 13 min   Pt received in chair, agreeable to mobility session. Tolerated well, took 2 standing rest breaks during session d/t audible SOB and fatigue. See sats below. Returned to room, sitting comfortably in chair, all needs met.   Pre Mobility: SpO2 97% on 4L During Mobility:  SpO2 85% on 4L(halfway through session), took 1 standing rest break, performed pursed lips breathing, increased O2 flow rate, SpO2 90% on 6L.  Post Mobility: SpO2 100% on 4L  Feliciana Rossetti Mobility Specialist Please contact via Special educational needs teacher or  Rehab office at [phone]

## 2023-02-23 NOTE — Progress Notes (Signed)
Mobility Specialist Progress Note:  Nurse requested Mobility Specialist to perform oxygen saturation test with pt which includes removing pt from oxygen both at rest and while ambulating.  Below are the results from that testing.     Patient Saturations on Room Air at Rest = spO2 94%  Patient Saturations on Room Air while Ambulating = sp02 88% .  Rested and performed pursed lip breathing for 1 minute with sp02 at 84%.  Patient Saturations on 2 Liters of oxygen while Ambulating = sp02 95%  At end of testing pt left in room on 2  Liters of oxygen.  Reported results to nurse.   Feliciana Rossetti Mobility Specialist Please contact via Special educational needs teacher or  Rehab office at [phone]

## 2023-02-23 NOTE — Progress Notes (Signed)
HOSPITALIST ROUNDING NOTE HARRIE SCHWANTZ [id number]  DOB: 02-09-1956  DOA: 02/20/2023  PCP: [name], MD  02/23/2023,11:14 AM   LOS: 2 days      Code Status: Full From: Home  current Dispo: None    67 year old white female COPD continued tobacco 1 pack/day  HTN DM TY 2 atrial flutter/Xarelto CHADVASC >3 , HF recovered EF COVID infection 2021 PE 2017 CKD 3B Recent hospitalization 01/29/2023 sepsis secondary to COVID with unresponsiveness-was hypoglycemic at that time and improved and was discharged home  RTC 01/2244-day SOB green sputum + wheeze + oxygen requirement  [Albuterol allergy]? BUN/creatinine 20/1.4, potassium 3.8, BNP 179 troponin flat White count 10.5 hemoglobin 9.2 platelet 339 CXR = cardiomegaly distended pulmonary vasculature interstitial bilateral prominent airspace disease at lung bases possible edema    Plan Multifactorial dyspnea Recurrent pneumonia in the setting of recent COVID Possible component HFpEF as well asrestrictive habitus Narrowed azithromycin +ceftriaxone-->Levaquin to complete 5 days 10/25  Acute decompnesated HfpEF--some volume overload Not entirely convinced that this is only PNA?-Does not check weights at home-see below Outpatient evaluation [facility] Per primary physician Increase oral to bid IV lasix 40, daily standing weights, desat screen shows may not require lt Oxygen Last hospital stay 104 Kg, now 120 Stand wght mandatory, strict I/o D/w nursing  Continued chronic smoker with COPD at baseline with albuterol allergy  Solu-Medrol given hyperglycemia this morning-changed to prednisone 20 stop date added Allergy to albuterol so may use Xopenex  Submassive PE in 2017 Some NSVT 10/23 Atrial flutter CHADVASC >3 on Xarelto Continues on Xarelto continue metoprolol XL 12.5 daily Keep on monitors   DM TY 2 A1c 7.7 on 12/2022 with hyperglycemia this hospitalization from steroids Continues on Amaryl 4 mg twice daily meals, moderate  sliding scale Give extra levemir 10 this am Then go to 15 levemir bid tomorrow as CBD > 250  CKD 3  Hyperkalemia spurious Entresto held-May resume as outpatient  DVT prophylaxis: Xarelto  Status is: Inpatient Remains inpatient appropriate because: Requires more diuresis      Subjective: Note desat screen No cp fever No cough now Feels slightly less swollen than prior  Objective + exam Vitals:   02/22/23 1932 02/23/23 0501 02/23/23 0503 02/23/23 0753  BP: (!) 160/89 (!) 173/97 (!) 170/89 (!) 172/90  Pulse: 89 97 96 99  Resp: 18 17  18   Temp: 98.1 F (36.7 C) 98.3 F (36.8 C)  98 F (36.7 C)  TempSrc: Oral Oral  Oral  SpO2: 99% 100% 100% 98%  Weight:   121.7 kg   Height:       Filed Weights   02/20/23 2006 02/23/23 0503  Weight: 117.9 kg 121.7 kg    Examination:  EOMI NCAT JVD noted Decreased AE posteriroly Abd obese nt nd  Power 4 limbs  5/5 Grade 2 LE edema   Data Reviewed: reviewed   CBC    Component Value Date/Time   WBC 15.5 (H) 02/22/2023 0456   RBC 3.84 (L) 02/22/2023 0456   HGB 9.4 (L) 02/22/2023 0456   HGB 10.7 (L) 01/10/2023 1138   HCT 32.5 (L) 02/22/2023 0456   HCT 35.8 01/10/2023 1138   PLT 370 02/22/2023 0456   PLT 362 01/10/2023 1138   MCV 84.6 02/22/2023 0456   MCV 86 01/10/2023 1138   MCH 24.5 (L) 02/22/2023 0456   MCHC 28.9 (L) 02/22/2023 0456   RDW 16.3 (H) 02/22/2023 0456   RDW 14.2 01/10/2023 1138   LYMPHSABS 1.0 02/22/2023 0456  LYMPHSABS 1.5 01/10/2023 1138   MONOABS 1.3 (H) 02/22/2023 0456   EOSABS 0.0 02/22/2023 0456   EOSABS 0.3 01/10/2023 1138   BASOSABS 0.0 02/22/2023 0456   BASOSABS 0.0 01/10/2023 1138      Latest Ref Rng & Units 02/23/2023    6:28 AM 02/22/2023    6:40 PM 02/22/2023    7:59 AM  CMP  Glucose 70 - 99 mg/dL 098  119    BUN 8 - 23 mg/dL 28  27    Creatinine 1.47 - 1.00 mg/dL 8.29  5.62    Sodium 130 - 145 mmol/L 134  135    Potassium 3.5 - 5.1 mmol/L 4.8  4.6  4.3   Chloride 98 - 111  mmol/L 96  95    CO2 22 - 32 mmol/L 29  26    Calcium 8.9 - 10.3 mg/dL 9.0  9.4       Scheduled Meds:  amLODipine  5 mg Oral Daily   atorvastatin  80 mg Oral Daily   ferrous sulfate  325 mg Oral Daily   furosemide  40 mg Intravenous BID   gabapentin  200 mg Oral BID   glimepiride  4 mg Oral BID WC   insulin aspart  0-15 Units Subcutaneous TID WC   insulin aspart  0-5 Units Subcutaneous QHS   [START ON 02/24/2023] insulin glargine-yfgn  10 Units Subcutaneous QHS   [START ON 02/24/2023] insulin glargine-yfgn  15 Units Subcutaneous BID   levofloxacin  500 mg Oral Daily   magnesium oxide  400 mg Oral QHS   metoprolol  12.5 mg Oral Daily   mometasone-formoterol  2 puff Inhalation BID   pantoprazole  40 mg Oral Daily   predniSONE  20 mg Oral QAC breakfast   rivaroxaban  20 mg Oral Daily   Continuous Infusions:    Time 45  Rhetta Mura, MD  [facility]

## 2023-02-23 NOTE — Plan of Care (Signed)
  Problem: Education: Goal: Ability to describe self-care measures that may prevent or decrease complications (Diabetes Survival Skills Education) will improve Outcome: Progressing Goal: Individualized Educational Video(s) Outcome: Progressing   Problem: Coping: Goal: Ability to adjust to condition or change in health will improve Outcome: Progressing   Problem: Fluid Volume: Goal: Ability to maintain a balanced intake and output will improve Outcome: Progressing   Problem: Health Behavior/Discharge Planning: Goal: Ability to identify and utilize available resources and services will improve Outcome: Progressing Goal: Ability to manage health-related needs will improve Outcome: Progressing   Problem: Fluid Volume: Goal: Ability to maintain a balanced intake and output will improve Outcome: Progressing

## 2023-02-24 DIAGNOSIS — J9601 Acute respiratory failure with hypoxia: Secondary | ICD-10-CM | POA: Diagnosis not present

## 2023-02-24 LAB — BASIC METABOLIC PANEL
Anion gap: 12 (ref 5–15)
BUN: 35 mg/dL — ABNORMAL HIGH (ref 8–23)
CO2: 31 mmol/L (ref 22–32)
Calcium: 9.5 mg/dL (ref 8.9–10.3)
Chloride: 95 mmol/L — ABNORMAL LOW (ref 98–111)
Creatinine, Ser: 1.45 mg/dL — ABNORMAL HIGH (ref 0.44–1.00)
GFR, Estimated: 40 mL/min — ABNORMAL LOW (ref >60)
Glucose, Bld: 300 mg/dL — ABNORMAL HIGH (ref 70–99)
Potassium: 5.3 mmol/L — ABNORMAL HIGH (ref 3.5–5.1)
Sodium: 138 mmol/L (ref 135–145)

## 2023-02-24 LAB — GLUCOSE, CAPILLARY
Glucose-Capillary: 158 mg/dL — ABNORMAL HIGH (ref 70–99)
Glucose-Capillary: 355 mg/dL — ABNORMAL HIGH (ref 70–99)
Glucose-Capillary: 332 mg/dL — ABNORMAL HIGH (ref 70–99)
Glucose-Capillary: 254 mg/dL — ABNORMAL HIGH (ref 70–99)
Glucose-Capillary: 218 mg/dL — ABNORMAL HIGH (ref 70–99)

## 2023-02-24 MED ORDER — FUROSEMIDE 10 MG/ML IJ SOLN
40.0000 mg | Freq: Every day | INTRAMUSCULAR | Status: DC
  Administered 2023-02-25: 40 mg via INTRAVENOUS
  Filled 2023-02-24: qty 4

## 2023-02-24 NOTE — Plan of Care (Signed)

## 2023-02-24 NOTE — Progress Notes (Addendum)
Mobility Specialist Progress Note:   02/24/23 1201  Mobility  Activity Ambulated with assistance in hallway  Level of Assistance Modified independent, requires aide device or extra time  Assistive Device None  Distance Ambulated (ft) 120 ft  Activity Response Tolerated fair  Mobility Referral Yes  $Mobility charge 1 Mobility  Mobility Specialist Start Time (ACUTE ONLY) 1135  Mobility Specialist Stop Time (ACUTE ONLY) 1200  Mobility Specialist Time Calculation (min) (ACUTE ONLY) 25 min   Pt received sitting EOB, family member in room. Agreeable to mobility session. Tolerated fairly, required 2 rest breaks d/t fatigue and tachy. SpO2 90-97% on RA throughout session. Max HR 148 bpm during session. Pt encouraged to take additional rest breaks to recover, but refused despite multiple attempts. Returned pt to room, SpO2 100% on RA, HR 110 bpm. Nurse notified. All needs met.   Feliciana Rossetti Mobility Specialist Please contact via Special educational needs teacher or  Rehab office at [phone]

## 2023-02-24 NOTE — Care Management Important Message (Signed)
Important Message  Patient Details  Name: Kaitlin Cantrell MRN: [id number] Date of Birth: [birth year]   Important Message Given:  Yes - Medicare IM     Dorena Bodo 02/24/2023, 2:21 PM

## 2023-02-24 NOTE — Progress Notes (Signed)
HOSPITALIST ROUNDING NOTE Kaitlin Cantrell [id number]  DOB: [birth year]  DOA: 02/20/2023  PCP: [name], MD  02/24/2023,4:34 PM   LOS: 3 days      Code Status: Full From: Home  current Dispo: None    67 year old white female COPD continued tobacco 1 pack/day  HTN DM TY 2 atrial flutter/Xarelto CHADVASC >3 , HF recovered EF COVID infection 2021 PE 2017 CKD 3B Recent hospitalization 01/29/2023 sepsis secondary to COVID with unresponsiveness-was hypoglycemic at that time and improved and was discharged home  RTC 01/2244-day SOB green sputum + wheeze + oxygen requirement  [Albuterol allergy]? BUN/creatinine 20/1.4, potassium 3.8, BNP 179 troponin flat White count 10.5 hemoglobin 9.2 platelet 339 CXR = cardiomegaly distended pulmonary vasculature interstitial bilateral prominent airspace disease at lung bases possible edema    Plan Multifactorial dyspnea Recurrent pneumonia in the setting of recent COVID Possible component HFpEF as well asrestrictive habitus Narrowed azithromycin +ceftriaxone-->Levaquin to complete 5 days 10/25  Acute decompnesated HfpEF--some volume overload wght down to 118--Last hospital stay 104 Kg--lasix now 40 IV daily Stand wght mandatory, strict I/o D/w nursing Not requiring oxygen now Needs to be close to ~110 kilos to be able to d/c---not appropriate to d/c till reaches a drier weight  Continued chronic smoker with COPD at baseline with albuterol allergy  Solu-Medrol given hyperglycemia this morning-changed to prednisone 20 ---stopped now as no wheeze no O2 req Allergy  albuterol so may use Xopenex  Submassive PE in 2017 Some NSVT 10/23 Atrial flutter CHADVASC >3 on Xarelto Continues on Xarelto continue metoprolol XL 12.5 daily Keep on monitors  DM TY 2 A1c 7.7 on 12/2022 with hyperglycemia this hospitalization from steroids Continues on Amaryl 4 mg twice daily meals, moderate sliding scale--sugars still uncontrolled ~ 330 15 levemir bid  --expect will need less now that steroids stopped  CKD 3  Hyperkalemia again--stop high dose lasix Entresto held-May resume as outpatient  DVT prophylaxis: Xarelto  Status is: Inpatient Remains inpatient appropriate because: Requires more diuresis      Subjective:  Walking better without oxygen Still a little winded--not yet clsoe to dry wght Discussed with daughter bedside  Objective + exam Vitals:   02/24/23 0726 02/24/23 1214 02/24/23 1500 02/24/23 1600  BP: (!) 158/85 (!) 154/96 (!) 141/77   Pulse: 98 (!) 109 99   Resp: 19 19 18    Temp: 98.5 F (36.9 C) 99 F (37.2 C) 98.7 F (37.1 C)   TempSrc:   Oral   SpO2: 93% 96% 95%   Weight:    118.3 kg  Height:       Filed Weights   02/23/23 1211 02/24/23 0600 02/24/23 1600  Weight: 119.7 kg 120 kg 118.3 kg    Examination:  EOMI NCAT JVD less, Periph edema less Decreased AE posteriroly Abd obese nt nd  Power 4 limbs  5/5   Data Reviewed: reviewed   CBC    Component Value Date/Time   WBC 15.5 (H) 02/22/2023 0456   RBC 3.84 (L) 02/22/2023 0456   HGB 9.4 (L) 02/22/2023 0456   HGB 10.7 (L) 01/10/2023 1138   HCT 32.5 (L) 02/22/2023 0456   HCT 35.8 01/10/2023 1138   PLT 370 02/22/2023 0456   PLT 362 01/10/2023 1138   MCV 84.6 02/22/2023 0456   MCV 86 01/10/2023 1138   MCH 24.5 (L) 02/22/2023 0456   MCHC 28.9 (L) 02/22/2023 0456   RDW 16.3 (H) 02/22/2023 0456   RDW 14.2 01/10/2023 1138   LYMPHSABS 1.0  02/22/2023 0456   LYMPHSABS 1.5 01/10/2023 1138   MONOABS 1.3 (H) 02/22/2023 0456   EOSABS 0.0 02/22/2023 0456   EOSABS 0.3 01/10/2023 1138   BASOSABS 0.0 02/22/2023 0456   BASOSABS 0.0 01/10/2023 1138      Latest Ref Rng & Units 02/24/2023    9:46 AM 02/23/2023    6:28 AM 02/22/2023    6:40 PM  CMP  Glucose 70 - 99 mg/dL 696  295  284   BUN 8 - 23 mg/dL 35  28  27   Creatinine 0.44 - 1.00 mg/dL 1.32  4.40  1.02   Sodium 135 - 145 mmol/L 138  134  135   Potassium 3.5 - 5.1 mmol/L 5.3  4.8  4.6    Chloride 98 - 111 mmol/L 95  96  95   CO2 22 - 32 mmol/L 31  29  26    Calcium 8.9 - 10.3 mg/dL 9.5  9.0  9.4      Scheduled Meds:  amLODipine  5 mg Oral Daily   atorvastatin  80 mg Oral Daily   ferrous sulfate  325 mg Oral Daily   [START ON 02/25/2023] furosemide  40 mg Intravenous Daily   gabapentin  200 mg Oral BID   glimepiride  4 mg Oral BID WC   insulin aspart  0-15 Units Subcutaneous TID WC   insulin aspart  0-5 Units Subcutaneous QHS   insulin glargine-yfgn  15 Units Subcutaneous BID   magnesium oxide  400 mg Oral QHS   metoprolol  12.5 mg Oral Daily   mometasone-formoterol  2 puff Inhalation BID   pantoprazole  40 mg Oral Daily   predniSONE  20 mg Oral QAC breakfast   rivaroxaban  20 mg Oral Daily   Continuous Infusions:    Time 45  Rhetta Mura, MD  [facility]

## 2023-02-25 DIAGNOSIS — J9601 Acute respiratory failure with hypoxia: Secondary | ICD-10-CM | POA: Diagnosis not present

## 2023-02-25 LAB — BASIC METABOLIC PANEL
Anion gap: 10 (ref 5–15)
BUN: 40 mg/dL — ABNORMAL HIGH (ref 8–23)
CO2: 34 mmol/L — ABNORMAL HIGH (ref 22–32)
Calcium: 9.3 mg/dL (ref 8.9–10.3)
Chloride: 95 mmol/L — ABNORMAL LOW (ref 98–111)
Creatinine, Ser: 1.36 mg/dL — ABNORMAL HIGH (ref 0.44–1.00)
GFR, Estimated: 43 mL/min — ABNORMAL LOW (ref >60)
Glucose, Bld: 200 mg/dL — ABNORMAL HIGH (ref 70–99)
Potassium: 4.7 mmol/L (ref 3.5–5.1)
Sodium: 139 mmol/L (ref 135–145)

## 2023-02-25 LAB — GLUCOSE, CAPILLARY
Glucose-Capillary: 145 mg/dL — ABNORMAL HIGH (ref 70–99)
Glucose-Capillary: 148 mg/dL — ABNORMAL HIGH (ref 70–99)
Glucose-Capillary: 161 mg/dL — ABNORMAL HIGH (ref 70–99)
Glucose-Capillary: 216 mg/dL — ABNORMAL HIGH (ref 70–99)
Glucose-Capillary: 233 mg/dL — ABNORMAL HIGH (ref 70–99)

## 2023-02-25 LAB — MAGNESIUM: Magnesium: 1.9 mg/dL (ref 1.7–2.4)

## 2023-02-25 MED ORDER — METOPROLOL SUCCINATE ER 25 MG PO TB24
25.0000 mg | ORAL_TABLET | Freq: Every day | ORAL | Status: DC
  Administered 2023-02-25 – 2023-02-28 (×4): 25 mg via ORAL
  Filled 2023-02-25 (×4): qty 1

## 2023-02-25 NOTE — Progress Notes (Signed)
HOSPITALIST ROUNDING NOTE Kaitlin Cantrell WJX:[id number]  DOB: 08-Jul-1955  DOA: 02/20/2023  PCP: [name], MD  02/25/2023,9:48 AM   LOS: 4 days      Code Status: Full From: Home  current Dispo: None    67 year old white female COPD continued tobacco 1 pack/day  HTN DM TY 2 atrial flutter/Xarelto CHADVASC >3 , HF recovered EF COVID infection 2021 PE 2017 CKD 3B Recent hospitalization 01/29/2023 sepsis secondary to COVID with unresponsiveness-was hypoglycemic at that time and improved and was discharged home  RTC 01/2244-day SOB green sputum + wheeze + oxygen requirement  [Albuterol allergy]? BUN/creatinine 20/1.4, potassium 3.8, BNP 179 troponin flat White count 10.5 hemoglobin 9.2 platelet 339 CXR = cardiomegaly distended pulmonary vasculature interstitial bilateral prominent airspace disease at lung bases possible edema    Plan Multifactorial dyspnea Recurrent pneumonia in the setting of recent COVID Possible component HFpEF as well as restrictive habitus Narrowed azithromycin +ceftriaxone-->Levaquin to complete 5 days 10/25  Acute decompnesated HfpEF--some volume overload wght down to 117, -2.3 L--Last hospital stay 104 Kg--Lasix 40 IV daily careful monitoring as becoming a little bit alkalotic Not requiring oxygen now Needs to be close to ~110 kilos to be able to d/c-appropriately otherwise will be right back in hospital  Continued chronic smoker with COPD at baseline with albuterol allergy hyperGlycemia will resolve as all steroids were discontinued on 10/25 Allergy  albuterol so may use Xopenex  Submassive PE in 2017 Some NSVT 10/23 but mainly is sinus tach on monitors---Atrial flutter CHADVASC >3 on Xarelto Continues on Xarelto This admission increase metoprolol to 25 daily XL   DM TY 2 A1c 7.7 on 12/2022 with hyperglycemia this hospitalization from steroids Continues on Amaryl 4 mg twice daily meals, moderate sliding scale--sugars are in the 180 rangeb 15  levemir bid  continue gabapentin 200 twice daily  CKD 3  Hyperkalemia again--stop high dose lasix-seems to have resolved some Entresto held-May resume as outpatient  DVT prophylaxis: Xarelto  Status is: Inpatient Remains inpatient appropriate because: Requires more diuresis      Subjective:  Not on oxygen at all now-looks well feels fair Motivated to walk this morning although did not sleep well last night No chest pain Feels edema is improved  Objective + exam Vitals:   02/25/23 0034 02/25/23 0316 02/25/23 0500 02/25/23 0731  BP: (!) 158/91 (!) 155/80  (!) 176/98  Pulse: (!) 103 (!) 105  (!) 101  Resp: 18 18  18   Temp: 98 F (36.7 C) 98.3 F (36.8 C)  98.1 F (36.7 C)  TempSrc:  Oral  Oral  SpO2: 90% (!) 84%  91%  Weight:   117.3 kg   Height:       Filed Weights   02/24/23 0600 02/24/23 1600 02/25/23 0500  Weight: 120 kg 118.3 kg 117.3 kg    Examination:  JVD still present, grade 1 lower extremity edema Thick neck Chest clear Abdomen soft obese ROM intact pleasant affect   Data Reviewed: reviewed   CBC    Component Value Date/Time   WBC 15.5 (H) 02/22/2023 0456   RBC 3.84 (L) 02/22/2023 0456   HGB 9.4 (L) 02/22/2023 0456   HGB 10.7 (L) 01/10/2023 1138   HCT 32.5 (L) 02/22/2023 0456   HCT 35.8 01/10/2023 1138   PLT 370 02/22/2023 0456   PLT 362 01/10/2023 1138   MCV 84.6 02/22/2023 0456   MCV 86 01/10/2023 1138   MCH 24.5 (L) 02/22/2023 0456   MCHC 28.9 (L) 02/22/2023  0456   RDW 16.3 (H) 02/22/2023 0456   RDW 14.2 01/10/2023 1138   LYMPHSABS 1.0 02/22/2023 0456   LYMPHSABS 1.5 01/10/2023 1138   MONOABS 1.3 (H) 02/22/2023 0456   EOSABS 0.0 02/22/2023 0456   EOSABS 0.3 01/10/2023 1138   BASOSABS 0.0 02/22/2023 0456   BASOSABS 0.0 01/10/2023 1138      Latest Ref Rng & Units 02/25/2023    4:18 AM 02/24/2023    9:46 AM 02/23/2023    6:28 AM  CMP  Glucose 70 - 99 mg/dL 742  595  638   BUN 8 - 23 mg/dL 40  35  28   Creatinine 0.44 - 1.00  mg/dL 7.56  4.33  2.95   Sodium 135 - 145 mmol/L 139  138  134   Potassium 3.5 - 5.1 mmol/L 4.7  5.3  4.8   Chloride 98 - 111 mmol/L 95  95  96   CO2 22 - 32 mmol/L 34  31  29   Calcium 8.9 - 10.3 mg/dL 9.3  9.5  9.0      Scheduled Meds:  amLODipine  5 mg Oral Daily   atorvastatin  80 mg Oral Daily   ferrous sulfate  325 mg Oral Daily   furosemide  40 mg Intravenous Daily   gabapentin  200 mg Oral BID   glimepiride  4 mg Oral BID WC   insulin aspart  0-15 Units Subcutaneous TID WC   insulin aspart  0-5 Units Subcutaneous QHS   insulin glargine-yfgn  15 Units Subcutaneous BID   magnesium oxide  400 mg Oral QHS   metoprolol  12.5 mg Oral Daily   mometasone-formoterol  2 puff Inhalation BID   pantoprazole  40 mg Oral Daily   rivaroxaban  20 mg Oral Daily   Continuous Infusions:    Time 45  Rhetta Mura, MD  [facility]

## 2023-02-25 NOTE — Plan of Care (Signed)
  Problem: Education: Goal: Ability to describe self-care measures that may prevent or decrease complications (Diabetes Survival Skills Education) will improve Outcome: Progressing Goal: Individualized Educational Video(s) Outcome: Progressing   Problem: Coping: Goal: Ability to adjust to condition or change in health will improve Outcome: Progressing   Problem: Fluid Volume: Goal: Ability to maintain a balanced intake and output will improve Outcome: Progressing   Problem: Health Behavior/Discharge Planning: Goal: Ability to identify and utilize available resources and services will improve Outcome: Progressing Goal: Ability to manage health-related needs will improve Outcome: Progressing   Problem: Metabolic: Goal: Ability to maintain appropriate glucose levels will improve Outcome: Progressing   Problem: Nutritional: Goal: Maintenance of adequate nutrition will improve Outcome: Progressing Goal: Progress toward achieving an optimal weight will improve Outcome: Progressing   Problem: Skin Integrity: Goal: Risk for impaired skin integrity will decrease Outcome: Progressing   Problem: Tissue Perfusion: Goal: Adequacy of tissue perfusion will improve Outcome: Progressing   Problem: Education: Goal: Knowledge of General Education information will improve Description: Including pain rating scale, medication(s)/side effects and non-pharmacologic comfort measures Outcome: Progressing   Problem: Health Behavior/Discharge Planning: Goal: Ability to manage health-related needs will improve Outcome: Progressing   Problem: Clinical Measurements: Goal: Ability to maintain clinical measurements within normal limits will improve Outcome: Progressing Goal: Will remain free from infection Outcome: Progressing Goal: Diagnostic test results will improve Outcome: Progressing Goal: Respiratory complications will improve Outcome: Progressing Goal: Cardiovascular complication will  be avoided Outcome: Progressing   Problem: Activity: Goal: Risk for activity intolerance will decrease Outcome: Progressing   Problem: Nutrition: Goal: Adequate nutrition will be maintained Outcome: Progressing   Problem: Coping: Goal: Level of anxiety will decrease Outcome: Progressing   Problem: Elimination: Goal: Will not experience complications related to bowel motility Outcome: Progressing Goal: Will not experience complications related to urinary retention Outcome: Progressing   Problem: Pain Managment: Goal: General experience of comfort will improve Outcome: Progressing   Problem: Safety: Goal: Ability to remain free from injury will improve Outcome: Progressing   

## 2023-02-26 DIAGNOSIS — J9601 Acute respiratory failure with hypoxia: Secondary | ICD-10-CM | POA: Diagnosis not present

## 2023-02-26 LAB — BASIC METABOLIC PANEL
Anion gap: 13 (ref 5–15)
BUN: 37 mg/dL — ABNORMAL HIGH (ref 8–23)
CO2: 29 mmol/L (ref 22–32)
Calcium: 9 mg/dL (ref 8.9–10.3)
Chloride: 95 mmol/L — ABNORMAL LOW (ref 98–111)
Creatinine, Ser: 1.68 mg/dL — ABNORMAL HIGH (ref 0.44–1.00)
GFR, Estimated: 34 mL/min — ABNORMAL LOW (ref >60)
Glucose, Bld: 196 mg/dL — ABNORMAL HIGH (ref 70–99)
Potassium: 4.6 mmol/L (ref 3.5–5.1)
Sodium: 137 mmol/L (ref 135–145)

## 2023-02-26 LAB — GLUCOSE, CAPILLARY
Glucose-Capillary: 245 mg/dL — ABNORMAL HIGH (ref 70–99)
Glucose-Capillary: 229 mg/dL — ABNORMAL HIGH (ref 70–99)
Glucose-Capillary: 214 mg/dL — ABNORMAL HIGH (ref 70–99)
Glucose-Capillary: 183 mg/dL — ABNORMAL HIGH (ref 70–99)

## 2023-02-26 MED ORDER — HYDROCHLOROTHIAZIDE 12.5 MG PO TABS
12.5000 mg | ORAL_TABLET | Freq: Every day | ORAL | Status: DC
  Administered 2023-02-26 – 2023-02-28 (×3): 12.5 mg via ORAL
  Filled 2023-02-26 (×3): qty 1

## 2023-02-26 MED ORDER — CLONIDINE HCL 0.1 MG PO TABS: 0.1000 mg | ORAL_TABLET | Freq: Two times a day (BID) | ORAL | Status: DC | PRN

## 2023-02-26 NOTE — Plan of Care (Signed)

## 2023-02-26 NOTE — Progress Notes (Addendum)
HOSPITALIST ROUNDING NOTE Kaitlin Cantrell [id number]  DOB: November 18, 1955  DOA: 02/20/2023  PCP: [name], MD  02/26/2023,3:56 PM   LOS: 5 days      Code Status: Full From: Home  current Dispo: None    67 year old white female COPD continued tobacco 1 pack/day  HTN DM TY 2 atrial flutter/Xarelto CHADVASC >3 , HF recovered EF COVID infection 2021 PE 2017 CKD 3B Recent hospitalization 01/29/2023 sepsis secondary to COVID with unresponsiveness-was hypoglycemic at that time and improved and was discharged home  RTC 01/2244-day SOB green sputum + wheeze + oxygen requirement  [Albuterol allergy]? BUN/creatinine 20/1.4, potassium 3.8, BNP 179 troponin flat White count 10.5 hemoglobin 9.2 platelet 339 CXR = cardiomegaly distended pulmonary vasculature interstitial bilateral prominent airspace disease at lung bases possible edema  She was initally rx for PNa but then we realized she was vol ovelroaded and was diuresed--we await stability of kidney fucntion as well as her reaching dry weight to ber able to d/c her  Plan Multifactorial dyspnea--Recurrent pneumonia + recent COVID:--acute HFpEF as well as restrictive habitus Needs OP epworth, sleep study? Narrowed azithromycin +ceftriaxone-->Levaquin to complete 5 days 10/25-see below  Acute decompnesated HfpEF--some volume overload wght down to 117, -2.3 L--Last hospital stay 104 Kg--hold all lasix today Not requiring oxygen now Check am labs--change to home lasix dose based on am labs, ambulate  Uncontrolled htn Clonidine 0.1 for BP>160--add hydrochlorothiazide 12.5--cont lowered amlodipine 2.5 and torpol xl 25 qd  Continued chronic smoker with COPD at baseline with albuterol allergy hyperGlycemia will resolve --steroids off now Allergy  albuterol so may use Xopenex  Submassive PE in 2017 Some NSVT 10/23 but mainly is sinus tach on monitors---Atrial flutter CHADVASC >3 on Xarelto Continues on Xarelto This admission increase  metoprolol to 25 daily XL   DM TY 2 A1c 7.7 on 12/2022 with hyperglycemia this hospitalization from steroids Continues on Amaryl 4 mg twice daily meals, moderate sliding scale--sugars improved 15 levemir bid  continue gabapentin 200 twice daily  LLE edema Reports prior DVT--cannot find evidence--already on DOAC Compression hose if able  CKD 3  Hyperkalemia again--stop high dose lasix-seems to have resolved some Entresto held-May resume as outpatient  DVT prophylaxis: Xarelto  Status is: Inpatient Remains inpatient appropriate because: Requires more diuresis      Subjective:  Fair Uop has dropped off No cp fever   Objective + exam Vitals:   02/26/23 0458 02/26/23 0738 02/26/23 0836 02/26/23 1242  BP: (!) 153/65 (!) 146/77    Pulse: 100 (!) 112  (!) 108  Resp: 16 18    Temp: 98.3 F (36.8 C) 99.1 F (37.3 C)  99.1 F (37.3 C)  TempSrc: Oral Oral    SpO2: 92% 94%    Weight:   117.1 kg   Height:       Filed Weights   02/24/23 1600 02/25/23 0500 02/26/23 0836  Weight: 118.3 kg 117.3 kg 117.1 kg    Examination:  Grd 1 LE edema , L>R Thick neck Chest clear Abdomen soft obese ROM intact pleasant affect   Data Reviewed: reviewed   CBC    Component Value Date/Time   WBC 15.5 (H) 02/22/2023 0456   RBC 3.84 (L) 02/22/2023 0456   HGB 9.4 (L) 02/22/2023 0456   HGB 10.7 (L) 01/10/2023 1138   HCT 32.5 (L) 02/22/2023 0456   HCT 35.8 01/10/2023 1138   PLT 370 02/22/2023 0456   PLT 362 01/10/2023 1138   MCV 84.6 02/22/2023 0456  MCV 86 01/10/2023 1138   MCH 24.5 (L) 02/22/2023 0456   MCHC 28.9 (L) 02/22/2023 0456   RDW 16.3 (H) 02/22/2023 0456   RDW 14.2 01/10/2023 1138   LYMPHSABS 1.0 02/22/2023 0456   LYMPHSABS 1.5 01/10/2023 1138   MONOABS 1.3 (H) 02/22/2023 0456   EOSABS 0.0 02/22/2023 0456   EOSABS 0.3 01/10/2023 1138   BASOSABS 0.0 02/22/2023 0456   BASOSABS 0.0 01/10/2023 1138      Latest Ref Rng & Units 02/26/2023    6:21 AM 02/25/2023     4:18 AM 02/24/2023    9:46 AM  CMP  Glucose 70 - 99 mg/dL 161  096  045   BUN 8 - 23 mg/dL 37  40  35   Creatinine 0.44 - 1.00 mg/dL 4.09  8.11  9.14   Sodium 135 - 145 mmol/L 137  139  138   Potassium 3.5 - 5.1 mmol/L 4.6  4.7  5.3   Chloride 98 - 111 mmol/L 95  95  95   CO2 22 - 32 mmol/L 29  34  31   Calcium 8.9 - 10.3 mg/dL 9.0  9.3  9.5      Scheduled Meds:  amLODipine  5 mg Oral Daily   atorvastatin  80 mg Oral Daily   ferrous sulfate  325 mg Oral Daily   gabapentin  200 mg Oral BID   glimepiride  4 mg Oral BID WC   insulin aspart  0-15 Units Subcutaneous TID WC   insulin aspart  0-5 Units Subcutaneous QHS   insulin glargine-yfgn  15 Units Subcutaneous BID   metoprolol  25 mg Oral Daily   pantoprazole  40 mg Oral Daily   rivaroxaban  20 mg Oral Daily   Continuous Infusions:    Time 25  Rhetta Mura, MD  [facility]

## 2023-02-27 DIAGNOSIS — J9601 Acute respiratory failure with hypoxia: Secondary | ICD-10-CM | POA: Diagnosis not present

## 2023-02-27 LAB — GLUCOSE, CAPILLARY
Glucose-Capillary: 210 mg/dL — ABNORMAL HIGH (ref 70–99)
Glucose-Capillary: 213 mg/dL — ABNORMAL HIGH (ref 70–99)
Glucose-Capillary: 228 mg/dL — ABNORMAL HIGH (ref 70–99)
Glucose-Capillary: 253 mg/dL — ABNORMAL HIGH (ref 70–99)
Glucose-Capillary: 178 mg/dL — ABNORMAL HIGH (ref 70–99)

## 2023-02-27 LAB — BASIC METABOLIC PANEL
Anion gap: 11 (ref 5–15)
BUN: 33 mg/dL — ABNORMAL HIGH (ref 8–23)
CO2: 30 mmol/L (ref 22–32)
Calcium: 9 mg/dL (ref 8.9–10.3)
Chloride: 94 mmol/L — ABNORMAL LOW (ref 98–111)
Creatinine, Ser: 1.47 mg/dL — ABNORMAL HIGH (ref 0.44–1.00)
GFR, Estimated: 39 mL/min — ABNORMAL LOW (ref >60)
Glucose, Bld: 275 mg/dL — ABNORMAL HIGH (ref 70–99)
Potassium: 4.5 mmol/L (ref 3.5–5.1)
Sodium: 135 mmol/L (ref 135–145)

## 2023-02-27 MED ORDER — FUROSEMIDE 40 MG PO TABS
40.0000 mg | ORAL_TABLET | Freq: Every day | ORAL | Status: DC
  Administered 2023-02-27 – 2023-02-28 (×2): 40 mg via ORAL
  Filled 2023-02-27 (×2): qty 1

## 2023-02-27 MED ORDER — HYDROXYZINE HCL 10 MG/5ML PO SYRP
10.0000 mg | ORAL_SOLUTION | Freq: Three times a day (TID) | ORAL | Status: DC | PRN
  Filled 2023-02-27 (×2): qty 5

## 2023-02-27 NOTE — Progress Notes (Signed)
Occupational Therapy Treatment and Discharge Patient Details Name: Kaitlin Cantrell MRN: [id number] DOB: January 18, 1956 Today's Date: 02/27/2023   History of present illness Patient is a 67 year old female with acute hypoxic respiratory failure, COPD exacerbation in the setting of multifocal pneumonia.   OT comments  Pt is functioning mod I in self care. Showered seated on 3 in 1. Reinforced daily weights, low sodium diet and energy conservation strategies. No further OT needs.       If plan is discharge home, recommend the following:  Assistance with cooking/housework   Equipment Recommendations  None recommended by OT    Recommendations for Other Services      Precautions / Restrictions Precautions Precaution Comments: watch o2       Mobility Bed Mobility Overal bed mobility: Modified Independent                  Transfers Overall transfer level: Modified independent Equipment used: None                     Balance   Sitting-balance support: Feet supported Sitting balance-Leahy Scale: Good       Standing balance-Leahy Scale: Good                             ADL either performed or assessed with clinical judgement   ADL Overall ADL's : Modified independent                         Toilet Transfer: Modified Independent   Toileting- Clothing Manipulation and Hygiene: Modified independent   Tub/ Shower Transfer: Modified independent;BSC/3in1;Walk-in shower   Functional mobility during ADLs: Modified independent General ADL Comments: Reinforced energy conservation strategies.    Extremity/Trunk Assessment              Vision       Perception     Praxis      Cognition Arousal: Alert Behavior During Therapy: WFL for tasks assessed/performed Overall Cognitive Status: Within Functional Limits for tasks assessed                                          Exercises      Shoulder Instructions        General Comments      Pertinent Vitals/ Pain       Pain Assessment Pain Assessment: No/denies pain  Home Living                                          Prior Functioning/Environment              Frequency           Progress Toward Goals  OT Goals(current goals can now be found in the care plan section)  Progress towards OT goals: Goals met/education completed, patient discharged from OT     Plan      Co-evaluation                 AM-PAC OT "6 Clicks" Daily Activity     Outcome Measure   Help from another person eating meals?: None Help from another person taking care of personal grooming?: None Help from  another person toileting, which includes using toliet, bedpan, or urinal?: None Help from another person bathing (including washing, rinsing, drying)?: None Help from another person to put on and taking off regular upper body clothing?: None Help from another person to put on and taking off regular lower body clothing?: None 6 Click Score: 24    End of Session    OT Visit Diagnosis: Other (comment) (decreased activity tolerance)   Activity Tolerance Patient tolerated treatment well   Patient Left in bed;with call bell/phone within reach   Nurse Communication          Time: 8469-6295 OT Time Calculation (min): 24 min  Charges: OT General Charges $OT Visit: 1 Visit OT Treatments $Self Care/Home Management : 23-37 mins  Berna Spare, OTR/L Acute Rehabilitation Services Office: [phone]   Evern Bio 02/27/2023, 12:28 PM

## 2023-02-27 NOTE — Progress Notes (Signed)
HOSPITALIST ROUNDING NOTE Kaitlin Cantrell [id number]  DOB: [birth year]  DOA: 02/20/2023  PCP: [name], MD  02/27/2023,2:27 PM   LOS: 6 days      Code Status: Full From: Home  current Dispo: None    67 year old white female COPD continued tobacco 1 pack/day  HTN DM TY 2 atrial flutter/Xarelto CHADVASC >3 , HF recovered EF COVID infection 2021 PE 2017 CKD 3B Recent hospitalization 01/29/2023 sepsis secondary to COVID with unresponsiveness-was hypoglycemic at that time and improved and was discharged home  RTC 01/2244-day SOB green sputum + wheeze + oxygen requirement  [Albuterol allergy]? BUN/creatinine 20/1.4, potassium 3.8, BNP 179 troponin flat White count 10.5 hemoglobin 9.2 platelet 339 CXR = cardiomegaly distended pulmonary vasculature interstitial bilateral prominent airspace disease at lung bases possible edema  She was initally rx for PNa but then we realized she was vol ovelroaded and was diuresed--we await stability of kidney fucntion as well as her reaching dry weight to ber able to d/c her  Plan Multifactorial dyspnea--Recurrent pneumonia + recent COVID:--acute HFpEF as well as restrictive habitus Needs OP epworth, sleep study? Narrowed azithromycin +ceftriaxone-->Levaquin to complete 5 days 10/25-see below  Acute decompnesated HfpEF--some volume overload wght down to 117, -2.3 L--Last hospital stay 104 Kg--home dose lasix 40 started po.  Home in am if all stable  Uncontrolled htn Clonidine 0.1 for BP>160--added hydrochlorothiazide 12.5--cont lowered amlodipine 2.5 and torpol xl 25 qd  Continued chronic smoker with COPD at baseline with albuterol allergy hyperGlycemia will resolve --steroids off now Allergy to albuterol so may use Xopenex  Submassive PE in 2017 Some NSVT 10/23-- mainly is sinus tach on monitors---Atrial flutter CHADVASC >3 on Xarelto Continues on Xarelto This admission increase metoprolol to 25 daily XL   DM TY 2 A1c 7.7 on 12/2022 with  hyperglycemia this hospitalization from steroids Continues on Amaryl 4 mg twice daily meals, moderate sliding scale- cont 15 levemir bid  continue gabapentin 200 twice daily  LLE edema Reports prior DVT--cannot find evidence--already on DOAC Compression hose if able  CKD 3  Hyperkalemia again--stop high dose lasix-seems to have resolved some Entresto held-May resume as outpatient  DVT prophylaxis: Xarelto  Status is: Inpatient Remains inpatient appropriate because:  Nearing d/c likely can d/c home am    Subjective:  Fair--no cp fever chils n/v--ambulatory   Objective + exam Vitals:   02/27/23 0300 02/27/23 0741 02/27/23 0855 02/27/23 1130  BP: 136/88  (!) 158/72 (!) 164/83  Pulse: 96  (!) 106 (!) 101  Resp: 20  (P) 20 16  Temp: 98.4 F (36.9 C)  98.5 F (36.9 C) 98.6 F (37 C)  TempSrc: Oral  Oral Oral  SpO2: 95%  92% 95%  Weight:  116.7 kg    Height:       Filed Weights   02/25/23 0500 02/26/23 0836 02/27/23 0741  Weight: 117.3 kg 117.1 kg 116.7 kg    Examination:  Pleasant coherent in NAd Grd 1 LE edema , L>R Thick neck Chest clear Abdomen soft obese ROM intact pleasant affect   Data Reviewed: reviewed   CBC    Component Value Date/Time   WBC 15.5 (H) 02/22/2023 0456   RBC 3.84 (L) 02/22/2023 0456   HGB 9.4 (L) 02/22/2023 0456   HGB 10.7 (L) 01/10/2023 1138   HCT 32.5 (L) 02/22/2023 0456   HCT 35.8 01/10/2023 1138   PLT 370 02/22/2023 0456   PLT 362 01/10/2023 1138   MCV 84.6 02/22/2023 0456   MCV 86  01/10/2023 1138   MCH 24.5 (L) 02/22/2023 0456   MCHC 28.9 (L) 02/22/2023 0456   RDW 16.3 (H) 02/22/2023 0456   RDW 14.2 01/10/2023 1138   LYMPHSABS 1.0 02/22/2023 0456   LYMPHSABS 1.5 01/10/2023 1138   MONOABS 1.3 (H) 02/22/2023 0456   EOSABS 0.0 02/22/2023 0456   EOSABS 0.3 01/10/2023 1138   BASOSABS 0.0 02/22/2023 0456   BASOSABS 0.0 01/10/2023 1138      Latest Ref Rng & Units 02/27/2023   10:25 AM 02/26/2023    6:21 AM 02/25/2023     4:18 AM  CMP  Glucose 70 - 99 mg/dL 409  811  914   BUN 8 - 23 mg/dL 33  37  40   Creatinine 0.44 - 1.00 mg/dL 7.82  9.56  2.13   Sodium 135 - 145 mmol/L 135  137  139   Potassium 3.5 - 5.1 mmol/L 4.5  4.6  4.7   Chloride 98 - 111 mmol/L 94  95  95   CO2 22 - 32 mmol/L 30  29  34   Calcium 8.9 - 10.3 mg/dL 9.0  9.0  9.3      Scheduled Meds:  amLODipine  5 mg Oral Daily   atorvastatin  80 mg Oral Daily   ferrous sulfate  325 mg Oral Daily   furosemide  40 mg Oral Daily   gabapentin  200 mg Oral BID   glimepiride  4 mg Oral BID WC   hydrochlorothiazide  12.5 mg Oral Daily   insulin aspart  0-15 Units Subcutaneous TID WC   insulin aspart  0-5 Units Subcutaneous QHS   insulin glargine-yfgn  15 Units Subcutaneous BID   metoprolol  25 mg Oral Daily   pantoprazole  40 mg Oral Daily   rivaroxaban  20 mg Oral Daily   Continuous Infusions:    Time 25  Rhetta Mura, MD  [facility]

## 2023-02-27 NOTE — Discharge Instructions (Signed)

## 2023-02-27 NOTE — Plan of Care (Signed)

## 2023-02-27 NOTE — Progress Notes (Signed)
Mobility Specialist Progress Note:   02/27/23 0951  Mobility  Activity Ambulated with assistance in hallway  Level of Assistance Contact guard assist, steadying assist  Assistive Device None  Distance Ambulated (ft) 75 ft  Activity Response Tolerated well  Mobility Referral Yes  $Mobility charge 1 Mobility  Mobility Specialist Start Time (ACUTE ONLY) 0930  Mobility Specialist Stop Time (ACUTE ONLY) 0942  Mobility Specialist Time Calculation (min) (ACUTE ONLY) 12 min   Pt received sitting EOB, agreeable to mobility session. Ambulated in hallway with CGA and ModI using hand rails. Took 2 standing rest breaks during session d/t fatigue and SOB. SpO2 83% on RA  half way through session, practiced pursed lip breathing, recovered, SpO2 90% on RA. Took another standing rest break upon arrival to room, SpO2 86% on RA, recovered, SpO2 90% on RA. Tolerated session well, c/o back pain and stated "legs felt like rocks" d/t swelling. Returned pt to room, sitting comfortably in chair, call bell in reach, all needs met.    Pre Mobility: SpO2 91% on RA, 106 bpm During Mobility: SpO2 83-90% on RA, 124 bpm Post Mobility: SpO2 92% on RA, 110 bpm  Feliciana Rossetti Mobility Specialist Please contact via Special educational needs teacher or  Rehab office at [phone]   Feliciana Rossetti Mobility Specialist Please contact via SecureChat or  Rehab office at [phone]

## 2023-02-28 ENCOUNTER — Telehealth (HOSPITAL_COMMUNITY): Payer: Self-pay | Admitting: Pharmacy Technician

## 2023-02-28 ENCOUNTER — Telehealth: Payer: Self-pay | Admitting: *Deleted

## 2023-02-28 DIAGNOSIS — J9601 Acute respiratory failure with hypoxia: Secondary | ICD-10-CM | POA: Diagnosis not present

## 2023-02-28 LAB — BASIC METABOLIC PANEL
Anion gap: 9 (ref 5–15)
BUN: 32 mg/dL — ABNORMAL HIGH (ref 8–23)
CO2: 32 mmol/L (ref 22–32)
Calcium: 9.4 mg/dL (ref 8.9–10.3)
Chloride: 95 mmol/L — ABNORMAL LOW (ref 98–111)
Creatinine, Ser: 1.46 mg/dL — ABNORMAL HIGH (ref 0.44–1.00)
GFR, Estimated: 40 mL/min — ABNORMAL LOW (ref >60)
Glucose, Bld: 177 mg/dL — ABNORMAL HIGH (ref 70–99)
Potassium: 4.2 mmol/L (ref 3.5–5.1)
Sodium: 136 mmol/L (ref 135–145)

## 2023-02-28 LAB — GLUCOSE, CAPILLARY: Glucose-Capillary: 171 mg/dL — ABNORMAL HIGH (ref 70–99)

## 2023-02-28 MED ORDER — METOPROLOL SUCCINATE ER 200 MG PO TB24: 100.0000 mg | ORAL_TABLET | Freq: Every day | ORAL | 3 refills | Status: DC

## 2023-02-28 MED ORDER — INSULIN GLARGINE-YFGN 100 UNIT/ML ~~LOC~~ SOLN: 15.0000 [IU] | Freq: Two times a day (BID) | SUBCUTANEOUS | 11 refills | Status: DC

## 2023-02-28 MED ORDER — GABAPENTIN 100 MG PO CAPS: 200.0000 mg | ORAL_CAPSULE | Freq: Two times a day (BID) | ORAL | Status: DC

## 2023-02-28 NOTE — Telephone Encounter (Signed)
Pt calling to say that she just got out of hospital and that they changed her medications while there.  She said that they sent in the [facility] in the vial and she wanted to know if you could send in the one that is in a pen.  She also said that it would require a PA.  Patient was already scheduled with PCP as a f/u on 03/07/23 and I adjusted this to be a 40 min appointment to do her hospital f/u.  Will check with provider to be sure that 11/5 is ok or do we need to try and get her in sooner? Please advise.

## 2023-02-28 NOTE — Plan of Care (Signed)

## 2023-02-28 NOTE — Discharge Summary (Addendum)
Physician Discharge Summary  Kaitlin Cantrell MWU:[id number] DOB: Nov 29, 1955 DOA: 02/20/2023  PCP: Kaitlin Kitty, MD  Admit date: 02/20/2023 Discharge date: 02/28/2023  Time spent: 44 minutes  Recommendations for Outpatient Follow-up:  Requires Chem-12 CBC in about 1 week's time Recommend referral to outpatient pulmonology secondary to need for outpatient screening as it is a smoker--may also need either an Epworth, STOP-BANG test or split-night study as per PCP Recommend careful fluid balance-had to hold various meds this hospital stay because of hyperkalemia inclusive of Entresto which was resumed at discharge Recommend outpatient cardiology follow-up as indicated  Discharge Diagnoses:  MAIN problem for hospitalization   Acute hypoxic respiratory failure potentially secondary to pneumonia and also heart failure both combined Previous PE in 2017 atrial flutter on Xarelto HF R EF previously followed by advanced heart failure team DM TY 2 Morbid obesity with lower extremity swelling which is chronic CKD 3B  Please see below for itemized issues addressed in HOpsital- refer to other progress notes for clarity if needed  Discharge Condition: Improved  Diet recommendation: Diabetic heart healthy  Filed Weights   02/26/23 0836 02/27/23 0741 02/28/23 0514  Weight: 117.1 kg 116.7 kg 113.4 kg    History of present illness:  67-year-old white female COPD continued tobacco 1 pack/day  HTN DM TY 2 atrial flutter/Xarelto CHADVASC >3 , HF recovered EF COVID infection 2021 PE 2017 CKD 3B Recent hospitalization 01/29/2023 sepsis secondary to COVID with unresponsiveness-was hypoglycemic at that time and improved and was discharged home   RTC 01/2244-day SOB green sputum + wheeze + oxygen requirement  [Albuterol allergy]? BUN/creatinine 20/1.4, potassium 3.8, BNP 179 troponin flat White count 10.5 hemoglobin 9.2 platelet 339 CXR = cardiomegaly distended pulmonary vasculature  interstitial bilateral prominent airspace disease at lung bases possible edema   She was initally rx for PNa but then we realized she was vol ovelroaded and was diuresed--  Hospital Course:  Plan Multifactorial dyspnea--Recurrent pneumonia + recent COVID:--acute HFpEF as well as restrictive habitus Needs OP epworth, sleep study? Narrowed azithromycin +ceftriaxone-->Levaquin to complete 5 days 10/25-see below   Acute decompnesated HfpEF--some volume overload wght down to 113, -2.3 L--Last hospital stay 104 Kg--home dose lasix 40 started po.   We did adjust her diuretics as below she will need close follow-up in the outpatient setting for fluid balance she knows how to weigh herself and has been instructed to do so and take extra Lasix on days that she gains more fluid salt restriction has been advised   Uncontrolled htn At home it looks like she was actually on very high dose of Toprol-here she was on variable meds and I resumed her metoprolol at 100 XL daily in addition to resuming her Entresto at discharge This was transiently held because the patient had hyperkalemia We will need to see what her blood pressure trends in the future as an outpatient and may require heart failure meds additionally   Continued chronic smoker with COPD at baseline with albuterol allergy hyperGlycemia will resolve --steroids off now Allergy to albuterol so may use Xopenex   Submassive PE in 2017 Some NSVT 10/23-- mainly is sinus tach on monitors---Atrial flutter CHADVASC >3 on Xarelto Continues on Xarelto    DM TY 2 A1c 7.7 on 12/2022 with hyperglycemia this hospitalization from steroids Continues on Amaryl 4 mg twice daily meals, moderate sliding scale- cont 15 levemir bid  continue gabapentin 200 twice daily   LLE edema Reports prior DVT--cannot find evidence--already on DOAC Compression hose if  able   CKD 3  Hyperkalemia resolved-stop high dose lasix-seems to have resolved some Entresto held  initially but we have resumed--will need labs 1 week     Discharge Exam: Vitals:   02/27/23 1947 02/28/23 0715  BP: (!) 143/81 (!) 162/88  Pulse: (!) 106 100  Resp: 18 17  Temp: 98.3 F (36.8 C) 98.1 F (36.7 C)  SpO2: 99% 97%    Subj on day of d/c   Eomi no issue  General Exam on discharge  Eomi cat b  No wheeze rales rhonchi S1 s 2no m/r/g Abd soft nt nd no rebound  Discharge Instructions   Discharge Instructions     Ambulatory Referral for Lung Cancer Scre   Complete by: As directed    Diet - low sodium heart healthy   Complete by: As directed    Discharge instructions   Complete by: As directed    Make sure you take your medications as indicated-note that there are dosage changes to your metoprolol and also changes to the way you take your Lantus or long-acting insulin in addition we have discontinued several meds including your cyclobenzaprine and your metformin and you should follow-up in about 1 week's time to get labs done to see what your kidney function is like in the outpatient setting because you will require various heart failure medications It may be worthwhile to follow-up with Dr. Shirlee Latch of cardiology in the outpatient setting for further routine management of your heart failure Please check your sugars as per your usual home protocol Please also check your weight daily and if you gain more than 2 pounds in a 24-hour period of time you may need to take another dose of Lasix Please continue to restrict excessive salt in her diet   Increase activity slowly   Complete by: As directed       Allergies as of 02/28/2023       Reactions   Albuterol Anaphylaxis   Throat/lips swelling   Morphine And Codeine Other (See Comments)   Very aggressive and doesn't help pain        Medication List     STOP taking these medications    cyclobenzaprine 10 MG tablet Commonly known as: FLEXERIL   Lantus SoloStar 100 UNIT/ML Solostar Pen Generic drug:  insulin glargine   metFORMIN 850 MG tablet Commonly known as: GLUCOPHAGE       TAKE these medications    acetaminophen 500 MG tablet Commonly known as: TYLENOL Take 1,000 mg by mouth every 6 (six) hours as needed for moderate pain (pain score 4-6).   amLODipine 5 MG tablet Commonly known as: NORVASC Take 1 tablet (5 mg total) by mouth daily.   atorvastatin 80 MG tablet Commonly known as: LIPITOR TAKE 1 TABLET BY MOUTH ONCE DAILY AT  6PM   Atrovent HFA 17 MCG/ACT inhaler Generic drug: ipratropium Inhale 2 puffs into the lungs every 6 (six) hours as needed for wheezing.   budesonide 0.25 MG/2ML nebulizer solution Commonly known as: Pulmicort Take 4 mLs (0.5 mg total) by nebulization daily.   budesonide-formoterol 160-4.5 MCG/ACT inhaler Commonly known as: Symbicort Inhale 1 puff into the lungs 2 (two) times daily. What changed:  when to take this reasons to take this   Entresto 97-103 MG Generic drug: sacubitril-valsartan Take 1 tablet by mouth 2 (two) times daily.   ergocalciferol 1.25 MG (50000 UT) capsule Commonly known as: Drisdol Take 1 capsule (50,000 Units total) by mouth once a week.  furosemide 40 MG tablet Commonly known as: LASIX Take 1 tablet (40 mg total) by mouth daily.   gabapentin 100 MG capsule Commonly known as: NEURONTIN Take 2 capsules (200 mg total) by mouth 2 (two) times daily. What changed:  medication strength how much to take when to take this   glimepiride 4 MG tablet Commonly known as: AMARYL Take 1 tablet (4 mg total) by mouth 2 (two) times daily.   insulin glargine-yfgn 100 UNIT/ML injection Commonly known as: SEMGLEE Inject 0.15 mLs (15 Units total) into the skin 2 (two) times daily.   ipratropium 0.02 % nebulizer solution Commonly known as: ATROVENT Take 2.5 mLs (0.5 mg total) by nebulization every 6 (six) hours as needed for wheezing or shortness of breath. What changed: when to take this   Iron (Ferrous Sulfate) 325  (65 Fe) MG Tabs Take 325 mg by mouth daily.   Krill Oil Caps Take 1 capsule by mouth daily.   metoprolol 200 MG 24 hr tablet Commonly known as: TOPROL-XL Take 0.5 tablets (100 mg total) by mouth daily. What changed: how much to take   omeprazole 20 MG capsule Commonly known as: PRILOSEC Take 20 mg by mouth 2 (two) times daily before a meal.   rivaroxaban 20 MG Tabs tablet Commonly known as: Xarelto Take 1 tablet (20 mg total) by mouth daily.       Allergies  Allergen Reactions   Albuterol Anaphylaxis    Throat/lips swelling   Morphine And Codeine Other (See Comments)    Very aggressive and doesn't help pain      The results of significant diagnostics from this hospitalization (including imaging, microbiology, ancillary and laboratory) are listed below for reference.    Significant Diagnostic Studies: DG Chest 2 View  Result Date: 02/23/2023 CLINICAL DATA:  Shortness of breath.  Pneumonia. EXAM: CHEST - 2 VIEW COMPARISON:  Chest radiographs 02/20/2023 and chest CTA 02/21/2023 FINDINGS: The cardiomediastinal silhouette is unchanged with normal heart size. Mild bibasilar opacities have mildly worsened on the right and improved on the left compared to the prior radiographs. The left basilar opacities may have also improved from the interval CT. No pleural effusion or pneumothorax is identified. No acute osseous abnormality is seen. IMPRESSION: Mild bibasilar infiltrates with mixed interval changes as above. Electronically Signed   By: Sebastian Ache M.D.   On: 02/23/2023 16:23   CT Angio Chest PE W/Cm &/Or Wo Cm  Result Date: 02/21/2023 CLINICAL DATA:  Pulmonary embolus suspected with low to intermediate probability. Positive D-dimer. EXAM: CT ANGIOGRAPHY CHEST WITH CONTRAST TECHNIQUE: Multidetector CT imaging of the chest was performed using the standard protocol during bolus administration of intravenous contrast. Multiplanar CT image reconstructions and MIPs were obtained to  evaluate the vascular anatomy. RADIATION DOSE REDUCTION: This exam was performed according to the departmental dose-optimization program which includes automated exposure control, adjustment of the mA and/or kV according to patient size and/or use of iterative reconstruction technique. CONTRAST:  60mL OMNIPAQUE IOHEXOL 350 MG/ML SOLN COMPARISON:  02/28/2022 FINDINGS: Cardiovascular: Evaluation of the lower lobe pulmonary arteries is limited due to motion artifact. The visualized central and proximal segmental pulmonary arteries appear patent without evidence of significant pulmonary embolus. Normal heart size. No pericardial effusions. Normal caliber thoracic aorta. Calcification of the aorta and coronary arteries. Mediastinum/Nodes: Small esophageal hiatal hernia. Esophagus is decompressed. Mediastinal lymph nodes are not pathologically enlarged, likely reactive. No change since prior study. Thyroid gland is unremarkable. Lungs/Pleura: Patchy airspace infiltrates throughout both lungs likely representing  multifocal pneumonia. No pleural effusions. No pneumothorax. Mucous plugging demonstrated in the lower lobe bronchi. Upper Abdomen: No acute abnormalities. Musculoskeletal: No chest wall abnormality. No acute or significant osseous findings. Review of the MIP images confirms the above findings. IMPRESSION: 1. No evidence of significant pulmonary embolus. Artifact in the lower lobe pulmonary arteries due to motion. 2. Patchy airspace infiltrates throughout both lungs likely representing multifocal pneumonia. No pleural effusions. Electronically Signed   By: Burman Nieves M.D.   On: 02/21/2023 03:49   DG Chest 2 View  Result Date: 02/20/2023 CLINICAL DATA:  Shortness of breath, COPD, and CHF. Congested, nonproductive cough. EXAM: CHEST - 2 VIEW COMPARISON:  01/02/2023. FINDINGS: The heart is enlarged and mediastinal contour are within normal limits. Atherosclerotic calcification of the aorta is noted. The  pulmonary vasculature is mildly distended. Increased AP diameter of the chest is noted with flattening of the diaphragms, compatible with COPD. Interstitial prominence is present bilaterally. There is mild patchy airspace disease at the lung bases. No effusion or pneumothorax. No acute osseous abnormality. IMPRESSION: 1. Cardiomegaly with distended pulmonary vasculature. 2. Interstitial prominence bilaterally with mild airspace disease at the lung bases, possible edema or infiltrate. 3. Chronic obstructive pulmonary disease. Electronically Signed   By: Thornell Sartorius M.D.   On: 02/20/2023 23:14    Microbiology: No results found for this or any previous visit (from the past 240 hour(s)).   Labs: Basic Metabolic Panel: Recent Labs  Lab 02/22/23 1840 02/23/23 6644 02/24/23 0946 02/25/23 0418 02/26/23 0621 02/27/23 1025 02/28/23 0548  NA 135   < > 138 139 137 135 136  K 4.6   < > 5.3* 4.7 4.6 4.5 4.2  CL 95*   < > 95* 95* 95* 94* 95*  CO2 26   < > 31 34* 29 30 32  GLUCOSE 290*   < > 300* 200* 196* 275* 177*  BUN 27*   < > 35* 40* 37* 33* 32*  CREATININE 1.47*   < > 1.45* 1.36* 1.68* 1.47* 1.46*  CALCIUM 9.4   < > 9.5 9.3 9.0 9.0 9.4  MG 1.9  --   --  1.9  --   --   --    < > = values in this interval not displayed.   Liver Function Tests: No results for input(s): "AST", "ALT", "ALKPHOS", "BILITOT", "PROT", "ALBUMIN" in the last 168 hours. No results for input(s): "LIPASE", "AMYLASE" in the last 168 hours. No results for input(s): "AMMONIA" in the last 168 hours. CBC: Recent Labs  Lab 02/22/23 0456  WBC 15.5*  NEUTROABS 13.0*  HGB 9.4*  HCT 32.5*  MCV 84.6  PLT 370   Cardiac Enzymes: No results for input(s): "CKTOTAL", "CKMB", "CKMBINDEX", "TROPONINI" in the last 168 hours. BNP: BNP (last 3 results) Recent Labs    01/03/23 0447 02/21/23 0109  BNP 1,021.6* 179.8*    ProBNP (last 3 results) No results for input(s): "PROBNP" in the last 8760 hours.  CBG: Recent Labs   Lab 02/27/23 0748 02/27/23 1253 02/27/23 1604 02/27/23 1945 02/28/23 0716  GLUCAP 213* 228* 253* 178* 171*       Signed:  Rhetta Mura MD   [facility] 02/28/2023, 9:23 AM

## 2023-02-28 NOTE — Care Management Important Message (Signed)
Important Message  Patient Details  Name: Kaitlin Cantrell MRN: [id number] Date of Birth: 21-Jan-1956   Important Message Given:  Yes - Medicare IM Patient left prior to IM delivery will mail to the patient home address.     Sidi Dzikowski 02/28/2023, 12:39 PM

## 2023-02-28 NOTE — Telephone Encounter (Signed)
Pharmacy Patient Advocate Encounter   Received notification from Fax that prior authorization for Semglee (yfgn) 100UNIT/ML solution is required/requested.   Insurance verification completed.   The patient is insured through [facility] ADVANTAGE/RX ADVANCE .   Per test claim: PA required; PA submitted to above mentioned insurance via CoverMyMeds Key/confirmation #/EOC [id number] Status is pending

## 2023-02-28 NOTE — Progress Notes (Signed)
Mobility Specialist Progress Note:    02/28/23 0830  Mobility  Activity Ambulated with assistance in hallway  Level of Assistance Contact guard assist, steadying assist  Assistive Device None  Distance Ambulated (ft) 75 ft  Activity Response Tolerated well  Mobility Referral Yes  $Mobility charge 1 Mobility  Mobility Specialist Start Time (ACUTE ONLY) 0830  Mobility Specialist Stop Time (ACUTE ONLY) 0839  Mobility Specialist Time Calculation (min) (ACUTE ONLY) 9 min   Pt received sitting EOB, agreeable to mobility session. Ambulated in hallway with CGA for safety. Pt c/o back pain. Took 1 standing rest break, SpO2 85% on RA, practiced pursed lip breathing, recovered, SpO2 90% on RA. Returned to room, sitting on EOB. Left pt with all needs met.   Pre Mobility: SpO2 94% on RA, 117 bpm (HR) During Session: See O2 above, 135 bpm (HR) Post Session: SpO2 94% on RA, 124 bpm (HR)  Feliciana Rossetti Mobility Specialist Please contact via Special educational needs teacher or  Rehab office at [phone]

## 2023-02-28 NOTE — Consult Note (Signed)
[facility]  Consult   02/28/2023  [name] [birth year] [id number]  Value-Based Care Institute [VBCI]   Primary Care Provider: [name], MD is [facility], this provider is listed for the transition of care follow up appointments and calls   [facility] Liaison screened for post hospital care coordination needs.    The patient was screened for  7-day LOS hospitalization with noted high risk score for unplanned readmission risk 2 hospital admissions in 6 months.  The patient was assessed for potential Community Care Coordination service needs for post hospital transition for care coordination. Review of patient's electronic medical record reveals patient is bilateral pneumonia.  Plan:  Referral request for community care coordination: Reviewed and anticipate community TOC follow up calls for needs.   VBCI Community Care Management/Population Health does not replace or interfere with any arrangements made by the Inpatient Transition of Care team.   For questions contact:   Charlesetta Shanks, RN, BSN, CCM [place]  [facility], [facility] Liaison Direct Dial: [phone] or secure chat Website: [email or web address]

## 2023-03-01 ENCOUNTER — Other Ambulatory Visit (HOSPITAL_COMMUNITY): Payer: Self-pay

## 2023-03-01 MED ORDER — INSULIN GLARGINE-YFGN 100 UNIT/ML ~~LOC~~ SOPN: 15.0000 [IU] | PEN_INJECTOR | Freq: Two times a day (BID) | SUBCUTANEOUS | 6 refills | Status: DC

## 2023-03-01 NOTE — Telephone Encounter (Signed)
Pharmacy Patient Advocate Encounter  Received notification from [facility] ADVANTAGE/RX ADVANCE that Prior Authorization for Semglee (yfgn) 100UNIT/ML solution  has been APPROVED from 02/28/2023 to 05/01/2024   PA #/Case ID/Reference #: [id number]

## 2023-03-01 NOTE — Telephone Encounter (Signed)
Prescription for pens was sent into the [facility].  She can continue to see me on 11/5

## 2023-03-01 NOTE — Addendum Note (Signed)
Addended by: [name] on: 03/01/2023 07:40 AM   Modules accepted: Orders

## 2023-03-07 ENCOUNTER — Ambulatory Visit (INDEPENDENT_AMBULATORY_CARE_PROVIDER_SITE_OTHER): Payer: PPO | Admitting: Family Medicine

## 2023-03-07 ENCOUNTER — Encounter: Payer: Self-pay | Admitting: Family Medicine

## 2023-03-07 VITALS — BP 110/67 | HR 84 | Ht 66.0 in | Wt 252.4 lb

## 2023-03-07 DIAGNOSIS — J189 Pneumonia, unspecified organism: Secondary | ICD-10-CM

## 2023-03-07 DIAGNOSIS — N183 Chronic kidney disease, stage 3 unspecified: Secondary | ICD-10-CM | POA: Diagnosis not present

## 2023-03-07 DIAGNOSIS — E785 Hyperlipidemia, unspecified: Secondary | ICD-10-CM

## 2023-03-07 DIAGNOSIS — E119 Type 2 diabetes mellitus without complications: Secondary | ICD-10-CM | POA: Diagnosis not present

## 2023-03-07 DIAGNOSIS — E1159 Type 2 diabetes mellitus with other circulatory complications: Secondary | ICD-10-CM | POA: Diagnosis not present

## 2023-03-07 DIAGNOSIS — Z794 Long term (current) use of insulin: Secondary | ICD-10-CM

## 2023-03-07 DIAGNOSIS — I5041 Acute combined systolic (congestive) and diastolic (congestive) heart failure: Secondary | ICD-10-CM

## 2023-03-07 DIAGNOSIS — E1169 Type 2 diabetes mellitus with other specified complication: Secondary | ICD-10-CM

## 2023-03-07 DIAGNOSIS — I152 Hypertension secondary to endocrine disorders: Secondary | ICD-10-CM | POA: Diagnosis not present

## 2023-03-07 DIAGNOSIS — G894 Chronic pain syndrome: Secondary | ICD-10-CM | POA: Diagnosis not present

## 2023-03-07 DIAGNOSIS — I480 Paroxysmal atrial fibrillation: Secondary | ICD-10-CM | POA: Diagnosis not present

## 2023-03-07 DIAGNOSIS — J439 Emphysema, unspecified: Secondary | ICD-10-CM | POA: Diagnosis not present

## 2023-03-07 MED ORDER — MEDICAL COMPRESSION STOCKINGS MISC: 1 refills | Status: AC

## 2023-03-07 MED ORDER — TIZANIDINE HCL 4 MG PO TABS: 4.0000 mg | ORAL_TABLET | Freq: Three times a day (TID) | ORAL | 1 refills | Status: DC | PRN

## 2023-03-07 MED ORDER — PREGABALIN 75 MG PO CAPS: 75.0000 mg | ORAL_CAPSULE | Freq: Two times a day (BID) | ORAL | 1 refills | Status: DC

## 2023-03-07 NOTE — Patient Instructions (Addendum)
It was nice to see you today,  We addressed the following topics today: -I have changed your gabapentin to Lyrica. you Will take it twice a day. - I have added tizanidine instead of your Flexeril.  Take this at night to start with. - Call Dr. Alford Highland office to see if they need to schedule an appointment for you. - We will let you know the results of your lab test we will get them - I have sent in an order for a compression stocking that you can take to the medical supply company to see if it is covered by your insurance.  I have put the size and compression rating on the order.  Have a great day,  Frederic Jericho, MD

## 2023-03-07 NOTE — Assessment & Plan Note (Signed)
Continue statin. 

## 2023-03-07 NOTE — Progress Notes (Unsigned)
   Established Patient Office Visit  Subjective   Patient ID: Kaitlin Cantrell, female    DOB: Jul 15, 1955  Age: 67 y.o. MRN: [id number]  No chief complaint on file.   HPI  HFrEF-patient taking Entresto, metoprolol, furosemide.  DM2-patient has been using her previous Lantus.  She has not been able to get the [facility] due to insurance coverage issues.  Discussed continuing to use the Lantus.  Patient not taking metformin due to GFR being too low.  HLD-patient taking Lipitor.  Pain-patient was told to stop taking her Flexeril and her gabapentin was decreased to 200 mg twice a day.  She r was previously taking 300 mg 3 times a day.  This is for patient's chronic back pain which she has personally had surgery on twice.  Generally with a dull baseline pain with episodes of sharp pain.  We discussed alternatives including switching to Lyrica and trying a different muscle relaxant more appropriate for her age.  Patient agreeable to this.  Patient is not interested in seeing a nephrologist at this time.  She will follow-up with pulmonology to schedule an appointment with them.  The 10-year ASCVD risk score (Arnett DK, et al., 2019) is: 37%  Health Maintenance Due  Topic Date Due   COVID-19 Vaccine (1) Never done   Cervical Cancer Screening (HPV/Pap Cotest)  03/10/2014   OPHTHALMOLOGY EXAM  03/30/2017   Medicare Annual Wellness (AWV)  12/28/2017   INFLUENZA VACCINE  12/01/2022   DEXA SCAN  Never done      Objective:     There were no vitals taken for this visit. {Vitals History (Optional):23777}  Physical Exam General: Alert, oriented CV: Regular rate and rhythm Pulmonary: Decreased air movement bilaterally but no wheezes or crackles. Extremities: Left leg with mild edema.   No results found for any visits on 03/07/23.      Assessment & Plan:   There are no diagnoses linked to this encounter.   No follow-ups on file.    [name], MD

## 2023-03-08 LAB — BASIC METABOLIC PANEL
BUN/Creatinine Ratio: 14 (ref 12–28)
BUN: 19 mg/dL (ref 8–27)
CO2: 23 mmol/L (ref 20–29)
Calcium: 9.6 mg/dL (ref 8.7–10.3)
Chloride: 96 mmol/L (ref 96–106)
Creatinine, Ser: 1.37 mg/dL — ABNORMAL HIGH (ref 0.57–1.00)
Glucose: 206 mg/dL — ABNORMAL HIGH (ref 70–99)
Potassium: 4.9 mmol/L (ref 3.5–5.2)
Sodium: 137 mmol/L (ref 134–144)
eGFR: 43 mL/min/{1.73_m2} — ABNORMAL LOW (ref >59)

## 2023-03-08 LAB — CBC
Hematocrit: 38.5 % (ref 34.0–46.6)
Hemoglobin: 11.4 g/dL (ref 11.1–15.9)
MCH: 24.8 pg — ABNORMAL LOW (ref 26.6–33.0)
MCHC: 29.6 g/dL — ABNORMAL LOW (ref 31.5–35.7)
MCV: 84 fL (ref 79–97)
Platelets: 455 10*3/uL — ABNORMAL HIGH (ref 150–450)
RBC: 4.6 x10E6/uL (ref 3.77–5.28)
RDW: 15.7 % — ABNORMAL HIGH (ref 11.7–15.4)
WBC: 12 10*3/uL — ABNORMAL HIGH (ref 3.4–10.8)

## 2023-03-08 LAB — MAGNESIUM: Magnesium: 2.3 mg/dL (ref 1.6–2.3)

## 2023-03-08 MED ORDER — INSULIN GLARGINE 100 UNITS/ML SOLOSTAR PEN: 15.0000 [IU] | PEN_INJECTOR | Freq: Two times a day (BID) | SUBCUTANEOUS | 11 refills | Status: DC

## 2023-03-08 NOTE — Assessment & Plan Note (Signed)
No wheezing on exam.  Encouraged patient to use her Symbicort twice a day.  Patient typically uses it more sporadically.  Patient also with ipratropium nebulizer she uses once daily.  Has severe allergy to albuterol.  She may benefit from a Trelegy inhaler.  Will need to discuss in the future.  Patient agreeable to scheduling an appointment with a pulmonologist.  Encourage patient to continue with her low-dose lung cancer screening CT since the previous CT of her chest was in the setting of a significant pneumonia.

## 2023-03-08 NOTE — Assessment & Plan Note (Signed)
On Xarelto.  Metoprolol was cut from 200 mg to 100 mg in the hospital.  Rate is controlled today.  Continue with 100 mg.  Recommended patient reach out to her cardiologist regarding future follow-ups.

## 2023-03-08 NOTE — Assessment & Plan Note (Signed)
Gabapentin switched from 300 3 times daily to 200 twice daily during her hospitalization.  Patient's longtime Flexeril was discontinued.  Patient's pain has increased since that time.  Discussed switching from gabapentin to Lyrica at a renal appropriate dose to see if this improves response.  Discussed switching the Flexeril to a more age appropriate muscle relaxant like tizanidine.  Patient agreeable to this.

## 2023-03-08 NOTE — Assessment & Plan Note (Signed)
Patient status post hospitalization for multifocal pneumonia.  Respiratory status is improved on exam.  On room air.

## 2023-03-08 NOTE — Assessment & Plan Note (Signed)
Patient's Lantus was switched to [facility] during hospitalization.  She has had issues getting the Semglee covered by insurance.  I do not see any significant reason for needing to be on Semglee over Lantus, so we will just continue with her Lantus.  Will send in new refills. - Continue to hold metformin due to kidney disease. - Also taking glimepiride.  Discussed stopping this at next visit given that she is on insulin and GFR is low - Other options include SGLT2's, I have tried to prescribe Jardiance in the past but patient did not pick it up as the cost was too much. - Will send in case management referral for help with affording this medication.

## 2023-03-08 NOTE — Assessment & Plan Note (Signed)
Taking Entresto, metoprolol, furosemide.  Metoprolol dose was decreased from 200 to 100 mg.  She sees Dr. Sherlie Ban for routine follow-ups.  Advised her to reach out to his office to see if she needs to schedule a new follow-up.  Last appointment was roughly around 10 months ago.  Continue current medications.

## 2023-03-08 NOTE — Assessment & Plan Note (Signed)
Patient declines nephrology referral.  Discussed her CKD and how it affects the medication she takes. - Patient would benefit from SGLT2 for her CKD regardless of her diabetes status. - Continue to hold metformin.

## 2023-03-08 NOTE — Assessment & Plan Note (Signed)
Blood pressure controlled.  Continue Entresto, amlodipine, metoprolol

## 2023-03-09 ENCOUNTER — Telehealth: Payer: Self-pay

## 2023-03-09 NOTE — Progress Notes (Signed)
   Care Guide Note  03/09/2023 Name: Kaitlin Cantrell MRN: [id number] DOB: 06-13-1955  Referred by: [name], MD Reason for referral : Care Coordination (Outreach to schedule with Pharm d )   Kaitlin Cantrell is a 67 y.o. year old female who is a primary care patient of [name], MD. Kaitlin Cantrell was referred to the pharmacist for assistance related to DM.    Successful contact was made with the patient to discuss pharmacy services. Patient declines engagement at this time. Contact information was provided to the patient should they wish to reach out for assistance at a later time.  Penne Lash, RMA Care Guide [facility]  [place], [place] [address] Direct Dial: [phone] [email or web address]

## 2023-03-09 NOTE — Progress Notes (Signed)
   Care Guide Note  03/09/2023 Name: Kaitlin Cantrell MRN: [id number] DOB: [birth year]  Referred by: [name], MD Reason for referral : Care Coordination (Outreach to schedule with Pharm d )   Kaitlin Cantrell is a 67 y.o. year old female who is a primary care patient of [name], MD. Kaitlin Cantrell was referred to the pharmacist for assistance related to DM.    An unsuccessful telephone outreach was attempted today to contact the patient who was referred to the pharmacy team for assistance with medication assistance. Additional attempts will be made to contact the patient.   Penne Lash, RMA Care Guide [facility]  [place], [place] [address] Direct Dial: [phone] [email or web address]

## 2023-03-14 ENCOUNTER — Other Ambulatory Visit: Payer: Self-pay | Admitting: Family Medicine

## 2023-03-14 MED ORDER — FREESTYLE LIBRE 3 PLUS SENSOR MISC: 5 refills | Status: DC

## 2023-04-04 ENCOUNTER — Ambulatory Visit (INDEPENDENT_AMBULATORY_CARE_PROVIDER_SITE_OTHER): Payer: PPO | Admitting: Family Medicine

## 2023-04-04 ENCOUNTER — Encounter: Payer: Self-pay | Admitting: Family Medicine

## 2023-04-04 VITALS — BP 135/85 | HR 86 | Ht 66.0 in | Wt 255.0 lb

## 2023-04-04 DIAGNOSIS — J439 Emphysema, unspecified: Secondary | ICD-10-CM

## 2023-04-04 DIAGNOSIS — I5041 Acute combined systolic (congestive) and diastolic (congestive) heart failure: Secondary | ICD-10-CM

## 2023-04-04 DIAGNOSIS — G894 Chronic pain syndrome: Secondary | ICD-10-CM | POA: Diagnosis not present

## 2023-04-04 DIAGNOSIS — E119 Type 2 diabetes mellitus without complications: Secondary | ICD-10-CM

## 2023-04-04 DIAGNOSIS — Z794 Long term (current) use of insulin: Secondary | ICD-10-CM

## 2023-04-04 MED ORDER — ATROVENT HFA 17 MCG/ACT IN AERS: 2.0000 | INHALATION_SPRAY | Freq: Four times a day (QID) | RESPIRATORY_TRACT | 11 refills | Status: AC | PRN

## 2023-04-04 NOTE — Assessment & Plan Note (Signed)
Recommended patient reach out to Dr. Alford Highland office to schedule her follow-up.  Has been roughly 1 year since her last visit.

## 2023-04-04 NOTE — Patient Instructions (Signed)
It was nice to see you today,  We addressed the following topics today: -Please try to check your blood sugar at least once in the morning fasting and again before you give yourself insulin in the evening.  When you check your fasting blood sugar in the morning it should be between 100 and 140.  If it is less than 100 decrease the dose of Lantus by 2 units. - If you have any trouble using your continuous glucose monitor let us know and we can help you put it on. - For your chronic pain I have increased your Lyrica dosing to every 8 hours or 3 times a day.  Until you get your new prescription you can take your previous Lyrica pills every 8 hours. - Please schedule a follow-up appointment with Dr. Shirlee Latch in the next 1 to 2 months.  Have a great day,  Frederic Jericho, MD

## 2023-04-04 NOTE — Assessment & Plan Note (Addendum)
Continue Lantus 15 mg twice daily.  Advised patient to check her blood sugars twice a day at least.  Encouraged her to wear the continuous glucose monitor and advised her we can help with setting it up.  I have linked her account to our [place] view account.  Check A1c in 2 months

## 2023-04-04 NOTE — Progress Notes (Addendum)
   Established Patient Office Visit  Subjective   Patient ID: Kaitlin Cantrell, female    DOB: 1956-03-06  Age: 67 y.o. MRN: [id number]  Chief Complaint  Patient presents with   Medical Management of Chronic Issues    HPI  DM2-patient has been able to get her Lantus with no issues.  Takes 15 units twice a day in the morning and before supper.  She checks her blood sugar with a glucometer occasionally.  Has had issues with her freestyle sensors and is not currently wearing 1.  We discussed checking her blood sugar at least twice a day and advised her we can help with applying the freestyle libre.  Heart failure-patient has not scheduled follow-up with Dr. Sherlie Ban.  I advised her to call their office to schedule an appointment in the next 1 to 2 months.  Chronic pain-patient feels like the Lyrica is helping but wears off between the 12 hours.  She is okay with increasing to every 8 hours.  Zanaflex helps and does not make her drowsy.  Patient is not interested in doing the Medicare annual wellness visit over the phone.  The 10-year ASCVD risk score (Arnett DK, et al., 2019) is: 27.3%  Health Maintenance Due  Topic Date Due   COVID-19 Vaccine (1) Never done   Zoster Vaccines- Shingrix (1 of 2) 12/06/1976   Cervical Cancer Screening (HPV/Pap Cotest)  03/10/2014   OPHTHALMOLOGY EXAM  03/30/2017   Medicare Annual Wellness (AWV)  12/28/2017   MAMMOGRAM  11/22/2019   INFLUENZA VACCINE  12/01/2022   DEXA SCAN  Never done      Objective:     BP 135/85   Pulse 86   Ht 5\' 6"  (1.676 m)   Wt 255 lb (115.7 kg)   SpO2 94%   BMI 41.16 kg/m    Physical Exam General: Alert, oriented.  Has odor of cigarette smoke on her. Pulmonary: No respiratory distress Psych: Pleasant affect.   No results found for any visits on 04/04/23.      Assessment & Plan:   Type 2 diabetes mellitus without complication, without long-term current use of insulin (HCC) Assessment & Plan: Continue  Lantus 15 mg twice daily.  Advised patient to check her blood sugars twice a day at least.  Encouraged her to wear the continuous glucose monitor and advised her we can help with setting it up.  I have linked her account to our [place] view account.  Check A1c in 2 months   Pulmonary emphysema, unspecified emphysema type (HCC) -     Atrovent HFA; Inhale 2 puffs into the lungs every 6 (six) hours as needed for wheezing.  Dispense: 1 each; Refill: 11  Combined systolic and diastolic congestive heart failure [facility]) Assessment & Plan: Recommended patient reach out to Dr. Alford Highland office to schedule her follow-up.  Has been roughly 1 year since her last visit.   Chronic pain syndrome Assessment & Plan: Increasing Lyrica to 75 every 8 hours from every 12 hours.  Would not go over 300 mg total daily dose.  Continue tizanidine.      Return in about 2 months (around 06/05/2023) for DM.    [name], MD

## 2023-04-04 NOTE — Assessment & Plan Note (Signed)
Increasing Lyrica to 75 every 8 hours from every 12 hours.  Would not go over 300 mg total daily dose.  Continue tizanidine.

## 2023-04-06 ENCOUNTER — Other Ambulatory Visit: Payer: Self-pay | Admitting: Nurse Practitioner

## 2023-04-06 DIAGNOSIS — R062 Wheezing: Secondary | ICD-10-CM

## 2023-04-06 NOTE — Telephone Encounter (Signed)
Hi Dan.  This patient has you listed as PCP. Can you fill that for her? Thanks.  Herbert Seta

## 2023-04-19 ENCOUNTER — Other Ambulatory Visit: Payer: Self-pay | Admitting: Family Medicine

## 2023-04-19 MED ORDER — TIZANIDINE HCL 4 MG PO TABS: 4.0000 mg | ORAL_TABLET | Freq: Three times a day (TID) | ORAL | 2 refills | Status: DC | PRN

## 2023-04-19 MED ORDER — PREGABALIN 75 MG PO CAPS: 75.0000 mg | ORAL_CAPSULE | Freq: Two times a day (BID) | ORAL | 2 refills | Status: DC

## 2023-04-19 NOTE — Telephone Encounter (Signed)
Copied from CRM [id number]. Topic: Clinical - Medication Refill >> Apr 19, 2023  1:58 PM Rona Ravens wrote: Most Recent Primary Care Visit:  Provider: [name]  Department: [facility]  Visit Type: OFFICE VISIT 20  Date: 04/04/2023  Medication: tiZANidine (ZANAFLEX) 4 MG tablet, pregabalin (LYRICA) 75 MG capsule,   Has the patient contacted their pharmacy?  (Agent: If no, request that the patient contact the pharmacy for the refill. If patient does not wish to contact the pharmacy document the reason why and proceed with request.) (Agent: If yes, when and what did the pharmacy advise?)  Is this the correct pharmacy for this prescription?  If no, delete pharmacy and type the correct one.  This is the patient's preferred pharmacy:  [facility] [address] [place] - [address] [place] [address] Phone: [phone] Fax: [phone]  [facility] - [place], [place] - [address] [place] [address] Phone: [phone] Fax: [phone]   Has the prescription been filled recently?   Is the patient out of the medication?   Has the patient been seen for an appointment in the last year OR does the patient have an upcoming appointment?   Can we respond through MyChart?   Agent: Please be advised that Rx refills may take up to 3 business days. We ask that you follow-up with your pharmacy.

## 2023-04-20 DIAGNOSIS — M47896 Other spondylosis, lumbar region: Secondary | ICD-10-CM | POA: Diagnosis not present

## 2023-04-20 DIAGNOSIS — M5416 Radiculopathy, lumbar region: Secondary | ICD-10-CM | POA: Diagnosis not present

## 2023-04-24 ENCOUNTER — Telehealth: Payer: PPO | Admitting: Family Medicine

## 2023-04-24 ENCOUNTER — Other Ambulatory Visit: Payer: Self-pay | Admitting: Family Medicine

## 2023-04-24 DIAGNOSIS — M549 Dorsalgia, unspecified: Secondary | ICD-10-CM

## 2023-04-24 NOTE — Telephone Encounter (Signed)
Copied from CRM [id number]. Topic: Clinical - Medication Refill >> Apr 24, 2023  9:47 AM Carlatta H wrote: Most Recent Primary Care Visit:  Provider: [name]  Department: [facility]  Visit Type: OFFICE VISIT 20  Date: 04/04/2023  Medication: pregabalin (LYRICA) 75 MG capsule [[id number]]  Has the patient contacted their pharmacy? Yes (Agent: If no, request that the patient contact the pharmacy for the refill. If patient does not wish to contact the pharmacy document the reason why and proceed with request.) (Agent: If yes, when and what did the pharmacy advise?) Pharmacy did not receive order  Is this the correct pharmacy for this prescription? Yes If no, delete pharmacy and type the correct one.  This is the patient's preferred pharmacy:  [facility] [address], [place] - [address] [place] [address] Phone: [phone] Fax: [phone]     Has the prescription been filled recently? No  Is the patient out of the medication? Yes  Has the patient been seen for an appointment in the last year OR does the patient have an upcoming appointment? No  Can we respond through MyChart? No  Agent: Please be advised that Rx refills may take up to 3 business days. We ask that you follow-up with your pharmacy.

## 2023-04-27 MED ORDER — PREGABALIN 75 MG PO CAPS: 75.0000 mg | ORAL_CAPSULE | Freq: Three times a day (TID) | ORAL | 3 refills | Status: DC

## 2023-04-27 NOTE — Addendum Note (Signed)
Addended by: [name] on: 04/27/2023 02:04 PM   Modules accepted: Orders

## 2023-04-27 NOTE — Telephone Encounter (Signed)
Copied from CRM [id number]. Topic: Clinical - Prescription Issue >> Apr 27, 2023 12:54 PM Nila Nephew wrote: Reason for CRM: Patient calling to state that her pregabalin (LYRICA) 75 MG capsule prescription is incorrect. A refill was sent but it was sent at 2 tablets daily and patient states that, due to increased dosage, it needs to be sent in at 3 tablets daily instead. Patient states change was approved by PCP about a month ago but continues to be filled at 2 tablets daily in the system and at the pharmacy. The pharmacy will not fill it early and patient only has today and tomorrow left before she is entirely out.

## 2023-04-27 NOTE — Telephone Encounter (Signed)
Re sent ast TID dosing

## 2023-04-27 NOTE — Progress Notes (Signed)
Because we do not adjust chronic medications your condition warrants further evaluation and I recommend that you be seen in a face to face visit with your PCP, the provider that orders the medication or urgent care(but I do not believe Urgent care will adjust them either). But if you are in severe pain, that is the place to go to.    NOTE: There will be NO CHARGE for this eVisit   If you are having a true medical emergency please call 911.

## 2023-05-18 ENCOUNTER — Other Ambulatory Visit: Payer: Self-pay | Admitting: Family Medicine

## 2023-05-22 ENCOUNTER — Other Ambulatory Visit: Payer: Self-pay | Admitting: Nurse Practitioner

## 2023-05-22 DIAGNOSIS — E1129 Type 2 diabetes mellitus with other diabetic kidney complication: Secondary | ICD-10-CM

## 2023-05-22 DIAGNOSIS — N183 Chronic kidney disease, stage 3 unspecified: Secondary | ICD-10-CM

## 2023-05-22 NOTE — Telephone Encounter (Signed)
Hedwig Morton. You are listed as this patient's PCP. I hope things are going well. -Herbert Seta

## 2023-05-22 NOTE — Telephone Encounter (Signed)
Can you approve these under Dr. Lonell Face name so refills go to him as well?

## 2023-06-05 ENCOUNTER — Encounter: Payer: Self-pay | Admitting: Family Medicine

## 2023-06-05 ENCOUNTER — Ambulatory Visit (INDEPENDENT_AMBULATORY_CARE_PROVIDER_SITE_OTHER): Payer: PPO | Admitting: Family Medicine

## 2023-06-05 VITALS — BP 111/60 | HR 63 | Ht 66.0 in | Wt 254.0 lb

## 2023-06-05 DIAGNOSIS — E1169 Type 2 diabetes mellitus with other specified complication: Secondary | ICD-10-CM

## 2023-06-05 DIAGNOSIS — E119 Type 2 diabetes mellitus without complications: Secondary | ICD-10-CM

## 2023-06-05 DIAGNOSIS — I480 Paroxysmal atrial fibrillation: Secondary | ICD-10-CM

## 2023-06-05 DIAGNOSIS — R062 Wheezing: Secondary | ICD-10-CM | POA: Diagnosis not present

## 2023-06-05 DIAGNOSIS — E559 Vitamin D deficiency, unspecified: Secondary | ICD-10-CM

## 2023-06-05 DIAGNOSIS — E1159 Type 2 diabetes mellitus with other circulatory complications: Secondary | ICD-10-CM

## 2023-06-05 DIAGNOSIS — E785 Hyperlipidemia, unspecified: Secondary | ICD-10-CM

## 2023-06-05 DIAGNOSIS — I152 Hypertension secondary to endocrine disorders: Secondary | ICD-10-CM | POA: Diagnosis not present

## 2023-06-05 DIAGNOSIS — Z794 Long term (current) use of insulin: Secondary | ICD-10-CM

## 2023-06-05 DIAGNOSIS — Z6841 Body Mass Index (BMI) 40.0 and over, adult: Secondary | ICD-10-CM

## 2023-06-05 DIAGNOSIS — G894 Chronic pain syndrome: Secondary | ICD-10-CM

## 2023-06-05 DIAGNOSIS — I5041 Acute combined systolic (congestive) and diastolic (congestive) heart failure: Secondary | ICD-10-CM

## 2023-06-05 DIAGNOSIS — J439 Emphysema, unspecified: Secondary | ICD-10-CM

## 2023-06-05 DIAGNOSIS — N183 Chronic kidney disease, stage 3 unspecified: Secondary | ICD-10-CM

## 2023-06-05 LAB — POCT GLYCOSYLATED HEMOGLOBIN (HGB A1C): HbA1c POC (<> result, manual entry): 8.1 % (ref 4.0–5.6)

## 2023-06-05 MED ORDER — INSULIN GLARGINE 100 UNITS/ML SOLOSTAR PEN: PEN_INJECTOR | SUBCUTANEOUS | 11 refills | Status: DC

## 2023-06-05 MED ORDER — TIZANIDINE HCL 4 MG PO TABS: 4.0000 mg | ORAL_TABLET | Freq: Three times a day (TID) | ORAL | 2 refills | Status: DC | PRN

## 2023-06-05 MED ORDER — IPRATROPIUM BROMIDE 0.02 % IN SOLN: 0.5000 mg | Freq: Two times a day (BID) | RESPIRATORY_TRACT | 5 refills | Status: DC

## 2023-06-05 NOTE — Assessment & Plan Note (Signed)
Recheck kidney function today.

## 2023-06-05 NOTE — Progress Notes (Unsigned)
Established Patient Office Visit  Subjective   Patient ID: Kaitlin Cantrell, female    DOB: Nov 17, 1955  Age: 68 y.o. MRN: [id number]  Chief Complaint  Patient presents with   Medical Management of Chronic Issues    HPI  DM2-patient is taking Lantus 15 units twice a day and glimepiride.  We discussed trying to get her prescribed Jardiance again.  She is open to talking to somebody to help with affordability of this medication.  Patient is currently going to [facility] for back pain and does physical therapy.  She is taking Lyrica every 8 hours.  Takes Zanaflex as needed.  Taking it to 3 times a day currently.   The 10-year ASCVD risk score (Arnett DK, et al., 2019) is: 20.8%  Health Maintenance Due  Topic Date Due   COVID-19 Vaccine (1) Never done   Zoster Vaccines- Shingrix (1 of 2) 12/06/1976   Cervical Cancer Screening (HPV/Pap Cotest)  03/10/2014   OPHTHALMOLOGY EXAM  03/30/2017   Medicare Annual Wellness (AWV)  12/28/2017   MAMMOGRAM  11/22/2019   INFLUENZA VACCINE  12/01/2022   DEXA SCAN  Never done      Objective:     BP 111/60   Pulse 63   Ht 5\' 6"  (1.676 m)   Wt 254 lb (115.2 kg)   SpO2 96%   BMI 41.00 kg/m    Physical Exam General: Alert, oriented CV: Regular rate rhythm Pulmonary: Lungs clear bilaterally.  Decreased inspiratory effort bilaterally.     Assessment & Plan:   Type 2 diabetes mellitus without complication, without long-term current use of insulin (HCC) Assessment & Plan: A1c is 8.1.  Will increase Lantus to 20/15 from 15/15.  Encouraged her to use the freestyle [place] or routinely.  She is connected to [place] by her [place] view.  Try to get UACR today but not able to provide significant amount of urine.  Will attempt to get financial assistance for Jardiance.  Showed her the Medicare website for application of financial aid.  Resent in referral for help with medication affordability.  Advised patient not to decline it when they call this  time.  Orders: -     Comprehensive metabolic panel -     POCT glycosylated hemoglobin (Hb A1C) -     Microalbumin / creatinine urine ratio -     AMB Referral VBCI Care Management  Wheezing -     Ipratropium Bromide; Take 2.5 mLs (0.5 mg total) by nebulization in the morning and at bedtime.  Dispense: 75 mL; Refill: 5  Combined systolic and diastolic congestive heart failure [facility]) Assessment & Plan: Follows with Dr. Shirlee Latch.  Continue management per cardiology.  Not in exacerbation today.  Orders: -     CBC with Differential/Platelet  Hypertension associated with type 2 diabetes mellitus (HCC) Assessment & Plan: Continue amlodipine and Entresto.  Blood pressure at goal today.   Vitamin D deficiency -     VITAMIN D 25 Hydroxy (Vit-D Deficiency, Fractures)  Hyperlipidemia associated with type 2 diabetes mellitus (HCC) Assessment & Plan: Continue atorvastatin 80 mg.  Recheck cholesterol level today.  Orders: -     Lipid panel  Stage 3 chronic kidney disease, unspecified whether stage 3a or 3b CKD (HCC) Assessment & Plan: Recheck kidney function today.  Adjust medications as needed.   Chronic pain syndrome Assessment & Plan: Continue taking Lyrica 3 times daily and Zanaflex 3 times daily as needed.  Sees [facility].   Pulmonary emphysema, unspecified emphysema type (  HCC) Assessment & Plan: Continue Symbicort.  Refilled ipratropium nebulizer today.  No wheezing on exam today.   Paroxysmal atrial fibrillation (HCC) Assessment & Plan: Continue metoprolol and Xarelto.  Follows with cardiology.   BMI 40.0-44.9, adult [facility]) Assessment & Plan: Continue to encourage weight loss to improve diabetic control and ASCVD risk.   Other orders -     insulin glargine; 20 Units in AM, 15 Units in the evening (8pm)  Dispense: 15 mL; Refill: 11 -     tiZANidine HCl; Take 1 tablet (4 mg total) by mouth every 8 (eight) hours as needed for muscle spasms.  Dispense: 90 tablet; Refill:  2   I spent 35 minutes in the management this patient  Return in about 3 months (around 09/02/2023) for DM.    [name], MD

## 2023-06-05 NOTE — Assessment & Plan Note (Signed)
Continue taking Lyrica 3 times daily and Zanaflex 3 times daily as needed.  Sees [facility].

## 2023-06-05 NOTE — Patient Instructions (Addendum)
It was nice to see you today,  We addressed the following topics today: -I would like you to increase your Lantus to 20 units in the morning and keep it at 15 units in the evening. - Try to use your continuous glucose monitor so that you can keep track of your blood sugar levels and so that you can be notified if your blood sugar levels are too low - I will send in another referral for someone to help you with your Jardiance and being able to afford it.  If they call, this is what is for.  Last time they said you declined the service. - You can also go on the Medicare website yourself and fill out the application for medication assistance.  It is called the extra help program and website is: https://www.bishop.org/ -I have sent in a new prescription for ipratropium and I have also increase the muscle relaxant frequency to 3 times a day.  Have a great day,  Frederic Jericho, MD

## 2023-06-05 NOTE — Assessment & Plan Note (Signed)
A1c is 8.1.  Will increase Lantus to 20/15 from 15/15.  Encouraged her to use the freestyle [place] or routinely.  She is connected to [place] by her [place] view.  Try to get UACR today but not able to provide significant amount of urine.  Will attempt to get financial assistance for Jardiance.  Showed her the Medicare website for application of financial aid.  Resent in referral for help with medication affordability.  Advised patient not to decline it when they call this time.

## 2023-06-06 ENCOUNTER — Encounter: Payer: Self-pay | Admitting: Family Medicine

## 2023-06-06 LAB — CBC WITH DIFFERENTIAL/PLATELET
Basophils Absolute: 0.1 10*3/uL (ref 0.0–0.2)
Basos: 1 %
EOS (ABSOLUTE): 0.4 10*3/uL (ref 0.0–0.4)
Eos: 4 %
Hematocrit: 40.8 % (ref 34.0–46.6)
Hemoglobin: 12.8 g/dL (ref 11.1–15.9)
Immature Grans (Abs): 0 10*3/uL (ref 0.0–0.1)
Immature Granulocytes: 0 %
Lymphocytes Absolute: 2.1 10*3/uL (ref 0.7–3.1)
Lymphs: 23 %
MCH: 27.6 pg (ref 26.6–33.0)
MCHC: 31.4 g/dL — ABNORMAL LOW (ref 31.5–35.7)
MCV: 88 fL (ref 79–97)
Monocytes Absolute: 0.8 10*3/uL (ref 0.1–0.9)
Monocytes: 9 %
Neutrophils Absolute: 5.5 10*3/uL (ref 1.4–7.0)
Neutrophils: 63 %
Platelets: 252 10*3/uL (ref 150–450)
RBC: 4.63 x10E6/uL (ref 3.77–5.28)
RDW: 13.9 % (ref 11.7–15.4)
WBC: 8.8 10*3/uL (ref 3.4–10.8)

## 2023-06-06 LAB — COMPREHENSIVE METABOLIC PANEL
ALT: 5 [IU]/L (ref 0–32)
AST: 10 [IU]/L (ref 0–40)
Albumin: 3.7 g/dL — ABNORMAL LOW (ref 3.9–4.9)
Alkaline Phosphatase: 104 [IU]/L (ref 44–121)
BUN/Creatinine Ratio: 14 (ref 12–28)
BUN: 23 mg/dL (ref 8–27)
Bilirubin Total: 0.3 mg/dL (ref 0.0–1.2)
CO2: 22 mmol/L (ref 20–29)
Calcium: 9 mg/dL (ref 8.7–10.3)
Chloride: 104 mmol/L (ref 96–106)
Creatinine, Ser: 1.62 mg/dL — ABNORMAL HIGH (ref 0.57–1.00)
Globulin, Total: 2.3 g/dL (ref 1.5–4.5)
Glucose: 164 mg/dL — ABNORMAL HIGH (ref 70–99)
Potassium: 5.2 mmol/L (ref 3.5–5.2)
Sodium: 140 mmol/L (ref 134–144)
Total Protein: 6 g/dL (ref 6.0–8.5)
eGFR: 35 mL/min/{1.73_m2} — ABNORMAL LOW (ref >59)

## 2023-06-06 LAB — LIPID PANEL
Chol/HDL Ratio: 5 {ratio} — ABNORMAL HIGH (ref 0.0–4.4)
Cholesterol, Total: 150 mg/dL (ref 100–199)
HDL: 30 mg/dL — ABNORMAL LOW (ref >39)
LDL Chol Calc (NIH): 91 mg/dL (ref 0–99)
Triglycerides: 165 mg/dL — ABNORMAL HIGH (ref 0–149)
VLDL Cholesterol Cal: 29 mg/dL (ref 5–40)

## 2023-06-06 LAB — VITAMIN D 25 HYDROXY (VIT D DEFICIENCY, FRACTURES): Vit D, 25-Hydroxy: 68.2 ng/mL (ref 30.0–100.0)

## 2023-06-06 NOTE — Assessment & Plan Note (Signed)
Follows with Dr. Shirlee Latch.  Continue management per cardiology.  Not in exacerbation today.

## 2023-06-06 NOTE — Assessment & Plan Note (Signed)
Continue Symbicort.  Refilled ipratropium nebulizer today.  No wheezing on exam today.

## 2023-06-06 NOTE — Assessment & Plan Note (Signed)
Continue to encourage weight loss to improve diabetic control and ASCVD risk.

## 2023-06-06 NOTE — Assessment & Plan Note (Signed)
Continue metoprolol and Xarelto.  Follows with cardiology.

## 2023-06-06 NOTE — Assessment & Plan Note (Signed)
Continue amlodipine and Entresto.  Blood pressure at goal today.

## 2023-06-06 NOTE — Assessment & Plan Note (Signed)
Continue atorvastatin 80 mg.  Recheck cholesterol level today.

## 2023-06-07 ENCOUNTER — Telehealth: Payer: Self-pay

## 2023-06-07 NOTE — Progress Notes (Signed)
 Care Guide Pharmacy Note  06/07/2023 Name: [name] MRN: [id number] DOB: 1956-01-27  Referred By: Chandra Toribio POUR, MD Reason for referral: Care Coordination (Outreach to schedule with pharm d )   Kaitlin Cantrell is a 68 y.o. year old female who is a primary care patient of Chandra Toribio POUR, MD.  Arland JONELLE Hoard was referred to the pharmacist for assistance related to: DMII  An unsuccessful telephone outreach was attempted today to contact the patient who was referred to the pharmacy team for assistance with medication assistance. Additional attempts will be made to contact the patient.  Jeoffrey Buffalo , RMA     [facility]  [facility], [facility] Guide  Direct Dial: [phone]  Website: [email or web address]

## 2023-06-10 ENCOUNTER — Other Ambulatory Visit: Payer: Self-pay | Admitting: Nurse Practitioner

## 2023-06-10 DIAGNOSIS — E1169 Type 2 diabetes mellitus with other specified complication: Secondary | ICD-10-CM

## 2023-06-12 MED ORDER — ATORVASTATIN CALCIUM 80 MG PO TABS
ORAL_TABLET | ORAL | 3 refills | Status: AC
Start: 2023-06-12 — End: ?

## 2023-06-14 NOTE — Progress Notes (Signed)
Care Guide Pharmacy Note  06/14/2023 Name: Kaitlin Cantrell MRN: [id number] DOB: 06/15/1955  Referred By: [name], MD Reason for referral: Care Coordination (Outreach to schedule with pharm d )   Kaitlin Cantrell is a 68 y.o. year old female who is a primary care patient of [name], MD.  [name] was referred to the pharmacist for assistance related to: DMII  Successful contact was made with the patient to discuss pharmacy services including being ready for the pharmacist to call at least 5 minutes before the scheduled appointment time and to have medication bottles and any blood pressure readings ready for review. The patient agreed to meet with the pharmacist via telephone visit on (date/time).06/23/2023  Penne Lash , RMA     [place]  [facility], [facility] Guide  Direct Dial: [phone]  Website: [email or web address]

## 2023-06-21 ENCOUNTER — Other Ambulatory Visit: Payer: Self-pay | Admitting: Nurse Practitioner

## 2023-06-22 NOTE — Telephone Encounter (Signed)
Hey [name].  This patient has you listed as PCP.  Kaitlin Cantrell

## 2023-06-23 ENCOUNTER — Other Ambulatory Visit: Payer: Self-pay | Admitting: Pharmacist

## 2023-06-23 DIAGNOSIS — E119 Type 2 diabetes mellitus without complications: Secondary | ICD-10-CM

## 2023-06-23 NOTE — Progress Notes (Signed)
   06/23/2023  Patient ID: Kaitlin Cantrell, female   DOB: 1956/04/27, 68 y.o.   MRN: [id number]  Called and spoke with the patient on the phone today after receiving referral from PCP regarding Jardiance cost assistance.  Reviewed insurance plan prior to call. [place] and Entresto are $0 with [employer] plan. Xarelto is $45 monthly (patient has not purchased yet this year). Reports she can afford the Xarelto monthly, but thought the Jardiance would be expensive again.  Advised I just need a response from Dr. Constance Goltz regarding which dose of Jardiance he would prefer sent to the pharmacy and can then be filled. Advised to reach out if the cost is not $0 at the pharmacy- confirmed understanding.  Update on 06/26/23: - Rx for Jardiance 25mg  sent to pharmacy per Dr. Constance Goltz response   Marlowe Aschoff, PharmD [facility] Phone Number: [phone]

## 2023-06-24 ENCOUNTER — Other Ambulatory Visit: Payer: Self-pay | Admitting: Family Medicine

## 2023-06-26 MED ORDER — EMPAGLIFLOZIN 25 MG PO TABS
25.0000 mg | ORAL_TABLET | Freq: Every day | ORAL | 5 refills | Status: DC
Start: 2023-06-26 — End: 2023-08-02

## 2023-07-10 ENCOUNTER — Ambulatory Visit
Admission: RE | Admit: 2023-07-10 | Discharge: 2023-07-10 | Disposition: A | Discharge status: Home / Self Care | Payer: PPO | Source: Ambulatory Visit | Attending: Family Medicine

## 2023-07-10 DIAGNOSIS — F17200 Nicotine dependence, unspecified, uncomplicated: Secondary | ICD-10-CM

## 2023-07-13 ENCOUNTER — Telehealth (HOSPITAL_COMMUNITY): Payer: Self-pay | Admitting: Cardiology

## 2023-07-14 ENCOUNTER — Other Ambulatory Visit: Payer: Self-pay | Admitting: Family Medicine

## 2023-07-17 ENCOUNTER — Ambulatory Visit: Payer: Self-pay | Admitting: Family Medicine

## 2023-07-17 NOTE — Telephone Encounter (Signed)
 Copied from CRM [id number]. Topic: Clinical - Red Word Triage >> Jul 17, 2023 12:04 PM Alessandra Bevels wrote: Red Word that prompted transfer to Nurse Triage: Patient is calling to report chest congestion and shortness of Breath for over a week. Reason for Disposition  [1] Longstanding difficulty breathing (e.g., CHF, COPD, emphysema) AND [2] WORSE than normal  Answer Assessment - Initial Assessment Questions 1. RESPIRATORY STATUS: "Describe your breathing?" (e.g., wheezing, shortness of breath, unable to speak, severe coughing)      I'm very short of breath for a week.   The congestion for over a week. 2. ONSET: "When did this breathing problem begin?"      Over a week the congestion.    Short of breath for about a week. 3. PATTERN "Does the difficult breathing come and go, or has it been constant since it started?"      I'm having sweats and chills.   I'm having shortness of breath when I'm up and around 4. SEVERITY: "How bad is your breathing?" (e.g., mild, moderate, severe)    - MILD: No SOB at rest, mild SOB with walking, speaks normally in sentences, can lie down, no retractions, pulse < 100.    - MODERATE: SOB at rest, SOB with minimal exertion and prefers to sit, cannot lie down flat, speaks in phrases, mild retractions, audible wheezing, pulse 100-120.    - SEVERE: Very SOB at rest, speaks in single words, struggling to breathe, sitting hunched forward, retractions, pulse > 120      Moderate 5. RECURRENT SYMPTOM: "Have you had difficulty breathing before?" If Yes, ask: "When was the last time?" and "What happened that time?"      Yes 6. CARDIAC HISTORY: "Do you have any history of heart disease?" (e.g., heart attack, angina, bypass surgery, angioplasty)      Not asked 7. LUNG HISTORY: "Do you have any history of lung disease?"  (e.g., pulmonary embolus, asthma, emphysema)     Yes COPD 8. CAUSE: "What do you think is causing the breathing problem?"      I had a CT scan on Monday of my  lungs.  Yearly smoker CT scan. 9. OTHER SYMPTOMS: "Do you have any other symptoms? (e.g., dizziness, runny nose, cough, chest pain, fever)     I'm having chest and nasal congestion.   Runny nose.  Coughing up mucus that's sometimes clear and green.  It's coming up from deep it's green. 10. O2 SATURATION MONITOR:  "Do you use an oxygen saturation monitor (pulse oximeter) at home?" If Yes, ask: "What is your reading (oxygen level) today?" "What is your usual oxygen saturation reading?" (e.g., 95%)       N/A 11. PREGNANCY: "Is there any chance you are pregnant?" "When was your last menstrual period?"       N/A 12. TRAVEL: "Have you traveled out of the country in the last month?" (e.g., travel history, exposures)       N/A  Protocols used: Breathing Difficulty-A-AH  Chief Complaint: Chest congestion and shortness of breath that is getting worse over the past week.    Has COPD Symptoms: coughing up some green mucus.  Frequency: For a week now and shortness of breath getting worse Pertinent Negatives: Patient denies fever that she is aware of.   No sore throat. Disposition: [] ED /[x] Urgent Care (no appt availability in office) / [] Appointment(In office/virtual)/ []  [facility]/ [] Home Care/ [] Refused Recommended Disposition /[] [facility] [facility]/ []  Follow-up with PCP Additional Notes:  No appts available with [facility] until August 22, 2023.   I looked at several other practices we could go to but they were too far away.   She didn't want to do a virtual visit.   "I don't have good luck with those".    "I'll go to the local urgent care" and thanked me for trying to get her scheduled.

## 2023-07-18 ENCOUNTER — Encounter (HOSPITAL_COMMUNITY): Payer: Self-pay

## 2023-07-18 ENCOUNTER — Other Ambulatory Visit: Payer: Self-pay

## 2023-07-18 ENCOUNTER — Inpatient Hospital Stay (HOSPITAL_COMMUNITY)
Admission: EM | Admit: 2023-07-18 | Discharge: 2023-07-23 | DRG: 871 | Disposition: A | Discharge status: Home / Self Care | Attending: Internal Medicine | Admitting: Internal Medicine

## 2023-07-18 ENCOUNTER — Emergency Department (HOSPITAL_COMMUNITY)

## 2023-07-18 DIAGNOSIS — I2699 Other pulmonary embolism without acute cor pulmonale: Secondary | ICD-10-CM | POA: Diagnosis present

## 2023-07-18 DIAGNOSIS — N179 Acute kidney failure, unspecified: Secondary | ICD-10-CM | POA: Diagnosis present

## 2023-07-18 DIAGNOSIS — J9611 Chronic respiratory failure with hypoxia: Secondary | ICD-10-CM | POA: Diagnosis present

## 2023-07-18 DIAGNOSIS — E1159 Type 2 diabetes mellitus with other circulatory complications: Secondary | ICD-10-CM | POA: Diagnosis present

## 2023-07-18 DIAGNOSIS — E1169 Type 2 diabetes mellitus with other specified complication: Secondary | ICD-10-CM | POA: Diagnosis present

## 2023-07-18 DIAGNOSIS — J440 Chronic obstructive pulmonary disease with acute lower respiratory infection: Secondary | ICD-10-CM | POA: Diagnosis present

## 2023-07-18 DIAGNOSIS — Z885 Allergy status to narcotic agent status: Secondary | ICD-10-CM

## 2023-07-18 DIAGNOSIS — Z79899 Other long term (current) drug therapy: Secondary | ICD-10-CM

## 2023-07-18 DIAGNOSIS — Z83719 Family history of colon polyps, unspecified: Secondary | ICD-10-CM

## 2023-07-18 DIAGNOSIS — N1832 Chronic kidney disease, stage 3b: Secondary | ICD-10-CM | POA: Diagnosis present

## 2023-07-18 DIAGNOSIS — Z86711 Personal history of pulmonary embolism: Secondary | ICD-10-CM

## 2023-07-18 DIAGNOSIS — Z825 Family history of asthma and other chronic lower respiratory diseases: Secondary | ICD-10-CM

## 2023-07-18 DIAGNOSIS — Z794 Long term (current) use of insulin: Secondary | ICD-10-CM | POA: Diagnosis not present

## 2023-07-18 DIAGNOSIS — I5032 Chronic diastolic (congestive) heart failure: Secondary | ICD-10-CM | POA: Diagnosis not present

## 2023-07-18 DIAGNOSIS — Z96652 Presence of left artificial knee joint: Secondary | ICD-10-CM | POA: Diagnosis present

## 2023-07-18 DIAGNOSIS — R6520 Severe sepsis without septic shock: Secondary | ICD-10-CM | POA: Diagnosis present

## 2023-07-18 DIAGNOSIS — Z833 Family history of diabetes mellitus: Secondary | ICD-10-CM

## 2023-07-18 DIAGNOSIS — J441 Chronic obstructive pulmonary disease with (acute) exacerbation: Secondary | ICD-10-CM | POA: Diagnosis present

## 2023-07-18 DIAGNOSIS — E119 Type 2 diabetes mellitus without complications: Secondary | ICD-10-CM

## 2023-07-18 DIAGNOSIS — E1122 Type 2 diabetes mellitus with diabetic chronic kidney disease: Secondary | ICD-10-CM | POA: Diagnosis present

## 2023-07-18 DIAGNOSIS — I480 Paroxysmal atrial fibrillation: Secondary | ICD-10-CM | POA: Diagnosis present

## 2023-07-18 DIAGNOSIS — J449 Chronic obstructive pulmonary disease, unspecified: Secondary | ICD-10-CM | POA: Diagnosis present

## 2023-07-18 DIAGNOSIS — I959 Hypotension, unspecified: Secondary | ICD-10-CM | POA: Diagnosis present

## 2023-07-18 DIAGNOSIS — I152 Hypertension secondary to endocrine disorders: Secondary | ICD-10-CM | POA: Diagnosis present

## 2023-07-18 DIAGNOSIS — E1165 Type 2 diabetes mellitus with hyperglycemia: Secondary | ICD-10-CM | POA: Diagnosis present

## 2023-07-18 DIAGNOSIS — Z7984 Long term (current) use of oral hypoglycemic drugs: Secondary | ICD-10-CM

## 2023-07-18 DIAGNOSIS — Z888 Allergy status to other drugs, medicaments and biological substances status: Secondary | ICD-10-CM

## 2023-07-18 DIAGNOSIS — E1129 Type 2 diabetes mellitus with other diabetic kidney complication: Secondary | ICD-10-CM

## 2023-07-18 DIAGNOSIS — G894 Chronic pain syndrome: Secondary | ICD-10-CM | POA: Diagnosis present

## 2023-07-18 DIAGNOSIS — J189 Pneumonia, unspecified organism: Secondary | ICD-10-CM | POA: Diagnosis present

## 2023-07-18 DIAGNOSIS — D649 Anemia, unspecified: Secondary | ICD-10-CM | POA: Diagnosis present

## 2023-07-18 DIAGNOSIS — E875 Hyperkalemia: Secondary | ICD-10-CM | POA: Diagnosis present

## 2023-07-18 DIAGNOSIS — Z8249 Family history of ischemic heart disease and other diseases of the circulatory system: Secondary | ICD-10-CM

## 2023-07-18 DIAGNOSIS — R791 Abnormal coagulation profile: Secondary | ICD-10-CM | POA: Diagnosis present

## 2023-07-18 DIAGNOSIS — E785 Hyperlipidemia, unspecified: Secondary | ICD-10-CM | POA: Diagnosis present

## 2023-07-18 DIAGNOSIS — J9601 Acute respiratory failure with hypoxia: Secondary | ICD-10-CM | POA: Diagnosis present

## 2023-07-18 DIAGNOSIS — G2581 Restless legs syndrome: Secondary | ICD-10-CM | POA: Diagnosis present

## 2023-07-18 DIAGNOSIS — Z7901 Long term (current) use of anticoagulants: Secondary | ICD-10-CM

## 2023-07-18 DIAGNOSIS — F17210 Nicotine dependence, cigarettes, uncomplicated: Secondary | ICD-10-CM | POA: Diagnosis present

## 2023-07-18 DIAGNOSIS — K219 Gastro-esophageal reflux disease without esophagitis: Secondary | ICD-10-CM | POA: Diagnosis present

## 2023-07-18 DIAGNOSIS — Z6841 Body Mass Index (BMI) 40.0 and over, adult: Secondary | ICD-10-CM

## 2023-07-18 DIAGNOSIS — J439 Emphysema, unspecified: Secondary | ICD-10-CM | POA: Diagnosis not present

## 2023-07-18 DIAGNOSIS — Z1152 Encounter for screening for COVID-19: Secondary | ICD-10-CM | POA: Diagnosis not present

## 2023-07-18 DIAGNOSIS — A419 Sepsis, unspecified organism: Secondary | ICD-10-CM | POA: Diagnosis present

## 2023-07-18 DIAGNOSIS — I5041 Acute combined systolic (congestive) and diastolic (congestive) heart failure: Secondary | ICD-10-CM | POA: Diagnosis present

## 2023-07-18 DIAGNOSIS — I5042 Chronic combined systolic (congestive) and diastolic (congestive) heart failure: Secondary | ICD-10-CM | POA: Diagnosis present

## 2023-07-18 DIAGNOSIS — E66813 Obesity, class 3: Secondary | ICD-10-CM | POA: Diagnosis present

## 2023-07-18 DIAGNOSIS — Z7951 Long term (current) use of inhaled steroids: Secondary | ICD-10-CM

## 2023-07-18 LAB — RESP PANEL BY RT-PCR (RSV, FLU A&B, COVID)  RVPGX2
Influenza A by PCR: NEGATIVE
Influenza B by PCR: NEGATIVE
Resp Syncytial Virus by PCR: NEGATIVE
SARS Coronavirus 2 by RT PCR: NEGATIVE

## 2023-07-18 LAB — CBC WITH DIFFERENTIAL/PLATELET
Abs Immature Granulocytes: 0.11 10*3/uL — ABNORMAL HIGH (ref 0.00–0.07)
Basophils Absolute: 0.1 10*3/uL (ref 0.0–0.1)
Basophils Relative: 0 %
Eosinophils Absolute: 0 10*3/uL (ref 0.0–0.5)
Eosinophils Relative: 0 %
HCT: 39 % (ref 36.0–46.0)
Hemoglobin: 12 g/dL (ref 12.0–15.0)
Immature Granulocytes: 1 %
Lymphocytes Relative: 7 %
Lymphs Abs: 1.2 10*3/uL (ref 0.7–4.0)
MCH: 28.6 pg (ref 26.0–34.0)
MCHC: 30.8 g/dL (ref 30.0–36.0)
MCV: 92.9 fL (ref 80.0–100.0)
Monocytes Absolute: 1.6 10*3/uL — ABNORMAL HIGH (ref 0.1–1.0)
Monocytes Relative: 9 %
Neutro Abs: 14.6 10*3/uL — ABNORMAL HIGH (ref 1.7–7.7)
Neutrophils Relative %: 83 %
Platelets: 314 10*3/uL (ref 150–400)
RBC: 4.2 MIL/uL (ref 3.87–5.11)
RDW: 14.6 % (ref 11.5–15.5)
WBC: 17.6 10*3/uL — ABNORMAL HIGH (ref 4.0–10.5)
nRBC: 0 % (ref 0.0–0.2)

## 2023-07-18 LAB — PROCALCITONIN: Procalcitonin: 0.14 ng/mL

## 2023-07-18 LAB — COMPREHENSIVE METABOLIC PANEL
ALT: 9 U/L (ref 0–44)
AST: 20 U/L (ref 15–41)
Albumin: 2.6 g/dL — ABNORMAL LOW (ref 3.5–5.0)
Alkaline Phosphatase: 78 U/L (ref 38–126)
Anion gap: 5 (ref 5–15)
BUN: 32 mg/dL — ABNORMAL HIGH (ref 8–23)
CO2: 25 mmol/L (ref 22–32)
Calcium: 8.7 mg/dL — ABNORMAL LOW (ref 8.9–10.3)
Chloride: 104 mmol/L (ref 98–111)
Creatinine, Ser: 2.07 mg/dL — ABNORMAL HIGH (ref 0.44–1.00)
GFR, Estimated: 26 mL/min — ABNORMAL LOW (ref >60)
Glucose, Bld: 211 mg/dL — ABNORMAL HIGH (ref 70–99)
Potassium: 4.5 mmol/L (ref 3.5–5.1)
Sodium: 134 mmol/L — ABNORMAL LOW (ref 135–145)
Total Bilirubin: 0.6 mg/dL (ref 0.0–1.2)
Total Protein: 6.9 g/dL (ref 6.5–8.1)

## 2023-07-18 LAB — PROTIME-INR
INR: 3.1 — ABNORMAL HIGH (ref 0.8–1.2)
INR: 2.9 — ABNORMAL HIGH (ref 0.8–1.2)
Prothrombin Time: 32.2 s — ABNORMAL HIGH (ref 11.4–15.2)
Prothrombin Time: 30.2 s — ABNORMAL HIGH (ref 11.4–15.2)

## 2023-07-18 LAB — APTT: aPTT: 42 s — ABNORMAL HIGH (ref 24–36)

## 2023-07-18 LAB — CBG MONITORING, ED: Glucose-Capillary: 148 mg/dL — ABNORMAL HIGH (ref 70–99)

## 2023-07-18 LAB — I-STAT CG4 LACTIC ACID, ED
Lactic Acid, Venous: 1.5 mmol/L (ref 0.5–1.9)
Lactic Acid, Venous: 1.1 mmol/L (ref 0.5–1.9)

## 2023-07-18 MED ORDER — SODIUM CHLORIDE 0.9 % IV SOLN
500.0000 mg | INTRAVENOUS | Status: DC
  Administered 2023-07-19 – 2023-07-22 (×4): 500 mg via INTRAVENOUS
  Filled 2023-07-18 (×4): qty 5

## 2023-07-18 MED ORDER — SODIUM CHLORIDE 0.9 % IV SOLN
1.0000 g | INTRAVENOUS | Status: DC
  Administered 2023-07-19 – 2023-07-23 (×5): 1 g via INTRAVENOUS
  Filled 2023-07-18 (×5): qty 10

## 2023-07-18 MED ORDER — PREDNISONE 20 MG PO TABS
40.0000 mg | ORAL_TABLET | Freq: Every day | ORAL | Status: AC
Start: 2023-07-19 — End: 2023-07-23
  Administered 2023-07-19 – 2023-07-23 (×5): 40 mg via ORAL
  Filled 2023-07-18 (×5): qty 2

## 2023-07-18 MED ORDER — SODIUM CHLORIDE 0.9 % IV SOLN
1.0000 g | Freq: Once | INTRAVENOUS | Status: AC
  Administered 2023-07-18: 1 g via INTRAVENOUS
  Filled 2023-07-18: qty 10

## 2023-07-18 MED ORDER — IPRATROPIUM BROMIDE 0.02 % IN SOLN
0.5000 mg | Freq: Once | RESPIRATORY_TRACT | Status: AC
  Administered 2023-07-18: 0.5 mg via RESPIRATORY_TRACT
  Filled 2023-07-18: qty 2.5

## 2023-07-18 MED ORDER — LACTATED RINGERS IV SOLN
INTRAVENOUS | Status: AC
Start: 2023-07-18 — End: 2023-07-19

## 2023-07-18 MED ORDER — ACETAMINOPHEN 650 MG RE SUPP: 650.0000 mg | Freq: Four times a day (QID) | RECTAL | Status: DC | PRN

## 2023-07-18 MED ORDER — ACETAMINOPHEN 325 MG PO TABS
650.0000 mg | ORAL_TABLET | Freq: Once | ORAL | Status: AC
  Administered 2023-07-18: 650 mg via ORAL
  Filled 2023-07-18: qty 2

## 2023-07-18 MED ORDER — RIVAROXABAN 20 MG PO TABS
20.0000 mg | ORAL_TABLET | Freq: Every day | ORAL | Status: DC
  Administered 2023-07-19 – 2023-07-23 (×5): 20 mg via ORAL
  Filled 2023-07-18: qty 2
  Filled 2023-07-18 (×4): qty 1

## 2023-07-18 MED ORDER — SODIUM CHLORIDE 0.9% FLUSH
3.0000 mL | Freq: Two times a day (BID) | INTRAVENOUS | Status: DC
  Administered 2023-07-19 – 2023-07-23 (×9): 3 mL via INTRAVENOUS

## 2023-07-18 MED ORDER — LACTATED RINGERS IV BOLUS
1000.0000 mL | Freq: Once | INTRAVENOUS | Status: AC
  Administered 2023-07-18: 1000 mL via INTRAVENOUS

## 2023-07-18 MED ORDER — MOMETASONE FURO-FORMOTEROL FUM 200-5 MCG/ACT IN AERO
2.0000 | INHALATION_SPRAY | Freq: Two times a day (BID) | RESPIRATORY_TRACT | Status: DC
  Administered 2023-07-19 – 2023-07-23 (×8): 2 via RESPIRATORY_TRACT
  Filled 2023-07-18: qty 8.8

## 2023-07-18 MED ORDER — INSULIN ASPART 100 UNIT/ML IJ SOLN
0.0000 [IU] | Freq: Every day | INTRAMUSCULAR | Status: DC
  Administered 2023-07-19 – 2023-07-20 (×2): 2 [IU] via SUBCUTANEOUS
  Administered 2023-07-21: 3 [IU] via SUBCUTANEOUS

## 2023-07-18 MED ORDER — INSULIN ASPART 100 UNIT/ML IJ SOLN
0.0000 [IU] | Freq: Three times a day (TID) | INTRAMUSCULAR | Status: DC
  Administered 2023-07-19 (×2): 8 [IU] via SUBCUTANEOUS
  Administered 2023-07-19: 5 [IU] via SUBCUTANEOUS
  Administered 2023-07-20: 2 [IU] via SUBCUTANEOUS
  Administered 2023-07-20: 8 [IU] via SUBCUTANEOUS
  Administered 2023-07-20: 5 [IU] via SUBCUTANEOUS
  Administered 2023-07-21: 8 [IU] via SUBCUTANEOUS
  Administered 2023-07-21: 5 [IU] via SUBCUTANEOUS
  Administered 2023-07-21: 3 [IU] via SUBCUTANEOUS
  Administered 2023-07-22: 8 [IU] via SUBCUTANEOUS
  Administered 2023-07-22: 5 [IU] via SUBCUTANEOUS

## 2023-07-18 MED ORDER — INSULIN GLARGINE 100 UNIT/ML ~~LOC~~ SOLN
5.0000 [IU] | Freq: Every day | SUBCUTANEOUS | Status: DC
  Administered 2023-07-18: 5 [IU] via SUBCUTANEOUS
  Filled 2023-07-18 (×2): qty 0.05

## 2023-07-18 MED ORDER — PREDNISONE 20 MG PO TABS
60.0000 mg | ORAL_TABLET | Freq: Once | ORAL | Status: AC
  Administered 2023-07-18: 60 mg via ORAL
  Filled 2023-07-18: qty 3

## 2023-07-18 MED ORDER — ACETAMINOPHEN 325 MG PO TABS
650.0000 mg | ORAL_TABLET | Freq: Four times a day (QID) | ORAL | Status: DC | PRN
  Administered 2023-07-21 – 2023-07-22 (×2): 650 mg via ORAL
  Filled 2023-07-18 (×2): qty 2

## 2023-07-18 MED ORDER — POLYETHYLENE GLYCOL 3350 17 G PO PACK
17.0000 g | PACK | Freq: Every day | ORAL | Status: DC | PRN
  Filled 2023-07-18: qty 1

## 2023-07-18 MED ORDER — SODIUM CHLORIDE 0.9 % IV BOLUS
500.0000 mL | Freq: Once | INTRAVENOUS | Status: AC
  Administered 2023-07-18: 500 mL via INTRAVENOUS

## 2023-07-18 MED ORDER — IPRATROPIUM BROMIDE 0.02 % IN SOLN
0.5000 mg | Freq: Four times a day (QID) | RESPIRATORY_TRACT | Status: DC
  Administered 2023-07-18 – 2023-07-21 (×9): 0.5 mg via RESPIRATORY_TRACT
  Filled 2023-07-18 (×10): qty 2.5

## 2023-07-18 MED ORDER — ATORVASTATIN CALCIUM 80 MG PO TABS
80.0000 mg | ORAL_TABLET | Freq: Every day | ORAL | Status: DC
  Administered 2023-07-18 – 2023-07-22 (×5): 80 mg via ORAL
  Filled 2023-07-18 (×5): qty 1

## 2023-07-18 MED ORDER — PANTOPRAZOLE SODIUM 40 MG PO TBEC
40.0000 mg | DELAYED_RELEASE_TABLET | Freq: Every day | ORAL | Status: DC
  Administered 2023-07-19 – 2023-07-23 (×5): 40 mg via ORAL
  Filled 2023-07-18 (×5): qty 1

## 2023-07-18 MED ORDER — SODIUM CHLORIDE 0.9 % IV SOLN
500.0000 mg | Freq: Once | INTRAVENOUS | Status: AC
  Administered 2023-07-18: 500 mg via INTRAVENOUS
  Filled 2023-07-18: qty 5

## 2023-07-18 NOTE — ED Notes (Signed)
 Admitting MD at [facility]. Pt alert, NAD, calm, interactive.

## 2023-07-18 NOTE — ED Notes (Signed)
 C/o SOB. Alert, NAD, calm, increased wob, tachypneic. Denies pain. IV started, blood sent. To xray. Husband in to see, at [facility].

## 2023-07-18 NOTE — ED Notes (Signed)
 New BP cuff. Repositioned. BP remains low/ soft. Asymptomatic. Denies sx or changes. EDP notified.

## 2023-07-18 NOTE — ED Triage Notes (Signed)
 Pt c/o SOB and weaknessx1wk. Pt is tachypneic.

## 2023-07-18 NOTE — ED Notes (Signed)
 EDP at [facility]

## 2023-07-18 NOTE — ED Provider Notes (Signed)
 [place] EMERGENCY DEPARTMENT AT [facility] Provider Note   CSN: [id number] Arrival date & time: 07/18/23  1225     History  Chief Complaint  Patient presents with   Shortness of Breath    Kaitlin Cantrell is a 68 y.o. female.  Patient is a 69 year old female with a past medical history of COPD, CHF, diabetes, hypertension, A-fib on Xarelto presenting to the emergency department with congestion and shortness of breath.  Patient reports for the last 2 to 3 days she has been congested and coughing.  She states she has had increasing shortness of breath.  She states that she has not measured a fever at home but has had hot and cold chills.  She denies any associated chest pain, vomiting or diarrhea.  She states that she has been out of her Atrovent at home but felt like she is needed it.  She denies any known sick contacts.  She denies any lower extremity swelling.  The history is provided by the patient.  Shortness of Breath      Home Medications Prior to Admission medications   Medication Sig Start Date End Date Taking? Authorizing Provider  acetaminophen (TYLENOL) 500 MG tablet Take 1,000 mg by mouth every 6 (six) hours as needed for moderate pain (pain score 4-6).    [provider]  amLODipine (NORVASC) 5 MG tablet Take 1 tablet by mouth once daily 06/26/23   Saralyn Pilar A, PA  atorvastatin (LIPITOR) 80 MG tablet TAKE 1 TABLET BY MOUTH ONCE DAILY AT  6PM 06/12/23   [name], MD  budesonide (PULMICORT) 0.25 MG/2ML nebulizer solution Take 4 mLs (0.5 mg total) by nebulization daily. 08/04/22   Carlean Jews, NP  budesonide-formoterol (SYMBICORT) 160-4.5 MCG/ACT inhaler Inhale 1 puff into the lungs 2 (two) times daily. Patient taking differently: Inhale 1 puff into the lungs 2 (two) times daily as needed (Asthma). 05/24/22   Carlean Jews, NP  Continuous Glucose Sensor (FREESTYLE LIBRE 3 PLUS SENSOR) MISC Change sensor every 15 days. 03/14/23    [name], MD  Elastic Bandages & Supports (MEDICAL COMPRESSION STOCKINGS) MISC Apply to feet in morning.  Remove at night before sleeping. 03/07/23   [name], MD  empagliflozin (JARDIANCE) 25 MG TABS tablet Take 1 tablet (25 mg total) by mouth daily before breakfast. 06/26/23   [name], MD  ergocalciferol (DRISDOL) 1.25 MG (50000 UT) capsule Take 1 capsule (50,000 Units total) by mouth once a week. 08/04/22   Carlean Jews, NP  ferrous sulfate (SV IRON) 325 (65 FE) MG tablet Take 1 tablet by mouth once daily 07/17/23   [name], MD  furosemide (LASIX) 40 MG tablet Take 1 tablet by mouth once daily 05/22/23   [name], MD  glimepiride (AMARYL) 4 MG tablet Take 1 tablet by mouth twice daily 05/22/23   [name], MD  ipratropium (ATROVENT HFA) 17 MCG/ACT inhaler Inhale 2 puffs into the lungs every 6 (six) hours as needed for wheezing. 04/04/23   [name], MD  ipratropium (ATROVENT) 0.02 % nebulizer solution Take 2.5 mLs (0.5 mg total) by nebulization in the morning and at bedtime. 06/05/23   [name], MD  [place] Lanius CAPS Take 1 capsule by mouth daily.     [provider]  LANTUS SOLOSTAR 100 UNIT/ML Solostar Pen INJECT 20 Units in the morning and 15 units in evening 06/22/23   [name], MD  metoprolol (TOPROL-XL) 200 MG 24 hr tablet Take 0.5 tablets (100 mg total) by mouth daily. 02/28/23   Rhetta Mura, MD  omeprazole (PRILOSEC) 20 MG capsule Take 20 mg by mouth 2 (two) times daily before a meal.    [provider]  pregabalin (LYRICA) 75 MG capsule Take 1 capsule (75 mg total) by mouth 3 (three) times daily. 04/27/23   [name], MD  rivaroxaban (XARELTO) 20 MG TABS tablet Take 1 tablet (20 mg total) by mouth daily. 08/04/22   Carlean Jews, NP  sacubitril-valsartan (ENTRESTO) 97-103 MG Take 1 tablet by mouth 2 (two) times daily. 09/07/22   Alen Bleacher, NP  tiZANidine (ZANAFLEX) 4 MG tablet Take 1 tablet (4 mg  total) by mouth every 8 (eight) hours as needed for muscle spasms. 06/05/23   [name], MD      Allergies    Albuterol and Morphine and codeine    Review of Systems   Review of Systems  Respiratory:  Positive for shortness of breath.     Physical Exam Updated Vital Signs BP 119/65   Pulse 82   Temp (!) 100.8 F (38.2 C)   Resp (!) 23   Ht 5\' 6"  (1.676 m)   Wt 115.2 kg   SpO2 92%   BMI 40.99 kg/m  Physical Exam Vitals and nursing note reviewed.  Constitutional:      General: She is not in acute distress.    Appearance: She is well-developed.  HENT:     Head: Normocephalic and atraumatic.     Mouth/Throat:     Mouth: Mucous membranes are moist.  Eyes:     Extraocular Movements: Extraocular movements intact.  Cardiovascular:     Rate and Rhythm: Normal rate and regular rhythm.     Heart sounds: Normal heart sounds.  Pulmonary:     Effort: Pulmonary effort is normal.     Breath sounds: Wheezing (diffuse expiration) and rhonchi (bilateral bases) present.  Abdominal:     Palpations: Abdomen is soft.     Tenderness: There is no abdominal tenderness.  Musculoskeletal:        General: Normal range of motion.     Cervical back: Normal range of motion and neck supple.     Right lower leg: No edema.     Left lower leg: No edema.  Skin:    General: Skin is warm and dry.  Neurological:     General: No focal deficit present.     Mental Status: She is alert and oriented to person, place, and time.  Psychiatric:        Mood and Affect: Mood normal.        Behavior: Behavior normal.     ED Results / Procedures / Treatments   Labs (all labs ordered are listed, but only abnormal results are displayed) Labs Reviewed  COMPREHENSIVE METABOLIC PANEL - Abnormal; Notable for the following components:      Result Value   Sodium 134 (*)    Glucose, Bld 211 (*)    BUN 32 (*)    Creatinine, Ser 2.07 (*)    Calcium 8.7 (*)    Albumin 2.6 (*)    GFR, Estimated 26 (*)     All other components within normal limits  CBC WITH DIFFERENTIAL/PLATELET - Abnormal; Notable for the following components:   WBC 17.6 (*)    Neutro Abs 14.6 (*)    Monocytes Absolute 1.6 (*)    Abs Immature  Granulocytes 0.11 (*)    All other components within normal limits  PROTIME-INR - Abnormal; Notable for the following components:   Prothrombin Time 32.2 (*)    INR 3.1 (*)    All other components within normal limits  CULTURE, BLOOD (ROUTINE X 2)  CULTURE, BLOOD (ROUTINE X 2)  RESP PANEL BY RT-PCR (RSV, FLU A&B, COVID)  RVPGX2  URINALYSIS, W/ REFLEX TO CULTURE (INFECTION SUSPECTED)  I-STAT CG4 LACTIC ACID, ED    EKG EKG Interpretation Date/Time:  Tuesday July 18 2023 12:42:30 EDT Ventricular Rate:  92 PR Interval:  158 QRS Duration:  106 QT Interval:  346 QTC Calculation: 427 R Axis:   -69  Text Interpretation: Sinus rhythm with Premature atrial complexes Left axis deviation Anteroseptal infarct , age undetermined Abnormal ECG Baseline wander Otherwise no significant change Confirmed by Elayne Snare ([id number]) on 07/18/2023 1:21:45 PM  Radiology DG Chest 2 View Result Date: 07/18/2023 CLINICAL DATA:  Shortness of breath.  Suspected sepsis. EXAM: CHEST - 2 VIEW COMPARISON:  CT 07/10/2023.  Radiographs 02/23/2023 and 02/20/2023. FINDINGS: The heart size and mediastinal contours are stable. Chronic central airway thickening with mild pulmonary hyperinflation. No confluent airspace disease, pleural effusion or pneumothorax. The bones appear unremarkable. Telemetry leads overlie the chest. IMPRESSION: Chronic central airway thickening and mild pulmonary hyperinflation. No evidence of acute cardiopulmonary process. Electronically Signed   By: Carey Bullocks M.D.   On: 07/18/2023 15:31    Procedures .Critical Care  Performed by: Rexford Maus, DO Authorized by: Rexford Maus, DO   Critical care provider statement:    Critical care time (minutes):  30   Critical  care was necessary to treat or prevent imminent or life-threatening deterioration of the following conditions:  Respiratory failure and sepsis   Critical care was time spent personally by me on the following activities:  Development of treatment plan with patient or surrogate, discussions with consultants, evaluation of patient's response to treatment, examination of patient, ordering and review of laboratory studies, ordering and review of radiographic studies, ordering and performing treatments and interventions, pulse oximetry, re-evaluation of patient's condition and review of old charts     Medications Ordered in ED Medications  predniSONE (DELTASONE) tablet 60 mg (has no administration in time range)  ipratropium (ATROVENT) nebulizer solution 0.5 mg (has no administration in time range)  acetaminophen (TYLENOL) tablet 650 mg (650 mg Oral Given 07/18/23 1432)  ipratropium (ATROVENT) nebulizer solution 0.5 mg (0.5 mg Nebulization Given 07/18/23 1432)  cefTRIAXone (ROCEPHIN) 1 g in sodium chloride 0.9 % 100 mL IVPB (0 g Intravenous Stopped 07/18/23 1515)  azithromycin (ZITHROMAX) 500 mg in sodium chloride 0.9 % 250 mL IVPB (0 mg Intravenous Stopped 07/18/23 1559)  sodium chloride 0.9 % bolus 500 mL (0 mLs Intravenous Stopped 07/18/23 1515)    ED Course/ Medical Decision Making/ A&P Clinical Course as of 07/18/23 1601  Tue Jul 18, 2023  1426 Labs with AKI and leukocytosis, increased coags from baseline. [VK]  1427 Appears to have consolidation on CXR consistent with pneumonia. Will have antibiotics ordered. Will give gentle fluids with CHF history.  [VK]    Clinical Course User Index [VK] Rexford Maus, DO                                 Medical Decision Making This patient presents to the ED with chief complaint(s) of cough, SOB with pertinent past medical history of  COPD, HTN, a fib on Xarelto which further complicates the presenting complaint. The complaint involves an extensive  differential diagnosis and also carries with it a high risk of complications and morbidity.    The differential diagnosis includes arrhythmia, anemia, pneumonia, pneumothorax, pulmonary edema, pleural effusion, viral syndrome  Additional history obtained: Additional history obtained from spouse Records reviewed Primary Care Documents  ED Course and Reassessment: On patient's arrival she was hypoxic to 85% on room air and febrile, placed on 2 L nasal cannula.  Patient was initially evaluated in triage and had EKG, x-ray and labs initiated.  Patient will additionally have viral swab added on.  Patient's workup is pending at this time.  She does have some mild expiratory wheeze and will be given Atrovent, does have an allergy to albuterol and will be closely reassessed.  Independent labs interpretation:  The following labs were independently interpreted: Leukocytosis, mild AKI  Independent visualization of imaging: - I independently visualized the following imaging with scope of interpretation limited to determining acute life threatening conditions related to emergency care: Chest x-ray, which revealed left lower lobe infiltrate  Consultation: - Consulted or discussed management/test interpretation w/ external professional: Hospitalists  Consideration for admission or further workup: Patient requires admission for her hypoxic respiratory failure Social Determinants of health: N/A    Amount and/or Complexity of Data Reviewed Labs: ordered. Radiology: ordered.  Risk OTC drugs. Prescription drug management. Decision regarding hospitalization.          Final Clinical Impression(s) / ED Diagnoses Final diagnoses:  Pneumonia of left lower lobe due to infectious organism  Acute respiratory failure with hypoxia (HCC)  AKI (acute kidney injury) (HCC)  COPD exacerbation (HCC)    Rx / DC Orders ED Discharge Orders     None         Rexford Maus, DO 07/18/23  1601

## 2023-07-18 NOTE — Sepsis Progress Note (Signed)
 Sepsis protocol monitored by eLink ?

## 2023-07-18 NOTE — H&P (Signed)
 History and Physical   Kaitlin Cantrell JXB:[id number] DOB: 07-31-1955 DOA: 07/18/2023  PCP: [name], MD   Patient coming from: Home  Chief Complaint: Shortness of breath  HPI: Kaitlin Cantrell is a 68 y.o. female with medical history significant of hypertension, hyperlipidemia, diabetes, CKD 3, paroxysmal atrial fibrillation, chronic combined systolic and diastolic CHF, COPD, pulmonary embolism, obesity, chronic pain, anemia, RLS presenting with worsening shortness of breath.  Patient reports some congestion and shortness of breath for the past 2 to 3 days.  Has also had an associated cough.  Shortness of breath has been progressively getting worse.  Did not check for fever at home.  Did have some subjective warmth and chills though.  States that she ran out of her Atrovent at home.  Denies chest pain, abdominal pain, constipation, diarrhea, nausea, vomiting.  ED Course: Vital signs in the ED notable for blood pressure in the 70s to 110 systolic, respiratory rate in the 20s, requiring 2 L to maintain saturations.  Lab workup included CMP with sodium 134, BUN 32, creatinine elevated to 2.07 from baseline 1.5, glucose 211, calcium 8.7, albumin 2.6.  CBC with leukocytosis to 17.6.  PT and INR elevated at 32.2 and 3.1 respectively.  Lactic acid normal with repeat pending.  Respiratory panel for flu COVID and RSV negative.  Urinalysis and blood cultures pending.  Chest x-ray with chronic central airway thickening and mild pulmonary hyperinflation but no acute abnormality noted.  Patient received Tylenol, ceftriaxone, azithromycin, prednisone, Atrovent in the ED.  Patient initially received 500 cc IV fluids in the setting of CHF history.  However patient became hypotensive to the 70s systolic and 2 L have been ordered for bolus and administered via pressure bag.  Blood pressure has improved but will monitor in the ED before bringing patient up to the floor to ensure she does not need pressor  support.  Review of Systems: As per HPI otherwise all other systems reviewed and are negative.  Past Medical History:  Diagnosis Date   Allergy    seasonal   Arthritis    Atrial flutter with rapid ventricular response (HCC) 12/06/2019   Diabetes mellitus    GERD (gastroesophageal reflux disease)    History of pneumonia    15 years ago   Hyperlipidemia    Hypertension    PE (pulmonary embolism) 12/2015    Past Surgical History:  Procedure Laterality Date   ANKLE FRACTURE SURGERY  1974   left and pinned then pins removed   ANKLE GANGLION CYST EXCISION  1994   right   BACK SURGERY  1998   discectomy and then fusion   CARDIAC CATHETERIZATION N/A 04/05/2016   Procedure: Right/Left Heart Cath and Coronary Angiography;  Surgeon: Laurey Morale, MD;  Location: [facility] INVASIVE CV LAB;  Service: Cardiovascular;  Laterality: N/A;   CARDIOVERSION N/A 01/02/2020   Procedure: CARDIOVERSION;  Surgeon: Laurey Morale, MD;  Location[facility] ENDOSCOPY;  Service: Cardiovascular;  Laterality: N/A;   KNEE ARTHROPLASTY  02/14/2012   Procedure: COMPUTER ASSISTED TOTAL KNEE ARTHROPLASTY;  Surgeon: Cammy Copa, MD;  Location: [facility] OR;  Service: Orthopedics;  Laterality: Left;  Left total knee arthroplasty   TEE WITHOUT CARDIOVERSION N/A 01/02/2020   Procedure: TRANSESOPHAGEAL ECHOCARDIOGRAM (TEE);  Surgeon: Laurey Morale, MD;  Location: [facility] ENDOSCOPY;  Service: Cardiovascular;  Laterality: N/A;   TONSILLECTOMY  1972   TUBAL LIGATION  1996   UMBILICAL HERNIA REPAIR  1996    Social History  reports  that she has been smoking cigarettes. She has a 45 pack-year smoking history. She has been exposed to tobacco smoke. She has never used smokeless tobacco. She reports current alcohol use. She reports that she does not use drugs.  Allergies  Allergen Reactions   Albuterol Anaphylaxis    Throat/lips swelling   Morphine And Codeine Other (See Comments)    Very aggressive and doesn't help pain    Family  History  Problem Relation Age of Onset   Colon polyps Father    Diabetes Father        mother   Heart disease Father    Heart murmur Mother    Diabetes Mother    Hypertension Mother    Diabetes Brother    Cancer Maternal Grandmother    Diabetes Paternal Grandmother    Heart disease Paternal Grandmother    COPD Paternal Grandfather    Heart disease Paternal Grandfather    Hypertension Brother    Hypertension Brother   Reviewed on admission  Prior to Admission medications   Medication Sig Start Date End Date Taking? Authorizing Provider  acetaminophen (TYLENOL) 500 MG tablet Take 1,000 mg by mouth every 6 (six) hours as needed for moderate pain (pain score 4-6).   Yes [provider]  amLODipine (NORVASC) 5 MG tablet Take 1 tablet by mouth once daily 06/26/23  Yes Saralyn Pilar A, PA  atorvastatin (LIPITOR) 80 MG tablet TAKE 1 TABLET BY MOUTH ONCE DAILY AT  6PM 06/12/23  Yes [name], MD  budesonide (PULMICORT) 0.25 MG/2ML nebulizer solution Take 4 mLs (0.5 mg total) by nebulization daily. 08/04/22  Yes Carlean Jews, NP  empagliflozin (JARDIANCE) 25 MG TABS tablet Take 1 tablet (25 mg total) by mouth daily before breakfast. 06/26/23  Yes [name], MD  ergocalciferol (DRISDOL) 1.25 MG (50000 UT) capsule Take 1 capsule (50,000 Units total) by mouth once a week. 08/04/22  Yes Carlean Jews, NP  ferrous sulfate (SV IRON) 325 (65 FE) MG tablet Take 1 tablet by mouth once daily 07/17/23  Yes [name], MD  furosemide (LASIX) 40 MG tablet Take 1 tablet by mouth once daily 05/22/23  Yes [name], MD  glimepiride (AMARYL) 4 MG tablet Take 1 tablet by mouth twice daily 05/22/23  Yes [name], MD  ipratropium (ATROVENT HFA) 17 MCG/ACT inhaler Inhale 2 puffs into the lungs every 6 (six) hours as needed for wheezing. 04/04/23  Yes [name], MD  ipratropium (ATROVENT) 0.02 % nebulizer solution Take 2.5 mLs (0.5 mg total) by nebulization in the morning  and at bedtime. 06/05/23  Yes [name], MD  [place] Lan[facility] CAPS Take 1 capsule by mouth daily.    Yes [provider]  LANTUS SOLOSTAR 100 UNIT/ML Solostar Pen INJECT 20 Units in the morning and 15 units in evening 06/22/23  Yes [name], MD  metoprolol (TOPROL-XL) 200 MG 24 hr tablet Take 0.5 tablets (100 mg total) by mouth daily. 02/28/23  Yes Rhetta Mura, MD  omeprazole (PRILOSEC) 20 MG capsule Take 20 mg by mouth daily.   Yes [provider]  pregabalin (LYRICA) 75 MG capsule Take 1 capsule (75 mg total) by mouth 3 (three) times daily. 04/27/23  Yes [name], MD  rivaroxaban (XARELTO) 20 MG TABS tablet Take 1 tablet (20 mg total) by mouth daily. 08/04/22  Yes Boscia, Heather E, NP  sacubitril-valsartan (ENTRESTO) 97-103 MG Take 1 tablet by mouth 2 (two) times  daily. 09/07/22  Yes Alen Bleacher, NP  tiZANidine (ZANAFLEX) 4 MG tablet Take 1 tablet (4 mg total) by mouth every 8 (eight) hours as needed for muscle spasms. 06/05/23  Yes [name], MD  budesonide-formoterol [facility]) 160-4.5 MCG/ACT inhaler Inhale 1 puff into the lungs 2 (two) times daily. Patient not taking: Reported on 07/18/2023 05/24/22   Carlean Jews, NP  Continuous Glucose Sensor (FREESTYLE LIBRE 3 PLUS SENSOR) MISC Change sensor every 15 days. Patient not taking: Reported on 07/18/2023 03/14/23   [name], MD  Elastic Bandages & Supports (MEDICAL COMPRESSION STOCKINGS) MISC Apply to feet in morning.  Remove at night before sleeping. 03/07/23   [name], MD    Physical Exam: Vitals:   07/18/23 1715 07/18/23 1725 07/18/23 1730 07/18/23 1735  BP: (!) 73/50 (!) 89/45 (!) 107/56 (!) 108/42  Pulse: 67 70 74 73  Resp: (!) 25 (!) 24 (!) 23 (!) 25  Temp:      TempSrc:      SpO2: 91% 92% 92% 93%  Weight:      Height:        Physical Exam Constitutional:      General: She is not in acute distress.    Appearance: Normal appearance.  HENT:     Head: Normocephalic and  atraumatic.     Mouth/Throat:     Mouth: Mucous membranes are moist.     Pharynx: Oropharynx is clear.  Eyes:     Extraocular Movements: Extraocular movements intact.     Pupils: Pupils are equal, round, and reactive to light.  Cardiovascular:     Rate and Rhythm: Normal rate and regular rhythm.     Pulses: Normal pulses.     Heart sounds: Normal heart sounds.  Pulmonary:     Effort: Pulmonary effort is normal. No respiratory distress.     Breath sounds: Wheezing and rales (Trace) present.  Abdominal:     General: Bowel sounds are normal. There is no distension.     Palpations: Abdomen is soft.     Tenderness: There is no abdominal tenderness.  Musculoskeletal:        General: No swelling or deformity.  Skin:    General: Skin is warm and dry.  Neurological:     General: No focal deficit present.     Mental Status: Mental status is at baseline.    Labs on Admission: I have personally reviewed following labs and imaging studies  CBC: Recent Labs  Lab 07/18/23 1245  WBC 17.6*  NEUTROABS 14.6*  HGB 12.0  HCT 39.0  MCV 92.9  PLT 314    Basic Metabolic Panel: Recent Labs  Lab 07/18/23 1245  NA 134*  K 4.5  CL 104  CO2 25  GLUCOSE 211*  BUN 32*  CREATININE 2.07*  CALCIUM 8.7*    GFR: Estimated Creatinine Clearance: 34.9 mL/min (A) (by C-G formula based on SCr of 2.07 mg/dL (H)).  Liver Function Tests: Recent Labs  Lab 07/18/23 1245  AST 20  ALT 9  ALKPHOS 78  BILITOT 0.6  PROT 6.9  ALBUMIN 2.6*    Urine analysis:    Component Value Date/Time   COLORURINE YELLOW 12/29/2015 1252   APPEARANCEUR CLEAR 12/29/2015 1252   LABSPEC 1.029 12/29/2015 1252   PHURINE 5.0 12/29/2015 1252   GLUCOSEU NEGATIVE 12/29/2015 1252   HGBUR NEGATIVE 12/29/2015 1252   BILIRUBINUR NEGATIVE 12/29/2015 1252   BILIRUBINUR small 06/08/2012 1237   KETONESUR NEGATIVE 12/29/2015 1252  PROTEINUR NEGATIVE 12/29/2015 1252   UROBILINOGEN 0.2 06/08/2012 1237   UROBILINOGEN 0.2  02/07/2012 1135   NITRITE NEGATIVE 12/29/2015 1252   LEUKOCYTESUR NEGATIVE 12/29/2015 1252    Radiological Exams on Admission: DG Chest 2 View Result Date: 07/18/2023 CLINICAL DATA:  Shortness of breath.  Suspected sepsis. EXAM: CHEST - 2 VIEW COMPARISON:  CT 07/10/2023.  Radiographs 02/23/2023 and 02/20/2023. FINDINGS: The heart size and mediastinal contours are stable. Chronic central airway thickening with mild pulmonary hyperinflation. No confluent airspace disease, pleural effusion or pneumothorax. The bones appear unremarkable. Telemetry leads overlie the chest. IMPRESSION: Chronic central airway thickening and mild pulmonary hyperinflation. No evidence of acute cardiopulmonary process. Electronically Signed   By: Carey Bullocks M.D.   On: 07/18/2023 15:31   EKG: Independently reviewed.  Sinus rhythm at 90 bpm.  PAC noted.  Minimal baseline artifact except for lead V3 and V6 which have severe baseline wander.  Possible early left bundle branch block with QRS 106.  Similar to previous.  Assessment/Plan Active Problems:   Hypertension associated with type 2 diabetes mellitus (HCC)   Type 2 diabetes mellitus without complication, without long-term current use of insulin (HCC)   Hyperlipidemia associated with type 2 diabetes mellitus (HCC)   Chronic pain syndrome   Chronic obstructive pulmonary disease (HCC)   Pulmonary thromboembolism (HCC)   Combined systolic and diastolic congestive heart failure (HCC)   Stage 3 chronic kidney disease (HCC)   Paroxysmal atrial fibrillation (HCC)   BMI 40.0-44.9, adult (HCC)   Normocytic anemia   Restless leg   Acute respiratory failure with hypoxia COPD exacerbation ?Pneumonia ?Sepsis Hypotension > Presenting with worsening shortness of breath as above.  Requiring 2 L to maintain saturations in the ED. > Patient meets sepsis criteria with leukocytosis to 17.6, tachypnea, hypoxia, hypotension with presumed source of pneumonia. > Initial x-ray  read shows chronic changes but pulmonary complaints are patient's only complaints x-ray is somewhat abnormal, could be difficult to tell if any acute on chronic change. > No other source of infection have been identified.  Urinalysis and blood cultures are pending.   > Initial lactic acid normal, repeat pending.  Patient did become significantly hypotensive which was confirmed after changing blood pressure cuffs in the ED with blood pressure in the 70s systolic.  Has improved after 2 L infused via pressure bag.  Will monitor closely in the ED to ensure that blood pressure remains improved and patient does not need pressor support. - Monitor on progressive unit  if blood pressure is maintained an hour after fluids were bolused.  Otherwise we will need to get in touch with critical care. - Continue with IV fluids - Continue with ceftriaxone, azithromycin - Follow-up U/A, blood cultures - Trend fever curve and WBC - Continue with prednisone, nebulizer treatment for COPD - Consider CT if patient does not respond to treatment and other studies are negative.  Did have recent CT 8 days ago so we will hold off. - Supportive care  AKI on CKD 3B > Creatinine elevated to 2.7 from baseline 1.5 in the ED.  Has received IV fluids as above. - Continue IV fluids - Trend renal function and electrolytes  Hypertension - Hold antihypertensives  Hyperlipidemia - Continue home atorvastatin  Diabetes > 15U qhs at home - 5U qhs - SSI  Paroxysmal atrial fibrillation - Holding metoprolol as above - Continue home Xarelto  Chronic combined systolic and diastolic CHF > Last echo in 2024 showed EF 60-65%, normal diastolic function,  normal RV function. > Trace rales, after receiving 2.5 L IV fluids.  Will monitor closely. - Holding Entresto, metoprolol, Lasix as above. - I's and O's, daily weights  History of PE - Continue home Xarelto  DVT prophylaxis: Xarelto Code Status:   Full Family Communication:   None on admission  Disposition Plan:   Patient is from:  Home  Anticipated DC to:  Home  Anticipated DC date:  2 to 4 days  Anticipated DC barriers: None  Consults called:  None Admission status:  Patient, progressive  Severity of Illness: The appropriate patient status for this patient is INPATIENT. Inpatient status is judged to be reasonable and necessary in order to provide the required intensity of service to ensure the patient's safety. The patient's presenting symptoms, physical exam findings, and initial radiographic and laboratory data in the context of their chronic comorbidities is felt to place them at high risk for further clinical deterioration. Furthermore, it is not anticipated that the patient will be medically stable for discharge from the hospital within 2 midnights of admission.   * I certify that at the point of admission it is my clinical judgment that the patient will require inpatient hospital care spanning beyond 2 midnights from the point of admission due to high intensity of service, high risk for further deterioration and high frequency of surveillance required.Synetta Fail MD [facility]  How to contact the [facility] Attending or Consulting provider 7A - 7P or covering provider during after hours 7P -7A, for this patient?   Check the care team in [facility] and look for a) attending/consulting [facility] provider listed and b) the [facility] team listed Log into [email or web address] and use [place]'s universal password to access. If you do not have the password, please contact the hospital operator. Locate the [facility] provider you are looking for under [facility] and page to a number that you can be directly reached. If you still have difficulty reaching the provider, please page the [facility] (Director on Call) for the Hospitalists listed on amion for assistance.  07/18/2023, 5:51 PM

## 2023-07-19 DIAGNOSIS — J9601 Acute respiratory failure with hypoxia: Secondary | ICD-10-CM | POA: Diagnosis not present

## 2023-07-19 LAB — CBC
HCT: 36.4 % (ref 36.0–46.0)
Hemoglobin: 10.9 g/dL — ABNORMAL LOW (ref 12.0–15.0)
MCH: 28.7 pg (ref 26.0–34.0)
MCHC: 29.9 g/dL — ABNORMAL LOW (ref 30.0–36.0)
MCV: 95.8 fL (ref 80.0–100.0)
Platelets: 268 10*3/uL (ref 150–400)
RBC: 3.8 MIL/uL — ABNORMAL LOW (ref 3.87–5.11)
RDW: 14.6 % (ref 11.5–15.5)
WBC: 15.8 10*3/uL — ABNORMAL HIGH (ref 4.0–10.5)
nRBC: 0 % (ref 0.0–0.2)

## 2023-07-19 LAB — COMPREHENSIVE METABOLIC PANEL
ALT: 8 U/L (ref 0–44)
AST: 12 U/L — ABNORMAL LOW (ref 15–41)
Albumin: 2.1 g/dL — ABNORMAL LOW (ref 3.5–5.0)
Alkaline Phosphatase: 74 U/L (ref 38–126)
Anion gap: 5 (ref 5–15)
BUN: 35 mg/dL — ABNORMAL HIGH (ref 8–23)
CO2: 23 mmol/L (ref 22–32)
Calcium: 7.9 mg/dL — ABNORMAL LOW (ref 8.9–10.3)
Chloride: 107 mmol/L (ref 98–111)
Creatinine, Ser: 1.84 mg/dL — ABNORMAL HIGH (ref 0.44–1.00)
GFR, Estimated: 30 mL/min — ABNORMAL LOW (ref >60)
Glucose, Bld: 232 mg/dL — ABNORMAL HIGH (ref 70–99)
Potassium: 5.4 mmol/L — ABNORMAL HIGH (ref 3.5–5.1)
Sodium: 135 mmol/L (ref 135–145)
Total Bilirubin: 0.5 mg/dL (ref 0.0–1.2)
Total Protein: 5.9 g/dL — ABNORMAL LOW (ref 6.5–8.1)

## 2023-07-19 LAB — RESPIRATORY PANEL BY PCR

## 2023-07-19 LAB — URINALYSIS, W/ REFLEX TO CULTURE (INFECTION SUSPECTED)
Bilirubin Urine: NEGATIVE
Glucose, UA: >=500 mg/dL — AB
Hgb urine dipstick: NEGATIVE
Ketones, ur: NEGATIVE mg/dL
Leukocytes,Ua: NEGATIVE
Nitrite: NEGATIVE
Protein, ur: NEGATIVE mg/dL
Specific Gravity, Urine: 1.021 (ref 1.005–1.030)
pH: 5 (ref 5.0–8.0)

## 2023-07-19 LAB — HEMOGLOBIN A1C
Hgb A1c MFr Bld: 7.5 % — ABNORMAL HIGH (ref 4.8–5.6)
Mean Plasma Glucose: 168.55 mg/dL

## 2023-07-19 LAB — GLUCOSE, CAPILLARY
Glucose-Capillary: 270 mg/dL — ABNORMAL HIGH (ref 70–99)
Glucose-Capillary: 239 mg/dL — ABNORMAL HIGH (ref 70–99)

## 2023-07-19 LAB — CBG MONITORING, ED
Glucose-Capillary: 283 mg/dL — ABNORMAL HIGH (ref 70–99)
Glucose-Capillary: 248 mg/dL — ABNORMAL HIGH (ref 70–99)

## 2023-07-19 LAB — PROCALCITONIN: Procalcitonin: 0.19 ng/mL

## 2023-07-19 MED ORDER — INSULIN GLARGINE 100 UNIT/ML ~~LOC~~ SOLN
20.0000 [IU] | Freq: Every day | SUBCUTANEOUS | Status: DC
  Administered 2023-07-19: 20 [IU] via SUBCUTANEOUS
  Filled 2023-07-19 (×2): qty 0.2

## 2023-07-19 MED ORDER — SODIUM ZIRCONIUM CYCLOSILICATE 10 G PO PACK
10.0000 g | PACK | Freq: Once | ORAL | Status: AC
  Administered 2023-07-19: 10 g via ORAL
  Filled 2023-07-19: qty 1

## 2023-07-19 NOTE — Progress Notes (Signed)
 PROGRESS NOTE    Kaitlin Cantrell  ZOX:[id number] DOB: [birth year] DOA: 07/18/2023 PCP: [name], MD   Brief Narrative:  HPI: Kaitlin Cantrell is a 68 y.o. female with medical history significant of hypertension, hyperlipidemia, diabetes, CKD 3, paroxysmal atrial fibrillation, chronic combined systolic and diastolic CHF, COPD, pulmonary embolism, obesity, chronic pain, anemia, RLS presenting with worsening shortness of breath.   Patient reports some congestion and shortness of breath for the past 2 to 3 days.  Has also had an associated cough.  Shortness of breath has been progressively getting worse.  Did not check for fever at home.  Did have some subjective warmth and chills though.  States that she ran out of her Atrovent at home.   Denies chest pain, abdominal pain, constipation, diarrhea, nausea, vomiting.   ED Course: Vital signs in the ED notable for blood pressure in the 70s to 110 systolic, respiratory rate in the 20s, requiring 2 L to maintain saturations.  Lab workup included CMP with sodium 134, BUN 32, creatinine elevated to 2.07 from baseline 1.5, glucose 211, calcium 8.7, albumin 2.6.  CBC with leukocytosis to 17.6.  PT and INR elevated at 32.2 and 3.1 respectively.  Lactic acid normal with repeat pending.  Respiratory panel for flu COVID and RSV negative.  Urinalysis and blood cultures pending.  Chest x-ray with chronic central airway thickening and mild pulmonary hyperinflation but no acute abnormality noted.  Patient received Tylenol, ceftriaxone, azithromycin, prednisone, Atrovent in the ED.   Patient initially received 500 cc IV fluids in the setting of CHF history.  However patient became hypotensive to the 70s systolic and 2 L have been ordered for bolus and administered via pressure bag.  Blood pressure has improved but will monitor in the ED before bringing patient up to the floor to ensure she does not need pressor support.  Assessment & Plan:   Principal  Problem:   Acute respiratory failure with hypoxia (HCC) Active Problems:   Hypertension associated with type 2 diabetes mellitus (HCC)   Type 2 diabetes mellitus without complication, without long-term current use of insulin (HCC)   Hyperlipidemia associated with type 2 diabetes mellitus (HCC)   Chronic pain syndrome   Chronic obstructive pulmonary disease (HCC)   Pulmonary thromboembolism (HCC)   Combined systolic and diastolic congestive heart failure (HCC)   Paroxysmal atrial fibrillation (HCC)   BMI 40.0-44.9, adult (HCC)   Acute renal failure superimposed on stage 3b chronic kidney disease (HCC)   Normocytic anemia   Restless leg  Severe sepsis and acute respiratory failure with hypoxia secondary to acute COPD exacerbation and presumed community-acquired pneumonia, POA: Patient meets sepsis criteria with leukocytosis to 17.6, tachypnea, hypoxia, hypotension with presumed source of pneumonia.Initial x-ray read shows chronic changes but pulmonary complaints are patient's only complaints x-ray is somewhat abnormal, could be difficult to tell if any acute on chronic change.  Procalcitonin unremarkable argues against pneumonia and she has chronic leukocytosis as well. No other source of infection have been identified.  She has not clear yet.  UA unremarkable.  Could be viral pneumonia, will check respiratory viral panel.  Has been ruled out of RSV, flu and COVID already.  Lactic acid normal.  She was started on Rocephin and Zithromax which I am going to continue for now.  Will also continue prednisone, bronchodilators for COPD exacerbation treatment.  Will consider CT chest if she does not improve by tomorrow.   AKI on CKD 3B Baseline creatinine appears to be  between 1.4-1.7, presented with 2.07, improved to 1.84, close to baseline.  This was likely due to ATN/hypotension which has now improved.  Avoid nephrotoxic agents.    History of hypertension but had hypotension in the ED: Received IV  fluids, antihypertensives held, blood pressure improving.  Monitor off of antihypertensives for now.   Hyperlipidemia - Continue home atorvastatin   Diabetes melitis type II: Last hemoglobin A1c in chart from 4 years ago.  Repeat hemoglobin A1c.  Appears to be taking 20 units of Lantus in the morning and 15 at night.  Was only given 5 units last night, significantly hyperglycemic in 200 range.  Will bump back to 20 units Lantus this morning and hold nightly dose for now.  Continue SSI.   Paroxysmal atrial fibrillation/history of PE - Holding metoprolol as above - Continue home Xarelto.  Rates controlled.   Chronic combined systolic and diastolic CHF > Last echo in 2024 showed EF 60-65%, normal diastolic function, normal RV function. -BNP better than baseline this time.  Holding Entresto, metoprolol, Lasix as above. - I's and O's, daily weights   Hyperkalemia: 5.4. will give lokelma and check later and tomorrow.   DVT prophylaxis: rivaroxaban (XARELTO) tablet 20 mg Start: 07/19/23 1000   Code Status: Full Code  Family Communication:  None present at bedside.  Plan of care discussed with patient in length and he/she verbalized understanding and agreed with it.  Status is: Inpatient Remains inpatient appropriate because: Still hypoxic   Estimated body mass index is 40.99 kg/m as calculated from the following:   Height as of this encounter: 5\' 6"  (1.676 m).   Weight as of this encounter: 115.2 kg.    Nutritional Assessment: Body mass index is 40.99 kg/m.Marland Kitchen Seen by dietician.  I agree with the assessment and plan as outlined below: Nutrition Status:        . Skin Assessment: I have examined the patient's skin and I agree with the wound assessment as performed by the wound care RN as outlined below:    Consultants:  None  Procedures:  None  Antimicrobials:  Anti-infectives (From admission, onward)    Start     Dose/Rate Route Frequency Ordered Stop   07/19/23 1500   azithromycin (ZITHROMAX) 500 mg in sodium chloride 0.9 % 250 mL IVPB        500 mg 250 mL/hr over 60 Minutes Intravenous Every 24 hours 07/18/23 1921     07/19/23 1500  cefTRIAXone (ROCEPHIN) 1 g in sodium chloride 0.9 % 100 mL IVPB        1 g 200 mL/hr over 30 Minutes Intravenous Every 24 hours 07/18/23 1921     07/18/23 1430  cefTRIAXone (ROCEPHIN) 1 g in sodium chloride 0.9 % 100 mL IVPB        1 g 200 mL/hr over 30 Minutes Intravenous  Once 07/18/23 1428 07/18/23 1515   07/18/23 1430  azithromycin (ZITHROMAX) 500 mg in sodium chloride 0.9 % 250 mL IVPB        500 mg 250 mL/hr over 60 Minutes Intravenous  Once 07/18/23 1428 07/18/23 1559         Subjective: Patient seen and examined, still with shortness of breath but better than yesterday.  No other complaint.  Objective: Vitals:   07/19/23 0615 07/19/23 0630 07/19/23 0644 07/19/23 0700  BP: (!) 124/59 115/67  130/74  Pulse: 76 76 74 69  Resp: (!) 21 20 18 17   Temp: 98 F (36.7 C)  TempSrc: Oral     SpO2: 96% 93% 95% 95%  Weight:      Height:        Intake/Output Summary (Last 24 hours) at 07/19/2023 0837 Last data filed at 07/18/2023 1745 Gross per 24 hour  Intake 3150 ml  Output --  Net 3150 ml   Filed Weights   07/18/23 1242  Weight: 115.2 kg    Examination:  General exam: Appears calm and comfortable  Respiratory system: Coarse breath sounds with minimal wheezes. Respiratory effort normal. Cardiovascular system: S1 & S2 heard, RRR. No JVD, murmurs, rubs, gallops or clicks. No pedal edema. Gastrointestinal system: Abdomen is nondistended, soft and nontender. No organomegaly or masses felt. Normal bowel sounds heard. Central nervous system: Alert and oriented. No focal neurological deficits. Extremities: Symmetric 5 x 5 power. Skin: No rashes, lesions or ulcers Psychiatry: Judgement and insight appear normal. Mood & affect appropriate.    Data Reviewed: I have personally reviewed following labs and  imaging studies  CBC: Recent Labs  Lab 07/18/23 1245 07/19/23 0635  WBC 17.6* 15.8*  NEUTROABS 14.6*  --   HGB 12.0 10.9*  HCT 39.0 36.4  MCV 92.9 95.8  PLT 314 268   Basic Metabolic Panel: Recent Labs  Lab 07/18/23 1245 07/19/23 0635  NA 134* 135  K 4.5 5.4*  CL 104 107  CO2 25 23  GLUCOSE 211* 232*  BUN 32* 35*  CREATININE 2.07* 1.84*  CALCIUM 8.7* 7.9*   GFR: Estimated Creatinine Clearance: 39.3 mL/min (A) (by C-G formula based on SCr of 1.84 mg/dL (H)). Liver Function Tests: Recent Labs  Lab 07/18/23 1245 07/19/23 0635  AST 20 12*  ALT 9 8  ALKPHOS 78 74  BILITOT 0.6 0.5  PROT 6.9 5.9*  ALBUMIN 2.6* 2.1*   No results for input(s): "LIPASE", "AMYLASE" in the last 168 hours. No results for input(s): "AMMONIA" in the last 168 hours. Coagulation Profile: Recent Labs  Lab 07/18/23 1245 07/18/23 2050  INR 3.1* 2.9*   Cardiac Enzymes: No results for input(s): "CKTOTAL", "CKMB", "CKMBINDEX", "TROPONINI" in the last 168 hours. BNP (last 3 results) No results for input(s): "PROBNP" in the last 8760 hours. HbA1C: No results for input(s): "HGBA1C" in the last 72 hours. CBG: Recent Labs  Lab 07/18/23 2043 07/19/23 0753  GLUCAP 148* 283*   Lipid Profile: No results for input(s): "CHOL", "HDL", "LDLCALC", "TRIG", "CHOLHDL", "LDLDIRECT" in the last 72 hours. Thyroid Function Tests: No results for input(s): "TSH", "T4TOTAL", "FREET4", "T3FREE", "THYROIDAB" in the last 72 hours. Anemia Panel: No results for input(s): "VITAMINB12", "FOLATE", "FERRITIN", "TIBC", "IRON", "RETICCTPCT" in the last 72 hours. Sepsis Labs: Recent Labs  Lab 07/18/23 1312 07/18/23 2050 07/18/23 2206 07/19/23 0635  PROCALCITON  --  0.14  --  0.19  LATICACIDVEN 1.5  --  1.1  --     Recent Results (from the past 240 hours)  Culture, blood (Routine x 2)     Status: None (Preliminary result)   Collection Time: 07/18/23  1:05 PM   Specimen: BLOOD  Result Value Ref Range Status    Specimen Description BLOOD LEFT ANTECUBITAL  Final   Special Requests   Final    BOTTLES DRAWN AEROBIC AND ANAEROBIC Blood Culture adequate volume   Culture   Final    NO GROWTH < 24 HOURS Performed at [facility], [address], [place], [place] [address]    Report Status PENDING  Incomplete  Resp panel by RT-PCR (RSV, Flu A&B, Covid)  Anterior Nasal Swab     Status: None   Collection Time: 07/18/23  3:00 PM   Specimen: Anterior Nasal Swab  Result Value Ref Range Status   SARS Coronavirus 2 by RT PCR NEGATIVE NEGATIVE Final   Influenza A by PCR NEGATIVE NEGATIVE Final   Influenza B by PCR NEGATIVE NEGATIVE Final    Comment: (NOTE) The Xpert Xpress SARS-CoV-2/FLU/RSV plus assay is intended as an aid in the diagnosis of influenza from Nasopharyngeal swab specimens and should not be used as a sole basis for treatment. Nasal washings and aspirates are unacceptable for Xpert Xpress SARS-CoV-2/FLU/RSV testing.  Fact Sheet for Patients: BloggerCourse.com  Fact Sheet for Healthcare Providers: SeriousBroker.it  This test is not yet approved or cleared by the [place] FDA and has been authorized for detection and/or diagnosis of SARS-CoV-2 by FDA under an Emergency Use Authorization (EUA). This EUA will remain in effect (meaning this test can be used) for the duration of the COVID-19 declaration under Section 564(b)(1) of the Act, 21 U.S.C. section 360bbb-3(b)(1), unless the authorization is terminated or revoked.     Resp Syncytial Virus by PCR NEGATIVE NEGATIVE Final    Comment: (NOTE) Fact Sheet for Patients: BloggerCourse.com  Fact Sheet for Healthcare Providers: SeriousBroker.it  This test is not yet approved or cleared by the [place] FDA and has been authorized for detection and/or diagnosis of SARS-CoV-2 by FDA under an Emergency Use Authorization (EUA).  This EUA will remain in effect (meaning this test can be used) for the duration of the COVID-19 declaration under Section 564(b)(1) of the Act, 21 U.S.C. section 360bbb-3(b)(1), unless the authorization is terminated or revoked.  Performed at [facility], [address], [place], [place] [address]   Culture, blood (Routine x 2)     Status: None (Preliminary result)   Collection Time: 07/18/23  8:50 PM   Specimen: BLOOD  Result Value Ref Range Status   Specimen Description BLOOD SITE NOT SPECIFIED  Final   Special Requests   Final    BOTTLES DRAWN AEROBIC AND ANAEROBIC Blood Culture results may not be optimal due to an inadequate volume of blood received in culture bottles   Culture   Final    NO GROWTH < 12 HOURS Performed at [facility], [address] [place], [place] [address]    Report Status PENDING  Incomplete     Radiology Studies: DG Chest 2 View Result Date: 07/18/2023 CLINICAL DATA:  Shortness of breath.  Suspected sepsis. EXAM: CHEST - 2 VIEW COMPARISON:  CT 07/10/2023.  Radiographs 02/23/2023 and 02/20/2023. FINDINGS: The heart size and mediastinal contours are stable. Chronic central airway thickening with mild pulmonary hyperinflation. No confluent airspace disease, pleural effusion or pneumothorax. The bones appear unremarkable. Telemetry leads overlie the chest. IMPRESSION: Chronic central airway thickening and mild pulmonary hyperinflation. No evidence of acute cardiopulmonary process. Electronically Signed   By: Carey Bullocks M.D.   On: 07/18/2023 15:31    Scheduled Meds:  atorvastatin  80 mg Oral q1800   insulin aspart  0-15 Units Subcutaneous TID WC   insulin aspart  0-5 Units Subcutaneous QHS   insulin glargine  5 Units Subcutaneous QHS   ipratropium  0.5 mg Nebulization Q6H   mometasone-formoterol  2 puff Inhalation BID   pantoprazole  40 mg Oral Daily   predniSONE  40 mg Oral Q breakfast   rivaroxaban  20 mg Oral Daily   sodium chloride  flush  3 mL Intravenous Q12H  sodium zirconium cyclosilicate  10 g Oral Once   Continuous Infusions:  azithromycin     cefTRIAXone (ROCEPHIN)  IV     lactated ringers 75 mL/hr at 07/19/23 0828     LOS: 1 day   Hughie Closs, MD [facility]  07/19/2023, 8:37 AM   *Please note that this is a verbal dictation therefore any spelling or grammatical errors are due to the "Dragon Medical One" system interpretation.  Please page via Amion and do not message via secure chat for urgent patient care matters. Secure chat can be used for non urgent patient care matters.  How to contact the [facility] Attending or Consulting provider 7A - 7P or covering provider during after hours 7P -7A, for this patient?  Check the care team in [facility] and look for a) attending/consulting [facility] provider listed and b) the [facility] team listed. Page or secure chat 7A-7P. Log into [email or web address] and use [place]'s universal password to access. If you do not have the password, please contact the hospital operator. Locate the [facility] provider you are looking for under [facility] and page to a number that you can be directly reached. If you still have difficulty reaching the provider, please page the [facility] (Director on Call) for the Hospitalists listed on amion for assistance.

## 2023-07-19 NOTE — Plan of Care (Signed)
  Problem: Respiratory: Goal: Ability to maintain adequate ventilation will improve Outcome: Progressing   Problem: Skin Integrity: Goal: Risk for impaired skin integrity will decrease Outcome: Progressing   Problem: Activity: Goal: Ability to tolerate increased activity will improve Outcome: Progressing   Problem: Respiratory: Goal: Ability to maintain a clear airway will improve Outcome: Progressing

## 2023-07-20 DIAGNOSIS — J9601 Acute respiratory failure with hypoxia: Secondary | ICD-10-CM | POA: Diagnosis not present

## 2023-07-20 LAB — BASIC METABOLIC PANEL
Anion gap: 8 (ref 5–15)
BUN: 41 mg/dL — ABNORMAL HIGH (ref 8–23)
CO2: 22 mmol/L (ref 22–32)
Calcium: 8.6 mg/dL — ABNORMAL LOW (ref 8.9–10.3)
Chloride: 104 mmol/L (ref 98–111)
Creatinine, Ser: 1.71 mg/dL — ABNORMAL HIGH (ref 0.44–1.00)
GFR, Estimated: 33 mL/min — ABNORMAL LOW (ref >60)
Glucose, Bld: 160 mg/dL — ABNORMAL HIGH (ref 70–99)
Potassium: 5.1 mmol/L (ref 3.5–5.1)
Sodium: 134 mmol/L — ABNORMAL LOW (ref 135–145)

## 2023-07-20 LAB — CBC WITH DIFFERENTIAL/PLATELET
Abs Immature Granulocytes: 0.13 10*3/uL — ABNORMAL HIGH (ref 0.00–0.07)
Basophils Absolute: 0 10*3/uL (ref 0.0–0.1)
Basophils Relative: 0 %
Eosinophils Absolute: 0 10*3/uL (ref 0.0–0.5)
Eosinophils Relative: 0 %
HCT: 36.6 % (ref 36.0–46.0)
Hemoglobin: 11.2 g/dL — ABNORMAL LOW (ref 12.0–15.0)
Immature Granulocytes: 1 %
Lymphocytes Relative: 8 %
Lymphs Abs: 1.5 10*3/uL (ref 0.7–4.0)
MCH: 28.6 pg (ref 26.0–34.0)
MCHC: 30.6 g/dL (ref 30.0–36.0)
MCV: 93.4 fL (ref 80.0–100.0)
Monocytes Absolute: 1.1 10*3/uL — ABNORMAL HIGH (ref 0.1–1.0)
Monocytes Relative: 6 %
Neutro Abs: 16.4 10*3/uL — ABNORMAL HIGH (ref 1.7–7.7)
Neutrophils Relative %: 85 %
Platelets: 278 10*3/uL (ref 150–400)
RBC: 3.92 MIL/uL (ref 3.87–5.11)
RDW: 14.3 % (ref 11.5–15.5)
WBC: 19.1 10*3/uL — ABNORMAL HIGH (ref 4.0–10.5)
nRBC: 0 % (ref 0.0–0.2)

## 2023-07-20 LAB — GLUCOSE, CAPILLARY
Glucose-Capillary: 135 mg/dL — ABNORMAL HIGH (ref 70–99)
Glucose-Capillary: 230 mg/dL — ABNORMAL HIGH (ref 70–99)
Glucose-Capillary: 282 mg/dL — ABNORMAL HIGH (ref 70–99)
Glucose-Capillary: 225 mg/dL — ABNORMAL HIGH (ref 70–99)

## 2023-07-20 LAB — STREP PNEUMONIAE URINARY ANTIGEN: Strep Pneumo Urinary Antigen: NEGATIVE

## 2023-07-20 MED ORDER — NICOTINE 21 MG/24HR TD PT24
21.0000 mg | MEDICATED_PATCH | Freq: Every day | TRANSDERMAL | Status: DC
Start: 2023-07-20 — End: 2023-07-23
  Administered 2023-07-20 – 2023-07-23 (×4): 21 mg via TRANSDERMAL
  Filled 2023-07-20 (×4): qty 1

## 2023-07-20 MED ORDER — PREGABALIN 75 MG PO CAPS
75.0000 mg | ORAL_CAPSULE | Freq: Three times a day (TID) | ORAL | Status: DC
  Administered 2023-07-20 – 2023-07-23 (×10): 75 mg via ORAL
  Filled 2023-07-20 (×10): qty 1

## 2023-07-20 MED ORDER — INSULIN GLARGINE 100 UNIT/ML ~~LOC~~ SOLN
25.0000 [IU] | Freq: Every day | SUBCUTANEOUS | Status: DC
  Administered 2023-07-20: 25 [IU] via SUBCUTANEOUS
  Filled 2023-07-20 (×2): qty 0.25

## 2023-07-20 MED ORDER — METOPROLOL SUCCINATE ER 50 MG PO TB24
100.0000 mg | ORAL_TABLET | Freq: Every day | ORAL | Status: DC
  Administered 2023-07-20 – 2023-07-23 (×4): 100 mg via ORAL
  Filled 2023-07-20 (×4): qty 2

## 2023-07-20 NOTE — Progress Notes (Signed)
 PROGRESS NOTE    [name]  WUJ:[id number] DOB: [birth year] DOA: 07/18/2023 PCP: [name], MD   Brief Narrative:  Kaitlin Cantrell is a 68 y.o. female with medical history significant of hypertension, hyperlipidemia, diabetes, CKD 3b, paroxysmal atrial fibrillation, chronic combined systolic and diastolic CHF, COPD, pulmonary embolism, obesity, chronic pain, anemia, current smoker, RLS presenting with worsening shortness of breath for 2 to 3 days.  Upon arrival to ED, she was hemodynamically stable, was diagnosed with severe sepsis and acute hypoxic respiratory failure secondary to community-acquired pneumonia and acute COPD exacerbation as well as AKI on CKD stage IIIb.  Admitted under hospital service.  Details below.  Assessment & Plan:   Principal Problem:   Acute respiratory failure with hypoxia (HCC) Active Problems:   Hypertension associated with type 2 diabetes mellitus (HCC)   Type 2 diabetes mellitus without complication, without long-term current use of insulin (HCC)   Hyperlipidemia associated with type 2 diabetes mellitus (HCC)   Chronic pain syndrome   Chronic obstructive pulmonary disease (HCC)   Pulmonary thromboembolism (HCC)   Combined systolic and diastolic congestive heart failure (HCC)   Paroxysmal atrial fibrillation (HCC)   BMI 40.0-44.9, adult (HCC)   Acute renal failure superimposed on stage 3b chronic kidney disease (HCC)   Normocytic anemia   Restless leg  Severe sepsis and acute respiratory failure with hypoxia secondary to acute COPD exacerbation and presumed community-acquired pneumonia, POA: Patient meets sepsis criteria with leukocytosis to 17.6, tachypnea, hypoxia, hypotension with presumed source as pneumonia.Initial x-ray read shows chronic changes, could be difficult to tell if any acute on chronic change.  Procalcitonin unremarkable argues against pneumonia and she has chronic leukocytosis as well. UA unremarkable. Has been ruled out  of RSV, flu and COVID already and respiratory viral panel negative as well.  Lactic acid normal.  Symptomatically improving.  However clinically she does appear to have pneumonia.  She was started on Rocephin and Zithromax which I am going to continue for now.  Will also continue prednisone, bronchodilators for COPD exacerbation treatment.  Try to wean as able to.  Discussed with RN, will provide incentive spirometry and educate and I personally encouraged her to use every hour when awake.   AKI on CKD 3B Baseline creatinine appears to be between 1.4-1.7, presented with 2.07, improved to 1.7 today, close to baseline.  This was likely due to ATN/hypotension which has now improved.  Avoid nephrotoxic agents.    History of hypertension but had hypotension in the ED: Received IV fluids, antihypertensives held, blood pressure improving.  Monitor off of antihypertensives for now.   Hyperlipidemia - Continue home atorvastatin   Diabetes melitis type II: Last hemoglobin A1c in chart from 4 years ago.  Repeat hemoglobin A1c.  Appears to be taking 20 units of Lantus in the morning and 15 at night.  Started on 20 units of Lantus in the morning, slightly hyperglycemic, will increase that to 25 units and continue SSI.    Paroxysmal atrial fibrillation/history of PE - Continue home Xarelto.  Rates controlled.  Toprol on hold due to low blood pressure but blood pressure is improving so we will resume Toprol-XL back in order to avoid RVR.   Chronic combined systolic and diastolic CHF > Last echo in 2024 showed EF 60-65%, normal diastolic function, normal RV function. -BNP better than baseline this time.  Holding Entresto and Lasix as above. - I's and O's, daily weights   Hyperkalemia: Improved from 5.4 yesterday to 5.1  today with [facility] however she is still at high borderline so I will give another dose of Lokelma today.  Current smoker/tobacco dependence: I have discussed tobacco cessation with the  patient.  I have counseled the patient regarding the negative impacts of continued tobacco use including but not limited to lung cancer, COPD, and cardiovascular disease.  I have discussed alternatives to tobacco and modalities that may help facilitate tobacco cessation including but not limited to biofeedback, hypnosis, and medications.  Total time spent with tobacco counseling was 5 minutes.   DVT prophylaxis:    Code Status: Full Code  Family Communication:  husband present at bedside.  Plan of care discussed with patient in length and he/she verbalized understanding and agreed with it.  Status is: Inpatient Remains inpatient appropriate because: Still hypoxic and symptomatic.   Estimated body mass index is 42.56 kg/m as calculated from the following:   Height as of this encounter: 5\' 6"  (1.676 m).   Weight as of this encounter: 119.6 kg.    Nutritional Assessment: Body mass index is 42.56 kg/m.Marland Kitchen Seen by dietician.  I agree with the assessment and plan as outlined below: Nutrition Status:        . Skin Assessment: I have examined the patient's skin and I agree with the wound assessment as performed by the wound care RN as outlined below:    Consultants:  None  Procedures:  None  Antimicrobials:  Anti-infectives (From admission, onward)    Start     Dose/Rate Route Frequency Ordered Stop   07/19/23 1500  azithromycin (ZITHROMAX) 500 mg in sodium chloride 0.9 % 250 mL IVPB        500 mg 250 mL/hr over 60 Minutes Intravenous Every 24 hours 07/18/23 1921     07/19/23 1500  cefTRIAXone (ROCEPHIN) 1 g in sodium chloride 0.9 % 100 mL IVPB        1 g 200 mL/hr over 30 Minutes Intravenous Every 24 hours 07/18/23 1921     07/18/23 1430  cefTRIAXone (ROCEPHIN) 1 g in sodium chloride 0.9 % 100 mL IVPB        1 g 200 mL/hr over 30 Minutes Intravenous  Once 07/18/23 1428 07/18/23 1515   07/18/23 1430  azithromycin (ZITHROMAX) 500 mg in sodium chloride 0.9 % 250 mL IVPB         500 mg 250 mL/hr over 60 Minutes Intravenous  Once 07/18/23 1428 07/18/23 1559         Subjective: Patient seen and remained.  Husband the bedside.  Patient still complains of shortness of breath with no improvement compared to yesterday.  She had constant coughing during my evaluation.  Objective: Vitals:   07/19/23 2325 07/20/23 0335 07/20/23 0500 07/20/23 0730  BP: 109/66 122/71  132/65  Pulse: 79 88  80  Resp: 18 16  15   Temp: 97.7 F (36.5 C) 97.7 F (36.5 C)  97.6 F (36.4 C)  TempSrc: Oral Oral  Oral  SpO2: 94% 96%  96%  Weight:   119.6 kg   Height:        Intake/Output Summary (Last 24 hours) at 07/20/2023 0843 Last data filed at 07/19/2023 1900 Gross per 24 hour  Intake 2445.79 ml  Output --  Net 2445.79 ml   Filed Weights   07/18/23 1242 07/20/23 0500  Weight: 115.2 kg 119.6 kg    Examination:  General exam: Coughing and appears uncomfortable due to shortness of breath. Respiratory system: Coarse breath sounds bilaterally with expiratory  wheezes diffusely, no crackles or rhonchi. Respiratory effort normal. Cardiovascular system: S1 & S2 heard, RRR. No JVD, murmurs, rubs, gallops or clicks. No pedal edema. Gastrointestinal system: Abdomen is nondistended, soft and nontender. No organomegaly or masses felt. Normal bowel sounds heard. Central nervous system: Alert and oriented. No focal neurological deficits. Extremities: Symmetric 5 x 5 power. Skin: No rashes, lesions or ulcers.  Psychiatry: Judgement and insight appear normal. Mood & affect appropriate.   Data Reviewed: I have personally reviewed following labs and imaging studies  CBC: Recent Labs  Lab 07/18/23 1245 07/19/23 0635 07/20/23 0504  WBC 17.6* 15.8* 19.1*  NEUTROABS 14.6*  --  16.4*  HGB 12.0 10.9* 11.2*  HCT 39.0 36.4 36.6  MCV 92.9 95.8 93.4  PLT 314 268 278   Basic Metabolic Panel: Recent Labs  Lab 07/18/23 1245 07/19/23 0635 07/20/23 0504  NA 134* 135 134*  K 4.5 5.4* 5.1   CL 104 107 104  CO2 25 23 22   GLUCOSE 211* 232* 160*  BUN 32* 35* 41*  CREATININE 2.07* 1.84* 1.71*  CALCIUM 8.7* 7.9* 8.6*   GFR: Estimated Creatinine Clearance: 43.2 mL/min (A) (by C-G formula based on SCr of 1.71 mg/dL (H)). Liver Function Tests: Recent Labs  Lab 07/18/23 1245 07/19/23 0635  AST 20 12*  ALT 9 8  ALKPHOS 78 74  BILITOT 0.6 0.5  PROT 6.9 5.9*  ALBUMIN 2.6* 2.1*   No results for input(s): "LIPASE", "AMYLASE" in the last 168 hours. No results for input(s): "AMMONIA" in the last 168 hours. Coagulation Profile: Recent Labs  Lab 07/18/23 1245 07/18/23 2050  INR 3.1* 2.9*   Cardiac Enzymes: No results for input(s): "CKTOTAL", "CKMB", "CKMBINDEX", "TROPONINI" in the last 168 hours. BNP (last 3 results) No results for input(s): "PROBNP" in the last 8760 hours. HbA1C: Recent Labs    07/19/23 0635  HGBA1C 7.5*   CBG: Recent Labs  Lab 07/19/23 0753 07/19/23 1151 07/19/23 1617 07/19/23 2154 07/20/23 0728  GLUCAP 283* 248* 270* 239* 135*   Lipid Profile: No results for input(s): "CHOL", "HDL", "LDLCALC", "TRIG", "CHOLHDL", "LDLDIRECT" in the last 72 hours. Thyroid Function Tests: No results for input(s): "TSH", "T4TOTAL", "FREET4", "T3FREE", "THYROIDAB" in the last 72 hours. Anemia Panel: No results for input(s): "VITAMINB12", "FOLATE", "FERRITIN", "TIBC", "IRON", "RETICCTPCT" in the last 72 hours. Sepsis Labs: Recent Labs  Lab 07/18/23 1312 07/18/23 2050 07/18/23 2206 07/19/23 0635  PROCALCITON  --  0.14  --  0.19  LATICACIDVEN 1.5  --  1.1  --     Recent Results (from the past 240 hours)  Culture, blood (Routine x 2)     Status: None (Preliminary result)   Collection Time: 07/18/23  1:05 PM   Specimen: BLOOD  Result Value Ref Range Status   Specimen Description BLOOD LEFT ANTECUBITAL  Final   Special Requests   Final    BOTTLES DRAWN AEROBIC AND ANAEROBIC Blood Culture adequate volume   Culture   Final    NO GROWTH 2 DAYS Performed  at [facility], [address] [place], [place] [address]    Report Status PENDING  Incomplete  Resp panel by RT-PCR (RSV, Flu A&B, Covid) Anterior Nasal Swab     Status: None   Collection Time: 07/18/23  3:00 PM   Specimen: Anterior Nasal Swab  Result Value Ref Range Status   SARS Coronavirus 2 by RT PCR NEGATIVE NEGATIVE Final   Influenza A by PCR NEGATIVE NEGATIVE Final   Influenza B  by PCR NEGATIVE NEGATIVE Final    Comment: (NOTE) The Xpert Xpress SARS-CoV-2/FLU/RSV plus assay is intended as an aid in the diagnosis of influenza from Nasopharyngeal swab specimens and should not be used as a sole basis for treatment. Nasal washings and aspirates are unacceptable for Xpert Xpress SARS-CoV-2/FLU/RSV testing.  Fact Sheet for Patients: BloggerCourse.com  Fact Sheet for Healthcare Providers: SeriousBroker.it  This test is not yet approved or cleared by the [place] FDA and has been authorized for detection and/or diagnosis of SARS-CoV-2 by FDA under an Emergency Use Authorization (EUA). This EUA will remain in effect (meaning this test can be used) for the duration of the COVID-19 declaration under Section 564(b)(1) of the Act, 21 U.S.C. section 360bbb-3(b)(1), unless the authorization is terminated or revoked.     Resp Syncytial Virus by PCR NEGATIVE NEGATIVE Final    Comment: (NOTE) Fact Sheet for Patients: BloggerCourse.com  Fact Sheet for Healthcare Providers: SeriousBroker.it  This test is not yet approved or cleared by the [place] FDA and has been authorized for detection and/or diagnosis of SARS-CoV-2 by FDA under an Emergency Use Authorization (EUA). This EUA will remain in effect (meaning this test can be used) for the duration of the COVID-19 declaration under Section 564(b)(1) of the Act, 21 U.S.C. section 360bbb-3(b)(1), unless the authorization  is terminated or revoked.  Performed at [facility], [address], [place], [place] [address]   Culture, blood (Routine x 2)     Status: None (Preliminary result)   Collection Time: 07/18/23  8:50 PM   Specimen: BLOOD  Result Value Ref Range Status   Specimen Description BLOOD SITE NOT SPECIFIED  Final   Special Requests   Final    BOTTLES DRAWN AEROBIC AND ANAEROBIC Blood Culture results may not be optimal due to an inadequate volume of blood received in culture bottles   Culture   Final    NO GROWTH 2 DAYS Performed at [facility], [address], [place], [place] [address]    Report Status PENDING  Incomplete  Respiratory (~20 pathogens) panel by PCR     Status: None   Collection Time: 07/19/23  9:18 AM   Specimen: Nasopharyngeal Swab; Respiratory  Result Value Ref Range Status   Adenovirus NOT DETECTED NOT DETECTED Final   Coronavirus 229E NOT DETECTED NOT DETECTED Final    Comment: (NOTE) The Coronavirus on the Respiratory Panel, DOES NOT test for the novel  Coronavirus (2019 nCoV)    Coronavirus HKU1 NOT DETECTED NOT DETECTED Final   Coronavirus NL63 NOT DETECTED NOT DETECTED Final   Coronavirus OC43 NOT DETECTED NOT DETECTED Final   Metapneumovirus NOT DETECTED NOT DETECTED Final   Rhinovirus / Enterovirus NOT DETECTED NOT DETECTED Final   Influenza A NOT DETECTED NOT DETECTED Final   Influenza B NOT DETECTED NOT DETECTED Final   Parainfluenza Virus 1 NOT DETECTED NOT DETECTED Final   Parainfluenza Virus 2 NOT DETECTED NOT DETECTED Final   Parainfluenza Virus 3 NOT DETECTED NOT DETECTED Final   Parainfluenza Virus 4 NOT DETECTED NOT DETECTED Final   Respiratory Syncytial Virus NOT DETECTED NOT DETECTED Final   Bordetella pertussis NOT DETECTED NOT DETECTED Final   Bordetella Parapertussis NOT DETECTED NOT DETECTED Final   Chlamydophila pneumoniae NOT DETECTED NOT DETECTED Final   Mycoplasma pneumoniae NOT DETECTED NOT DETECTED Final    Comment:  Performed at [facility], [address] [place], [place] [address]     Radiology Studies:  DG Chest 2 View Result Date: 07/18/2023 CLINICAL DATA:  Shortness of breath.  Suspected sepsis. EXAM: CHEST - 2 VIEW COMPARISON:  CT 07/10/2023.  Radiographs 02/23/2023 and 02/20/2023. FINDINGS: The heart size and mediastinal contours are stable. Chronic central airway thickening with mild pulmonary hyperinflation. No confluent airspace disease, pleural effusion or pneumothorax. The bones appear unremarkable. Telemetry leads overlie the chest. IMPRESSION: Chronic central airway thickening and mild pulmonary hyperinflation. No evidence of acute cardiopulmonary process. Electronically Signed   By: Carey Bullocks M.D.   On: 07/18/2023 15:31    Scheduled Meds:  atorvastatin  80 mg Oral q1800   insulin aspart  0-15 Units Subcutaneous TID WC   insulin aspart  0-5 Units Subcutaneous QHS   insulin glargine  25 Units Subcutaneous Daily   ipratropium  0.5 mg Nebulization Q6H   mometasone-formoterol  2 puff Inhalation BID   pantoprazole  40 mg Oral Daily   predniSONE  40 mg Oral Q breakfast   rivaroxaban  20 mg Oral Daily   sodium chloride flush  3 mL Intravenous Q12H   Continuous Infusions:  azithromycin 500 mg (07/19/23 1558)   cefTRIAXone (ROCEPHIN)  IV 1 g (07/19/23 1525)     LOS: 2 days   Hughie Closs, MD [facility]  07/20/2023, 8:43 AM   *Please note that this is a verbal dictation therefore any spelling or grammatical errors are due to the "Dragon Medical One" system interpretation.  Please page via Amion and do not message via secure chat for urgent patient care matters. Secure chat can be used for non urgent patient care matters.  How to contact the [facility] Attending or Consulting provider 7A - 7P or covering provider during after hours 7P -7A, for this patient?  Check the care team in [facility] and look for a) attending/consulting [facility] provider listed and b) the [facility] team listed. Page or  secure chat 7A-7P. Log into [email or web address] and use [place]'s universal password to access. If you do not have the password, please contact the hospital operator. Locate the [facility] provider you are looking for under [facility] and page to a number that you can be directly reached. If you still have difficulty reaching the provider, please page the [facility] (Director on Call) for the Hospitalists listed on amion for assistance.

## 2023-07-20 NOTE — Plan of Care (Signed)
  Problem: Fluid Volume: Goal: Hemodynamic stability will improve Outcome: Progressing   Problem: Clinical Measurements: Goal: Diagnostic test results will improve Outcome: Progressing Goal: Signs and symptoms of infection will decrease Outcome: Progressing   Problem: Respiratory: Goal: Ability to maintain adequate ventilation will improve Outcome: Progressing   Problem: Education: Goal: Ability to describe self-care measures that may prevent or decrease complications (Diabetes Survival Skills Education) will improve Outcome: Progressing Goal: Individualized Educational Video(s) Outcome: Progressing   Problem: Coping: Goal: Ability to adjust to condition or change in health will improve Outcome: Progressing   Problem: Fluid Volume: Goal: Ability to maintain a balanced intake and output will improve Outcome: Progressing   Problem: Health Behavior/Discharge Planning: Goal: Ability to identify and utilize available resources and services will improve Outcome: Progressing Goal: Ability to manage health-related needs will improve Outcome: Progressing   Problem: Metabolic: Goal: Ability to maintain appropriate glucose levels will improve Outcome: Progressing   Problem: Nutritional: Goal: Maintenance of adequate nutrition will improve Outcome: Progressing Goal: Progress toward achieving an optimal weight will improve Outcome: Progressing   Problem: Skin Integrity: Goal: Risk for impaired skin integrity will decrease Outcome: Progressing   Problem: Tissue Perfusion: Goal: Adequacy of tissue perfusion will improve Outcome: Progressing   Problem: Education: Goal: Knowledge of disease or condition will improve Outcome: Progressing Goal: Knowledge of the prescribed therapeutic regimen will improve Outcome: Progressing Goal: Individualized Educational Video(s) Outcome: Progressing   Problem: Activity: Goal: Ability to tolerate increased activity will improve Outcome:  Progressing Goal: Will verbalize the importance of balancing activity with adequate rest periods Outcome: Progressing   Problem: Respiratory: Goal: Ability to maintain a clear airway will improve Outcome: Progressing Goal: Levels of oxygenation will improve Outcome: Progressing Goal: Ability to maintain adequate ventilation will improve Outcome: Progressing   Problem: Activity: Goal: Ability to tolerate increased activity will improve Outcome: Progressing   Problem: Clinical Measurements: Goal: Ability to maintain a body temperature in the normal range will improve Outcome: Progressing   Problem: Respiratory: Goal: Ability to maintain adequate ventilation will improve Outcome: Progressing Goal: Ability to maintain a clear airway will improve Outcome: Progressing   Problem: Education: Goal: Knowledge of General Education information will improve Description: Including pain rating scale, medication(s)/side effects and non-pharmacologic comfort measures Outcome: Progressing   Problem: Health Behavior/Discharge Planning: Goal: Ability to manage health-related needs will improve Outcome: Progressing   Problem: Clinical Measurements: Goal: Ability to maintain clinical measurements within normal limits will improve Outcome: Progressing Goal: Will remain free from infection Outcome: Progressing Goal: Diagnostic test results will improve Outcome: Progressing Goal: Respiratory complications will improve Outcome: Progressing Goal: Cardiovascular complication will be avoided Outcome: Progressing   Problem: Activity: Goal: Risk for activity intolerance will decrease Outcome: Progressing   Problem: Nutrition: Goal: Adequate nutrition will be maintained Outcome: Progressing   Problem: Coping: Goal: Level of anxiety will decrease Outcome: Progressing   Problem: Elimination: Goal: Will not experience complications related to bowel motility Outcome: Progressing Goal: Will  not experience complications related to urinary retention Outcome: Progressing   Problem: Pain Managment: Goal: General experience of comfort will improve and/or be controlled Outcome: Progressing   Problem: Safety: Goal: Ability to remain free from injury will improve Outcome: Progressing   Problem: Skin Integrity: Goal: Risk for impaired skin integrity will decrease Outcome: Progressing

## 2023-07-20 NOTE — Plan of Care (Signed)
  Problem: Fluid Volume: Goal: Hemodynamic stability will improve Outcome: Progressing   Problem: Clinical Measurements: Goal: Diagnostic test results will improve Outcome: Progressing Goal: Signs and symptoms of infection will decrease Outcome: Progressing   Problem: Respiratory: Goal: Ability to maintain adequate ventilation will improve Outcome: Progressing   Problem: Education: Goal: Ability to describe self-care measures that may prevent or decrease complications (Diabetes Survival Skills Education) will improve Outcome: Progressing Goal: Individualized Educational Video(s) Outcome: Progressing

## 2023-07-21 DIAGNOSIS — J9601 Acute respiratory failure with hypoxia: Secondary | ICD-10-CM | POA: Diagnosis not present

## 2023-07-21 LAB — CBC WITH DIFFERENTIAL/PLATELET
Abs Immature Granulocytes: 0.18 10*3/uL — ABNORMAL HIGH (ref 0.00–0.07)
Basophils Absolute: 0 10*3/uL (ref 0.0–0.1)
Basophils Relative: 0 %
Eosinophils Absolute: 0 10*3/uL (ref 0.0–0.5)
Eosinophils Relative: 0 %
HCT: 35.1 % — ABNORMAL LOW (ref 36.0–46.0)
Hemoglobin: 11 g/dL — ABNORMAL LOW (ref 12.0–15.0)
Immature Granulocytes: 1 %
Lymphocytes Relative: 12 %
Lymphs Abs: 1.5 10*3/uL (ref 0.7–4.0)
MCH: 28.7 pg (ref 26.0–34.0)
MCHC: 31.3 g/dL (ref 30.0–36.0)
MCV: 91.6 fL (ref 80.0–100.0)
Monocytes Absolute: 0.7 10*3/uL (ref 0.1–1.0)
Monocytes Relative: 5 %
Neutro Abs: 10.3 10*3/uL — ABNORMAL HIGH (ref 1.7–7.7)
Neutrophils Relative %: 82 %
Platelets: 326 10*3/uL (ref 150–400)
RBC: 3.83 MIL/uL — ABNORMAL LOW (ref 3.87–5.11)
RDW: 14.3 % (ref 11.5–15.5)
WBC: 12.6 10*3/uL — ABNORMAL HIGH (ref 4.0–10.5)
nRBC: 0 % (ref 0.0–0.2)

## 2023-07-21 LAB — BASIC METABOLIC PANEL
Anion gap: 9 (ref 5–15)
BUN: 45 mg/dL — ABNORMAL HIGH (ref 8–23)
CO2: 22 mmol/L (ref 22–32)
Calcium: 8.9 mg/dL (ref 8.9–10.3)
Chloride: 104 mmol/L (ref 98–111)
Creatinine, Ser: 1.58 mg/dL — ABNORMAL HIGH (ref 0.44–1.00)
GFR, Estimated: 36 mL/min — ABNORMAL LOW (ref >60)
Glucose, Bld: 244 mg/dL — ABNORMAL HIGH (ref 70–99)
Potassium: 4.8 mmol/L (ref 3.5–5.1)
Sodium: 135 mmol/L (ref 135–145)

## 2023-07-21 LAB — GLUCOSE, CAPILLARY
Glucose-Capillary: 199 mg/dL — ABNORMAL HIGH (ref 70–99)
Glucose-Capillary: 229 mg/dL — ABNORMAL HIGH (ref 70–99)
Glucose-Capillary: 279 mg/dL — ABNORMAL HIGH (ref 70–99)
Glucose-Capillary: 273 mg/dL — ABNORMAL HIGH (ref 70–99)

## 2023-07-21 LAB — LEGIONELLA PNEUMOPHILA SEROGP 1 UR AG: L. pneumophila Serogp 1 Ur Ag: NEGATIVE

## 2023-07-21 MED ORDER — INSULIN GLARGINE 100 UNIT/ML ~~LOC~~ SOLN
30.0000 [IU] | Freq: Every day | SUBCUTANEOUS | Status: DC
  Administered 2023-07-21 – 2023-07-22 (×2): 30 [IU] via SUBCUTANEOUS
  Filled 2023-07-21 (×2): qty 0.3

## 2023-07-21 MED ORDER — SACUBITRIL-VALSARTAN 97-103 MG PO TABS
1.0000 | ORAL_TABLET | Freq: Two times a day (BID) | ORAL | Status: DC
  Administered 2023-07-21 – 2023-07-23 (×5): 1 via ORAL
  Filled 2023-07-21 (×6): qty 1

## 2023-07-21 NOTE — Progress Notes (Signed)
 PROGRESS NOTE    Kaitlin Cantrell  WUJ:[id number] DOB: [birth year] DOA: 07/18/2023 PCP: [name], MD   Brief Narrative:  Kaitlin Cantrell is a 68 y.o. female with medical history significant of hypertension, hyperlipidemia, diabetes, CKD 3b, paroxysmal atrial fibrillation, chronic combined systolic and diastolic CHF, COPD, pulmonary embolism, obesity, chronic pain, anemia, current smoker, RLS presenting with worsening shortness of breath for 2 to 3 days.  Upon arrival to ED, she was hemodynamically stable, was diagnosed with severe sepsis and acute hypoxic respiratory failure secondary to community-acquired pneumonia and acute COPD exacerbation as well as AKI on CKD stage IIIb.  Admitted under hospital service.  Details below.  Assessment & Plan:   Principal Problem:   Acute respiratory failure with hypoxia (HCC) Active Problems:   Hypertension associated with type 2 diabetes mellitus (HCC)   Type 2 diabetes mellitus without complication, without long-term current use of insulin (HCC)   Hyperlipidemia associated with type 2 diabetes mellitus (HCC)   Chronic pain syndrome   Chronic obstructive pulmonary disease (HCC)   Pulmonary thromboembolism (HCC)   Combined systolic and diastolic congestive heart failure (HCC)   Paroxysmal atrial fibrillation (HCC)   BMI 40.0-44.9, adult (HCC)   Acute renal failure superimposed on stage 3b chronic kidney disease (HCC)   Normocytic anemia   Restless leg  Severe sepsis and acute respiratory failure with hypoxia secondary to acute COPD exacerbation and presumed community-acquired pneumonia, POA: Patient meets sepsis criteria with leukocytosis to 17.6, tachypnea, hypoxia, hypotension with presumed source as pneumonia.Initial x-ray read shows chronic changes, could be difficult to tell if any acute on chronic change.  Procalcitonin unremarkable argues against pneumonia and she has chronic leukocytosis as well. UA unremarkable. Has been ruled out  of RSV, flu and COVID already and respiratory viral panel negative as well.  Lactic acid normal.  Symptomatically improving.  However clinically she does appear to have pneumonia.  She was started on Rocephin and Zithromax which I am going to continue for now.  Will also continue prednisone, bronchodilators for COPD exacerbation treatment.  Try to wean as able to.  Encouraged incentive hysterometry.  She was saturating 88-89% on room air with rest.  Try to wean down oxygen as much as we can.  We will check ambulatory oximetry.  Patient not in favor to go home with oxygen.  She would rather stay here and get off of the oxygen completely before going home.   AKI on CKD 3B Baseline creatinine appears to be between 1.4-1.7, presented with 2.07, improved to 1.6 today, close to baseline.  This was likely due to ATN/hypotension which has now improved.  Avoid nephrotoxic agents.  Will gradually start diuretics/heart failure medications, will start on Entresto today.   History of hypertension but had hypotension in the ED: Received IV fluids, antihypertensives held, blood pressure improving.  Monitor off of antihypertensives for now.   Hyperlipidemia - Continue home atorvastatin   Diabetes melitis type II: Last hemoglobin A1c in chart from 4 years ago.  Repeat hemoglobin A1c.  Appears to be taking 20 units of Lantus in the morning and 15 at night.  Currently on 25 units of Lantus in the morning, still hyperglycemic, will increase to 30 units today.  Continue SSI.   Paroxysmal atrial fibrillation/history of PE - Continue home Xarelto.  Rates controlled.  Toprol on hold due to low blood pressure but blood pressure is improving so we will resume Toprol-XL back in order to avoid RVR.   Chronic combined  systolic and diastolic CHF > Last echo in 2024 showed EF 60-65%, normal diastolic function, normal RV function. -BNP better than baseline this time.  Started on [place] today, reassess renal function tomorrow  and consider resuming Lasix tomorrow as well. - I's and O's, daily weights   Hyperkalemia: Resolved.  Current smoker/tobacco dependence: I have discussed tobacco cessation with the patient.  I have counseled the patient regarding the negative impacts of continued tobacco use including but not limited to lung cancer, COPD, and cardiovascular disease.  I have discussed alternatives to tobacco and modalities that may help facilitate tobacco cessation including but not limited to biofeedback, hypnosis, and medications.  Total time spent with tobacco counseling was 5 minutes.  DVT prophylaxis: Xarelto   Code Status: Full Code  Family Communication: None present at bedside.  Plan of care discussed with patient in length and he/she verbalized understanding and agreed with it.  Status is: Inpatient Remains inpatient appropriate because: Still hypoxic and symptomatic.   Estimated body mass index is 42.56 kg/m as calculated from the following:   Height as of this encounter: 5\' 6"  (1.676 m).   Weight as of this encounter: 119.6 kg.    Nutritional Assessment: Body mass index is 42.56 kg/m.Marland Kitchen Seen by dietician.  I agree with the assessment and plan as outlined below: Nutrition Status:        . Skin Assessment: I have examined the patient's skin and I agree with the wound assessment as performed by the wound care RN as outlined below:    Consultants:  None  Procedures:  None  Antimicrobials:  Anti-infectives (From admission, onward)    Start     Dose/Rate Route Frequency Ordered Stop   07/19/23 1500  azithromycin (ZITHROMAX) 500 mg in sodium chloride 0.9 % 250 mL IVPB        500 mg 250 mL/hr over 60 Minutes Intravenous Every 24 hours 07/18/23 1921     07/19/23 1500  cefTRIAXone (ROCEPHIN) 1 g in sodium chloride 0.9 % 100 mL IVPB        1 g 200 mL/hr over 30 Minutes Intravenous Every 24 hours 07/18/23 1921     07/18/23 1430  cefTRIAXone (ROCEPHIN) 1 g in sodium chloride 0.9 % 100  mL IVPB        1 g 200 mL/hr over 30 Minutes Intravenous  Once 07/18/23 1428 07/18/23 1515   07/18/23 1430  azithromycin (ZITHROMAX) 500 mg in sodium chloride 0.9 % 250 mL IVPB        500 mg 250 mL/hr over 60 Minutes Intravenous  Once 07/18/23 1428 07/18/23 1559         Subjective: Patient seen and examined.  Sitting the chair.  Feeling much better.  No other complaint.  Objective: Vitals:   07/20/23 2046 07/20/23 2304 07/21/23 0256 07/21/23 0733  BP:  138/64 137/62 127/63  Pulse:  60 72 64  Resp:    12  Temp:  97.7 F (36.5 C) 97.7 F (36.5 C) 97.8 F (36.6 C)  TempSrc:  Oral Oral Oral  SpO2: 99% 96% 98% 94%  Weight:      Height:       No intake or output data in the 24 hours ending 07/21/23 0755  Filed Weights   07/18/23 1242 07/20/23 0500  Weight: 115.2 kg 119.6 kg    Examination:  General exam: Appears calm and comfortable  Respiratory system: Diminished breath sounds but no wheezes or rhonchi today.  Much improved compared to yesterday.  Respiratory effort normal. Cardiovascular system: S1 & S2 heard, RRR. No JVD, murmurs, rubs, gallops or clicks. No pedal edema. Gastrointestinal system: Abdomen is nondistended, soft and nontender. No organomegaly or masses felt. Normal bowel sounds heard. Central nervous system: Alert and oriented. No focal neurological deficits. Extremities: Symmetric 5 x 5 power. Skin: No rashes, lesions or ulcers.  Psychiatry: Judgement and insight appear normal. Mood & affect appropriate.   Data Reviewed: I have personally reviewed following labs and imaging studies  CBC: Recent Labs  Lab 07/18/23 1245 07/19/23 0635 07/20/23 0504 07/21/23 0533  WBC 17.6* 15.8* 19.1* 12.6*  NEUTROABS 14.6*  --  16.4* 10.3*  HGB 12.0 10.9* 11.2* 11.0*  HCT 39.0 36.4 36.6 35.1*  MCV 92.9 95.8 93.4 91.6  PLT 314 268 278 326   Basic Metabolic Panel: Recent Labs  Lab 07/18/23 1245 07/19/23 0635 07/20/23 0504 07/21/23 0533  NA 134* 135 134* 135   K 4.5 5.4* 5.1 4.8  CL 104 107 104 104  CO2 25 23 22 22   GLUCOSE 211* 232* 160* 244*  BUN 32* 35* 41* 45*  CREATININE 2.07* 1.84* 1.71* 1.58*  CALCIUM 8.7* 7.9* 8.6* 8.9   GFR: Estimated Creatinine Clearance: 46.7 mL/min (A) (by C-G formula based on SCr of 1.58 mg/dL (H)). Liver Function Tests: Recent Labs  Lab 07/18/23 1245 07/19/23 0635  AST 20 12*  ALT 9 8  ALKPHOS 78 74  BILITOT 0.6 0.5  PROT 6.9 5.9*  ALBUMIN 2.6* 2.1*   No results for input(s): "LIPASE", "AMYLASE" in the last 168 hours. No results for input(s): "AMMONIA" in the last 168 hours. Coagulation Profile: Recent Labs  Lab 07/18/23 1245 07/18/23 2050  INR 3.1* 2.9*   Cardiac Enzymes: No results for input(s): "CKTOTAL", "CKMB", "CKMBINDEX", "TROPONINI" in the last 168 hours. BNP (last 3 results) No results for input(s): "PROBNP" in the last 8760 hours. HbA1C: Recent Labs    07/19/23 0635  HGBA1C 7.5*   CBG: Recent Labs  Lab 07/20/23 0728 07/20/23 1143 07/20/23 1638 07/20/23 2118 07/21/23 0732  GLUCAP 135* 230* 282* 225* 199*   Lipid Profile: No results for input(s): "CHOL", "HDL", "LDLCALC", "TRIG", "CHOLHDL", "LDLDIRECT" in the last 72 hours. Thyroid Function Tests: No results for input(s): "TSH", "T4TOTAL", "FREET4", "T3FREE", "THYROIDAB" in the last 72 hours. Anemia Panel: No results for input(s): "VITAMINB12", "FOLATE", "FERRITIN", "TIBC", "IRON", "RETICCTPCT" in the last 72 hours. Sepsis Labs: Recent Labs  Lab 07/18/23 1312 07/18/23 2050 07/18/23 2206 07/19/23 0635  PROCALCITON  --  0.14  --  0.19  LATICACIDVEN 1.5  --  1.1  --     Recent Results (from the past 240 hours)  Culture, blood (Routine x 2)     Status: None (Preliminary result)   Collection Time: 07/18/23  1:05 PM   Specimen: BLOOD  Result Value Ref Range Status   Specimen Description BLOOD LEFT ANTECUBITAL  Final   Special Requests   Final    BOTTLES DRAWN AEROBIC AND ANAEROBIC Blood Culture adequate volume    Culture   Final    NO GROWTH 3 DAYS Performed at [facility], [address] [place], [place] [address]    Report Status PENDING  Incomplete  Resp panel by RT-PCR (RSV, Flu A&B, Covid) Anterior Nasal Swab     Status: None   Collection Time: 07/18/23  3:00 PM   Specimen: Anterior Nasal Swab  Result Value Ref Range Status   SARS Coronavirus 2 by RT PCR NEGATIVE NEGATIVE Final  Influenza A by PCR NEGATIVE NEGATIVE Final   Influenza B by PCR NEGATIVE NEGATIVE Final    Comment: (NOTE) The Xpert Xpress SARS-CoV-2/FLU/RSV plus assay is intended as an aid in the diagnosis of influenza from Nasopharyngeal swab specimens and should not be used as a sole basis for treatment. Nasal washings and aspirates are unacceptable for Xpert Xpress SARS-CoV-2/FLU/RSV testing.  Fact Sheet for Patients: BloggerCourse.com  Fact Sheet for Healthcare Providers: SeriousBroker.it  This test is not yet approved or cleared by the [place] FDA and has been authorized for detection and/or diagnosis of SARS-CoV-2 by FDA under an Emergency Use Authorization (EUA). This EUA will remain in effect (meaning this test can be used) for the duration of the COVID-19 declaration under Section 564(b)(1) of the Act, 21 U.S.C. section 360bbb-3(b)(1), unless the authorization is terminated or revoked.     Resp Syncytial Virus by PCR NEGATIVE NEGATIVE Final    Comment: (NOTE) Fact Sheet for Patients: BloggerCourse.com  Fact Sheet for Healthcare Providers: SeriousBroker.it  This test is not yet approved or cleared by the [place] FDA and has been authorized for detection and/or diagnosis of SARS-CoV-2 by FDA under an Emergency Use Authorization (EUA). This EUA will remain in effect (meaning this test can be used) for the duration of the COVID-19 declaration under Section 564(b)(1) of the Act, 21  U.S.C. section 360bbb-3(b)(1), unless the authorization is terminated or revoked.  Performed at [facility], [address], [place], [place] [address]   Culture, blood (Routine x 2)     Status: None (Preliminary result)   Collection Time: 07/18/23  8:50 PM   Specimen: BLOOD  Result Value Ref Range Status   Specimen Description BLOOD SITE NOT SPECIFIED  Final   Special Requests   Final    BOTTLES DRAWN AEROBIC AND ANAEROBIC Blood Culture results may not be optimal due to an inadequate volume of blood received in culture bottles   Culture   Final    NO GROWTH 3 DAYS Performed at [facility], [address], [place], [place] [address]    Report Status PENDING  Incomplete  Respiratory (~20 pathogens) panel by PCR     Status: None   Collection Time: 07/19/23  9:18 AM   Specimen: Nasopharyngeal Swab; Respiratory  Result Value Ref Range Status   Adenovirus NOT DETECTED NOT DETECTED Final   Coronavirus 229E NOT DETECTED NOT DETECTED Final    Comment: (NOTE) The Coronavirus on the Respiratory Panel, DOES NOT test for the novel  Coronavirus (2019 nCoV)    Coronavirus HKU1 NOT DETECTED NOT DETECTED Final   Coronavirus NL63 NOT DETECTED NOT DETECTED Final   Coronavirus OC43 NOT DETECTED NOT DETECTED Final   Metapneumovirus NOT DETECTED NOT DETECTED Final   Rhinovirus / Enterovirus NOT DETECTED NOT DETECTED Final   Influenza A NOT DETECTED NOT DETECTED Final   Influenza B NOT DETECTED NOT DETECTED Final   Parainfluenza Virus 1 NOT DETECTED NOT DETECTED Final   Parainfluenza Virus 2 NOT DETECTED NOT DETECTED Final   Parainfluenza Virus 3 NOT DETECTED NOT DETECTED Final   Parainfluenza Virus 4 NOT DETECTED NOT DETECTED Final   Respiratory Syncytial Virus NOT DETECTED NOT DETECTED Final   Bordetella pertussis NOT DETECTED NOT DETECTED Final   Bordetella Parapertussis NOT DETECTED NOT DETECTED Final   Chlamydophila pneumoniae NOT DETECTED NOT DETECTED Final   Mycoplasma  pneumoniae NOT DETECTED NOT DETECTED Final    Comment: Performed at [facility], [address], [place], [place] [address]     Radiology Studies: No results found.   Scheduled Meds:  atorvastatin  80 mg Oral q1800   insulin aspart  0-15 Units Subcutaneous TID WC   insulin aspart  0-5 Units Subcutaneous QHS   insulin glargine  30 Units Subcutaneous Daily   ipratropium  0.5 mg Nebulization Q6H   metoprolol  100 mg Oral Daily   mometasone-formoterol  2 puff Inhalation BID   nicotine  21 mg Transdermal Daily   pantoprazole  40 mg Oral Daily   predniSONE  40 mg Oral Q breakfast   pregabalin  75 mg Oral TID   rivaroxaban  20 mg Oral Daily   sodium chloride flush  3 mL Intravenous Q12H   Continuous Infusions:  azithromycin 500 mg (07/20/23 1535)   cefTRIAXone (ROCEPHIN)  IV 1 g (07/20/23 1458)     LOS: 3 days   Hughie Closs, MD [facility]  07/21/2023, 7:55 AM   *Please note that this is a verbal dictation therefore any spelling or grammatical errors are due to the "Dragon Medical One" system interpretation.  Please page via Amion and do not message via secure chat for urgent patient care matters. Secure chat can be used for non urgent patient care matters.  How to contact the [facility] Attending or Consulting provider 7A - 7P or covering provider during after hours 7P -7A, for this patient?  Check the care team in [facility] and look for a) attending/consulting [facility] provider listed and b) the [facility] team listed. Page or secure chat 7A-7P. Log into [email or web address] and use Ruth's universal password to access. If you do not have the password, please contact the hospital operator. Locate the [facility] provider you are looking for under [facility] and page to a number that you can be directly reached. If you still have difficulty reaching the provider, please page the [facility] (Director on Call) for the Hospitalists listed on amion for assistance.

## 2023-07-22 DIAGNOSIS — J9601 Acute respiratory failure with hypoxia: Secondary | ICD-10-CM | POA: Diagnosis not present

## 2023-07-22 LAB — GLUCOSE, CAPILLARY
Glucose-Capillary: 211 mg/dL — ABNORMAL HIGH (ref 70–99)
Glucose-Capillary: 285 mg/dL — ABNORMAL HIGH (ref 70–99)
Glucose-Capillary: 318 mg/dL — ABNORMAL HIGH (ref 70–99)
Glucose-Capillary: 238 mg/dL — ABNORMAL HIGH (ref 70–99)

## 2023-07-22 MED ORDER — INSULIN ASPART 100 UNIT/ML IJ SOLN
0.0000 [IU] | Freq: Three times a day (TID) | INTRAMUSCULAR | Status: DC
  Administered 2023-07-22 – 2023-07-23 (×2): 15 [IU] via SUBCUTANEOUS
  Administered 2023-07-23: 4 [IU] via SUBCUTANEOUS

## 2023-07-22 MED ORDER — INSULIN GLARGINE 100 UNIT/ML ~~LOC~~ SOLN
10.0000 [IU] | Freq: Once | SUBCUTANEOUS | Status: AC
  Administered 2023-07-22: 10 [IU] via SUBCUTANEOUS
  Filled 2023-07-22: qty 0.1

## 2023-07-22 MED ORDER — VITAMIN D (ERGOCALCIFEROL) 1.25 MG (50000 UNIT) PO CAPS
50000.0000 [IU] | ORAL_CAPSULE | ORAL | Status: DC
  Administered 2023-07-22: 50000 [IU] via ORAL
  Filled 2023-07-22: qty 1

## 2023-07-22 MED ORDER — BUDESONIDE 0.25 MG/2ML IN SUSP: 0.5000 mg | Freq: Every day | RESPIRATORY_TRACT | Status: DC

## 2023-07-22 MED ORDER — INSULIN ASPART 100 UNIT/ML IJ SOLN
0.0000 [IU] | Freq: Every day | INTRAMUSCULAR | Status: DC
  Administered 2023-07-22: 2 [IU] via SUBCUTANEOUS

## 2023-07-22 MED ORDER — FERROUS SULFATE 325 (65 FE) MG PO TABS
325.0000 mg | ORAL_TABLET | Freq: Every day | ORAL | Status: DC
  Administered 2023-07-22 – 2023-07-23 (×2): 325 mg via ORAL
  Filled 2023-07-22 (×2): qty 1

## 2023-07-22 MED ORDER — INSULIN GLARGINE 100 UNIT/ML ~~LOC~~ SOLN
40.0000 [IU] | Freq: Every day | SUBCUTANEOUS | Status: DC
  Administered 2023-07-23: 40 [IU] via SUBCUTANEOUS
  Filled 2023-07-22: qty 0.4

## 2023-07-22 MED ORDER — FUROSEMIDE 40 MG PO TABS
40.0000 mg | ORAL_TABLET | Freq: Every day | ORAL | Status: DC
  Administered 2023-07-22 – 2023-07-23 (×2): 40 mg via ORAL
  Filled 2023-07-22 (×2): qty 1

## 2023-07-22 MED ORDER — TIZANIDINE HCL 4 MG PO TABS
4.0000 mg | ORAL_TABLET | Freq: Three times a day (TID) | ORAL | Status: DC | PRN
  Administered 2023-07-22: 4 mg via ORAL
  Filled 2023-07-22: qty 1

## 2023-07-22 NOTE — Progress Notes (Signed)
 Patient O2 levels decreasing into low 80s and sustaining while sleep. 2L of O2 placed on patient.

## 2023-07-22 NOTE — Progress Notes (Signed)
 PROGRESS NOTE    Kaitlin Cantrell  WUJ:[id number] DOB: 1955/12/08 DOA: 07/18/2023 PCP: [name], MD   Brief Narrative:  Kaitlin Cantrell is a 68 y.o. female with medical history significant of hypertension, hyperlipidemia, diabetes, CKD 3b, paroxysmal atrial fibrillation, chronic combined systolic and diastolic CHF, COPD, pulmonary embolism, obesity, chronic pain, anemia, current smoker, RLS presenting with worsening shortness of breath for 2 to 3 days.  Upon arrival to ED, she was hemodynamically stable, was diagnosed with severe sepsis and acute hypoxic respiratory failure secondary to community-acquired pneumonia and acute COPD exacerbation as well as AKI on CKD stage IIIb.  Admitted under hospital service.  Details below.  Assessment & Plan:   Principal Problem:   Acute respiratory failure with hypoxia (HCC) Active Problems:   Hypertension associated with type 2 diabetes mellitus (HCC)   Type 2 diabetes mellitus without complication, without long-term current use of insulin (HCC)   Hyperlipidemia associated with type 2 diabetes mellitus (HCC)   Chronic pain syndrome   Chronic obstructive pulmonary disease (HCC)   Pulmonary thromboembolism (HCC)   Combined systolic and diastolic congestive heart failure (HCC)   Paroxysmal atrial fibrillation (HCC)   BMI 40.0-44.9, adult (HCC)   Acute renal failure superimposed on stage 3b chronic kidney disease (HCC)   Normocytic anemia   Restless leg  Severe sepsis and acute respiratory failure with hypoxia secondary to acute COPD exacerbation and presumed community-acquired pneumonia, POA: Patient meets sepsis criteria with leukocytosis to 17.6, tachypnea, hypoxia, hypotension with presumed source as pneumonia.Initial x-ray read shows chronic changes, could be difficult to tell if any acute on chronic change.  Procalcitonin unremarkable argues against pneumonia and she has chronic leukocytosis as well. UA unremarkable. Has been ruled out  of RSV, flu and COVID already and respiratory viral panel negative as well.  Lactic acid normal.  Symptomatically improving.  However clinically she does appear to have pneumonia.  She was started on Rocephin and Zithromax which I am going to continue for now.  Will also continue prednisone, bronchodilators for COPD exacerbation treatment.  Try to wean as able to.  Encouraged incentive hysterometry.  Failed ambulatory oximetry, dropped to 81% oxygen.  Did not have wheezing yesterday but she has wheezes today and does not feel any better than yesterday.  She does not want to go home with oxygen.   AKI on CKD 3B Baseline creatinine appears to be between 1.4-1.7, presented with 2.07, improved to 1.6 today, close to baseline.  This was likely due to ATN/hypotension which has now improved.  Avoid nephrotoxic agents.  Resumed Entresto yesterday.  Holding [place].  Repeat BMP in the morning and decide.  Will resume Lasix today though.   History of hypertension but had hypotension in the ED: Received IV fluids, antihypertensives held, blood pressure improving but is still labile.  Monitor off of antihypertensives for now.   Hyperlipidemia - Continue home atorvastatin   Diabetes melitis type II: Last hemoglobin A1c in chart from 4 years ago.  Repeat hemoglobin A1c.  Appears to be taking 20 units of Lantus in the morning and 15 at night.  Currently on 25 units of Lantus in the morning, still hyperglycemic, will increase to 30 units today.  Continue SSI.   Paroxysmal atrial fibrillation/history of PE - Continue home Xarelto.  Rates controlled.  Toprol on hold due to low blood pressure but blood pressure is improving so we resumed Toprol-XL back in order to avoid RVR.   Chronic combined systolic and diastolic CHF >  Last echo in 2024 showed EF 60-65%, normal diastolic function, normal RV function. -BNP better than baseline this time.  Started on Entresto yesterday, resuming Lasix today.  Holding  [place]. - I's and O's, daily weights   Hyperkalemia: Resolved.  Current smoker/tobacco dependence: I have discussed tobacco cessation with the patient.  I have counseled the patient regarding the negative impacts of continued tobacco use including but not limited to lung cancer, COPD, and cardiovascular disease.  I have discussed alternatives to tobacco and modalities that may help facilitate tobacco cessation including but not limited to biofeedback, hypnosis, and medications.  Total time spent with tobacco counseling was 5 minutes.  Continue nicotine patch.  DVT prophylaxis: Xarelto   Code Status: Full Code  Family Communication: None present at bedside.  Plan of care discussed with patient in length and he/she verbalized understanding and agreed with it.  Status is: Inpatient Remains inpatient appropriate because: Still hypoxic and symptomatic.   Estimated body mass index is 42.56 kg/m as calculated from the following:   Height as of this encounter: 5\' 6"  (1.676 m).   Weight as of this encounter: 119.6 kg.    Nutritional Assessment: Body mass index is 42.56 kg/m.Marland Kitchen Seen by dietician.  I agree with the assessment and plan as outlined below: Nutrition Status:        . Skin Assessment: I have examined the patient's skin and I agree with the wound assessment as performed by the wound care RN as outlined below:    Consultants:  None  Procedures:  None  Antimicrobials:  Anti-infectives (From admission, onward)    Start     Dose/Rate Route Frequency Ordered Stop   07/19/23 1500  azithromycin (ZITHROMAX) 500 mg in sodium chloride 0.9 % 250 mL IVPB        500 mg 250 mL/hr over 60 Minutes Intravenous Every 24 hours 07/18/23 1921     07/19/23 1500  cefTRIAXone (ROCEPHIN) 1 g in sodium chloride 0.9 % 100 mL IVPB        1 g 200 mL/hr over 30 Minutes Intravenous Every 24 hours 07/18/23 1921     07/18/23 1430  cefTRIAXone (ROCEPHIN) 1 g in sodium chloride 0.9 % 100 mL IVPB         1 g 200 mL/hr over 30 Minutes Intravenous  Once 07/18/23 1428 07/18/23 1515   07/18/23 1430  azithromycin (ZITHROMAX) 500 mg in sodium chloride 0.9 % 250 mL IVPB        500 mg 250 mL/hr over 60 Minutes Intravenous  Once 07/18/23 1428 07/18/23 1559         Subjective: Patient seen and examined.  She says that she is not any better than yesterday.  Still with shortness of breath.  Hypoxic.  Prefers to go home without oxygen.  She would rather stay here until she is off of oxygen.  Objective: Vitals:   07/21/23 1957 07/21/23 2300 07/22/23 0405 07/22/23 0757  BP: 127/79 (!) 147/75 134/61 (!) 151/87  Pulse: 63 72 80 77  Resp:    18  Temp: 98 F (36.7 C) 98.6 F (37 C) 98.4 F (36.9 C) 97.9 F (36.6 C)  TempSrc: Oral Axillary Oral Oral  SpO2: 93% 93% 92% 92%  Weight:      Height:       No intake or output data in the 24 hours ending 07/22/23 0930  Filed Weights   07/18/23 1242 07/20/23 0500  Weight: 115.2 kg 119.6 kg    Examination:  General exam: Appears calm and comfortable  Respiratory system: Diffuse expiratory wheezes. Respiratory effort normal. Cardiovascular system: S1 & S2 heard, RRR. No JVD, murmurs, rubs, gallops or clicks. No pedal edema. Gastrointestinal system: Abdomen is nondistended, soft and nontender. No organomegaly or masses felt. Normal bowel sounds heard. Central nervous system: Alert and oriented. No focal neurological deficits. Extremities: Symmetric 5 x 5 power. Skin: No rashes, lesions or ulcers.  Psychiatry: Judgement and insight appear normal. Mood & affect appropriate.     Data Reviewed: I have personally reviewed following labs and imaging studies  CBC: Recent Labs  Lab 07/18/23 1245 07/19/23 0635 07/20/23 0504 07/21/23 0533  WBC 17.6* 15.8* 19.1* 12.6*  NEUTROABS 14.6*  --  16.4* 10.3*  HGB 12.0 10.9* 11.2* 11.0*  HCT 39.0 36.4 36.6 35.1*  MCV 92.9 95.8 93.4 91.6  PLT 314 268 278 326   Basic Metabolic Panel: Recent Labs   Lab 07/18/23 1245 07/19/23 0635 07/20/23 0504 07/21/23 0533  NA 134* 135 134* 135  K 4.5 5.4* 5.1 4.8  CL 104 107 104 104  CO2 25 23 22 22   GLUCOSE 211* 232* 160* 244*  BUN 32* 35* 41* 45*  CREATININE 2.07* 1.84* 1.71* 1.58*  CALCIUM 8.7* 7.9* 8.6* 8.9   GFR: Estimated Creatinine Clearance: 46.7 mL/min (A) (by C-G formula based on SCr of 1.58 mg/dL (H)). Liver Function Tests: Recent Labs  Lab 07/18/23 1245 07/19/23 0635  AST 20 12*  ALT 9 8  ALKPHOS 78 74  BILITOT 0.6 0.5  PROT 6.9 5.9*  ALBUMIN 2.6* 2.1*   No results for input(s): "LIPASE", "AMYLASE" in the last 168 hours. No results for input(s): "AMMONIA" in the last 168 hours. Coagulation Profile: Recent Labs  Lab 07/18/23 1245 07/18/23 2050  INR 3.1* 2.9*   Cardiac Enzymes: No results for input(s): "CKTOTAL", "CKMB", "CKMBINDEX", "TROPONINI" in the last 168 hours. BNP (last 3 results) No results for input(s): "PROBNP" in the last 8760 hours. HbA1C: No results for input(s): "HGBA1C" in the last 72 hours.  CBG: Recent Labs  Lab 07/21/23 0732 07/21/23 1124 07/21/23 1736 07/21/23 2110 07/22/23 0756  GLUCAP 199* 229* 279* 273* 211*   Lipid Profile: No results for input(s): "CHOL", "HDL", "LDLCALC", "TRIG", "CHOLHDL", "LDLDIRECT" in the last 72 hours. Thyroid Function Tests: No results for input(s): "TSH", "T4TOTAL", "FREET4", "T3FREE", "THYROIDAB" in the last 72 hours. Anemia Panel: No results for input(s): "VITAMINB12", "FOLATE", "FERRITIN", "TIBC", "IRON", "RETICCTPCT" in the last 72 hours. Sepsis Labs: Recent Labs  Lab 07/18/23 1312 07/18/23 2050 07/18/23 2206 07/19/23 0635  PROCALCITON  --  0.14  --  0.19  LATICACIDVEN 1.5  --  1.1  --     Recent Results (from the past 240 hours)  Culture, blood (Routine x 2)     Status: None (Preliminary result)   Collection Time: 07/18/23  1:05 PM   Specimen: BLOOD  Result Value Ref Range Status   Specimen Description BLOOD LEFT ANTECUBITAL  Final    Special Requests   Final    BOTTLES DRAWN AEROBIC AND ANAEROBIC Blood Culture adequate volume   Culture   Final    NO GROWTH 4 DAYS Performed at [facility], [address] [place], [place] [address]    Report Status PENDING  Incomplete  Resp panel by RT-PCR (RSV, Flu A&B, Covid) Anterior Nasal Swab     Status: None   Collection Time: 07/18/23  3:00 PM   Specimen: Anterior Nasal Swab  Result Value Ref Range  Status   SARS Coronavirus 2 by RT PCR NEGATIVE NEGATIVE Final   Influenza A by PCR NEGATIVE NEGATIVE Final   Influenza B by PCR NEGATIVE NEGATIVE Final    Comment: (NOTE) The Xpert Xpress SARS-CoV-2/FLU/RSV plus assay is intended as an aid in the diagnosis of influenza from Nasopharyngeal swab specimens and should not be used as a sole basis for treatment. Nasal washings and aspirates are unacceptable for Xpert Xpress SARS-CoV-2/FLU/RSV testing.  Fact Sheet for Patients: BloggerCourse.com  Fact Sheet for Healthcare Providers: SeriousBroker.it  This test is not yet approved or cleared by the [place] FDA and has been authorized for detection and/or diagnosis of SARS-CoV-2 by FDA under an Emergency Use Authorization (EUA). This EUA will remain in effect (meaning this test can be used) for the duration of the COVID-19 declaration under Section 564(b)(1) of the Act, 21 U.S.C. section 360bbb-3(b)(1), unless the authorization is terminated or revoked.     Resp Syncytial Virus by PCR NEGATIVE NEGATIVE Final    Comment: (NOTE) Fact Sheet for Patients: BloggerCourse.com  Fact Sheet for Healthcare Providers: SeriousBroker.it  This test is not yet approved or cleared by the [place] FDA and has been authorized for detection and/or diagnosis of SARS-CoV-2 by FDA under an Emergency Use Authorization (EUA). This EUA will remain in effect (meaning this test can be  used) for the duration of the COVID-19 declaration under Section 564(b)(1) of the Act, 21 U.S.C. section 360bbb-3(b)(1), unless the authorization is terminated or revoked.  Performed at [facility], [address], [place], [place] [address]   Culture, blood (Routine x 2)     Status: None (Preliminary result)   Collection Time: 07/18/23  8:50 PM   Specimen: BLOOD  Result Value Ref Range Status   Specimen Description BLOOD SITE NOT SPECIFIED  Final   Special Requests   Final    BOTTLES DRAWN AEROBIC AND ANAEROBIC Blood Culture results may not be optimal due to an inadequate volume of blood received in culture bottles   Culture   Final    NO GROWTH 4 DAYS Performed at [facility], [address] [place], [place] [address]    Report Status PENDING  Incomplete  Respiratory (~20 pathogens) panel by PCR     Status: None   Collection Time: 07/19/23  9:18 AM   Specimen: Nasopharyngeal Swab; Respiratory  Result Value Ref Range Status   Adenovirus NOT DETECTED NOT DETECTED Final   Coronavirus 229E NOT DETECTED NOT DETECTED Final    Comment: (NOTE) The Coronavirus on the Respiratory Panel, DOES NOT test for the novel  Coronavirus (2019 nCoV)    Coronavirus HKU1 NOT DETECTED NOT DETECTED Final   Coronavirus NL63 NOT DETECTED NOT DETECTED Final   Coronavirus OC43 NOT DETECTED NOT DETECTED Final   Metapneumovirus NOT DETECTED NOT DETECTED Final   Rhinovirus / Enterovirus NOT DETECTED NOT DETECTED Final   Influenza A NOT DETECTED NOT DETECTED Final   Influenza B NOT DETECTED NOT DETECTED Final   Parainfluenza Virus 1 NOT DETECTED NOT DETECTED Final   Parainfluenza Virus 2 NOT DETECTED NOT DETECTED Final   Parainfluenza Virus 3 NOT DETECTED NOT DETECTED Final   Parainfluenza Virus 4 NOT DETECTED NOT DETECTED Final   Respiratory Syncytial Virus NOT DETECTED NOT DETECTED Final   Bordetella pertussis NOT DETECTED NOT DETECTED Final   Bordetella Parapertussis NOT DETECTED NOT  DETECTED Final   Chlamydophila pneumoniae NOT DETECTED NOT DETECTED Final   Mycoplasma pneumoniae NOT DETECTED NOT  DETECTED Final    Comment: Performed at [facility], [address] [place], [place] [address]     Radiology Studies: No results found.   Scheduled Meds:  atorvastatin  80 mg Oral q1800   insulin aspart  0-15 Units Subcutaneous TID WC   insulin aspart  0-5 Units Subcutaneous QHS   insulin glargine  30 Units Subcutaneous Daily   metoprolol  100 mg Oral Daily   mometasone-formoterol  2 puff Inhalation BID   nicotine  21 mg Transdermal Daily   pantoprazole  40 mg Oral Daily   predniSONE  40 mg Oral Q breakfast   pregabalin  75 mg Oral TID   rivaroxaban  20 mg Oral Daily   sacubitril-valsartan  1 tablet Oral BID   sodium chloride flush  3 mL Intravenous Q12H   Continuous Infusions:  azithromycin 500 mg (07/21/23 1105)   cefTRIAXone (ROCEPHIN)  IV 1 g (07/21/23 1009)     LOS: 4 days   Hughie Closs, MD [facility]  07/22/2023, 9:30 AM   *Please note that this is a verbal dictation therefore any spelling or grammatical errors are due to the "Dragon Medical One" system interpretation.  Please page via Amion and do not message via secure chat for urgent patient care matters. Secure chat can be used for non urgent patient care matters.  How to contact the [facility] Attending or Consulting provider 7A - 7P or covering provider during after hours 7P -7A, for this patient?  Check the care team in [facility] and look for a) attending/consulting [facility] provider listed and b) the [facility] team listed. Page or secure chat 7A-7P. Log into [email or web address] and use [place]'s universal password to access. If you do not have the password, please contact the hospital operator. Locate the [facility] provider you are looking for under [facility] and page to a number that you can be directly reached. If you still have difficulty reaching the provider, please page the [facility] (Director on Call)  for the Hospitalists listed on amion for assistance.

## 2023-07-22 NOTE — Progress Notes (Signed)
 Patient ambulated in hallway this morning without oxygen on. O2 dropped to 81 and sustained.

## 2023-07-23 DIAGNOSIS — J9601 Acute respiratory failure with hypoxia: Secondary | ICD-10-CM | POA: Diagnosis not present

## 2023-07-23 LAB — CULTURE, BLOOD (ROUTINE X 2)
Culture: NO GROWTH
Culture: NO GROWTH
Special Requests: ADEQUATE

## 2023-07-23 LAB — GLUCOSE, CAPILLARY
Glucose-Capillary: 188 mg/dL — ABNORMAL HIGH (ref 70–99)
Glucose-Capillary: 331 mg/dL — ABNORMAL HIGH (ref 70–99)

## 2023-07-23 MED ORDER — NICOTINE 21 MG/24HR TD PT24: 21.0000 mg | MEDICATED_PATCH | Freq: Every day | TRANSDERMAL | 0 refills | Status: DC

## 2023-07-23 NOTE — Progress Notes (Signed)
 Received referral to assist with home O2. Met with pt to discuss DME referral. Pt plans to return home with the support of her husband. Pt's husband will provide transportation at time of DC. She reports she is able to drive but her husband takes her to her appointments. PCP is Dr. Otho Darner. She denies any issues filling her prescriptions. She reports she doesn't have a preference for a DME agency. Discussed [employer] for the home O2 and pt agreed with agency. Contacted Jermaine at [employer] and he accepted the referral.

## 2023-07-23 NOTE — Discharge Summary (Signed)
 Physician Discharge Summary  [name]:[id number] DOB: 1955-08-29 DOA: 07/18/2023  PCP: [name], MD  Admit date: 07/18/2023 Discharge date: 07/23/2023  Admitted From: Home  Discharge disposition: Home with home oxygen  Recommendations for Outpatient Follow-Up:   Follow up with your primary care provider in one week.  Check CBC, BMP, magnesium in the next visit Patient should be encouraged to quit smoking.  Nicotine patch has been prescribed on discharge.   Discharge Diagnosis:   Principal Problem:   Acute respiratory failure with hypoxia (HCC) Active Problems:   Hypertension associated with type 2 diabetes mellitus (HCC)   Type 2 diabetes mellitus without complication, without long-term current use of insulin (HCC)   Hyperlipidemia associated with type 2 diabetes mellitus (HCC)   Chronic pain syndrome   Chronic obstructive pulmonary disease (HCC)   Pulmonary thromboembolism (HCC)   Combined systolic and diastolic congestive heart failure (HCC)   Paroxysmal atrial fibrillation (HCC)   BMI 40.0-44.9, adult (HCC)   Acute renal failure superimposed on stage 3b chronic kidney disease (HCC)   Normocytic anemia   Restless leg  \ Discharge Condition: Improved.  Diet recommendation:  Carbohydrate-modified.   Wound care: None.  Code status: Full.   History of Present Illness:   Kaitlin Cantrell is a 68 y.o. female with medical history significant of hypertension, hyperlipidemia, diabetes, CKD 3b, paroxysmal atrial fibrillation, chronic combined systolic and diastolic CHF, COPD, pulmonary embolism, obesity, chronic pain, anemia, current smoker, RLS presenting with worsening shortness of breath for 2 to 3 days.  Upon arrival to ED, she was hemodynamically stable, was diagnosed with severe sepsis and acute hypoxic respiratory failure secondary to community-acquired pneumonia and acute COPD exacerbation as well as AKI on CKD stage IIIb.    Hospital Course:    Following conditions were addressed during hospitalization as listed below,  Severe sepsis and acute respiratory failure with hypoxia secondary to acute COPD exacerbation and presumed community-acquired pneumonia  Sepsis physiology has resolved.  Will complete a 5-day course of antibiotic today. RSV, flu, COVID and respiratory viral panel negative as well.  Lactic acid normal.  Received bronchodilators and steroids during hospitalization.  Continue incentive spirometry.  At this time patient has qualified for home oxygen which will be prescribed on discharge.  Spoke extensively regarding quitting smoking.    AKI on CKD 3B Baseline creatinine appears to be between 1.4-1.7, presented with 2.07, improved to 1.5 today.  At baseline.  Will be resumed on home medications on discharge.    Essential hypertension.  Resume metoprolol from home   Hyperlipidemia Continue Lipitor   Diabetes melitis type II: Patient is on Jardiance, glimepiride and Lantus at home which will be resumed on discharge.   Paroxysmal atrial fibrillation/history of PE Continue Toprol and Xarelto from home.   Chronic combined systolic and diastolic CHF Review of last echo in 2024 showed EF 60-65%, normal diastolic function, normal RV function. Will resume Entresto Lasix Jardiance on discharge.   Hyperkalemia: Resolved.  Class III obesity. Body mass index is 43.27 kg/m.  Would benefit from weight loss as outpatient.   Current smoker/tobacco dependence: I discussed the prolonged tobacco cessation with her.  Will prescribe nicotine patch on discharge.    Disposition.  At this time, patient is stable for disposition home with outpatient PCP follow-up.  Medical Consultants:   None.  Procedures:    None Subjective:   Today, patient was seen and examined at bedside.  Complains of mild shortness of breath but  denies any dyspnea chest pain fever chills or rigor.  Has mild cough.  Discharge Exam:   Vitals:    07/23/23 0748 07/23/23 1153  BP: (!) 175/76 (!) 153/77  Pulse: 89 61  Resp:    Temp: 98 F (36.7 C) 98 F (36.7 C)  SpO2: 95% 93%   Vitals:   07/22/23 2309 07/23/23 0310 07/23/23 0748 07/23/23 1153  BP: (!) 119/56 (!) 156/77 (!) 175/76 (!) 153/77  Pulse: (!) 58 70 89 61  Resp:      Temp: 97.7 F (36.5 C) 97.9 F (36.6 C) 98 F (36.7 C) 98 F (36.7 C)  TempSrc: Oral Oral Oral Oral  SpO2: 95% 94% 95% 93%  Weight:      Height:        Body Mass Index: 43.27 kg/m     General: Alert awake, not in obvious distress, on nasal cannula oxygen,  obese HENT: pupils equally reacting to light,  No scleral pallor or icterus noted. Oral mucosa is moist.  Chest:   Diminished breath sounds bilaterally.  No overt wheezing noted CVS: S1 &S2 heard. No murmur.  Regular rate and rhythm. Abdomen: Soft, nontender, nondistended.  Bowel sounds are heard.   Extremities: No cyanosis, clubbing or edema.  Peripheral pulses are palpable. Psych: Alert, awake and oriented, normal mood CNS:  No cranial nerve deficits.  Power equal in all extremities.   Skin: Warm and dry.  No rashes noted.  The results of significant diagnostics from this hospitalization (including imaging, microbiology, ancillary and laboratory) are listed below for reference.     Diagnostic Studies:   DG Chest 2 View Result Date: 07/18/2023 CLINICAL DATA:  Shortness of breath.  Suspected sepsis. EXAM: CHEST - 2 VIEW COMPARISON:  CT 07/10/2023.  Radiographs 02/23/2023 and 02/20/2023. FINDINGS: The heart size and mediastinal contours are stable. Chronic central airway thickening with mild pulmonary hyperinflation. No confluent airspace disease, pleural effusion or pneumothorax. The bones appear unremarkable. Telemetry leads overlie the chest. IMPRESSION: Chronic central airway thickening and mild pulmonary hyperinflation. No evidence of acute cardiopulmonary process. Electronically Signed   By: Carey Bullocks M.D.   On: 07/18/2023 15:31      Labs:   Basic Metabolic Panel: Recent Labs  Lab 07/18/23 1245 07/19/23 0635 07/20/23 0504 07/21/23 0533  NA 134* 135 134* 135  K 4.5 5.4* 5.1 4.8  CL 104 107 104 104  CO2 25 23 22 22   GLUCOSE 211* 232* 160* 244*  BUN 32* 35* 41* 45*  CREATININE 2.07* 1.84* 1.71* 1.58*  CALCIUM 8.7* 7.9* 8.6* 8.9   GFR Estimated Creatinine Clearance: 47.2 mL/min (A) (by C-G formula based on SCr of 1.58 mg/dL (H)). Liver Function Tests: Recent Labs  Lab 07/18/23 1245 07/19/23 0635  AST 20 12*  ALT 9 8  ALKPHOS 78 74  BILITOT 0.6 0.5  PROT 6.9 5.9*  ALBUMIN 2.6* 2.1*   No results for input(s): "LIPASE", "AMYLASE" in the last 168 hours. No results for input(s): "AMMONIA" in the last 168 hours. Coagulation profile Recent Labs  Lab 07/18/23 1245 07/18/23 2050  INR 3.1* 2.9*    CBC: Recent Labs  Lab 07/18/23 1245 07/19/23 0635 07/20/23 0504 07/21/23 0533  WBC 17.6* 15.8* 19.1* 12.6*  NEUTROABS 14.6*  --  16.4* 10.3*  HGB 12.0 10.9* 11.2* 11.0*  HCT 39.0 36.4 36.6 35.1*  MCV 92.9 95.8 93.4 91.6  PLT 314 268 278 326   Cardiac Enzymes: No results for input(s): "CKTOTAL", "CKMB", "CKMBINDEX", "TROPONINI" in the  last 168 hours. BNP: Invalid input(s): "POCBNP" CBG: Recent Labs  Lab 07/22/23 1230 07/22/23 1713 07/22/23 2112 07/23/23 0750 07/23/23 1156  GLUCAP 285* 318* 238* 188* 331*   D-Dimer No results for input(s): "DDIMER" in the last 72 hours. Hgb A1c No results for input(s): "HGBA1C" in the last 72 hours. Lipid Profile No results for input(s): "CHOL", "HDL", "LDLCALC", "TRIG", "CHOLHDL", "LDLDIRECT" in the last 72 hours. Thyroid function studies No results for input(s): "TSH", "T4TOTAL", "T3FREE", "THYROIDAB" in the last 72 hours.  Invalid input(s): "FREET3" Anemia work up No results for input(s): "VITAMINB12", "FOLATE", "FERRITIN", "TIBC", "IRON", "RETICCTPCT" in the last 72 hours. Microbiology Recent Results (from the past 240 hours)  Culture,  blood (Routine x 2)     Status: None   Collection Time: 07/18/23  1:05 PM   Specimen: BLOOD  Result Value Ref Range Status   Specimen Description BLOOD LEFT ANTECUBITAL  Final   Special Requests   Final    BOTTLES DRAWN AEROBIC AND ANAEROBIC Blood Culture adequate volume   Culture   Final    NO GROWTH 5 DAYS Performed at [facility], [address] [place], [place] [address]    Report Status 07/23/2023 FINAL  Final  Resp panel by RT-PCR (RSV, Flu A&B, Covid) Anterior Nasal Swab     Status: None   Collection Time: 07/18/23  3:00 PM   Specimen: Anterior Nasal Swab  Result Value Ref Range Status   SARS Coronavirus 2 by RT PCR NEGATIVE NEGATIVE Final   Influenza A by PCR NEGATIVE NEGATIVE Final   Influenza B by PCR NEGATIVE NEGATIVE Final    Comment: (NOTE) The Xpert Xpress SARS-CoV-2/FLU/RSV plus assay is intended as an aid in the diagnosis of influenza from Nasopharyngeal swab specimens and should not be used as a sole basis for treatment. Nasal washings and aspirates are unacceptable for Xpert Xpress SARS-CoV-2/FLU/RSV testing.  Fact Sheet for Patients: BloggerCourse.com  Fact Sheet for Healthcare Providers: SeriousBroker.it  This test is not yet approved or cleared by the [place] FDA and has been authorized for detection and/or diagnosis of SARS-CoV-2 by FDA under an Emergency Use Authorization (EUA). This EUA will remain in effect (meaning this test can be used) for the duration of the COVID-19 declaration under Section 564(b)(1) of the Act, 21 U.S.C. section 360bbb-3(b)(1), unless the authorization is terminated or revoked.     Resp Syncytial Virus by PCR NEGATIVE NEGATIVE Final    Comment: (NOTE) Fact Sheet for Patients: BloggerCourse.com  Fact Sheet for Healthcare Providers: SeriousBroker.it  This test is not yet approved or cleared by the [place]  FDA and has been authorized for detection and/or diagnosis of SARS-CoV-2 by FDA under an Emergency Use Authorization (EUA). This EUA will remain in effect (meaning this test can be used) for the duration of the COVID-19 declaration under Section 564(b)(1) of the Act, 21 U.S.C. section 360bbb-3(b)(1), unless the authorization is terminated or revoked.  Performed at [facility], [address], [place], [place] [address]   Culture, blood (Routine x 2)     Status: None   Collection Time: 07/18/23  8:50 PM   Specimen: BLOOD  Result Value Ref Range Status   Specimen Description BLOOD SITE NOT SPECIFIED  Final   Special Requests   Final    BOTTLES DRAWN AEROBIC AND ANAEROBIC Blood Culture results may not be optimal due to an inadequate volume of blood received in culture bottles   Culture   Final  NO GROWTH 5 DAYS Performed at [facility], [address] [place], [place] [address]    Report Status 07/23/2023 FINAL  Final  Respiratory (~20 pathogens) panel by PCR     Status: None   Collection Time: 07/19/23  9:18 AM   Specimen: Nasopharyngeal Swab; Respiratory  Result Value Ref Range Status   Adenovirus NOT DETECTED NOT DETECTED Final   Coronavirus 229E NOT DETECTED NOT DETECTED Final    Comment: (NOTE) The Coronavirus on the Respiratory Panel, DOES NOT test for the novel  Coronavirus (2019 nCoV)    Coronavirus HKU1 NOT DETECTED NOT DETECTED Final   Coronavirus NL63 NOT DETECTED NOT DETECTED Final   Coronavirus OC43 NOT DETECTED NOT DETECTED Final   Metapneumovirus NOT DETECTED NOT DETECTED Final   Rhinovirus / Enterovirus NOT DETECTED NOT DETECTED Final   Influenza A NOT DETECTED NOT DETECTED Final   Influenza B NOT DETECTED NOT DETECTED Final   Parainfluenza Virus 1 NOT DETECTED NOT DETECTED Final   Parainfluenza Virus 2 NOT DETECTED NOT DETECTED Final   Parainfluenza Virus 3 NOT DETECTED NOT DETECTED Final   Parainfluenza Virus 4 NOT DETECTED NOT DETECTED Final    Respiratory Syncytial Virus NOT DETECTED NOT DETECTED Final   Bordetella pertussis NOT DETECTED NOT DETECTED Final   Bordetella Parapertussis NOT DETECTED NOT DETECTED Final   Chlamydophila pneumoniae NOT DETECTED NOT DETECTED Final   Mycoplasma pneumoniae NOT DETECTED NOT DETECTED Final    Comment: Performed at [facility], [address], [place], [place] [address]     Discharge Instructions:   Discharge Instructions     Call MD for:  difficulty breathing, headache or visual disturbances   Complete by: As directed    Call MD for:  temperature >100.4   Complete by: As directed    Diet - low sodium heart healthy   Complete by: As directed    Diet Carb Modified   Complete by: As directed    Discharge instructions   Complete by: As directed    Follow-up with your primary care provider in 1 week.  Check blood work at that time.  Seek medical attention for worsening symptoms.  No overexertion.  Please quit smoking.  Use nicotine patch to assist quit smoking.  Continue oxygen at home   Increase activity slowly   Complete by: As directed       Allergies as of 07/23/2023       Reactions   Albuterol Anaphylaxis   Throat/lips swelling   Morphine And Codeine Other (See Comments)   Very aggressive and doesn't help pain        Medication List     TAKE these medications    acetaminophen 500 MG tablet Commonly known as: TYLENOL Take 1,000 mg by mouth every 6 (six) hours as needed for moderate pain (pain score 4-6).   amLODipine 5 MG tablet Commonly known as: NORVASC Take 1 tablet by mouth once daily   atorvastatin 80 MG tablet Commonly known as: LIPITOR TAKE 1 TABLET BY MOUTH ONCE DAILY AT  6PM   Atrovent HFA 17 MCG/ACT inhaler Generic drug: ipratropium Inhale 2 puffs into the lungs every 6 (six) hours as needed for wheezing.   budesonide 0.25 MG/2ML nebulizer solution Commonly known as: Pulmicort Take 4 mLs (0.5 mg total) by nebulization daily.    budesonide-formoterol 160-4.5 MCG/ACT inhaler Commonly known as: Symbicort Inhale 1 puff into the lungs 2 (two) times daily.   empagliflozin 25 MG Tabs tablet Commonly  known as: Jardiance Take 1 tablet (25 mg total) by mouth daily before breakfast.   Entresto 97-103 MG Generic drug: sacubitril-valsartan Take 1 tablet by mouth 2 (two) times daily.   ergocalciferol 1.25 MG (50000 UT) capsule Commonly known as: Drisdol Take 1 capsule (50,000 Units total) by mouth once a week.   FreeStyle Libre 3 Plus Sensor Misc Change sensor every 15 days.   furosemide 40 MG tablet Commonly known as: LASIX Take 1 tablet by mouth once daily   glimepiride 4 MG tablet Commonly known as: AMARYL Take 1 tablet by mouth twice daily   ipratropium 0.02 % nebulizer solution Commonly known as: ATROVENT Take 2.5 mLs (0.5 mg total) by nebulization in the morning and at bedtime.   Krill Oil Caps Take 1 capsule by mouth daily.   Lantus SoloStar 100 UNIT/ML Solostar Pen Generic drug: insulin glargine INJECT 20 Units in the morning and 15 units in evening   Medical Compression Stockings Misc Apply to feet in morning.  Remove at night before sleeping.   metoprolol 200 MG 24 hr tablet Commonly known as: TOPROL-XL Take 0.5 tablets (100 mg total) by mouth daily.   nicotine 21 mg/24hr patch Commonly known as: NICODERM CQ - dosed in mg/24 hours Place 1 patch (21 mg total) onto the skin daily. Start taking on: July 24, 2023   omeprazole 20 MG capsule Commonly known as: PRILOSEC Take 20 mg by mouth daily.   pregabalin 75 MG capsule Commonly known as: Lyrica Take 1 capsule (75 mg total) by mouth 3 (three) times daily.   rivaroxaban 20 MG Tabs tablet Commonly known as: Xarelto Take 1 tablet (20 mg total) by mouth daily.   SV Iron 325 (65 Fe) MG tablet Generic drug: ferrous sulfate Take 1 tablet by mouth once daily   tiZANidine 4 MG tablet Commonly known as: Zanaflex Take 1 tablet (4 mg  total) by mouth every 8 (eight) hours as needed for muscle spasms.               Durable Medical Equipment  (From admission, onward)           Start     Ordered   07/22/23 0805  For home use only DME oxygen  Once       Question Answer Comment  Length of Need Lifetime   Mode or (Route) Nasal cannula   Liters per Minute 2   Frequency Continuous (stationary and portable oxygen unit needed)   Oxygen delivery system Gas      07/22/23 0804            Follow-up Information     [facility] Follow up.   Why: [employer] will provide your home oxygen. Contact information: [address] [place] [address] [phone]         [name], MD Follow up in 1 week(s).   Specialty: Family Medicine Contact information: [address] [place] [address] [phone]                  Time coordinating discharge: 39 minutes  Signed:  Kable Haywood  [facility] 07/23/2023, 2:18 PM

## 2023-07-24 ENCOUNTER — Telehealth: Payer: Self-pay | Admitting: *Deleted

## 2023-07-24 ENCOUNTER — Telehealth: Payer: Self-pay

## 2023-07-24 NOTE — Transitions of Care (Post Inpatient/ED Visit) (Signed)
   07/24/2023  Name: Kaitlin Cantrell MRN: [id number] DOB: January 23, 1956  Today's TOC FU Call Status: Today's TOC FU Call Status:: Unsuccessful Call (1st Attempt) Unsuccessful Call (1st Attempt) Date: 07/24/23  Attempted to reach the patient regarding the most recent Inpatient/ED visit.  Follow Up Plan: Additional outreach attempts will be made to reach the patient to complete the Transitions of Care (Post Inpatient/ED visit) call.   Gean Maidens BSN RN [facility] Care Management Coordinator [email or web address] Direct Dial: [phone]  Fax: [phone] Website: [email or web address]

## 2023-07-24 NOTE — Telephone Encounter (Signed)
 Copied from CRM [id number]. Topic: Appointments - Appointment Scheduling >> Jul 24, 2023  9:56 AM Sherron Monday B wrote: Patient/patient representative is calling to schedule an appointment. Unable to schedule a hospital follow up due to no availability within the two weeks of the patients discharge. Patient was seen in the hospital for pneumonia and is requesting to be seen within one week of her discharge, 07/23/2023. Please review and contact the patient for scheduling.

## 2023-07-24 NOTE — Telephone Encounter (Signed)
 Pt is scheduled

## 2023-07-25 ENCOUNTER — Telehealth: Payer: Self-pay | Admitting: *Deleted

## 2023-07-25 NOTE — Transitions of Care (Post Inpatient/ED Visit) (Signed)
   07/25/2023  Name: CARLE DARGAN MRN: [id number] DOB: 1955-06-29  Today's TOC FU Call Status: Today's TOC FU Call Status:: Unsuccessful Call (2nd Attempt) Unsuccessful Call (2nd Attempt) Date: 07/25/23  Attempted to reach the patient regarding the most recent Inpatient/ED visit.  Follow Up Plan: Additional outreach attempts will be made to reach the patient to complete the Transitions of Care (Post Inpatient/ED visit) call.   Gean Maidens BSN RN [facility] Health Care Management Coordinator [email or web address] Direct Dial: [phone]  Fax: [phone] Website: [email or web address]

## 2023-07-25 NOTE — Transitions of Care (Post Inpatient/ED Visit) (Signed)
 07/25/2023  Name: Kaitlin Cantrell MRN: [id number] DOB: 08-Nov-1955  Today's TOC FU Call Status: Today's TOC FU Call Status:: Successful TOC FU Call Completed TOC FU Call Complete Date: 07/25/23 Patient's Name and Date of Birth confirmed.  Transition Care Management Follow-up Telephone Call Date of Discharge: 07/24/23 Discharge Facility: [facility]) Type of Discharge: Inpatient Admission Primary Inpatient Discharge Diagnosis:: Acute Respiratory Failure With Hypoxia How have you been since you were released from the hospital?: Better Any questions or concerns?: No  Items Reviewed: Did you receive and understand the discharge instructions provided?: Yes Medications obtained,verified, and reconciled?: Yes (Medications Reviewed) Any new allergies since your discharge?: No Dietary orders reviewed?: No Do you have support at home?: Yes People in Home: spouse Name of Support/Comfort Primary Source: Molly Maduro  Medications Reviewed Today: Medications Reviewed Today     Reviewed by Luella Cook, RN (Case Manager) on 07/25/23 at 1713  Med List Status: <None>   Medication Order Taking? Sig Documenting Provider Last Dose Status Informant  acetaminophen (TYLENOL) 500 MG tablet [id number] Yes Take 1,000 mg by mouth every 6 (six) hours as needed for moderate pain (pain score 4-6). [provider] Taking Active Self, Pharmacy Records  amLODipine (NORVASC) 5 MG tablet [id number] Yes Take 1 tablet by mouth once daily Melida Quitter, PA Taking Active Self, Pharmacy Records  atorvastatin (LIPITOR) 80 MG tablet [id number] Yes TAKE 1 TABLET BY MOUTH ONCE DAILY AT  Madelin Rear, MD Taking Active Self, Pharmacy Records  budesonide (PULMICORT) 0.25 MG/2ML nebulizer solution [id number] Yes Take 4 mLs (0.5 mg total) by nebulization daily. Carlean Jews, NP Taking Active Self, Pharmacy Records           Med Note (CRUTHIS, Marcy Siren   Tue Feb 21, 2023  7:14 AM)     budesonide-formoterol [facility]) 160-4.5 MCG/ACT inhaler [id number]  Inhale 1 puff into the lungs 2 (two) times daily.  Patient not taking: Reported on 07/18/2023   Carlean Jews, NP  Active Self, Pharmacy Records           Med Note [facility] [place], [place] A   Tue Jul 18, 2023  4:37 PM) Pt hasn't picked up refill from pharmacy  Continuous Glucose Sensor (FREESTYLE LIBRE 3 PLUS SENSOR) MISC [id number]  Change sensor every 15 days.  Patient not taking: Reported on 07/18/2023   [name], MD  Active Self, Pharmacy Records  Elastic Bandages & Supports (MEDICAL COMPRESSION [place]) [place] [id number] Yes Apply to feet in morning.  Remove at night before sleeping. [name], MD Taking Active Self, Pharmacy Records  empagliflozin (JARDIANCE) 25 MG TABS tablet [id number] Yes Take 1 tablet (25 mg total) by mouth daily before breakfast. [name], MD Taking Active Self, Pharmacy Records  ergocalciferol (DRISDOL) 1.25 MG (50000 UT) capsule [id number] Yes Take 1 capsule (50,000 Units total) by mouth once a week. Carlean Jews, NP Taking Active Self, Pharmacy Records  ferrous sulfate (SV IRON) 325 (65 FE) MG tablet [id number] Yes Take 1 tablet by mouth once daily [name], MD Taking Active Self, Pharmacy Records  furosemide (LASIX) 40 MG tablet [id number] Yes Take 1 tablet by mouth once daily [name], MD Taking Active Self, Pharmacy Records  glimepiride [facility]) 4 MG tablet [id number] Yes Take 1 tablet by mouth twice daily [name], MD Taking Active Self, Pharmacy Records  ipratropium (ATROVENT HFA) 17 MCG/ACT inhaler [id number] Yes Inhale 2 puffs into the lungs every 6 (six)  hours as needed for wheezing. [name], MD Taking Active Self, Pharmacy Records  ipratropium (ATROVENT) 0.02 % nebulizer solution [id number] Yes Take 2.5 mLs (0.5 mg total) by nebulization in the morning and at bedtime. [name], MD Taking Active Self, Pharmacy Records  [place] CAPS  [id number] Yes Take 1 capsule by mouth daily.  [provider] Taking Active Self, Pharmacy Records  LANTUS SOLOSTAR 100 UNIT/ML Solostar Pen [id number] Yes INJECT 20 Units in the morning and 15 units in evening [name], MD Taking Active Self, Pharmacy Records  metoprolol (TOPROL-XL) 200 MG 24 hr tablet [id number] Yes Take 0.5 tablets (100 mg total) by mouth daily. Rhetta Mura, MD Taking Active Self, Pharmacy Records  nicotine (NICODERM CQ - DOSED IN MG/24 HOURS) 21 mg/24hr patch [id number] Yes Place 1 patch (21 mg total) onto the skin daily. Pokhrel, Laxman, MD Taking Active   omeprazole (PRILOSEC) 20 MG capsule [id number] Yes Take 20 mg by mouth daily. [provider] Taking Active Self, Pharmacy Records  pregabalin (LYRICA) 75 MG capsule [id number] Yes Take 1 capsule (75 mg total) by mouth 3 (three) times daily. [name], MD Taking Active Self, Pharmacy Records  rivaroxaban Carlena Hurl) 20 MG TABS tablet [id number] Yes Take 1 tablet (20 mg total) by mouth daily. Carlean Jews, NP Taking Active Self, Pharmacy Records           Med Note Weston Settle, [facility] S   Tue Feb 21, 2023  4:03 AM)    sacubitril-valsartan (ENTRESTO) 97-103 MG [id number] Yes Take 1 tablet by mouth 2 (two) times daily. Alen Bleacher, NP Taking Active Self, Pharmacy Records  tiZANidine (ZANAFLEX) 4 MG tablet [id number] Yes Take 1 tablet (4 mg total) by mouth every 8 (eight) hours as needed for muscle spasms. [name], MD Taking Active Self, Pharmacy Records            Home Care and Equipment/Supplies: Were Home Health Services Ordered?: Yes Name of Home Health Agency:: [facility] Has Agency set up a time to come to your home?: No Any new equipment or medical supplies ordered?: Yes Name of Medical supply agency?: [facility] Were you able to get the equipment/medical supplies?: Yes Do you have any questions related to the use of the equipment/supplies?: No  Functional Questionnaire: Do you need  assistance with bathing/showering or dressing?: No Do you need assistance with meal preparation?: No Do you need assistance with eating?: No Do you have difficulty maintaining continence: No Do you need assistance with getting out of bed/getting out of a chair/moving?: No Do you have difficulty managing or taking your medications?: No  Follow up appointments reviewed: PCP Follow-up appointment confirmed?: Yes Date of PCP follow-up appointment?: 08/02/23 Follow-up Provider: [facility] Follow-up appointment confirmed?: NA Do you need transportation to your follow-up appointment?: No Do you understand care options if your condition(s) worsen?: Yes-patient verbalized understanding  SDOH Interventions Today    Flowsheet Row Most Recent Value  SDOH Interventions   Food Insecurity Interventions Intervention Not Indicated  Housing Interventions Intervention Not Indicated  Transportation Interventions Intervention Not Indicated, Patient Resources (Friends/Family)  Utilities Interventions Intervention Not Indicated      Interventions Today    Flowsheet Row Most Recent Value  Chronic Disease   Chronic disease during today's visit Chronic Obstructive Pulmonary Disease (COPD)  [Acute respiratory failure with hypoxia]  General Interventions   General Interventions Discussed/Reviewed General Interventions Discussed, General Interventions Reviewed, Doctor Visits  Doctor Visits Discussed/Reviewed Doctor Visits  Discussed, Doctor Visits Reviewed  Nutrition Interventions   Nutrition Discussed/Reviewed Nutrition Discussed, Nutrition Reviewed  Pharmacy Interventions   Pharmacy Dicussed/Reviewed Pharmacy Topics Discussed, Pharmacy Topics Reviewed        Gean Maidens BSN RN [facility] [place] [facility] Health Care Management Coordinator [email or web address] Direct Dial: [phone]  Fax: [phone] Website: [email or web address]

## 2023-08-02 ENCOUNTER — Ambulatory Visit (INDEPENDENT_AMBULATORY_CARE_PROVIDER_SITE_OTHER): Admitting: Family Medicine

## 2023-08-02 ENCOUNTER — Encounter: Payer: Self-pay | Admitting: Family Medicine

## 2023-08-02 DIAGNOSIS — R809 Proteinuria, unspecified: Secondary | ICD-10-CM

## 2023-08-02 DIAGNOSIS — Z794 Long term (current) use of insulin: Secondary | ICD-10-CM | POA: Diagnosis not present

## 2023-08-02 DIAGNOSIS — R062 Wheezing: Secondary | ICD-10-CM

## 2023-08-02 DIAGNOSIS — E119 Type 2 diabetes mellitus without complications: Secondary | ICD-10-CM

## 2023-08-02 DIAGNOSIS — E1129 Type 2 diabetes mellitus with other diabetic kidney complication: Secondary | ICD-10-CM

## 2023-08-02 DIAGNOSIS — J439 Emphysema, unspecified: Secondary | ICD-10-CM

## 2023-08-02 MED ORDER — PREGABALIN 75 MG PO CAPS
75.0000 mg | ORAL_CAPSULE | Freq: Three times a day (TID) | ORAL | 3 refills | Status: DC
Start: 2023-08-02 — End: 2024-01-08

## 2023-08-02 MED ORDER — IPRATROPIUM BROMIDE 0.02 % IN SOLN: 0.5000 mg | Freq: Two times a day (BID) | RESPIRATORY_TRACT | 5 refills | Status: DC

## 2023-08-02 MED ORDER — BUDESONIDE 0.25 MG/2ML IN SUSP: 0.5000 mg | Freq: Every day | RESPIRATORY_TRACT | 3 refills | Status: DC

## 2023-08-02 MED ORDER — NICOTINE 21 MG/24HR TD PT24: 21.0000 mg | MEDICATED_PATCH | Freq: Every day | TRANSDERMAL | 2 refills | Status: DC

## 2023-08-02 MED ORDER — EMPAGLIFLOZIN 25 MG PO TABS: 25.0000 mg | ORAL_TABLET | Freq: Every day | ORAL | 5 refills | Status: AC

## 2023-08-02 MED ORDER — GLIMEPIRIDE 4 MG PO TABS: 4.0000 mg | ORAL_TABLET | Freq: Every day | ORAL | Status: DC

## 2023-08-02 NOTE — Progress Notes (Unsigned)
   Established Patient Office Visit  Subjective   Patient ID: Kaitlin Cantrell, female    DOB: [birth year]  Age: 68 y.o. MRN: [id number]  Chief Complaint  Patient presents with  . Hospitalization Follow-up    HPI  Pneumonia - smoking cessatoin.  Oxygn.  2L.  Wears I tto sleep.    Refills of what?   Quit smoking - fuse inhaler.    Needs portable oxygen - [employer]tech.  No small tank.   Refill   Nicotine   Oxygen?     The 10-year ASCVD risk score (Arnett DK, et al., 2019) is: 22.9%  Health Maintenance Due  Topic Date Due  . COVID-19 Vaccine (1) Never done  . Zoster Vaccines- Shingrix (1 of 2) 12/06/1976  . Cervical Cancer Screening (HPV/Pap Cotest)  03/10/2014  . OPHTHALMOLOGY EXAM  03/30/2017  . Medicare Annual Wellness (AWV)  12/28/2017  . MAMMOGRAM  11/22/2019  . DEXA SCAN  Never done      Objective:     BP 117/72   Pulse (!) 51   Ht 5\' 6"  (1.676 m)   Wt 251 lb 6.4 oz (114 kg)   SpO2 99%   BMI 40.58 kg/m  {Vitals History (Optional):23777}  Physical Exam   No results found for any visits on 08/02/23.      Assessment & Plan:   There are no diagnoses linked to this encounter.   No follow-ups on file.    [name], MD

## 2023-08-02 NOTE — Patient Instructions (Addendum)
 It was nice to see you today,  We addressed the following topics today: -I would like you to stop taking your evening dose of glipizide.  For now only take the morning dose - I want you to try adjusting your insulin using the following steps: 1.  Check your blood sugar level first thing in the morning before you eat anything.  This is your fasting blood sugar 2.  If your fasting blood sugar is greater than 140, increase your morning dose of Lantus by 2 units. 3.  Keep checking your fasting blood sugar and increasing the Lantus each day until your fasting blood sugars get below 140 4.  If the fasting blood sugar is ever less than 100 you can decrease the morning insulin dose by 2 units.  - I will send in a nicotine patch order.  I will also look into a nicotine pen. - I will send in refills of your other medications. - I will send in a referral to the pulmonologist.  Is a good idea to schedule an appointment with a pulmonologist given your pulmonary issues - Congratulations on quitting smoking.  Have a great day,  Frederic Jericho, MD

## 2023-08-03 ENCOUNTER — Encounter: Payer: Self-pay | Admitting: Family Medicine

## 2023-08-03 LAB — BASIC METABOLIC PANEL WITH GFR
BUN/Creatinine Ratio: 15 (ref 12–28)
BUN: 26 mg/dL (ref 8–27)
CO2: 23 mmol/L (ref 20–29)
Calcium: 9.4 mg/dL (ref 8.7–10.3)
Chloride: 102 mmol/L (ref 96–106)
Creatinine, Ser: 1.78 mg/dL — ABNORMAL HIGH (ref 0.57–1.00)
Glucose: 153 mg/dL — ABNORMAL HIGH (ref 70–99)
Potassium: 5 mmol/L (ref 3.5–5.2)
Sodium: 145 mmol/L — ABNORMAL HIGH (ref 134–144)
eGFR: 31 mL/min/{1.73_m2} — ABNORMAL LOW (ref >59)

## 2023-08-03 LAB — CBC
Hematocrit: 36.5 % (ref 34.0–46.6)
Hemoglobin: 11.4 g/dL (ref 11.1–15.9)
MCH: 28.2 pg (ref 26.6–33.0)
MCHC: 31.2 g/dL — ABNORMAL LOW (ref 31.5–35.7)
MCV: 90 fL (ref 79–97)
Platelets: 249 10*3/uL (ref 150–450)
RBC: 4.04 x10E6/uL (ref 3.77–5.28)
RDW: 13.1 % (ref 11.7–15.4)
WBC: 9.7 10*3/uL (ref 3.4–10.8)

## 2023-08-03 LAB — MAGNESIUM: Magnesium: 2.1 mg/dL (ref 1.6–2.3)

## 2023-08-03 MED ORDER — NICOTINE 10 MG IN INHA
1.0000 | RESPIRATORY_TRACT | 2 refills | Status: DC | PRN
Start: 2023-08-03 — End: 2023-09-20

## 2023-08-03 NOTE — Assessment & Plan Note (Addendum)
 Now on supp oxygen.  Pt agreeable to pulm referral now.  Continue pulmicort and atrovent nebs. Has not smoked since discharge from hospital.  Sending in nicotine patch refill and nicotrol inhaler

## 2023-08-03 NOTE — Assessment & Plan Note (Signed)
 On jardiance, lantus and glimiperide bid.  Given her ckd we will stop the glimepiride in the afternoon and eventually titrate off completely. Showed pt how to titrate up her insulin based on fasting blood sugar. . F/u 3 months.

## 2023-08-07 ENCOUNTER — Other Ambulatory Visit: Payer: Self-pay | Admitting: Nurse Practitioner

## 2023-08-07 ENCOUNTER — Telehealth: Payer: Self-pay | Admitting: *Deleted

## 2023-08-07 DIAGNOSIS — E559 Vitamin D deficiency, unspecified: Secondary | ICD-10-CM

## 2023-08-07 NOTE — Telephone Encounter (Signed)
 Copied from CRM [id number]. Topic: Clinical - Lab/Test Results >> Aug 07, 2023 10:29 AM Fonda Kinder J wrote: Reason for CRM: Dianne from [facility] called in to report 'Lung-RADS 4A, suspicious.' For the pts recent CT scan.

## 2023-08-08 ENCOUNTER — Other Ambulatory Visit: Payer: Self-pay | Admitting: Family Medicine

## 2023-08-08 DIAGNOSIS — R918 Other nonspecific abnormal finding of lung field: Secondary | ICD-10-CM

## 2023-08-08 NOTE — Progress Notes (Signed)
 Ct chest

## 2023-08-26 ENCOUNTER — Other Ambulatory Visit: Payer: Self-pay | Admitting: Family Medicine

## 2023-08-26 DIAGNOSIS — N183 Chronic kidney disease, stage 3 unspecified: Secondary | ICD-10-CM

## 2023-08-26 DIAGNOSIS — E1129 Type 2 diabetes mellitus with other diabetic kidney complication: Secondary | ICD-10-CM

## 2023-09-02 ENCOUNTER — Other Ambulatory Visit: Payer: Self-pay | Admitting: Family Medicine

## 2023-09-04 ENCOUNTER — Ambulatory Visit: Payer: PPO | Admitting: Family Medicine

## 2023-09-08 ENCOUNTER — Other Ambulatory Visit: Payer: Self-pay | Admitting: Family Medicine

## 2023-09-19 ENCOUNTER — Telehealth (HOSPITAL_COMMUNITY): Payer: Self-pay | Admitting: Cardiology

## 2023-09-19 NOTE — Telephone Encounter (Signed)
 Called to confirm/remind patient of their appointment at the [facility] on 09/19/23.   Appointment:   [] Confirmed  [x] Left mess   [] No answer/No voice mail  [] VM Full/unable to leave message  [] Phone not in service  Patient reminded to bring all medications and/or complete list.  Confirmed patient has transportation. Gave directions, instructed to utilize valet parking.

## 2023-09-20 ENCOUNTER — Ambulatory Visit (HOSPITAL_COMMUNITY): Payer: Self-pay | Admitting: Cardiology

## 2023-09-20 ENCOUNTER — Encounter: Payer: Self-pay | Admitting: Family Medicine

## 2023-09-20 ENCOUNTER — Ambulatory Visit (HOSPITAL_COMMUNITY)
Admission: RE | Admit: 2023-09-20 | Discharge: 2023-09-20 | Disposition: A | Discharge status: Home / Self Care | Source: Ambulatory Visit | Attending: Cardiology | Admitting: Cardiology

## 2023-09-20 VITALS — BP 102/64 | HR 52 | Wt 253.0 lb

## 2023-09-20 DIAGNOSIS — I5032 Chronic diastolic (congestive) heart failure: Secondary | ICD-10-CM | POA: Insufficient documentation

## 2023-09-20 DIAGNOSIS — Z7984 Long term (current) use of oral hypoglycemic drugs: Secondary | ICD-10-CM | POA: Diagnosis not present

## 2023-09-20 DIAGNOSIS — Z8249 Family history of ischemic heart disease and other diseases of the circulatory system: Secondary | ICD-10-CM | POA: Insufficient documentation

## 2023-09-20 DIAGNOSIS — Z86711 Personal history of pulmonary embolism: Secondary | ICD-10-CM | POA: Diagnosis not present

## 2023-09-20 DIAGNOSIS — Z79899 Other long term (current) drug therapy: Secondary | ICD-10-CM | POA: Diagnosis not present

## 2023-09-20 DIAGNOSIS — N183 Chronic kidney disease, stage 3 unspecified: Secondary | ICD-10-CM | POA: Insufficient documentation

## 2023-09-20 DIAGNOSIS — J439 Emphysema, unspecified: Secondary | ICD-10-CM | POA: Diagnosis not present

## 2023-09-20 DIAGNOSIS — I428 Other cardiomyopathies: Secondary | ICD-10-CM | POA: Insufficient documentation

## 2023-09-20 DIAGNOSIS — I447 Left bundle-branch block, unspecified: Secondary | ICD-10-CM | POA: Insufficient documentation

## 2023-09-20 DIAGNOSIS — Z7901 Long term (current) use of anticoagulants: Secondary | ICD-10-CM | POA: Insufficient documentation

## 2023-09-20 DIAGNOSIS — G894 Chronic pain syndrome: Secondary | ICD-10-CM | POA: Diagnosis not present

## 2023-09-20 DIAGNOSIS — E1122 Type 2 diabetes mellitus with diabetic chronic kidney disease: Secondary | ICD-10-CM | POA: Diagnosis not present

## 2023-09-20 DIAGNOSIS — R918 Other nonspecific abnormal finding of lung field: Secondary | ICD-10-CM | POA: Diagnosis not present

## 2023-09-20 DIAGNOSIS — Z833 Family history of diabetes mellitus: Secondary | ICD-10-CM | POA: Diagnosis not present

## 2023-09-20 DIAGNOSIS — I130 Hypertensive heart and chronic kidney disease with heart failure and stage 1 through stage 4 chronic kidney disease, or unspecified chronic kidney disease: Secondary | ICD-10-CM | POA: Insufficient documentation

## 2023-09-20 DIAGNOSIS — I5041 Acute combined systolic (congestive) and diastolic (congestive) heart failure: Secondary | ICD-10-CM | POA: Diagnosis not present

## 2023-09-20 DIAGNOSIS — I4892 Unspecified atrial flutter: Secondary | ICD-10-CM | POA: Diagnosis not present

## 2023-09-20 DIAGNOSIS — Z87891 Personal history of nicotine dependence: Secondary | ICD-10-CM | POA: Insufficient documentation

## 2023-09-20 DIAGNOSIS — Z794 Long term (current) use of insulin: Secondary | ICD-10-CM | POA: Insufficient documentation

## 2023-09-20 LAB — CBC
HCT: 39.2 % (ref 36.0–46.0)
Hemoglobin: 12 g/dL (ref 12.0–15.0)
MCH: 28.5 pg (ref 26.0–34.0)
MCHC: 30.6 g/dL (ref 30.0–36.0)
MCV: 93.1 fL (ref 80.0–100.0)
Platelets: 254 10*3/uL (ref 150–400)
RBC: 4.21 MIL/uL (ref 3.87–5.11)
RDW: 13.1 % (ref 11.5–15.5)
WBC: 9.9 10*3/uL (ref 4.0–10.5)
nRBC: 0 % (ref 0.0–0.2)

## 2023-09-20 LAB — BRAIN NATRIURETIC PEPTIDE: B Natriuretic Peptide: 27.8 pg/mL (ref 0.0–100.0)

## 2023-09-20 LAB — BASIC METABOLIC PANEL WITH GFR
Anion gap: 12 (ref 5–15)
BUN: 60 mg/dL — ABNORMAL HIGH (ref 8–23)
CO2: 21 mmol/L — ABNORMAL LOW (ref 22–32)
Calcium: 9.2 mg/dL (ref 8.9–10.3)
Chloride: 105 mmol/L (ref 98–111)
Creatinine, Ser: 2.79 mg/dL — ABNORMAL HIGH (ref 0.44–1.00)
GFR, Estimated: 18 mL/min — ABNORMAL LOW (ref >60)
Glucose, Bld: 157 mg/dL — ABNORMAL HIGH (ref 70–99)
Potassium: 4.9 mmol/L (ref 3.5–5.1)
Sodium: 138 mmol/L (ref 135–145)

## 2023-09-20 LAB — LIPID PANEL
Cholesterol: 171 mg/dL (ref 0–200)
HDL: 29 mg/dL — ABNORMAL LOW (ref >40)
LDL Cholesterol: 115 mg/dL — ABNORMAL HIGH (ref 0–99)
Total CHOL/HDL Ratio: 5.9 ratio
Triglycerides: 134 mg/dL (ref <150)
VLDL: 27 mg/dL (ref 0–40)

## 2023-09-20 MED ORDER — AMLODIPINE BESYLATE 2.5 MG PO TABS: 2.5000 mg | ORAL_TABLET | Freq: Every day | ORAL | 3 refills | Status: AC

## 2023-09-20 NOTE — Patient Instructions (Signed)
 DECREASE Amlodipine  to 2.5 mg daily.  Labs done today, your results will be available in MyChart, we will contact you for abnormal readings.  You have been referred to Pulmonology. They will call you to arrange your appointment.  Your physician recommends that you schedule a follow-up appointment in: 4 months.  If you have any questions or concerns before your next appointment please send us  a message through [place] or call our office at [phone].    TO LEAVE A MESSAGE FOR THE NURSE SELECT OPTION 2, PLEASE LEAVE A MESSAGE INCLUDING: YOUR NAME DATE OF BIRTH CALL BACK NUMBER REASON FOR CALL**this is important as we prioritize the call backs  YOU WILL RECEIVE A CALL BACK THE SAME DAY AS LONG AS YOU CALL BEFORE 4:00 PM  At the [facility], you and your health needs are our priority. As part of our continuing mission to provide you with exceptional heart care, we have created designated Provider Care Teams. These Care Teams include your primary Cardiologist (physician) and Advanced Practice Providers (APPs- Physician Assistants and Nurse Practitioners) who all work together to provide you with the care you need, when you need it.   You may see any of the following providers on your designated Care Team at your next follow up: Dr Jules Oar Dr Peder Bourdon Dr. Alwin Baars Dr. Arta Lark Amy Marijane Shoulders, NP Ruddy Corral, [place] [facility] Zimm[place], [place] Dennise Fitz, NP [place] Lee, NP [place] Dellen, NP Luster Salters, PharmD Bevely Brush, PharmD   Please be sure to bring in all your medications bottles to every appointment.    Thank you for choosing [facility]

## 2023-09-20 NOTE — Progress Notes (Signed)
 Date:  09/20/2023   ID:  [name], DOB [birth year], MRN [id number]   Provider location: [place] [facility] Type of Visit: Established patient   PCP:  Laneta Pintos, MD  Cardiologist:  Dr. Mitzie Anda  Chief complaint: CHF   History of Present Illness: Kaitlin Cantrell is a 68 y.o. female who has a history of COPD, tobacco abuse, DM, HTN, arthritis, and chronic pain syndrome and presented to [facility] with SOB in 8/17.  She had had several months of increasing exertional dyspnea prior to this.  CT angio 12/29/15 showed submassive PE with concerns for right heart strain. Now on Xarelto .  Echo 12/30/15 showed LVEF 20-25%, Grade 3 DD, Mod MR, mod LAE, Mild RV dilation with mildly reduced systolic function, Moderate RAE, Moderate TR. Peak PA pressure 54 mm Hg.  Cardiac MRI (9/17) showed EF 15%, moderate LV dilation, mild RV dilation/moderately decreased RV systolic function, no LGE.  She was diuresed in the hospital and begun on Xarelto .   Smoked 1 ppd for many years.  She tried Chantix  but did not stop. No ETOH or drug use.  No history of cancer or recent surgery, no long car/plane trips prior to her PE.    In 9/17, spironolactone  was discontinued due to persistent hyperkalemia.   RHC/LHC in 12/17 showed no significant CAD, optimized filling pressures, low but not markedly low cardiac output.    Echo in 3/18 showed  EF improved to 55% with mild LVH.  Repeat echo 7/20, EF 55-60%.     She was admitted 8/21 for COVID PNA. Hospital course c/b atrial flutter w/ RVR. She did not require intubation. She was seen by general cardiology w/ recommendations to treat w/ rate control w/ metoprolol  + continuation of Xarelto . Recommend eventual DCCV once infection cleared.   She had successful TEE/DCCV to NSR in 9/21. TEE showed normal EF 55%.    Echo 2/24 was done because of new LBBB and showed EF 60-65%, normal RV, mild MR/TR  Patient was admitted in 3/25 with COPD exacerbation and PNA.   She quit smoking after this admission.   Today she returns for followup of CHF.  She is staying off cigarettes.  She uses oxygen  at night now.  She is mildly lightheaded with standing.  She is short of breath walking 50 feet and with most moderate exertion, this is chronic. No chest pain.  No orthopnea/PND. No BRBPR/melena.  She has chronic low back pain. Weight is down 11 lbs.   ECG (personally reviewed): NSR, LBBB 124 msec  Labs (10/23): LDL 80, HDL 33, K 5.2, creatinine 5.78 Labs (2/24): K 4.6, SCr 1.3 Labs (4/25): K 5, creatinine 1.78  PMH: 1. PE: 12/2015 CT angio 12/29/15 submassive PE with concerns for right heart strain. Now on Xarelto .  2. Cardiomyopathy:  Found 8/17.  - CMRI 01/01/2016: Nonischemic cardiomyopathy.  EF 15%, moderate LV dilation, mild RV dilation/moderately decreased RV systolic function.  No LGE: no infiltrative or inflammatory process noted, no evidence for prior MI. - ECHO  12/30/2015: EF 20-25%. Grade III DD, Mod TR, PASP 54, mildly decreased RV systolic function.  - Hyperkalemia with spironolactone .  - LHC/RHC (12/17): No CAD, mean RA 2, PA 39/13 mean 23, mean PCWP 7, CI 2.1.  - Echo (3/18): EF 55%, mild LVH.  - Echo (7/20): EF 55-60%, normal RV.  - TEE (9/21): EF 55%, RV normal, mild MR.  - Echo 2/24 2/2 new LBBB: EF 60-65%, normal RV, mild MR/TR  3. ABIs 01/05/2016 normal   4. COPD: Active smoker.  5. Type II diabetes 6. HTN 7. Chronic pain syndrome 8. CKD stage 3: Suspect diabetic nephropathy.  9. Atrial Flutter: DCCV to NSR in 9/21.    Current Outpatient Medications  Medication Sig Dispense Refill   acetaminophen  (TYLENOL ) 500 MG tablet Take 1,000 mg by mouth every 6 (six) hours as needed for moderate pain (pain score 4-6).     atorvastatin  (LIPITOR ) 80 MG tablet TAKE 1 TABLET BY MOUTH ONCE DAILY AT  6PM 90 tablet 3   budesonide  (PULMICORT ) 0.25 MG/2ML nebulizer solution Take 4 mLs (0.5 mg total) by nebulization daily. 120 mL 3   empagliflozin  (JARDIANCE ) 25  MG TABS tablet Take 1 tablet (25 mg total) by mouth daily before breakfast. 30 tablet 5   ferrous sulfate  (SV IRON ) 325 (65 FE) MG tablet Take 1 tablet by mouth once daily 30 tablet 2   furosemide  (LASIX ) 40 MG tablet Take 1 tablet by mouth once daily 90 tablet 1   glimepiride  (AMARYL ) 4 MG tablet Take 1 tablet by mouth twice daily (Patient taking differently: Take 4 mg by mouth daily.) 180 tablet 1   ipratropium (ATROVENT  HFA) 17 MCG/ACT inhaler Inhale 2 puffs into the lungs every 6 (six) hours as needed for wheezing. 1 each 11   ipratropium (ATROVENT ) 0.02 % nebulizer solution Take 2.5 mLs (0.5 mg total) by nebulization in the morning and at bedtime. 75 mL 5   [place] CAPS Take 1 capsule by mouth daily.      LANTUS  SOLOSTAR 100 UNIT/ML Solostar Pen INJECT 20 Units in the morning and 15 units in evening (Patient taking differently: INJECT 25 Units in the morning and 20 units in evening) 15 mL 3   metoprolol  (TOPROL -XL) 200 MG 24 hr tablet Take 0.5 tablets (100 mg total) by mouth daily. 90 tablet 3   omeprazole  (PRILOSEC) 20 MG capsule Take 20 mg by mouth daily.     pregabalin  (LYRICA ) 75 MG capsule Take 1 capsule (75 mg total) by mouth 3 (three) times daily. 90 capsule 3   rivaroxaban  (XARELTO ) 20 MG TABS tablet Take 1 tablet (20 mg total) by mouth daily. 90 tablet 3   sacubitril -valsartan  (ENTRESTO ) 97-103 MG Take 1 tablet by mouth 2 (two) times daily. 60 tablet 11   tiZANidine  (ZANAFLEX ) 4 MG tablet TAKE 1 TABLET BY MOUTH EVERY 8 HOURS AS NEEDED FOR MUSCLE SPASM 90 tablet 0   Vitamin D , Ergocalciferol , (DRISDOL ) 1.25 MG (50000 UNIT) CAPS capsule Take 1 capsule by mouth once a week (Patient taking differently: Take 50,000 Units by mouth once a week. Takes on Wednesdays) 12 capsule 0   amLODipine  (NORVASC ) 2.5 MG tablet Take 1 tablet (2.5 mg total) by mouth daily. 90 tablet 3   Continuous Glucose Sensor (FREESTYLE LIBRE 3 PLUS SENSOR) MISC Change sensor every 15 days. (Patient not taking: Reported  on 09/20/2023) 2 each 5   Elastic Bandages & Supports (MEDICAL COMPRESSION [place]) MISC Apply to feet in morning.  Remove at night before sleeping. (Patient not taking: Reported on 09/20/2023) 2 each 1   No current facility-administered medications for this encounter.    Allergies:   Albuterol  and Morphine and codeine   Social History:  The patient  reports that she has been smoking cigarettes. She has a 45 pack-year smoking history. She has been exposed to tobacco smoke. She has never used smokeless tobacco. She reports current alcohol use. She reports that she does not use drugs.  Family History:  The patient's family history includes COPD in her paternal grandfather; Cancer in her maternal grandmother; Colon polyps in her father; Diabetes in her brother, father, mother, and paternal grandmother; Heart disease in her father, paternal grandfather, and paternal grandmother; Heart murmur in her mother; Hypertension in her brother, brother, and mother.   ROS:  Please see the history of present illness.   All other systems are personally reviewed and negative.   Vitals:   BP 102/64   Pulse (!) 52   Wt 114.8 kg (253 lb)   SpO2 93%   BMI 40.84 kg/m   Physical exam: General: NAD Neck: No JVD, no thyromegaly or thyroid  nodule.  Lungs: Distant BS CV: Nondisplaced PMI.  Heart regular S1/S2, no S3/S4, no murmur.  No peripheral edema.  No carotid bruit.  Normal pedal pulses.  Abdomen: Soft, nontender, no hepatosplenomegaly, no distention.  Skin: Intact without lesions or rashes.  Neurologic: Alert and oriented x 3.  Psych: Normal affect. Extremities: No clubbing or cyanosis.  HEENT: Normal.   Wt Readings from Last 3 Encounters:  09/20/23 114.8 kg (253 lb)  08/02/23 114 kg (251 lb 6.4 oz)  07/22/23 121.6 kg (268 lb 1.3 oz)    ASSESSMENT AND PLAN: 1. Chronic HF with recovered EF: Nonischemic cardiomyopathy.  ECHO 12/2015 EF 15-20% with diffuse hypokinesis.  Cardiac MRI did not show LGE.  HIV negative.  12/17 RHC/LHC showed no angiographic CAD, optimized filling pressures, low but not markedly low cardiac output.  Echo 3/18 showed recovery of LV function with EF up to 55%. Echo repeated 7/20 and EF still 55-60%. TEE in 9/21 showed EF 55%. Echo in 2/24 done because of new LBBB showed EF 60-65%, normal RV, mild MR/TR.  Patient has chronic NYHA class III symptoms but is not volume overloaded on exam.  I suspect that COPD is playing a major role.  - Continue Toprol  XL 200 mg daily  - Continue Entresto  97/103 bid.   - Off spironolactone  with persistent hyperkalemia.   - Continue Jardiance .  - Check BMET/BNP today, if creatinine is elevated, would favor cutting back on Lasix  as she does not look volume overloaded.  2. H/O PE: Submassive on CT.  Etiology uncertain: no known cancer, long trip, recent surgery.  Possibly related to stasis in the setting of cardiomyopathy with severely decreased systolic function.  - Given cardiomyopathy and submassive PE with no certain trigger, she has been continued on long-term anticoagulation. Remains on Xarelto . CBC today.  2. Atrial Flutter: DCCV in 9/21.  She is in NSR today.  - Continue Xarelto , CBC today.  - Continue metoprolol   3. CKD: Stage III. BMET today.  4. COPD: Emphysema on CT chest.  She stopped smoking in 3/25.  Her chest CT in 3/25 showed worrisome lung nodules.  - She needs a pulmonary referral, I will make this today.  5. HTN: Patient's BP is now running on the lower side and she has orthostatic symptoms.  - Decrease amlodipine  to 2.5 mg daily.  If she continues to have orthostatic symptoms, can stop altogether.   Followup 4 months with APP.   I spent 32 minutes reviewing records, interviewing/examining patient, and managing orders.   Signed, Peder Bourdon, MD  09/20/2023  [facility] [address] [facility] [place] [address] [phone] (office) [phone] (fax)

## 2023-09-21 ENCOUNTER — Ambulatory Visit: Payer: Self-pay

## 2023-09-21 MED ORDER — FUROSEMIDE 40 MG PO TABS: 40.0000 mg | ORAL_TABLET | ORAL | Status: DC | PRN

## 2023-09-21 NOTE — Telephone Encounter (Addendum)
  Chief Complaint: Lab Order Request Additional Notes: Pt initially calling to schedule lab appt with PCP, but upon further investigation, orders from Cardiology are not in. Pt reports cardiology wants a repeat basic metabolic profile. Triager advised that appt cannot be scheduled until lab order present in Epic. Patient verbalized understanding. Triager will forward encounter for Dr Arabella Beach 's office to review and see if PCP can place order for pt to get scheduled.     Copied from CRM [id number]. Topic: Appointments - Appointment Scheduling >> Sep 21, 2023  5:11 PM Kaitlin Cantrell wrote: Pt would like to schedule an appointment for a blood draw, her cardiologist has made her aware that her GFR is down to 18. Decision tree denied scheduling Reason for Disposition  [1] Caller requesting NON-URGENT health information AND [2] PCP's office is the best resource  Answer Assessment - Initial Assessment Questions 1. REASON FOR CALL or QUESTION: "What is your reason for calling today?" or "How can I best help you?" or "What question do you have that I can help answer?"     Pt calling to schedule a lab order for basic metabolic profile  Protocols used: Information Only Call - No Triage-A-AH

## 2023-09-21 NOTE — Telephone Encounter (Addendum)
 Pt aware, agreeable, and verbalized understanding  F/u scheduled med list updated, will she if her pcp can draw labs if not she will call back to get scheduled   ----- Message from Peder Bourdon sent at 09/20/2023  2:20 PM EDT ----- For now, would have her stop Lasix  and just use it prn.  Repeat BMET in 1 week.  Have her followup with APP in 2-3 weeks to make sure she does not get volume overloaded.

## 2023-09-21 NOTE — Telephone Encounter (Signed)
 LVM to inquire about this message

## 2023-09-22 ENCOUNTER — Other Ambulatory Visit: Payer: Self-pay | Admitting: Family Medicine

## 2023-09-22 DIAGNOSIS — N183 Chronic kidney disease, stage 3 unspecified: Secondary | ICD-10-CM

## 2023-09-22 NOTE — Telephone Encounter (Signed)
 Cmp order has been placed

## 2023-09-29 ENCOUNTER — Other Ambulatory Visit

## 2023-09-29 DIAGNOSIS — N183 Chronic kidney disease, stage 3 unspecified: Secondary | ICD-10-CM

## 2023-09-30 LAB — COMPREHENSIVE METABOLIC PANEL WITH GFR

## 2023-10-01 ENCOUNTER — Ambulatory Visit: Payer: Self-pay | Admitting: Family Medicine

## 2023-10-03 ENCOUNTER — Telehealth: Payer: Self-pay | Admitting: *Deleted

## 2023-10-03 ENCOUNTER — Other Ambulatory Visit: Payer: Self-pay | Admitting: Family Medicine

## 2023-10-03 DIAGNOSIS — N183 Chronic kidney disease, stage 3 unspecified: Secondary | ICD-10-CM

## 2023-10-03 NOTE — Progress Notes (Signed)
 Date:  10/04/2023   ID:  Kaitlin Cantrell, DOB 1955-05-26, MRN [id number]   Provider location: [facility] Type of Visit: Established patient   PCP:  Laneta Pintos, MD  Cardiologist:  Dr. Mitzie Anda   HPI: Kaitlin Cantrell is a 68 y.o. female who has a history of COPD, tobacco abuse, DM, HTN, arthritis, and chronic pain syndrome and presented to [facility] with SOB in 8/17.  She had had several months of increasing exertional dyspnea prior to this.  CT angio 12/29/15 showed submassive PE with concerns for right heart strain. Now on Xarelto .  Echo 12/30/15 showed LVEF 20-25%, Grade 3 DD, Mod MR, mod LAE, Mild RV dilation with mildly reduced systolic function, Moderate RAE, Moderate TR. Peak PA pressure 54 mm Hg.  Cardiac MRI (9/17) showed EF 15%, moderate LV dilation, mild RV dilation/moderately decreased RV systolic function, no LGE.  She was diuresed in the hospital and begun on Xarelto .   Smoked 1 ppd for many years.  She tried Chantix  but did not stop. No ETOH or drug use.  No history of cancer or recent surgery, no long car/plane trips prior to her PE.    In 9/17, spironolactone  was discontinued due to persistent hyperkalemia.   RHC/LHC in 12/17 showed no significant CAD, optimized filling pressures, low but not markedly low cardiac output.    Echo in 3/18 showed  EF improved to 55% with mild LVH.  Repeat echo 7/20, EF 55-60%.     She was admitted 8/21 for COVID PNA. Hospital course c/b atrial flutter w/ RVR. She did not require intubation. She was seen by general cardiology w/ recommendations to treat w/ rate control w/ metoprolol  + continuation of Xarelto . Recommend eventual DCCV once infection cleared.   She had successful TEE/DCCV to NSR in 9/21. TEE showed normal EF 55%.    Echo 2/24 was done because of new LBBB and showed EF 60-65%, normal RV, mild MR/TR  Patient was admitted in 3/25 with COPD exacerbation and PNA.  She quit smoking after this admission.   Today  she returns for HF follow up. Overall feeling better. Dizziness improved on lower dose of amlodipine . She is SOB with walking on flat ground short distances. Uses 2L oxygen  at night. Denies palpitations, abnormal bleeding, CP, edema, or PND/Orthopnea. Appetite ok. Does not weigh at home. Taking all medications. Remains quit from tobacco x 3 months.  ECG (personally reviewed): none ordered today.  Labs (10/23): LDL 80, HDL 33, K 5.2, creatinine 6.21 Labs (2/24): K 4.6, SCr 1.3 Labs (4/25): K 5, creatinine 1.78 Labs (5/25): K 4.9, creatinine 2.79  PMH: 1. PE: 12/2015 CT angio 12/29/15 submassive PE with concerns for right heart strain. Now on Xarelto .  2. Cardiomyopathy:  Found 8/17.  - CMRI 01/01/2016: Nonischemic cardiomyopathy.  EF 15%, moderate LV dilation, mild RV dilation/moderately decreased RV systolic function.  No LGE: no infiltrative or inflammatory process noted, no evidence for prior MI. - Echo 12/30/2015: EF 20-25%. Grade III DD, Mod TR, PASP 54, mildly decreased RV systolic function.  - Hyperkalemia with spironolactone .  - LHC/RHC (12/17): No CAD, mean RA 2, PA 39/13 mean 23, mean PCWP 7, CI 2.1.  - Echo (3/18): EF 55%, mild LVH.  - Echo (7/20): EF 55-60%, normal RV.  - TEE (9/21): EF 55%, RV normal, mild MR.  - Echo 2/24: 2/2 new LBBB: EF 60-65%, normal RV, mild MR/TR 3. ABIs 01/05/2016 normal   4. COPD: Active smoker.  5. Type II diabetes 6. HTN 7. Chronic pain syndrome 8. CKD stage 3: Suspect diabetic nephropathy.  9. Atrial Flutter: DCCV to NSR in 9/21.    Current Outpatient Medications  Medication Sig Dispense Refill   acetaminophen  (TYLENOL ) 500 MG tablet Take 1,000 mg by mouth every 6 (six) hours as needed for moderate pain (pain score 4-6).     amLODipine  (NORVASC ) 2.5 MG tablet Take 1 tablet (2.5 mg total) by mouth daily. 90 tablet 3   atorvastatin  (LIPITOR ) 80 MG tablet TAKE 1 TABLET BY MOUTH ONCE DAILY AT  6PM 90 tablet 3   budesonide  (PULMICORT ) 0.25 MG/2ML  nebulizer solution Take 4 mLs (0.5 mg total) by nebulization daily. 120 mL 3   Elastic Bandages & Supports (MEDICAL COMPRESSION STOCKINGS) MISC Apply to feet in morning.  Remove at night before sleeping. 2 each 1   empagliflozin  (JARDIANCE ) 25 MG TABS tablet Take 1 tablet (25 mg total) by mouth daily before breakfast. 30 tablet 5   ferrous sulfate  (SV IRON ) 325 (65 FE) MG tablet Take 1 tablet by mouth once daily 30 tablet 2   glimepiride  (AMARYL ) 4 MG tablet Take 1 tablet by mouth twice daily (Patient taking differently: Take 4 mg by mouth daily.) 180 tablet 1   ipratropium (ATROVENT  HFA) 17 MCG/ACT inhaler Inhale 2 puffs into the lungs every 6 (six) hours as needed for wheezing. 1 each 11   ipratropium (ATROVENT ) 0.02 % nebulizer solution Take 2.5 mLs (0.5 mg total) by nebulization in the morning and at bedtime. 75 mL 5   Krill Oil CAPS Take 1 capsule by mouth daily.      LANTUS  SOLOSTAR 100 UNIT/ML Solostar Pen INJECT 20 Units in the morning and 15 units in evening (Patient taking differently: INJECT 25 Units in the morning and 20 units in evening) 15 mL 3   metoprolol  (TOPROL -XL) 200 MG 24 hr tablet Take 0.5 tablets (100 mg total) by mouth daily. 90 tablet 3   omeprazole  (PRILOSEC) 20 MG capsule Take 20 mg by mouth daily.     pregabalin  (LYRICA ) 75 MG capsule Take 1 capsule (75 mg total) by mouth 3 (three) times daily. 90 capsule 3   rivaroxaban  (XARELTO ) 20 MG TABS tablet Take 1 tablet (20 mg total) by mouth daily. 90 tablet 3   sacubitril -valsartan  (ENTRESTO ) 97-103 MG Take 1 tablet by mouth 2 (two) times daily. 60 tablet 11   tiZANidine  (ZANAFLEX ) 4 MG tablet TAKE 1 TABLET BY MOUTH EVERY 8 HOURS AS NEEDED FOR MUSCLE SPASM 90 tablet 0   Vitamin D , Ergocalciferol , (DRISDOL ) 1.25 MG (50000 UNIT) CAPS capsule Take 1 capsule by mouth once a week (Patient taking differently: Take 50,000 Units by mouth once a week. Takes on Wednesdays) 12 capsule 0   No current facility-administered medications for  this encounter.    Allergies:   Albuterol  and Morphine and codeine   Social History:  The patient  reports that she has been smoking cigarettes. She has a 45 pack-year smoking history. She has been exposed to tobacco smoke. She has never used smokeless tobacco. She reports current alcohol use. She reports that she does not use drugs.   Family History:  The patient's family history includes COPD in her paternal grandfather; Cancer in her maternal grandmother; Colon polyps in her father; Diabetes in her brother, father, mother, and paternal grandmother; Heart disease in her father, paternal grandfather, and paternal grandmother; Heart murmur in her mother; Hypertension in her brother, brother, and mother.   ROS:  Please see the history of present illness.   All other systems are personally reviewed and negative.   Vitals:  BP 128/80   Pulse (!) 57   Ht 5\' 6"  (1.676 m)   Wt 117.1 kg (258 lb 3.2 oz)   SpO2 95%   BMI 41.67 kg/m   Wt Readings from Last 3 Encounters:  10/04/23 117.1 kg (258 lb 3.2 oz)  09/20/23 114.8 kg (253 lb)  08/02/23 114 kg (251 lb 6.4 oz)   Physical Exam: General:  NAD. No resp difficulty HEENT: Normal Neck: Supple. No JVD. Cor: Regular rate & rhythm. No rubs, gallops or murmurs. Lungs: Clear Abdomen: Soft, nontender, nondistended.  Extremities: No cyanosis, clubbing, rash, edema Neuro: Alert & oriented x 3, moves all 4 extremities w/o difficulty. Affect pleasant.   ASSESSMENT AND PLAN: 1. Chronic HF with recovered EF: Nonischemic cardiomyopathy.  ECHO 12/2015 EF 15-20% with diffuse hypokinesis.  Cardiac MRI did not show LGE. HIV negative.  12/17 RHC/LHC showed no angiographic CAD, optimized filling pressures, low but not markedly low cardiac output.  Echo 3/18 showed recovery of LV function with EF up to 55%. Echo repeated 7/20 and EF still 55-60%. TEE in 9/21 showed EF 55%. Echo in 2/24 done because of new LBBB showed EF 60-65%, normal RV, mild MR/TR.  Patient has  chronic NYHA class III symptoms but is not volume overloaded on exam.  I suspect that COPD is playing a major role.  - Continue Toprol  XL 200 mg daily  - Continue Entresto  97/103 bid.   - Continue Jardiance .  - Off spironolactone  with persistent hyperkalemia.  BMET today. - Off Lasix  with elevated SCr.  2. H/O PE: Submassive on CT.  Etiology uncertain: no known cancer, long trip, recent surgery.  Possibly related to stasis in the setting of cardiomyopathy with severely decreased systolic function.  - Given cardiomyopathy and submassive PE with no certain trigger, she has been continued on long-term anticoagulation. Remains on Xarelto . Recent CBC reviewed and stable. 3. Atrial Flutter: DCCV in 9/21.  Regular on exam today. - Continue Xarelto  + metoprolol . 4. CKD: Stage III. Jardiance . - BMET today.  5. COPD: Emphysema on CT chest.  She stopped smoking in 3/25.  Her chest CT in 3/25 showed worrisome lung nodules.  - She has been referred to Pulmonary, has follow up arranged. 6. HTN: BP stable, orthostasis improved on lower dose of amlodipine . - Continue amlodipine  2.5 mg daily.  If she continues to have orthostatic symptoms, can stop altogether.   Follow up in 3-4 months with APP.  Signed, Elmarie Hacking, FNP  10/04/2023  [facility]  [address] [facility] [place] [address] [phone] (office) [phone] (fax)

## 2023-10-03 NOTE — Telephone Encounter (Signed)
 Copied from CRM [id number]. Topic: General - Other >> Oct 03, 2023  8:20 AM Zipporah Him wrote: Reason for CRM: Patient called back in regard to missed call about lab work. States she is going to see another doctor tomorrow and she will have blood redrawn there.

## 2023-10-03 NOTE — Progress Notes (Signed)
 Can you call the patient to schedule a new lab visit to repeat her recent blood test?  [facility] was unable to process it.

## 2023-10-03 NOTE — Telephone Encounter (Signed)
 LVM to come by office by 11:00 to recollect or to give us  a call to schedule appt

## 2023-10-04 ENCOUNTER — Encounter (HOSPITAL_COMMUNITY): Payer: Self-pay

## 2023-10-04 ENCOUNTER — Other Ambulatory Visit (HOSPITAL_COMMUNITY): Payer: Self-pay | Admitting: Family Medicine

## 2023-10-04 ENCOUNTER — Ambulatory Visit (HOSPITAL_COMMUNITY)
Admission: RE | Admit: 2023-10-04 | Discharge: 2023-10-04 | Disposition: A | Discharge status: Home / Self Care | Source: Ambulatory Visit | Attending: Family Medicine | Admitting: Family Medicine

## 2023-10-04 ENCOUNTER — Ambulatory Visit (HOSPITAL_COMMUNITY): Payer: Self-pay | Admitting: Family Medicine

## 2023-10-04 VITALS — BP 128/80 | HR 57 | Ht 66.0 in | Wt 258.2 lb

## 2023-10-04 DIAGNOSIS — Z79899 Other long term (current) drug therapy: Secondary | ICD-10-CM | POA: Diagnosis not present

## 2023-10-04 DIAGNOSIS — Z86711 Personal history of pulmonary embolism: Secondary | ICD-10-CM | POA: Insufficient documentation

## 2023-10-04 DIAGNOSIS — E1122 Type 2 diabetes mellitus with diabetic chronic kidney disease: Secondary | ICD-10-CM | POA: Insufficient documentation

## 2023-10-04 DIAGNOSIS — I5022 Chronic systolic (congestive) heart failure: Secondary | ICD-10-CM | POA: Diagnosis present

## 2023-10-04 DIAGNOSIS — M1990 Unspecified osteoarthritis, unspecified site: Secondary | ICD-10-CM | POA: Insufficient documentation

## 2023-10-04 DIAGNOSIS — J439 Emphysema, unspecified: Secondary | ICD-10-CM | POA: Insufficient documentation

## 2023-10-04 DIAGNOSIS — Z7901 Long term (current) use of anticoagulants: Secondary | ICD-10-CM | POA: Diagnosis not present

## 2023-10-04 DIAGNOSIS — Z87891 Personal history of nicotine dependence: Secondary | ICD-10-CM | POA: Insufficient documentation

## 2023-10-04 DIAGNOSIS — I130 Hypertensive heart and chronic kidney disease with heart failure and stage 1 through stage 4 chronic kidney disease, or unspecified chronic kidney disease: Secondary | ICD-10-CM | POA: Insufficient documentation

## 2023-10-04 DIAGNOSIS — I4892 Unspecified atrial flutter: Secondary | ICD-10-CM | POA: Insufficient documentation

## 2023-10-04 DIAGNOSIS — J449 Chronic obstructive pulmonary disease, unspecified: Secondary | ICD-10-CM

## 2023-10-04 DIAGNOSIS — I428 Other cardiomyopathies: Secondary | ICD-10-CM | POA: Diagnosis not present

## 2023-10-04 DIAGNOSIS — Z794 Long term (current) use of insulin: Secondary | ICD-10-CM | POA: Diagnosis not present

## 2023-10-04 DIAGNOSIS — E875 Hyperkalemia: Secondary | ICD-10-CM | POA: Insufficient documentation

## 2023-10-04 DIAGNOSIS — N183 Chronic kidney disease, stage 3 unspecified: Secondary | ICD-10-CM | POA: Diagnosis not present

## 2023-10-04 DIAGNOSIS — G894 Chronic pain syndrome: Secondary | ICD-10-CM | POA: Diagnosis not present

## 2023-10-04 DIAGNOSIS — Z7984 Long term (current) use of oral hypoglycemic drugs: Secondary | ICD-10-CM | POA: Insufficient documentation

## 2023-10-04 DIAGNOSIS — I10 Essential (primary) hypertension: Secondary | ICD-10-CM

## 2023-10-04 DIAGNOSIS — I420 Dilated cardiomyopathy: Secondary | ICD-10-CM

## 2023-10-04 LAB — BASIC METABOLIC PANEL WITH GFR
Anion gap: 6 (ref 5–15)
BUN: 28 mg/dL — ABNORMAL HIGH (ref 8–23)
CO2: 23 mmol/L (ref 22–32)
Calcium: 8.9 mg/dL (ref 8.9–10.3)
Chloride: 110 mmol/L (ref 98–111)
Creatinine, Ser: 1.88 mg/dL — ABNORMAL HIGH (ref 0.44–1.00)
GFR, Estimated: 29 mL/min — ABNORMAL LOW (ref >60)
Glucose, Bld: 159 mg/dL — ABNORMAL HIGH (ref 70–99)
Potassium: 6.2 mmol/L — ABNORMAL HIGH (ref 3.5–5.1)
Sodium: 139 mmol/L (ref 135–145)

## 2023-10-04 LAB — BRAIN NATRIURETIC PEPTIDE: B Natriuretic Peptide: 113 pg/mL — ABNORMAL HIGH (ref 0.0–100.0)

## 2023-10-04 NOTE — Patient Instructions (Addendum)
 Thank you for coming in today  If you had labs drawn today, any labs that are abnormal the clinic will call you No news is good news  Medications: No changes  Follow up appointments:  Your physician recommends that you schedule a follow-up appointment in:  Keep follow up as scheduled   Do the following things EVERYDAY: Weigh yourself in the morning before breakfast. Write it down and keep it in a log. Take your medicines as prescribed Eat low salt foods--Limit salt (sodium) to 2000 mg per day.  Stay as active as you can everyday Limit all fluids for the day to less than 2 liters   At the [facility], you and your health needs are our priority. As part of our continuing mission to provide you with exceptional heart care, we have created designated Provider Care Teams. These Care Teams include your primary Cardiologist (physician) and Advanced Practice Providers (APPs- Physician Assistants and Nurse Practitioners) who all work together to provide you with the care you need, when you need it.   You may see any of the following providers on your designated Care Team at your next follow up: Dr Jules Oar Dr Peder Bourdon Dr. Mimi Alt, NP Ruddy Corral, [place] [facility] [place], [place] Dennise Fitz, NP Luster Salters, PharmD   Please be sure to bring in all your medications bottles to every appointment.    Thank you for choosing [facility]  If you have any questions or concerns before your next appointment please send us  a message through [place] or call our office at [phone].    TO LEAVE A MESSAGE FOR THE NURSE SELECT OPTION 2, PLEASE LEAVE A MESSAGE INCLUDING: YOUR NAME DATE OF BIRTH CALL BACK NUMBER REASON FOR CALL**this is important as we prioritize the call backs  YOU WILL RECEIVE A CALL BACK THE SAME DAY AS LONG AS YOU CALL BEFORE 4:00 PM

## 2023-10-06 ENCOUNTER — Ambulatory Visit (HOSPITAL_COMMUNITY)
Admission: RE | Admit: 2023-10-06 | Discharge: 2023-10-06 | Disposition: A | Discharge status: Home / Self Care | Source: Ambulatory Visit | Attending: Cardiology | Admitting: Cardiology

## 2023-10-06 ENCOUNTER — Other Ambulatory Visit (HOSPITAL_COMMUNITY): Payer: Self-pay

## 2023-10-06 ENCOUNTER — Ambulatory Visit (HOSPITAL_COMMUNITY): Payer: Self-pay | Admitting: Family Medicine

## 2023-10-06 DIAGNOSIS — I420 Dilated cardiomyopathy: Secondary | ICD-10-CM | POA: Diagnosis present

## 2023-10-06 DIAGNOSIS — I5022 Chronic systolic (congestive) heart failure: Secondary | ICD-10-CM

## 2023-10-06 LAB — BASIC METABOLIC PANEL WITH GFR
Anion gap: 8 (ref 5–15)
BUN: 29 mg/dL — ABNORMAL HIGH (ref 8–23)
CO2: 21 mmol/L — ABNORMAL LOW (ref 22–32)
Calcium: 8.8 mg/dL — ABNORMAL LOW (ref 8.9–10.3)
Chloride: 110 mmol/L (ref 98–111)
Creatinine, Ser: 1.85 mg/dL — ABNORMAL HIGH (ref 0.44–1.00)
GFR, Estimated: 30 mL/min — ABNORMAL LOW (ref >60)
Glucose, Bld: 94 mg/dL (ref 70–99)
Potassium: 5.5 mmol/L — ABNORMAL HIGH (ref 3.5–5.1)
Sodium: 139 mmol/L (ref 135–145)

## 2023-10-06 MED ORDER — LOKELMA 10 G PO PACK: 10.0000 g | PACK | Freq: Every day | ORAL | 1 refills | Status: DC

## 2023-10-06 NOTE — Telephone Encounter (Signed)
 Result Note Spoke with patient regarding the following results. Patient made aware and patient verbalized understanding.   Per Dr. Mclean I would start her on Lokelma  10 g daily to be able to keep her on her meds. BMET 1 week.    New prescription sent to pharmacy- labs ordered and scheduled. Basic Metabolic Panel (BMET) [place], Arlice Bene, FNP to [facility] Triage Pool  (Selected Message)    10/06/23  2:04 PM Result Note K remains elevated, but coming down. Please have her take another dose of Lokelma , 10 g x 1 today. Repeat BMET on Monday Basic Metabolic Panel (BMET)

## 2023-10-09 ENCOUNTER — Telehealth (HOSPITAL_COMMUNITY): Payer: Self-pay | Admitting: Cardiology

## 2023-10-09 MED ORDER — LOKELMA 10 G PO PACK: 10.0000 g | PACK | Freq: Every day | ORAL | 1 refills | Status: DC

## 2023-10-09 NOTE — Telephone Encounter (Signed)
 Pt left VM on triage line reporting local pharmacy unable to get new med (lokelma ) Will need new RX to [facility]  Rx sent

## 2023-10-12 ENCOUNTER — Ambulatory Visit (HOSPITAL_COMMUNITY)
Admission: RE | Admit: 2023-10-12 | Discharge: 2023-10-12 | Disposition: A | Discharge status: Home / Self Care | Source: Ambulatory Visit | Attending: Cardiology | Admitting: Cardiology

## 2023-10-12 ENCOUNTER — Ambulatory Visit (HOSPITAL_COMMUNITY): Payer: Self-pay | Admitting: Cardiology

## 2023-10-12 DIAGNOSIS — I5022 Chronic systolic (congestive) heart failure: Secondary | ICD-10-CM | POA: Diagnosis present

## 2023-10-12 LAB — BASIC METABOLIC PANEL WITH GFR
Anion gap: 11 (ref 5–15)
BUN: 26 mg/dL — ABNORMAL HIGH (ref 8–23)
CO2: 20 mmol/L — ABNORMAL LOW (ref 22–32)
Calcium: 8.8 mg/dL — ABNORMAL LOW (ref 8.9–10.3)
Chloride: 109 mmol/L (ref 98–111)
Creatinine, Ser: 1.69 mg/dL — ABNORMAL HIGH (ref 0.44–1.00)
GFR, Estimated: 33 mL/min — ABNORMAL LOW (ref >60)
Glucose, Bld: 95 mg/dL (ref 70–99)
Potassium: 4.6 mmol/L (ref 3.5–5.1)
Sodium: 140 mmol/L (ref 135–145)

## 2023-10-13 ENCOUNTER — Other Ambulatory Visit: Payer: Self-pay | Admitting: Family Medicine

## 2023-10-17 ENCOUNTER — Other Ambulatory Visit: Payer: Self-pay | Admitting: Nurse Practitioner

## 2023-10-17 DIAGNOSIS — I2699 Other pulmonary embolism without acute cor pulmonale: Secondary | ICD-10-CM

## 2023-10-17 MED ORDER — RIVAROXABAN 20 MG PO TABS: 20.0000 mg | ORAL_TABLET | Freq: Every day | ORAL | 3 refills | Status: AC

## 2023-10-23 ENCOUNTER — Other Ambulatory Visit: Payer: Self-pay | Admitting: Family Medicine

## 2023-10-26 ENCOUNTER — Other Ambulatory Visit: Payer: Self-pay | Admitting: Medical Genetics

## 2023-10-30 ENCOUNTER — Telehealth: Payer: Self-pay

## 2023-10-30 NOTE — Telephone Encounter (Signed)
 Good Morning Kaitlin Cantrell,  We have received a surgical clearance request for Kaitlin Cantrell for epidural ESI procedure. They were seen recently in clinic on 10/04/2023. Can you please comment on surgical clearance for upcoming ESI procedure.  Please forward you guidance and recommendations to P CV DIV PREOP   Thanks, Jackee Alberts, NP

## 2023-10-30 NOTE — Telephone Encounter (Signed)
   Patient Name: Kaitlin Cantrell  DOB: Mar 26, 1956 MRN: [id number]  Primary Cardiologist: None  Chart reviewed as part of pre-operative protocol coverage. Given past medical history and time since last visit, based on ACC/AHA guidelines, Kaitlin Cantrell is at acceptable risk for the planned procedure without further cardiovascular testing.   She is at acceptable CV risk for procedure. RCRI score 2 pts (10.1% major cardiac event).   Xarelto  is currently not managed by cardiology and guidance should come from prescribing provider  The patient was advised that if she develops new symptoms prior to surgery to contact our office to arrange for a follow-up visit, and she verbalized understanding.  I will route this recommendation to the requesting party via Epic fax function and remove from pre-op pool.  Please call with questions.  Wyn Raddle, Jackee Shove, NP 10/30/2023, 12:57 PM

## 2023-10-30 NOTE — Telephone Encounter (Signed)
..     Pre-operative Risk Assessment    Patient Name: Kaitlin Cantrell  DOB: 01-12-1956 MRN: [id number]   Date of last office visit: 10/04/23 Date of next office visit: 01/10/24   Request for Surgical Clearance    Procedure:  L2-L3 INTERIAMINAR EPIDURAL STEROID INJECTION  Date of Surgery:  Clearance TBD                                Surgeon:  DR LAQUETA Surgeon's Group or Practice Name:  [facility] Phone number:  [phone] Fax number:  [phone]   Type of Clearance Requested:   - Medical  - Pharmacy:  Hold Rivaroxaban  (Xarelto ) HOLD FOR 3 DAYS   Type of Anesthesia:  Not Indicated   Additional requests/questions:    Bonney Teressa Rumalda Ronal   10/30/2023, 11:22 AM

## 2023-11-01 ENCOUNTER — Encounter: Payer: Self-pay | Admitting: Family Medicine

## 2023-11-01 ENCOUNTER — Ambulatory Visit (INDEPENDENT_AMBULATORY_CARE_PROVIDER_SITE_OTHER): Admitting: Family Medicine

## 2023-11-01 VITALS — BP 136/81 | HR 57 | Ht 66.0 in | Wt 263.8 lb

## 2023-11-01 DIAGNOSIS — E1159 Type 2 diabetes mellitus with other circulatory complications: Secondary | ICD-10-CM | POA: Diagnosis not present

## 2023-11-01 DIAGNOSIS — I152 Hypertension secondary to endocrine disorders: Secondary | ICD-10-CM

## 2023-11-01 DIAGNOSIS — E1129 Type 2 diabetes mellitus with other diabetic kidney complication: Secondary | ICD-10-CM

## 2023-11-01 DIAGNOSIS — E785 Hyperlipidemia, unspecified: Secondary | ICD-10-CM

## 2023-11-01 DIAGNOSIS — E1169 Type 2 diabetes mellitus with other specified complication: Secondary | ICD-10-CM | POA: Diagnosis not present

## 2023-11-01 DIAGNOSIS — E119 Type 2 diabetes mellitus without complications: Secondary | ICD-10-CM

## 2023-11-01 DIAGNOSIS — R809 Proteinuria, unspecified: Secondary | ICD-10-CM | POA: Diagnosis not present

## 2023-11-01 DIAGNOSIS — E559 Vitamin D deficiency, unspecified: Secondary | ICD-10-CM

## 2023-11-01 DIAGNOSIS — I5041 Acute combined systolic (congestive) and diastolic (congestive) heart failure: Secondary | ICD-10-CM

## 2023-11-01 DIAGNOSIS — Z794 Long term (current) use of insulin: Secondary | ICD-10-CM | POA: Diagnosis not present

## 2023-11-01 DIAGNOSIS — J439 Emphysema, unspecified: Secondary | ICD-10-CM

## 2023-11-01 LAB — POCT GLYCOSYLATED HEMOGLOBIN (HGB A1C): HbA1c POC (<> result, manual entry): 7 % (ref 4.0–5.6)

## 2023-11-01 MED ORDER — TIZANIDINE HCL 4 MG PO TABS: ORAL_TABLET | ORAL | 0 refills | Status: DC

## 2023-11-01 MED ORDER — VITAMIN D (ERGOCALCIFEROL) 1.25 MG (50000 UNIT) PO CAPS: 50000.0000 [IU] | ORAL_CAPSULE | ORAL | 0 refills | Status: DC

## 2023-11-01 MED ORDER — FERROUS SULFATE 325 (65 FE) MG PO TABS: 325.0000 mg | ORAL_TABLET | Freq: Every day | ORAL | 2 refills | Status: DC

## 2023-11-01 MED ORDER — MOMETASONE FURO-FORMOTEROL FUM 200-5 MCG/ACT IN AERO: 2.0000 | INHALATION_SPRAY | Freq: Two times a day (BID) | RESPIRATORY_TRACT | 5 refills | Status: DC

## 2023-11-01 NOTE — Assessment & Plan Note (Addendum)
 Potassium levels normalized on Lokelma . Pt states Entresto  and Lasix  discontinued by cardiology. I do not see mention of stopping entresto  in her chart.  Advised her to reach out to her cardiologist to confirm if she should resume taking this.   - Continue Lokelma  daily - Follow up with cardiology regarding Entresto  resumption - Monitor weight and symptoms of fluid overload

## 2023-11-01 NOTE — Patient Instructions (Signed)
 It was nice to see you today,  We addressed the following topics today: -I would like you to call your cardiologist office and asked them if the want you to restart your Entresto  now that you are potassium level is better.  I do not see any notes from them that said to discontinue it. - I have sent in the refills of your medications. - Your A1c was 7.0.  This is better.  Will not make any changes to your medications at this time.  Have a great day,  Rolan Slain, MD

## 2023-11-01 NOTE — Progress Notes (Signed)
 Established Patient Office Visit  Subjective   Patient ID: Kaitlin Cantrell, female    DOB: 06/23/1955  Age: 68 y.o. MRN: [id number]  Chief Complaint  Patient presents with   Medical Management of Chronic Issues    HPI  Subjective - Oxygen  saturation monitoring at home: ranges 90-95, varies with activity, today 96 - Currently using inhaler provided from hospital discharge, previous inhaler no longer available - Prefers current inhaler (dulera ) - Denies productive cough, occasional minimal sputum - smoking cessation maintained - Cardiology follow-up completed: potassium was elevated, now improved - Weight gain noted: approximately 11 pounds over 3 months (251 to 263 pounds) - Concerned about possible fluid retention vs weight gain - No shortness of breath when lying flat, no worsening dyspnea, no worsening hypoxia. No significant LE edema.  - Back pain limiting activity, scheduled for injection 05/09/2023 - Diabetes management: reports A1C improved to 7.0 - No numbness or tingling in feet  Medications: Lokelma  daily for elevated potassium, Jardiance  and glimepiride  for diabetes, insulin  25 units morning and 15 units evening, Dulera  inhaler, Zanaflex , iron  supplement, vitamin D   PMH, PSH, FH, Social Hx: COPD, diabetes, heart failure, pneumonia (recent hospitalization), smoking history 1.5 packs daily for 50 years (quit), back pain  ROS: denies productive cough, denies shortness of breath when supine, denies foot numbness or tingling   The 10-year ASCVD risk score (Arnett DK, et al., 2019) is: 31.9%  Health Maintenance Due  Topic Date Due   COVID-19 Vaccine (1) Never done   OPHTHALMOLOGY EXAM  03/30/2017   Medicare Annual Wellness (AWV)  12/28/2017      Objective:     BP 136/81   Pulse (!) 57   Ht 5' 6 (1.676 m)   Wt 263 lb 12.8 oz (119.7 kg)   SpO2 96%   BMI 42.58 kg/m    Physical Exam Gen: alert, oriented Pulm: no resp distress. On room air.  Psych:  pleasant affect.     Results for orders placed or performed in visit on 11/01/23  POCT glycosylated hemoglobin (Hb A1C)  Result Value Ref Range   Hemoglobin A1C     HbA1c POC (<> result, manual entry) 7.0 4.0 - 5.6 %   HbA1c, POC (prediabetic range)     HbA1c, POC (controlled diabetic range)          Assessment & Plan:   Type 2 diabetes mellitus with microalbuminuria, with long-term current use of insulin  (HCC) Assessment & Plan:  A1C improved to 7.0. Current regimen well-tolerated. - Continue Jardiance  and glimepiride  - Continue insulin  25 units morning, 15 units evening - Diabetic foot exam completed today, sensation intact - Urine microalbumin ordered  Orders: -     tiZANidine  HCl; TAKE 1 TABLET BY MOUTH EVERY 8 HOURS AS NEEDED FOR MUSCLE SPASM  Dispense: 90 tablet; Refill: 0 -     POCT glycosylated hemoglobin (Hb A1C) -     Microalbumin / creatinine urine ratio -     Comprehensive metabolic panel with GFR; Future -     Hemoglobin A1c; Future   Hypertension associated with type 2 diabetes mellitus (HCC) Assessment & Plan: Continue current medications: metoprolol , amlodipine .  Advised pt to reach out to cardiology regarding entresto .   Orders: -     tiZANidine  HCl; TAKE 1 TABLET BY MOUTH EVERY 8 HOURS AS NEEDED FOR MUSCLE SPASM  Dispense: 90 tablet; Refill: 0 -     POCT glycosylated hemoglobin (Hb A1C)  Hyperlipidemia associated with type 2 diabetes mellitus (  HCC) -     tiZANidine  HCl; TAKE 1 TABLET BY MOUTH EVERY 8 HOURS AS NEEDED FOR MUSCLE SPASM  Dispense: 90 tablet; Refill: 0 -     POCT glycosylated hemoglobin (Hb A1C) -     Lipid panel; Future  Vitamin D  deficiency -     Vitamin D  (Ergocalciferol ); Take 1 capsule (50,000 Units total) by mouth once a week.  Dispense: 12 capsule; Refill: 0 -     VITAMIN D  25 Hydroxy (Vit-D Deficiency, Fractures); Future  Combined systolic and diastolic congestive heart failure (HCC) Assessment & Plan: Potassium levels  normalized on Lokelma . Pt states Entresto  and Lasix  discontinued by cardiology. I do not see mention of stopping entresto  in her chart.  Advised her to reach out to her cardiologist to confirm if she should resume taking this.   - Continue Lokelma  daily - Follow up with cardiology regarding Entresto  resumption - Monitor weight and symptoms of fluid overload   Pulmonary emphysema, unspecified emphysema type (HCC) Assessment & Plan: Currently stable on home oxygen . Oxygen  saturations adequate at 96. Using Dulera  inhaler with good symptom control. Scheduled pulmonology consultation 06/01/2023 and CT scan 05/17/2023. - Continue current Dulera  inhaler    Other orders -     Mometasone  Furo-Formoterol  Fum; Inhale 2 puffs into the lungs 2 (two) times daily.  Dispense: 13 g; Refill: 5 -     Ferrous Sulfate ; Take 1 tablet (325 mg total) by mouth daily.  Dispense: 30 tablet; Refill: 2     Return in about 3 months (around 02/01/2024) for DM.    Kaitlin MARLA Slain, MD

## 2023-11-01 NOTE — Assessment & Plan Note (Signed)
 Currently stable on home oxygen . Oxygen  saturations adequate at 96. Using Dulera  inhaler with good symptom control. Scheduled pulmonology consultation 06/01/2023 and CT scan 05/17/2023. - Continue current Dulera  inhaler

## 2023-11-01 NOTE — Assessment & Plan Note (Signed)
 Continue current medications: metoprolol , amlodipine .  Advised pt to reach out to cardiology regarding entresto .

## 2023-11-01 NOTE — Assessment & Plan Note (Deleted)
 A1C improved to 7.0. Current regimen well-tolerated. - Continue Jardiance  and glimepiride  - Continue insulin  25 units morning, 15 units evening - Diabetic foot exam completed today, sensation intact - Urine microalbumin ordered

## 2023-11-01 NOTE — Assessment & Plan Note (Signed)
 A1C improved to 7.0. Current regimen well-tolerated. - Continue Jardiance  and glimepiride  - Continue insulin  25 units morning, 15 units evening - Diabetic foot exam completed today, sensation intact - Urine microalbumin ordered

## 2023-11-02 LAB — MICROALBUMIN / CREATININE URINE RATIO
Creatinine, Urine: 166.7 mg/dL
Microalb/Creat Ratio: 12 mg/g{creat} (ref 0–29)
Microalbumin, Urine: 20.7 ug/mL

## 2023-11-10 ENCOUNTER — Telehealth: Payer: Self-pay

## 2023-11-10 ENCOUNTER — Other Ambulatory Visit: Payer: Self-pay | Admitting: Family Medicine

## 2023-11-10 MED ORDER — BREZTRI AEROSPHERE 160-9-4.8 MCG/ACT IN AERO
2.0000 | INHALATION_SPRAY | Freq: Two times a day (BID) | RESPIRATORY_TRACT | 11 refills | Status: AC
Start: 2023-11-10 — End: ?

## 2023-11-10 NOTE — Telephone Encounter (Signed)
-----   Message from [name] sent at 11/10/2023  6:37 AM EDT ----- Please call the patient and let her know the dulera  was denied.  I am sending in a different inhaler that has formoterol  and a steroid just like dulera  but also has another medication as well, so it should be as good or better than the dulera  if insurance will cover it. ----- Message ----- From: Jason Falling, RMA Sent: 11/08/2023   4:39 PM EDT To: [name], MD   ----- Message ----- From: Landy Rosina BIRCH, CMA Sent: 11/08/2023   4:30 PM EDT To: Falling Jason, RMA

## 2023-11-10 NOTE — Telephone Encounter (Signed)
 Called pt LVM to contact the office and sending a mychart message

## 2023-11-10 NOTE — Telephone Encounter (Signed)
 Patient returned call. Advised of Dr. Adine message and rx sent to [facility] - [address]. Patient verbalized understanding.

## 2023-11-14 ENCOUNTER — Ambulatory Visit
Admission: RE | Admit: 2023-11-14 | Discharge: 2023-11-14 | Disposition: A | Discharge status: Home / Self Care | Source: Ambulatory Visit | Attending: Family Medicine | Admitting: Family Medicine

## 2023-11-14 DIAGNOSIS — R918 Other nonspecific abnormal finding of lung field: Secondary | ICD-10-CM

## 2023-11-15 ENCOUNTER — Other Ambulatory Visit (HOSPITAL_COMMUNITY): Payer: Self-pay

## 2023-11-15 ENCOUNTER — Telehealth (HOSPITAL_COMMUNITY): Payer: Self-pay

## 2023-11-15 NOTE — Telephone Encounter (Signed)
 Advanced Heart Failure Patient Advocate Encounter  Patients previous [employer] grant has expired. Review of chart to assess for renewal shows that Entresto  has $0 copay for 90 day supply. Grant renewal not needed at this time.  Rachel DEL, CPhT Rx Patient Advocate Phone: [phone]

## 2023-11-24 ENCOUNTER — Ambulatory Visit: Payer: Self-pay | Admitting: Family Medicine

## 2023-11-24 ENCOUNTER — Telehealth: Payer: Self-pay

## 2023-11-24 NOTE — Telephone Encounter (Signed)
 Provider had been made aware that the CT results are back

## 2023-11-24 NOTE — Telephone Encounter (Signed)
 Called and LVM

## 2023-11-24 NOTE — Telephone Encounter (Signed)
 Copied from CRM #[id number]. Topic: Clinical - Lab/Test Results >> Nov 24, 2023  8:46 AM Burnard DEL wrote: Reason for CRM: [facility] called in and wanted to make sure provider received and was able to see results from chest CT that patient done

## 2023-11-29 ENCOUNTER — Telehealth: Payer: Self-pay | Admitting: Emergency Medicine

## 2023-11-29 ENCOUNTER — Ambulatory Visit: Admitting: Emergency Medicine

## 2023-11-29 ENCOUNTER — Encounter: Payer: Self-pay | Admitting: Emergency Medicine

## 2023-11-29 VITALS — BP 109/67 | HR 54 | Temp 98.2°F | Ht 66.0 in | Wt 265.4 lb

## 2023-11-29 DIAGNOSIS — R918 Other nonspecific abnormal finding of lung field: Secondary | ICD-10-CM | POA: Diagnosis not present

## 2023-11-29 DIAGNOSIS — J9611 Chronic respiratory failure with hypoxia: Secondary | ICD-10-CM

## 2023-11-29 DIAGNOSIS — J449 Chronic obstructive pulmonary disease, unspecified: Secondary | ICD-10-CM | POA: Diagnosis not present

## 2023-11-29 DIAGNOSIS — J439 Emphysema, unspecified: Secondary | ICD-10-CM

## 2023-11-29 NOTE — Progress Notes (Addendum)
 Subjec heart failure tive:    Patient ID: Kaitlin Cantrell, female    DOB: Sep 26, 1955, 68 y.o.   MRN: [id number]  HPI Kaitlin Cantrell is a 68 year old female with COPD and CHF who presents with pulmonary nodules and shortness of breath. She is accompanied by her husband, Lamar. She was referred by Dr. Flynn and Dr. Rolan for evaluation of pulmonary nodules and shortness of breath.  She has a history of COPD, CHF, and secondary pulmonary hypertension. She participates in a lung cancer screening program and had a recent CT scan on November 14, 2023. The scan showed no mediastinal or hilar adenopathy and revealed central lobular and paraceptal emphysema. Previously identified nodules have decreased in size or resolved, except for a new 4 mm left upper lobe nodule and a 7 mm left lower lobe nodule that has progressed. The scan was categorized as a RADS4A study.  She experiences shortness of breath, particularly with exertion, and notes that her chronic back pain, present for 28 years, limits her activity. Her breathing is manageable when sitting still but becomes difficult with physical activity. Her back pain can exacerbate her breathing difficulties.  She has a history of COPD exacerbations and community-acquired pneumonia, leading to hospitalizations. She uses Breztri  inhaler, two puffs twice a day, which she started three weeks ago and finds effective. She also uses Atrovent  as a rescue inhaler. She previously used a Pulmicort  nebulizer but discontinued it due to inconvenience.  She quit smoking after 50 years, with a history of 75 pack years. She reports occasional wheezing, particularly when lying down, occurring in the upper throat area. No frequent coughing or sputum production.  She has a history of a submassive pulmonary embolism in August 2017 and is currently on Xarelto . She monitors her oxygen  levels, which can drop below 90 with exertion but improve with rest. She has oxygen  at home from  a previous hospitalization but does not use it regularly.   RADIOLOGY Chest CT: No mediastinal adenopathy, no hilar adenopathy, centrilobular and paraseptal emphysema, nodules decreased or resolved, new 4 mm left upper lobe nodule, progressive 7 mm left lower lobe nodule, REDS4A study (11/14/2023)    Ambulatory Pulse Oximetry  Row Name 11/29/23 0948      Resting  Resting Heart Rate 52      Resting Sp02 86      Lap 1 (250 feet)  HR 52      02 Sat 86      Lap 2 (250 feet)  HR 55      02 Sat 100      Lap 3 (250 feet)  Tech Comments: pt completed 2 laps droped to 86 and was placed on 2L of O2 and was leveled at 100 for the whole 2nd lap         Review of Systems As per HPI  Past Medical History:  Diagnosis Date   Allergy    seasonal   Arthritis    Atrial flutter with rapid ventricular response (HCC) 12/06/2019   Diabetes mellitus    GERD (gastroesophageal reflux disease)    History of pneumonia    15 years ago   Hyperlipidemia    Hypertension    PE (pulmonary embolism) 12/2015    Family History  Problem Relation Age of Onset   Colon polyps Father    Diabetes Father        mother   Heart disease Father    Heart murmur Mother  Diabetes Mother    Hypertension Mother    Diabetes Brother    Cancer Maternal Grandmother    Diabetes Paternal Grandmother    Heart disease Paternal Grandmother    COPD Paternal Grandfather    Heart disease Paternal Grandfather    Hypertension Brother    Hypertension Brother      Social History   Socioeconomic History   Marital status: Married    Spouse name: Not on file   Number of children: 0   Years of education: Not on file   Highest education level: Associate degree: academic program  Occupational History   Occupation: retired  Tobacco Use   Smoking status: Every Day    Current packs/day: 1.00    Average packs/day: 1 pack/day for 45.0 years (45.0 ttl pk-yrs)    Types: Cigarettes    Passive exposure: Current    Smokeless tobacco: Never  Vaping Use   Vaping status: Never Used  Substance and Sexual Activity   Alcohol use: Yes    Comment: rarely   Drug use: No   Sexual activity: Not Currently  Other Topics Concern   Not on file  Social History Narrative   Exercise walking daily (varies)   Social Drivers of Health   Financial Resource Strain: Low Risk  (01/09/2023)   Overall Financial Resource Strain (CARDIA)    Difficulty of Paying Living Expenses: Not very hard  Food Insecurity: No Food Insecurity (07/25/2023)   Hunger Vital Sign    Worried About Running Out of Food in the Last Year: Never true    Ran Out of Food in the Last Year: Never true  Transportation Needs: No Transportation Needs (07/25/2023)   PRAPARE - Administrator, Civil Service (Medical): No    Lack of Transportation (Non-Medical): No  Physical Activity: Unknown (01/09/2023)   Exercise Vital Sign    Days of Exercise per Week: 0 days    Minutes of Exercise per Session: Not on file  Stress: No Stress Concern Present (01/09/2023)   [employer] of Occupational Health - Occupational Stress Questionnaire    Feeling of Stress : Only a little  Social Connections: Moderately Isolated (07/18/2023)   Social Connection and Isolation Panel    Frequency of Communication with Friends and Family: More than three times a week    Frequency of Social Gatherings with Friends and Family: More than three times a week    Attends Religious Services: Never    Database administrator or Organizations: No    Attends Banker Meetings: Never    Marital Status: Married  Catering manager Violence: Not At Risk (07/25/2023)   Humiliation, Afraid, Rape, and Kick questionnaire    Fear of Current or Ex-Partner: No    Emotionally Abused: No    Physically Abused: No    Sexually Abused: No   - Tobacco: Former smoker, 2.0 ppd x 38 years (75 pack-years). 75 pack-year history - Employment: Film/video editor at a [employer] - Partner  Status: Married  Allergies  Allergen Reactions   Albuterol  Anaphylaxis    Throat/lips swelling   Morphine And Codeine Other (See Comments)    Very aggressive and doesn't help pain    Current Outpatient Medications on File Prior to Visit  Medication Sig Dispense Refill   acetaminophen  (TYLENOL ) 500 MG tablet Take 1,000 mg by mouth every 6 (six) hours as needed for moderate pain (pain score 4-6).     amLODipine  (NORVASC ) 2.5 MG tablet Take 1 tablet (  2.5 mg total) by mouth daily. 90 tablet 3   atorvastatin  (LIPITOR ) 80 MG tablet TAKE 1 TABLET BY MOUTH ONCE DAILY AT  6PM 90 tablet 3   budesonide  (PULMICORT ) 0.25 MG/2ML nebulizer solution Take 4 mLs (0.5 mg total) by nebulization daily. 120 mL 3   budesonide -glycopyrrolate -formoterol  (BREZTRI  AEROSPHERE) 160-9-4.8 MCG/ACT AERO inhaler Inhale 2 puffs into the lungs 2 (two) times daily. 10.7 g 11   Elastic Bandages & Supports (MEDICAL COMPRESSION STOCKINGS) MISC Apply to feet in morning.  Remove at night before sleeping. 2 each 1   empagliflozin  (JARDIANCE ) 25 MG TABS tablet Take 1 tablet (25 mg total) by mouth daily before breakfast. 30 tablet 5   ferrous sulfate  (SV IRON ) 325 (65 FE) MG tablet Take 1 tablet (325 mg total) by mouth daily. 30 tablet 2   glimepiride  (AMARYL ) 4 MG tablet Take 1 tablet by mouth twice daily (Patient taking differently: Take 4 mg by mouth daily.) 180 tablet 1   ipratropium (ATROVENT  HFA) 17 MCG/ACT inhaler Inhale 2 puffs into the lungs every 6 (six) hours as needed for wheezing. 1 each 11   ipratropium (ATROVENT ) 0.02 % nebulizer solution Take 2.5 mLs (0.5 mg total) by nebulization in the morning and at bedtime. 75 mL 5   Krill Oil CAPS Take 1 capsule by mouth daily.      LANTUS  SOLOSTAR 100 UNIT/ML Solostar Pen INJECT 20 Units in the morning and 15 units in evening (Patient taking differently: INJECT 25 Units in the morning and 20 units in evening) 15 mL 3   metoprolol  (TOPROL -XL) 200 MG 24 hr tablet Take 0.5 tablets  (100 mg total) by mouth daily. 90 tablet 3   omeprazole  (PRILOSEC) 20 MG capsule Take 20 mg by mouth daily.     pregabalin  (LYRICA ) 75 MG capsule Take 1 capsule (75 mg total) by mouth 3 (three) times daily. 90 capsule 3   rivaroxaban  (XARELTO ) 20 MG TABS tablet Take 1 tablet (20 mg total) by mouth daily. 90 tablet 3   sodium zirconium cyclosilicate  (LOKELMA ) 10 g PACK packet Take 10 g by mouth daily. 90 packet 1   tiZANidine  (ZANAFLEX ) 4 MG tablet TAKE 1 TABLET BY MOUTH EVERY 8 HOURS AS NEEDED FOR MUSCLE SPASM 90 tablet 0   Vitamin D , Ergocalciferol , (DRISDOL ) 1.25 MG (50000 UNIT) CAPS capsule Take 1 capsule (50,000 Units total) by mouth once a week. 12 capsule 0   No current facility-administered medications on file prior to visit.         Objective:   Physical Exam Vitals:   11/29/23 0824  BP: 109/67  Pulse: (!) 54  Temp: 98.2 F (36.8 C)  TempSrc: Temporal  SpO2: 94%  Weight: 265 lb 6.4 oz (120.4 kg)  Height: 5' 6 (1.676 m)   Gen: Pleasant, overweight woman, in no distress,  normal affect  ENT: No lesions,  mouth clear,  oropharynx clear, no postnasal drip  Neck: No JVD, no stridor  Lungs: No use of accessory muscles, no crackles or wheezing on normal respiration, no wheeze on forced expiration  Cardiovascular: RRR, heart sounds normal, no murmur or gallops, no peripheral edema  Musculoskeletal: No deformities, no cyanosis or clubbing  Neuro: alert, awake, non focal  Skin: Warm, no lesions or rash       Assessment & Plan:  COPD (chronic obstructive pulmonary disease) (HCC) Chronic obstructive pulmonary disease (COPD) COPD with dyspnea and occasional wheezing. Smoking is a contributing factor. Breztri  is effective for maintenance, and Atrovent  is used  as a rescue inhaler. Oxygen  saturation drops below 90% during exertion, requiring monitoring. - Continue Breztri  as maintenance therapy. - Use Atrovent  as a rescue inhaler. - Discontinue nebulized budesonide . -  Order pulmonary function tests to assess COPD severity. - Conduct a walking test to monitor oxygen  saturation levels.  Chronic respiratory failure with hypoxia [facility]) She qualified for exertional oxygen  on ambulation today.  We will ensure that she has portable O2.  She will need to be qualified for a POC at some point  Pulmonary nodules Pulmonary nodules of left upper and lower lobes under surveillance Pulmonary nodules in the left upper and lower lobes are under surveillance. Recent CT showed a new 4 mm nodule in the left upper lobe and a progressive 7 mm nodule in the left lower lobe. Follow-up in three months was recommended due to slow growth rate and nodule size. - Order follow-up CT scan in October to monitor pulmonary nodules. - Review CT scan results to assess nodule behavior and determine if further action is needed.    Lamar Chris, MD, PhD 11/29/2023, 12:09 PM [facility] [phone] or if no answer before 7:00PM call [phone] For any issues after 7:00PM please call eLink [phone]

## 2023-11-29 NOTE — Telephone Encounter (Signed)
 Per Avelina at [employer] we have provide please copy and paste sats into their progress note or CO-sign the nurse sats.

## 2023-11-29 NOTE — Assessment & Plan Note (Signed)
 Chronic obstructive pulmonary disease (COPD) COPD with dyspnea and occasional wheezing. Smoking is a contributing factor. Breztri  is effective for maintenance, and Atrovent  is used as a rescue inhaler. Oxygen  saturation drops below 90% during exertion, requiring monitoring. - Continue Breztri  as maintenance therapy. - Use Atrovent  as a rescue inhaler. - Discontinue nebulized budesonide . - Order pulmonary function tests to assess COPD severity. - Conduct a walking test to monitor oxygen  saturation levels.

## 2023-11-29 NOTE — Patient Instructions (Signed)
 VISIT SUMMARY:  Today, we discussed your pulmonary nodules and shortness of breath. We reviewed your recent CT scan, which showed a new nodule in your left upper lobe and a growing nodule in your left lower lobe. We also talked about your COPD management and the effectiveness of your current inhalers.  YOUR PLAN:  -PULMONARY NODULES: Pulmonary nodules are small growths in the lungs. Your recent CT scan showed a new 4 mm nodule in your left upper lobe and a 7 mm nodule in your left lower lobe that has grown. We will monitor these nodules with a follow-up CT scan in October to see if they change in size or appearance.  -CHRONIC OBSTRUCTIVE PULMONARY DISEASE (COPD): COPD is a chronic lung disease that makes it hard to breathe. You will continue using Breztri  as your maintenance inhaler and Atrovent  as your rescue inhaler. We will stop the nebulized budesonide . We will also order pulmonary function tests to check the severity of your COPD and conduct a walking test to monitor your oxygen  levels during activity.  INSTRUCTIONS:  Please schedule a follow-up CT scan in October to monitor your pulmonary nodules. Additionally, we will arrange for pulmonary function tests and a walking test to assess your COPD. Continue using your Breztri  and Atrovent  inhalers as directed.

## 2023-11-29 NOTE — Telephone Encounter (Signed)
 I have pasted it into my office visit

## 2023-11-29 NOTE — Assessment & Plan Note (Signed)
 Pulmonary nodules of left upper and lower lobes under surveillance Pulmonary nodules in the left upper and lower lobes are under surveillance. Recent CT showed a new 4 mm nodule in the left upper lobe and a progressive 7 mm nodule in the left lower lobe. Follow-up in three months was recommended due to slow growth rate and nodule size. - Order follow-up CT scan in October to monitor pulmonary nodules. - Review CT scan results to assess nodule behavior and determine if further action is needed.

## 2023-11-29 NOTE — Assessment & Plan Note (Signed)
 She qualified for exertional oxygen  on ambulation today.  We will ensure that she has portable O2.  She will need to be qualified for a POC at some point

## 2023-11-30 NOTE — Telephone Encounter (Signed)
 Per Avelina at [employer]- I am not seeing were sats were copied and paste, I see provider notes Oxygen  saturation drops below 90% during exertion, requiring monitoring. But we would need all three sats noted please.

## 2023-11-30 NOTE — Progress Notes (Signed)
 Ambulatory Pulse Oximetry   Row Name 11/29/23 0948          Resting  Resting Heart Rate 52          Resting Sp02 86          Lap 1 (250 feet)  HR 52          02 Sat 86          Lap 2 (250 feet)  HR 55          02 Sat 100          Lap 3 (250 feet)  Tech Comments: pt completed 2 laps droped to 86 and was placed on 2L of O2 and was leveled at 100 for the whole 2nd lap

## 2023-11-30 NOTE — Telephone Encounter (Signed)
 Good to go. NFN

## 2023-11-30 NOTE — Telephone Encounter (Signed)
 Thanks! Sent to DME company

## 2023-11-30 NOTE — Telephone Encounter (Signed)
 I have informed DME will close out if response is good to go.

## 2023-11-30 NOTE — Telephone Encounter (Signed)
 Sats were copied and pasted in his ov note from yesterday. I have added them to the office visit note as well.  Can you see if that works? Thank you!   Ambulatory Pulse Oximetry   Row Name 11/29/23 0948          Resting  Resting Heart Rate 52          Resting Sp02 86          Lap 1 (250 feet)  HR 52          02 Sat 86          Lap 2 (250 feet)  HR 55          02 Sat 100          Lap 3 (250 feet)  Tech Comments: pt completed 2 laps droped to 86 and was placed on 2L of O2 and was leveled at 100 for the whole 2nd lap

## 2023-12-05 ENCOUNTER — Other Ambulatory Visit

## 2023-12-05 DIAGNOSIS — Z006 Encounter for examination for normal comparison and control in clinical research program: Secondary | ICD-10-CM

## 2023-12-11 ENCOUNTER — Other Ambulatory Visit: Payer: Self-pay | Admitting: Family Medicine

## 2023-12-11 DIAGNOSIS — I5041 Acute combined systolic (congestive) and diastolic (congestive) heart failure: Secondary | ICD-10-CM

## 2023-12-11 MED ORDER — METOPROLOL SUCCINATE ER 100 MG PO TB24
100.0000 mg | ORAL_TABLET | Freq: Every day | ORAL | 3 refills | Status: AC
Start: 2023-12-11 — End: ?

## 2023-12-19 LAB — GENECONNECT MOLECULAR SCREEN: Genetic Analysis Overall Interpretation: NEGATIVE

## 2023-12-26 ENCOUNTER — Other Ambulatory Visit: Payer: Self-pay | Admitting: Family Medicine

## 2023-12-26 DIAGNOSIS — E1129 Type 2 diabetes mellitus with other diabetic kidney complication: Secondary | ICD-10-CM

## 2023-12-26 DIAGNOSIS — I152 Hypertension secondary to endocrine disorders: Secondary | ICD-10-CM

## 2023-12-26 DIAGNOSIS — E1169 Type 2 diabetes mellitus with other specified complication: Secondary | ICD-10-CM

## 2023-12-26 DIAGNOSIS — E119 Type 2 diabetes mellitus without complications: Secondary | ICD-10-CM

## 2024-01-07 ENCOUNTER — Other Ambulatory Visit: Payer: Self-pay | Admitting: Family Medicine

## 2024-01-08 NOTE — Progress Notes (Signed)
 Date:  01/08/2024   ID:  Kaitlin Cantrell, DOB 10-28-1955, MRN [id number]   Provider location:  [facility] Type of Visit: Established patient   PCP:  Chandra Toribio POUR, MD  Cardiologist:  Dr. Rolan   HPI: Kaitlin Cantrell is a 68 y.o. female who has a history of COPD, tobacco abuse, DM, HTN, arthritis, and chronic pain syndrome and presented to [facility] with SOB in 8/17.  She had had several months of increasing exertional dyspnea prior to this.  CT angio 12/29/15 showed submassive PE with concerns for right heart strain. Now on Xarelto .  Echo 12/30/15 showed LVEF 20-25%, Grade 3 DD, Mod MR, mod LAE, Mild RV dilation with mildly reduced systolic function, Moderate RAE, Moderate TR. Peak PA pressure 54 mm Hg.  Cardiac MRI (9/17) showed EF 15%, moderate LV dilation, mild RV dilation/moderately decreased RV systolic function, no LGE.  She was diuresed in the hospital and begun on Xarelto .   Smoked 1 ppd for many years.  She tried Chantix  but did not stop. No ETOH or drug use.  No history of cancer or recent surgery, no long car/plane trips prior to her PE.    In 9/17, spironolactone  was discontinued due to persistent hyperkalemia.   RHC/LHC in 12/17 showed no significant CAD, optimized filling pressures, low but not markedly low cardiac output.    Echo in 3/18 showed  EF improved to 55% with mild LVH.  Repeat echo 7/20, EF 55-60%.     She was admitted 8/21 for COVID PNA. Hospital course c/b atrial flutter w/ RVR. She did not require intubation. She was seen by general cardiology w/ recommendations to treat w/ rate control w/ metoprolol  + continuation of Xarelto . Recommend eventual DCCV once infection cleared.   She had successful TEE/DCCV to NSR in 9/21. TEE showed normal EF 55%.    Echo 2/24 was done because of new LBBB and showed EF 60-65%, normal RV, mild MR/TR  Patient was admitted in 3/25 with COPD exacerbation and PNA.  She quit smoking after this admission.   Today  she returns for HF follow up. Overall feeling better. Dizziness improved on lower dose of amlodipine . She is SOB with walking on flat ground short distances. Uses 2L oxygen  at night. Denies palpitations, abnormal bleeding, CP, edema, or PND/Orthopnea. Appetite ok. Does not weigh at home. Taking all medications. Remains quit from tobacco x 3 months.  ECG (personally reviewed): none ordered today.  Labs (10/23): LDL 80, HDL 33, K 5.2, creatinine 8.86 Labs (2/24): K 4.6, SCr 1.3 Labs (4/25): K 5, creatinine 1.78 Labs (5/25): K 4.9, creatinine 2.79  PMH: 1. PE: 12/2015 CT angio 12/29/15 submassive PE with concerns for right heart strain. Now on Xarelto .  2. Cardiomyopathy:  Found 8/17.  - CMRI 01/01/2016: Nonischemic cardiomyopathy.  EF 15%, moderate LV dilation, mild RV dilation/moderately decreased RV systolic function.  No LGE: no infiltrative or inflammatory process noted, no evidence for prior MI. - Echo 12/30/2015: EF 20-25%. Grade III DD, Mod TR, PASP 54, mildly decreased RV systolic function.  - Hyperkalemia with spironolactone .  - LHC/RHC (12/17): No CAD, mean RA 2, PA 39/13 mean 23, mean PCWP 7, CI 2.1.  - Echo (3/18): EF 55%, mild LVH.  - Echo (7/20): EF 55-60%, normal RV.  - TEE (9/21): EF 55%, RV normal, mild MR.  - Echo 2/24: 2/2 new LBBB: EF 60-65%, normal RV, mild MR/TR 3. ABIs 01/05/2016 normal   4. COPD: Active smoker.  5. Type II diabetes 6. HTN 7. Chronic pain syndrome 8. CKD stage 3: Suspect diabetic nephropathy.  9. Atrial Flutter: DCCV to NSR in 9/21.    Current Outpatient Medications  Medication Sig Dispense Refill   acetaminophen  (TYLENOL ) 500 MG tablet Take 1,000 mg by mouth every 6 (six) hours as needed for moderate pain (pain score 4-6).     amLODipine  (NORVASC ) 2.5 MG tablet Take 1 tablet (2.5 mg total) by mouth daily. 90 tablet 3   atorvastatin  (LIPITOR ) 80 MG tablet TAKE 1 TABLET BY MOUTH ONCE DAILY AT  6PM 90 tablet 3   budesonide  (PULMICORT ) 0.25 MG/2ML  nebulizer solution Take 4 mLs (0.5 mg total) by nebulization daily. 120 mL 3   budesonide -glycopyrrolate -formoterol  (BREZTRI  AEROSPHERE) 160-9-4.8 MCG/ACT AERO inhaler Inhale 2 puffs into the lungs 2 (two) times daily. 10.7 g 11   Elastic Bandages & Supports (MEDICAL COMPRESSION STOCKINGS) MISC Apply to feet in morning.  Remove at night before sleeping. 2 each 1   empagliflozin  (JARDIANCE ) 25 MG TABS tablet Take 1 tablet (25 mg total) by mouth daily before breakfast. 30 tablet 5   ferrous sulfate  (SV IRON ) 325 (65 FE) MG tablet Take 1 tablet (325 mg total) by mouth daily. 30 tablet 2   glimepiride  (AMARYL ) 4 MG tablet Take 1 tablet by mouth twice daily (Patient taking differently: Take 4 mg by mouth daily.) 180 tablet 1   ipratropium (ATROVENT  HFA) 17 MCG/ACT inhaler Inhale 2 puffs into the lungs every 6 (six) hours as needed for wheezing. 1 each 11   ipratropium (ATROVENT ) 0.02 % nebulizer solution Take 2.5 mLs (0.5 mg total) by nebulization in the morning and at bedtime. 75 mL 5   Krill Oil CAPS Take 1 capsule by mouth daily.      LANTUS  SOLOSTAR 100 UNIT/ML Solostar Pen INJECT 20 Units in the morning and 15 units in evening (Patient taking differently: INJECT 25 Units in the morning and 20 units in evening) 15 mL 3   metoprolol  (TOPROL -XL) 100 MG 24 hr tablet Take 1 tablet (100 mg total) by mouth daily. 90 tablet 3   omeprazole  (PRILOSEC) 20 MG capsule Take 20 mg by mouth daily.     pregabalin  (LYRICA ) 75 MG capsule TAKE 1 CAPSULE BY MOUTH THREE TIMES DAILY 90 capsule 2   rivaroxaban  (XARELTO ) 20 MG TABS tablet Take 1 tablet (20 mg total) by mouth daily. 90 tablet 3   sodium zirconium cyclosilicate  (LOKELMA ) 10 g PACK packet Take 10 g by mouth daily. 90 packet 1   tiZANidine  (ZANAFLEX ) 4 MG tablet TAKE 1 TABLET BY MOUTH EVERY 8 HOURS AS NEEDED FOR MUSCLE SPASM 90 tablet 0   Vitamin D , Ergocalciferol , (DRISDOL ) 1.25 MG (50000 UNIT) CAPS capsule Take 1 capsule (50,000 Units total) by mouth once a  week. 12 capsule 0   No current facility-administered medications for this visit.    Allergies:   Albuterol  and Morphine and codeine   Social History:  The patient  reports that she has been smoking cigarettes. She has a 45 pack-year smoking history. She has been exposed to tobacco smoke. She has never used smokeless tobacco. She reports current alcohol use. She reports that she does not use drugs.   Family History:  The patient's family history includes COPD in her paternal grandfather; Cancer in her maternal grandmother; Colon polyps in her father; Diabetes in her brother, father, mother, and paternal grandmother; Heart disease in her father, paternal grandfather, and paternal grandmother; Heart murmur in her mother;  Hypertension in her brother, brother, and mother.   ROS:  Please see the history of present illness.   All other systems are personally reviewed and negative.   Vitals:  There were no vitals taken for this visit.  Wt Readings from Last 3 Encounters:  11/29/23 120.4 kg (265 lb 6.4 oz)  11/01/23 119.7 kg (263 lb 12.8 oz)  10/04/23 117.1 kg (258 lb 3.2 oz)   Physical Exam: General:  NAD. No resp difficulty HEENT: Normal Neck: Supple. No JVD. Cor: Regular rate & rhythm. No rubs, gallops or murmurs. Lungs: Clear Abdomen: Soft, nontender, nondistended.  Extremities: No cyanosis, clubbing, rash, edema Neuro: Alert & oriented x 3, moves all 4 extremities w/o difficulty. Affect pleasant.   ASSESSMENT AND PLAN: 1. Chronic HF with recovered EF: Nonischemic cardiomyopathy.  ECHO 12/2015 EF 15-20% with diffuse hypokinesis.  Cardiac MRI did not show LGE. HIV negative.  12/17 RHC/LHC showed no angiographic CAD, optimized filling pressures, low but not markedly low cardiac output.  Echo 3/18 showed recovery of LV function with EF up to 55%. Echo repeated 7/20 and EF still 55-60%. TEE in 9/21 showed EF 55%. Echo in 2/24 done because of new LBBB showed EF 60-65%, normal RV, mild MR/TR.   Patient has chronic NYHA class III symptoms but is not volume overloaded on exam.  I suspect that COPD is playing a major role.  - Continue Toprol  XL 200 mg daily  - Continue Entresto  97/103 bid.   - Continue Jardiance .  - Off spironolactone  with persistent hyperkalemia.  BMET today. - Off Lasix  with elevated SCr.  2. H/O PE: Submassive on CT.  Etiology uncertain: no known cancer, long trip, recent surgery.  Possibly related to stasis in the setting of cardiomyopathy with severely decreased systolic function.  - Given cardiomyopathy and submassive PE with no certain trigger, she has been continued on long-term anticoagulation. Remains on Xarelto . Recent CBC reviewed and stable. 3. Atrial Flutter: DCCV in 9/21.  Regular on exam today. - Continue Xarelto  + metoprolol . 4. CKD: Stage III. Jardiance . - BMET today.  5. COPD: Emphysema on CT chest.  She stopped smoking in 3/25.  Her chest CT in 3/25 showed worrisome lung nodules.  - She has been referred to Pulmonary, has follow up arranged. 6. HTN: BP stable, orthostasis improved on lower dose of amlodipine . - Continue amlodipine  2.5 mg daily.  If she continues to have orthostatic symptoms, can stop altogether.   Follow up in 3-4 months with APP.  Signed, Harlene CHRISTELLA Gainer, FNP  01/08/2024  [facility] [address] [facility] [place] [address] [phone] (office) [phone] (fax)

## 2024-01-09 ENCOUNTER — Telehealth (HOSPITAL_COMMUNITY): Payer: Self-pay

## 2024-01-09 NOTE — Telephone Encounter (Signed)
 Called to confirm/remind patient of their appointment at the [facility] on 01/10/24.   Appointment:   [] Confirmed  [x] Left mess   [] No answer/No voice mail  [] VM Full/unable to leave message  [] Phone not in service  And to bring in all medications and/or complete list.

## 2024-01-10 ENCOUNTER — Ambulatory Visit (HOSPITAL_COMMUNITY): Payer: Self-pay | Admitting: Family Medicine

## 2024-01-10 ENCOUNTER — Ambulatory Visit (HOSPITAL_COMMUNITY)
Admission: RE | Admit: 2024-01-10 | Discharge: 2024-01-10 | Disposition: A | Discharge status: Home / Self Care | Source: Ambulatory Visit | Attending: Family Medicine | Admitting: Family Medicine

## 2024-01-10 ENCOUNTER — Encounter (HOSPITAL_COMMUNITY): Payer: Self-pay

## 2024-01-10 VITALS — BP 126/82 | HR 52 | Ht 66.0 in | Wt 267.8 lb

## 2024-01-10 DIAGNOSIS — J449 Chronic obstructive pulmonary disease, unspecified: Secondary | ICD-10-CM | POA: Insufficient documentation

## 2024-01-10 DIAGNOSIS — I130 Hypertensive heart and chronic kidney disease with heart failure and stage 1 through stage 4 chronic kidney disease, or unspecified chronic kidney disease: Secondary | ICD-10-CM | POA: Diagnosis not present

## 2024-01-10 DIAGNOSIS — Z86711 Personal history of pulmonary embolism: Secondary | ICD-10-CM | POA: Diagnosis not present

## 2024-01-10 DIAGNOSIS — I5022 Chronic systolic (congestive) heart failure: Secondary | ICD-10-CM | POA: Diagnosis present

## 2024-01-10 DIAGNOSIS — Z7901 Long term (current) use of anticoagulants: Secondary | ICD-10-CM | POA: Diagnosis not present

## 2024-01-10 DIAGNOSIS — E875 Hyperkalemia: Secondary | ICD-10-CM | POA: Insufficient documentation

## 2024-01-10 DIAGNOSIS — I5032 Chronic diastolic (congestive) heart failure: Secondary | ICD-10-CM

## 2024-01-10 DIAGNOSIS — Z87891 Personal history of nicotine dependence: Secondary | ICD-10-CM | POA: Diagnosis not present

## 2024-01-10 DIAGNOSIS — Z794 Long term (current) use of insulin: Secondary | ICD-10-CM | POA: Insufficient documentation

## 2024-01-10 DIAGNOSIS — Z7984 Long term (current) use of oral hypoglycemic drugs: Secondary | ICD-10-CM | POA: Insufficient documentation

## 2024-01-10 DIAGNOSIS — E1122 Type 2 diabetes mellitus with diabetic chronic kidney disease: Secondary | ICD-10-CM | POA: Insufficient documentation

## 2024-01-10 DIAGNOSIS — Z79899 Other long term (current) drug therapy: Secondary | ICD-10-CM | POA: Insufficient documentation

## 2024-01-10 DIAGNOSIS — G894 Chronic pain syndrome: Secondary | ICD-10-CM | POA: Insufficient documentation

## 2024-01-10 DIAGNOSIS — I4892 Unspecified atrial flutter: Secondary | ICD-10-CM | POA: Diagnosis not present

## 2024-01-10 DIAGNOSIS — M1990 Unspecified osteoarthritis, unspecified site: Secondary | ICD-10-CM | POA: Insufficient documentation

## 2024-01-10 DIAGNOSIS — N183 Chronic kidney disease, stage 3 unspecified: Secondary | ICD-10-CM | POA: Insufficient documentation

## 2024-01-10 DIAGNOSIS — I428 Other cardiomyopathies: Secondary | ICD-10-CM | POA: Insufficient documentation

## 2024-01-10 DIAGNOSIS — E785 Hyperlipidemia, unspecified: Secondary | ICD-10-CM

## 2024-01-10 DIAGNOSIS — I10 Essential (primary) hypertension: Secondary | ICD-10-CM

## 2024-01-10 DIAGNOSIS — I447 Left bundle-branch block, unspecified: Secondary | ICD-10-CM | POA: Diagnosis not present

## 2024-01-10 LAB — CBC
HCT: 41.4 % (ref 36.0–46.0)
Hemoglobin: 13.2 g/dL (ref 12.0–15.0)
MCH: 28.4 pg (ref 26.0–34.0)
MCHC: 31.9 g/dL (ref 30.0–36.0)
MCV: 89 fL (ref 80.0–100.0)
Platelets: 236 K/uL (ref 150–400)
RBC: 4.65 MIL/uL (ref 3.87–5.11)
RDW: 13.4 % (ref 11.5–15.5)
WBC: 8.9 K/uL (ref 4.0–10.5)
nRBC: 0 % (ref 0.0–0.2)

## 2024-01-10 LAB — BASIC METABOLIC PANEL WITH GFR
Anion gap: 9 (ref 5–15)
BUN: 31 mg/dL — ABNORMAL HIGH (ref 8–23)
CO2: 22 mmol/L (ref 22–32)
Calcium: 9.3 mg/dL (ref 8.9–10.3)
Chloride: 107 mmol/L (ref 98–111)
Creatinine, Ser: 2.05 mg/dL — ABNORMAL HIGH (ref 0.44–1.00)
GFR, Estimated: 26 mL/min — ABNORMAL LOW (ref >60)
Glucose, Bld: 290 mg/dL — ABNORMAL HIGH (ref 70–99)
Potassium: 5.2 mmol/L — ABNORMAL HIGH (ref 3.5–5.1)
Sodium: 138 mmol/L (ref 135–145)

## 2024-01-10 LAB — LIPID PANEL
Cholesterol: 205 mg/dL — ABNORMAL HIGH (ref 0–200)
HDL: 36 mg/dL — ABNORMAL LOW (ref >40)
LDL Cholesterol: 123 mg/dL — ABNORMAL HIGH (ref 0–99)
Total CHOL/HDL Ratio: 5.7 ratio
Triglycerides: 229 mg/dL — ABNORMAL HIGH (ref <150)
VLDL: 46 mg/dL — ABNORMAL HIGH (ref 0–40)

## 2024-01-10 NOTE — Patient Instructions (Signed)
 Stop Lokelma . Labs today - will call you if abnormal. We have ordered an echo for you. We will obtain prior authorization from your insurance if needed and call you to schedule. Return to see Dr. Rolan in 4 months.  PLEASE CALL [phone] IN DECEMBER TO SCHEDULE THIS APPOINTMENT.  Please call [phone] if any questions or concerns prior to your next appointment.

## 2024-01-12 MED ORDER — LOKELMA 10 G PO PACK: 10.0000 g | PACK | ORAL | Status: AC

## 2024-01-21 ENCOUNTER — Other Ambulatory Visit: Payer: Self-pay | Admitting: Family Medicine

## 2024-01-21 DIAGNOSIS — E1159 Type 2 diabetes mellitus with other circulatory complications: Secondary | ICD-10-CM

## 2024-01-21 DIAGNOSIS — R809 Proteinuria, unspecified: Secondary | ICD-10-CM

## 2024-01-21 DIAGNOSIS — E119 Type 2 diabetes mellitus without complications: Secondary | ICD-10-CM

## 2024-01-21 DIAGNOSIS — E1169 Type 2 diabetes mellitus with other specified complication: Secondary | ICD-10-CM

## 2024-01-22 ENCOUNTER — Ambulatory Visit (HOSPITAL_COMMUNITY)
Admission: RE | Admit: 2024-01-22 | Discharge: 2024-01-22 | Disposition: A | Discharge status: Home / Self Care | Source: Ambulatory Visit | Attending: Cardiology | Admitting: Cardiology

## 2024-01-22 DIAGNOSIS — I5032 Chronic diastolic (congestive) heart failure: Secondary | ICD-10-CM | POA: Insufficient documentation

## 2024-01-22 LAB — BASIC METABOLIC PANEL WITH GFR
Anion gap: 8 (ref 5–15)
BUN: 30 mg/dL — ABNORMAL HIGH (ref 8–23)
CO2: 22 mmol/L (ref 22–32)
Calcium: 8.5 mg/dL — ABNORMAL LOW (ref 8.9–10.3)
Chloride: 108 mmol/L (ref 98–111)
Creatinine, Ser: 1.85 mg/dL — ABNORMAL HIGH (ref 0.44–1.00)
GFR, Estimated: 30 mL/min — ABNORMAL LOW (ref >60)
Glucose, Bld: 235 mg/dL — ABNORMAL HIGH (ref 70–99)
Potassium: 4.4 mmol/L (ref 3.5–5.1)
Sodium: 138 mmol/L (ref 135–145)

## 2024-01-23 ENCOUNTER — Ambulatory Visit (HOSPITAL_COMMUNITY): Payer: Self-pay | Admitting: Family Medicine

## 2024-02-01 ENCOUNTER — Encounter: Payer: Self-pay | Admitting: Family Medicine

## 2024-02-01 ENCOUNTER — Ambulatory Visit: Admitting: Family Medicine

## 2024-02-01 VITALS — BP 117/69 | HR 69 | Ht 66.0 in | Wt 264.1 lb

## 2024-02-01 DIAGNOSIS — E1169 Type 2 diabetes mellitus with other specified complication: Secondary | ICD-10-CM | POA: Diagnosis not present

## 2024-02-01 DIAGNOSIS — E785 Hyperlipidemia, unspecified: Secondary | ICD-10-CM

## 2024-02-01 DIAGNOSIS — E119 Type 2 diabetes mellitus without complications: Secondary | ICD-10-CM

## 2024-02-01 DIAGNOSIS — R809 Proteinuria, unspecified: Secondary | ICD-10-CM

## 2024-02-01 DIAGNOSIS — I152 Hypertension secondary to endocrine disorders: Secondary | ICD-10-CM

## 2024-02-01 DIAGNOSIS — E1159 Type 2 diabetes mellitus with other circulatory complications: Secondary | ICD-10-CM

## 2024-02-01 DIAGNOSIS — R252 Cramp and spasm: Secondary | ICD-10-CM

## 2024-02-01 DIAGNOSIS — Z794 Long term (current) use of insulin: Secondary | ICD-10-CM

## 2024-02-01 DIAGNOSIS — E1129 Type 2 diabetes mellitus with other diabetic kidney complication: Secondary | ICD-10-CM | POA: Diagnosis not present

## 2024-02-01 DIAGNOSIS — Z23 Encounter for immunization: Secondary | ICD-10-CM

## 2024-02-01 LAB — POCT GLYCOSYLATED HEMOGLOBIN (HGB A1C): HbA1c POC (<> result, manual entry): 10.7 % (ref 4.0–5.6)

## 2024-02-01 MED ORDER — FREESTYLE LIBRE 3 PLUS SENSOR MISC: 5 refills | Status: AC

## 2024-02-01 MED ORDER — TIRZEPATIDE 2.5 MG/0.5ML ~~LOC~~ SOAJ: 2.5000 mg | SUBCUTANEOUS | 0 refills | Status: DC

## 2024-02-01 NOTE — Assessment & Plan Note (Signed)
-   Recent labs show elevated triglycerides and VLDL, and low HDL, despite being on atorvastatin . Had been off atorvastatin  for one week prior to the lab draw. - Cardiology has placed a referral to a lipid clinic, which is pending. They may initiate an injectable medication like Repatha. - Continue atorvastatin . - Await contact from lipid clinic pharmacist.

## 2024-02-01 NOTE — Patient Instructions (Signed)
 It was nice to see you today,  We addressed the following topics today: -I have sent in Mounjaro for you to take.  If your insurance does not cover Mounjaro I will send in Ozempic, but both of these are taken once a week like I showed you. - If you get any nausea that you feel like is too much and you need a medication for nausea let us  know. - If you go longer than 2 days without a bowel movement I would recommend taking MiraLAX  and increasing the MiraLAX  by 1 1 dose per day until you have a bowel movement.  Your goal should be a bowel movement every 1 to 2 days. - They have not yet processed your referral to the lipid clinic, but someone will call you about this in the next few weeks.  Have a great day,  Rolan Slain, MD

## 2024-02-01 NOTE — Assessment & Plan Note (Addendum)
-   Reports new onset cramping in hands, feet, and legs, worse after taking potassium lowering medication - Discussed possibility of po magnesium . - discussed taking a magnesium  supplement once daily to help with nocturnal muscle cramps would probably be low risk for causing hyperkalemia on its own, especially with current Lokelma  schedule but advised her to discuss it with her cardiologist first.

## 2024-02-01 NOTE — Progress Notes (Signed)
 Established Patient Office Visit  Subjective   Patient ID: Kaitlin Cantrell, female    DOB: [birth year]  Age: 68 y.o. MRN: [id number]  Chief Complaint  Patient presents with   Medical Management of Chronic Issues    HPI Subjective - Follow-up for multiple chronic conditions. Discussed medication adjustments and new symptoms. - Reports recent sacral steroid injections for pain. - Reports recent cramping in hands, feet, and legs, particularly at rest. Fingers and toes deviate, and calf and back muscles cramp with stretching.  Medications Current medications discussed: Lokelma  Monday, Wednesday, Friday schedule, atorvastatin , Jardiance , Lantus  40 units and 15 units, glimepiride  one in the morning, metoprolol , Xarelto , Lasix  as needed. Notes from cardiology consult indicate to stop Lokelma .  PMH, PSH, FH, Social Hx PMH: Dyslipidemia, CKD stage 3 (GFR ~30), type 2 diabetes mellitus, heart failure (per cardiology note), COPD, pulmonary hypertension, osteoporosis, history of thyroid  nodules (resolved on recent imaging). PSH: None mentioned. FH: No family history of thyroid  cancer. Social Hx: None mentioned.  ROS Constitutional: Denies fever, chills. Endocrine: Pertinent positives for uncontrolled diabetes. Denies personal or family history of thyroid  cancer. GU: No new complaints. MSK: Reports cramping in hands, feet, legs, and back.   The 10-year ASCVD risk score (Arnett DK, et al., 2019) is: 26.1%  Health Maintenance Due  Topic Date Due   COVID-19 Vaccine (1) Never done   Zoster Vaccines- Shingrix (1 of 2) 12/06/1976   OPHTHALMOLOGY EXAM  03/30/2017   Medicare Annual Wellness (AWV)  12/28/2017      Objective:     BP 117/69   Pulse 69   Ht 5' 6 (1.676 m)   Wt 264 lb 1.9 oz (119.8 kg)   SpO2 95%   BMI 42.63 kg/m    Physical Exam Gen: alert, oriented Pulm: no respiratory distress Psych: pleasant affect   Results for orders placed or performed in visit on  02/01/24  POCT glycosylated hemoglobin (Hb A1C)  Result Value Ref Range   Hemoglobin A1C     HbA1c POC (<> result, manual entry) 10.7 4.0 - 5.6 %   HbA1c, POC (prediabetic range)     HbA1c, POC (controlled diabetic range)          Assessment & Plan:   Type 2 diabetes mellitus with microalbuminuria, with long-term current use of insulin  (HCC) Assessment & Plan: - HgbA1c has increased to 10.7% from 7%. Current regimen includes Jardiance , Lantus , and glimepiride . Metformin  is contraindicated due to low GFR. Recent steroid injections may have contributed to hyperglycemia. - Start Mounjaro weekly injection. Counseled on use of injection pen. - Discussed potential side effects including nausea, abdominal bloating, and constipation. Advised that nausea is usually mild and transient. - Advised to monitor for constipation and use Miralax  if no bowel movement for 3 days. Counseled to take Miralax  at least one hour apart from other medications, especially Lokelma . - As blood sugars improve on Mounjaro, will need to adjust and likely decrease or discontinue insulin . - Prescribed Freestyle Libre 3 continuous glucose monitor to assist with monitoring.  Orders: -     POCT glycosylated hemoglobin (Hb A1C)  Type 2 diabetes mellitus without complication, without long-term current use of insulin  (HCC) -     POCT glycosylated hemoglobin (Hb A1C)  Hypertension associated with type 2 diabetes mellitus (HCC) -     POCT glycosylated hemoglobin (Hb A1C)  Hyperlipidemia associated with type 2 diabetes mellitus (HCC) Assessment & Plan: - Recent labs show elevated triglycerides and VLDL, and low HDL, despite  being on atorvastatin . Had been off atorvastatin  for one week prior to the lab draw. - Cardiology has placed a referral to a lipid clinic, which is pending. They may initiate an injectable medication like Repatha. - Continue atorvastatin . - Await contact from lipid clinic pharmacist.  Orders: -      POCT glycosylated hemoglobin (Hb A1C)  Immunization due -     Flu vaccine HIGH DOSE PF(Fluzone Trivalent)  Muscle cramp Assessment & Plan: - Reports new onset cramping in hands, feet, and legs, worse after taking potassium lowering medication - Discussed possibility of po magnesium . - discussed taking a magnesium  supplement once daily to help with nocturnal muscle cramps would probably be low risk for causing hyperkalemia on its own, especially with current Lokelma  schedule but advised her to discuss it with her cardiologist first.    Other orders -     Tirzepatide; Inject 2.5 mg into the skin once a week.  Dispense: 2 mL; Refill: 0 -     FreeStyle Libre 3 Plus Sensor; Change sensor every 15 days.  Dispense: 2 each; Refill: 5     Return in about 3 months (around 04/30/2024) for DM.    [name], MD

## 2024-02-01 NOTE — Assessment & Plan Note (Signed)
-   HgbA1c has increased to 10.7% from 7%. Current regimen includes Jardiance , Lantus , and glimepiride . Metformin  is contraindicated due to low GFR. Recent steroid injections may have contributed to hyperglycemia. - Start Mounjaro weekly injection. Counseled on use of injection pen. - Discussed potential side effects including nausea, abdominal bloating, and constipation. Advised that nausea is usually mild and transient. - Advised to monitor for constipation and use Miralax  if no bowel movement for 3 days. Counseled to take Miralax  at least one hour apart from other medications, especially Lokelma . - As blood sugars improve on Mounjaro, will need to adjust and likely decrease or discontinue insulin . - Prescribed Freestyle Libre 3 continuous glucose monitor to assist with monitoring.

## 2024-02-05 ENCOUNTER — Other Ambulatory Visit: Payer: Self-pay | Admitting: Family Medicine

## 2024-02-06 ENCOUNTER — Telehealth: Payer: Self-pay | Admitting: *Deleted

## 2024-02-06 NOTE — Telephone Encounter (Signed)
 Opened in error

## 2024-02-08 ENCOUNTER — Other Ambulatory Visit: Payer: Self-pay | Admitting: Family Medicine

## 2024-02-08 DIAGNOSIS — E559 Vitamin D deficiency, unspecified: Secondary | ICD-10-CM

## 2024-02-12 ENCOUNTER — Inpatient Hospital Stay: Admission: RE | Admit: 2024-02-12 | Source: Ambulatory Visit

## 2024-02-13 ENCOUNTER — Ambulatory Visit
Admission: RE | Admit: 2024-02-13 | Discharge: 2024-02-13 | Disposition: A | Discharge status: Home / Self Care | Source: Ambulatory Visit | Attending: Emergency Medicine | Admitting: Emergency Medicine

## 2024-02-13 DIAGNOSIS — R918 Other nonspecific abnormal finding of lung field: Secondary | ICD-10-CM

## 2024-02-16 ENCOUNTER — Encounter

## 2024-02-16 ENCOUNTER — Encounter: Payer: Self-pay | Admitting: Emergency Medicine

## 2024-02-16 ENCOUNTER — Ambulatory Visit: Admitting: Emergency Medicine

## 2024-03-01 ENCOUNTER — Other Ambulatory Visit: Payer: Self-pay | Admitting: Family Medicine

## 2024-03-01 DIAGNOSIS — E1129 Type 2 diabetes mellitus with other diabetic kidney complication: Secondary | ICD-10-CM

## 2024-03-01 DIAGNOSIS — E1159 Type 2 diabetes mellitus with other circulatory complications: Secondary | ICD-10-CM

## 2024-03-01 DIAGNOSIS — E1169 Type 2 diabetes mellitus with other specified complication: Secondary | ICD-10-CM

## 2024-03-01 DIAGNOSIS — E119 Type 2 diabetes mellitus without complications: Secondary | ICD-10-CM

## 2024-03-05 ENCOUNTER — Other Ambulatory Visit: Payer: Self-pay | Admitting: Family Medicine

## 2024-03-06 ENCOUNTER — Other Ambulatory Visit: Payer: Self-pay

## 2024-03-06 ENCOUNTER — Ambulatory Visit: Payer: Self-pay | Admitting: Emergency Medicine

## 2024-03-21 NOTE — Progress Notes (Signed)
 Patient ID: Kaitlin Cantrell                 DOB: 08-27-1955                    MRN: [id number]      HPI: Kaitlin Cantrell is a 68 y.o. female patient referred to lipid clinic by Harlene Gainer, FNP. PMH is significant for COPD, tobacco use, T2DM, HTN, arthritis, chronic pain syndrome, and HLD, and PE.  Patient is an established heart failure clinic patient. Patient was last seen by Dr. Rolan in office on 01/10/24. LDL on 01/10/24 was 123. Patient was prescribed atorvastatin  80 mg once daily, but reported at her last PCP appointment that she had been off of atorvastatin  80 mg for a week prior to having her labs done. Patients was referred to PharmD to address lipid-lowering therapy.  Patient appeared to clinic today and is doing well. Patient had been off of the atorvastatin  for a few weeks prior to her last lipid panel. Patient stated that this was because she was out of medicine and did not go by the pharmacy to pick it up. She has since received a refill and been taking consistently. Patient stated she is currently on atorvastatin  80 mg once daily and is tolerating it well with no reported side effects. Reviewed options for lowering LDL cholesterol, including PCSK-9 inhibitors. Discussed mechanisms of action, dosing, side effects and potential decreases in LDL cholesterol.  Also reviewed cost information and potential options for patient assistance. Patient is willing to start a PCSK9i. Also discussed lifestyle modifications, such as eating more fruits and vegetables, limiting starchy and high-fat foods, and increasing physical activity by incorporating chair strength exercises.   Current Medications: atorvastatin  80 mg once daily Intolerances: none Risk Factors: age, HTN, T2DM LDL-C goal: <70  Diet:  Breakfast: Yogurt, fruit, crackers Lunch: doesn't eat lunch typically  Dinner: Chicken pie, potatoes, green beans  Beverages: water, 1 glass of unsweet tea with Stevia Exercise:  Limited  with oxygen  use; Does have a chair yoga book that she wants to start using Family History:  Mother: HTN, DM Father: DM, heart disease Paternal grandmother: heart disease, DM Paternal grandfather: heart disease Social History:  Tobacco: Former smoker of 50 years - quit in March 2025 Alcohol: Seldom Labs: Lipid Panel     Component Value Date/Time   CHOL 205 (H) 01/10/2024 1142   CHOL 150 06/05/2023 1140   TRIG 229 (H) 01/10/2024 1142   HDL 36 (L) 01/10/2024 1142   HDL 30 (L) 06/05/2023 1140   CHOLHDL 5.7 01/10/2024 1142   VLDL 46 (H) 01/10/2024 1142   LDLCALC 123 (H) 01/10/2024 1142   LDLCALC 91 06/05/2023 1140   LABVLDL 29 06/05/2023 1140    Past Medical History:  Diagnosis Date   Allergy    seasonal   Arthritis    Atrial flutter with rapid ventricular response (HCC) 12/06/2019   Diabetes mellitus    GERD (gastroesophageal reflux disease)    History of pneumonia    15 years ago   Hyperlipidemia    Hypertension    PE (pulmonary embolism) 12/2015    Current Outpatient Medications on File Prior to Visit  Medication Sig Dispense Refill   acetaminophen  (TYLENOL ) 500 MG tablet Take 1,000 mg by mouth every 6 (six) hours as needed for moderate pain (pain score 4-6).     amLODipine  (NORVASC ) 2.5 MG tablet Take 1 tablet (2.5 mg total) by mouth daily. 90  tablet 3   atorvastatin  (LIPITOR ) 80 MG tablet TAKE 1 TABLET BY MOUTH ONCE DAILY AT  6PM 90 tablet 3   budesonide -glycopyrrolate -formoterol  (BREZTRI  AEROSPHERE) 160-9-4.8 MCG/ACT AERO inhaler Inhale 2 puffs into the lungs 2 (two) times daily. 10.7 g 11   Continuous Glucose Sensor (FREESTYLE LIBRE 3 PLUS SENSOR) MISC Change sensor every 15 days. 2 each 5   Elastic Bandages & Supports (MEDICAL COMPRESSION STOCKINGS) MISC Apply to feet in morning.  Remove at night before sleeping. 2 each 1   empagliflozin  (JARDIANCE ) 25 MG TABS tablet Take 1 tablet (25 mg total) by mouth daily before breakfast. 30 tablet 5   FEROSUL 325 (65 Fe) MG  tablet Take 1 tablet by mouth once daily 30 tablet 0   glimepiride  (AMARYL ) 4 MG tablet Take 1 tablet by mouth twice daily (Patient taking differently: Take 4 mg by mouth daily.) 180 tablet 1   ipratropium (ATROVENT  HFA) 17 MCG/ACT inhaler Inhale 2 puffs into the lungs every 6 (six) hours as needed for wheezing. 1 each 11   Krill Oil CAPS Take 1 capsule by mouth daily.      LANTUS  SOLOSTAR 100 UNIT/ML Solostar Pen INJECT 20 Units in the morning and 15 units in evening (Patient taking differently: INJECT 25 Units in the morning and 20 units in evening) 15 mL 3   metoprolol  (TOPROL -XL) 100 MG 24 hr tablet Take 1 tablet (100 mg total) by mouth daily. 90 tablet 3   MOUNJARO  2.5 MG/0.5ML Pen INJECT 2.5 MG SUBCUTANEOUSLY ONCE A WEEK 4 mL 0   omeprazole  (PRILOSEC) 20 MG capsule Take 20 mg by mouth daily.     pregabalin  (LYRICA ) 75 MG capsule TAKE 1 CAPSULE BY MOUTH THREE TIMES DAILY 90 capsule 2   rivaroxaban  (XARELTO ) 20 MG TABS tablet Take 1 tablet (20 mg total) by mouth daily. 90 tablet 3   sodium zirconium cyclosilicate  (LOKELMA ) 10 g PACK packet Take 10 g by mouth every Monday, Wednesday, and Friday.     tiZANidine  (ZANAFLEX ) 4 MG tablet TAKE 1 TABLET BY MOUTH EVERY 8 HOURS AS NEEDED FOR MUSCLE SPASM 90 tablet 0   No current facility-administered medications on file prior to visit.    Allergies  Allergen Reactions   Albuterol  Anaphylaxis    Throat/lips swelling   Morphine And Codeine Other (See Comments)    Very aggressive and doesn't help pain    Assessment/Plan:  1. Hyperlipidemia -  Hyperlipidemia associated with type 2 diabetes mellitus (HCC) Assessment: Patient's last LDL was 123 on 01/10/24, which is above goal <70  During the time of the last lipid panel, the patient stated she had not taken the atorvastatin  for several week. She has since received a refill and been taking consistently.  Patient is currently on atorvastatin  80 mg daily and is tolerating the medication well with no  reported side effects Patient is willing to start an injectable lipid-lowering medication, such as a PCSK9i Plan: Continue atorvastatin  80 mg once daily Start Repatha  or Praluent once you are notified that it has been approved by your insurance Repeat lipid panel in 3 months Incorporate more fruits and vegetables into your diet and limit starchy, high-fat and fried/greasy foods Increase exercise as tolerated to include chair exercises with weights   Thank you,  B. Amon Rocher, PharmD PGY-1 Pharmacy Resident [facility] 03/22/2024 1:49 PM   Dorsey E. White, Pharm.D [place] Kaitlin Cantrell. [facility] [address], [place], [place] [address]  Phone: [phone]; Fax: [phone]

## 2024-03-22 ENCOUNTER — Other Ambulatory Visit (HOSPITAL_COMMUNITY): Payer: Self-pay

## 2024-03-22 ENCOUNTER — Telehealth: Payer: Self-pay | Admitting: Pharmacy Technician

## 2024-03-22 ENCOUNTER — Telehealth: Payer: Self-pay

## 2024-03-22 ENCOUNTER — Ambulatory Visit: Attending: Internal Medicine

## 2024-03-22 DIAGNOSIS — E1169 Type 2 diabetes mellitus with other specified complication: Secondary | ICD-10-CM | POA: Diagnosis not present

## 2024-03-22 DIAGNOSIS — Z794 Long term (current) use of insulin: Secondary | ICD-10-CM

## 2024-03-22 DIAGNOSIS — E785 Hyperlipidemia, unspecified: Secondary | ICD-10-CM

## 2024-03-22 DIAGNOSIS — E1129 Type 2 diabetes mellitus with other diabetic kidney complication: Secondary | ICD-10-CM

## 2024-03-22 DIAGNOSIS — R809 Proteinuria, unspecified: Secondary | ICD-10-CM

## 2024-03-22 MED ORDER — REPATHA SURECLICK 140 MG/ML ~~LOC~~ SOAJ: 140.0000 mg | SUBCUTANEOUS | 3 refills | Status: AC

## 2024-03-22 NOTE — Telephone Encounter (Signed)
 Please see other encounter.

## 2024-03-22 NOTE — Patient Instructions (Addendum)
 Your Results:             Your most recent labs Goal  Total Cholesterol 205 < 200  Triglycerides 209 < 150  HDL (happy/good cholesterol) 36 > 40  LDL (lousy/bad cholesterol 123 < 70   Medication changes: Continue atorvastatin  80 mg once daily We will start the process to get Repatha  covered by your insurance.  Once the prior authorization is complete, we will call you to let you know and confirm pharmacy information.   You will take Repatha  140 mg injected under the skin every 14 days  Repeat lipid labs in 3 months. We will send you a reminder message closer to time.  Janeal Abadi E. Jamella Grayer, Pharm.D [facility]. [facility] [address], [place], [place] [address] Phone: [phone]; Fax: [phone]     Repatha  is a cholesterol medication that improved your body's ability to get rid of bad cholesterol known as LDL. It can lower your LDL up to 60%. It is an injection that is given under the skin every 2 weeks. The most common side effects of Repathainclude runny nose, symptoms of the common cold, rarely flu or flu-like symptoms, back/muscle pain in about 3-4% of the patients, and redness, pain, or bruising at the injection site.  Repatha  is a cholesterol medication that improved your body's ability to get rid of bad cholesterol known as LDL. It can lower your LDL up to 60%! It is an injection that is given under the skin every 2 weeks. The medication often requires a prior authorization from your insurance company. We will take care of submitting all the necessary information to your insurance company to get it approved. The most common side effects of Repatha  include runny nose, symptoms of the common cold, rarely flu or flu-like symptoms, back/muscle pain in about 3-4% of the patients, and redness, pain, or bruising at the injection site.

## 2024-03-22 NOTE — Telephone Encounter (Signed)
 Pharmacy Patient Advocate Encounter  Received notification from [employer] that Prior Authorization for Repatha  has been APPROVED from 03/22/24 to 09/18/24. Ran test claim, Copay is $0.00- one month. This test claim was processed through [facility]- copay amounts may vary at other pharmacies due to pharmacy/plan contracts, or as the patient moves through the different stages of their insurance plan.   PA #/Case ID/Reference #: [id number]

## 2024-03-22 NOTE — Telephone Encounter (Signed)
 Pharmacy Patient Advocate Encounter   Received notification from Physician's Office that prior authorization for repatha  is required/requested.   Insurance verification completed.   The patient is insured through [facility] [employer]VANTAGE/RX ADVANCE.   Per test claim: PA required; PA submitted to above mentioned insurance via Latent Key/confirmation #/EOC [facility] Status is pending

## 2024-03-22 NOTE — Telephone Encounter (Signed)
 Called patient to let her know that her Repatha  was approved for $0. Prescription has been sent to [facility].

## 2024-03-22 NOTE — Assessment & Plan Note (Addendum)
 Assessment: Patient's last LDL was 123 on 01/10/24, which is above goal <70  During the time of the last lipid panel, the patient stated she had not taken the atorvastatin  for several week. She has since received a refill and been taking consistently.  Patient is currently on atorvastatin  80 mg daily and is tolerating the medication well with no reported side effects Patient is willing to start an injectable lipid-lowering medication, such as a PCSK9i Plan: Continue atorvastatin  80 mg once daily Start Repatha  or Praluent once you are notified that it has been approved by your insurance Repeat lipid panel in 3 months Incorporate more fruits and vegetables into your diet and limit starchy, high-fat and fried/greasy foods Increase exercise as tolerated to include chair exercises with weights

## 2024-04-02 ENCOUNTER — Other Ambulatory Visit: Payer: Self-pay | Admitting: Family Medicine

## 2024-04-02 ENCOUNTER — Other Ambulatory Visit: Payer: Self-pay

## 2024-04-02 DIAGNOSIS — E1169 Type 2 diabetes mellitus with other specified complication: Secondary | ICD-10-CM

## 2024-04-02 DIAGNOSIS — E119 Type 2 diabetes mellitus without complications: Secondary | ICD-10-CM

## 2024-04-02 DIAGNOSIS — E1159 Type 2 diabetes mellitus with other circulatory complications: Secondary | ICD-10-CM

## 2024-04-02 DIAGNOSIS — E1129 Type 2 diabetes mellitus with other diabetic kidney complication: Secondary | ICD-10-CM

## 2024-04-09 ENCOUNTER — Other Ambulatory Visit: Payer: Self-pay | Admitting: Family Medicine

## 2024-04-09 ENCOUNTER — Other Ambulatory Visit: Payer: Self-pay

## 2024-04-09 MED ORDER — MOUNJARO 2.5 MG/0.5ML ~~LOC~~ SOAJ: 2.5000 mg | SUBCUTANEOUS | 0 refills | Status: DC

## 2024-04-09 NOTE — Telephone Encounter (Signed)
 Copied from CRM #[id number]. Topic: Clinical - Medication Refill >> Apr 09, 2024 10:17 AM Fonda T wrote: Medication: MOUNJARO  2.5 MG/0.5ML Pen  Pt requesting refill as soon as possible, no pens left.  Has the patient contacted their pharmacy? Yes, advised to contact office   This is the patient's preferred pharmacy:   [facility] [address] ([address] [place] [address] [place] ([place]) [place] [address] Phone: [phone] Fax: [phone]   Is this the correct pharmacy for this prescription? Yes If no, delete pharmacy and type the correct one.   Has the prescription been filled recently? Yes  Is the patient out of the medication? Yes  Has the patient been seen for an appointment in the last year OR does the patient have an upcoming appointment? Yes  Can we respond through MyChart? No, prefers phone call at [phone]  Agent: Please be advised that Rx refills may take up to 3 business days. We ask that you follow-up with your pharmacy.

## 2024-04-10 ENCOUNTER — Other Ambulatory Visit (HOSPITAL_COMMUNITY): Payer: Self-pay

## 2024-04-10 ENCOUNTER — Telehealth: Payer: Self-pay

## 2024-04-10 NOTE — Telephone Encounter (Signed)
 Pharmacy Patient Advocate Encounter   Received notification from RX Request Messages that prior authorization for Mounjaro  2.5MG /0.5ML auto-injectors  is required/requested.   Insurance verification completed.   The patient is insured through [facility] ADVANTAGE/RX ADVANCE.   Per test claim: Refill too soon. PA is not needed at this time. Medication was filled 04/09/2024. Next eligible fill date is 06/11/2024 Per filled at [facility].

## 2024-05-03 ENCOUNTER — Ambulatory Visit: Admitting: Family Medicine

## 2024-05-03 ENCOUNTER — Encounter: Payer: Self-pay | Admitting: Family Medicine

## 2024-05-03 VITALS — BP 92/66 | HR 91 | Ht 66.0 in | Wt 264.1 lb

## 2024-05-03 DIAGNOSIS — Z794 Long term (current) use of insulin: Secondary | ICD-10-CM

## 2024-05-03 DIAGNOSIS — E1129 Type 2 diabetes mellitus with other diabetic kidney complication: Secondary | ICD-10-CM

## 2024-05-03 DIAGNOSIS — E119 Type 2 diabetes mellitus without complications: Secondary | ICD-10-CM

## 2024-05-03 DIAGNOSIS — E1159 Type 2 diabetes mellitus with other circulatory complications: Secondary | ICD-10-CM | POA: Diagnosis not present

## 2024-05-03 DIAGNOSIS — E785 Hyperlipidemia, unspecified: Secondary | ICD-10-CM

## 2024-05-03 DIAGNOSIS — N183 Chronic kidney disease, stage 3 unspecified: Secondary | ICD-10-CM

## 2024-05-03 DIAGNOSIS — E66813 Obesity, class 3: Secondary | ICD-10-CM | POA: Diagnosis not present

## 2024-05-03 DIAGNOSIS — I4892 Unspecified atrial flutter: Secondary | ICD-10-CM

## 2024-05-03 DIAGNOSIS — I5032 Chronic diastolic (congestive) heart failure: Secondary | ICD-10-CM

## 2024-05-03 DIAGNOSIS — R809 Proteinuria, unspecified: Secondary | ICD-10-CM

## 2024-05-03 DIAGNOSIS — J9611 Chronic respiratory failure with hypoxia: Secondary | ICD-10-CM

## 2024-05-03 DIAGNOSIS — E1169 Type 2 diabetes mellitus with other specified complication: Secondary | ICD-10-CM | POA: Diagnosis not present

## 2024-05-03 DIAGNOSIS — I152 Hypertension secondary to endocrine disorders: Secondary | ICD-10-CM | POA: Diagnosis not present

## 2024-05-03 DIAGNOSIS — J449 Chronic obstructive pulmonary disease, unspecified: Secondary | ICD-10-CM

## 2024-05-03 LAB — POCT GLYCOSYLATED HEMOGLOBIN (HGB A1C): HbA1c POC (<> result, manual entry): 6.8 %

## 2024-05-03 MED ORDER — TIRZEPATIDE 5 MG/0.5ML ~~LOC~~ SOAJ: 5.0000 mg | SUBCUTANEOUS | 1 refills | Status: AC

## 2024-05-03 NOTE — Progress Notes (Signed)
 "  Established Patient Office Visit  Subjective   Patient ID: Kaitlin Cantrell, female    DOB: 1956-04-03  Age: 69 y.o. MRN: [id number]  No chief complaint on file.    History of Present Illness   Kaitlin Cantrell is a 69 year old female with COPD and diabetes who presents for follow-up regarding her oxygen  use and diabetes management.  She experienced three falls on Christmas Eve while trying to gather gifts, attributing the last fall to bending over too long without her oxygen . No dizziness was reported as a cause. She uses oxygen  primarily when stationary due to the cord tangling easily and has purchased a non-medical portable unit that is ineffective. She describes difficulty managing the prescribed oxygen  tank due to its weight and size, impacting her mobility and daily activities. She mentions using a concentrator at home but struggles with the cord tangling, limiting her movement.  Regarding her diabetes, she is disappointed in her weight management over the holidays, noting an increase in weight despite efforts to control her diet. She is currently on the lowest dose of Mounjaro  (2.5 mg). She also takes Jardiance  and glimepiride  once daily, and Lantus  insulin  once daily in the evening. She reports episodes of low blood sugar upon waking, with levels sometimes rising by 20 points after taking her diabetes medication before eating. She has been monitoring her blood sugar with a device on her arm, which she finds difficult to keep in place due to its awkward size and placement. Her A1c has improved to 6.8.      The 10-year ASCVD risk score (Arnett DK, et al., 2019) is: 9.7%  Health Maintenance Due  Topic Date Due   COVID-19 Vaccine (1) Never done   Zoster Vaccines- Shingrix (1 of 2) 12/06/1976   OPHTHALMOLOGY EXAM  03/30/2017   Pneumococcal Vaccine: 50+ Years (2 of 2 - PCV) 06/23/2017   Medicare Annual Wellness (AWV)  12/28/2017      Objective:     BP 92/66   Pulse 91   Ht  5' 6 (1.676 m)   Wt 264 lb 1.9 oz (119.8 kg)   SpO2 99%   BMI 42.63 kg/m    Physical Exam     Gen: alert, oriented Pulm: no respiratory distress. Lctab. On room air Psych: pleasant affect       Results for orders placed or performed in visit on 05/03/24  POCT glycosylated hemoglobin (Hb A1C)  Result Value Ref Range   Hemoglobin A1C     HbA1c POC (<> result, manual entry) 6.8 4.0 - 5.6 %   HbA1c, POC (prediabetic range)     HbA1c, POC (controlled diabetic range)          Assessment & Plan:   Type 2 diabetes mellitus with microalbuminuria, with long-term current use of insulin  (HCC) Assessment & Plan: Blood sugar improved, A1c at 6.8. Mounjaro  less effective, glimepiride  may cause morning hyperglycemia. - Increased Mounjaro  to 5 mg. - Discontinued glimepiride . - Continue Lantus  at 20 units once daily in the evening. Adjust downward as needed.  - Monitor blood sugar levels and adjust insulin  as needed. - Use Tegaderm for continuous glucose monitor placement.  Orders: -     POCT glycosylated hemoglobin (Hb A1C)  Hypertension associated with type 2 diabetes mellitus (HCC) Assessment & Plan: Continue metoprolol .  Bp at goal today  Orders: -     POCT glycosylated hemoglobin (Hb A1C)  Hyperlipidemia associated with type 2 diabetes mellitus (HCC) Assessment & Plan:  Continue atorvastatin  80mg .  Repatha  recently added  Orders: -     POCT glycosylated hemoglobin (Hb A1C)  Chronic respiratory failure with hypoxia (HCC) Assessment & Plan: Oxygen  therapy limited by current tank size, causing mobility issues. Non-medical portable concentrator that she purchased out of pocket is ineffective . Pulmonologist follow-up scheduled. - Discuss oxygen  therapy options with pulmonologist at next appointment. - Consider smaller oxygen  tanks or concentrators that are more portable.   Chronic obstructive pulmonary disease, unspecified COPD type (HCC) Assessment & Plan: Continue  breztri .  Follows w/ pulm regularly   Atrial flutter, unspecified type (HCC) Assessment & Plan: Continue xarelto  and metoprolol    Obesity, class 3 (HCC)  Stage 3 chronic kidney disease, unspecified whether stage 3a or 3b CKD (HCC) Assessment & Plan: Most recent gfr stable at 30.  Continue jardiance .  Continue bp and glucose control.  At goal for both currently   Chronic heart failure with preserved ejection fraction [facility]) Assessment & Plan: No evidence of exacerbation or fluid overload.  Continue current mgmt. Last echo in 2024 was normal.  Taking jardiance  and metoprolol .  Not taking lasix  d/t elevated creatinine. Not taking spironolactone  or entresto  currently d/t hyperkalemia.    Morbid obesity (HCC) Assessment & Plan: Increasing mounjaro  dosing.     Other orders -     Tirzepatide ; Inject 5 mg into the skin once a week.  Dispense: 6 mL; Refill: 1        Return in about 3 months (around 08/01/2024) for DM.    [name], MD  "

## 2024-05-03 NOTE — Patient Instructions (Signed)
" °  VISIT SUMMARY: Today we discussed your oxygen  use and diabetes management. We addressed your recent falls, difficulties with your current oxygen  setup, and your concerns about weight management and blood sugar levels.  YOUR PLAN: TYPE 2 DIABETES MELLITUS WITH DIABETIC KIDNEY COMPLICATION: Your blood sugar levels have improved, but we need to adjust your medications to better manage your morning blood sugar levels. -Increase Mounjaro  to 5 mg. -Stop taking glimepiride . -Continue Lantus  at 20 units once daily in the evening. -Monitor your blood sugar levels and adjust insulin  as needed. -Use Tegaderm to help keep your continuous glucose monitor in place.  CHRONIC RESPIRATORY FAILURE: Your current oxygen  setup is causing mobility issues, and the non-medical portable concentrator is not effective. -Discuss oxygen  therapy options with your pulmonologist at your next appointment. -Consider using smaller oxygen  tanks or concentrators that are easier to carry. -Make sure to use oxygen  during activities that lower your oxygen  levels.   "

## 2024-05-05 DIAGNOSIS — I5032 Chronic diastolic (congestive) heart failure: Secondary | ICD-10-CM | POA: Insufficient documentation

## 2024-05-05 NOTE — Assessment & Plan Note (Signed)
 Continue metoprolol .  Bp at goal today

## 2024-05-05 NOTE — Assessment & Plan Note (Signed)
 Continue atorvastatin  80mg .  Repatha  recently added

## 2024-05-05 NOTE — Assessment & Plan Note (Signed)
 Blood sugar improved, A1c at 6.8. Mounjaro  less effective, glimepiride  may cause morning hyperglycemia. - Increased Mounjaro  to 5 mg. - Discontinued glimepiride . - Continue Lantus  at 20 units once daily in the evening. Adjust downward as needed.  - Monitor blood sugar levels and adjust insulin  as needed. - Use Tegaderm for continuous glucose monitor placement.

## 2024-05-05 NOTE — Assessment & Plan Note (Signed)
 Most recent gfr stable at 30.  Continue jardiance .  Continue bp and glucose control.  At goal for both currently

## 2024-05-05 NOTE — Assessment & Plan Note (Signed)
Continue xarelto and metoprolol

## 2024-05-05 NOTE — Assessment & Plan Note (Signed)
 Increasing mounjaro  dosing.

## 2024-05-05 NOTE — Assessment & Plan Note (Signed)
 Continue breztri .  Follows w/ pulm regularly

## 2024-05-05 NOTE — Assessment & Plan Note (Signed)
 No evidence of exacerbation or fluid overload.  Continue current mgmt. Last echo in 2024 was normal.  Taking jardiance  and metoprolol .  Not taking lasix  d/t elevated creatinine. Not taking spironolactone  or entresto  currently d/t hyperkalemia.

## 2024-05-05 NOTE — Assessment & Plan Note (Signed)
 Oxygen  therapy limited by current tank size, causing mobility issues. Non-medical portable concentrator that she purchased out of pocket is ineffective . Pulmonologist follow-up scheduled. - Discuss oxygen  therapy options with pulmonologist at next appointment. - Consider smaller oxygen  tanks or concentrators that are more portable.

## 2024-05-14 ENCOUNTER — Telehealth (HOSPITAL_COMMUNITY): Payer: Self-pay

## 2024-05-14 NOTE — Telephone Encounter (Signed)
 Advanced Heart Failure Patient Advocate Encounter  The patient was approved for a [employer] grant that will help cover the cost of Jardiance , Metoprolol , Xarelto .  Total amount awarded, $7,500.  Effective: 04/14/2024 - 04/13/2025.  BIN [id number] PCN [id number] Group [id number] ID [id number]  Pharmacy provided with approval and processing information. Patient informed via voicemail and [employer].  Rachel DEL, CPhT Rx Patient Advocate Phone: [phone]

## 2024-05-15 ENCOUNTER — Ambulatory Visit

## 2024-05-15 ENCOUNTER — Ambulatory Visit: Admitting: Emergency Medicine

## 2024-05-15 ENCOUNTER — Encounter: Payer: Self-pay | Admitting: Emergency Medicine

## 2024-05-15 VITALS — BP 134/83 | HR 124 | Temp 97.9°F | Ht 65.0 in | Wt 260.0 lb

## 2024-05-15 DIAGNOSIS — J449 Chronic obstructive pulmonary disease, unspecified: Secondary | ICD-10-CM

## 2024-05-15 DIAGNOSIS — Z87891 Personal history of nicotine dependence: Secondary | ICD-10-CM

## 2024-05-15 DIAGNOSIS — J9611 Chronic respiratory failure with hypoxia: Secondary | ICD-10-CM

## 2024-05-15 DIAGNOSIS — R918 Other nonspecific abnormal finding of lung field: Secondary | ICD-10-CM | POA: Diagnosis not present

## 2024-05-15 DIAGNOSIS — J439 Emphysema, unspecified: Secondary | ICD-10-CM

## 2024-05-15 LAB — PULMONARY FUNCTION TEST
DL/VA % pred: 58 %
DL/VA: 2.41 ml/min/mmHg/L
DLCO unc % pred: 51 %
DLCO unc: 10.87 ml/min/mmHg
FEF 25-75 Pre: 0.99 L/s
FEF2575-%Pred-Pre: 45 %
FEV1-%Pred-Pre: 64 %
FEV1-Pre: 1.66 L
FEV1FVC-%Pred-Pre: 85 %
FEV6-%Pred-Pre: 76 %
FEV6-Pre: 2.47 L
FEV6FVC-%Pred-Pre: 102 %
FVC-%Pred-Pre: 74 %
FVC-Pre: 2.52 L
Pre FEV1/FVC ratio: 66 %
Pre FEV6/FVC Ratio: 98 %
RV % pred: 99 %
RV: 2.19 L
TLC % pred: 90 %
TLC: 4.81 L

## 2024-05-15 NOTE — Patient Instructions (Signed)
 Full pft w/o post performed. Post not performed due to patient allergy

## 2024-05-15 NOTE — Addendum Note (Signed)
 Addended by: MOODY RANCHER on: 05/15/2024 02:08 PM   Modules accepted: Orders

## 2024-05-15 NOTE — Progress Notes (Signed)
 Full pft w/o post performed. Post not performed due to patient allergy

## 2024-05-15 NOTE — Patient Instructions (Addendum)
 We reviewed your pulmonary function testing today. Please continue your Breztri  2 puffs twice a day.  Rinse and gargle after using. Keep your Atrovent  available to use 2 puffs up to every 6 hours if needed for shortness of breath, chest tightness, wheezing. We will repeat your walking oximetry so we can qualify you for a portable oxygen  concentrator. We talked briefly about possibly performing a sleep study but for now we will defer. We reviewed your CT scan of the chest from October.  This was reassuring. We will repeat your screening CT scan of the chest in October 2026. Follow-up in our office in about 5 months Follow-up with Dr. Shelah in October so we can review your CT scan at that time.

## 2024-05-15 NOTE — Assessment & Plan Note (Signed)
 We will repeat your walking oximetry so we can qualify you for a portable oxygen  concentrator..  Question whether there may also be a component of pulmonary hypertension contributing to her hypoxemia.  We talked briefly about possible risk for obstructive sleep apnea.  She wants to defer PSG or CPAP

## 2024-05-15 NOTE — Progress Notes (Addendum)
 "  Subjec heart failure tive:    Patient ID: Kaitlin Cantrell, female    DOB: 15-Oct-1955, 69 y.o.   MRN: [id number]  COPD Her past medical history is significant for COPD.   ROV 05/15/2024 --69 year old woman with a history of COPD, submassive PE (2017) on Xarelto , CHF.  She was seen in July to evaluate her COPD, abnormal CT scan of the chest that showed some waxing and waning pulmonary nodular disease including a new 4 mm left upper lobe nodule and a progressive 7 mm left lower lobe nodule.  She also underwent amatory oximetry and qualified for oxygen  at 2 L/min.  She underwent pulmonary function testing today.  Currently managed on Breztri , Atrovent  as a rescue inhaler, does not tolerate albuterol . She reports that her O2 compliance if marginal - she does use when sleeping, when sitting around. She doesn't walk around with it because she is concerned she will trip over it. Uses her Atrovent  about 1x a day. She has a hoarse voice. Has UA noise when she lays down to sleep.   Pulmonary function testing performed 05/15/2024 and reviewed by me shows moderately severe obstruction with an FEV1 of 1.66 L (64% predicted) bronchodilator was not tested.  Her lung volumes fell into the normal range, question pseudonormalization).  Diffusion capacity was decreased.   CT scan of the chest 02/13/2024 reviewed by me showed no mediastinal or hilar adenopathy, small hiatal hernia, mild centrilobular emphysema and bronchial wall thickening.  The left lower and left upper lobe nodules have resolved.  Her other pulmonary nodules are unchanged.  Recommendation was for a 1 year follow-up, RADS 2 study.    Ambulatory Pulse Oximetry  Row Name 11/29/23 0948      Resting  Resting Heart Rate 52      Resting Sp02 86      Lap 1 (250 feet)  HR 52      02 Sat 86      Lap 2 (250 feet)  HR 55      02 Sat 100      Lap 3 (250 feet)  Tech Comments: pt completed 2 laps droped to 86 and was placed on 2L of O2 and was leveled  at 100 for the whole 2nd lap         Review of Systems As per HPI  Past Medical History:  Diagnosis Date   Allergy    seasonal   Arthritis    Atrial flutter with rapid ventricular response (HCC) 12/06/2019   Diabetes mellitus    GERD (gastroesophageal reflux disease)    History of pneumonia    15 years ago   Hyperlipidemia    Hypertension    PE (pulmonary embolism) 12/2015    Family History  Problem Relation Age of Onset   Colon polyps Father    Diabetes Father        mother   Heart disease Father    Heart murmur Mother    Diabetes Mother    Hypertension Mother    Diabetes Brother    Cancer Maternal Grandmother    Diabetes Paternal Grandmother    Heart disease Paternal Grandmother    COPD Paternal Grandfather    Heart disease Paternal Grandfather    Hypertension Brother    Hypertension Brother      Social History   Socioeconomic History   Marital status: Married    Spouse name: Not on file   Number of children: 0   Years of education:  Not on file   Highest education level: Associate degree: academic program  Occupational History   Occupation: retired  Tobacco Use   Smoking status: Former    Current packs/day: 0.00    Average packs/day: 1 pack/day for 45.0 years (45.0 ttl pk-yrs)    Types: Cigarettes    Quit date: 07/01/2023    Years since quitting: 0.8    Passive exposure: Past   Smokeless tobacco: Never  Vaping Use   Vaping status: Never Used  Substance and Sexual Activity   Alcohol use: Yes    Comment: rarely   Drug use: No   Sexual activity: Not Currently  Other Topics Concern   Not on file  Social History Narrative   Exercise walking daily (varies)   Social Drivers of Health   Tobacco Use: Medium Risk (05/15/2024)   Patient History    Smoking Tobacco Use: Former    Smokeless Tobacco Use: Never    Passive Exposure: Past  Physicist, Medical Strain: Low Risk (01/09/2023)   Overall Financial Resource Strain (CARDIA)    Difficulty of Paying  Living Expenses: Not very hard  Food Insecurity: No Food Insecurity (07/25/2023)   Hunger Vital Sign    Worried About Running Out of Food in the Last Year: Never true    Ran Out of Food in the Last Year: Never true  Transportation Needs: No Transportation Needs (07/25/2023)   PRAPARE - Administrator, Civil Service (Medical): No    Lack of Transportation (Non-Medical): No  Physical Activity: Unknown (01/09/2023)   Exercise Vital Sign    Days of Exercise per Week: 0 days    Minutes of Exercise per Session: Not on file  Stress: No Stress Concern Present (01/09/2023)   [employer] of Occupational Health - Occupational Stress Questionnaire    Feeling of Stress : Only a little  Social Connections: Moderately Isolated (07/18/2023)   Social Connection and Isolation Panel    Frequency of Communication with Friends and Family: More than three times a week    Frequency of Social Gatherings with Friends and Family: More than three times a week    Attends Religious Services: Never    Database Administrator or Organizations: No    Attends Banker Meetings: Never    Marital Status: Married  Catering Manager Violence: Not At Risk (07/25/2023)   Humiliation, Afraid, Rape, and Kick questionnaire    Fear of Current or Ex-Partner: No    Emotionally Abused: No    Physically Abused: No    Sexually Abused: No  Depression (PHQ2-9): Low Risk (05/03/2024)   Depression (PHQ2-9)    PHQ-2 Score: 2  Alcohol Screen: Low Risk (01/09/2023)   Alcohol Screen    Last Alcohol Screening Score (AUDIT): 0  Housing: Low Risk (07/25/2023)   Housing Stability Vital Sign    Unable to Pay for Housing in the Last Year: No    Number of Times Moved in the Last Year: 0    Homeless in the Last Year: No  Utilities: Not At Risk (07/25/2023)   AHC Utilities    Threatened with loss of utilities: No  Health Literacy: Not on file   - Tobacco: Former smoker, 2.0 ppd x 38 years (75 pack-years). 75 pack-year  history - Employment: Film/video editor at [employer] - Partner Status: Married  Allergies  Allergen Reactions   Albuterol  Anaphylaxis    Throat/lips swelling   Morphine And Codeine Other (See Comments)    Very  aggressive and doesn't help pain    Current Outpatient Medications on File Prior to Visit  Medication Sig Dispense Refill   acetaminophen  (TYLENOL ) 500 MG tablet Take 1,000 mg by mouth every 6 (six) hours as needed for moderate pain (pain score 4-6).     amLODipine  (NORVASC ) 2.5 MG tablet Take 1 tablet (2.5 mg total) by mouth daily. 90 tablet 3   atorvastatin  (LIPITOR ) 80 MG tablet TAKE 1 TABLET BY MOUTH ONCE DAILY AT  6PM 90 tablet 3   budesonide -glycopyrrolate -formoterol  (BREZTRI  AEROSPHERE) 160-9-4.8 MCG/ACT AERO inhaler Inhale 2 puffs into the lungs 2 (two) times daily. 10.7 g 11   Continuous Glucose Sensor (FREESTYLE LIBRE 3 PLUS SENSOR) MISC Change sensor every 15 days. 2 each 5   Elastic Bandages & Supports (MEDICAL COMPRESSION STOCKINGS) MISC Apply to feet in morning.  Remove at night before sleeping. 2 each 1   empagliflozin  (JARDIANCE ) 25 MG TABS tablet Take 1 tablet (25 mg total) by mouth daily before breakfast. 30 tablet 5   Evolocumab  (REPATHA  SURECLICK) 140 MG/ML SOAJ Inject 140 mg into the skin every 14 (fourteen) days. 6 mL 3   FEROSUL 325 (65 Fe) MG tablet Take 1 tablet by mouth once daily 30 tablet 0   Krill Oil CAPS Take 1 capsule by mouth daily.      LANTUS  SOLOSTAR 100 UNIT/ML Solostar Pen INJECT 20 Units in the morning and 15 units in evening 15 mL 3   metoprolol  (TOPROL -XL) 100 MG 24 hr tablet Take 1 tablet (100 mg total) by mouth daily. 90 tablet 3   omeprazole  (PRILOSEC) 20 MG capsule Take 20 mg by mouth daily.     pregabalin  (LYRICA ) 75 MG capsule TAKE 1 CAPSULE BY MOUTH THREE TIMES DAILY 90 capsule 2   rivaroxaban  (XARELTO ) 20 MG TABS tablet Take 1 tablet (20 mg total) by mouth daily. 90 tablet 3   sodium zirconium cyclosilicate  (LOKELMA ) 10 g PACK  packet Take 10 g by mouth every Monday, Wednesday, and Friday.     tirzepatide  (MOUNJARO ) 5 MG/0.5ML Pen Inject 5 mg into the skin once a week. 6 mL 1   tiZANidine  (ZANAFLEX ) 4 MG tablet TAKE 1 TABLET BY MOUTH EVERY 8 HOURS AS NEEDED FOR MUSCLE SPASM 90 tablet 2   ipratropium (ATROVENT  HFA) 17 MCG/ACT inhaler Inhale 2 puffs into the lungs every 6 (six) hours as needed for wheezing. (Patient not taking: Reported on 05/15/2024) 1 each 11   No current facility-administered medications on file prior to visit.         Objective:   Physical Exam Vitals:   05/15/24 0943  BP: 134/83  Pulse: 87  Temp: 97.9 F (36.6 C)  TempSrc: Oral  SpO2: 92%  Weight: 260 lb (117.9 kg)  Height: 5' 5 (1.651 m)   Gen: Pleasant, overweight woman, in no distress,  normal affect  ENT: No lesions,  mouth clear,  oropharynx clear, no postnasal drip  Neck: No JVD, no stridor  Lungs: No use of accessory muscles, no crackles or wheezing on normal respiration, no wheeze on forced expiration  Cardiovascular: RRR, heart sounds normal, no murmur or gallops, no peripheral edema  Musculoskeletal: No deformities, no cyanosis or clubbing  Neuro: alert, awake, non focal  Skin: Warm, no lesions or rash       Assessment & Plan:  COPD (chronic obstructive pulmonary disease) (HCC) Pulmonary function testing performed today showed moderately severe obstruction.  She continues to get benefit from the Breztri , uses Atrovent  as needed because  she does not tolerate albuterol .  Vaccinations up-to-date  Chronic respiratory failure with hypoxia (HCC) We will repeat your walking oximetry so we can qualify you for a portable oxygen  concentrator..  Question whether there may also be a component of pulmonary hypertension contributing to her hypoxemia.  We talked briefly about possible risk for obstructive sleep apnea.  She wants to defer PSG or CPAP  Pulmonary nodules The left upper lobe and lower lobe nodules that we had  planned to follow have resolved.  Her other nodules are stable.  She can have another lung cancer screening CT chest at the 1 year mark which would be October 2026.   I personally spent a total of 30 minutes in the care of the patient today including preparing to see the patient, getting/reviewing separately obtained history, performing a medically appropriate exam/evaluation, counseling and educating, placing orders, documenting clinical information in the EHR, independently interpreting results, and communicating results.   Lamar Chris, MD, PhD 05/15/2024, 1:22 PM [facility] [phone] or if no answer before 7:00PM call [phone] For any issues after 7:00PM please call eLink [phone]  Addendum:   Vitals:   05/15/24 1354 05/15/24 1355 05/15/24 1356 05/15/24 1357  BP:      Pulse: (!) 123 Comment: During walk with a rollator (!) 120 Comment: During walk 88 (!) 124  Temp:      Height:      Weight:      SpO2: 95% Comment: During walk with a rollator 96% Comment: During walk (!) 87% Comment: Oxygen  level dropped after 3 lap when sitting 95% Comment: When placed on 2L POC  TempSrc:      BMI (Calculated):         Lamar Chris, MD, PhD 05/29/2024, 4:48 PM [facility] [phone] or if no answer before 7:00PM call [phone] For any issues after 7:00PM please call eLink [phone]   "

## 2024-05-15 NOTE — Assessment & Plan Note (Signed)
 The left upper lobe and lower lobe nodules that we had planned to follow have resolved.  Her other nodules are stable.  She can have another lung cancer screening CT chest at the 1 year mark which would be October 2026.

## 2024-05-15 NOTE — Assessment & Plan Note (Signed)
 Pulmonary function testing performed today showed moderately severe obstruction.  She continues to get benefit from the Breztri , uses Atrovent  as needed because she does not tolerate albuterol .  Vaccinations up-to-date

## 2024-05-17 ENCOUNTER — Other Ambulatory Visit: Payer: Self-pay | Admitting: Family Medicine

## 2024-05-17 NOTE — Telephone Encounter (Signed)
 Copied from CRM [id number]. Topic: Clinical - Medication Refill >> May 17, 2024  1:40 PM Wess S wrote: Medication: empagliflozin  (JARDIANCE ) 25 MG TABS tablet  FEROSUL 325 (65 Fe) MG tablet   Has the patient contacted their pharmacy? No (Agent: If no, request that the patient contact the pharmacy for the refill. If patient does not wish to contact the pharmacy document the reason why and proceed with request.) (Agent: If yes, when and what did the pharmacy advise?)  This is the patient's preferred pharmacy:   [facility] [address] ([address] [place] - [address] [place] ([place]) [place] [address] Phone: [phone] Fax: [phone]  Is this the correct pharmacy for this prescription? Yes If no, delete pharmacy and type the correct one.   Has the prescription been filled recently? Yes  Is the patient out of the medication? Yes  Has the patient been seen for an appointment in the last year OR does the patient have an upcoming appointment? Yes  Can we respond through MyChart? Yes  Agent: Please be advised that Rx refills may take up to 3 business days. We ask that you follow-up with your pharmacy.

## 2024-05-19 MED ORDER — FEROSUL 325 (65 FE) MG PO TABS: 325.0000 mg | ORAL_TABLET | Freq: Every day | ORAL | 0 refills | Status: AC

## 2024-05-23 ENCOUNTER — Telehealth: Payer: Self-pay

## 2024-05-23 NOTE — Telephone Encounter (Signed)
 Copied from CRM #[id number]. Topic: Clinical - Order For Equipment >> May 23, 2024 11:38 AM Rilla NOVAK wrote: Reason for CRM: Patient states she spoke with [employer] and they states they do not have oxygen .  Patient state they have done nothing for her and it is hard getting a hold of them.  What other oxygen  company can she use?  Please call patient @ [phone]    Tried to reach out to patient VM/Lm return call  She needs to reach out to her insurance  and they will provide her with that informations

## 2024-05-24 ENCOUNTER — Telehealth: Payer: Self-pay

## 2024-05-24 NOTE — Telephone Encounter (Signed)
 Copied from CRM #[id number]. Topic: Clinical - Order For Equipment >> May 23, 2024 11:38 AM Rilla NOVAK wrote: Reason for CRM: Patient states she spoke with [employer] and they states they do not have oxygen .  Patient state they have done nothing for her and it is hard getting a hold of them.  What other oxygen  company can she use?  Please call patient @ [phone] >> May 24, 2024  1:33 PM Leila BROCKS wrote: Patient [phone] is returning the office/Que Meneely's call, regarding portable oxygen  concentrator. Patient states [employer] does not portable oxygen  concentrator, there's a wait list and patient needs to get another DME company to get portable oxygen  concentrator. Unable to reach CAL, please call back.    Spoke with patient VBU, she will contact insurance company to see what DME they prefer

## 2024-05-28 ENCOUNTER — Other Ambulatory Visit: Payer: Self-pay

## 2024-05-28 ENCOUNTER — Telehealth: Payer: Self-pay

## 2024-05-28 DIAGNOSIS — J439 Emphysema, unspecified: Secondary | ICD-10-CM

## 2024-05-28 NOTE — Telephone Encounter (Unsigned)
 Copied from CRM [id number]. Topic: Clinical - Order For Equipment >> May 24, 2024  2:37 PM Rilla B wrote: Reason for CRM: Patient has gotten no where with [employer] and the portable oxygen  concentrator.  [employer] has had the order since July.  Patient talked with insurance company and they recommend [facility] ([place]). They did state they can get her set up with one.  Patient would an order to go to [facility] [phone]   FAX: [phone].  *Patient states she perfectly willing to transfer her supplies to [employer].

## 2024-05-29 ENCOUNTER — Telehealth: Payer: Self-pay | Admitting: Emergency Medicine

## 2024-05-29 NOTE — Telephone Encounter (Signed)
 Sent MD phon advise to add sats to OV

## 2024-05-29 NOTE — Telephone Encounter (Signed)
 Per Dolanda at [employer]- sats need to be cosigned or added to md note to be valid for patients insurance they have a medicare advantage plan

## 2024-05-29 NOTE — Telephone Encounter (Signed)
 I have addended the note and signed it.  Please let [employer] know

## 2024-05-30 NOTE — Telephone Encounter (Signed)
 I have informed [employer]. NFN

## 2024-06-05 ENCOUNTER — Other Ambulatory Visit: Payer: Self-pay

## 2024-06-05 MED ORDER — LANTUS SOLOSTAR 100 UNIT/ML ~~LOC~~ SOPN: PEN_INJECTOR | SUBCUTANEOUS | 3 refills | Status: AC

## 2024-06-12 ENCOUNTER — Ambulatory Visit (HOSPITAL_COMMUNITY)

## 2024-07-25 ENCOUNTER — Other Ambulatory Visit

## 2024-08-01 ENCOUNTER — Ambulatory Visit: Admitting: Family Medicine

## 2024-10-10 ENCOUNTER — Ambulatory Visit: Admitting: Emergency Medicine
